# Patient Record
Sex: Male | Born: 1950 | Hispanic: No | Marital: Married | Smoking: Former smoker
Health system: Southern US, Community
[De-identification: ages and names within clinical notes are randomized; demographics above are authoritative.]

## PROBLEM LIST (undated history)

## (undated) DIAGNOSIS — D638 Anemia in other chronic diseases classified elsewhere: Secondary | ICD-10-CM

## (undated) DIAGNOSIS — K219 Gastro-esophageal reflux disease without esophagitis: Secondary | ICD-10-CM

## (undated) DIAGNOSIS — J189 Pneumonia, unspecified organism: Secondary | ICD-10-CM

## (undated) DIAGNOSIS — E7800 Pure hypercholesterolemia, unspecified: Secondary | ICD-10-CM

## (undated) DIAGNOSIS — E119 Type 2 diabetes mellitus without complications: Secondary | ICD-10-CM

## (undated) DIAGNOSIS — I10 Essential (primary) hypertension: Secondary | ICD-10-CM

## (undated) DIAGNOSIS — F0390 Unspecified dementia without behavioral disturbance: Secondary | ICD-10-CM

## (undated) DIAGNOSIS — I5032 Chronic diastolic (congestive) heart failure: Secondary | ICD-10-CM

## (undated) DIAGNOSIS — Z992 Dependence on renal dialysis: Secondary | ICD-10-CM

## (undated) DIAGNOSIS — N186 End stage renal disease: Secondary | ICD-10-CM

## (undated) DIAGNOSIS — M109 Gout, unspecified: Secondary | ICD-10-CM

## (undated) DIAGNOSIS — Z8711 Personal history of peptic ulcer disease: Secondary | ICD-10-CM

## (undated) DIAGNOSIS — Z9289 Personal history of other medical treatment: Secondary | ICD-10-CM

## (undated) DIAGNOSIS — Z8719 Personal history of other diseases of the digestive system: Secondary | ICD-10-CM

## (undated) HISTORY — PX: ARTERIOVENOUS GRAFT PLACEMENT: SUR1029

---

## 1997-11-25 ENCOUNTER — Ambulatory Visit (HOSPITAL_COMMUNITY): Admission: RE | Admit: 1997-11-25 | Discharge: 1997-11-25 | Payer: Self-pay | Admitting: Vascular Surgery

## 1998-11-25 ENCOUNTER — Encounter: Payer: Self-pay | Admitting: Vascular Surgery

## 1998-11-25 ENCOUNTER — Ambulatory Visit (HOSPITAL_COMMUNITY): Admission: RE | Admit: 1998-11-25 | Discharge: 1998-11-25 | Payer: Self-pay | Admitting: Vascular Surgery

## 1999-03-21 ENCOUNTER — Ambulatory Visit (HOSPITAL_COMMUNITY): Admission: RE | Admit: 1999-03-21 | Discharge: 1999-03-21 | Payer: Self-pay | Admitting: Vascular Surgery

## 1999-03-21 ENCOUNTER — Encounter: Payer: Self-pay | Admitting: Vascular Surgery

## 1999-04-13 ENCOUNTER — Encounter: Payer: Self-pay | Admitting: *Deleted

## 1999-04-13 ENCOUNTER — Ambulatory Visit (HOSPITAL_COMMUNITY): Admission: RE | Admit: 1999-04-13 | Discharge: 1999-04-13 | Payer: Self-pay | Admitting: *Deleted

## 2000-03-07 ENCOUNTER — Encounter: Payer: Self-pay | Admitting: Vascular Surgery

## 2000-03-07 ENCOUNTER — Ambulatory Visit (HOSPITAL_COMMUNITY): Admission: RE | Admit: 2000-03-07 | Discharge: 2000-03-07 | Payer: Self-pay | Admitting: Cardiothoracic Surgery

## 2000-03-08 ENCOUNTER — Ambulatory Visit (HOSPITAL_COMMUNITY): Admission: EM | Admit: 2000-03-08 | Discharge: 2000-03-08 | Payer: Self-pay | Admitting: Emergency Medicine

## 2000-03-08 ENCOUNTER — Encounter: Payer: Self-pay | Admitting: Vascular Surgery

## 2000-05-05 ENCOUNTER — Ambulatory Visit (HOSPITAL_COMMUNITY): Admission: RE | Admit: 2000-05-05 | Discharge: 2000-05-05 | Payer: Self-pay | Admitting: Vascular Surgery

## 2000-05-19 ENCOUNTER — Ambulatory Visit (HOSPITAL_COMMUNITY): Admission: RE | Admit: 2000-05-19 | Discharge: 2000-05-19 | Payer: Self-pay | Admitting: Nephrology

## 2000-05-19 ENCOUNTER — Encounter: Payer: Self-pay | Admitting: Nephrology

## 2000-10-26 ENCOUNTER — Emergency Department (HOSPITAL_COMMUNITY): Admission: EM | Admit: 2000-10-26 | Discharge: 2000-10-27 | Payer: Self-pay | Admitting: Emergency Medicine

## 2000-10-27 ENCOUNTER — Encounter: Payer: Self-pay | Admitting: Vascular Surgery

## 2000-10-27 ENCOUNTER — Ambulatory Visit (HOSPITAL_COMMUNITY): Admission: RE | Admit: 2000-10-27 | Discharge: 2000-10-27 | Payer: Self-pay | Admitting: Emergency Medicine

## 2001-02-09 ENCOUNTER — Ambulatory Visit (HOSPITAL_COMMUNITY): Admission: RE | Admit: 2001-02-09 | Discharge: 2001-02-09 | Payer: Self-pay | Admitting: Vascular Surgery

## 2001-02-11 ENCOUNTER — Encounter: Payer: Self-pay | Admitting: Vascular Surgery

## 2001-02-11 ENCOUNTER — Ambulatory Visit (HOSPITAL_COMMUNITY): Admission: RE | Admit: 2001-02-11 | Discharge: 2001-02-11 | Payer: Self-pay | Admitting: Vascular Surgery

## 2001-03-02 ENCOUNTER — Encounter: Payer: Self-pay | Admitting: Vascular Surgery

## 2001-03-02 ENCOUNTER — Ambulatory Visit (HOSPITAL_COMMUNITY): Admission: RE | Admit: 2001-03-02 | Discharge: 2001-03-02 | Payer: Self-pay | Admitting: Vascular Surgery

## 2001-03-13 ENCOUNTER — Ambulatory Visit (HOSPITAL_COMMUNITY): Admission: RE | Admit: 2001-03-13 | Discharge: 2001-03-13 | Payer: Self-pay | Admitting: Vascular Surgery

## 2001-04-27 ENCOUNTER — Ambulatory Visit (HOSPITAL_COMMUNITY): Admission: RE | Admit: 2001-04-27 | Discharge: 2001-04-27 | Payer: Self-pay | Admitting: *Deleted

## 2001-09-25 ENCOUNTER — Ambulatory Visit (HOSPITAL_COMMUNITY): Admission: RE | Admit: 2001-09-25 | Discharge: 2001-09-25 | Payer: Self-pay | Admitting: Internal Medicine

## 2001-12-29 ENCOUNTER — Encounter: Payer: Self-pay | Admitting: *Deleted

## 2001-12-29 ENCOUNTER — Ambulatory Visit (HOSPITAL_COMMUNITY): Admission: RE | Admit: 2001-12-29 | Discharge: 2001-12-29 | Payer: Self-pay | Admitting: *Deleted

## 2002-06-17 HISTORY — PX: KIDNEY TRANSPLANT: SHX239

## 2002-07-16 DIAGNOSIS — Z940 Kidney transplant status: Secondary | ICD-10-CM

## 2005-03-03 ENCOUNTER — Emergency Department: Payer: Self-pay | Admitting: Unknown Physician Specialty

## 2007-05-18 ENCOUNTER — Emergency Department: Payer: Self-pay | Admitting: Emergency Medicine

## 2007-06-01 ENCOUNTER — Ambulatory Visit: Payer: Self-pay

## 2007-07-11 ENCOUNTER — Other Ambulatory Visit: Payer: Self-pay

## 2007-07-11 ENCOUNTER — Inpatient Hospital Stay: Payer: Self-pay | Admitting: Internal Medicine

## 2008-05-14 ENCOUNTER — Inpatient Hospital Stay: Payer: Self-pay | Admitting: Specialist

## 2010-05-20 ENCOUNTER — Inpatient Hospital Stay: Payer: Self-pay | Admitting: Internal Medicine

## 2010-05-23 LAB — PATHOLOGY REPORT

## 2010-08-14 ENCOUNTER — Ambulatory Visit: Payer: Self-pay | Admitting: Unknown Physician Specialty

## 2011-10-21 ENCOUNTER — Ambulatory Visit: Payer: Self-pay | Admitting: Unknown Physician Specialty

## 2012-03-16 ENCOUNTER — Other Ambulatory Visit: Payer: Self-pay | Admitting: Family Medicine

## 2012-03-17 DIAGNOSIS — M109 Gout, unspecified: Secondary | ICD-10-CM | POA: Insufficient documentation

## 2012-03-17 DIAGNOSIS — E785 Hyperlipidemia, unspecified: Secondary | ICD-10-CM | POA: Insufficient documentation

## 2012-03-17 DIAGNOSIS — Z8719 Personal history of other diseases of the digestive system: Secondary | ICD-10-CM | POA: Insufficient documentation

## 2012-08-19 ENCOUNTER — Other Ambulatory Visit: Payer: Self-pay

## 2012-11-02 ENCOUNTER — Other Ambulatory Visit: Payer: Self-pay

## 2012-12-14 ENCOUNTER — Other Ambulatory Visit: Payer: Self-pay

## 2012-12-14 LAB — BASIC METABOLIC PANEL
Anion Gap: 10 (ref 7–16)
BUN: 87 mg/dL — ABNORMAL HIGH (ref 7–18)
Chloride: 102 mmol/L (ref 98–107)
Creatinine: 5.47 mg/dL — ABNORMAL HIGH (ref 0.60–1.30)
Glucose: 63 mg/dL — ABNORMAL LOW (ref 65–99)
Osmolality: 298 (ref 275–301)
Potassium: 3.5 mmol/L (ref 3.5–5.1)
Sodium: 137 mmol/L (ref 136–145)

## 2013-06-17 HISTORY — PX: NEPHRECTOMY TRANSPLANTED ORGAN: SUR880

## 2013-06-17 HISTORY — PX: THROMBECTOMY / ARTERIOVENOUS GRAFT REVISION: SUR1351

## 2013-06-22 ENCOUNTER — Other Ambulatory Visit: Payer: Self-pay

## 2013-08-01 ENCOUNTER — Emergency Department: Payer: Self-pay | Admitting: Emergency Medicine

## 2013-08-05 ENCOUNTER — Observation Stay: Payer: Self-pay | Admitting: Student

## 2013-08-05 LAB — URINALYSIS, COMPLETE
Bacteria: NONE SEEN
Bilirubin,UR: NEGATIVE
GLUCOSE, UR: NEGATIVE mg/dL (ref 0–75)
Hyaline Cast: 3
Ketone: NEGATIVE
LEUKOCYTE ESTERASE: NEGATIVE
Nitrite: NEGATIVE
PH: 5 (ref 4.5–8.0)
Protein: 30
SPECIFIC GRAVITY: 1.01 (ref 1.003–1.030)
Squamous Epithelial: NONE SEEN

## 2013-08-05 LAB — COMPREHENSIVE METABOLIC PANEL
ALK PHOS: 118 U/L — AB
Albumin: 4.5 g/dL (ref 3.4–5.0)
Anion Gap: 10 (ref 7–16)
BILIRUBIN TOTAL: 0.5 mg/dL (ref 0.2–1.0)
BUN: 113 mg/dL — AB (ref 7–18)
CHLORIDE: 101 mmol/L (ref 98–107)
CO2: 24 mmol/L (ref 21–32)
Calcium, Total: 9 mg/dL (ref 8.5–10.1)
Creatinine: 4.96 mg/dL — ABNORMAL HIGH (ref 0.60–1.30)
EGFR (Non-African Amer.): 12 — ABNORMAL LOW
GFR CALC AF AMER: 13 — AB
Glucose: 286 mg/dL — ABNORMAL HIGH (ref 65–99)
Osmolality: 316 (ref 275–301)
POTASSIUM: 4.4 mmol/L (ref 3.5–5.1)
SGOT(AST): 15 U/L (ref 15–37)
SGPT (ALT): 15 U/L (ref 12–78)
SODIUM: 135 mmol/L — AB (ref 136–145)
Total Protein: 8.9 g/dL — ABNORMAL HIGH (ref 6.4–8.2)

## 2013-08-05 LAB — CBC WITH DIFFERENTIAL/PLATELET
BASOS PCT: 0.2 %
Basophil #: 0 10*3/uL (ref 0.0–0.1)
EOS PCT: 0.1 %
Eosinophil #: 0 10*3/uL (ref 0.0–0.7)
HCT: 30.7 % — ABNORMAL LOW (ref 40.0–52.0)
HGB: 9.3 g/dL — ABNORMAL LOW (ref 13.0–18.0)
LYMPHS ABS: 0.7 10*3/uL — AB (ref 1.0–3.6)
Lymphocyte %: 8.3 %
MCH: 21 pg — AB (ref 26.0–34.0)
MCHC: 30.2 g/dL — ABNORMAL LOW (ref 32.0–36.0)
MCV: 70 fL — AB (ref 80–100)
MONO ABS: 0.3 x10 3/mm (ref 0.2–1.0)
Monocyte %: 3.2 %
NEUTROS ABS: 7.4 10*3/uL — AB (ref 1.4–6.5)
Neutrophil %: 88.2 %
Platelet: 275 10*3/uL (ref 150–440)
RBC: 4.42 10*6/uL (ref 4.40–5.90)
RDW: 15.3 % — ABNORMAL HIGH (ref 11.5–14.5)
WBC: 8.4 10*3/uL (ref 3.8–10.6)

## 2013-08-05 LAB — LIPASE, BLOOD: Lipase: 668 U/L — ABNORMAL HIGH (ref 73–393)

## 2013-08-06 LAB — CBC WITH DIFFERENTIAL/PLATELET
BASOS PCT: 0.3 %
Basophil #: 0 10*3/uL (ref 0.0–0.1)
EOS ABS: 0.1 10*3/uL (ref 0.0–0.7)
EOS PCT: 1.8 %
HCT: 24.1 % — ABNORMAL LOW (ref 40.0–52.0)
HGB: 7.6 g/dL — AB (ref 13.0–18.0)
Lymphocyte #: 1.3 10*3/uL (ref 1.0–3.6)
Lymphocyte %: 19.7 %
MCH: 21.8 pg — ABNORMAL LOW (ref 26.0–34.0)
MCHC: 31.4 g/dL — ABNORMAL LOW (ref 32.0–36.0)
MCV: 70 fL — AB (ref 80–100)
MONO ABS: 0.8 x10 3/mm (ref 0.2–1.0)
Monocyte %: 12 %
NEUTROS PCT: 66.2 %
Neutrophil #: 4.5 10*3/uL (ref 1.4–6.5)
Platelet: 211 10*3/uL (ref 150–440)
RBC: 3.47 10*6/uL — ABNORMAL LOW (ref 4.40–5.90)
RDW: 15.5 % — AB (ref 11.5–14.5)
WBC: 6.8 10*3/uL (ref 3.8–10.6)

## 2013-08-06 LAB — BASIC METABOLIC PANEL
Anion Gap: 9 (ref 7–16)
BUN: 106 mg/dL — AB (ref 7–18)
CHLORIDE: 109 mmol/L — AB (ref 98–107)
Calcium, Total: 7.6 mg/dL — ABNORMAL LOW (ref 8.5–10.1)
Co2: 22 mmol/L (ref 21–32)
Creatinine: 4.42 mg/dL — ABNORMAL HIGH (ref 0.60–1.30)
GFR CALC AF AMER: 15 — AB
GFR CALC NON AF AMER: 13 — AB
GLUCOSE: 103 mg/dL — AB (ref 65–99)
Osmolality: 313 (ref 275–301)
Potassium: 3.9 mmol/L (ref 3.5–5.1)
SODIUM: 140 mmol/L (ref 136–145)

## 2013-08-06 LAB — FERRITIN: Ferritin (ARMC): 293 ng/mL (ref 8–388)

## 2013-08-06 LAB — LIPID PANEL
Cholesterol: 103 mg/dL (ref 0–200)
HDL Cholesterol: 32 mg/dL — ABNORMAL LOW (ref 40–60)
Ldl Cholesterol, Calc: 33 mg/dL (ref 0–100)
Triglycerides: 191 mg/dL (ref 0–200)
VLDL Cholesterol, Calc: 38 mg/dL (ref 5–40)

## 2013-08-06 LAB — IRON AND TIBC
IRON BIND. CAP.(TOTAL): 317 ug/dL (ref 250–450)
Iron Saturation: 15 %
Iron: 48 ug/dL — ABNORMAL LOW (ref 65–175)
Unbound Iron-Bind.Cap.: 269 ug/dL

## 2013-08-06 LAB — LIPASE, BLOOD: LIPASE: 493 U/L — AB (ref 73–393)

## 2013-10-26 ENCOUNTER — Inpatient Hospital Stay: Payer: Self-pay | Admitting: Internal Medicine

## 2013-10-26 LAB — URINALYSIS, COMPLETE
BACTERIA: NONE SEEN
Bilirubin,UR: NEGATIVE
GLUCOSE, UR: NEGATIVE mg/dL (ref 0–75)
Hyaline Cast: 2
KETONE: NEGATIVE
Leukocyte Esterase: NEGATIVE
NITRITE: NEGATIVE
PROTEIN: NEGATIVE
Ph: 5 (ref 4.5–8.0)
SPECIFIC GRAVITY: 1.01 (ref 1.003–1.030)
Squamous Epithelial: <1
WBC UR: <1 /HPF (ref 0–5)

## 2013-10-26 LAB — COMPREHENSIVE METABOLIC PANEL
Albumin: 4 g/dL (ref 3.4–5.0)
Alkaline Phosphatase: 63 U/L
Anion Gap: 11 (ref 7–16)
BILIRUBIN TOTAL: 0.5 mg/dL (ref 0.2–1.0)
BUN: 116 mg/dL — ABNORMAL HIGH (ref 7–18)
CALCIUM: 8.6 mg/dL (ref 8.5–10.1)
CHLORIDE: 107 mmol/L (ref 98–107)
Co2: 20 mmol/L — ABNORMAL LOW (ref 21–32)
Creatinine: 5.04 mg/dL — ABNORMAL HIGH (ref 0.60–1.30)
EGFR (African American): 13 — ABNORMAL LOW
EGFR (Non-African Amer.): 11 — ABNORMAL LOW
GLUCOSE: 188 mg/dL — AB (ref 65–99)
Osmolality: 318 (ref 275–301)
Potassium: 3.8 mmol/L (ref 3.5–5.1)
SGOT(AST): 21 U/L (ref 15–37)
SGPT (ALT): 14 U/L (ref 12–78)
Sodium: 138 mmol/L (ref 136–145)
Total Protein: 7.4 g/dL (ref 6.4–8.2)

## 2013-10-26 LAB — OCCULT BLOOD X 1 CARD TO LAB, STOOL: OCCULT BLOOD, FECES: POSITIVE

## 2013-10-26 LAB — CBC WITH DIFFERENTIAL/PLATELET
BASOS PCT: 0.1 %
Basophil #: 0 10*3/uL (ref 0.0–0.1)
EOS ABS: 0 10*3/uL (ref 0.0–0.7)
EOS PCT: 0.3 %
HCT: 25.8 % — ABNORMAL LOW (ref 40.0–52.0)
HGB: 7.9 g/dL — ABNORMAL LOW (ref 13.0–18.0)
LYMPHS ABS: 0.4 10*3/uL — AB (ref 1.0–3.6)
LYMPHS PCT: 5.6 %
MCH: 21.2 pg — ABNORMAL LOW (ref 26.0–34.0)
MCHC: 30.4 g/dL — ABNORMAL LOW (ref 32.0–36.0)
MCV: 70 fL — ABNORMAL LOW (ref 80–100)
Monocyte #: 0.9 x10 3/mm (ref 0.2–1.0)
Monocyte %: 11.1 %
Neutrophil #: 6.4 10*3/uL (ref 1.4–6.5)
Neutrophil %: 82.9 %
PLATELETS: 229 10*3/uL (ref 150–440)
RBC: 3.71 10*6/uL — ABNORMAL LOW (ref 4.40–5.90)
RDW: 16.4 % — ABNORMAL HIGH (ref 11.5–14.5)
WBC: 7.8 10*3/uL (ref 3.8–10.6)

## 2013-10-26 LAB — IRON AND TIBC
IRON SATURATION: 5 %
Iron Bind.Cap.(Total): 297 ug/dL (ref 250–450)
Iron: 16 ug/dL — ABNORMAL LOW (ref 65–175)
Unbound Iron-Bind.Cap.: 281 ug/dL

## 2013-10-26 LAB — CLOSTRIDIUM DIFFICILE(ARMC)

## 2013-10-26 LAB — FOLATE: FOLIC ACID: 14 ng/mL (ref 3.1–100.0)

## 2013-10-26 LAB — LIPASE, BLOOD: LIPASE: 497 U/L — AB (ref 73–393)

## 2013-10-27 LAB — CBC WITH DIFFERENTIAL/PLATELET
Basophil #: 0 10*3/uL (ref 0.0–0.1)
Basophil %: 0.4 %
EOS PCT: 0.3 %
Eosinophil #: 0 10*3/uL (ref 0.0–0.7)
HCT: 23.1 % — AB (ref 40.0–52.0)
HGB: 6.9 g/dL — ABNORMAL LOW (ref 13.0–18.0)
LYMPHS ABS: 0.5 10*3/uL — AB (ref 1.0–3.6)
Lymphocyte %: 8.2 %
MCH: 20.9 pg — AB (ref 26.0–34.0)
MCHC: 30 g/dL — AB (ref 32.0–36.0)
MCV: 70 fL — AB (ref 80–100)
MONO ABS: 1 x10 3/mm (ref 0.2–1.0)
MONOS PCT: 17.2 %
Neutrophil #: 4.3 10*3/uL (ref 1.4–6.5)
Neutrophil %: 73.9 %
Platelet: 199 10*3/uL (ref 150–440)
RBC: 3.31 10*6/uL — AB (ref 4.40–5.90)
RDW: 16.1 % — ABNORMAL HIGH (ref 11.5–14.5)
WBC: 5.8 10*3/uL (ref 3.8–10.6)

## 2013-10-27 LAB — BASIC METABOLIC PANEL
Anion Gap: 9 (ref 7–16)
BUN: 110 mg/dL — ABNORMAL HIGH (ref 7–18)
Calcium, Total: 7.8 mg/dL — ABNORMAL LOW (ref 8.5–10.1)
Chloride: 110 mmol/L — ABNORMAL HIGH (ref 98–107)
Co2: 21 mmol/L (ref 21–32)
Creatinine: 5.09 mg/dL — ABNORMAL HIGH (ref 0.60–1.30)
EGFR (African American): 13 — ABNORMAL LOW
EGFR (Non-African Amer.): 11 — ABNORMAL LOW
GLUCOSE: 146 mg/dL — AB (ref 65–99)
OSMOLALITY: 317 (ref 275–301)
Potassium: 4 mmol/L (ref 3.5–5.1)
Sodium: 140 mmol/L (ref 136–145)

## 2013-10-28 LAB — CBC WITH DIFFERENTIAL/PLATELET
BASOS ABS: 0 10*3/uL (ref 0.0–0.1)
BASOS PCT: 0.3 %
EOS ABS: 0 10*3/uL (ref 0.0–0.7)
Eosinophil %: 0.7 %
HCT: 22.4 % — ABNORMAL LOW (ref 40.0–52.0)
HGB: 6.8 g/dL — ABNORMAL LOW (ref 13.0–18.0)
Lymphocyte #: 0.6 10*3/uL — ABNORMAL LOW (ref 1.0–3.6)
Lymphocyte %: 12.2 %
MCH: 21.3 pg — AB (ref 26.0–34.0)
MCHC: 30.4 g/dL — AB (ref 32.0–36.0)
MCV: 70 fL — ABNORMAL LOW (ref 80–100)
Monocyte #: 0.8 x10 3/mm (ref 0.2–1.0)
Monocyte %: 16.6 %
NEUTROS PCT: 70.2 %
Neutrophil #: 3.3 10*3/uL (ref 1.4–6.5)
Platelet: 182 10*3/uL (ref 150–440)
RBC: 3.21 10*6/uL — ABNORMAL LOW (ref 4.40–5.90)
RDW: 16.4 % — AB (ref 11.5–14.5)
WBC: 4.7 10*3/uL (ref 3.8–10.6)

## 2013-10-28 LAB — BASIC METABOLIC PANEL
ANION GAP: 8 (ref 7–16)
BUN: 100 mg/dL — ABNORMAL HIGH (ref 7–18)
CALCIUM: 7.8 mg/dL — AB (ref 8.5–10.1)
CHLORIDE: 116 mmol/L — AB (ref 98–107)
Co2: 19 mmol/L — ABNORMAL LOW (ref 21–32)
Creatinine: 5.19 mg/dL — ABNORMAL HIGH (ref 0.60–1.30)
EGFR (Non-African Amer.): 11 — ABNORMAL LOW
GFR CALC AF AMER: 13 — AB
Glucose: 125 mg/dL — ABNORMAL HIGH (ref 65–99)
Osmolality: 318 (ref 275–301)
POTASSIUM: 4 mmol/L (ref 3.5–5.1)
Sodium: 143 mmol/L (ref 136–145)

## 2013-10-28 LAB — IRON AND TIBC
IRON BIND. CAP.(TOTAL): 213 ug/dL — AB (ref 250–450)
Iron Saturation: 8 %
Iron: 17 ug/dL — ABNORMAL LOW (ref 65–175)
UNBOUND IRON-BIND. CAP.: 196 ug/dL

## 2013-10-28 LAB — FERRITIN: FERRITIN (ARMC): 209 ng/mL (ref 8–388)

## 2013-10-29 LAB — BASIC METABOLIC PANEL
Anion Gap: 7 (ref 7–16)
BUN: 75 mg/dL — ABNORMAL HIGH (ref 7–18)
CO2: 20 mmol/L — AB (ref 21–32)
Calcium, Total: 8 mg/dL — ABNORMAL LOW (ref 8.5–10.1)
Chloride: 114 mmol/L — ABNORMAL HIGH (ref 98–107)
Creatinine: 4.37 mg/dL — ABNORMAL HIGH (ref 0.60–1.30)
EGFR (African American): 16 — ABNORMAL LOW
GFR CALC NON AF AMER: 13 — AB
GLUCOSE: 157 mg/dL — AB (ref 65–99)
Osmolality: 307 (ref 275–301)
Potassium: 3.9 mmol/L (ref 3.5–5.1)
Sodium: 141 mmol/L (ref 136–145)

## 2013-10-31 LAB — CULTURE, BLOOD (SINGLE)

## 2014-02-15 HISTORY — PX: PERITONEAL CATHETER INSERTION: SHX2223

## 2014-02-28 ENCOUNTER — Ambulatory Visit: Payer: Self-pay | Admitting: Vascular Surgery

## 2014-02-28 DIAGNOSIS — Z01810 Encounter for preprocedural cardiovascular examination: Secondary | ICD-10-CM

## 2014-02-28 DIAGNOSIS — E119 Type 2 diabetes mellitus without complications: Secondary | ICD-10-CM

## 2014-02-28 LAB — BASIC METABOLIC PANEL
Anion Gap: 10 (ref 7–16)
BUN: 113 mg/dL — ABNORMAL HIGH (ref 7–18)
Calcium, Total: 8.1 mg/dL — ABNORMAL LOW (ref 8.5–10.1)
Chloride: 108 mmol/L — ABNORMAL HIGH (ref 98–107)
Co2: 24 mmol/L (ref 21–32)
Creatinine: 4.81 mg/dL — ABNORMAL HIGH (ref 0.60–1.30)
EGFR (African American): 14 — ABNORMAL LOW
EGFR (Non-African Amer.): 12 — ABNORMAL LOW
GLUCOSE: 154 mg/dL — AB (ref 65–99)
Osmolality: 322 (ref 275–301)
POTASSIUM: 3.7 mmol/L (ref 3.5–5.1)
SODIUM: 142 mmol/L (ref 136–145)

## 2014-02-28 LAB — CBC
HCT: 26.4 % — ABNORMAL LOW (ref 40.0–52.0)
HGB: 7.8 g/dL — ABNORMAL LOW (ref 13.0–18.0)
MCH: 21.3 pg — ABNORMAL LOW (ref 26.0–34.0)
MCHC: 29.6 g/dL — ABNORMAL LOW (ref 32.0–36.0)
MCV: 72 fL — AB (ref 80–100)
Platelet: 203 10*3/uL (ref 150–440)
RBC: 3.67 10*6/uL — ABNORMAL LOW (ref 4.40–5.90)
RDW: 16 % — ABNORMAL HIGH (ref 11.5–14.5)
WBC: 5.7 10*3/uL (ref 3.8–10.6)

## 2014-02-28 LAB — URINALYSIS, COMPLETE
Bacteria: NONE SEEN
Bilirubin,UR: NEGATIVE
Glucose,UR: NEGATIVE mg/dL (ref 0–75)
Ketone: NEGATIVE
Leukocyte Esterase: NEGATIVE
Nitrite: NEGATIVE
Ph: 5 (ref 4.5–8.0)
Protein: NEGATIVE
RBC,UR: 1 /HPF (ref 0–5)
SPECIFIC GRAVITY: 1.01 (ref 1.003–1.030)
SQUAMOUS EPITHELIAL: NONE SEEN

## 2014-02-28 LAB — PROTIME-INR
INR: 1.1
PROTHROMBIN TIME: 13.7 s (ref 11.5–14.7)

## 2014-02-28 LAB — MRSA PCR SCREENING

## 2014-03-04 ENCOUNTER — Ambulatory Visit: Payer: Self-pay | Admitting: Vascular Surgery

## 2014-03-10 ENCOUNTER — Other Ambulatory Visit: Payer: Self-pay | Admitting: Nephrology

## 2014-03-10 LAB — POTASSIUM: Potassium: 4.4 mmol/L (ref 3.5–5.1)

## 2014-03-14 ENCOUNTER — Ambulatory Visit: Payer: Self-pay | Admitting: Vascular Surgery

## 2014-03-24 ENCOUNTER — Emergency Department: Payer: Self-pay | Admitting: Emergency Medicine

## 2014-03-24 LAB — COMPREHENSIVE METABOLIC PANEL
Albumin: 3.5 g/dL (ref 3.4–5.0)
Alkaline Phosphatase: 75 U/L
Anion Gap: 12 (ref 7–16)
BUN: 12 mg/dL (ref 7–18)
Bilirubin,Total: 0.6 mg/dL (ref 0.2–1.0)
CHLORIDE: 97 mmol/L — AB (ref 98–107)
CREATININE: 2.6 mg/dL — AB (ref 0.60–1.30)
Calcium, Total: 7.4 mg/dL — ABNORMAL LOW (ref 8.5–10.1)
Co2: 28 mmol/L (ref 21–32)
EGFR (Non-African Amer.): 27 — ABNORMAL LOW
GFR CALC AF AMER: 32 — AB
GLUCOSE: 230 mg/dL — AB (ref 65–99)
OSMOLALITY: 281 (ref 275–301)
Potassium: 3.4 mmol/L — ABNORMAL LOW (ref 3.5–5.1)
SGOT(AST): 19 U/L (ref 15–37)
SGPT (ALT): 15 U/L
Sodium: 137 mmol/L (ref 136–145)
TOTAL PROTEIN: 6.8 g/dL (ref 6.4–8.2)

## 2014-03-24 LAB — CBC WITH DIFFERENTIAL/PLATELET
BASOS PCT: 0.2 %
Basophil #: 0 10*3/uL (ref 0.0–0.1)
Eosinophil #: 0.1 10*3/uL (ref 0.0–0.7)
Eosinophil %: 0.3 %
HCT: 27.9 % — ABNORMAL LOW (ref 40.0–52.0)
HGB: 8.2 g/dL — ABNORMAL LOW (ref 13.0–18.0)
LYMPHS PCT: 3.7 %
Lymphocyte #: 0.7 10*3/uL — ABNORMAL LOW (ref 1.0–3.6)
MCH: 20.9 pg — AB (ref 26.0–34.0)
MCHC: 29.3 g/dL — AB (ref 32.0–36.0)
MCV: 71 fL — ABNORMAL LOW (ref 80–100)
MONOS PCT: 11.3 %
Monocyte #: 2 x10 3/mm — ABNORMAL HIGH (ref 0.2–1.0)
NEUTROS ABS: 14.8 10*3/uL — AB (ref 1.4–6.5)
NEUTROS PCT: 84.5 %
Platelet: 222 10*3/uL (ref 150–440)
RBC: 3.91 10*6/uL — ABNORMAL LOW (ref 4.40–5.90)
RDW: 14.9 % — ABNORMAL HIGH (ref 11.5–14.5)
WBC: 17.5 10*3/uL — AB (ref 3.8–10.6)

## 2014-03-24 LAB — LIPASE, BLOOD: LIPASE: 209 U/L (ref 73–393)

## 2014-03-24 LAB — TROPONIN I: TROPONIN-I: 0.04 ng/mL

## 2014-03-25 DIAGNOSIS — D631 Anemia in chronic kidney disease: Secondary | ICD-10-CM

## 2014-03-25 DIAGNOSIS — R112 Nausea with vomiting, unspecified: Secondary | ICD-10-CM | POA: Insufficient documentation

## 2014-03-25 DIAGNOSIS — N189 Chronic kidney disease, unspecified: Secondary | ICD-10-CM | POA: Insufficient documentation

## 2014-03-25 LAB — TROPONIN I: TROPONIN-I: 0.04 ng/mL

## 2014-03-30 LAB — CULTURE, BLOOD (SINGLE)

## 2014-04-21 ENCOUNTER — Inpatient Hospital Stay: Payer: Self-pay | Admitting: Internal Medicine

## 2014-04-21 LAB — CBC
HCT: 27 % — ABNORMAL LOW (ref 40.0–52.0)
HGB: 8 g/dL — ABNORMAL LOW (ref 13.0–18.0)
MCH: 20.7 pg — ABNORMAL LOW (ref 26.0–34.0)
MCHC: 29.1 g/dL — ABNORMAL LOW (ref 32.0–36.0)
MCV: 71 fL — ABNORMAL LOW (ref 80–100)
Platelet: 244 10*3/uL (ref 150–440)
RBC: 3.82 10*6/uL — ABNORMAL LOW (ref 4.40–5.90)
RDW: 17.6 % — AB (ref 11.5–14.5)
WBC: 25.6 10*3/uL — ABNORMAL HIGH (ref 3.8–10.6)

## 2014-04-21 LAB — MAGNESIUM: Magnesium: 1.5 mg/dL — ABNORMAL LOW

## 2014-04-21 LAB — COMPREHENSIVE METABOLIC PANEL
AST: 22 U/L (ref 15–37)
Albumin: 2.9 g/dL — ABNORMAL LOW (ref 3.4–5.0)
Alkaline Phosphatase: 85 U/L
Anion Gap: 12 (ref 7–16)
BUN: 20 mg/dL — ABNORMAL HIGH (ref 7–18)
Bilirubin,Total: 0.7 mg/dL (ref 0.2–1.0)
CO2: 31 mmol/L (ref 21–32)
Calcium, Total: 8.1 mg/dL — ABNORMAL LOW (ref 8.5–10.1)
Chloride: 94 mmol/L — ABNORMAL LOW (ref 98–107)
Creatinine: 3.3 mg/dL — ABNORMAL HIGH (ref 0.60–1.30)
EGFR (African American): 25 — ABNORMAL LOW
GFR CALC NON AF AMER: 20 — AB
Glucose: 90 mg/dL (ref 65–99)
OSMOLALITY: 276 (ref 275–301)
Potassium: 3.4 mmol/L — ABNORMAL LOW (ref 3.5–5.1)
SGPT (ALT): 17 U/L
SODIUM: 137 mmol/L (ref 136–145)
TOTAL PROTEIN: 7.4 g/dL (ref 6.4–8.2)

## 2014-04-21 LAB — LIPASE, BLOOD: Lipase: 119 U/L (ref 73–393)

## 2014-04-21 LAB — CK TOTAL AND CKMB (NOT AT ARMC)
CK, Total: 48 U/L
CK-MB: <0.5 ng/mL — ABNORMAL LOW (ref 0.5–3.6)

## 2014-04-21 LAB — PHOSPHORUS: Phosphorus: 2.1 mg/dL — ABNORMAL LOW (ref 2.5–4.9)

## 2014-04-21 LAB — TROPONIN I: Troponin-I: 0.03 ng/mL

## 2014-04-22 LAB — BASIC METABOLIC PANEL
ANION GAP: 12 (ref 7–16)
BUN: 37 mg/dL — ABNORMAL HIGH (ref 7–18)
CALCIUM: 7.6 mg/dL — AB (ref 8.5–10.1)
CO2: 29 mmol/L (ref 21–32)
CREATININE: 4.7 mg/dL — AB (ref 0.60–1.30)
Chloride: 96 mmol/L — ABNORMAL LOW (ref 98–107)
EGFR (African American): 16 — ABNORMAL LOW
EGFR (Non-African Amer.): 13 — ABNORMAL LOW
Glucose: 198 mg/dL — ABNORMAL HIGH (ref 65–99)
Osmolality: 288 (ref 275–301)
Potassium: 4 mmol/L (ref 3.5–5.1)
SODIUM: 137 mmol/L (ref 136–145)

## 2014-04-22 LAB — CBC WITH DIFFERENTIAL/PLATELET
Basophil #: 0 10*3/uL (ref 0.0–0.1)
Basophil %: 0.1 %
Eosinophil #: 0 10*3/uL (ref 0.0–0.7)
Eosinophil %: 0 %
HCT: 27.2 % — ABNORMAL LOW (ref 40.0–52.0)
HGB: 8 g/dL — AB (ref 13.0–18.0)
LYMPHS PCT: 5 %
Lymphocyte #: 1.3 10*3/uL (ref 1.0–3.6)
MCH: 20.8 pg — ABNORMAL LOW (ref 26.0–34.0)
MCHC: 29.3 g/dL — ABNORMAL LOW (ref 32.0–36.0)
MCV: 71 fL — ABNORMAL LOW (ref 80–100)
Monocyte #: 0.6 x10 3/mm (ref 0.2–1.0)
Monocyte %: 2.5 %
NEUTROS ABS: 23.8 10*3/uL — AB (ref 1.4–6.5)
NEUTROS PCT: 92.4 %
Platelet: 220 10*3/uL (ref 150–440)
RBC: 3.83 10*6/uL — ABNORMAL LOW (ref 4.40–5.90)
RDW: 17.5 % — AB (ref 11.5–14.5)
WBC: 25.7 10*3/uL — AB (ref 3.8–10.6)

## 2014-04-23 ENCOUNTER — Ambulatory Visit (HOSPITAL_COMMUNITY)
Admission: AD | Admit: 2014-04-23 | Discharge: 2014-04-23 | Disposition: A | Discharge status: Home / Self Care | Payer: BC Managed Care – PPO | Source: Other Acute Inpatient Hospital | Attending: Internal Medicine | Admitting: Internal Medicine

## 2014-04-23 DIAGNOSIS — T8611 Kidney transplant rejection: Secondary | ICD-10-CM | POA: Insufficient documentation

## 2014-04-23 LAB — CBC WITH DIFFERENTIAL/PLATELET
BASOS PCT: 0 %
Basophil #: 0 10*3/uL (ref 0.0–0.1)
EOS PCT: 0 %
Eosinophil #: 0 10*3/uL (ref 0.0–0.7)
HCT: 22.2 % — ABNORMAL LOW (ref 40.0–52.0)
HGB: 6.6 g/dL — ABNORMAL LOW (ref 13.0–18.0)
LYMPHS ABS: 1 10*3/uL (ref 1.0–3.6)
Lymphocyte %: 4.8 %
MCH: 20.5 pg — ABNORMAL LOW (ref 26.0–34.0)
MCHC: 29.8 g/dL — ABNORMAL LOW (ref 32.0–36.0)
MCV: 69 fL — ABNORMAL LOW (ref 80–100)
Monocyte #: 0.6 x10 3/mm (ref 0.2–1.0)
Monocyte %: 2.9 %
NEUTROS PCT: 92.3 %
Neutrophil #: 18.7 10*3/uL — ABNORMAL HIGH (ref 1.4–6.5)
PLATELETS: 224 10*3/uL (ref 150–440)
RBC: 3.23 10*6/uL — ABNORMAL LOW (ref 4.40–5.90)
RDW: 17.4 % — ABNORMAL HIGH (ref 11.5–14.5)
WBC: 20.3 10*3/uL — AB (ref 3.8–10.6)

## 2014-04-23 LAB — RENAL FUNCTION PANEL
Albumin: 2.2 g/dL — ABNORMAL LOW (ref 3.4–5.0)
Anion Gap: 16 (ref 7–16)
BUN: 88 mg/dL — AB (ref 7–18)
CHLORIDE: 97 mmol/L — AB (ref 98–107)
CO2: 23 mmol/L (ref 21–32)
Calcium, Total: 7.1 mg/dL — ABNORMAL LOW (ref 8.5–10.1)
Creatinine: 7.33 mg/dL — ABNORMAL HIGH (ref 0.60–1.30)
EGFR (African American): 10 — ABNORMAL LOW
GFR CALC NON AF AMER: 8 — AB
GLUCOSE: 269 mg/dL — AB (ref 65–99)
OSMOLALITY: 308 (ref 275–301)
Phosphorus: 6.2 mg/dL — ABNORMAL HIGH (ref 2.5–4.9)
Potassium: 3.7 mmol/L (ref 3.5–5.1)
SODIUM: 136 mmol/L (ref 136–145)

## 2014-04-26 LAB — CULTURE, BLOOD (SINGLE)

## 2014-08-16 HISTORY — PX: PERITONEAL CATHETER REMOVAL: SUR1106

## 2014-08-23 ENCOUNTER — Inpatient Hospital Stay: Payer: Self-pay | Admitting: Internal Medicine

## 2014-09-27 LAB — PROTIME-INR
INR: 1
PROTHROMBIN TIME: 13.4 s

## 2014-09-27 LAB — CK TOTAL AND CKMB (NOT AT ARMC)
CK, TOTAL: 42 U/L — AB
CK, Total: 46 U/L — ABNORMAL LOW
CK, Total: 41 U/L — ABNORMAL LOW
CK-MB: 0.4 ng/mL — ABNORMAL LOW
CK-MB: 0.3 ng/mL — AB
CK-MB: 0.5 ng/mL

## 2014-09-27 LAB — CBC
HCT: 24.6 % — AB (ref 40.0–52.0)
HGB: 7.5 g/dL — AB (ref 13.0–18.0)
MCH: 21.4 pg — ABNORMAL LOW (ref 26.0–34.0)
MCHC: 30.5 g/dL — ABNORMAL LOW (ref 32.0–36.0)
MCV: 70 fL — ABNORMAL LOW (ref 80–100)
Platelet: 261 10*3/uL (ref 150–440)
RBC: 3.51 10*6/uL — ABNORMAL LOW (ref 4.40–5.90)
RDW: 21.3 % — ABNORMAL HIGH (ref 11.5–14.5)
WBC: 7.8 10*3/uL (ref 3.8–10.6)

## 2014-09-27 LAB — COMPREHENSIVE METABOLIC PANEL
ALK PHOS: 51 U/L
ANION GAP: 12 (ref 7–16)
AST: 17 U/L
Albumin: 2.9 g/dL — ABNORMAL LOW
BUN: 17 mg/dL
Bilirubin,Total: 0.8 mg/dL
CALCIUM: 9.5 mg/dL
CO2: 32 mmol/L
CREATININE: 8.78 mg/dL — AB
Chloride: 98 mmol/L — ABNORMAL LOW
EGFR (Non-African Amer.): 6 — ABNORMAL LOW
GFR CALC AF AMER: 7 — AB
Glucose: 85 mg/dL
Potassium: 3.4 mmol/L — ABNORMAL LOW
SGPT (ALT): 5 U/L — ABNORMAL LOW
Sodium: 142 mmol/L
Total Protein: 7 g/dL

## 2014-09-27 LAB — TROPONIN I
Troponin-I: 0.06 ng/mL — ABNORMAL HIGH
Troponin-I: 0.06 ng/mL — ABNORMAL HIGH
Troponin-I: 0.06 ng/mL — ABNORMAL HIGH

## 2014-09-27 LAB — LACTIC ACID, PLASMA: Lactic Acid, Venous: 1 mmol/L

## 2014-09-27 LAB — APTT: ACTIVATED PTT: 32.4 s (ref 23.6–35.9)

## 2014-09-28 LAB — CBC WITH DIFFERENTIAL/PLATELET
BASOS ABS: 0.3 10*3/uL — AB (ref 0.0–0.1)
Basophil %: 2.9 %
EOS ABS: 1.9 10*3/uL — AB (ref 0.0–0.7)
Eosinophil %: 19.4 %
HCT: 24.9 % — AB (ref 40.0–52.0)
HGB: 7.8 g/dL — ABNORMAL LOW (ref 13.0–18.0)
LYMPHS PCT: 31 %
Lymphocyte #: 3 10*3/uL (ref 1.0–3.6)
MCH: 21.8 pg — ABNORMAL LOW (ref 26.0–34.0)
MCHC: 31.5 g/dL — AB (ref 32.0–36.0)
MCV: 69 fL — AB (ref 80–100)
Monocyte #: 1.1 x10 3/mm — ABNORMAL HIGH (ref 0.2–1.0)
Monocyte %: 11.5 %
NEUTROS ABS: 3.4 10*3/uL (ref 1.4–6.5)
NEUTROS PCT: 35.2 %
Platelet: 254 10*3/uL (ref 150–440)
RBC: 3.6 10*6/uL — ABNORMAL LOW (ref 4.40–5.90)
RDW: 21.2 % — ABNORMAL HIGH (ref 11.5–14.5)
WBC: 9.8 10*3/uL (ref 3.8–10.6)

## 2014-09-28 LAB — BASIC METABOLIC PANEL
Anion Gap: 9 (ref 7–16)
BUN: 21 mg/dL — ABNORMAL HIGH
CALCIUM: 8.9 mg/dL
CO2: 32 mmol/L
CREATININE: 10.89 mg/dL — AB
Chloride: 100 mmol/L — ABNORMAL LOW
EGFR (African American): 5 — ABNORMAL LOW
EGFR (Non-African Amer.): 4 — ABNORMAL LOW
GLUCOSE: 85 mg/dL
POTASSIUM: 4 mmol/L
Sodium: 141 mmol/L

## 2014-09-28 LAB — PHOSPHORUS: Phosphorus: 3.1 mg/dL

## 2014-09-29 ENCOUNTER — Inpatient Hospital Stay (HOSPITAL_COMMUNITY): Payer: BLUE CROSS/BLUE SHIELD

## 2014-09-29 ENCOUNTER — Encounter (HOSPITAL_COMMUNITY): Payer: Self-pay | Admitting: General Practice

## 2014-09-29 ENCOUNTER — Inpatient Hospital Stay
Admit: 2014-09-29 | Disposition: A | Discharge status: Other Acute Inpatient Hospital | Payer: Self-pay | Attending: Internal Medicine | Admitting: Internal Medicine

## 2014-09-29 ENCOUNTER — Inpatient Hospital Stay (HOSPITAL_COMMUNITY)
Admission: AD | Admit: 2014-09-29 | Discharge: 2014-10-02 | DRG: 246 | Disposition: A | Discharge status: Home Health | Payer: BLUE CROSS/BLUE SHIELD | Source: Other Acute Inpatient Hospital | Attending: Cardiology | Admitting: Cardiology

## 2014-09-29 DIAGNOSIS — R451 Restlessness and agitation: Secondary | ICD-10-CM | POA: Diagnosis not present

## 2014-09-29 DIAGNOSIS — K219 Gastro-esophageal reflux disease without esophagitis: Secondary | ICD-10-CM | POA: Diagnosis present

## 2014-09-29 DIAGNOSIS — I120 Hypertensive chronic kidney disease with stage 5 chronic kidney disease or end stage renal disease: Secondary | ICD-10-CM | POA: Diagnosis present

## 2014-09-29 DIAGNOSIS — E780 Pure hypercholesterolemia: Secondary | ICD-10-CM | POA: Diagnosis present

## 2014-09-29 DIAGNOSIS — A499 Bacterial infection, unspecified: Secondary | ICD-10-CM | POA: Insufficient documentation

## 2014-09-29 DIAGNOSIS — D631 Anemia in chronic kidney disease: Secondary | ICD-10-CM | POA: Diagnosis present

## 2014-09-29 DIAGNOSIS — M1000 Idiopathic gout, unspecified site: Secondary | ICD-10-CM | POA: Diagnosis present

## 2014-09-29 DIAGNOSIS — Z87891 Personal history of nicotine dependence: Secondary | ICD-10-CM | POA: Diagnosis not present

## 2014-09-29 DIAGNOSIS — I25110 Atherosclerotic heart disease of native coronary artery with unstable angina pectoris: Principal | ICD-10-CM | POA: Diagnosis present

## 2014-09-29 DIAGNOSIS — Z7902 Long term (current) use of antithrombotics/antiplatelets: Secondary | ICD-10-CM | POA: Diagnosis not present

## 2014-09-29 DIAGNOSIS — R7881 Bacteremia: Secondary | ICD-10-CM | POA: Diagnosis present

## 2014-09-29 DIAGNOSIS — Z452 Encounter for adjustment and management of vascular access device: Secondary | ICD-10-CM

## 2014-09-29 DIAGNOSIS — R079 Chest pain, unspecified: Secondary | ICD-10-CM | POA: Diagnosis present

## 2014-09-29 DIAGNOSIS — Z91041 Radiographic dye allergy status: Secondary | ICD-10-CM

## 2014-09-29 DIAGNOSIS — I330 Acute and subacute infective endocarditis: Secondary | ICD-10-CM | POA: Diagnosis present

## 2014-09-29 DIAGNOSIS — A491 Streptococcal infection, unspecified site: Secondary | ICD-10-CM | POA: Insufficient documentation

## 2014-09-29 DIAGNOSIS — B955 Unspecified streptococcus as the cause of diseases classified elsewhere: Secondary | ICD-10-CM | POA: Diagnosis present

## 2014-09-29 DIAGNOSIS — E785 Hyperlipidemia, unspecified: Secondary | ICD-10-CM | POA: Diagnosis present

## 2014-09-29 DIAGNOSIS — I339 Acute and subacute endocarditis, unspecified: Secondary | ICD-10-CM | POA: Insufficient documentation

## 2014-09-29 DIAGNOSIS — Z940 Kidney transplant status: Secondary | ICD-10-CM

## 2014-09-29 DIAGNOSIS — Z992 Dependence on renal dialysis: Secondary | ICD-10-CM

## 2014-09-29 DIAGNOSIS — I249 Acute ischemic heart disease, unspecified: Secondary | ICD-10-CM | POA: Diagnosis not present

## 2014-09-29 DIAGNOSIS — R410 Disorientation, unspecified: Secondary | ICD-10-CM | POA: Diagnosis not present

## 2014-09-29 DIAGNOSIS — N186 End stage renal disease: Secondary | ICD-10-CM | POA: Diagnosis present

## 2014-09-29 DIAGNOSIS — E1022 Type 1 diabetes mellitus with diabetic chronic kidney disease: Secondary | ICD-10-CM | POA: Diagnosis present

## 2014-09-29 DIAGNOSIS — Z7982 Long term (current) use of aspirin: Secondary | ICD-10-CM

## 2014-09-29 DIAGNOSIS — Z79899 Other long term (current) drug therapy: Secondary | ICD-10-CM | POA: Diagnosis not present

## 2014-09-29 DIAGNOSIS — R0602 Shortness of breath: Secondary | ICD-10-CM

## 2014-09-29 DIAGNOSIS — E1029 Type 1 diabetes mellitus with other diabetic kidney complication: Secondary | ICD-10-CM | POA: Diagnosis present

## 2014-09-29 DIAGNOSIS — Z794 Long term (current) use of insulin: Secondary | ICD-10-CM | POA: Diagnosis not present

## 2014-09-29 DIAGNOSIS — B9689 Other specified bacterial agents as the cause of diseases classified elsewhere: Secondary | ICD-10-CM | POA: Diagnosis not present

## 2014-09-29 DIAGNOSIS — I39 Endocarditis and heart valve disorders in diseases classified elsewhere: Secondary | ICD-10-CM | POA: Insufficient documentation

## 2014-09-29 DIAGNOSIS — Y649 Contaminated medical or biological substance administered by unspecified means: Secondary | ICD-10-CM | POA: Insufficient documentation

## 2014-09-29 HISTORY — DX: Essential (primary) hypertension: I10

## 2014-09-29 HISTORY — DX: End stage renal disease: N18.6

## 2014-09-29 HISTORY — DX: Anemia in other chronic diseases classified elsewhere: D63.8

## 2014-09-29 HISTORY — DX: Personal history of other medical treatment: Z92.89

## 2014-09-29 HISTORY — DX: Gastro-esophageal reflux disease without esophagitis: K21.9

## 2014-09-29 HISTORY — DX: Personal history of other diseases of the digestive system: Z87.19

## 2014-09-29 HISTORY — PX: CARDIAC CATHETERIZATION: SHX172

## 2014-09-29 HISTORY — DX: Gout, unspecified: M10.9

## 2014-09-29 HISTORY — DX: Pure hypercholesterolemia, unspecified: E78.00

## 2014-09-29 HISTORY — DX: Personal history of peptic ulcer disease: Z87.11

## 2014-09-29 HISTORY — DX: Type 2 diabetes mellitus without complications: E11.9

## 2014-09-29 HISTORY — DX: Dependence on renal dialysis: Z99.2

## 2014-09-29 LAB — PREPARE RBC (CROSSMATCH)

## 2014-09-29 LAB — GLUCOSE, CAPILLARY: Glucose-Capillary: 193 mg/dL — ABNORMAL HIGH (ref 70–99)

## 2014-09-29 LAB — PHOSPHORUS: PHOSPHORUS: 1.6 mg/dL — AB

## 2014-09-29 MED ORDER — SODIUM CHLORIDE 0.9 % IJ SOLN: 3.0000 mL | INTRAMUSCULAR | Status: DC | PRN

## 2014-09-29 MED ORDER — SODIUM CHLORIDE 0.9 % IV SOLN
INTRAVENOUS | Status: DC
  Administered 2014-09-30: 06:00:00 via INTRAVENOUS

## 2014-09-29 MED ORDER — ASPIRIN 81 MG PO CHEW
81.0000 mg | CHEWABLE_TABLET | ORAL | Status: AC
  Administered 2014-09-30: 81 mg via ORAL

## 2014-09-29 MED ORDER — RENA-VITE PO TABS
1.0000 | ORAL_TABLET | Freq: Every day | ORAL | Status: DC
  Administered 2014-09-29 – 2014-10-01 (×3): 1 via ORAL
  Filled 2014-09-29 (×4): qty 1

## 2014-09-29 MED ORDER — CLOPIDOGREL BISULFATE 75 MG PO TABS
300.0000 mg | ORAL_TABLET | Freq: Once | ORAL | Status: AC
  Administered 2014-09-29: 300 mg via ORAL
  Filled 2014-09-29: qty 4

## 2014-09-29 MED ORDER — ONDANSETRON HCL 4 MG/2ML IJ SOLN: 4.0000 mg | Freq: Four times a day (QID) | INTRAMUSCULAR | Status: DC | PRN

## 2014-09-29 MED ORDER — METOPROLOL TARTRATE 25 MG PO TABS
25.0000 mg | ORAL_TABLET | Freq: Two times a day (BID) | ORAL | Status: DC
  Administered 2014-09-29 – 2014-09-30 (×3): 25 mg via ORAL
  Filled 2014-09-29 (×5): qty 1

## 2014-09-29 MED ORDER — NITROGLYCERIN IN D5W 200-5 MCG/ML-% IV SOLN
5.0000 ug/min | INTRAVENOUS | Status: DC
  Administered 2014-09-29: 5 ug/min via INTRAVENOUS
  Filled 2014-09-29: qty 250

## 2014-09-29 MED ORDER — ASPIRIN EC 81 MG PO TBEC
81.0000 mg | DELAYED_RELEASE_TABLET | Freq: Every day | ORAL | Status: DC
  Administered 2014-09-30: 81 mg via ORAL
  Filled 2014-09-29: qty 1

## 2014-09-29 MED ORDER — HEPARIN (PORCINE) IN NACL 100-0.45 UNIT/ML-% IJ SOLN
700.0000 [IU]/h | INTRAMUSCULAR | Status: DC
  Administered 2014-09-30: 700 [IU]/h via INTRAVENOUS
  Filled 2014-09-29: qty 250

## 2014-09-29 MED ORDER — INSULIN ASPART 100 UNIT/ML ~~LOC~~ SOLN
0.0000 [IU] | Freq: Three times a day (TID) | SUBCUTANEOUS | Status: DC
  Administered 2014-09-30 – 2014-10-01 (×2): 1 [IU] via SUBCUTANEOUS
  Administered 2014-10-01 – 2014-10-02 (×2): 2 [IU] via SUBCUTANEOUS

## 2014-09-29 MED ORDER — ASPIRIN 81 MG PO CHEW
324.0000 mg | CHEWABLE_TABLET | ORAL | Status: AC
  Filled 2014-09-29: qty 4

## 2014-09-29 MED ORDER — ACETAMINOPHEN 325 MG PO TABS
650.0000 mg | ORAL_TABLET | ORAL | Status: DC | PRN
  Administered 2014-09-30: 650 mg via ORAL
  Filled 2014-09-29: qty 2

## 2014-09-29 MED ORDER — HEPARIN BOLUS VIA INFUSION
3500.0000 [IU] | Freq: Once | INTRAVENOUS | Status: AC
  Administered 2014-09-30: 3500 [IU] via INTRAVENOUS
  Filled 2014-09-29: qty 3500

## 2014-09-29 MED ORDER — SODIUM CHLORIDE 0.9 % IV SOLN
INTRAVENOUS | Status: DC
  Administered 2014-09-29: 22:00:00 via INTRAVENOUS

## 2014-09-29 MED ORDER — NITROGLYCERIN 0.4 MG SL SUBL: 0.4000 mg | SUBLINGUAL_TABLET | SUBLINGUAL | Status: DC | PRN

## 2014-09-29 MED ORDER — SODIUM CHLORIDE 0.9 % IV SOLN: 250.0000 mL | INTRAVENOUS | Status: DC | PRN

## 2014-09-29 MED ORDER — CALCIUM ACETATE (PHOS BINDER) 667 MG PO CAPS
667.0000 mg | ORAL_CAPSULE | Freq: Three times a day (TID) | ORAL | Status: DC
  Administered 2014-09-30 – 2014-10-02 (×5): 667 mg via ORAL
  Filled 2014-09-29 (×13): qty 1

## 2014-09-29 MED ORDER — PANTOPRAZOLE SODIUM 40 MG PO TBEC
40.0000 mg | DELAYED_RELEASE_TABLET | Freq: Every day | ORAL | Status: DC
  Administered 2014-09-29 – 2014-10-02 (×3): 40 mg via ORAL
  Filled 2014-09-29 (×4): qty 1

## 2014-09-29 MED ORDER — SODIUM CHLORIDE 0.9 % IJ SOLN
3.0000 mL | Freq: Two times a day (BID) | INTRAMUSCULAR | Status: DC
  Administered 2014-09-29: 3 mL via INTRAVENOUS

## 2014-09-29 MED ORDER — ATORVASTATIN CALCIUM 40 MG PO TABS
40.0000 mg | ORAL_TABLET | Freq: Every day | ORAL | Status: DC
  Administered 2014-09-29 – 2014-10-01 (×3): 40 mg via ORAL
  Filled 2014-09-29 (×4): qty 1

## 2014-09-29 MED ORDER — SODIUM CHLORIDE 0.9 % IV SOLN
Freq: Once | INTRAVENOUS | Status: AC
  Administered 2014-09-30: 02:00:00 via INTRAVENOUS

## 2014-09-29 MED ORDER — ASPIRIN 300 MG RE SUPP: 300.0000 mg | RECTAL | Status: AC

## 2014-09-29 MED ORDER — VANCOMYCIN HCL IN DEXTROSE 1-5 GM/200ML-% IV SOLN
1000.0000 mg | Freq: Once | INTRAVENOUS | Status: AC
  Administered 2014-09-29: 1000 mg via INTRAVENOUS
  Filled 2014-09-29: qty 200

## 2014-09-29 NOTE — Progress Notes (Signed)
ANTICOAGULATION CONSULT NOTE - Initial Consult  Pharmacy Consult for Heparin Indication: chest pain/ACS  Allergies not on file  Patient Measurements: Height: 5' (152.4 cm) Weight: 130 lb 8 oz (59.194 kg) IBW/kg (Calculated) : 50 Heparin Dosing Weight: 59 kg  Vital Signs:    Labs: No results for input(s): HGB, HCT, PLT, APTT, LABPROT, INR, HEPARINUNFRC, CREATININE, CKTOTAL, CKMB, TROPONINI in the last 72 hours.  CrCl cannot be calculated (Unknown ideal weight.).   Medical History: No past medical history on file.  Medications:  No prescriptions prior to admission   Scheduled:  . sodium chloride   Intravenous Once  . aspirin  324 mg Oral NOW   Or  . aspirin  300 mg Rectal NOW  . [START ON 09/30/2014] aspirin  81 mg Oral Pre-Cath  . [START ON 09/30/2014] aspirin EC  81 mg Oral Daily  . atorvastatin  40 mg Oral q1800  . [START ON 09/30/2014] calcium acetate  667 mg Oral TID WC  . clopidogrel  300 mg Oral Once  . [START ON 09/30/2014] insulin aspart  0-9 Units Subcutaneous TID WC  . metoprolol tartrate  25 mg Oral BID  . multivitamin  1 tablet Oral QHS  . pantoprazole  40 mg Oral Q0600  . sodium chloride  3 mL Intravenous Q12H   Infusions:  . sodium chloride    . [START ON 09/30/2014] sodium chloride    . nitroGLYCERIN      Assessment: 64yo male with history of HTN, DM2, HLD, ESRD on HD transferred in from [facility] for cath tomorrow. Pharmacy is consulted to dose heparin for ACS/chest pain. Labs are pending  Goal of Therapy:  Heparin level 0.3-0.7 units/ml Monitor platelets by anticoagulation protocol: Yes   Plan:  Give 3500 units bolus x 1 Start heparin infusion at 700 units/hr Check anti-Xa level in 6 hours and daily while on heparin Continue to monitor H&H and platelets  Andrey Cota. Diona Foley, PharmD Clinical Pharmacist Pager [phone] 09/29/2014,8:41 PM

## 2014-09-29 NOTE — H&P (Signed)
[name] is an 64 y.o. male.   Chief Complaint: Chest pain with minimally elevated troponin I HPI: Patient is 64 year old male with past medical history significant for hypertension, non-insulin-dependent diabetes mellitus controlled by diet, hyperlipidemia, end-stage renal disease on hemodialysis, anemia of chronic disease, gouty arthritis, was admitted at [name] on 09/27/2014 because of chest pain which woke him up grade 5/10 radiating to the back associated with shortness of breath diaphoresis and dizziness. Patient was noted to a minimally elevated troponin I subsequently underwent Lexiscan Myoview which showed lateral and inferior wall ischemia with EF of 55% patient subsequently underwent cardiac catheterization today which showed approximately 40% distal left main stenosis and critical ostial LAD stenosis with ruptured plaque and 60-70% proximal LAD stenosis. Patient is transferred here for PCI to LAD. Patient presently denies chest pain but had jaw pain while being transferred from [name]. Denies any shortness of breath. Denies palpitation lightheadedness or syncope. Of note patient had peritoneal dialysis in the past developed peritonitis and subsequently had C. difficile otitis for which he was treated adequately.  No past medical history on file.  No past surgical history on file.  No family history on file. Social History:  has no tobacco, alcohol, and drug history on file.  Allergies: Allergies not on file  No prescriptions prior to admission    No results found for this or any previous visit (from the past 48 hour(s)). No results found.  Review of Systems  Constitutional: Negative for fever and chills.  Eyes: Negative for double vision and photophobia.  Respiratory: Positive for shortness of breath.   Cardiovascular: Positive for chest pain and orthopnea. Negative for palpitations, claudication and leg swelling.  Gastrointestinal:  Negative for nausea and vomiting.  Genitourinary: Negative for dysuria.  Neurological: Negative for dizziness and headaches.    There were no vitals taken for this visit. Physical Exam  Constitutional: He is oriented to person, place, and time.  HENT:  Head: Normocephalic and atraumatic.  Eyes: Conjunctivae are normal. Left eye exhibits no discharge. No scleral icterus.  Neck: Neck supple. No tracheal deviation present. No thyromegaly present.  Cardiovascular: Normal rate and regular rhythm.   Murmur (2/6 systolic murmur noted) heard. Respiratory: Effort normal and breath sounds normal. No respiratory distress. He has no wheezes. He has no rales.  GI: Soft. Bowel sounds are normal. He exhibits no distension. There is no tenderness. There is no rebound.  Musculoskeletal: He exhibits no edema or tenderness.  Neurological: He is alert and oriented to person, place, and time.     Assessment/Plan Acute coronary syndrome Status post left cardiac cath at [name] with critical ostial LAD stenosis with ruptured plaque with TIMI 3 distal flow. Hypertension Diabetes mellitus End-stage renal disease on hemodialysis Hypercholesteremia History of gouty arthritis Anemia of chronic disease Plan As per orders Type and crossmatch and transfuse one unit of packed RBC Discussed with patient and family at length regarding PTCA stenting its risk and benefits i.e. death MI stroke need for emergency CABG local vascular complications etc. and consents for PCI  [name] N 09/29/2014, 8:16 PM

## 2014-09-29 NOTE — Progress Notes (Signed)
ANTIBIOTIC CONSULT NOTE - INITIAL  Pharmacy Consult for Vancomycin Indication: bacteremia  Allergies not on file  Patient Measurements: Height: 5' (152.4 cm) Weight: 130 lb 8 oz (59.194 kg) IBW/kg (Calculated) : 50 Adjusted Body Weight:   Vital Signs:   Intake/Output from previous day:   Intake/Output from this shift:    Labs: No results for input(s): WBC, HGB, PLT, LABCREA, CREATININE in the last 72 hours. CrCl cannot be calculated (Patient has no serum creatinine result on file.). No results for input(s): VANCOTROUGH, VANCOPEAK, VANCORANDOM, GENTTROUGH, GENTPEAK, GENTRANDOM, TOBRATROUGH, TOBRAPEAK, TOBRARND, AMIKACINPEAK, AMIKACINTROU, AMIKACIN in the last 72 hours.   Microbiology: No results found for this or any previous visit (from the past 720 hour(s)).  Medical History: No past medical history on file.  Medications:  No prescriptions prior to admission   Scheduled:  . sodium chloride   Intravenous Once  . aspirin  324 mg Oral NOW   Or  . aspirin  300 mg Rectal NOW  . [START ON 09/30/2014] aspirin  81 mg Oral Pre-Cath  . [START ON 09/30/2014] aspirin EC  81 mg Oral Daily  . atorvastatin  40 mg Oral q1800  . [START ON 09/30/2014] calcium acetate  667 mg Oral TID WC  . clopidogrel  300 mg Oral Once  . [START ON 09/30/2014] insulin aspart  0-9 Units Subcutaneous TID WC  . metoprolol tartrate  25 mg Oral BID  . multivitamin  1 tablet Oral QHS  . pantoprazole  40 mg Oral Q0600  . sodium chloride  3 mL Intravenous Q12H   Infusions:  . sodium chloride    . [START ON 09/30/2014] sodium chloride    . nitroGLYCERIN     Assessment: 64yo male found to [place] in 2/2 blood cultures from [facility]. Pharmacy is consulted to dose vancomycin for bacteremia. Labs are pending.  Goal of Therapy:  Vancomycin trough level 15-20 mcg/ml  Plan:  Vancomycin 1g IV once Redose antibiotics when labs return Measure antibiotic drug levels at steady state Follow up culture results, renal  function, and clinical course  Andrey Cota. Diona Foley, PharmD Clinical Pharmacist Pager [phone] 09/29/2014,8:52 PM

## 2014-09-30 ENCOUNTER — Ambulatory Visit (HOSPITAL_COMMUNITY): Admission: AD | Admit: 2014-09-30 | Payer: Self-pay | Source: Ambulatory Visit | Admitting: Cardiology

## 2014-09-30 ENCOUNTER — Inpatient Hospital Stay (HOSPITAL_COMMUNITY): Payer: BLUE CROSS/BLUE SHIELD

## 2014-09-30 ENCOUNTER — Encounter (HOSPITAL_COMMUNITY)
Admission: AD | Disposition: A | Discharge status: Home Health | Payer: Medicare Other | Source: Other Acute Inpatient Hospital | Attending: Cardiology

## 2014-09-30 ENCOUNTER — Encounter (HOSPITAL_COMMUNITY): Payer: Self-pay | Admitting: Cardiology

## 2014-09-30 DIAGNOSIS — E1029 Type 1 diabetes mellitus with other diabetic kidney complication: Secondary | ICD-10-CM | POA: Insufficient documentation

## 2014-09-30 DIAGNOSIS — B9689 Other specified bacterial agents as the cause of diseases classified elsewhere: Secondary | ICD-10-CM

## 2014-09-30 DIAGNOSIS — I330 Acute and subacute infective endocarditis: Secondary | ICD-10-CM

## 2014-09-30 DIAGNOSIS — A499 Bacterial infection, unspecified: Secondary | ICD-10-CM | POA: Insufficient documentation

## 2014-09-30 DIAGNOSIS — N186 End stage renal disease: Secondary | ICD-10-CM | POA: Insufficient documentation

## 2014-09-30 DIAGNOSIS — R7881 Bacteremia: Secondary | ICD-10-CM

## 2014-09-30 DIAGNOSIS — I39 Endocarditis and heart valve disorders in diseases classified elsewhere: Secondary | ICD-10-CM | POA: Insufficient documentation

## 2014-09-30 DIAGNOSIS — R0602 Shortness of breath: Secondary | ICD-10-CM | POA: Insufficient documentation

## 2014-09-30 DIAGNOSIS — I249 Acute ischemic heart disease, unspecified: Secondary | ICD-10-CM

## 2014-09-30 DIAGNOSIS — I339 Acute and subacute endocarditis, unspecified: Secondary | ICD-10-CM | POA: Insufficient documentation

## 2014-09-30 DIAGNOSIS — A491 Streptococcal infection, unspecified site: Secondary | ICD-10-CM | POA: Insufficient documentation

## 2014-09-30 DIAGNOSIS — Z992 Dependence on renal dialysis: Secondary | ICD-10-CM

## 2014-09-30 HISTORY — PX: PERCUTANEOUS CORONARY STENT INTERVENTION (PCI-S): SHX5485

## 2014-09-30 LAB — TROPONIN I
Troponin I: 0.05 ng/mL — ABNORMAL HIGH (ref <0.031)
Troponin I: 0.06 ng/mL — ABNORMAL HIGH (ref <0.031)
Troponin I: 0.06 ng/mL — ABNORMAL HIGH (ref <0.031)

## 2014-09-30 LAB — CBC WITH DIFFERENTIAL/PLATELET
BASOS ABS: 0 10*3/uL (ref 0.0–0.1)
Basophils Relative: 0 % (ref 0–1)
Eosinophils Absolute: 0 10*3/uL (ref 0.0–0.7)
Eosinophils Relative: 0 % (ref 0–5)
HCT: 26.4 % — ABNORMAL LOW (ref 39.0–52.0)
Hemoglobin: 8 g/dL — ABNORMAL LOW (ref 13.0–17.0)
LYMPHS ABS: 1.4 10*3/uL (ref 0.7–4.0)
LYMPHS PCT: 32 % (ref 12–46)
MCH: 21.2 pg — ABNORMAL LOW (ref 26.0–34.0)
MCHC: 30.3 g/dL (ref 30.0–36.0)
MCV: 70 fL — ABNORMAL LOW (ref 78.0–100.0)
MONOS PCT: 2 % — AB (ref 3–12)
Monocytes Absolute: 0.1 10*3/uL (ref 0.1–1.0)
Neutro Abs: 2.9 10*3/uL (ref 1.7–7.7)
Neutrophils Relative %: 66 % (ref 43–77)
PLATELETS: 238 10*3/uL (ref 150–400)
RBC: 3.77 MIL/uL — ABNORMAL LOW (ref 4.22–5.81)
RDW: 20.3 % — AB (ref 11.5–15.5)
WBC: 4.4 10*3/uL (ref 4.0–10.5)

## 2014-09-30 LAB — LIPID PANEL
CHOL/HDL RATIO: 3.8 ratio
Cholesterol: 115 mg/dL (ref 0–200)
HDL: 30 mg/dL — AB (ref >39)
LDL CALC: 69 mg/dL (ref 0–99)
TRIGLYCERIDES: 79 mg/dL (ref <150)
VLDL: 16 mg/dL (ref 0–40)

## 2014-09-30 LAB — CBC
HCT: 28.9 % — ABNORMAL LOW (ref 39.0–52.0)
HCT: 30.2 % — ABNORMAL LOW (ref 39.0–52.0)
Hemoglobin: 9.1 g/dL — ABNORMAL LOW (ref 13.0–17.0)
Hemoglobin: 9.6 g/dL — ABNORMAL LOW (ref 13.0–17.0)
MCH: 22.4 pg — AB (ref 26.0–34.0)
MCH: 22.4 pg — ABNORMAL LOW (ref 26.0–34.0)
MCHC: 31.5 g/dL (ref 30.0–36.0)
MCHC: 31.8 g/dL (ref 30.0–36.0)
MCV: 71 fL — ABNORMAL LOW (ref 78.0–100.0)
MCV: 70.6 fL — ABNORMAL LOW (ref 78.0–100.0)
PLATELETS: 233 10*3/uL (ref 150–400)
Platelets: 237 10*3/uL (ref 150–400)
RBC: 4.07 MIL/uL — AB (ref 4.22–5.81)
RBC: 4.28 MIL/uL (ref 4.22–5.81)
RDW: 19.7 % — ABNORMAL HIGH (ref 11.5–15.5)
RDW: 19.6 % — ABNORMAL HIGH (ref 11.5–15.5)
WBC: 5.1 10*3/uL (ref 4.0–10.5)
WBC: 5.3 10*3/uL (ref 4.0–10.5)

## 2014-09-30 LAB — BASIC METABOLIC PANEL
Anion gap: 17 — ABNORMAL HIGH (ref 5–15)
BUN: 20 mg/dL (ref 6–23)
CALCIUM: 8.6 mg/dL (ref 8.4–10.5)
CO2: 25 mmol/L (ref 19–32)
CREATININE: 4.78 mg/dL — AB (ref 0.50–1.35)
Chloride: 92 mmol/L — ABNORMAL LOW (ref 96–112)
GFR calc non Af Amer: 12 mL/min — ABNORMAL LOW (ref >90)
GFR, EST AFRICAN AMERICAN: 14 mL/min — AB (ref >90)
GLUCOSE: 152 mg/dL — AB (ref 70–99)
Potassium: 4.6 mmol/L (ref 3.5–5.1)
Sodium: 134 mmol/L — ABNORMAL LOW (ref 135–145)

## 2014-09-30 LAB — COMPREHENSIVE METABOLIC PANEL
ALK PHOS: 58 U/L (ref 39–117)
ALT: 8 U/L (ref 0–53)
AST: 15 U/L (ref 0–37)
Albumin: 2.8 g/dL — ABNORMAL LOW (ref 3.5–5.2)
Anion gap: 20 — ABNORMAL HIGH (ref 5–15)
BILIRUBIN TOTAL: 1.5 mg/dL — AB (ref 0.3–1.2)
BUN: 11 mg/dL (ref 6–23)
CO2: 21 mmol/L (ref 19–32)
CREATININE: 4.09 mg/dL — AB (ref 0.50–1.35)
Calcium: 8.4 mg/dL (ref 8.4–10.5)
Chloride: 93 mmol/L — ABNORMAL LOW (ref 96–112)
GFR calc Af Amer: 16 mL/min — ABNORMAL LOW (ref >90)
GFR calc non Af Amer: 14 mL/min — ABNORMAL LOW (ref >90)
GLUCOSE: 194 mg/dL — AB (ref 70–99)
POTASSIUM: 4.6 mmol/L (ref 3.5–5.1)
Sodium: 134 mmol/L — ABNORMAL LOW (ref 135–145)
TOTAL PROTEIN: 7.7 g/dL (ref 6.0–8.3)

## 2014-09-30 LAB — POCT ACTIVATED CLOTTING TIME: ACTIVATED CLOTTING TIME: 356 s

## 2014-09-30 LAB — PROTIME-INR
INR: 1.21 (ref 0.00–1.49)
Prothrombin Time: 15.4 seconds — ABNORMAL HIGH (ref 11.6–15.2)

## 2014-09-30 LAB — GLUCOSE, CAPILLARY: GLUCOSE-CAPILLARY: 139 mg/dL — AB (ref 70–99)

## 2014-09-30 LAB — ABO/RH: ABO/RH(D): O POS

## 2014-09-30 LAB — MRSA PCR SCREENING: MRSA by PCR: NEGATIVE

## 2014-09-30 SURGERY — PERCUTANEOUS CORONARY STENT INTERVENTION (PCI-S)
Anesthesia: LOCAL

## 2014-09-30 MED ORDER — ACETAMINOPHEN 325 MG PO TABS: 650.0000 mg | ORAL_TABLET | ORAL | Status: DC | PRN

## 2014-09-30 MED ORDER — ONDANSETRON HCL 4 MG/2ML IJ SOLN: 4.0000 mg | Freq: Four times a day (QID) | INTRAMUSCULAR | Status: DC | PRN

## 2014-09-30 MED ORDER — LORAZEPAM 0.5 MG PO TABS
0.5000 mg | ORAL_TABLET | Freq: Three times a day (TID) | ORAL | Status: DC | PRN
  Administered 2014-09-30: 0.5 mg via ORAL
  Filled 2014-09-30: qty 1

## 2014-09-30 MED ORDER — NITROGLYCERIN 0.2 MG/ML ON CALL CATH LAB
INTRAVENOUS | Status: AC
  Filled 2014-09-30: qty 1

## 2014-09-30 MED ORDER — ATROPINE SULFATE 0.1 MG/ML IJ SOLN
INTRAMUSCULAR | Status: AC
  Filled 2014-09-30: qty 10

## 2014-09-30 MED ORDER — SODIUM CHLORIDE 0.9 % IV SOLN
INTRAVENOUS | Status: DC
  Administered 2014-09-30: 14:00:00 via INTRAVENOUS

## 2014-09-30 MED ORDER — METHYLPREDNISOLONE SODIUM SUCC 125 MG IJ SOLR
INTRAMUSCULAR | Status: AC
  Filled 2014-09-30: qty 2

## 2014-09-30 MED ORDER — LORAZEPAM 2 MG/ML IJ SOLN
INTRAMUSCULAR | Status: AC
  Filled 2014-09-30: qty 1

## 2014-09-30 MED ORDER — CEFTRIAXONE SODIUM IN DEXTROSE 40 MG/ML IV SOLN
2.0000 g | INTRAVENOUS | Status: DC
  Filled 2014-09-30: qty 50

## 2014-09-30 MED ORDER — DIPHENHYDRAMINE HCL 50 MG/ML IJ SOLN
INTRAMUSCULAR | Status: AC
  Filled 2014-09-30: qty 1

## 2014-09-30 MED ORDER — BIVALIRUDIN 250 MG IV SOLR
INTRAVENOUS | Status: AC
  Filled 2014-09-30: qty 250

## 2014-09-30 MED ORDER — HEPARIN (PORCINE) IN NACL 2-0.9 UNIT/ML-% IJ SOLN
INTRAMUSCULAR | Status: AC
  Filled 2014-09-30: qty 1000

## 2014-09-30 MED ORDER — LINEZOLID 600 MG PO TABS
600.0000 mg | ORAL_TABLET | Freq: Two times a day (BID) | ORAL | Status: DC
  Filled 2014-09-30 (×2): qty 1

## 2014-09-30 MED ORDER — CLOPIDOGREL BISULFATE 75 MG PO TABS
ORAL_TABLET | ORAL | Status: AC
  Filled 2014-09-30: qty 2

## 2014-09-30 MED ORDER — CLOPIDOGREL BISULFATE 75 MG PO TABS
75.0000 mg | ORAL_TABLET | Freq: Every day | ORAL | Status: DC
  Administered 2014-10-01 – 2014-10-02 (×2): 75 mg via ORAL
  Filled 2014-09-30 (×3): qty 1

## 2014-09-30 MED ORDER — VANCOMYCIN HCL 500 MG IV SOLR
500.0000 mg | INTRAVENOUS | Status: DC
  Administered 2014-10-01: 500 mg via INTRAVENOUS
  Filled 2014-09-30 (×2): qty 500

## 2014-09-30 MED ORDER — LORAZEPAM 2 MG/ML IJ SOLN
1.0000 mg | Freq: Four times a day (QID) | INTRAMUSCULAR | Status: DC | PRN
  Administered 2014-09-30: 1 mg via INTRAVENOUS
  Filled 2014-09-30: qty 1

## 2014-09-30 MED ORDER — CEFTRIAXONE SODIUM IN DEXTROSE 40 MG/ML IV SOLN
2.0000 g | INTRAVENOUS | Status: DC
  Administered 2014-09-30: 2 g via INTRAVENOUS
  Filled 2014-09-30 (×2): qty 50

## 2014-09-30 MED ORDER — LIDOCAINE HCL (PF) 1 % IJ SOLN
INTRAMUSCULAR | Status: AC
  Filled 2014-09-30: qty 30

## 2014-09-30 MED ORDER — FAMOTIDINE IN NACL 20-0.9 MG/50ML-% IV SOLN
INTRAVENOUS | Status: AC
  Filled 2014-09-30: qty 50

## 2014-09-30 MED ORDER — LORAZEPAM 2 MG/ML IJ SOLN: 1.0000 mg | Freq: Four times a day (QID) | INTRAMUSCULAR | Status: DC

## 2014-09-30 MED ORDER — ASPIRIN 81 MG PO CHEW
81.0000 mg | CHEWABLE_TABLET | Freq: Every day | ORAL | Status: DC
  Administered 2014-10-01 – 2014-10-02 (×2): 81 mg via ORAL
  Filled 2014-09-30 (×2): qty 1

## 2014-09-30 NOTE — CV Procedure (Signed)
PTCA stenting report dictated on 09/30/2014 dictation number is [id number]

## 2014-09-30 NOTE — Consult Note (Signed)
[facility]    Date of Admission:  09/29/2014  Date of Consult:  09/30/2014  Reason for Consult: streptococcal bacteremia Referring Physician: Dr. Sharyn Lull   HPI: [name] is an 64 y.o. male w ESRD on HD, DM with renal complications who presented to [facility] with chest pressure, found to have + troponins, who had blood cultures drawn at [facility] --> growing Viridans Streptococci + possibly two additional streptococcal species from 2/2/ blood cultures on 09/27/14 (drawn in the ED) who underwent cardiac cath 40% distal left main stenosis and critical ostial LAD stenosis with ruptured plaque and 60-70% proximal LAD stenosis. Patient is transferred here for PCI to LAD.   He was found to have murmur as well and was taken to cath lab for stenting PTCA today.   He receives HD via AV LUE graft (previously had been on PD but developed Peritonitis complicated by clostridium difficile colitis)       Past Medical History  Diagnosis Date  . Chronic disease anemia   . Hypertension   . High cholesterol   . Type II diabetes mellitus   . History of blood transfusion     "related to anemia"  . GERD (gastroesophageal reflux disease)   . History of stomach ulcers   . Gout   . ESRD (end stage renal disease) on dialysis     "[facility] [place]; [name]" (09/29/2014)    Past Surgical History  Procedure Laterality Date  . Kidney transplant Right 2004  . Peritoneal catheter insertion  02/2014  . Peritoneal catheter removal  08/2014  . Arteriovenous graft placement Left ~ 1996  . Thrombectomy / arteriovenous graft revision  2015  . Nephrectomy transplanted organ  2015  . Cardiac catheterization  09/29/2014    "[place]"  ergies:   Allergies  Allergen Reactions  . Ivp Dye [Iodinated Diagnostic Agents]      Medications: I have reviewed patients current medications as documented in Epic Anti-infectives    Start     Dose/Rate Route Frequency Ordered Stop   10/01/14 1200   vancomycin (VANCOCIN) 500 mg in sodium chloride 0.9 % 100 mL IVPB     500 mg 100 mL/hr over 60 Minutes Intravenous Every T-Th-Sa (Hemodialysis) 09/30/14 0058     09/30/14 1000  linezolid (ZYVOX) tablet 600 mg  Status:  Discontinued     600 mg Oral Every 12 hours 09/30/14 0947 09/30/14 1038   09/30/14 0900  cefTRIAXone (ROCEPHIN) 2 g in dextrose 5 % 50 mL IVPB - Premix  Status:  Discontinued     2 g 100 mL/hr over 30 Minutes Intravenous Every 24 hours 09/30/14 0808 09/30/14 1105   09/29/14 2100  vancomycin (VANCOCIN) IVPB 1000 mg/200 mL premix     1,000 mg 200 mL/hr over 60 Minutes Intravenous  Once 09/29/14 2056 09/29/14 2329      Social History:  reports that he has quit smoking. His smoking use included Cigarettes. He has never used smokeless tobacco. He reports that he does not drink alcohol or use illicit drugs.  History reviewed. No pertinent family history.  As in HPI and primary teams notes otherwise 12 point review of systems is negative  Blood pressure 148/69, pulse 63, temperature 98.5 F (36.9 C), temperature source Axillary, resp. rate 17, height 5' (1.524 m), weight 135 lb 1.6 oz (61.281 kg), SpO2 100 %. General: Alert and awake, oriented x3, not in any acute distress. HEENT: anicteric sclera, pupils reactive to light and accommodation, EOMI, oropharynx  clear and without exudate CVS regular rate, normal r,  2/6 systolic murmur Chest: clear to auscultation bilaterally, no wheezing, rales or rhonchi Abdomen: soft nontender, nondistended, normal bowel sounds, Extremities: no  clubbing or edema noted bilaterally Skin:    Neuro: nonfocal, strength and sensation intact   Results for orders placed or performed during the hospital encounter of 09/29/14 (from the past 48 hour(s))  Prepare RBC     Status: None   Collection Time: 09/29/14  8:41 PM  Result Value Ref Range   Order Confirmation ORDER PROCESSED BY BLOOD BANK   Comprehensive metabolic panel     Status: Abnormal    Collection Time: 09/29/14 11:10 PM  Result Value Ref Range   Sodium 134 (L) 135 - 145 mmol/L   Potassium 4.6 3.5 - 5.1 mmol/L   Chloride 93 (L) 96 - 112 mmol/L   CO2 21 19 - 32 mmol/L   Glucose, Bld 194 (H) 70 - 99 mg/dL   BUN 11 6 - 23 mg/dL   Creatinine, Ser 4.09 (H) 0.50 - 1.35 mg/dL   Calcium 8.4 8.4 - 10.5 mg/dL   Total Protein 7.7 6.0 - 8.3 g/dL   Albumin 2.8 (L) 3.5 - 5.2 g/dL   AST 15 0 - 37 U/L   ALT 8 0 - 53 U/L   Alkaline Phosphatase 58 39 - 117 U/L   Total Bilirubin 1.5 (H) 0.3 - 1.2 mg/dL   GFR calc non Af Amer 14 (L) >90 mL/min   GFR calc Af Amer 16 (L) >90 mL/min    Comment: (NOTE) The eGFR has been calculated using the CKD EPI equation. This calculation has not been validated in all clinical situations. eGFR's persistently <90 mL/min signify possible Chronic Kidney Disease.    Anion gap 20 (H) 5 - 15  Troponin I-(serum)     Status: Abnormal   Collection Time: 09/29/14 11:10 PM  Result Value Ref Range   Troponin I 0.05 (H) <0.031 ng/mL    Comment:        PERSISTENTLY INCREASED TROPONIN VALUES IN THE RANGE OF 0.04-0.49 ng/mL CAN BE SEEN IN:       -UNSTABLE ANGINA       -CONGESTIVE HEART FAILURE       -MYOCARDITIS       -CHEST TRAUMA       -ARRYHTHMIAS       -LATE PRESENTING MYOCARDIAL INFARCTION       -COPD   CLINICAL FOLLOW-UP RECOMMENDED.   CBC WITH DIFFERENTIAL     Status: Abnormal   Collection Time: 09/29/14 11:10 PM  Result Value Ref Range   WBC 4.4 4.0 - 10.5 K/uL   RBC 3.77 (L) 4.22 - 5.81 MIL/uL   Hemoglobin 8.0 (L) 13.0 - 17.0 g/dL   HCT 26.4 (L) 39.0 - 52.0 %   MCV 70.0 (L) 78.0 - 100.0 fL   MCH 21.2 (L) 26.0 - 34.0 pg   MCHC 30.3 30.0 - 36.0 g/dL   RDW 20.3 (H) 11.5 - 15.5 %   Platelets 238 150 - 400 K/uL    Comment: PLATELET COUNT CONFIRMED BY SMEAR   Neutrophils Relative % 66 43 - 77 %   Lymphocytes Relative 32 12 - 46 %   Monocytes Relative 2 (L) 3 - 12 %   Eosinophils Relative 0 0 - 5 %   Basophils Relative 0 0 - 1 %   Neutro  Abs 2.9 1.7 - 7.7 K/uL   Lymphs Abs 1.4 0.7 - 4.0  K/uL   Monocytes Absolute 0.1 0.1 - 1.0 K/uL   Eosinophils Absolute 0.0 0.0 - 0.7 K/uL   Basophils Absolute 0.0 0.0 - 0.1 K/uL   RBC Morphology POLYCHROMASIA PRESENT     Comment: TARGET CELLS TEARDROP CELLS ELLIPTOCYTES   Type and screen     Status: None (Preliminary result)   Collection Time: 09/29/14 11:10 PM  Result Value Ref Range   ABO/RH(D) O POS    Antibody Screen NEG    Sample Expiration 10/02/2014    Unit Number [id number]    Blood Component Type RED CELLS,LR    Unit division 00    Status of Unit ISSUED    Transfusion Status OK TO TRANSFUSE    Crossmatch Result Compatible   ABO/Rh     Status: None   Collection Time: 09/29/14 11:10 PM  Result Value Ref Range   ABO/RH(D) O POS   Glucose, capillary     Status: Abnormal   Collection Time: 09/29/14 11:16 PM  Result Value Ref Range   Glucose-Capillary 193 (H) 70 - 99 mg/dL  Troponin I-(serum)     Status: Abnormal   Collection Time: 09/30/14  6:40 AM  Result Value Ref Range   Troponin I 0.06 (H) <0.031 ng/mL    Comment:        PERSISTENTLY INCREASED TROPONIN VALUES IN THE RANGE OF 0.04-0.49 ng/mL CAN BE SEEN IN:       -UNSTABLE ANGINA       -CONGESTIVE HEART FAILURE       -MYOCARDITIS       -CHEST TRAUMA       -ARRYHTHMIAS       -LATE PRESENTING MYOCARDIAL INFARCTION       -COPD   CLINICAL FOLLOW-UP RECOMMENDED.   Lipid panel     Status: Abnormal   Collection Time: 09/30/14  6:40 AM  Result Value Ref Range   Cholesterol 115 0 - 200 mg/dL   Triglycerides 79 <150 mg/dL   HDL 30 (L) >39 mg/dL   Total CHOL/HDL Ratio 3.8 RATIO   VLDL 16 0 - 40 mg/dL   LDL Cholesterol 69 0 - 99 mg/dL    Comment:        Total Cholesterol/HDL:CHD Risk Coronary Heart Disease Risk Table                     Men   Women  1/2 Average Risk   3.4   3.3  Average Risk       5.0   4.4  2 X Average Risk   9.6   7.1  3 X Average Risk  23.4   11.0        Use the calculated Patient  Ratio above and the CHD Risk Table to determine the patient's CHD Risk.        ATP III CLASSIFICATION (LDL):  <100     mg/dL   Optimal  100-129  mg/dL   Near or Above                    Optimal  130-159  mg/dL   Borderline  160-189  mg/dL   High  >190     mg/dL   Very High   CBC     Status: Abnormal   Collection Time: 09/30/14  6:40 AM  Result Value Ref Range   WBC 5.1 4.0 - 10.5 K/uL   RBC 4.07 (L) 4.22 - 5.81 MIL/uL   Hemoglobin 9.1 (L) 13.0 -  17.0 g/dL   HCT 28.9 (L) 39.0 - 52.0 %   MCV 71.0 (L) 78.0 - 100.0 fL   MCH 22.4 (L) 26.0 - 34.0 pg   MCHC 31.5 30.0 - 36.0 g/dL   RDW 19.7 (H) 11.5 - 15.5 %   Platelets 237 150 - 400 K/uL    Comment: PLATELET COUNT CONFIRMED BY SMEAR  Protime-INR     Status: Abnormal   Collection Time: 09/30/14  6:40 AM  Result Value Ref Range   Prothrombin Time 15.4 (H) 11.6 - 15.2 seconds   INR 1.21 0.00 - 1.49  MRSA PCR Screening     Status: None   Collection Time: 09/30/14  6:48 AM  Result Value Ref Range   MRSA by PCR NEGATIVE NEGATIVE    Comment:        The GeneXpert MRSA Assay (FDA approved for NASAL specimens only), is one component of a comprehensive MRSA colonization surveillance program. It is not intended to diagnose MRSA infection nor to guide or monitor treatment for MRSA infections.   Glucose, capillary     Status: Abnormal   Collection Time: 09/30/14  7:57 AM  Result Value Ref Range   Glucose-Capillary 139 (H) 70 - 99 mg/dL  Troponin I-(serum)     Status: Abnormal   Collection Time: 09/30/14  8:23 AM  Result Value Ref Range   Troponin I 0.06 (H) <0.031 ng/mL    Comment:        PERSISTENTLY INCREASED TROPONIN VALUES IN THE RANGE OF 0.04-0.49 ng/mL CAN BE SEEN IN:       -UNSTABLE ANGINA       -CONGESTIVE HEART FAILURE       -MYOCARDITIS       -CHEST TRAUMA       -ARRYHTHMIAS       -LATE PRESENTING MYOCARDIAL INFARCTION       -COPD   CLINICAL FOLLOW-UP RECOMMENDED.   Basic metabolic panel     Status: Abnormal    Collection Time: 09/30/14  8:23 AM  Result Value Ref Range   Sodium 134 (L) 135 - 145 mmol/L   Potassium 4.6 3.5 - 5.1 mmol/L   Chloride 92 (L) 96 - 112 mmol/L   CO2 25 19 - 32 mmol/L   Glucose, Bld 152 (H) 70 - 99 mg/dL   BUN 20 6 - 23 mg/dL   Creatinine, Ser 4.78 (H) 0.50 - 1.35 mg/dL   Calcium 8.6 8.4 - 10.5 mg/dL   GFR calc non Af Amer 12 (L) >90 mL/min   GFR calc Af Amer 14 (L) >90 mL/min    Comment: (NOTE) The eGFR has been calculated using the CKD EPI equation. This calculation has not been validated in all clinical situations. eGFR's persistently <90 mL/min signify possible Chronic Kidney Disease.    Anion gap 17 (H) 5 - 15  CBC     Status: Abnormal   Collection Time: 09/30/14  9:30 AM  Result Value Ref Range   WBC 5.3 4.0 - 10.5 K/uL   RBC 4.28 4.22 - 5.81 MIL/uL   Hemoglobin 9.6 (L) 13.0 - 17.0 g/dL   HCT 30.2 (L) 39.0 - 52.0 %   MCV 70.6 (L) 78.0 - 100.0 fL   MCH 22.4 (L) 26.0 - 34.0 pg   MCHC 31.8 30.0 - 36.0 g/dL   RDW 19.6 (H) 11.5 - 15.5 %   Platelets 233 150 - 400 K/uL  POCT Activated clotting time     Status: None   Collection Time:  09/30/14 12:16 PM  Result Value Ref Range   Activated Clotting Time 356 seconds   '@BRIEFLABTABLE'$ (sdes,specrequest,cult,reptstatus)   ) Recent Results (from the past 720 hour(s))  MRSA PCR Screening     Status: None   Collection Time: 09/30/14  6:48 AM  Result Value Ref Range Status   MRSA by PCR NEGATIVE NEGATIVE Final    Comment:        The GeneXpert MRSA Assay (FDA approved for NASAL specimens only), is one component of a comprehensive MRSA colonization surveillance program. It is not intended to diagnose MRSA infection nor to guide or monitor treatment for MRSA infections.      Impression/Recommendation  Active Problems:   Acute coronary syndrome   [name] is a 64 y.o. male with  Streptococcal possibly polymicrobial bacteremia and likely endocarditis with MI and critical stenosis sp PTCA here  #1  Streptococcal endocarditis:  --I continued vancomycin this am because he had HD access but no peripheral access --now that he has peripheral access again will start ROcephin --I believe he did receive a few doses of vancomycin at [facility] prior to arrival --will followup ID and [place] of strep species --he will need TEE to evaluate his heart valves --hopefully his new stent will not have become infected (this is NOT common event with bacteremia) --check panorex, and may consider CT abdomen to look for source of possible polymicrobial bacteremia    #2 Screening: screen for HIV hepatitis B and C  #3 Patient is c/o problems urinating  but given he is on HD this is likely a manifestation of his ESRD   I spent greater than 70  minutes with the patient including greater than 50% of time in face to face counsel of the patient via [place] re his bloodstream infection and re nature of infection and need for long term abx  and in coordination of their care.    09/30/2014, 5:45 PM   Thank you so much for this interesting consult  [place] [facility] [place] [phone] (pager) [phone] (office) 09/30/2014, 5:45 PM  Rhina [place] 09/30/2014, 5:45 PM

## 2014-09-30 NOTE — Progress Notes (Signed)
Dr. Terrence Dupont paged regarding pt's lack of IV access. Pt currently has Right Femoral sheath in place. Per nephrology, they do not want pt to have PICC placed in order to save his veins for future  AV graft access. Dr. Terrence Dupont to call CCM for consult to place IJ.

## 2014-09-30 NOTE — Progress Notes (Signed)
eLink Physician-Brief Progress Note Patient Name: [name] DOB: 1950/07/15 MRN: [id number]   Date of Service  09/30/2014  HPI/Events of Note  L IJ central venous catheter tip at mid SVC at level of brachiocephalic vein. No pneumothorax.  eICU Interventions  OK to use L IJ central venous catheter for fluid and medication administration.     Intervention Category Intermediate Interventions: Diagnostic test evaluation  Lysle Dingwall 09/30/2014, 4:50 PM

## 2014-09-30 NOTE — Progress Notes (Signed)
INITIAL NUTRITION ASSESSMENT  DOCUMENTATION CODES Per approved criteria  -Not Applicable   INTERVENTION: Encourage PO once diet advances  NUTRITION DIAGNOSIS: Inadequate oral intake related to poor appetite as evidenced by 13% weight loss in 2 month.   Goal: Pt to meet >/=90% estimated nutrition needs  Monitor:  Diet advancement, PO, supplement intake, labs, weight trends  Reason for Assessment: Malnutrition Screening Tool  64 y.o. male  Admitting Dx: <principal problem not specified>  ASSESSMENT: Patient is 64 year old male with past medical history significant for hypertension, non-insulin-dependent diabetes mellitus controlled by diet, hyperlipidemia, end-stage renal disease on hemodialysis, anemia of chronic disease, gouty arthritis.   History obtained from family and caregivers at bedside. Pt has had long history of renal issues, including kidney transplant in 2004. Pt has previously been on peritoneal dialysis but is currently on hemodialysis. Per family his appetite has been poor in the last 2 month, as was PO, which led to recent weight loss (usuall weight is about 150 Lb). Due to edema it was difficult to assess fat and muscle wasting. Pt states he does not like nutritional supplements. Labs reviewed: Na 134, Cl 92, Cr 4.78, Glu 152  Nutrition Focused Physical Exam:  Subcutaneous Fat:  Orbital Region: WDL Upper Arm Region: unable to determine due to edema Thoracic and Lumbar Region: n/a  Muscle:  Temple Region: mild depletion Clavicle Bone Region: WDL Clavicle and Acromion Bone Region: WDL Scapular Bone Region: WDL Dorsal Hand: mild depletion Patellar Region: mild depletion Anterior Thigh Region: mild to moderate depletion Posterior Calf Region: moderate depletion  Edema: arms  Height: Ht Readings from Last 1 Encounters:  09/29/14 5' (1.524 m)    Weight: Wt Readings from Last 1 Encounters:  09/29/14 130 lb 8 oz (59.194 kg)    Ideal Body Weight: 106  Lb  % Ideal Body Weight: 122%  Wt Readings from Last 10 Encounters:  09/29/14 130 lb 8 oz (59.194 kg)    Usual Body Weight: 150 Lb (per family)  % Usual Body Weight: 87%  BMI:  Body mass index is 25.49 kg/(m^2).  Estimated Nutritional Needs: Kcal: 1750 - 1950 Protein: 80 - 90 g Fluid: per MD  Skin: RT FA infiltration  Diet Order: Diet NPO time specified Except for: Sips with Meds  EDUCATION NEEDS: -No education needs identified at this time   Intake/Output Summary (Last 24 hours) at 09/30/14 1213 Last data filed at 09/30/14 0826  Gross per 24 hour  Intake    812 ml  Output      1 ml  Net    811 ml    Last BM: PTA (constipation)   Labs:   Recent Labs Lab 09/29/14 2310 09/30/14 0823  NA 134* 134*  K 4.6 4.6  CL 93* 92*  CO2 21 25  BUN 11 20  CREATININE 4.09* 4.78*  CALCIUM 8.4 8.6  GLUCOSE 194* 152*    CBG (last 3)   Recent Labs  09/29/14 2316 09/30/14 0757  GLUCAP 193* 139*    Scheduled Meds: . aspirin  324 mg Oral NOW   Or  . aspirin  300 mg Rectal NOW  . [MAR Hold] aspirin EC  81 mg Oral Daily  . [MAR Hold] atorvastatin  40 mg Oral q1800  . [MAR Hold] calcium acetate  667 mg Oral TID WC  . [MAR Hold] insulin aspart  0-9 Units Subcutaneous TID WC  . [MAR Hold] metoprolol tartrate  25 mg Oral BID  . [MAR Hold] multivitamin  1  tablet Oral QHS  . [MAR Hold] pantoprazole  40 mg Oral Q0600  . sodium chloride  3 mL Intravenous Q12H  . [MAR Hold] vancomycin  500 mg Intravenous Q T,Th,Sa-HD    Continuous Infusions: . sodium chloride Stopped (09/30/14 0746)  . sodium chloride Stopped (09/30/14 0746)  . heparin Stopped (09/30/14 0746)  . [MAR Hold] nitroGLYCERIN Stopped (09/30/14 0746)    Past Medical History  Diagnosis Date  . Chronic disease anemia   . Hypertension   . High cholesterol   . Type II diabetes mellitus   . History of blood transfusion     "related to anemia"  . GERD (gastroesophageal reflux disease)   . History of  stomach ulcers   . Gout   . ESRD (end stage renal disease) on dialysis     "[place]; [place]; [name]" (09/29/2014)    Past Surgical History  Procedure Laterality Date  . Kidney transplant Right 2004  . Peritoneal catheter insertion  02/2014  . Peritoneal catheter removal  08/2014  . Arteriovenous graft placement Left ~ 1996  . Thrombectomy / arteriovenous graft revision  2015  . Nephrectomy transplanted organ  2015  . Cardiac catheterization  09/29/2014    "[place]"    Caya Soberanis A. [facility] Dietetic Intern Pager: [phone] 09/30/2014 12:24 PM

## 2014-09-30 NOTE — Consult Note (Signed)
HPI: I was asked by Dr. Sharyn Lull to see [name] who is a 64 y.o. male with past medical history significant for hypertension, non-insulin-dependent diabetes mellitus controlled by diet, hyperlipidemia, end-stage renal disease on hemodialysis.  He was started on PD in September failing and switching to HD.  He receives HD TTS at [facility].  His other problems include anemia of chronic disease, gouty arthritis.  He was admitted at [facility] on 09/27/2014 because of chest pain radiating to his back,awakening him from sleep assoc SOB, minimally elevated troponin and cardiac catheterization at [facility] showing 60-70% proximal LAD stenosis. Patient transferred for PCI to LAD PTA which was done today by Dr. Sharyn Lull. She had PRBCs yesterday.  Past Medical History  Diagnosis Date  . Chronic disease anemia   . Hypertension   . High cholesterol   . Type II diabetes mellitus   . History of blood transfusion     "related to anemia"  . GERD (gastroesophageal reflux disease)   . History of stomach ulcers   . Gout   . ESRD (end stage renal disease) on dialysis     "[place]; [place]; [name]" (09/29/2014)   Past Surgical History  Procedure Laterality Date  . Kidney transplant Right 2004  . Peritoneal catheter insertion  02/2014  . Peritoneal catheter removal  08/2014  . Arteriovenous graft placement Left ~ 1996  . Thrombectomy / arteriovenous graft revision  2015  . Nephrectomy transplanted organ  2015  . Cardiac catheterization  09/29/2014    "[place]"   Social History:  reports that he has quit smoking. His smoking use included Cigarettes. He has never used smokeless tobacco. He reports that he does not drink alcohol or use illicit drugs. Allergies:  Allergies  Allergen Reactions  . Ivp Dye [Iodinated Diagnostic Agents]    History reviewed. No pertinent family history.  Medications:  Scheduled: . aspirin  324 mg Oral NOW   Or  . aspirin  300 mg Rectal NOW  . [START ON  10/01/2014] aspirin  81 mg Oral Daily  . atorvastatin  40 mg Oral q1800  . calcium acetate  667 mg Oral TID WC  . [START ON 10/01/2014] clopidogrel  75 mg Oral Q breakfast  . insulin aspart  0-9 Units Subcutaneous TID WC  . metoprolol tartrate  25 mg Oral BID  . multivitamin  1 tablet Oral QHS  . pantoprazole  40 mg Oral Q0600  . [START ON 10/01/2014] vancomycin  500 mg Intravenous Q T,Th,Sa-HD   ROS: Not obtainable due to language barrier Blood pressure 169/76, pulse 71, temperature 98.5 F (36.9 C), temperature source Axillary, resp. rate 13, height 5' (1.524 m), weight 59.194 kg (130 lb 8 oz), SpO2 100 %.  General appearance: alert and cooperative Head: Normocephalic, without obvious abnormality, atraumatic Nose: Nares normal. Septum midline. Mucosa normal. No drainage or sinus tenderness. Resp: clear to auscultation bilaterally Chest wall: no tenderness Cardio: regular rate and rhythm, S1, S2 normal, no murmur, click, rub or gallop GI: soft, non-tender; bowel sounds normal; no masses,  no organomegaly Extremities: extremities normal, atraumatic, no cyanosis or edema, AV access LUE Skin: Skin color, texture, turgor normal. No rashes or lesions Neurologic: Grossly normal Results for orders placed or performed during the hospital encounter of 09/29/14 (from the past 48 hour(s))  Prepare RBC     Status: None   Collection Time: 09/29/14  8:41 PM  Result Value Ref Range   Order Confirmation ORDER PROCESSED BY BLOOD BANK  Comprehensive metabolic panel     Status: Abnormal   Collection Time: 09/29/14 11:10 PM  Result Value Ref Range   Sodium 134 (L) 135 - 145 mmol/L   Potassium 4.6 3.5 - 5.1 mmol/L   Chloride 93 (L) 96 - 112 mmol/L   CO2 21 19 - 32 mmol/L   Glucose, Bld 194 (H) 70 - 99 mg/dL   BUN 11 6 - 23 mg/dL   Creatinine, Ser 4.09 (H) 0.50 - 1.35 mg/dL   Calcium 8.4 8.4 - 10.5 mg/dL   Total Protein 7.7 6.0 - 8.3 g/dL   Albumin 2.8 (L) 3.5 - 5.2 g/dL   AST 15 0 - 37 U/L   ALT  8 0 - 53 U/L   Alkaline Phosphatase 58 39 - 117 U/L   Total Bilirubin 1.5 (H) 0.3 - 1.2 mg/dL   GFR calc non Af Amer 14 (L) >90 mL/min   GFR calc Af Amer 16 (L) >90 mL/min    Comment: (NOTE) The eGFR has been calculated using the CKD EPI equation. This calculation has not been validated in all clinical situations. eGFR's persistently <90 mL/min signify possible Chronic Kidney Disease.    Anion gap 20 (H) 5 - 15  Troponin I-(serum)     Status: Abnormal   Collection Time: 09/29/14 11:10 PM  Result Value Ref Range   Troponin I 0.05 (H) <0.031 ng/mL    Comment:        PERSISTENTLY INCREASED TROPONIN VALUES IN THE RANGE OF 0.04-0.49 ng/mL CAN BE SEEN IN:       -UNSTABLE ANGINA       -CONGESTIVE HEART FAILURE       -MYOCARDITIS       -CHEST TRAUMA       -ARRYHTHMIAS       -LATE PRESENTING MYOCARDIAL INFARCTION       -COPD   CLINICAL FOLLOW-UP RECOMMENDED.   CBC WITH DIFFERENTIAL     Status: Abnormal   Collection Time: 09/29/14 11:10 PM  Result Value Ref Range   WBC 4.4 4.0 - 10.5 K/uL   RBC 3.77 (L) 4.22 - 5.81 MIL/uL   Hemoglobin 8.0 (L) 13.0 - 17.0 g/dL   HCT 26.4 (L) 39.0 - 52.0 %   MCV 70.0 (L) 78.0 - 100.0 fL   MCH 21.2 (L) 26.0 - 34.0 pg   MCHC 30.3 30.0 - 36.0 g/dL   RDW 20.3 (H) 11.5 - 15.5 %   Platelets 238 150 - 400 K/uL    Comment: PLATELET COUNT CONFIRMED BY SMEAR   Neutrophils Relative % 66 43 - 77 %   Lymphocytes Relative 32 12 - 46 %   Monocytes Relative 2 (L) 3 - 12 %   Eosinophils Relative 0 0 - 5 %   Basophils Relative 0 0 - 1 %   Neutro Abs 2.9 1.7 - 7.7 K/uL   Lymphs Abs 1.4 0.7 - 4.0 K/uL   Monocytes Absolute 0.1 0.1 - 1.0 K/uL   Eosinophils Absolute 0.0 0.0 - 0.7 K/uL   Basophils Absolute 0.0 0.0 - 0.1 K/uL   RBC Morphology POLYCHROMASIA PRESENT     Comment: TARGET CELLS TEARDROP CELLS ELLIPTOCYTES   Type and screen     Status: None (Preliminary result)   Collection Time: 09/29/14 11:10 PM  Result Value Ref Range   ABO/RH(D) O POS     Antibody Screen NEG    Sample Expiration 10/02/2014    Unit Number [id number]    Blood Component Type RED CELLS,LR  Unit division 00    Status of Unit ISSUED    Transfusion Status OK TO TRANSFUSE    Crossmatch Result Compatible   ABO/Rh     Status: None   Collection Time: 09/29/14 11:10 PM  Result Value Ref Range   ABO/RH(D) O POS   Glucose, capillary     Status: Abnormal   Collection Time: 09/29/14 11:16 PM  Result Value Ref Range   Glucose-Capillary 193 (H) 70 - 99 mg/dL  Troponin I-(serum)     Status: Abnormal   Collection Time: 09/30/14  6:40 AM  Result Value Ref Range   Troponin I 0.06 (H) <0.031 ng/mL    Comment:        PERSISTENTLY INCREASED TROPONIN VALUES IN THE RANGE OF 0.04-0.49 ng/mL CAN BE SEEN IN:       -UNSTABLE ANGINA       -CONGESTIVE HEART FAILURE       -MYOCARDITIS       -CHEST TRAUMA       -ARRYHTHMIAS       -LATE PRESENTING MYOCARDIAL INFARCTION       -COPD   CLINICAL FOLLOW-UP RECOMMENDED.   Lipid panel     Status: Abnormal   Collection Time: 09/30/14  6:40 AM  Result Value Ref Range   Cholesterol 115 0 - 200 mg/dL   Triglycerides 79 <150 mg/dL   HDL 30 (L) >39 mg/dL   Total CHOL/HDL Ratio 3.8 RATIO   VLDL 16 0 - 40 mg/dL   LDL Cholesterol 69 0 - 99 mg/dL    Comment:        Total Cholesterol/HDL:CHD Risk Coronary Heart Disease Risk Table                     Men   Women  1/2 Average Risk   3.4   3.3  Average Risk       5.0   4.4  2 X Average Risk   9.6   7.1  3 X Average Risk  23.4   11.0        Use the calculated Patient Ratio above and the CHD Risk Table to determine the patient's CHD Risk.        ATP III CLASSIFICATION (LDL):  <100     mg/dL   Optimal  100-129  mg/dL   Near or Above                    Optimal  130-159  mg/dL   Borderline  160-189  mg/dL   High  >190     mg/dL   Very High   CBC     Status: Abnormal   Collection Time: 09/30/14  6:40 AM  Result Value Ref Range   WBC 5.1 4.0 - 10.5 K/uL   RBC 4.07 (L) 4.22 -  5.81 MIL/uL   Hemoglobin 9.1 (L) 13.0 - 17.0 g/dL   HCT 28.9 (L) 39.0 - 52.0 %   MCV 71.0 (L) 78.0 - 100.0 fL   MCH 22.4 (L) 26.0 - 34.0 pg   MCHC 31.5 30.0 - 36.0 g/dL   RDW 19.7 (H) 11.5 - 15.5 %   Platelets 237 150 - 400 K/uL    Comment: PLATELET COUNT CONFIRMED BY SMEAR  Protime-INR     Status: Abnormal   Collection Time: 09/30/14  6:40 AM  Result Value Ref Range   Prothrombin Time 15.4 (H) 11.6 - 15.2 seconds   INR 1.21 0.00 - 1.49  MRSA PCR Screening     Status: None   Collection Time: 09/30/14  6:48 AM  Result Value Ref Range   MRSA by PCR NEGATIVE NEGATIVE    Comment:        The GeneXpert MRSA Assay (FDA approved for NASAL specimens only), is one component of a comprehensive MRSA colonization surveillance program. It is not intended to diagnose MRSA infection nor to guide or monitor treatment for MRSA infections.   Glucose, capillary     Status: Abnormal   Collection Time: 09/30/14  7:57 AM  Result Value Ref Range   Glucose-Capillary 139 (H) 70 - 99 mg/dL  Troponin I-(serum)     Status: Abnormal   Collection Time: 09/30/14  8:23 AM  Result Value Ref Range   Troponin I 0.06 (H) <0.031 ng/mL    Comment:        PERSISTENTLY INCREASED TROPONIN VALUES IN THE RANGE OF 0.04-0.49 ng/mL CAN BE SEEN IN:       -UNSTABLE ANGINA       -CONGESTIVE HEART FAILURE       -MYOCARDITIS       -CHEST TRAUMA       -ARRYHTHMIAS       -LATE PRESENTING MYOCARDIAL INFARCTION       -COPD   CLINICAL FOLLOW-UP RECOMMENDED.   Basic metabolic panel     Status: Abnormal   Collection Time: 09/30/14  8:23 AM  Result Value Ref Range   Sodium 134 (L) 135 - 145 mmol/L   Potassium 4.6 3.5 - 5.1 mmol/L   Chloride 92 (L) 96 - 112 mmol/L   CO2 25 19 - 32 mmol/L   Glucose, Bld 152 (H) 70 - 99 mg/dL   BUN 20 6 - 23 mg/dL   Creatinine, Ser 4.78 (H) 0.50 - 1.35 mg/dL   Calcium 8.6 8.4 - 10.5 mg/dL   GFR calc non Af Amer 12 (L) >90 mL/min   GFR calc Af Amer 14 (L) >90 mL/min    Comment:  (NOTE) The eGFR has been calculated using the CKD EPI equation. This calculation has not been validated in all clinical situations. eGFR's persistently <90 mL/min signify possible Chronic Kidney Disease.    Anion gap 17 (H) 5 - 15  CBC     Status: Abnormal   Collection Time: 09/30/14  9:30 AM  Result Value Ref Range   WBC 5.3 4.0 - 10.5 K/uL   RBC 4.28 4.22 - 5.81 MIL/uL   Hemoglobin 9.6 (L) 13.0 - 17.0 g/dL   HCT 30.2 (L) 39.0 - 52.0 %   MCV 70.6 (L) 78.0 - 100.0 fL   MCH 22.4 (L) 26.0 - 34.0 pg   MCHC 31.8 30.0 - 36.0 g/dL   RDW 19.6 (H) 11.5 - 15.5 %   Platelets 233 150 - 400 K/uL  POCT Activated clotting time     Status: None   Collection Time: 09/30/14 12:16 PM  Result Value Ref Range   Activated Clotting Time 356 seconds   Dg Chest Port 1 View  09/29/2014   CLINICAL DATA:  Shortness of breath.  Initial encounter.  EXAM: PORTABLE CHEST - 1 VIEW  COMPARISON:  None.  FINDINGS: 2207 hour. The heart size and mediastinal contours are normal. There is patchy retrocardiac opacity which may reflect atelectasis. The right lung is clear. There is no edema, significant pleural effusion or pneumothorax. Metallic vascular stent is noted in the left upper arm. No acute osseous findings seen.  IMPRESSION: Patchy left lower lobe  atelectasis or early infiltrate.  No edema.   Electronically Signed   By: Richardean Sale M.D.   On: 09/29/2014 21:23    Assessment:  1 ESRD 2 Anemia s/p PRBC 3 CAD s/p PTCA Plan: 1 Schedule routine HD for [place]  Lennox Dolberry C 09/30/2014, 2:03 PM

## 2014-09-30 NOTE — Procedures (Signed)
Central Venous Catheter Insertion Procedure Note [name] [id number] 1950-09-03  Procedure: Insertion of Central Venous Catheter Indications: Assessment of intravascular volume, Drug and/or fluid administration and Frequent blood sampling  Procedure Details Consent: Risks of procedure as well as the alternatives and risks of each were explained to the (patient/caregiver).  Consent for procedure obtained.   Time Out: Verified patient identification, verified procedure, site/side was marked, verified correct patient position, special equipment/implants available, medications/allergies/relevent history reviewed, required imaging and test results available.  Performed  Maximum sterile technique was used including antiseptics, cap, gloves, gown, hand hygiene, mask and sheet. Skin prep: Chlorhexidine; local anesthetic administered  A antimicrobial bonded/coated triple lumen catheter was placed in the left internal jugular vein to 17 cm (pt is 5' ) using the Seldinger technique.  Evaluation Blood flow good Complications: No apparent complications Patient did tolerate procedure well. Chest X-ray ordered to verify placement.  CXR: pending.    Procedure performed under direct supervision of Dr. Nelda Marseille and with ultrasound guidance for real time vessel cannulation.      David Gens, NP-C [facility] Pgr: [phone] or [phone]  U/S used in placement.  I was present and supervised the entire procedure.  Rush Farmer, M.D. [facility] Pulmonary/Critical Care Medicine. Pager: [phone]. After hours pager: [phone].  09/30/2014, 4:04 PM

## 2014-09-30 NOTE — Progress Notes (Signed)
Found pt in room with central line dressing pulled off and hand rapped around central line. Was able to stabilize line and redress. Able to get blood return and flush all lumens. In addition, pt removed dressing on his left femoral access site. Site level zero. Oozing some blood. Pressure applied and redressed. Pt increasingly agitated, trying to pull at everything. Mittens were applied earlier but pt bit them. Mittens reapplied. Notified Dr. Terrence Dupont of pts actions. Ativan 1mg  q 6hrs prn ordered. Ativan administered, pt now resting. Will continue to monitor.

## 2014-09-30 NOTE — Care Management Note (Addendum)
    Page 1 of 1   09/30/2014     2:52:55 PM CARE MANAGEMENT NOTE 09/30/2014  Patient:  [name], [name]   Account Number:  [id number]  Date Initiated:  09/30/2014  Documentation initiated by:  Elissa Hefty  Subjective/Objective Assessment:   adm w pos card cath     Action/Plan:   lives w wife, pcp dr Lavonia Dana   Anticipated DC Date:     Anticipated DC Plan:  HOME/SELF CARE         Choice offered to / List presented to:             Status of service:  In process, will continue to follow Medicare Important Message given?  YES (If response is "NO", the following Medicare IM given date fields will be blank) Date Medicare IM given:  09/30/2014 Medicare IM given by:  GRAVES-BIGELOW,Yassine Brunsman Date Additional Medicare IM given:   Additional Medicare IM given by:    Discharge Disposition:    Per UR Regulation:  Reviewed for med. necessity/level of care/duration of stay  If discussed at [place] of Stay Meetings, dates discussed:    Comments:

## 2014-09-30 NOTE — Cardiovascular Report (Signed)
NAMEMASSAI, MANDUJANO NO.:  [id number]  MEDICAL RECORD NO.:  [id number]  LOCATION:  2H27C                        FACILITY:  [facility]  PHYSICIAN:  Samary Shatz N. Terrence Dupont, M.D. DATE OF BIRTH:  17-Feb-1951  DATE OF PROCEDURE:  09/30/14 DATE OF DISCHARGE:                           CARDIAC CATHETERIZATION   PROCEDURE: 1. Successful percutaneous transluminal coronary angioplasty to ostial     and proximal left anterior descending coronary artery using 2.5 x     12 mm long Emerge balloon. 2. Successful deployment of 3.0 x 38 mm long Xience drug-eluting stent     in ostial and proximal LAD. 3. Successful postdilatation of the stent using 3.5 x 20 mm long [place]     Emerge balloon.  INDICATION FOR THE PROCEDURE:  Mr. Umair Graddick is a 64 year old [place] male, with past medical history significant for hypertension, non-insulin-dependent diabetes mellitus, controlled by diet, hypercholesteremia, end-stage renal disease, on hemodialysis; anemia of chronic disease, gouty arthritis, was admitted at [facility] on September 27, 2014, because of chest pain which woke him up, grade 5/10 radiating to the back associated with shortness of breath, diaphoresis and dizziness.  The patient was noted to have minimally elevated troponin I.  Subsequently, underwent Lexiscan Myoview which showed lateral and inferior wall ischemia with EF of 55%.  The patient subsequently underwent cardiac catheterization yesterday at [facility] which showed approximately 40% distal left main stenosis, critical ostial LAD stenosis with ruptured plaque and 60-70% proximal LAD stenosis and mild RCA and left circumflex disease.  The patient was transferred here for PCI to LAD.  The patient presently denies any chest pain, but had jaw pain while being transferred from the [facility].  Due to typical anginal chest pain, positive nuclear stress test discussed with patient and  family at length regarding PTCA stenting to ostial and proximal LAD, its risks and benefits, i.e., death, MI, stroke, need for emergency CABG, local vascular complications, etc. and consented for PCI.  DESCRIPTION OF PROCEDURE:  After obtaining the informed consent, patient was brought to the cath lab and was placed on fluoroscopy table.  Both the groins were prepped and draped in usual fashion.  A 6-French arterial sheath was placed in left femoral artery without difficulty and 6-French venous sheath was placed in the right femoral vein, which was switched to 8-French sheath because of persistent oozing at the end of the procedure.  Next, 3.5 left [place] guiding catheter was advanced via the left femoral artery, up to the ascending aorta.  Wire was pulled out.  The catheter was aspirated and connected to the Manifold. Catheter was further advanced and engaged into left coronary ostium. Multiple views of the left system were taken.  FINDINGS:  Left main has distal 40% stenosis.  LAD has 85-90% ostial stenosis with ruptured plaque and 65-70% proximal stenosis at the bifurcation with diagonal 1.  Diagonal 1 has 20-30% ostial stenosis as before.  INTERVENTIONAL PROCEDURE:  Successful PTCA to proximal and ostial LAD was done using 2.5 x 12 mm long Emerge balloon for predilatation and then 3.0 x 38 mm long Xience [place] drug-eluting stent was deployed at 11 atmospheric pressure.  This stent was postdilated using 3.5 x 20 mm long [place] Emerge balloon going up to 18 atmospheric pressure.  Lesions dilated from 60-70% and 85-90% to 0% residual with excellent TIMI grade 3 distal flow without evidence of dissection or distal embolization. The patient received weight-based Angiomax and 150 mg of Plavix today prior to the procedure.  The patient already received total 600 mg of Plavix yesterday, 300 mg at [facility] and 300 mg yesterday upon transfer to [facility].  The patient  tolerated the procedure well.  There were no complications.  Postprocedure, left femoral arterial sheath was pulled out and arteriotomy was closed with Angio-Seal by Dr. [place] with good hemostasis with no evidence of hematoma.  The patient tolerated the procedure well.  There were no complications.  The patient was transferred to recovery room in stable condition.     Allegra Lai. Terrence Dupont, M.D.     MNH/MEDQ  D:  09/30/2014  T:  09/30/2014  Job:  [id number]

## 2014-09-30 NOTE — Progress Notes (Signed)
Subjective:  Patient denies any chest pain or shortness of breath. Patient had 2 blood cultures drawn at [facility] yesterday which are positive for strep. Final results pending  Objective:  Vital Signs in the last 24 hours: Temp:  [97.5 F (36.4 C)-98.6 F (37 C)] 97.8 F (36.6 C) (04/15 0800) Pulse Rate:  [67-80] 67 (04/15 0800) Resp:  [16-22] 18 (04/15 0800) BP: (144-163)/(64-89) 144/89 mmHg (04/15 0800) SpO2:  [0 %-100 %] 100 % (04/15 0800) FiO2 (%):  [0 %] 0 % (04/14 2037) Weight:  [59.194 kg (130 lb 8 oz)] 59.194 kg (130 lb 8 oz) (04/14 2037)  Intake/Output from previous day: 04/14 0701 - 04/15 0700 In: 104 [P.O.:480; Blood:332] Out: -  Intake/Output from this shift:    Physical Exam: Neck: no adenopathy, no carotid bruit, no JVD and supple, symmetrical, trachea midline Lungs: Decreased breath sound at bases Heart: regular rate and rhythm, S1, S2 normal and 2/6 systolic murmur noted Abdomen: soft, non-tender; bowel sounds normal; no masses,  no organomegaly Extremities: extremities normal, atraumatic, no cyanosis or edema  Lab Results:  Recent Labs  09/29/14 2310 09/30/14 0640  WBC 4.4 5.1  HGB 8.0* 9.1*  PLT 238 PENDING    Recent Labs  09/29/14 2310  NA 134*  K 4.6  CL 93*  CO2 21  GLUCOSE 194*  BUN 11  CREATININE 4.09*    Recent Labs  09/29/14 2310  TROPONINI 0.05*   Hepatic Function Panel  Recent Labs  09/29/14 2310  PROT 7.7  ALBUMIN 2.8*  AST 15  ALT 8  ALKPHOS 58  BILITOT 1.5*   No results for input(s): CHOL in the last 72 hours. No results for input(s): PROTIME in the last 72 hours.  Imaging: Imaging results have been reviewed and Dg Chest Port 1 View  09/29/2014   CLINICAL DATA:  Shortness of breath.  Initial encounter.  EXAM: PORTABLE CHEST - 1 VIEW  COMPARISON:  None.  FINDINGS: 2207 hour. The heart size and mediastinal contours are normal. There is patchy retrocardiac opacity which may reflect atelectasis. The  right lung is clear. There is no edema, significant pleural effusion or pneumothorax. Metallic vascular stent is noted in the left upper arm. No acute osseous findings seen.  IMPRESSION: Patchy left lower lobe atelectasis or early infiltrate.  No edema.   Electronically Signed   By: Richardean Sale M.D.   On: 09/29/2014 21:23    Cardiac Studies:  Assessment/Plan:  Acute coronary syndrome Status post left cardiac cath at [facility] renal [facility] with critical ostial LAD stenosis with ruptured plaque with TIMI 3 distal flow. Strep bacteremia ? source Hypertension Diabetes mellitus End-stage renal disease on hemodialysis Hypercholesteremia History of gouty arthritis Anemia of chronic disease Plan Continue vancomycin for now Check 2-D echo May need transesophageal echo to rule out endocarditis  ID consult Renal service consult Discussed with patient's family regarding PT CA stenting to LAD is risk and benefits and consents for PCI  LOS: 1 day    [name] N 09/30/2014, 8:05 AM

## 2014-09-30 NOTE — Progress Notes (Signed)
Upon patient urination, copious amounts of bright red blood noted. Patient states he has had difficult/painful urination prior to this admission per hospital interpreter. MD notified. STAT CBC ordered. Will continue to monitor.

## 2014-09-30 NOTE — Progress Notes (Signed)
ANTIBIOTIC CONSULT NOTE - FOLLOW UP  Pharmacy Consult for Vancomycin Indication: Bacteremia  Allergies  Allergen Reactions  . Ivp Dye [Iodinated Diagnostic Agents]    Patient Measurements: Height: 5' (152.4 cm) Weight: 130 lb 8 oz (59.194 kg) IBW/kg (Calculated) : 50  Vital Signs: Temp: 98.6 F (37 C) (04/15 0049) Temp Source: Oral (04/15 0049) BP: 144/64 mmHg (04/15 0049) Pulse Rate: 80 (04/15 0049)  Labs:  Recent Labs  09/29/14 2310  WBC 4.4  HGB 8.0*  PLT PENDING  CREATININE 4.09*   Estimated Creatinine Clearance: 13.1 mL/min (by C-G formula based on Cr of 4.09).   Anti-infectives    Start     Dose/Rate Route Frequency Ordered Stop   09/29/14 2100  vancomycin (VANCOCIN) IVPB 1000 mg/200 mL premix     1,000 mg 200 mL/hr over 60 Minutes Intravenous  Once 09/29/14 2056 09/29/14 2329      Assessment: ESRD on HD TTS, 2/2 blood cultures for GPC from [place]  Goal of Therapy:  Pre-HD vancomycin level 15-25 mg/L  Plan:  -Vancomycin 1000 mg already given, then 500 mg IV qHD TTS -F/U HD schedule  -Drug levels as indicated   [name] 09/30/2014,12:54 AM

## 2014-09-30 NOTE — Progress Notes (Signed)
During AM shift change report, severe IV infiltration was noted on patient's RT FA. Fluids immediately stopped and IV removed by night shift RN. Pressure dressing and warm compress applied. MD notified at bedside of infiltration and patient's need for PICC line, due to multiple ultrasound guided difficult/unsuccessful attempts and absence of current IV access. Infectious disease notified for PICC placement order per protocol, by MD. Will continue to monitor.

## 2014-10-01 DIAGNOSIS — E1129 Type 2 diabetes mellitus with other diabetic kidney complication: Secondary | ICD-10-CM

## 2014-10-01 DIAGNOSIS — R0602 Shortness of breath: Secondary | ICD-10-CM

## 2014-10-01 DIAGNOSIS — Y649 Contaminated medical or biological substance administered by unspecified means: Secondary | ICD-10-CM | POA: Insufficient documentation

## 2014-10-01 DIAGNOSIS — A419 Sepsis, unspecified organism: Secondary | ICD-10-CM

## 2014-10-01 LAB — BASIC METABOLIC PANEL
ANION GAP: 15 (ref 5–15)
BUN: 33 mg/dL — AB (ref 6–23)
CHLORIDE: 93 mmol/L — AB (ref 96–112)
CO2: 26 mmol/L (ref 19–32)
CREATININE: 6.03 mg/dL — AB (ref 0.50–1.35)
Calcium: 7.7 mg/dL — ABNORMAL LOW (ref 8.4–10.5)
GFR calc Af Amer: 10 mL/min — ABNORMAL LOW (ref >90)
GFR calc non Af Amer: 9 mL/min — ABNORMAL LOW (ref >90)
GLUCOSE: 167 mg/dL — AB (ref 70–99)
POTASSIUM: 4.2 mmol/L (ref 3.5–5.1)
SODIUM: 134 mmol/L — AB (ref 135–145)

## 2014-10-01 LAB — CBC
HEMATOCRIT: 22.7 % — AB (ref 39.0–52.0)
HEMOGLOBIN: 7.3 g/dL — AB (ref 13.0–17.0)
MCH: 22.5 pg — AB (ref 26.0–34.0)
MCHC: 32.2 g/dL (ref 30.0–36.0)
MCV: 69.8 fL — ABNORMAL LOW (ref 78.0–100.0)
PLATELETS: 234 10*3/uL (ref 150–400)
RBC: 3.25 MIL/uL — ABNORMAL LOW (ref 4.22–5.81)
RDW: 19.8 % — ABNORMAL HIGH (ref 11.5–15.5)
WBC: 6.4 10*3/uL (ref 4.0–10.5)

## 2014-10-01 LAB — HIV ANTIBODY (ROUTINE TESTING W REFLEX): HIV SCREEN 4TH GENERATION: NONREACTIVE

## 2014-10-01 LAB — HEMOGLOBIN A1C
Hgb A1c MFr Bld: 5.5 % (ref 4.8–5.6)
MEAN PLASMA GLUCOSE: 111 mg/dL

## 2014-10-01 LAB — C-REACTIVE PROTEIN: CRP: 2.2 mg/dL — ABNORMAL HIGH (ref <0.60)

## 2014-10-01 LAB — CULTURE, BLOOD (SINGLE)

## 2014-10-01 LAB — GLUCOSE, CAPILLARY
GLUCOSE-CAPILLARY: 126 mg/dL — AB (ref 70–99)
GLUCOSE-CAPILLARY: 138 mg/dL — AB (ref 70–99)
GLUCOSE-CAPILLARY: 100 mg/dL — AB (ref 70–99)
GLUCOSE-CAPILLARY: 176 mg/dL — AB (ref 70–99)
Glucose-Capillary: 223 mg/dL — ABNORMAL HIGH (ref 70–99)
Glucose-Capillary: 172 mg/dL — ABNORMAL HIGH (ref 70–99)

## 2014-10-01 LAB — HEPATITIS B SURFACE ANTIGEN: Hepatitis B Surface Ag: NEGATIVE

## 2014-10-01 LAB — SEDIMENTATION RATE: SED RATE: 38 mm/h — AB (ref 0–16)

## 2014-10-01 LAB — PREPARE RBC (CROSSMATCH)

## 2014-10-01 MED ORDER — HEPARIN SODIUM (PORCINE) 1000 UNIT/ML DIALYSIS
20.0000 [IU]/kg | INTRAMUSCULAR | Status: DC | PRN
  Filled 2014-10-01: qty 2

## 2014-10-01 MED ORDER — LIDOCAINE-PRILOCAINE 2.5-2.5 % EX CREA
1.0000 "application " | TOPICAL_CREAM | CUTANEOUS | Status: DC | PRN
  Filled 2014-10-01: qty 5

## 2014-10-01 MED ORDER — SODIUM CHLORIDE 0.9 % IV SOLN: 100.0000 mL | INTRAVENOUS | Status: DC | PRN

## 2014-10-01 MED ORDER — METOPROLOL TARTRATE 50 MG PO TABS
50.0000 mg | ORAL_TABLET | Freq: Two times a day (BID) | ORAL | Status: DC
  Administered 2014-10-01 – 2014-10-02 (×3): 50 mg via ORAL
  Filled 2014-10-01 (×4): qty 1

## 2014-10-01 MED ORDER — SODIUM CHLORIDE 0.9 % IV SOLN: Freq: Once | INTRAVENOUS | Status: DC

## 2014-10-01 MED ORDER — HEPARIN SODIUM (PORCINE) 1000 UNIT/ML DIALYSIS
1000.0000 [IU] | INTRAMUSCULAR | Status: DC | PRN
  Filled 2014-10-01: qty 1

## 2014-10-01 MED ORDER — PRO-STAT SUGAR FREE PO LIQD
45.0000 mL | Freq: Three times a day (TID) | ORAL | Status: DC
Start: 2014-10-01 — End: 2014-10-02
  Administered 2014-10-01 – 2014-10-02 (×2): 45 mL via ORAL
  Filled 2014-10-01 (×5): qty 60

## 2014-10-01 MED ORDER — BOOST / RESOURCE BREEZE PO LIQD
1.0000 | Freq: Three times a day (TID) | ORAL | Status: DC
  Administered 2014-10-01 – 2014-10-02 (×4): 1 via ORAL
  Filled 2014-10-01 (×3): qty 1

## 2014-10-01 MED ORDER — PENTAFLUOROPROP-TETRAFLUOROETH EX AERO: 1.0000 "application " | INHALATION_SPRAY | CUTANEOUS | Status: DC | PRN

## 2014-10-01 MED ORDER — SODIUM CHLORIDE 0.9 % IJ SOLN: 10.0000 mL | Freq: Two times a day (BID) | INTRAMUSCULAR | Status: DC

## 2014-10-01 MED ORDER — AMLODIPINE BESYLATE 5 MG PO TABS: 5.0000 mg | ORAL_TABLET | Freq: Every day | ORAL | Status: DC

## 2014-10-01 MED ORDER — SODIUM CHLORIDE 0.9 % IJ SOLN
10.0000 mL | INTRAMUSCULAR | Status: DC | PRN
  Administered 2014-10-02: 30 mL
  Filled 2014-10-01: qty 40

## 2014-10-01 MED ORDER — ALTEPLASE 2 MG IJ SOLR
2.0000 mg | Freq: Once | INTRAMUSCULAR | Status: AC | PRN
  Filled 2014-10-01: qty 2

## 2014-10-01 MED ORDER — AMLODIPINE BESYLATE 5 MG PO TABS
5.0000 mg | ORAL_TABLET | Freq: Every day | ORAL | Status: DC
  Administered 2014-10-01 – 2014-10-02 (×2): 5 mg via ORAL
  Filled 2014-10-01 (×2): qty 1

## 2014-10-01 MED ORDER — NEPRO/CARBSTEADY PO LIQD
237.0000 mL | ORAL | Status: DC | PRN
  Filled 2014-10-01: qty 237

## 2014-10-01 MED ORDER — LIDOCAINE HCL (PF) 1 % IJ SOLN: 5.0000 mL | INTRAMUSCULAR | Status: DC | PRN

## 2014-10-01 NOTE — Progress Notes (Signed)
[facility] for Infectious Disease        Subjective: Pt is delirious and in restraints on HD   Antibiotics:  Anti-infectives    Start     Dose/Rate Route Frequency Ordered Stop   10/01/14 1200  vancomycin (VANCOCIN) 500 mg in sodium chloride 0.9 % 100 mL IVPB     500 mg 100 mL/hr over 60 Minutes Intravenous Every T-Th-Sa (Hemodialysis) 09/30/14 0058     09/30/14 1900  cefTRIAXone (ROCEPHIN) 2 g in dextrose 5 % 50 mL IVPB - Premix     2 g 100 mL/hr over 30 Minutes Intravenous Every 24 hours 09/30/14 1800     09/30/14 1000  linezolid (ZYVOX) tablet 600 mg  Status:  Discontinued     600 mg Oral Every 12 hours 09/30/14 0947 09/30/14 1038   09/30/14 0900  cefTRIAXone (ROCEPHIN) 2 g in dextrose 5 % 50 mL IVPB - Premix  Status:  Discontinued     2 g 100 mL/hr over 30 Minutes Intravenous Every 24 hours 09/30/14 0808 09/30/14 1105   09/29/14 2100  vancomycin (VANCOCIN) IVPB 1000 mg/200 mL premix     1,000 mg 200 mL/hr over 60 Minutes Intravenous  Once 09/29/14 2056 09/29/14 2329      Medications: Scheduled Meds: . sodium chloride   Intravenous Once  . amLODipine  5 mg Oral Daily  . aspirin  81 mg Oral Daily  . atorvastatin  40 mg Oral q1800  . calcium acetate  667 mg Oral TID WC  . cefTRIAXone (ROCEPHIN)  IV  2 g Intravenous Q24H  . clopidogrel  75 mg Oral Q breakfast  . feeding supplement (PRO-STAT SUGAR FREE 64)  45 mL Oral TID  . feeding supplement (RESOURCE BREEZE)  1 Container Oral TID BM  . insulin aspart  0-9 Units Subcutaneous TID WC  . metoprolol tartrate  50 mg Oral BID  . multivitamin  1 tablet Oral QHS  . pantoprazole  40 mg Oral Q0600  . vancomycin  500 mg Intravenous Q T,Th,Sa-HD   Continuous Infusions: . sodium chloride Stopped (09/30/14 0746)  . sodium chloride Stopped (09/30/14 2300)  . nitroGLYCERIN Stopped (09/30/14 0746)   PRN Meds:.sodium chloride, sodium chloride, acetaminophen, alteplase, feeding supplement (NEPRO CARB STEADY), heparin,  heparin, lidocaine (PF), lidocaine-prilocaine, nitroGLYCERIN, ondansetron (ZOFRAN) IV, pentafluoroprop-tetrafluoroeth    Objective: Weight change: 4 lb 9.6 oz (2.087 kg)  Intake/Output Summary (Last 24 hours) at 10/01/14 1421 Last data filed at 10/01/14 1230  Gross per 24 hour  Intake    475 ml  Output   3000 ml  Net  -2525 ml   Blood pressure 107/80, pulse 101, temperature 97.7 F (36.5 C), temperature source Axillary, resp. rate 14, height 5' (1.524 m), weight 128 lb 12 oz (58.4 kg), SpO2 100 %. Temp:  [97.7 F (36.5 C)-98.1 F (36.7 C)] 97.7 F (36.5 C) (04/16 1230) Pulse Rate:  [42-101] 101 (04/16 1230) Resp:  [12-29] 14 (04/16 1230) BP: (99-208)/(46-140) 107/80 mmHg (04/16 1230) SpO2:  [89 %-100 %] 100 % (04/16 1230) Weight:  [128 lb 12 oz (58.4 kg)-137 lb 2 oz (62.2 kg)] 128 lb 12 oz (58.4 kg) (04/16 1230)  Physical Exam: General: Delirious and in restraints  HEENT: anicteric sclera, pupils reactive to light and accommodation, EOMI CVS regular rate, normal r Chest: clear to auscultation bilaterally, no wheezing, rales or rhonchi Abdomen: soft nontender, nondistended, normal bowel sounds, Extremities: no  clubbing or edema noted bilaterally  Neuro: nonfocal  CBC: CBC  Latest Ref Rng 10/01/2014 09/30/2014 09/30/2014  WBC 4.0 - 10.5 K/uL 6.4 5.3 5.1  Hemoglobin 13.0 - 17.0 g/dL 7.3(L) 9.6(L) 9.1(L)  Hematocrit 39.0 - 52.0 % 22.7(L) 30.2(L) 28.9(L)  Platelets 150 - 400 K/uL 234 233 237       BMET  Recent Labs  09/30/14 0823 10/01/14 0415  NA 134* 134*  K 4.6 4.2  CL 92* 93*  CO2 25 26  GLUCOSE 152* 167*  BUN 20 33*  CREATININE 4.78* 6.03*  CALCIUM 8.6 7.7*     Liver Panel   Recent Labs  09/29/14 2310  PROT 7.7  ALBUMIN 2.8*  AST 15  ALT 8  ALKPHOS 58  BILITOT 1.5*       Sedimentation Rate  Recent Labs  10/01/14 0415  ESRSEDRATE 38*   C-Reactive Protein No results for input(s): CRP in the last 72 hours.  Micro Results: Recent  Results (from the past 720 hour(s))  MRSA PCR Screening     Status: None   Collection Time: 09/30/14  6:48 AM  Result Value Ref Range Status   MRSA by PCR NEGATIVE NEGATIVE Final    Comment:        The GeneXpert MRSA Assay (FDA approved for NASAL specimens only), is one component of a comprehensive MRSA colonization surveillance program. It is not intended to diagnose MRSA infection nor to guide or monitor treatment for MRSA infections.   Culture, blood (routine x 2)     Status: None (Preliminary result)   Collection Time: 09/30/14 10:15 AM  Result Value Ref Range Status   Specimen Description BLOOD RIGHT HAND  Final   Special Requests   Final    BOTTLES DRAWN AEROBIC AND ANAEROBIC 10CC BLUE, 5CC RED   Culture   Final           BLOOD CULTURE RECEIVED NO GROWTH TO DATE CULTURE WILL BE HELD FOR 5 DAYS BEFORE ISSUING A FINAL NEGATIVE REPORT Performed at [facility]    Report Status PENDING  Incomplete  Culture, blood (routine x 2)     Status: None (Preliminary result)   Collection Time: 09/30/14 10:27 AM  Result Value Ref Range Status   Specimen Description BLOOD RIGHT FINGER  Final   Special Requests   Final    BOTTLES DRAWN AEROBIC AND ANAEROBIC 10CC BLUE, 5CC RED   Culture   Final           BLOOD CULTURE RECEIVED NO GROWTH TO DATE CULTURE WILL BE HELD FOR 5 DAYS BEFORE ISSUING A FINAL NEGATIVE REPORT Performed at [facility]    Report Status PENDING  Incomplete    Studies/Results: Dg Chest Port 1 View  09/30/2014   CLINICAL DATA:  Encounter for central line placement.  EXAM: PORTABLE CHEST - 1 VIEW  COMPARISON:  09/29/2014  FINDINGS: New left internal jugular central venous line has its tip in the mid superior vena cava. The junction of the brachiocephalic vein and superior vena cava.  No pneumothorax.  Left greater than right lung base opacity is again noted, without change, likely atelectasis. No pulmonary edema.  IMPRESSION: 1. Left internal jugular  central venous catheter tip projects in the mid superior vena cava. No pneumothorax. No other change from the prior day's study.   Electronically Signed   By: Lajean Manes M.D.   On: 09/30/2014 16:26   Dg Chest Port 1 View  09/29/2014   CLINICAL DATA:  Shortness of breath.  Initial encounter.  EXAM: PORTABLE CHEST -  1 VIEW  COMPARISON:  None.  FINDINGS: 2207 hour. The heart size and mediastinal contours are normal. There is patchy retrocardiac opacity which may reflect atelectasis. The right lung is clear. There is no edema, significant pleural effusion or pneumothorax. Metallic vascular stent is noted in the left upper arm. No acute osseous findings seen.  IMPRESSION: Patchy left lower lobe atelectasis or early infiltrate.  No edema.   Electronically Signed   By: Richardean Sale M.D.   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 male with MI and sp PTCA but was also though to have streptocooccal vs possibly polymicrobial bacteremia and lendocarditis    #1 ?  Polymicrobial bacteremia:  I RECEIVED FINAL RESULTS ON ONE BLOOD CULTURE  PATIENT HAD TWO BLOOOD CULTURES COLLECTED IN ED  ONE BLOOD CULTURE IS NGTD AT 4.5 DAYS NEARLY FINAL  THE OTHER BLOOD CULTURE GREW VIRIDANS STREP, TWO COAG NEGATIVE STAPH AND A GRAM POSITIVE ROD = CONTAMINANT  THEREFORE THIS PATIENT DOES NOT HAVE BACTEREMIA  I AM STOPPING ALL OF HIS ANTIBIOTICS AND WOULD NOT PURSUE TEE  I will sign off please call with further questions.       LOS: 2 days   Alcide Evener 10/01/2014, 2:21 PM

## 2014-10-01 NOTE — Progress Notes (Addendum)
Pt BP 195/97 notified Dr. Terrence Dupont. Norvasc 5mg  PO was ordered. Will continue to monitor.

## 2014-10-01 NOTE — Progress Notes (Signed)
Pt BP now 205/84 notified Dr. Terrence Dupont and requested something while we wait for the Norvasc to work. No new orders at this time. Will continue to monitor.

## 2014-10-01 NOTE — Progress Notes (Signed)
Nutrition Brief Note  Consult received for wound healing.  RD team following. Please see full nutrition assessment 4/15. Diet has advanced Heart Healthy/CHO Modified 0% of dinner consumed. He has 2 month hx of poor appetite and unplanned wt loss. He is receiving dialysis today and says he does not like Nepro.  Plan:  Add protein modular 45 ml TID (150 kcal, 22 gr protein each 45 ml). Resource Breeze po TID, each supplement provides 250 kcal and 9 grams of protein  Colman Cater MS,RD,CSG,LDN Office: [phone] Pager: [phone]

## 2014-10-01 NOTE — Procedures (Signed)
Agitation on dialysis.  Appears confused.  Getting blood on HD for ABLA associated with the PCTA yesterday. Romaldo Saville C

## 2014-10-01 NOTE — Progress Notes (Signed)
Subjective: Appreciate infectious disease and renal consult . Patient seen in hemodialysis unit tolerating hemodialysis. Had episode of agitation and confusion last night requiring Ativan. Patient tolerated complex PCI to ostial and proximal LAD yesterday with excellent angiographic results. Patient's hemoglobin is down to 7.3 due to significant blood loss during the procedure. Both the groins are stable with no evidence of hematoma  Objective:  Vital Signs in the last 24 hours: Temp:  [97.7 F (36.5 C)-98.5 F (36.9 C)] 98.1 F (36.7 C) (04/16 0950) Pulse Rate:  [42-99] 90 (04/16 1000) Resp:  [12-29] 26 (04/16 0950) BP: (99-208)/(46-140) 157/94 mmHg (04/16 1000) SpO2:  [89 %-100 %] 100 % (04/16 0950) Weight:  [61.281 kg (135 lb 1.6 oz)-62.2 kg (137 lb 2 oz)] 62.2 kg (137 lb 2 oz) (04/16 0818)  Intake/Output from previous day: 04/15 0701 - 04/16 0700 In: 140 [I.V.:90; IV Piggyback:50] Out: 1 [Urine:1] Intake/Output from this shift:    Physical Exam: Neck: no adenopathy, no carotid bruit, no JVD and supple, symmetrical, trachea midline Lungs: clear to auscultation bilaterally Heart: regular rate and rhythm, S1, S2 normal and 2/6 systolic murmur noted Abdomen: soft, non-tender; bowel sounds normal; no masses,  no organomegaly Extremities: extremities normal, atraumatic, no cyanosis or edema and Both groin stable  Lab Results:  Recent Labs  09/30/14 0930 10/01/14 0415  WBC 5.3 6.4  HGB 9.6* 7.3*  PLT 233 234    Recent Labs  09/30/14 0823 10/01/14 0415  NA 134* 134*  K 4.6 4.2  CL 92* 93*  CO2 25 26  GLUCOSE 152* 167*  BUN 20 33*  CREATININE 4.78* 6.03*    Recent Labs  09/30/14 0640 09/30/14 0823  TROPONINI 0.06* 0.06*   Hepatic Function Panel  Recent Labs  09/29/14 2310  PROT 7.7  ALBUMIN 2.8*  AST 15  ALT 8  ALKPHOS 58  BILITOT 1.5*    Recent Labs  09/30/14 0640  CHOL 115   No results for input(s): PROTIME in the last 72  hours.  Imaging: Imaging results have been reviewed and Dg Chest Port 1 View  09/30/2014   CLINICAL DATA:  Encounter for central line placement.  EXAM: PORTABLE CHEST - 1 VIEW  COMPARISON:  09/29/2014  FINDINGS: New left internal jugular central venous line has its tip in the mid superior vena cava. The junction of the brachiocephalic vein and superior vena cava.  No pneumothorax.  Left greater than right lung base opacity is again noted, without change, likely atelectasis. No pulmonary edema.  IMPRESSION: 1. Left internal jugular central venous catheter tip projects in the mid superior vena cava. No pneumothorax. No other change from the prior day's study.   Electronically Signed   By: Lajean Manes M.D.   On: 09/30/2014 16:26   Dg Chest Port 1 View  09/29/2014   CLINICAL DATA:  Shortness of breath.  Initial encounter.  EXAM: PORTABLE CHEST - 1 VIEW  COMPARISON:  None.  FINDINGS: 2207 hour. The heart size and mediastinal contours are normal. There is patchy retrocardiac opacity which may reflect atelectasis. The right lung is clear. There is no edema, significant pleural effusion or pneumothorax. Metallic vascular stent is noted in the left upper arm. No acute osseous findings seen.  IMPRESSION: Patchy left lower lobe atelectasis or early infiltrate.  No edema.   Electronically Signed   By: Richardean Sale M.D.   On: 09/29/2014 21:23    Cardiac Studies:  Assessment/Plan:  Acute coronary syndrome Status post left cardiac cath  at [facility] with critical ostial LAD stenosis with ruptured plaque with TIMI 3 distal flow. Strep bacteremia ? source rule out endocarditis Hypertension Diabetes mellitus End-stage renal disease on hemodialysis Hypercholesteremia History of gouty arthritis Anemia of chronic disease Plan As per orders Transfer to telemetry Transfuse one unit of packed RBC during hemodialysis Check 2-D echo Will arrange for TEE for Monday Check labs in a.m.  LOS: 2  days    Jaleisa Brose N 10/01/2014, 10:29 AM

## 2014-10-02 LAB — BASIC METABOLIC PANEL
Anion gap: 11 (ref 5–15)
BUN: 16 mg/dL (ref 6–23)
CO2: 29 mmol/L (ref 19–32)
Calcium: 7.5 mg/dL — ABNORMAL LOW (ref 8.4–10.5)
Chloride: 94 mmol/L — ABNORMAL LOW (ref 96–112)
Creatinine, Ser: 3.86 mg/dL — ABNORMAL HIGH (ref 0.50–1.35)
GFR calc Af Amer: 18 mL/min — ABNORMAL LOW (ref >90)
GFR calc non Af Amer: 15 mL/min — ABNORMAL LOW (ref >90)
Glucose, Bld: 127 mg/dL — ABNORMAL HIGH (ref 70–99)
Potassium: 3.4 mmol/L — ABNORMAL LOW (ref 3.5–5.1)
Sodium: 134 mmol/L — ABNORMAL LOW (ref 135–145)

## 2014-10-02 LAB — CBC
HCT: 28.3 % — ABNORMAL LOW (ref 39.0–52.0)
Hemoglobin: 9.2 g/dL — ABNORMAL LOW (ref 13.0–17.0)
MCH: 23.8 pg — AB (ref 26.0–34.0)
MCHC: 32.5 g/dL (ref 30.0–36.0)
MCV: 73.1 fL — AB (ref 78.0–100.0)
PLATELETS: 221 10*3/uL (ref 150–400)
RBC: 3.87 MIL/uL — ABNORMAL LOW (ref 4.22–5.81)
RDW: 19.8 % — AB (ref 11.5–15.5)
WBC: 7.3 10*3/uL (ref 4.0–10.5)

## 2014-10-02 LAB — TYPE AND SCREEN
ABO/RH(D): O POS
Antibody Screen: NEGATIVE
Unit division: 0
Unit division: 0

## 2014-10-02 LAB — CULTURE, BLOOD (SINGLE)

## 2014-10-02 LAB — GLUCOSE, CAPILLARY
GLUCOSE-CAPILLARY: 152 mg/dL — AB (ref 70–99)
Glucose-Capillary: 117 mg/dL — ABNORMAL HIGH (ref 70–99)

## 2014-10-02 MED ORDER — AMLODIPINE BESYLATE 5 MG PO TABS
5.0000 mg | ORAL_TABLET | Freq: Every day | ORAL | Status: DC
Start: 2014-10-02 — End: 2014-11-05

## 2014-10-02 MED ORDER — NITROGLYCERIN 0.4 MG SL SUBL
0.4000 mg | SUBLINGUAL_TABLET | SUBLINGUAL | Status: DC | PRN
Start: 2014-10-02 — End: 2018-02-27

## 2014-10-02 MED ORDER — ATORVASTATIN CALCIUM 40 MG PO TABS: 40.0000 mg | ORAL_TABLET | Freq: Every day | ORAL | Status: DC

## 2014-10-02 MED ORDER — RENA-VITE PO TABS: 1.0000 | ORAL_TABLET | Freq: Every day | ORAL | Status: DC

## 2014-10-02 MED ORDER — CLOPIDOGREL BISULFATE 75 MG PO TABS: 75.0000 mg | ORAL_TABLET | Freq: Every day | ORAL | Status: AC

## 2014-10-02 NOTE — Discharge Instructions (Signed)
Coronary Angiogram with Stent °Coronary angiography with stent placement is a procedure to widen or open a narrow blood vessel of the heart (coronary artery). When a coronary artery becomes partially blocked, it decreases blood flow to that area. This may lead to chest pain or a heart attack (myocardial infarction). Arteries may become blocked by cholesterol buildup (plaque) in the lining or wall.  °A stent is a small piece of metal that looks like a mesh or a spring. Stent placement may be done right after a coronary angiography in which a blocked artery is found or as a treatment for a heart attack.  °LET YOUR HEALTH CARE PROVIDER KNOW ABOUT: °· Any allergies you have.   °· All medicines you are taking, including vitamins, herbs, eye drops, creams, and over-the-counter medicines.   °· Previous problems you or members of your family have had with the use of anesthetics.   °· Any blood disorders you have.   °· Previous surgeries you have had.   °· Medical conditions you have. °RISKS AND COMPLICATIONS °Generally, coronary angiography with stent is a safe procedure. However, problems can occur and include: °· Damage to the heart or its blood vessels.   °· A return of blockage.   °· Bleeding, infection, or bruising at the insertion site.   °· A collection of blood under the skin (hematoma) at the insertion site. °· Blood clot in another part of the body.   °· Kidney injury.   °· Allergic reaction to the dye or contrast used.   °· Bleeding into the abdomen (retroperitoneal bleeding). °BEFORE THE PROCEDURE °· Do not eat or drink anything after midnight on the night before the procedure or as directed by your health care provider.  °· Ask your health care provider about changing or stopping your regular medicines. This is especially important if you are taking diabetes medicines or blood thinners. °· Your health care provider will make sure you understand the procedure as well as the risks and potential problems  associated with the procedure.   °PROCEDURE °· You may be given a medicine to help you relax before and during the procedure (sedative). This medicine will be given through an IV tube that is put into one of your veins.   °· The area where the catheter will be inserted will be shaved and cleaned. This is usually done in the groin but may be done in the fold of your arm (near your elbow) or in the wrist.    °· A medicine will be given to numb the area where the catheter will be inserted (local anesthetic).   °· The catheter will be inserted into an artery using a guide wire. A type of X-ray (fluoroscopy) will be used to help guide the catheter to the opening of the blocked artery.   °· A dye will then be injected into the catheter, and X-rays will be taken. The dye will help to show where any narrowing or blockages are located in the heart arteries.   °· A tiny wire will be guided to the blocked spot, and a balloon will be inflated to make the artery wider. The stent will be expanded and will crush the plaque into the wall of the vessel. The stent will hold the area open like a scaffolding and improve the blood flow.   °· Sometimes the artery may be made wider using a laser or other tools to remove plaque.   °· When the blood flow is better, the catheter will be removed. The lining of the artery will grow over the stent, which stays where it was placed.   °  AFTER THE PROCEDURE  If the procedure is done through the leg, you will be kept in bed lying flat for about 6 hours. You will be instructed to not bend or cross your legs.   The insertion site will be checked frequently.   The pulse in your feet or wrist will be checked frequently.   Additional blood tests, X-rays, and electrocardiography may be done. Document Released: 12/08/2002 Document Revised: 10/18/2013 Document Reviewed: 12/10/2012 [facility] Patient Information 2015 [place], [place]. This information is not intended to replace advice given to you  by your health care provider. Make sure you discuss any questions you have with your health care provider. Acute Coronary Syndrome Acute coronary syndrome (ACS) is an urgent problem in which the blood and oxygen supply to the heart is critically deficient. ACS requires hospitalization because one or more coronary arteries may be blocked. ACS represents a range of conditions including:  Previous angina that is now unstable, lasts longer, happens at rest, or is more intense.  A heart attack, with heart muscle cell injury and death. There are three vital coronary arteries that supply the heart muscle with blood and oxygen so that it can pump blood effectively. If blockages to these arteries develop, blood flow to the heart muscle is reduced. If the heart does not get enough blood, angina may occur as the first warning sign. SYMPTOMS   The most common signs of angina include:  Tightness or squeezing in the chest.  Feeling of heaviness on the chest.  Discomfort in the arms, neck, back, or jaw.  Shortness of breath and nausea.  Cold, wet skin.  Angina is usually brought on by physical effort or excitement which increase the oxygen needs of the heart. These states increase the blood flow needs of the heart beyond what can be delivered.  Other symptoms that are not as common include:  Fatigue  Unexplained feelings of nervousness or anxiety  Weakness  Diarrhea  Sometimes, you may not have noticed any symptoms at all but still suffered a cardiac injury. TREATMENT   Medicines to help discomfort may include nitroglycerin (nitro) in the form of tablets or a spray for rapid relief, or longer-acting forms such as cream, patches, or capsules. (Be aware that there are many side effects and possible interactions with other drugs).  Other medicines may be used to help the heart pump better.  Procedures to open blocked arteries including angioplasty or stent placement to keep the arteries  open.  Open heart surgery may be needed when there are many blockages or they are in critical locations that are best treated with surgery. HOME CARE INSTRUCTIONS   Do not use any tobacco products including cigarettes, chewing tobacco, or electronic cigarettes.  Take one baby or adult aspirin daily, if your health care provider advises. This helps reduce the risk of a heart attack.  It is very important that you follow the angina treatment prescribed by your health care provider. Make arrangements for proper follow-up care.  Eat a heart healthy diet with salt and fat restrictions as advised.  Regular exercise is good for you as long as it does not cause discomfort. Do not begin any new type of exercise until you check with your health care provider.  If you are overweight, you should lose weight.  Try to maintain normal blood lipid levels.  Keep your blood pressure under control as recommended by your health care provider.  You should tell your health care provider right away about any  increase in the severity or frequency of your chest discomfort or angina attacks. When you have angina, you should stop what you are doing and sit down. This may bring relief in 3 to 5 minutes. If your health care provider has prescribed nitro, take it as directed.  If your health care provider has given you a follow-up appointment, it is very important to keep that appointment. Not keeping the appointment could result in a chronic or permanent injury, pain, and disability. If there is any problem keeping the appointment, you must call back to this facility for assistance. SEEK IMMEDIATE MEDICAL CARE IF:   You develop nausea, vomiting, or shortness of breath.  You feel faint, lightheaded, or pass out.  Your chest discomfort gets worse.  You are sweating or experience sudden profound fatigue.  You do not get relief of your chest pain after 3 doses of nitro.  Your discomfort lasts longer than 15  minutes. MAKE SURE YOU:   Understand these instructions.  Will watch your condition.  Will get help right away if you are not doing well or get worse.  Take all medicines as directed by your health care provider. Document Released: 06/03/2005 Document Revised: 06/08/2013 Document Reviewed: 10/05/2013 [facility] Patient Information 2015 [place], [place]. This information is not intended to replace advice given to you by your health care provider. Make sure you discuss any questions you have with your health care provider.

## 2014-10-02 NOTE — Evaluation (Addendum)
Physical Therapy Evaluation Patient Details Name: Rudolph Daoust MRN: [id number] DOB: Jan 21, 1951 Today's Date: 10/02/2014   History of Present Illness  Admitted from [place] with acute coronary syndrome, now s/p cardiac cath with PCI; PMH significant for ESRD with HD, DM , gout  Clinical Impression  Patient evaluated by Physical Therapy with no further acute PT needs identified, as he is to dc home today. He does present with generalized weakness and deconditioning;   All education has been completed and the patient has no further questions.  See below for any follow-up Physical Therapy or equipment needs. PT is signing off. Thank you for this referral.     Follow Up Recommendations Home health PT; [facility] for chronic disease management    Equipment Recommendations  3in1 (PT)    Recommendations for Other Services       Precautions / Restrictions Precautions Precautions: Fall Precaution Comments: Fall risk significantly lessened with use of RW      Mobility  Bed Mobility Overal bed mobility: Needs Assistance Bed Mobility: Supine to Sit;Sit to Supine     Supine to sit: Min assist Sit to supine: Supervision   General bed mobility comments: min handheld assist to pull to sit  Transfers Overall transfer level: Needs assistance Equipment used: Rolling walker (2 wheeled) Transfers: Sit to/from Stand Sit to Stand: Min guard (without physical contact)         General transfer comment: Cues for hand placement; heavy reliance on UE push, indicative of LE weakness  Ambulation/Gait Ambulation/Gait assistance: Min guard (without physical contact) Ambulation Distance (Feet): 175 Feet (2 seated rest breaks) Assistive device: Rolling walker (2 wheeled) Gait Pattern/deviations: Step-through pattern;Trunk flexed     General Gait Details: cues to self-monitor for activity tolerance  Stairs Stairs: Yes Stairs assistance: Min guard Stair Management: Two rails Number of Stairs:  5 General stair comments: Noted heavy reliance on UEs for supoprt and to pull self up each step; LE weakness very apparent  Wheelchair Mobility    Modified Rankin (Stroke Patients Only)       Balance Overall balance assessment: No apparent balance deficits (not formally assessed)                                           Pertinent Vitals/Pain Pain Assessment: Faces Faces Pain Scale: Hurts little more Pain Location: R UE with using rail going up/downstairs Pain Descriptors / Indicators: Aching;Grimacing Pain Intervention(s): Monitored during session;Repositioned    Home Living Family/patient expects to be discharged to:: Private residence Living Arrangements: Spouse/significant other;Children Available Help at Discharge: Family;Available 24 hours/day (if not 24 hours/day, very near 24 hours/day) Type of Home: House Home Access: Stairs to enter Entrance Stairs-Rails: Psychiatric nurse of Steps: 5 Home Layout: One level Home Equipment: Walker - 2 wheels      Prior Function Level of Independence: Independent with assistive device(s)               Hand Dominance        Extremity/Trunk Assessment   Upper Extremity Assessment: Generalized weakness;Overall WFL for tasks assessed           Lower Extremity Assessment: Generalized weakness (weakness more apparent with negotiating steps)         Communication   Communication: Prefers language other than English  Cognition Arousal/Alertness: Awake/alert Behavior During Therapy: WFL for tasks assessed/performed Overall Cognitive Status: Within Functional  Limits for tasks assessed                      General Comments General comments (skin integrity, edema, etc.): O2 sats 100% on Room Air; HR ranged 65 to 75 bpm    Exercises        Assessment/Plan    PT Assessment All further PT needs can be met in the next venue of care  PT Diagnosis Generalized weakness    PT Problem List Decreased strength;Decreased activity tolerance;Decreased balance;Decreased mobility;Decreased knowledge of use of DME;Decreased knowledge of precautions  PT Treatment Interventions     PT Goals (Current goals can be found in the Care Plan section) Acute Rehab PT Goals Patient Stated Goal: hopes to go home PT Goal Formulation: All assessment and education complete, DC therapy    Frequency     Barriers to discharge        Co-evaluation               End of Session   Activity Tolerance: Patient tolerated treatment well Patient left: in bed;with family/visitor present Nurse Communication: Mobility status;Other (comment) (ok for dc home; would like HHPT follow up)         Time: 1348-1420 PT Time Calculation (min) (ACUTE ONLY): 32 min   Charges:   PT Evaluation $Initial PT Evaluation Tier I: 1 Procedure PT Treatments $Gait Training: 8-22 mins   PT G Codes:        Quin Hoop 10/02/2014, 2:41 PM  Roney Marion, [place] Pager [phone] Office [phone]

## 2014-10-02 NOTE — Progress Notes (Signed)
CARE MANAGEMENT NOTE 10/02/2014  Patient:  [name], [name]   Account Number:  [id number]  Date Initiated:  09/30/2014  Documentation initiated by:  Elissa Hefty  Subjective/Objective Assessment:   adm w pos card cath     Action/Plan:   lives w wife, Minus Brigner # [phone] ,  pcp dr Lavonia Dana   Anticipated DC Date:  10/02/2014   Anticipated DC Plan:  [place]  CM consult      Choice offered to / List presented to:          [facility] arranged  [place]      [place].   Status of service:  Completed, signed off Medicare Important Message given?  YES (If response is "NO", the following Medicare IM given date fields will be blank) Date Medicare IM given:  09/30/2014 Medicare IM given by:  GRAVES-BIGELOW,BRENDA Date Additional Medicare IM given:  10/02/2014 Additional Medicare IM given by:  Jonnie Finner  Discharge Disposition:  [place]  Per UR Regulation:  Reviewed for med. necessity/level of care/duration of stay  If discussed at [place] of Stay Meetings, dates discussed:    Comments:  10/02/2014 1500 NCM spoke to pt and offered choice for [facility]. Provided HHA list. Wife agreeable to [facility] for [facility]. Pt has RW at home. Contacted [facility] for [facility] referral for scheduled dc home today. Jonnie Finner RN CCM Case Mgmt phone [phone]  09/30/2014 1500 Chart reviewed.  Waiting final recommendations for home. Jonnie Finner RN CCM Case Mgmt phone [phone]

## 2014-10-02 NOTE — Progress Notes (Signed)
Assessment:  1 ESRD ([place] [facility]) 2 Anemia s/p PRBC 3 CAD s/p PTCA Plan: 1 Schedule routine HD for Tues  Subjective: Interval History: Stable, as questioned through interpreter  Objective: Vital signs in last 24 hours: Temp:  [97.5 F (36.4 C)-98.7 F (37.1 C)] 98.5 F (36.9 C) (04/17 0851) Pulse Rate:  [57-98] 66 (04/17 0851) Resp:  [16-18] 17 (04/17 0851) BP: (127-154)/(49-68) 154/52 mmHg (04/17 0851) SpO2:  [98 %-100 %] 98 % (04/17 0851) Weight change: 0.919 kg (2 lb 0.4 oz)  Intake/Output from previous day: 04/16 0701 - 04/17 0700 In: 935 [P.O.:600; Blood:335] Out: 3000  Intake/Output this shift: Total I/O In: 480 [P.O.:480] Out: 0   General appearance: alert and cooperative Chest wall: no tenderness GI: soft, non-tender; bowel sounds normal; no masses,  no organomegaly Extremities: LUE AV access pos thrill  Lab Results:  Recent Labs  10/01/14 0415 10/02/14 0400  WBC 6.4 7.3  HGB 7.3* 9.2*  HCT 22.7* 28.3*  PLT 234 221   BMET:  Recent Labs  10/01/14 0415 10/02/14 0400  NA 134* 134*  K 4.2 3.4*  CL 93* 94*  CO2 26 29  GLUCOSE 167* 127*  BUN 33* 16  CREATININE 6.03* 3.86*  CALCIUM 7.7* 7.5*   No results for input(s): PTH in the last 72 hours. Iron Studies: No results for input(s): IRON, TIBC, TRANSFERRIN, FERRITIN in the last 72 hours. Studies/Results: Dg Chest Port 1 View  09/30/2014   CLINICAL DATA:  Encounter for central line placement.  EXAM: PORTABLE CHEST - 1 VIEW  COMPARISON:  09/29/2014  FINDINGS: New left internal jugular central venous line has its tip in the mid superior vena cava. The junction of the brachiocephalic vein and superior vena cava.  No pneumothorax.  Left greater than right lung base opacity is again noted, without change, likely atelectasis. No pulmonary edema.  IMPRESSION: 1. Left internal jugular central venous catheter tip projects in the mid superior vena cava. No pneumothorax. No other change from the prior  day's study.   Electronically Signed   By: Lajean Manes M.D.   On: 09/30/2014 16:26    Scheduled: . sodium chloride   Intravenous Once  . amLODipine  5 mg Oral Daily  . aspirin  81 mg Oral Daily  . atorvastatin  40 mg Oral q1800  . calcium acetate  667 mg Oral TID WC  . clopidogrel  75 mg Oral Q breakfast  . feeding supplement (PRO-STAT SUGAR FREE 64)  45 mL Oral TID  . feeding supplement (RESOURCE BREEZE)  1 Container Oral TID BM  . insulin aspart  0-9 Units Subcutaneous TID WC  . metoprolol tartrate  50 mg Oral BID  . multivitamin  1 tablet Oral QHS  . pantoprazole  40 mg Oral Q0600  . sodium chloride  10-40 mL Intracatheter Q12H    LOS: 3 days   Timoteo Carreiro C 10/02/2014,2:45 PM

## 2014-10-02 NOTE — Progress Notes (Signed)
[facility] CVC d/c'ed per order.  Site without signs of bleeding or infection.  Vaseline guaze and dry 2x2 applied to site.  Wife and son at bedside verbalizes understanding of signs of infection, bleeding, dressing change and when to call doctor via [place].

## 2014-10-02 NOTE — Discharge Summary (Signed)
Discharge summary dictated on 10/02/2014 dictation number is [phone]

## 2014-10-02 NOTE — Progress Notes (Signed)
  Echocardiogram 2D Echocardiogram has been performed.  [name] 10/02/2014, 10:44 AM

## 2014-10-02 NOTE — Discharge Summary (Signed)
[name], [name] NO.:  [id number]  MEDICAL RECORD NO.:  [id number]  LOCATION:  6E25C                        FACILITY:  [facility]  PHYSICIAN:  Allegra Lai. Terrence Dupont, M.D. DATE OF BIRTH:  July 24, 1950  DATE OF ADMISSION:  09/29/2014 DATE OF DISCHARGE:  10/02/2014                              DISCHARGE SUMMARY   ADMITTING DIAGNOSES: 1. Acute coronary syndrome status post left cardiac cath at [facility] with critical ostial LAD stenosis with ruptured     plaque with TIMI-3 distal flow. 2. Hypertension. 3. Diabetes mellitus. 4. End-stage renal disease, on hemodialysis. 5. Hypercholesteremia. 6. History of gouty arthritis. 7. Anemia of chronic disease.  DISCHARGE DIAGNOSES: 1. Status post acute coronary syndrome, status post left cardiac cath     at [facility] with critical ostial stenosis with     ruptured plaque, status post PTCA stenting to ostial and proximal     LAD with excellent results. 2. Hypertension. 3. Diabetes mellitus. 4. End-stage renal disease, on hemodialysis. 5. Hypercholesteremia. 6. History of gouty arthritis. 7. Anemia of chronic disease. 8. Questionable polymicrobial bacteremia, felt to be contaminant.  DISCHARGE MEDICATIONS: 1. Amlodipine 5 mg 1 tablet daily. 2. Atorvastatin 40 mg daily. 3. Clopidogrel 75 mg daily. 4. Multivitamin 1 tablet daily. 5. Nitrostat sublingual p.r.n. 6. Allopurinol 100 mg daily. 7. Aspirin 81 mg 1 tablet daily. 8. PhosLo 667 mg 2 capsules 3 times daily. 9. Metoprolol 50 mg twice daily. 10.Zofran as needed as before. 11.Protonix 40 mg daily.  DIET:  Low salt low cholesterol 1800 calories ADA/renal diet.  FOLLOWUP:  Follow up with Dr. Neoma Laming at [facility] in 1 week.  Continue hemodialysis; Tuesday, Thursday, and Saturday as scheduled as before.  CONDITION AT DISCHARGE:  Stable.  Post PTCA stent instructions have been given to the patient's wife and  the patient's condition on discharge is stable.  BRIEF HISTORY:  Mr. Gionfriddo is a 64 year old male with past medical history significant for hypertension, non-insulin dependent diabetes mellitus, controlled by diet, hyperlipidemia, end-stage renal disease, on hemodialysis, anemia of chronic disease, gouty arthritis.  He was admitted at [facility] on September 27, 2014 because of chest pain which woke him up and grade 5/10 radiating to the back associated with shortness of breath, diaphoresis, dizziness.  The patient was noted to have minimally elevated troponin I, subsequently underwent Lexiscan Myoview which showed lateral and inferior wall ischemia with EF of 55%.  The patient subsequently underwent cardiac catheterization at [facility] which showed approximately 40% distal left main stenosis and critical ostial LAD stenosis with ruptured plaque and 60%-70% proximal LAD stenosis.  The patient was transferred here for PCI to LAD.  The patient presently denies any chest pain, but had jaw pain while being transferred from [facility].  The patient denies any shortness of breath.  Denies palpitation, lightheadedness, or syncope.  The patient had peritoneal dialysis in the past, developed peritonitis and subsequently had C. diff colitis for which, he was treated adequately.  PHYSICAL EXAMINATION:  GENERAL:  On examination, he was alert, awake, oriented x3.  He was afebrile.  Hemodynamically stable.  Conjunctivae were pink. NECK:  Supple.  No JVD. LUNGS:  Clear to auscultation without rhonchi or rales. CARDIOVASCULAR:  S1, S2 normal.  There was 2/6 systolic murmur noted. ABDOMEN:  Soft.  Bowel sounds were present.  Nontender. EXTREMITIES:  There is no clubbing, cyanosis, or edema.  Right groin was stable.  LABORATORY DATA:  His sodium was 134, potassium 4.6, BUN 11, creatinine 4.09.  His troponin I was 0.05 which is trending down.  Hemoglobin  was 8, hematocrit 26.4.  His cholesterol was 115, triglycerides 79, HDL 30, LDL was 69.  Post PCI; hemoglobin was 7.3, hematocrit 22.7, white count of 6.4.  Post transfusion of 1 unit of packed RBC; hemoglobin was 9.2 today, hematocrit 28.3, white count of 7.3.  Sodium was 134, potassium 3.4, BUN 16, creatinine 3.86.  EKG done on admission showed normal sinus rhythm with no acute ischemic changes.  BRIEF HOSPITAL COURSE:  The patient was transferred from [facility] to [facility] after dialysis.  The patient subsequently received 1 unit of packed RBCs.  The patient's hemoglobin remained stable after one unit of packed RBCs transfusion after PCI. The patient did lose large amount of blood during the procedure.  The patient also had 2 blood cultures drawn at [facility] which grew strep viridans to coagulase-negative staph and gram-positive rods, which were felt to be contaminant.  Cardiac consultation was obtained.  The patient was initially started on vancomycin and then switched to Rocephin.  The patient remained afebrile during the hospital stay with normal white count.  All his antibiotics have been discontinued.  As above, the patient was successfully underwent PTCA stenting to ostial and proximal LAD with excellent angiographic results.  The patient did not have any further episodes of chest pain during the hospital stay.  The patient did have one episode of agitation and confusion in CCU requiring Ativan which has been discontinued.  The patient today is alert, awake, oriented, and eager to go home.  His both the groins are stable with no evidence of hematoma or bruit.  The patient will be discharged home and will be followed up by Dr. Neoma Laming at [facility] in 1 week.  He will continue with his hemodialysis in [place] as scheduled and follow up with PCP in 2 weeks.     Allegra Lai. Terrence Dupont,  M.D.     MNH/MEDQ  D:  10/02/2014  T:  10/02/2014  Job:  [id number]

## 2014-10-02 NOTE — Progress Notes (Signed)
Medicare Important Message given? YES  (If response is "NO", the following Medicare IM given date fields will be blank)  Date Medicare IM given:  10/02/2014 Medicare IM given by: Jonnie Finner

## 2014-10-02 NOTE — Progress Notes (Signed)
CSW received referral for assistance with medications.   CM notified.  No CSW needs noted.    [facility]  2S, 20M, 5N, 6N, [place], PEDS/PICU [phone]

## 2014-10-03 LAB — HEMOGLOBIN A1C
HEMOGLOBIN A1C: 5.7 % — AB (ref 4.8–5.6)
MEAN PLASMA GLUCOSE: 117 mg/dL

## 2014-10-03 MED FILL — Sodium Chloride IV Soln 0.9%: INTRAVENOUS | Qty: 50 | Status: AC

## 2014-10-04 LAB — PREALBUMIN: PREALBUMIN: 19 mg/dL — AB (ref 21–43)

## 2014-10-06 LAB — CULTURE, BLOOD (ROUTINE X 2)
CULTURE: NO GROWTH
Culture: NO GROWTH

## 2014-10-07 ENCOUNTER — Ambulatory Visit
Admit: 2014-10-07 | Disposition: A | Discharge status: Home / Self Care | Payer: Self-pay | Attending: Internal Medicine | Admitting: Internal Medicine

## 2014-10-07 DIAGNOSIS — Z992 Dependence on renal dialysis: Secondary | ICD-10-CM | POA: Insufficient documentation

## 2014-10-07 DIAGNOSIS — M1000 Idiopathic gout, unspecified site: Secondary | ICD-10-CM | POA: Insufficient documentation

## 2014-10-07 DIAGNOSIS — M109 Gout, unspecified: Secondary | ICD-10-CM | POA: Insufficient documentation

## 2014-10-07 DIAGNOSIS — N189 Chronic kidney disease, unspecified: Secondary | ICD-10-CM

## 2014-10-07 DIAGNOSIS — D631 Anemia in chronic kidney disease: Secondary | ICD-10-CM | POA: Insufficient documentation

## 2014-10-07 DIAGNOSIS — E782 Mixed hyperlipidemia: Secondary | ICD-10-CM | POA: Insufficient documentation

## 2014-10-07 DIAGNOSIS — N186 End stage renal disease: Secondary | ICD-10-CM | POA: Insufficient documentation

## 2014-10-08 NOTE — Consult Note (Signed)
Pt seen and examined. Please see [name]'s notes. Acute diarrhea. Similar episode last year but no bleeding this time. Hx of diffuse diverticuli in 5/13. Stool tests neg so far. Likely infectious colitis. Agree with IV Abx. Hopefully, diarrhea will gradually resolve over next few days, and patient can be discharged to home on oral Abx. However, if there is no signif improvement within next day or two, may need to repeat colonoscopy by Monday. Will follow. Thanks.  Electronic Signatures: Verdie Shire (MD)  (Signed on 13-May-15 15:58)  Authored  Last Updated: 13-May-15 15:58 by Verdie Shire (MD)

## 2014-10-08 NOTE — H&P (Signed)
PATIENT NAME:  [name], [name] MR#:  [id number] DATE OF BIRTH:  03-10-1951  DATE OF ADMISSION:  08/05/2013  PRIMARY CARE PHYSICIAN: Nephrologist at [facility].   REFERRING ER PHYSICIAN:  Dr. Reita Cliche.  CHIEF COMPLAINT: Generalized abdominal pain, nausea and vomiting.  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 She came to the ER with the chief complaint of generalized abdominal pain, associated with nausea and vomiting since yesterday evening. The patient any hematemesis, denies any sick contacts. Denies any dizziness or loss of consciousness. No fevers.  He denies any diarrhea.  The patient's creatinine is elevated at 4.96, but was at 5.47 in June 2015. He has chronic renal insufficiency. The patient's lipase is also elevated at 658. The patient was given 1 liter of fluid bolus and antinausea medication and hospitalist team was called to admit the patient as he was dehydrated. The ER physician has discussed with [facility] who has recommended to hydrate the patient in view of elevated lipase and for intractable nausea and vomiting. Hospitalist team was called to admit the patient. On my examination, the patient is resting comfortably, just complaining of generalized abdominal pain, denies any diarrhea, no fevers, no sick contact, no recent travel.  Wife is at bedside.  PAST MEDICAL HISTORY:  1.  Chronic renal insufficiency, status post right renal transplant in the year 2004 at [facility]. 2.  Hyperlipidemia. 3.  Hypertension. 4.  Type 2 diabetes mellitus. 5.  Gout.   PAST SURGICAL HISTORY: None.  ALLERGIES:  No known drug allergies.  PSYCHOSOCIAL HISTORY: Lives at home with wife.   SOCIAL HISTORY: No smoking, alcohol or illicit drug use.  FAMILY HISTORY: Dad has history of diabetes mellitus.   HOME MEDICATIONS:  1.   Prednisone 5 mg once daily. 2. Tacrolimus 1 mg 2 capsules p.o. once daily. 3.  Sinemet 10 mg p.o. once daily. 4.  Pantoprazole 40 mg 2 times a day. 5. Norco 325/5 1 p.o. q6 hours.  6.  Mycophenolate 2 capsules p.o. every 12 hours.  7.  Metoprolol tartrate 50 mg q.12 hours. 8.  Glipizide 5 mg 1/2 tablet once daily. 9.  Furosemide 40 mg 2 tablets p.o. once daily. 10.  Calcium 867 mg 1 capsule 3 times daily. 11.  Aspirin 81 mg once daily. 12. Amlodipine 10 mg once daily.   REVIEW OF SYSTEMS:  CONSTITUTIONAL: Complaining of weakness and fatigue. EYES: Denies blurry vision, double vision. ENT:  Denies epistaxis, discharge.  RESPIRATION: Denies cough, COPD. CARDIOVASCULAR: No chest pain, palpitations. GASTROINTESTINAL: Complaining of generalized abdominal pain, nausea, vomiting. Denies any diarrhea. No hematemesis, no melena.  GENITOURINARY: No dysuria or hematuria. ENDOCRINE: Denies polyuria, nocturia, thyroid problems. HEMATOLOGIC AND LYMPHATIC: No anemia, easy bruising, bleeding. INTEGUMENTARY: No acne, rash, lesions. MUSCULOSKELETAL: No pain in the neck and back. Complaining of left lower leg pain from a trauma area approximately one week ago.  NEUROLOGIC: Denies vertigo, ataxia. PSYCHIATRIC: No ADD, OCD.  PHYSICAL EXAMINATION: VITAL SIGNS: Temperature 97.1, pulse 70, respiration 61, blood pressure 151/72, pulse ox is 98%. GENERAL APPEARANCE: In no acute distress, moderately built and well nourished.  HEENT: Normocephalic, atraumatic. Pupils are equal, reacting to light and accommodation. No scleral icterus. No conjunctival injection. No sinus tenderness. Dry mucous membranes. No postnasal drip.  NECK: Supple. No JVD, no thyromegaly. Range of motion is intact.  LUNGS: Clear to auscultation bilaterally. No  accessory muscle use is noted. CHEST: Clear to palpation. CARDIAC: S1, S2 normal. Regular rate and rhythm, no murmurs.  ABDOMEN: Soft. Bowel sounds are positive in all four  quadrants. Generalized tenderness is present, no rebound tenderness. No masses. NEUROLOGIC: Awake, oriented x 3. Cranial nerves II through XII are grossly intact. Motor and sensory grossly intact. Following verbal command. Reflexes are 2+. EXTREMITIES: No edema, no cyanosis, no clubbing. SKIN: Warm to touch, normal turgor. No rashes, no lesions.  MUSCULOSKELETAL: Left lower extremity: on the medial aspect of the lower leg is a small 3 x 3 cm swelling present from hematoma from recent trauma. Does not seem to be infected. It is tender to touch. Peripheral pulses are 2+. PSYCHIATRIC: Normal mood and affect.   LABORATORIES AND IMAGING STUDIES: Accu-Chek 101.  BUN 113, creatinine 4.96 which was at 5.47 in June of 2014. Sodium 135, potassium 4.4, chloride 101, CO2 24, anion gap is 10. GFR 12. Serum osmolality of 316. Lipase is elevated at 658. LTFs: total protein 8.9, alkaline phosphatase is 118, rest of the LFTs are normal. CBC is normal, except for hemoglobin which is low at 9.3 and hematocrit 30.7. Urinalysis is clear in appearance. Nitrites and leukocytes are negative. EKG shows sinus tachycardia at 106 beats per minute, right axis deviation. Nonspecific ST wave changes are present.   ASSESSMENT AND PLAN:  A 64 year old Hispanic male presented to the ER with the chief complaint of generalized abdominal pain, nausea and vomiting, will be admitted with the following assessment and plan: 1.  Acute gastritis/pancreatitis. Acute gastritis is viral etiology. Acute pancreatitis etiology is unclear; it is mild in nature. We will keep him n.p.o. Will give IV fluids as recommended by nephrology. Provide intravenous Protonix. 2.   Antinausea medication will be given. We will check a.m. labs including fasting lipid panel and lipase. The patient denies any alcohol intake. We will get right upper quadrant ultrasound to rule out gastritis versus pancreatitis. 3.  Chronic renal insufficiency, status post kidney  transplant patient. ER physician has discussed with [facility] who recommended intravenous fluids.  Nephrology consult is placed to doctor on call. 4.  Diabetes mellitus. The patient will be on sliding scale insulin. 5. Hypertension, currently the patient is unclear. We will hold off on his blood pressure medications and home medications.  6.  Hyperlipidemia. Check fasting lipid panel in a.m.  7.  We will provide gastrointestinal prophylaxis with Protonix and deep venous thrombosis prophylaxis with Heparin subcutaneous. 8.  The plan of care was discussed with the patient; he is aware of the plan.   Total time spent on admission:  40 minutes.     ____________________________ Nicholes Mango, MD ag:NTS D: 08/06/2013 01:18:00 ET T: 08/06/2013 03:39:02 ET JOB#: [id number]  cc: Nicholes Mango, MD, <Dictator> Nicholes Mango MD ELECTRONICALLY SIGNED 08/12/2013 18:50

## 2014-10-08 NOTE — H&P (Signed)
PATIENT NAME:  [name], [name] MR#:  [id number] DATE OF BIRTH:  28-Feb-1951  DATE OF ADMISSION:  04/21/2014  REFERRING PHYSICIAN: Briant Sites. Joni Fears, MD  PRIMARY CARE PHYSICIAN: [facility].   CHIEF COMPLAINT: Fever.  HISTORY OF PRESENT ILLNESS: This is a 64 year old gentleman with a history of end-stage renal disease status post renal transplant back in 2004 who recently restarted hemodialysis September 2015 and he is presenting with a fever. He describes 2-day duration of fevers. With the assistance of a translator, the patient describes fever, chills of 2 days' duration with associated generalized weakness and fatigue. He also complains of having abdominal pain, right lower quadrant in location, radiating to the entirety of the abdomen, described only as "pain." Intensity 8 to 9 out of 10, worse with movement. No relieving factors. Of note, recently treated about 4 weeks ago at [facility] for an infected transplanted kidney. At that time, per their culture data, he was treated with Zosyn; however, the remainder of their information is pending at this time. The case was discussed with [facility] while the patient is still in the Emergency Department. Currently their hospital is on diversion; however, he will be placed on the transport list when it becomes available. 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  REVIEW OF SYSTEMS: CONSTITUTIONAL: Positive for fevers, chills, fatigue, weakness.  EYES: Denies blurred vision, double vision, eye pain.  EARS, NOSE, THROAT: Denies tinnitus, ear pain, hearing loss.  RESPIRATORY: Denies cough, wheeze, shortness of breath.  CARDIOVASCULAR: Denies chest pain, palpitations, edema.  GASTROINTESTINAL: Denies nausea, vomiting, diarrhea. Positive for abdominal pain.  GENITOURINARY: Denies dysuria, hematuria.  ENDOCRINE: Denies nocturia or thyroid  problems. HEMATOLOGIC AND LYMPHATIC: Denies easy bruising and bleeding.  SKIN: Denies rashes or lesions.  MUSCULOSKELETAL: Denies pain in neck, back, shoulder, knees, hips, or arthritic symptoms.  NEUROLOGIC: Denies paralysis, paresthesias.  PSYCHIATRIC: Denies anxiety or depressive symptoms.  Otherwise, full review of systems performed by me is negative.   PAST MEDICAL HISTORY: Hypertension, hyperlipidemia, type 2 diabetes, non-insulin requiring; however, complicated by end-stage renal disease, end-stage renal disease status post renal transplant back in 2004, recently restarted on hemodialysis back in September 2015.  SOCIAL HISTORY: No alcohol, tobacco, or drug usage.  FAMILY HISTORY: Positive for diabetes.  ALLERGIES: IV CONTRAST DYE.   HOME MEDICATIONS: Include prednisone 5 mg p.o. b.i.d., aspirin 81 mg p.o. daily, oxycodone 1 to 2 capsules every 6 hours as needed for pain, glipizide 5 mg 1/2 tablet p.o. daily, allopurinol 100 mg p.o. daily, simvastatin 10 mg p.o. at bedtime, metoprolol 50 mg p.o. daily, Lasix 40 mg p.o. 3 times daily, mycophenolate mofetil 250 mg p.o. b.i.d., tacrolimus 1 mg p.o. at bedtime, Feosol 324 mg p.o. daily, PhosLo 667 mg p.o. 3 times daily with meals, pantoprazole 40 mg p.o. daily.   PHYSICAL EXAMINATION:  VITAL SIGNS: Temperature currently 100.9; per EMS, 102 degrees Fahrenheit. Heart rate 85, respirations 29, blood pressure 110/57, saturating 92% on room air. GENERAL: Well-nourished gentleman currently in minimal distress given abdominal pain.  HEAD: Normocephalic, atraumatic.  EYES: Pupils equal, round, reactive to light. Extraocular muscles intact. No scleral icterus.  MOUTH: Dry mucosal membranes. Dentition intact. No abscess noted. EARS, NOSE, THROAT: Clear without exudates. No external lesions.  NECK: Supple. No thyromegaly. No nodules. No JVD.  PULMONARY: Clear to auscultation bilaterally without wheezes, rales, or rhonchi. No use of accessory  muscles.  Good respiratory effort.  CHEST: Nontender to palpation.  CARDIOVASCULAR: S1, S2, regular rate and rhythm. No murmurs, rubs, or gallops. No edema. Pedal pulses 2+ bilaterally. GASTROINTESTINAL: Soft. Tenderness to palpation over the right lower quadrant. Positive for voluntary guarding; however, no rebound or motion tenderness. Positive bowel sounds. No hepatosplenomegaly noted.  MUSCULOSKELETAL: No swelling, clubbing, or edema. Range of motion full in all extremities.  NEUROLOGIC: Cranial nerves II through XII intact. No gross focal neurological deficits. Sensation intact. Reflexes intact.  SKIN: No ulceration, lesions, rash, cyanosis. Skin warm, dry. Turgor intact.  PSYCHIATRIC: Mood and affect within normal limits. Patient awake, alert, oriented x 3. Insight and judgment are intact.   LABORATORY DATA: CT, abdomen and pelvis, performed revealing transplanted kidney more  edematous and enlarged compared to prior CT scan on 03/25/2014 with perinephric fat stranding suggesting rejection or infection. Remainder of laboratory data: Sodium 137, potassium 3.4, chloride 94, bicarbonate of 31, BUN 20, creatinine 3.30, glucose 90, albumin of 2.9. Troponin of 0.03. WBC 25.6, hemoglobin 8, platelets of 244,000. Lactic acid of 3.8.   ASSESSMENT AND PLAN: A 64 year old gentleman with a history of end-stage renal disease status post renal transplant back in 2004 recently restarted on hemodialysis back in September 2015 presenting with generalized weakness as well as fever.  1.  Sepsis: Meeting septic criteria by temperature, leukocytosis, respiratory rate on arrival. Source is the transplanted kidney. Panculture including blood and urine. Antibiotic coverage with vancomycin and Zosyn. Will follow culture data; taper antibiotics when available. Intravenous fluid hydration to keep mean arterial pressure greater than 65. Currently, systolic blood pressure greater than 90. No altered mental status. Does not meet  sepsis protocol; however, will provide stress-dose steroids, as he has longstanding chronic steroid usage. The case was once again discussed with [facility], who is currently on diversion. He will be placed on transport list when it becomes available. Also, case was discussed with [facility], mentioning a history of CMV; however, no treatment required, though recommendation to check CMV PCR.  2.  End-stage renal disease currently on hemodialysis: History of renal transplant. Remains on immunosuppressive agents. We will check a tacrolimus level. He has completed hemodialysis for today. Will consult nephrology.  3.  Type 2 diabetes complicated by end-stage renal disease: Hold p.o. agents. Add insulin sliding scale, q. 6 hour Accu-Cheks. 4.  Hypertension: Norvasc.  5.  Venous thromboembolism prophylaxis with heparin subcutaneous.  CODE STATUS: The patient is full code.   TIME SPENT: 55 minutes.    ____________________________ Aaron Mose. Hower, MD dkh:ST D: 04/21/2014 21:28:44 ET T: 04/21/2014 21:48:43 ET JOB#: [id number]  cc: Aaron Mose. Hower, MD, <Dictator> DAVID Woodfin Ganja MD ELECTRONICALLY SIGNED 04/22/2014 3:08

## 2014-10-08 NOTE — Consult Note (Signed)
PATIENT NAME:  [name], [name] MR#:  [id number] DATE OF BIRTH:  [birth year]  DATE OF CONSULTATION:  04/22/2014  REFERRING PHYSICIAN:  Theodoro Grist, MD, requesting physician   CONSULTING PHYSICIAN:  Cheral Marker. [name], MD  REASON FOR CONSULT:  Sepsis.   HISTORY OF PRESENT ILLNESS:  This is a pleasant 63 year old gentleman with history of end-stage renal disease with a cadaveric transplant that is currently failing. He was admitted November 5 with fevers and chills  as well as weakness and fatigue and right lower quadrant abdominal pain. Of note, he was recently at [facility] for pyelonephritis with Enterobacter bacteremia and was discharged on a 14 day course of ciprofloxacin. He is getting dialysis now through a left forearm graft. He is going to be started eventually on peritoneal dialysis as well. He has been covered broadly with antibiotics since admission and we are consulted for further antibiotic management.   PAST MEDICAL HISTORY:  1.  End-stage renal disease.  2.  Failed transplant in 2004. This was a cadaveric transplant.  3.  He has history of CMV as well, type 2 diabetes, hypertension.   SOCIAL HISTORY: No tobacco, alcohol or drug use. He is originally from [place].   FAMILY HISTORY: Noncontributory.   REVIEW OF SYSTEMS: Eleven systems reviewed and negative except as per HPI.   ALLERGIES: HE IS ALLERGIC TO CONTRAST DYE.   MEDICATIONS: Antibiotics since admission include vancomycin and Zosyn. He is also on prednisone and mycophenolate. He is also on tacrolimus.   PHYSICAL EXAMINATION:  VITAL SIGNS: Temperature 97.9, T-max was 101.2 yesterday, pulse 73, down from 124, blood pressure 104/49, respirations 20, saturating 95% on room air.  GENERAL: He is pleasant, chronically ill appearing.  HEENT: Pupils equal, round, reactive to light and accommodation. Extraocular movements are intact. Sclerae anicteric. Oropharynx clear.  NECK: Supple.  HEART: Regular.  LUNGS: Clear.  ABDOMEN: Soft, mild  tender to palpation right lower quadrant. No rebound or guarding.  EXTREMITIES: No clubbing, cyanosis or edema.  VASCULAR: He has a left forearm fistula. On his abdomen he has peritoneal dialysis catheter, which appears normal.   DATA AND IMAGING: CT of the chest November 5 showed transplant kidney is more edematous and enlarged compared to CT October 2015. There is soft tissue stranding in the perinephric fat. There are no other acute abnormalities. Chest x-ray was negative. White blood count on admission was 25,000 and it remains 25,000 with 23,000 neutrophils today. Blood cultures x 2 no growth to date. Basic panel is consistent with end-stage renal disease. Troponins negative. LFTs were normal except for low albumin at 2.9.   IMPRESSION: A 64 year old gentleman with end-stage renal disease, currently on dialysis through a left forearm graft who also has a peritoneal dialysis catheter in place and a  failed cadaveric kidney in his right lower quadrant. He is on immunosuppressives. He is readmitted with fever after episode of Enterobacter bacteremia and pyelonephritis in October, status post treatment with ciprofloxacin. A CT scan shows worsening edema and perinephric stranding around the transplanted kidney. His white count is 25,000. He was febrile and tachycardic and septic when he came in. Blood cultures are pending.   RECOMMENDATIONS:  1.  Continue vancomycin and Zosyn.  2.  Check CMV PCR.  3.  Consider transfer to [facility] for further evaluation. I suspect he has another bacteremic episode. Other ETIOLOGIES  in the differential could include LYMPHOproliferative disease or CMV or transplant acute rejection. No evidence of PCP at this time.   Thank you for the  consult. I will be glad to follow with you.    ____________________________ Cheral Marker. [name], MD dpf:AT D: 04/22/2014 22:06:30 ET T: 04/23/2014 01:53:57 ET JOB#: [id number]  cc: Cheral Marker. [name], MD, <Dictator> Hye Trawick [name]  MD ELECTRONICALLY SIGNED 04/26/2014 17:37

## 2014-10-08 NOTE — Consult Note (Signed)
Chief Complaint:  Subjective/Chief Complaint Overall better. No nausea. Diarrhea continues.   VITAL SIGNS/ANCILLARY NOTES: **Vital Signs.:   14-May-15 11:08  Vital Signs Type Q 4hr  Temperature Temperature (F) 98.1  Celsius 36.7  Temperature Source oral  Pulse Pulse 67  Respirations Respirations 18  Systolic BP Systolic BP 195  Diastolic BP (mmHg) Diastolic BP (mmHg) 80  Mean BP 106  Pulse Ox % Pulse Ox % 96  Pulse Ox Activity Level  At rest  Oxygen Delivery Room Air/ 21 %   Brief Assessment:  GEN no acute distress   Cardiac Regular   Respiratory clear BS   Gastrointestinal Normal   Lab Results: Routine Chem:  13-May-15 06:02   Glucose, Serum  146  BUN  110  Creatinine (comp)  5.09  Sodium, Serum 140  Potassium, Serum 4.0  Chloride, Serum  110  CO2, Serum 21  Calcium (Total), Serum  7.8  Anion Gap 9  Osmolality (calc) 317  eGFR (African American)  13  eGFR (Non-African American)  11 (eGFR values <32mL/min/1.73 m2 may be an indication of chronic kidney disease (CKD). Calculated eGFR is useful in patients with stable renal function. The eGFR calculation will not be reliable in acutely ill patients when serum creatinine is changing rapidly. It is not useful in  patients on dialysis. The eGFR calculation may not be applicable to patients at the low and high extremes of body sizes, pregnant women, and vegetarians.)  Routine Hem:  13-May-15 06:02   WBC (CBC) 5.8  RBC (CBC)  3.31  Hemoglobin (CBC)  6.9  Hematocrit (CBC)  23.1  Platelet Count (CBC) 199  MCV  70  MCH  20.9  MCHC  30.0  RDW  16.1  Neutrophil % 73.9  Lymphocyte % 8.2  Monocyte % 17.2  Eosinophil % 0.3  Basophil % 0.4  Neutrophil # 4.3  Lymphocyte #  0.5  Monocyte # 1.0  Eosinophil # 0.0  Basophil # 0.0 (Result(s) reported on 27 Oct 2013 at 06:48AM.)   Assessment/Plan:  Assessment/Plan:  Assessment Diarrhea, infectious?   Plan Continue Abx and see if diarrhea improves. Will follow.  Thanks.   Electronic Signatures: Verdie Shire (MD)  (Signed 14-May-15 13:21)  Authored: Chief Complaint, VITAL SIGNS/ANCILLARY NOTES, Brief Assessment, Lab Results, Assessment/Plan   Last Updated: 14-May-15 13:21 by Verdie Shire (MD)

## 2014-10-08 NOTE — Discharge Summary (Signed)
PATIENT NAME:  [name], [name] MR#:  [id number] DATE OF BIRTH:  July 11, 1950  DATE OF ADMISSION:  04/21/2014 DATE OF DISCHARGE:  04/23/2014  TRANSFER SUMMARY  DATE OF TRANSFER TO [name]: 04/23/2014  PRIMARY CARE PHYSICIAN: [place] [facility].  CHIEF COMPLAINT AT THE TIME OF ADMISSION: Fever.   ADMITTING DIAGNOSES:  1.  Sepsis.  2.  End-stage renal disease.  3.  Type 2 diabetes mellitus.  4.  Hypertension.   TRANSFER DIAGNOSES: 1.  Sepsis, unclear etiology, can be acute transplant rejection versus bacteremia.  2.  End-stage renal disease, on hemodialysis, last hemodialysis was done on November 7.  3.  Leukocytosis. It can be from sepsis and also the patient is on steroids.  4.  Diabetes mellitus.  5.  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   CONSULTATIONS: Nephrology, infectious disease.   BRIEF HISTORY AND PHYSICAL AND HOSPITAL COURSE: The patient is a 64 year old male with a history of end-stage renal disease, status post renal transplant back in the year 2004, was recently restarted on hemodialysis in September 2015, came into the ED with a chief complaint of 2 day history of fever. Also presented with a 2 day history of chills, generalized weakness and fatigue. The patient was also complaining of right lower quadrant abdominal pain radiating to the entire abdomen with intensity 8/10. The patient was recently treated about 4 weeks ago at [name] for infected transplanted kidney. At that point, as per the culture data, the patient was treated with Zosyn. This case was because with [name] while the patient was at [name] by the ED physician, Dr. Joni Fears. As [name] was currently in diversion the patient was placed under the transfer list. Case was discussed with [name] nephrology. Given the history of CMV, they have recommended to check CMV, PCR but no current treatment. Please review history and physical for details. The patient  was admitted to the hospital with the diagnosis of sepsis and cultures were obtained. He was started on broad-spectrum antibiotics, vancomycin and IV Zosyn. At the time of admission no altered mental status was noted. The patient was started on stress dose steroids. Nephrology and infectious disease were consulted.   During the hospital course, the patient was evaluated by infectious disease and nephrology. Cultures were followed. IV Zosyn and vancomycin were continued. Hemodialysis was continued during the hospital course. The patient's condition was somewhat stable during the hospital course. As the patient was having intermittent episodes of hypotension, his Norvasc was held. Dr. [name], the infectious disease physician, has recommended to continue IV Zosyn and vancomycin. He has recommended transfer the patient to [name] for further evaluation soon after bed is available. CMV, PCR was done which is pending. On November 7, the patient had hemodialysis done and subsequently as a bed is available he was transferred to [name] to Dr. [name] service in stable condition. The patient has received and 1 unit of blood transfusion prior to transfer on November 7, as hemoglobin was at 6.6. The patient's immunosuppressive drugs, tacrolimus and CellCept were continued during the hospital course.  CONDITION AT THE TIME OF TRANSFER: Stable.   VITAL SIGNS: Temperature 98.6, pulse 74, respirations 20, blood pressure is 137/61, pulse rate 74-79, pulse oximetry 93% on room air.   LABORATORY DATA: The patient's BMP: Glucose 269, BUN 88, creatinine 7.33, sodium and potassium are normal. Chloride 97, CO2 of 23, GFR 8, anion gap is 16. Serum osmolality 308, calcium 7.9, phosphorus 6.2, albumin 2.2. WBC 20.3,  hemoglobin 6.6, hematocrit is 22.2, platelet count 224,000.    Microbiology: Blood cultures have revealed no growth x2 , these were collected on November 5. Lactic acid level was at 3.8 on November 5. The  patient is O+, antibody negative. CAT scan of the abdomen and pelvis without contrast was done on November 5, which has revealed transplant kidney is more edematous and  compared to the prior CT scan dated 03/25/2014 with persistent soft tissue stranding in the perinephric fat around the transplant. This suggests either rejection or infection. No other acute abnormality. Appendix is normal. Numerous diverticula in the colon including the rectum.   MEDICATIONS AT THE TIME OF TRANSFER: The plan is to continue vancomycin 750 mg, Zosyn 3.375 mg every 12 hours, Zofran 4 mg IV q. 4 hours as needed for nausea and vomiting. Heparin 5000 units subcutaneous every 8 hours, insulin sliding scale, Tylenol 325 mg 2 tablets p.o. every 4 hours as needed for pain and temperature, methylprednisolone 60 mg IV every 8 hours, tacrolimus 1 mg p.o. once a day, aspirin 81 mg once daily, allopurinol 100 mg once every other day, mycophenolate mofetil 250 mg 1 capsule 2 times a day, pantoprazole 40 mg once daily, calcium 1 capsule p.o. 3 times a day, amlodipine 10 mg once daily, metoprolol tartrate 50 mg once daily, simvastatin 10 mg 1 tablet p.o. at bedtime, iron sulfate 325 mg 1 tablet p.o. once daily.   DIET: Carb controlled renal diet.   ACTIVITY: As tolerated.   FOLLOWUP: With primary care physician in 1-2 weeks after discharge from [name]. Followup with nephrology and the other physicians as recommended by [facility].   Plan of care was discussed with the patient. He is in agreement with the plan.   TOTAL TIME SPENT ON THE TRANSFER PROCESS: Forty-five minutes.   CMV, PCR results need to be followed up by [facility] physicians and they are to follow the culture results which are pending.   ____________________________ Nicholes Mango, MD ag:TT D: 04/25/2014 16:51:24 ET T: 04/25/2014 19:30:51 ET JOB#: [id number]  cc: Nicholes Mango, MD, <Dictator> Mamie Levers, MD [facility] Cheral Marker. [name],  MD   Nicholes Mango MD ELECTRONICALLY SIGNED 05/03/2014 [id number]

## 2014-10-08 NOTE — Consult Note (Signed)
Chief Complaint:  Subjective/Chief Complaint Diarrhea stopped last night. No vomiting. Eating ok.   VITAL SIGNS/ANCILLARY NOTES: **Vital Signs.:   15-May-15 13:48  Vital Signs Type Routine  Temperature Temperature (F) 97.9  Celsius 36.6  Temperature Source oral  Pulse Pulse 73  Respirations Respirations 18  Systolic BP Systolic BP 161  Diastolic BP (mmHg) Diastolic BP (mmHg) 82  Mean BP 109  Pulse Ox % Pulse Ox % 96  Pulse Ox Activity Level  At rest  Oxygen Delivery Room Air/ 21 %   Brief Assessment:  GEN no acute distress   Cardiac Regular   Respiratory normal resp effort  clear BS   Gastrointestinal Normal   Lab Results: Routine Chem:  14-May-15 04:57   Glucose, Serum  125  BUN  100  Creatinine (comp)  5.19  Sodium, Serum 143  Potassium, Serum 4.0  Chloride, Serum  116  CO2, Serum  19  Calcium (Total), Serum  7.8  Anion Gap 8  Osmolality (calc) 318  eGFR (African American)  13  eGFR (Non-African American)  11 (eGFR values <64m/min/1.73 m2 may be an indication of chronic kidney disease (CKD). Calculated eGFR is useful in patients with stable renal function. The eGFR calculation will not be reliable in acutely ill patients when serum creatinine is changing rapidly. It is not useful in  patients on dialysis. The eGFR calculation may not be applicable to patients at the low and high extremes of body sizes, pregnant women, and vegetarians.)  Ferritin ([facility]) 209 (Result(s) reported on 28 Oct 2013 at 02:32PM.)  Iron Binding Capacity (TIBC)  213  Unbound Iron Binding Capacity 196  Iron, Serum  17  Iron Saturation 8 (Result(s) reported on 28 Oct 2013 at 02:34PM.)  15-May-15 08:46   Glucose, Serum  157  BUN  75  Creatinine (comp)  4.37  Sodium, Serum 141  Potassium, Serum 3.9  Chloride, Serum  114  CO2, Serum  20  Calcium (Total), Serum  8.0  Anion Gap 7  Osmolality (calc) 307  eGFR (African American)  16  eGFR (Non-African American)  13 (eGFR values  <665mmin/1.73 m2 may be an indication of chronic kidney disease (CKD). Calculated eGFR is useful in patients with stable renal function. The eGFR calculation will not be reliable in acutely ill patients when serum creatinine is changing rapidly. It is not useful in  patients on dialysis. The eGFR calculation may not be applicable to patients at the low and high extremes of body sizes, pregnant women, and vegetarians.)  Routine Hem:  14-May-15 04:57   WBC (CBC) 4.7  RBC (CBC)  3.21  Hemoglobin (CBC)  6.8  Hematocrit (CBC)  22.4  Platelet Count (CBC) 182  MCV  70  MCH  21.3  MCHC  30.4  RDW  16.4  Neutrophil % 70.2  Lymphocyte % 12.2  Monocyte % 16.6  Eosinophil % 0.7  Basophil % 0.3  Neutrophil # 3.3  Lymphocyte #  0.6  Monocyte # 0.8  Eosinophil # 0.0  Basophil # 0.0 (Result(s) reported on 28 Oct 2013 at 05:54AM.)   Assessment/Plan:  Assessment/Plan:  Assessment Colitis. Resolving.   Plan Continue Abx. If continues to stay stable, ok for discharge on oral Abx. thanks.   Electronic Signatures: OhVerdie ShireMD)  (Signed 15-May-15 14:28)  Authored: Chief Complaint, VITAL SIGNS/ANCILLARY NOTES, Brief Assessment, Lab Results, Assessment/Plan   Last Updated: 15-May-15 14:28 by OhVerdie ShireMD)

## 2014-10-08 NOTE — Op Note (Signed)
PATIENT NAME:  [name], MARCHANT MR#:  [id number] DATE OF BIRTH:  January 01, 1951  DATE OF PROCEDURE:  03/04/2014  PREOPERATIVE DIAGNOSIS: End-stage renal disease requiring hemodialysis.   POSTOPERATIVE DIAGNOSIS: End-stage renal disease requiring hemodialysis.   PROCEDURE PERFORMED: Laparoscopic-assisted insertion of peritoneal dialysis catheter.   SURGEON: Katha Cabal, MD.   ANESTHESIA: General by endotracheal intubation.   FLUIDS: Per anesthesia record.   ESTIMATED BLOOD LOSS: Minimal.   SPECIMEN: None.   INDICATIONS: 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 He is, therefore, requiring returning to dialysis and has elected to proceed with peritoneal. The risks and benefits were reviewed. All questions answered. The patient has agreed to proceed.   DESCRIPTION OF PROCEDURE: The patient is taken to the operating room and placed in the supine position. After adequate general anesthesia is induced and appropriate invasive monitors were placed, he is positioned supine and his abdomen is prepped and draped in a sterile fashion. Appropriate timeout is called. A small vertical incision is made just above the umbilicus and the dissection is carried down to the fascia. The fascia was hooked with a bone hook, elevated and various needle is inserted without difficulty. Saline test is positive and low-flow insufflation is performed. Subsequently, high flow CO2 is insufflated to 15 mmHg pressure. A 5 mm trocar is then introduced and the camera is advanced. Inspection of the peritoneal contents appears normal.   A small oblique incision is made in the left lower quadrant and carried down to expose the fascia and 0 Vicryl pursestring suture is placed and under direct laparoscopic visualization, the Seldinger needle is inserted into the peritoneal cavity. A J-wire is advanced, followed by the dilator and peel-away sheath. The catheter is then  advanced through the peel-away sheath, over a stylet and positioned deep within the pelvis in the midline. The gas is expelled from the peritoneal cavity and the catheter is observed under visualization. Subsequently, the camera and the trocar were removed. The catheter is then secured by pushing the cuff subfascially and securing the pursestring suture. The figure-of-eight 0 Vicryl suture is also used to secure the trocar site.   The catheter is then pulled subcutaneously so that it exits approximately 2 fingerbreadths below the inferior costal margin and 240 mL of saline is infused. Approximately 200 is returned and subsequently, the titanium connector is placed and then the extender with the Betadine cap. Both incisions are then closed using 3-0 Vicryl in several layers followed by 4-0 Monocryl subcuticular and then Dermabond. Sterile dressing is placed around the exit site. The patient tolerated the procedure well. There were no immediate complications. He was taken to the recovery area in excellent condition.     ____________________________ Katha Cabal, MD ggs:TT D: 03/04/2014 14:28:25 ET T: 03/04/2014 18:10:29 ET JOB#: [id number]  cc: Katha Cabal, MD, <Dictator> Katha Cabal MD ELECTRONICALLY SIGNED 04/04/2014 20:43

## 2014-10-08 NOTE — H&P (Signed)
PATIENT NAME:  [name], [name] MR#:  [id number] DATE OF BIRTH:  June 01, 1951  DATE OF ADMISSION:  10/26/2013  PRIMARY CARE PROVIDER: [facility]   REFERRING PHYSICIAN:  Dr. Conni Slipper  CHIEF COMPLAINT: Fever, chills, diarrhea, nausea.   HISTORY OF PRESENT ILLNESS: The patient is a 64 year old male with history of chronic renal failure, status post right-sided kidney transplant in 2004. Also has a history of diabetes, hypertension, and gout. He was doing well up until yesterday when he all of a sudden developed diarrhea. He had diarrhea throughout the night. He has had multiple liquidy bowel movements and also had nausea and vomiting. The patient also started having chills and a fever of 100. The patient came to the ED and had a CT scan of the abdomen, which showed thickening of the colon and portion of the small bowel, suggestive of possible enterocolitis. The patient has not recently been on any antibiotics. No recent travel. Nobody else is sick at the house. He did have a similar type of presentation in February, but at that time he was thought to have gastroenteritis. The patient otherwise denies any chest pain, shortness of breath, or palpitations.   PAST MEDICAL HISTORY:  1.  Chronic renal failure, status post right renal transplant in 2004, is on immunosuppressants.  2.  Hyperlipidemia.  3.  Hypertension.  4.  Type 2 diabetes.  5.  Gout.   PAST SURGICAL HISTORY: Status post renal transplant.   ALLERGIES: None.   MEDICATIONS THAT HE BROUGHT WITH HIM: He is still on prednisone 5 mg daily, tacrolimus 1 mg 2 caps p.o. daily, mycophenolate 3 capsules p.o. q. 12, allopurinol 100 daily, metoprolol tartrate 50 q. 12, glipizide 5 mg 1/2 tablet daily, Lasix 40 mg 2 tabs in the morning and 1 tab in the evening, aspirin 81 mg 1 tab p.o. daily, calcium acetate 867 mg 1 capsule t.i.d., amlodipine 10 daily.   SOCIAL HISTORY: Does not smoke. Does not drink. No drugs.   FAMILY HISTORY: Father with  history of diabetes.   REVIEW OF SYSTEMS:   CONSTITUTIONAL: Has complaint of fever, fatigue, weakness. No pain. No weight loss. No weight gain.  EYES: No blurred or double vision. No redness. No inflammation. No glaucoma.  ENT: No tinnitus. No ear pain. No hearing loss. No seasonal or year-round allergies. No difficulty swallowing.  RESPIRATORY: Denies any cough, wheezing, hemoptysis. No painful respiration.  CARDIOVASCULAR: Denies any chest pain, orthopnea, edema, or arrhythmia.  GASTROINTESTINAL: Complains of nausea, vomiting, diarrhea. No abdominal pain. No hematemesis. No melena. No guarding. No IBS.  GENITOURINARY: Denies any dysuria, hematuria, renal calculus or frequency.  ENDOCRINE: Denies any polyuria, nocturia or thyroid problems. HEMATOLOGIC/LYMPHATIC:  Has chronic anemia. No easy bruisability or bleeding.  MUSCULOSKELETAL: Denies any pain in neck, back, or shoulder.  NEUROLOGIC: No CVA, TIA, seizures.  PSYCHIATRIC: No anxiety, insomnia, or ADD.   PHYSICAL EXAMINATION: VITAL SIGNS: Temperature initially was 98.5, but according to the nurse, recent temperature was 101.5, pulse of 78, respirations 18, blood pressure 154/73, O2 was 100%.  GENERAL: The patient is a well-developed male. He is having active chills.   HEENT: Head atraumatic, normocephalic. Pupils equally round, react to light and accommodation. There is no conjunctival pallor. No scleral icterus. Nasal exam shows no drainage or ulceration. Oropharynx is clear, without any exudate.  NECK: Supple, without any JVD.  CARDIOVASCULAR: Regular rate and rhythm. No murmurs, rubs, clicks, or gallops.  LUNGS: Clear to auscultation bilaterally, without any rales, rhonchi, wheezing.  ABDOMEN:  Soft, nontender, nondistended. Positive bowel sounds x 4.  EXTREMITIES: No clubbing, cyanosis, or edema.  SKIN: No rash.  LYMPHATICS: No lymph nodes palpable.   VASCULAR: Good DP, PT pulses.  PSYCHIATRIC: Not anxious or depressed.   NEUROLOGIC: Awake, alert, oriented x 3. No focal deficits.   EVALUATIONS: Glucose 188, BUN 116, creatinine 5.04, sodium 138, potassium 3.8, chloride 107, CO2 is 20, calcium 8.6, lipase 497. LFTs are normal. WBC 7.8, hemoglobin 7.9, platelet count 229. Urinalysis is negative. CT scan of the abdomen and pelvis shows majority of the colon and portion of the small bowel with wall thickening, which may be related to under- distention, although enterocolitis not excluded. Atrophic kidneys, dilated gallbladder, without CT evidence of inflammation.   ASSESSMENT AND PLAN: The patient is a 64 year old male status post renal transplant on immunosuppressants, presents with chills, diarrhea, nausea, vomiting. CT scan suggestive of possible colitis.   1.  Diarrhea, nausea, vomiting. Possibly due to enterocolitis. At this time, will treat him with IV Cipro, Flagyl. Stool studies, blood cultures. GI consult. Also send for C. diff.   2.  Diabetes. Will place him on sliding scale. Since it is unlikely that he is going to be taking much p.o., I will not continue his glipizide.   3. Renal transplant, with chronic renal failure. Will continue tacrolimus, prednisone, mycophenolate. Will give him IV fluids. Hold his Lasix.   4.  Hypertension. Hold Lasix. Will continue amlodipine, metoprolol.   5.  Anemia. Likely anemia of chronic disease. Has chronic anemia. Will send studies for iron, B12, folate, and guaiac his stools.   6.  Miscellaneous. In light of significant anemia, will just do TED hose for deep vein thrombosis prophylaxis. Hold Lovenox or heparin.  TIME SPENT: 55 minutes.     ____________________________ Lafonda Mosses Posey Pronto, MD shp:mr D: 10/26/2013 19:07:39 ET T: 10/26/2013 19:24:43 ET JOB#: [id number]  cc: Kelii Chittum H. Posey Pronto, MD, <Dictator> Alric Seton MD ELECTRONICALLY SIGNED 11/01/2013 12:13

## 2014-10-08 NOTE — Op Note (Signed)
PATIENT NAME:  [name], [name] MR#:  [id number] DATE OF BIRTH:  1950-09-24  DATE OF PROCEDURE:  03/14/2014  PREOPERATIVE DIAGNOSES:  1.  End-stage renal disease.  2.  Clotted left arm arteriovenous graft.  3.  Failed kidney transplant, having to resume dialysis.  4.  Hypertension.  5.  Diabetes.  6.  Hyperlipidemia.   POSTOPERATIVE DIAGNOSES:  1.  End-stage renal disease.  2.  Clotted left arm arteriovenous graft.  3.  Failed kidney transplant, having to resume dialysis.  4.  Hypertension.  5.  Diabetes.  6.  Hyperlipidemia.   PROCEDURES:  1.  Ultrasound guidance for vascular access to left arm AV graft in both an antegrade and retrograde fashion.  2.  Left upper extremity shuntogram and central venogram.  3.  Catheter directed thrombolysis with 4 mg of tPA to the graft in the brachial and axillary veins.  4.  Mechanical rheolytic thrombectomy to the graft in the brachial and axillary veins.  5.  Fogarty embolectomy for arterial plug with a 5 Fogarty embolectomy balloon.  6.  Percutaneous transluminal angioplasty with a 7 mm diameter conventional and high-pressure angioplasty balloon to the arterial anastomosis.  7.  Percutaneous transluminal angioplasty of arterial access site with 7 mm diameter angioplasty balloon.  8.  Percutaneous transluminal angioplasty of mid graft with 7 mm diameter angioplasty balloon.  9.  Percutaneous transluminal angioplasty of venous access site with 7 mm diameter angioplasty balloon.  10. Percutaneous transluminal angioplasty of venous anastomosis with 7 mm diameter angioplasty balloon.  11. Percutaneous transluminal angioplasty of brachial vein and axillary vein with 7 and 8 mm diameter angioplasty balloons.  12. Viabahn-covered stent placement to cover the arterial plug just beyond the arterial anastomosis to the arterial access site for residual thrombus and stenosis in these locations.  13. Viabahn-covered stent placement to the venous access site and  venous anastomosis into the brachial vein with a 7 mm diameter Viabahn-covered stent.  14. Viabahn-covered stent placement to the brachial and axillary veins for residual stenosis and thrombus after angioplasty with an 8 mm diameter Viabahn-covered stent.   SURGEON: Algernon Huxley, MD.   ANESTHESIA: Local with moderate conscious sedation.   BLOOD LOSS: 50 mL   FLUOROSCOPY TIME:  Approximately 20 minutes and 72 mL of contrast were used.   INDICATION FOR PROCEDURE: This is a 64 year old [place] male with end-stage renal disease.  He had a kidney transplant for the last 11 to 12 years that had worked well and he did not need dialysis.  He was referred to [place] to see if it was possible to reopen the graft in his left arm.  This has been clotted for many years.  We are attempting to open this and if this is unsuccessful, we will place a PermCath.   DESCRIPTION OF PROCEDURE: The patient is brought to the vascular suite.  The left upper extremity was sterilely prepped and draped, and a sterile surgical field was created. The graft was accessed in both an antegrade and a retrograde fashion crossing with micropuncture needles.  A micropuncture wire and sheath were placed.  This was done under ultrasound guidance due to the pulseless nature of the graft, and permanent image was recorded.  The 6 French sheaths were then placed bilaterally and the patient was given a dose of intravenous heparin.  The graft was clotted, and chronically occluded. I had to use a Glidewire to get across the arterial anastomosis and park a Kumpe catheter into the brachial artery.  Imaging was performed, which showed the clotted graft.  It showed normal radial and normal ulnar arteries in the forearm.  I then used a Glidewire and Kumpe catheter to get into the brachial vein, which was also chronically thrombosed.  This was beyond the graft and separate from the graft with a moderate amount of difficulty with a Kumpe catheter and the glide  wire was able to advance all the way into the subclavian vein and confirm intraluminal flow and then place a Magic torque wire.  I allowed 4 mg of tPA to dwell from the arterial anastomosis throughout the entirety of the graft and into the brachial and axillary veins.   After this had dwelled for about 20 minutes, mechanical rheolytic thrombectomy was performed with the AngioJet AVX catheter throughout the graft and in the brachial and axillary veins.  Following this, there was still significant residual thrombus throughout the graft and it was pulseless.  There was a large arterial plug, which was difficult to cross initially and very fibrotic.  I used a 7 mm diameter conventional and high-pressure angioplasty balloon in this area to open the arterial anastomosis and arterial plug; however, there was still residual thrombus, with a Kumpe catheter placed in the brachial artery.  There was no flow within the graft.  I then used a Fogarty embolectomy using an over the wire, 5 Fogarty to dislodge the arterial plug and I opened the in-flow into the graft.  This resulted in significant residual narrowing within 1 to 2 cm of the arterial anastomosis, as well as a lot of thrombus and narrowing at the arterial access site.  It appeared as if there had been graft to graft revision at this site before as well.  A 7 mm diameter high-pressure angioplasty balloon inflated in both locations.  The arterial anastomosis now opened up slightly, but there was still significant narrowing about 1 cm to 1.5 cm beyond the anastomosis and the arterial access site had thrombus and stenosis.  I exchanged for an 0.018 wire and placed a 6 mm diameter x 10 cm length Viabahn stent to just at the arterial anastomosis without going into the brachial artery to avoid limiting flow to the forearm and this covered the arterial access site as well.  This was post-dilated with a 7 mm balloon with a good angiographic completion result.  This allowed me  to remove the retrograde sheath. The antegrade sheath was only about a centimeter beyond the Viabahn stent, and I could do the rest of the work through the antegrade sheath.  There was still much thrombus and narrowing, 7 mm diameter high-pressure angioplasty balloon was inflated in the mid graft.  It was then inflated at the venous access site and at the venous anastomosis.  Despite this, the midportion of the graft actually opened up reasonably nicely, but the venous access site down the venous anastomosis had significant residual thrombus and multiple areas of stenosis.  Also the outflow veins remained thrombosed and stenotic.  I treated the brachial and axillary veins with 7 mm diameter conventional and 8 mm diameter conventional angioplasty balloon in these locations with residual significant stenosis and thrombus still in the brachial and axillary vein separate and distinct from the graft.  I then exchanged for a 7 French sheath and gave another 2000 units of intravenous heparin, placed 0.018 wire into the central venous circulation.  I then used a 7 mm diameter x 25 cm length Viabahn stent that covered the entire venous portion  of the PTFE graft.  The venous anastomosis and then several centimeters into the brachial vein.  This was post dilated with a 7 mm balloon with markedly improved flow of the graft, was now patent, but the venous outflow was still stenotic with thrombus and stenosis in the brachial and axillary veins separate from the graft.  Again, this was dilated with an 8 mm balloon with suboptimal result and greater than 50% residual stenosis was seen in 2 locations in the brachial vein and axillary vein.  To encompass these areas, I bridged an 8 mm diameter x 15 cm length Viabahn-covered stent, from the stent across the venous anastomosis, just into the brachial vein and up to the axillary vein.  This was post dilated with an 8 mm balloon with an excellent angiographic completion result and no  significant residual stenosis was seen.  At this point, the central venous circulation remained patent.  The graft was clinically patent and I elected to terminate the procedure.  The sheath was removed, 4-0 Monocryl pursestring suture was placed. Pressure was held. Sterile dressing was placed.   The patient tolerated the procedure well and was taken to the recovery room in stable condition.     ____________________________ Algernon Huxley, MD jsd:DT D: 03/14/2014 14:25:07 ET T: 03/14/2014 15:32:05 ET JOB#: [id number]  cc: Algernon Huxley, MD, <Dictator> Algernon Huxley MD ELECTRONICALLY SIGNED 03/29/2014 10:20

## 2014-10-08 NOTE — Consult Note (Signed)
Brief Consult Note: Diagnosis: Abdominal pain with associated diarrhea, fever and abnormal CT scan of abdomen and pelvis findings.  Findings felt to be consistent with enterocolitis. Suspect infectious in nature. History of renal failure s/p right kidney transplant.   Consult note dictated.   Discussed with Attending MD.   Comments: Patient's presentation discussed with Dr. Verdie Shire and recommendation is to monitor at this time.  Suspect infectious in nature.  Still pending comprehensive stool study results.  Will continue to monitor.  Continue with antibiotic therapy as ordered, Cipro 400 mg IV as well a Flagyl.    Documentation to act as my addendum as noted in my H & P involving care plan recommendations.  Electronic Signatures: Payton Emerald (NP)  (Signed 13-May-15 15:34)  Authored: Brief Consult Note   Last Updated: 13-May-15 15:34 by Payton Emerald (NP)

## 2014-10-08 NOTE — Discharge Summary (Signed)
PATIENT NAME:  [name], [name] MR#:  [id number] DATE OF BIRTH:  1950/11/15  DATE OF ADMISSION:  10/26/2013 DATE OF DISCHARGE:  10/30/2013  DICTATING PHYSICIAN: Epifanio Lesches, MD  PRIMARY PHYSICIAN: From [facility].   CONSULTATIONS: Seen by nephrology.   HOSPITAL COURSE: This patient is a 64 year old male patient who came in because of fever, chills, diarrhea, nausea. The patient has past medical history significant for renal transplant in 2004 and immunosuppression with CellCept and also prednisone and Prograf. The patient had diarrhea, nausea, vomiting and temperature of 100. Abdominal CAT scan showed colon wall thickening and possible enterocolitis. The patient did not have any sick contacts, and had a similar presentation in February of this year. The patient's vitals on admission showed temperature initially was 98.4, but repeat was 101.5, and other vitals were stable. The patient started on IV Cipro, Flagyl, and GI consult was obtained. The patient's stool cultures were also sent. The patient did receive IV fluids. The patient's stool cultures were negative. The patient continued to have nausea, vomiting and diarrhea the next day as well, and gradually resolved. The patient started on regular diet, and he tolerated the diet. I contacted the nephrologist at [facility]. He suggested that because of nausea, vomiting, diarrhea, he suggested to stop the Prograf, which we stopped, and the patient also had prescription for antibiotics that he could have taken and he could take home. So, his diarrhea resolved, and the patient's nausea and vomiting improved. Stool cultures were negative, and we discharged him home.   The patient has a history of chronic renal failure, history of renal transplant. The patient is almost near hemodialysis. The patient's nephrologist is at [facility]. The patient seen by nephrology, Dr. Anthonette Legato, here. The patient did not have any hyperkalemia, but he did have some acute on  chronic renal failure when he came because of diarrhea and poor p.o. intake. The patient's creatinine was 5.04. BUN was 116. He did receive fluids, and kidney function on May 15th was creatinine 4.37, BUN was 75. So, the patient was making urine, and we discharged him home. Advised him to follow up with his nephrologist at [facility].   The patient has hypertension. His sepsis workup has been negative. We did CMV PCR as well. It was positive; results came back after he  wa discharged.  DISCHARGE MEDICATIONS:  1. Aspirin 81 mg daily.  2. Prednisone 5 mg daily. 3. Allopurinol 100 mg p.o. daily.  3. Amlodipine 10 mg p.o. daily.  4. Calcium acetate 667 mg 1 capsule p.o. t.i.d. 5. Glipizide 5 mg 1/2 tablet daily in the morning before breakfast. 6. Pantoprazole 40 mg p.o. daily.  7. Simvastatin 10 mg p.o. daily. 8. Tacrolimus 1 mg 2 capsules in the morning and 1 capsule in the evening.  9. Furosemide 40 mg 2 tablets in the morning and 1 tablet in the evening. 10. Cipro 500 mg p.o. daily.  11. Flagyl 500 mg every 8 hours.  The patient advised to stop mycophenolate until he sees the nephrologist at [facility]. We gave a prescription for Cipro and Flagyl for 7 days.   PHYSICAL EXAMINATION: VITAL SIGNS: At discharge, temperature 97.8, heart rate 75, blood pressure 165/84, saturation 100% on room air.  GENERAL: The patient was alert, awake, oriented, not in distress.  CARDIOVASCULAR: S1, S2 regular.  LUNGS: Clear to auscultation. GASTROINTESTINAL: Nontender, nondistended. Bowel sounds present.   DISPOSITION: Discharge him home in stable condition. The patient's CMV PCR was sent as advised by the nephrologist at  [facility], but results were not available at the time of discharge. The patient will see his nephrologist at [name].   TIME SPENT: More than 30 minutes.   ____________________________ Epifanio Lesches, MD sk:lb D: 11/10/2013 09:59:52 ET T: 11/10/2013 10:47:30 ET JOB#: [id number]  cc: Epifanio Lesches, MD, <Dictator> Epifanio Lesches MD ELECTRONICALLY SIGNED 11/11/2013 12:20

## 2014-10-08 NOTE — Consult Note (Signed)
PATIENT NAME:  [name], [name] MR#:  [id number] DATE OF BIRTH:  1950/09/02  DATE OF CONSULTATION:  10/27/2013  REFERRING PHYSICIAN:  Dr. Serita Grit CONSULTING PHYSICIAN:  Payton Emerald, NP and Verdie Shire, M.D.  REASON FOR CONSULTATION: Diarrhea.   His primary care is received at [facility].  HISTORY OF PRESENT ILLNESS: [name] is a 64 year old gentleman with significant past medical history of chronic renal failure status post right-sided renal transplant in 2004, history of diabetes, hypertension and gout. He presented to [facility] Emergency Room on 10/26/2013, with a 3 day complaint of diarrhea. Normally, his bowels move, on average, on a daily basis; associated fever up to 102. Denies any evidence of bright red blood per rectum. Significant for nausea and vomiting; last occurrence of vomiting was about 11:30 this morning. He has had 3 bowel movements thus far today. Consistency is loose, watery with a yellow color. He denies any recent travel. No ill contact exposure. No exotic pet exposure. He did eat oysters on Sunday last this past weekend. He had a similar presentation of his symptoms he states in February of this past year. It was thought at that time that he had gastroenteritis.   The patient had a CT scan of abdomen and pelvis that was done without contrast yesterday at 4:30, which revealed the majority of the colon and portions of the small bowel wall thickening, which could represent distention. Enterocolitis could not be excluded in proper clinical setting. Scattered diverticula without focal inflammation was noted. Appendix did not appear inflamed. Atrophic kidneys with low density structures; larger ones which are cysts. Nonobstructing bilateral renal calculi of atrophic native kidneys, transplant kidney, right lower quadrant of the abdomen with a tiny nonobstructing renal calculi and 9 mm cyst noted. Evidence of cardiomegaly, coronary artery calcifications and small hiatal hernia.   Dilated gallbladder without CT evidence of inflammation.   Denies any acid reflux, heartburn. Weight has been stable. The patient does state that he has had a colonoscopy performed in the past, which was done by Dr. Vira Agar, 10/21/2011; findings of diverticulosis, multiple small-mouthed diverticula found in the sigmoid colon, transverse  colon, descending colon, ascending colon and cecum. Internal hemorrhoids noted that were small in size. EGD was done on 08/14/2010; findings of small hiatal hernia, patchy moderate inflammation was characterized by erythema and granularity found in the gastric antrum. Normal examined duodenum. Upper endoscopy was done by Dr. Vira Agar again for the complaint of hematemesis and melena on 05/21/2010. Normal esophagus and gastric ulcer with clean base. Duodenal mucosal atrophy noted.   Blood culture x 2, no growth 8 to 12 hours. C. diff negative. Occult blood in feces is positive. Pending stool comprehensive culture, as well as noted cytomegalovirus PCR detection.   Daughter, who was present, did aid in translation, given language barrier at times.   PAST MEDICAL HISTORY: Chronic renal failure status post right renal transplant in 2004, hyperlipidemia, hypertension, diabetes, gout.   PAST SURGICAL HISTORY:  Status post right renal transplant.   ALLERGIES:  None.   MEDICATIONS:   1.  Prednisone 5 mg daily. 2.  Tacrolimus 1 mg 2 capsules daily.  3.  Mycophenolate 3 capsules every 12 hours.  4.  Allopurinol 100 mg a day.  5.  Metoprolol tartrate 50 mg every 12 hours.  6.  Glipizide 5 mg 1/2 tablet a day.  7.  Lasix 40 mg 2 tablets in the morning, 1 tablet in the evening.  8.  Aspirin 81 mg daily.  9.  Calcium acetate and 867 mg, 1 capsule t.i.d.  10.  Norvasc 10 mg daily.   SOCIAL HISTORY:  No tobacco. No alcohol or recreational drug use.   FAMILY HISTORY: The patient denies any history of diabetes, high blood pressure, heart disease. No history of neoplasm.      REVIEW OF SYSTEMS:   CONSTITUTIONAL:  Significant for fever on admission; fatigue; weakness. No weight loss, weight gain.  EYES: No blurred vision, double vision. No redness. No inflammation or glaucoma.  ENT:  No tinnitus, ear pain, hearing loss.  RESPIRATORY:  No coughing. No wheezing.  CARDIOVASCULAR:  No chest pain or heart palpitations.  GASTROINTESTINAL:  See HPI. GENITOURINARY:  Denies any dysuria or hematuria.  Known history of renal transplant.  ENDOCRINE:  No polyuria or nocturia.  HEMATOLOGIC AND LYMPHATIC: Significant for chronic anemia. Denies any easy bruising and bleeding.  MUSCULOSKELETAL:  Denies any neuralgias or myalgias.  NEUROLOGIC:  No history of CVA or TIA.  PSYCHIATRIC:  No depression. No anxiety.   PHYSICAL EXAMINATION: VITAL SIGNS:  Temperature is 99.2 with a pulse of 69. Respirations are 18. Blood pressure is 150/70 and pulse ox is 94% on room air.  GENERAL:  Well-developed, well-nourished 64 year old gentleman. Daughter present.  Appears mildly ill. HEENT:  Normocephalic, atraumatic. Pupils equal and reactive to light. Conjunctivae clear. Sclerae anicteric.  NECK:  Supple. Trachea midline. No lymphadenopathy or thyromegaly.  PULMONARY:  Symmetric rise and fall of chest. Clear to auscultation throughout.  CARDIOVASCULAR:  Regular rate and rhythm, S1, S2. No murmurs. No gallops.  ABDOMEN:  Soft, nondistended. Bowel sounds in 4 quadrants. No bruits. No masses. Nontender. No rebound tenderness.  RECTAL:  Deferred.  MUSCULOSKELETAL:  Moving all 4 extremities. No contractures. No clubbing.  EXTREMITIES:  No edema.  PSYCHIATRIC:  Alert and oriented x 4. Memory grossly intact. Appropriate affect and mood.  NEUROLOGICAL:  No gross neurological deficits.   LABORATORY, DIAGNOSTIC AND RADIOLOGICAL DATA:  Culture results as noted under history. Iron level low at 16 on admission. Glucose 188. BUN is 116. Creatinine is 5.04. Folic acid is 20.6. CO2 low at 20. EGFR is 11.  Lipase elevated at 497. Glucose improved, today's date, to 146. BUN remains elevated at 110 with a creatinine of 5.09. Hepatic panel within normal limits. CBC on admission: Hemoglobin 7.9 with hematocrit of 25.8. Today, it is 6.9 with hematocrit of 23.1. MCV, MCH and MCHC all low.   IMPRESSION: 1.  Iron deficiency anemia.  2.  Acute onset of diarrhea. Differential inclusive of infectious versus possible inflammatory bowel disease given similar symptoms occurring last year.  3.  End-stage renal disease.  4.  Status post renal transplant.  5.  Hypertension.   PLAN:  Pending additional stool study results, as well as cytomegalovirus PCR detection results. Recommend the continuation of antibiotic therapy at this time.  The patient receiving Cipro 400 mg IV every 24 hours and metronidazole 500 mg every 8 hours.   Addendum to follow.  These services provided by Ebony Cargo, MS, APRN, [facility], FNP under collaborative agreement with Verdie Shire, M.D.  ____________________________ Payton Emerald, NP dsh:dmm D: 10/27/2013 14:30:55 ET T: 10/27/2013 14:57:34 ET JOB#: [id number]  cc: Payton Emerald, NP, <Dictator> Payton Emerald MD ELECTRONICALLY SIGNED 10/27/2013 16:48

## 2014-10-08 NOTE — Discharge Summary (Signed)
PATIENT NAME:  [name], [name] MR#:  [id number] DATE OF BIRTH:  10/18/1950  DATE OF ADMISSION:  08/05/2013  DATE OF DISCHARGE:  08/06/2013  CHIEF COMPLAINT: Nausea, vomiting, abdominal pain.   CONSULTANTS: Dr. Candiss Norse from Nephrology.   DISCHARGE DIAGNOSES: 1.  Acute gastritis, resolved.  2.  Chronic renal insufficiency, being followed by [facility].  3.  Diabetes.  4.  Hypertension.  5.  Hyperlipidemia.  6.  Possible subacute bronchitis.  7.  Chronic anemia.   DISCHARGE MEDICATIONS: Aspirin 81 mg daily, prednisone 5 mg daily, allopurinol 100 mg once a day, amlodipine 10 mg once a day, calcium acetate 667 mg 1 cap 3 times a day, glipizide 5 mg 1/2 tab once a day in the morning before breakfast, metoprolol tartrate 50 mg 1 tab every 12 hours, mycophenolate mofetil 250 mg, please take 2 caps orally every 12 hours, pantoprazole 40 mg 2 times a day, simvastatin 10 mg daily, tacrolimus 1 mg take 2 caps once a day in the morning and 1 cap once a day in the evening, furosemide 40 mg 2 tabs once a day in the morning and 1 tab once a day in the evening, azithromycin 500 mg daily for 5 days.   INSTRUCTIONS:  Diet:  Low sodium, low fat, low cholesterol, ADA diet. Activity as tolerated.  Please follow with your PCP and nephrologist within 1 to 2 weeks.   DISPOSITION: Home.   The patient is FULL CODE.   SIGNIFICANT LABS AND IMAGING: Here, initial glucose 286, BUN 113, creatinine 4.96, sodium 135, potassium 4.4. Initial lipase 668, last one 493. Total protein 8.9, and alk phos 118, otherwise LFTs within normal limits. Initial white count of 8.4, hemoglobin 9.3, platelets 275. Urinalysis does not suggest an infection. Abdominal ultrasound general survey shows a distended gallbladder, normal wall thickness, no gallstones, no pericholecystic fluid or sonographic Murphy sign. CBD is about 6 mm, normal for age. The liver is normal. Pancreas shows partial fatty replacements, but no focal mass lesion,  calcification or ductal dilation. Chest x-ray PA and lateral showing stable, mild cardiomegaly, no active lung disease.   HISTORY OF PRESENT ILLNESS AND HOSPITAL COURSE: For full details of H and P, please see the dictation on February 19 by Dr. Margaretmary Eddy, but briefly, this is a pleasant, 64 year old male with history of chronic renal failure, status post right-sided kidney transplant in 2004, diabetes, hypertension and gout, who came in with complaint of abdominal pain associated with nausea, vomiting since the day before, without any hematemesis or sick contacts. There was no dizziness. The patient had mildly elevated lipase, and got admitted to the hospitalist service for hydration after [facility] had been consulted, who recommended hydration and monitoring of the lipase. He was started on some fluids and anti-emetics. It appears that his chronic renal failure is stable. His symptoms resolved rather quickly, and the lipase is down. I do not think that the patient has acute pancreatitis, as the lipase is not severely elevated, and it could be up secondary to gastritis and his nausea/vomiting, which has resolved. He has no significant abdominal tenderness. He was seen by Nephrology. He is to follow with his outpatient nephrologist at [facility] for further care. Here, he had been complaining of a productive cough for a few weeks. He has no fever or elevation of white count, and an x-ray of the chest did not show any significant pneumonia. I suspect he has more of a subacute bronchitis, and he will be discharged with azithromycin.  PHYSICAL EXAMINATION: VITAL SIGNS: On the day of discharge, his temperature is 97.7, pulse rate 71, respiratory rate 18, blood pressure 154/74, O2 sat 97% on room air.  GENERAL: The patient is a well-developed male sitting at the edge of the bed in no obvious distress, talking in full sentences.  HEENT: Normocephalic, atraumatic.  LUNGS: Clear, without significant wheezing, rhonchi  or rales. CARDIAC:  The patient has normal S1, S2 without significant murmurs.  ABDOMEN: Soft, nontender, nondistended. Positive bowel sounds in all quadrants.  EXTREMITIES:  The patient has trace pitting edema.   PLAN: At this point, he will be discharged with outpatient follow up with his PCP and nephrologist.   Total time spent is 33 minutes.   The patient is a FULL CODE.    ____________________________ Vivien Presto, MD sa:mr D: 08/06/2013 16:42:46 ET T: 08/06/2013 20:53:25 ET JOB#: [id number]  cc: Vivien Presto, MD, <Dictator> Vivien Presto MD ELECTRONICALLY SIGNED 08/20/2013 13:09

## 2014-10-09 ENCOUNTER — Emergency Department
Admit: 2014-10-09 | Disposition: A | Discharge status: Other Acute Inpatient Hospital | Payer: Self-pay | Admitting: Emergency Medicine

## 2014-10-09 LAB — COMPREHENSIVE METABOLIC PANEL
ALK PHOS: 66 U/L
ALT: 9 U/L — AB
Albumin: 3.3 g/dL — ABNORMAL LOW
Anion Gap: 9 (ref 7–16)
BUN: 18 mg/dL
Bilirubin,Total: 0.7 mg/dL
CO2: 31 mmol/L
Calcium, Total: 9 mg/dL
Chloride: 98 mmol/L — ABNORMAL LOW
Creatinine: 5.06 mg/dL — ABNORMAL HIGH
EGFR (African American): 13 — ABNORMAL LOW
EGFR (Non-African Amer.): 11 — ABNORMAL LOW
Glucose: 113 mg/dL — ABNORMAL HIGH
Potassium: 3.9 mmol/L
SGOT(AST): 25 U/L
Sodium: 138 mmol/L
Total Protein: 7.4 g/dL

## 2014-10-09 LAB — CBC
HCT: 29.8 % — ABNORMAL LOW (ref 40.0–52.0)
HGB: 9.5 g/dL — ABNORMAL LOW (ref 13.0–18.0)
MCH: 23.6 pg — ABNORMAL LOW (ref 26.0–34.0)
MCHC: 31.7 g/dL — ABNORMAL LOW (ref 32.0–36.0)
MCV: 74 fL — AB (ref 80–100)
Platelet: 255 10*3/uL (ref 150–440)
RBC: 4 10*6/uL — AB (ref 4.40–5.90)
RDW: 20.7 % — ABNORMAL HIGH (ref 11.5–14.5)
WBC: 7.2 10*3/uL (ref 3.8–10.6)

## 2014-10-09 LAB — ETHANOL: Ethanol: <5 mg/dL

## 2014-10-09 LAB — AMMONIA: Ammonia, Plasma: 15 umol/L

## 2014-10-09 LAB — APTT: ACTIVATED PTT: 34.7 s (ref 23.6–35.9)

## 2014-10-09 LAB — PROTIME-INR
INR: 0.9
Prothrombin Time: 12.5 secs

## 2014-10-09 LAB — TROPONIN I: Troponin-I: 0.05 ng/mL — ABNORMAL HIGH

## 2014-10-10 DIAGNOSIS — S065X9A Traumatic subdural hemorrhage with loss of consciousness of unspecified duration, initial encounter: Secondary | ICD-10-CM | POA: Insufficient documentation

## 2014-10-10 DIAGNOSIS — S065XAA Traumatic subdural hemorrhage with loss of consciousness status unknown, initial encounter: Secondary | ICD-10-CM | POA: Insufficient documentation

## 2014-10-11 DIAGNOSIS — N3001 Acute cystitis with hematuria: Secondary | ICD-10-CM | POA: Insufficient documentation

## 2014-10-16 NOTE — H&P (Signed)
PATIENT NAME:  [name], [name] MR#:  [id number] DATE OF BIRTH:  [birth year]  DATE OF ADMISSION:  08/23/2014  PRIMARY CARE PHYSICIAN:  Dr. Juleen China, nephrology.    CHIEF COMPLAINT: Abdominal pain.   HISTORY OF PRESENT ILLNESS: This is a 64 year old man who is having right-sided abdominal pain, worse with any movement, graded 8 out of 10 in intensity, going on for 1 week. Nothing made it better.  Stays on that right side, but diffuse throughout the entire abdomen. He is also feeling dizzy, room spinning. He has been fatigued. He has been vomiting all the time. He has not been eating and he thinks ever since this peritoneal dialysis catheter placement he has not been doing well.  In the ER nephrology consultation was called for this abdominal pain, with the peritoneal dialysis they sent off peritoneal fluid to the laboratory, but still pending at this time. White count was elevated at 13.3. Creatinine was elevated at 21.51 and potassium elevated at 5.9. Hospitalist services were contacted for further evaluation.   PAST MEDICAL HISTORY: End-stage renal disease on hemodialysis, hypertension, hyperlipidemia, gout, gastroesophageal reflux disease, the patient states diabetes but not on any medication for this.   PAST SURGICAL HISTORY: PD catheter placement, kidney transplant, kidney transplant removed secondary to infection, left arm dialysis graft.   ALLERGIES: No known drug allergies.   MEDICATIONS: Include allopurinol 100 mg daily, amlodipine 10 mg daily, aspirin 81 mg daily, calcium acetate 667 mg 2 capsules 3 times a day with meals, ferrous sulfate 325 mg daily, furosemide 80 mg daily, metoprolol 50 mg twice a day, Protonix 40 mg daily, simvastatin 10 mg at bedtime.   SOCIAL HISTORY: No smoking. No alcohol. No drug use. 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 Mother died of a stroke, had also hypertension.    REVIEW OF SYSTEMS:   CONSTITUTIONAL: Positive for fatigue. No fever, chills, or sweats.  EYES: He cannot see well, does not wear glasses.  EARS, NOSE, MOUTH, AND THROAT: Positive for sore throat. No difficulty swallowing.  CARDIOVASCULAR: No chest pain. No palpitations.  RESPIRATORY: Positive for shortness of breath. No cough. No sputum. No hemoptysis.  GASTROINTESTINAL: Positive for nausea. Positive for vomiting. Positive for abdominal pain. Positive for diarrhea. No bright red blood per rectum. No melena.  GENITOURINARY: No burning on urination.  MUSCULOSKELETAL: No joint pain or muscle pain.  INTEGUMENT: Itching at peritoneal dialysis catheter site. No rash.  NEUROLOGIC: No fainting or blackouts.  PSYCHIATRIC: No anxiety or depression.  ENDOCRINE: No thyroid problems.  HEMATOLOGIC AND LYMPHATIC: History of anemia.   PHYSICAL EXAMINATION:  VITAL SIGNS: Temperature 98, pulse 64, respirations 18, blood pressure 123/70, pulse oximetry 98% on room air.  GENERAL: No respiratory distress.  EYES: Conjunctivae and lids normal. Pupils equal, round, and reactive to light. Extraocular muscles intact. No nystagmus.  EARS, NOSE, MOUTH, AND THROAT: Tympanic membranes, no erythema. Nasal mucosa, no erythema. Throat, no erythema, no exudate seen. Lips and gums, no lesions.  NECK: No JVD. No bruits. No lymphadenopathy. No thyromegaly. No thyroid nodules palpated.  CARDIOVASCULAR: S1, S2 normal. No gallops, rubs, or murmurs heard. Carotid upstroke 2 + bilaterally. No bruits, Dorsalis pedis pulses 2 + bilaterally. Trace edema of the lower extremity.  RESPIRATORY: Lungs clear to auscultation. No use of accessory muscles to breathe. No rhonchi, rales, or wheeze heard.  GASTROINTESTINAL: Abdomen soft. Positive tenderness throughout the entire abdomen, worse on the  right upper quadrant. No organomegaly/splenomegaly. Normoactive bowel sounds. No masses felt.  LYMPHATIC: No lymph nodes in the neck.   MUSCULOSKELETAL: Trace edema. No clubbing. No cyanosis.  SKIN: No rashes or ulcers seen.  NEUROLOGIC: Cranial nerves II through XII grossly intact. Deep tendon reflexes 2 + bilateral lower extremities.  PSYCHIATRIC: The patient is oriented to person, place, and time.   LABORATORY AND RADIOLOGICAL DATA: Glucose 101, BUN 77, creatinine 21.51, sodium 133, potassium 5.9, chloride 92, CO2 of 26, calcium 10.6. Liver function tests, total protein elevated at 9.2, albumin low at 3.1. Lipase 435. White blood cell count 13.3, H and H  10.3 and 34.3, platelet count of 252,000.   ASSESSMENT AND PLAN:  1.  Acute peritonitis associated with peritoneal dialysis catheter, initial encounter. We will get blood cultures x 2, peritoneal fluid culture. Start empirically on vancomycin and ceftazidime with dialysis to be dosed by pharmacy. With his severe abdominal pain that is generalized I think this is more likely peritonitis rather than pancreatitis. I think the lipase is only borderline.  2.  End-stage renal disease, needing dialysis. Hemodialysis to be set up today by Dr. Juleen China.  The patient has uremic symptoms with nausea, vomiting, creatinine very elevated, also potassium very elevated at 5.9. Dialysis to lower the potassium.  3.  Benign hypertension. Continue usual medications.  4.  Hyperlipidemia, unspecified. Continue statin.  5.  Diabetes. We will just put on sliding scale at this point. Sugar is actually very good.  6.  Gout. Continue low-dose allopurinol.   7.  Gastroesophageal reflux disease without esophagitis. Continue Protonix that he takes at home.   TIME SPENT ON ADMISSION: 55 minutes.   CODE STATUS: The patient is a full code.    ____________________________ Tana Conch. Leslye Peer, MD rjw:bu D: 08/23/2014 14:47:03 ET T: 08/23/2014 15:26:50 ET JOB#: [id number]  cc: Tana Conch. Leslye Peer, MD, <Dictator> Mamie Levers, MD Marisue Brooklyn MD ELECTRONICALLY SIGNED 08/24/2014 10:54

## 2014-10-16 NOTE — H&P (Addendum)
PATIENT NAME:  [name], [name] MR#:  [id number] DATE OF BIRTH:  14-Dec-1950  DATE OF ADMISSION:  09/27/2014  PRIMARY CARE PHYSICIAN: None. He is trying to follow with Dr. Candiss Norse and Dr. Juleen China for nephrology.  CHIEF COMPLAINT: Chest pain.   HISTORY OF PRESENT ILLNESS: This is a 64 year old male with history of ESRD on hemodialysis, hypertension, hyperlipidemia, diet controlled diabetes and gout who comes in because of midsternal chest pain. The patient woke up with chest pain. It is in the mid sternal region, around 5 out of 10 in severity and it was radiating to the back. The patient has no radiation to the arm or left shoulder. No radiation to the neck. Has no cough. No fever. Has shortness of breath and dizziness associated with chest pain. Pain goes into the back and upper back and movement makes it worse. The patient's blood pressure was elevated on arrival. The patient initial blood pressure 177/82, and the patient had chest pain going into the upper back so ER doctor was concerned about possible aortic dissection, but the patient is hemodynamically stable and repeat blood pressure is 149/60. The patient cannot get CT chest because he is allergic to IV dye, so MRA of the chest has been ordered by ER physician. The patient's troponin was elevated slightly at 0.06. Concerning this and chest pain, he is going to be admitted for chest pain rule out.    PAST MEDICAL HISTORY: Significant for admission in March, from March 8th to 15th for peritonitis due to peritoneal dialysis catheter, C. diff infection, and acute diverticulitis. The patient received home antibiotics with vancomycin and ceftazidime with dialysis and also p.o. Flagyl until March 30th for his C. diff colitis. Other past medical problems include diabetes mellitus type II, not on medications at home, hypertension, ESRD on hemodialysis on Tuesday, Thursday, and Saturday, and last dialysis was on Saturday.   PAST SURGICAL HISTORY: Significant for  peritoneal dialysis catheter placement, history of kidney transplant and it was removed due to infection, and left AV dialysis graft.  SOCIAL HISTORY: No smoking, no drinking. Lives with his wife.  FAMILY HISTORY: Father had diabetes, hypertension, and asthma. Mother had stroke.   HOME MEDICATIONS: Simvastatin 10 mg p.o. daily, amlodipine 10 mg daily, pantoprazole 40 mg p.o. daily, allopurinol 100 mg p.o. daily, metoprolol 50 mg p.o. b.i.d., aspirin 81 mg p.o. daily, calcium acetate 667 mg p.o. 2 capsules p.o. t.i.d., Zofran 4 mg every 6 hours as needed for nausea.   REVIEW OF SYSTEMS: CONSTITUTIONAL: Has fatigue and has been having poor p.o. intake secondary to nausea.  EYES: No blurred vision. EARS, NOSE, MOUTH AND THROAT: The patient has no sore throat. No epistaxis.  CARDIOVASCULAR: The patient has chest pain in the midsternal region since this morning radiating to the upper back.  RESPIRATORY: Has shortness of breath. According to the wife, it is chronic but it is more today. No sputum, no cough, no hemoptysis.  GASTROINTESTINAL: Has nausea, poor p.o. intake and has been having to go to have BMs after he eats every time and poor p.o. intake secondary to nausea and not eating much since last discharge. No constipation. No diarrhea. The patient has right lower quadrant abdominal pain which is chronic. GENITOURINARY: No burning on urination.  PSYCHIATRIC: No anxiety or depression.  NEUROLOGICAL: No fainting.  INTEGUMENTARY: The patient has no rashes.  MUSCULOSKELETAL: No joint pains.   PHYSICAL EXAMINATION: VITAL SIGNS: Temperature 97.8, heart rate 63, blood pressure 177/82, sats 99% on room air. The  patient's repeat blood pressure is 149/60, heart rate 66. GENERAL APPEARANCE: A 64 year old male not in distress, answers questions appropriately.  HEAD: Normocephalic, atraumatic.  EYES: Pupils equal and reactive to light. Extraocular movements intact.  EARS, NOSE, AND THROAT: No tympanic  membrane congestion. No turbinate hypertrophy. No oropharyngeal erythema.  NECK: Supple. No JVD. No carotid bruit. No lymphadenopathy. Normal range of motion.  RESPIRATORY: Bilateral breath sounds present. Clear to auscultation. No wheeze noted.  CARDIOVASCULAR: No chest wall tenderness. Regular rate and rhythm. No murmurs. The patient has good pedal pulse, femoral pulse. Trace pedal edema present.  ABDOMEN: Soft, nontender, nondistended. Bowel sounds present. No hernias. The patient has slight right lower quadrant tenderness present on deep palpation.  MUSCULOSKELETAL: Strength 5 out of 5 in upper and lower extremities. No cyanosis. No kyphosis.  SKIN: No skin rashes. No erythema. No nodules.  NEUROLOGIC: Cranial nerves II through XII intact. DTRs 2+ bilaterally. No dysarthria or aphasia. Sensations are intact.  PSYCHIATRIC: Mood and affect are within normal limits.   DIAGNOSTIC DATA: Chest x-ray shows no infiltrates. No edema.   Lactic acid 1. Troponin 0.06. CK total 46, CPK/MB 0.4. WBC 7.8, hemoglobin 7.3, hematocrit 24.6, platelets 261,000. Sodium 142, potassium 3.4, chloride 98, bicarb 32, BUN 17, creatinine 8.78, glucose 85.  EKG: Normal sinus rhythm at 63 beats per minute.   ASSESSMENT AND PLAN:  80.  A 64 year old male with chest pressure going to upper back, slightly elevated troponins. The patient has risk factors of hypertension, endstage renal disease on hemodialysis, and diabetes. Admitted to telemetry under observation for chest pain with possible non-ST-elevation myocardial infarction. Continue his aspirin, beta blockers, statins and nitrates were added to his regimen. Cycle troponins, 2 more sets. Obtain Lexiscan stress test in the morning.  2.  Possible aortic dissection, which is very unlikely because his blood pressure is not elevated and he is hemodynamically stable. MRI of the chest is ordered. Just follow the results.  3.  History of endstage renal disease, on hemodialysis.  Consult nephrology for his hemodialysis and he is on Tuesday, Thursday, and Saturday schedule.  4.  History of recent Clostridium difficile infection and diverticulitis. The patient's WBC is normalized and to finish the treatment for Clostridium difficile. The patient has nausea. We will give him some Reglan and also do sliding scale coverage for his diabetes.   TIME SPENT: About 55 minutes.   ____________________________ Epifanio Lesches, MD sk:sb D: 09/27/2014 11:42:20 ET T: 09/27/2014 12:09:44 ET JOB#: [id number]  cc: Epifanio Lesches, MD, <Dictator> Epifanio Lesches MD ELECTRONICALLY SIGNED 10/14/2014 23:24

## 2014-10-16 NOTE — Consult Note (Signed)
Brief Consult Note: Diagnosis: Nausea and vomiting with diarrhea for last 2 months. Patient's wife says he has these symptoms after each and every meal. He has not been noticed to loose weight as reported by the wife. The patient says he is too tired to answer any questions at this time so the history is taken from the wife.   Patient was seen by consultant.   Consult note dictated.   Comments: N/V/D - family says it started after peritoneal dialysis was changed to hemodialysis. Was admitted with chest pain. Will wait for cardiac rule out. Patient has had symptoms for months without weight loss. May follow up as an out patient for a GI work up.  Electronic Signatures: [name] (MD)  (Signed 13-Apr-16 12:35)  Authored: Brief Consult Note   Last Updated: 13-Apr-16 12:35 by [name] (MD)

## 2014-10-16 NOTE — Discharge Summary (Signed)
PATIENT NAMECHARLE, Hobbs MR#:  [id number] DATE OF BIRTH:  1950/09/28  DATE OF ADMISSION:  08/23/2014 DATE OF DISCHARGE:  08/30/2014  DISCHARGE DIAGNOSES: 1. Peritonitis with peritoneal dialysis catheter culture negative.  2. Clostridium difficile infection.  3. Acute diverticulitis.  4. Acute encephalopathy.  5. End-stage renal disease on hemodialysis.  6. Anemia of chronic disease.  7. Diabetes mellitus type 2.  8. Hypertension.   IMAGING STUDIES: Include a CT scan of the abdomen done, which showed diverticulitis. No abscess, nothing else acute, PD catheter in place.   CONSULTATIONS:  1. Dr. Lucky Cowboy with vascular surgery.  2. Dr. [name] with infectious disease.  3. Dr. Juleen China and Dr. Candiss Norse with nephrology.   ADMITTING HISTORY AND PHYSICAL: Please see detailed H and P dictated by Dr. Leslye Peer. In brief, a 64 year old patient with history of end-stage renal disease on hemodialysis in the past, who had been transitioned to peritoneal dialysis presented the Emergency Room complaining of abdominal pain. He was found to have diverticulitis and possibility of peritonitis from PD catheter, admitted to hospitalist service.   ASSESSMENT AND PLAN: 1. Peritonitis secondary to peritoneal dialysis, which was culture-negative. The patient had fluid sent to the lab, which showed peritonitis, but cultures have been negative. He was on broad-spectrum cefotaxime and vancomycin, with which a full course of 2 weeks will be completed. The patient had the peritoneal dialysis catheter removed by Dr. Lucky Cowboy, as he wanted to continue with hemodialysis in the future. Blood culture is negative. By the time of discharge, the patient's WBC is normal, white count is normal, and afebrile.   2. Clostridium difficile infection. The patient was on Flagyl. He will continue the Flagyl for 10 more days after finishing his IV antibiotics to prevent any recurrence. The patient's diarrhea has resolved. He feels back to normal.   3. Acute diverticulitis shown on CT scan, but the patient is on broad-spectrum antibiotics.  4. End-stage renal disease. The patient has been transitioned to hemodialysis from peritoneal dialysis. He had acute encephalopathy secondary to uremia from inadequate dialysis, which has resolved and he is back to normal.  5. Diabetes and hypertension are stable.   Prior to discharge, the patient's lungs sound clear. S1, S2 heard. Abdomen soft.   DISCHARGE MEDICATIONS: Please refer to medication reconciliation.   ANTIBIOTICS STARTED: Ceftazidime and vancomycin IV, to be given through hemodialysis, with last dose on 09/06/2014, and Flagyl p.o. oral 3 times a day 5 mg until 09/14/2014.   DISCHARGE INSTRUCTIONS: Continual dialysis diet. Activity as tolerated. Follow up with nephrology and Dr. [name].   TIME SPENT TODAY ON DAY OF DISCHARGE IN DISCHARGE ACTIVITY: 40 minutes.    ____________________________ Leia Alf Kenyatta Gloeckner, MD srs:mw D: 08/31/2014 14:50:21 ET T: 08/31/2014 19:16:34 ET JOB#: [id number]  cc: Alveta Heimlich R. Mavryk Pino, MD, <Dictator> Neita Carp MD ELECTRONICALLY SIGNED 09/06/2014 2:21

## 2014-10-16 NOTE — Consult Note (Signed)
PATIENT NAME:  [name], [name] MR#:  [id number] DATE OF BIRTH:  June 26, 1950  DATE OF CONSULTATION:  08/29/2014  REFERRING PHYSICIAN:   CONSULTING PHYSICIAN:  Cheral Marker. [name], MD  REASON FOR CONSULTATION: Peritonitis.  HISTORY OF PRESENT ILLNESS: This is a pleasant 64 year old gentleman with a history of end-stage renal disease, admitted March 8th with abdominal pain. History is obtained through the use of a [place] interpreter. Per report, the patient was having increasing pain, 8 out of 10, for over 1 week. He does use peritoneal dialysis, but he had felt symptoms  were worsening. The patient had white count sent from his peritoneal dialysis, which revealed 800 whites. He was admitted for probable abdominal catheter-associated peritonitis. Since admission the patient has been on broad-spectrum antibiotics. Cultures have all been negative. He has also been diagnosed with C. diff. Peritoneal dialysis catheter was removed today and he can initiate hemodialysis.   PAST MEDICAL HISTORY: 1.  End-stage renal disease with prior history of dialysis, recently started on peritoneal dialysis.  2.  Hypertension.  3.  Diabetes.  4.  Gout.  5.  GERD. 6.  Hyperlipidemia.   PAST SURGICAL HISTORY: PD catheter placement and removal today, prior kidney transplant and then resection of the transplanted kidney, left arm dialysis.   ALLERGIES: No known drug allergies.   SOCIAL HISTORY: He is from [place]. He used to work as a Glass blower/designer. No tobacco, alcohol or drugs.   FAMILY HISTORY: Noncontributory.   REVIEW OF SYSTEMS: Eleven systems reviewed and negative except as per HPI, through the interpreter.   ANTIBIOTICS SINCE ADMISSION: Include vancomycin and Ceftazidime.  PHYSICAL EXAMINATION: VITAL SIGNS: Temperature 97.8, pulse 74, blood pressure 158/69, respirations 20, sat 98% on room air.  GENERAL: He is somewhat disheveled and chronically ill-appearing.  HEENT: Pupils reactive. Sclerae anicteric.  Oropharynx is clear.  NECK: Supple.  HEART: Regular.  LUNGS: Clear.  ABDOMEN: Distended slightly with mild diffuse tenderness to palpation.  EXTREMITIES: No clubbing, cyanosis or edema.  NEUROLOGIC: He has some asterixis, but otherwise grossly nonfocal neuro exam.   DIAGNOSTIC DATA: White blood count on March 8th was 13.3, currently down to 8.8 on March 12th.  Hemoglobin 8.4, platelets 202,000. The peritoneal fluid analysis March 8th at 862 nucleated cells, 8% neutrophils, 21% lymphocytes and 70% monocytes.   Blood cultures March 8th were negative x2. Dialysis fluid culture was negative. PCR for C. diff was positive, March 9th. Albumin is low at 2.4, otherwise LFTs are within normal limits. Creatinine on admission was 21.5, currently it is 10.6.   Imaging: CT of the abdomen and pelvis March 9th showed moderate sigmoid colon diverticulitis without definitive extraluminal gas or abscesses. There was interval resection of a transplant kidney, small amount of hematoma and prominence of fatty tissue along the former transplant kidney bed. There is mild atelectasis in the lower lobe. Faint interstitial accentuation in the right lower lobe, may reflect some localized alveolitis. There was mild arteriosclerosis and there was a disk bulge in the lumbar spine.   IMPRESSION: A 64 year old admitted with abdominal pain potentially due to peritonitis, although CT does also seem to show some diverticulitis. He has also been diagnosed with Clostridium difficile. His peritoneal dialysis catheter has been removed, March 14th. Clinically, he is improving although he still has some asterixis likely related to uremia.   RECOMMENDATIONS: 1.  Now that the catheter is out, I would recommend a 2 week course of IV antibiotics with the ceftazidime and vancomycin given at dialysis.  2.  For the Clostridium difficile, I would recommend continuing oral Flagyl for another 10 days following cessation of his IV antibiotics. The  Flagyl in combination with the other antibiotics would also cover diverticulitis.  3.  If he clinically worsens, I would suggest repeat CAT scanning to reimage his diverticulitis.   Thank you for the consult. I will be glad to follow with you.   ____________________________ Cheral Marker. [name], MD dpf:sb D: 08/30/2014 06:33:00 ET T: 08/30/2014 07:06:24 ET JOB#: [id number]  cc: Cheral Marker. [name], MD, <Dictator> Nekisha Mcdiarmid [name] MD ELECTRONICALLY SIGNED 09/08/2014 22:10

## 2014-10-16 NOTE — Op Note (Signed)
PATIENT NAME:  [name], [name] MR#:  [id number] DATE OF BIRTH:  May 12, 1951  DATE OF PROCEDURE:  08/29/2014  PREOPERATIVE DIAGNOSES:  1.  End-stage renal disease.  2.  Poorly functioning peritoneal dialysis catheter with desired transition back to hemodialysis.  3.  Possible peritonitis.  4.  Clostridium difficile colitis.   POSTOPERATIVE DIAGNOSES:  1.  End-stage renal disease.  2.  Poorly functioning peritoneal dialysis catheter with desired transition back to hemodialysis.  3.  Possible peritonitis.  4.  Clostridium difficile colitis.   PROCEDURES: Removal of peritoneal dialysis catheter.   SURGEON: Algernon Huxley, M.D.   ANESTHESIA:  TIVA.  ESTIMATED BLOOD LOSS: Minimal.   INDICATION FOR PROCEDURE: A 64 year old Asian male who has a peritoneal dialysis catheter that is not currently being used. He is switching back to hemodialysis, and desires to have his catheter out.  In addition, there is question about peritonitis from the catheter, so it should be removed. Risks and benefits were discussed. Informed consent was obtained.   DESCRIPTION OF PROCEDURE: The patient is brought to the operative suite. After an adequate level of intravenous sedation obtained, his abdomen was sterilely prepped and draped, and a sterile surgical field was created. I anesthetized the area around the catheter exit and then anesthetized the incision in the left lower abdomen where the catheter was placed. I then reopened this incision.  From there, I dissected out the cuff that was adhered to the abdominal fascia just above the peritoneum.  This was freed and the catheter was removed from the peritoneal cavity in its entirety. This opening was closed with a 3-0 Vicryl purse string suture. The superficial cuff was then dissected out from this incision without difficulty and removed from the surrounding connective tissue. The catheter was then removed in its entirety without difficulty with gentle traction. The wound was  then closed with a 3-0 Vicryl and a 4-0 Monocryl. Sterile dressings were placed. The patient was awakened from anesthesia and taken to the recovery room in stable condition, having tolerated the procedure well.   ____________________________ Algernon Huxley, MD jsd:sp D: 08/29/2014 15:57:31 ET T: 08/29/2014 16:08:27 ET JOB#: [id number]  cc: Algernon Huxley, MD, <Dictator> Algernon Huxley MD ELECTRONICALLY SIGNED 09/05/2014 15:01

## 2014-10-16 NOTE — Consult Note (Signed)
CHIEF COMPLAINT and HISTORY:  Subjective/Chief Complaint abdominal pain and fever   History of Present Illness Patient is a 64 yo male who has ESRD and had a kidney transplant for about ten years, but this was removed about 3-4 months ago and he is back on dialysis.  He tried to do PD, but this has not gone well and he wants to have this removed.  He was admitted 4 days ago with abdominal pain, fever and found to have peritonitis and c. difficile colitis.  He has an access in his left arm that is working well.  Abdominal pain better with treatment, but not resolved.  Has diarrhea which is also improved.  Nausea still present and appetite not good.   PAST MEDICAL/SURGICAL HISTORY:  Past Medical History:   Renal Failure:    Hyperlipidemia:    HTN:    Diabetes:    kidney transplant 2004:   ALLERGIES:  Allergies:  IVP Dye: Itching  HOME MEDICATIONS:  Home Medications: Medication Instructions Status  amLODIPine 10 mg oral tablet 1 tab(s) orally once a day Active  allopurinol 100 mg oral tablet 1 tab(s) orally once a day Active  metoprolol tartrate 50 mg oral tablet 1 tab(s) orally 2 times a day Active  Aspirin Enteric Coated 81 mg oral delayed release tablet 1 tab(s) orally once a day Active  calcium acetate 667 mg oral capsule 2 cap(s) orally 3 times a day (with meals) Active  ferrous sulfate 325 mg oral delayed release tablet 1 tab(s) orally once a day Active  furosemide 80 mg oral tablet 1 tab(s) orally once a day Active  simvastatin 10 mg oral tablet 1 tab(s) orally once a day (at bedtime) Active  pantoprazole 40 mg oral delayed release tablet 1 tab(s) orally once a day Active   Family and Social History:  Family History Hypertension  Stroke  parents deceased, had HTN and mother had stroke   Social History negative tobacco, negative ETOH, negative Illicit drugs   Place of Living Home   Review of Systems:  Subjective/Chief Complaint No TIA/stroke/seizure No heat or cold  intolerance No dysuria/hematuria.  Has ESRD No blurry or double vision No tinnitus or ear pain No rashes or ulcer No suicidal ideation or psychosis No signs of bleeding or easy bruising No SOB/DOE, orthopnea, or sputum No palpitations or chest pain Positive for nausea and abdominal pain and poor appetite No joint pain or joint swelling Positive for fever, no chills No unintentional weight loss or gain   Medications/Allergies Reviewed Medications/Allergies reviewed   Physical Exam:  GEN well developed, well nourished, no acute distress   HEENT pink conjunctivae, PERRL, hearing intact to voice   NECK No masses  trachea midline   RESP normal resp effort  clear BS  no use of accessory muscles   CARD regular rate  no thrills  no JVD   VASCULAR ACCESS AV fistula present  Good bruit  Good thrill  PD catheter present in left abdomen, no purulent drainage.  Mildly tender   ABD positive tenderness  normal BS   GU superpubic tenderness   LYMPH negative neck, negative axillae   EXTR negative cyanosis/clubbing, positive edema   SKIN normal to palpation, No rashes, No ulcers   NEURO cranial nerves intact, follows commands, motor/sensory function intact   PSYCH alert, A+O to time, place, person, good insight   LABS:  Laboratory Results: BF Analysis:    08-Mar-16 07:27, Amylase, Body Fluid  Body Fluid Source   PERITONEAL  FLUID   Result(s) reported on 23 Aug 2014 at 10:56AM.  Amylase (BF) 33  The reference range and other method performance specifications  have not been established for this body fluid. The test result  must be integrated into the clinical context for interpretation.    08-Mar-16 07:27, Body Fluid Nucleated Cell Count, Diff  Body Fluid Source (CC)   PERITONEAL FLUID  Color - (BF) YELLOW  Clarity (BF) CLEAR  NCC  (nucleated cell count) 862  Neutrophils  (BF) 8  Lymphocytes  (BF) 21  Monocytes/Macrophages  (BF) 70  Eosinophil  (BF) 1  Basophil  (BF) 0   Other Cells  (BF) 0  Hepatic:    08-Mar-16 06:27, Comprehensive Metabolic Panel  Bilirubin, Total 0.4  Alkaline Phosphatase 102  SGPT (ALT) 10  SGOT (AST) 13  Total Protein, Serum 9.2  Albumin, Serum 3.1    10-Mar-16 12:13, Renal Function Panel  Albumin, Serum 2.6  3.5-5.0  NOTE: New reference range   08/09/14  LabUnknown:    08-Mar-16 07:27, Body Fluid Nucleated Cell Count, Diff  Result Interpretation   Cytospin slide of peritoneal dialysate reviewed. There are  predominantly macrophages, with some neutrophils and lymphocytes.  No malignant cells are identified. Blain Pais, MD.  Routine Micro:    08-Mar-16 07:27, Body Fluid Culture w/Smear  Micro Text Report   BODY FLUID CULTURE    COMMENT                   NO GROWTH IN 3 DAYS    GRAM STAIN                FEW WHITE BLOOD CELLS    GRAM STAIN                NO ORGANISMS SEEN     ANTIBIOTIC  Specimen Source   dialysis draw  Culture Comment   NO GROWTH IN 3 DAYS  Gram Stain 1   FEW WHITE BLOOD CELLS  Gram Stain 2   NO ORGANISMS SEEN   Result(s) reported on 26 Aug 2014 at 08:54AM.    08-Mar-16 10:10, Blood Culture  Micro Text Report   BLOOD CULTURE    COMMENT                   NO GROWTH IN 48 HOURS     ANTIBIOTIC  Culture Comment   NO GROWTH IN 48 HOURS   Result(s) reported on 25 Aug 2014 at 10:00AM.  Micro Text Report   BLOOD CULTURE    COMMENT                   NO GROWTH IN 48 HOURS     ANTIBIOTIC  Culture Comment   NO GROWTH IN 48 HOURS   Result(s) reported on 25 Aug 2014 at 10:00AM.    09-Mar-16 17:29, Clostridium Difficile  Micro Text Report   CLOSTRIDIUM DIFFICILE    C.DIFFICILE ANTIGEN       C.DIFFICILE GDH ANTIGEN : POSITIVE    C.DIFFICILE TOXIN A/B     C.DIFFICILE TOXINS A AND B : NEGATIVE    PCR FOR TOXIGENIC C.DIFF  PCR FOR TOXIGENIC C.DIFFICILE : POSITIVE    INTERPRETATION Positive for toxigenic C. difficile, active toxin production NOT detected.  Patient has toxigenic C. difficile organisms present  in the bowel, but toxin was not detected.  The patient may be a carrier or the level of toxin in the sample was below the limit of  detection. This information should be used in conjunction with the patient's clinical history when deciding on possible therapy.     ANTIBIOTIC  Cardiology:    08-Mar-16 09:04, ED ECG  Ventricular Rate 63  Atrial Rate 63  P-R Interval 182  QRS Duration 94  QT 426  QTc 435  P Axis 36  R Axis 4  ECG interpretation   Normal sinus rhythm  Normal ECG  When compared with ECG of 21-Apr-2014 15:32,  No significant change was found  ----------unconfirmed----------  Confirmed by OVERREAD, NOT ([id number]), editor PEARSON, BARBARA ([id number]) on 08/25/2014 1:39:14 PM  ED ECG   Routine Chem:    08-Mar-16 06:27, Comprehensive Metabolic Panel  Glucose, Serum 101  BUN 77  Creatinine (comp) 21.51  Sodium, Serum 133  Potassium, Serum 5.9  Chloride, Serum 92  CO2, Serum 26  Calcium (Total), Serum 10.6  Osmolality (calc) 289  eGFR (African American) 3  eGFR (Non-African American) 2  eGFR values <45m/min/1.73 m2 may be an indication of chronic  kidney disease (CKD).  Calculated eGFR, using the MRDR Study equation, is useful in   patients with stable renal function.  The eGFR calculation will not be reliable in acutely ill patients  when serum creatinine is changing rapidly. It is not useful in  patients on dialysis. The eGFR calculation may not be applicable  to patients at the low and high extremes of body sizes, pregnant  women, and vegetarians.  Anion Gap 15    08-Mar-16 06:27, Lipase  Lipase 435  Result(s) reported on 23 Aug 2014 at 07:50AM.    08-Mar-16 07:27, Body Fluid Nucleated Cell Count, Diff  Result Comment   BODY FLUID - CORRECTED DIFFERENTIAL RESULTS CALLED   - TO KAvonON 08/23/14 @ 1612...qsd   Result(s) reported on 23 Aug 2014 at 04:16PM.    09-Mar-16 056:43 Basic Metabolic Panel (w/Total Calcium)  Glucose, Serum 114  65-99  NOTE: New Reference  Range   08/09/14  BUN 38  6-20  NOTE: New Reference Range   08/09/14  Creatinine (comp) 12.18  0.61-1.24  NOTE: New Reference Range   08/09/14  Sodium, Serum 138  135-145  NOTE: New Reference Range   08/09/14  Potassium, Serum 5.3  3.5-5.1  NOTE: New Reference Range   08/09/14  Chloride, Serum 95  101-111  NOTE: New Reference Range   08/09/14  CO2, Serum 31  22-32  NOTE: New Reference Range   08/09/14  Calcium (Total), Serum 8.8  8.9-10.3  NOTE: New Reference Range   08/09/14  Anion Gap 12  eGFR (African American) 5  eGFR (Non-African American) 4  eGFR values <682mmin/1.73 m2 may be an indication of chronic  kidney disease (CKD).  Calculated eGFR is useful in patients with stable renal function.  The eGFR calculation will not be reliable in acutely ill patients  when serum creatinine is changing rapidly. It is not useful in  patients on dialysis. The eGFR calculation may not be applicable  to patients at the low and high extremes of body sizes, pregnant  women, and vegetarians.    09-Mar-16 17:29, Clostridium Difficile  Result Comment   CDIFF - NOTIFIED OF CRITICAL VALUE   - READ-BACK PROCESS PERFORMED.   - SPOKE WITH JACLYN LANGDON   - 08/24/14 @ [place]--[facility]   Result(s) reported on 25 Aug 2014 at 09:36AM.    09-Mar-16 19:51, Potassium, Serum  Potassium, Serum 5.0  3.5-5.1  NOTE: New Reference Range   08/09/14  10-Mar-16 06:03, Iron and IBC ([facility])  Iron Binding Capacity (TIBC) 171  Unbound Iron Binding Capacity 151.5  Iron, Serum 19  45-182  NOTE: New Reference Range   08/09/14  Iron Saturation 11  Result(s) reported on 26 Aug 2014 at 10:01PM.    10-Mar-16 06:03, Potassium, Serum  Potassium, Serum 5.0  3.5-5.1  NOTE: New Reference Range   08/09/14    10-Mar-16 12:13, Renal Function Panel  Glucose, Serum 131  65-99  NOTE: New Reference Range   08/09/14  BUN 54  6-20  NOTE: New Reference Range   08/09/14  Creatinine (comp) 14.42   0.61-1.24  NOTE: New Reference Range   08/09/14  Sodium, Serum 135  135-145  NOTE: New Reference Range   08/09/14  Potassium, Serum 5.0  3.5-5.1  NOTE: New Reference Range   08/09/14  Chloride, Serum 94  101-111  NOTE: New Reference Range   08/09/14  CO2, Serum 28  22-32  NOTE: New Reference Range   08/09/14  Calcium (Total), Serum 8.7  8.9-10.3  NOTE: New Reference Range   08/09/14  Phosphorus, Serum 5.4  2.5-4.6  NOTE: New Reference Range   08/09/14  Anion Gap 13  eGFR (African American) 4  eGFR (Non-African American) 3  eGFR values <70m/min/1.73 m2 may be an indication of chronic  kidney disease (CKD).  Calculated eGFR is useful in patients with stable renal function.  The eGFR calculation will not be reliable in acutely ill patients  when serum creatinine is changing rapidly. It is not useful in  patients on dialysis. The eGFR calculation may not be applicable  to patients at the low and high extremes of body sizes, pregnant  women, and vegetarians.  Routine Hem:    08-Mar-16 06:27, Hemogram, Platelet Count  WBC (CBC) 13.3  RBC (CBC) 4.73  Hemoglobin (CBC) 10.3  Hematocrit (CBC) 34.3  Platelet Count (CBC) 252  Result(s) reported on 23 Aug 2014 at 07:42AM.  MCV 73  MCH 21.9  MCHC 30.1  RDW 21.4    09-Mar-16 07:18, CBC Profile  WBC (CBC) 10.2  RBC (CBC) 4.02  Hemoglobin (CBC) 8.9  Hematocrit (CBC) 29.0  Platelet Count (CBC) 222  MCV 72  MCH 22.0  MCHC 30.6  RDW 20.4  Neutrophil % 55.7  Lymphocyte % 27.4  Monocyte % 10.2  Eosinophil % 6.4  Basophil % 0.3  Neutrophil # 5.7  Lymphocyte # 2.8  Monocyte # 1.0  Eosinophil # 0.7  Basophil # 0.0  Result(s) reported on 24 Aug 2014 at 08:26AM.    10-Mar-16 06:03, Hemoglobin  Hemoglobin (CBC) 8.5  Result(s) reported on 25 Aug 2014 at 06:32AM.    10-Mar-16 12:13, CBC Profile  WBC (CBC) 10.0  RBC (CBC) 3.73  Hemoglobin (CBC) 8.3  Hematocrit (CBC) 26.6  Platelet Count (CBC) 208  MCV 71  MCH 22.2   MCHC 31.1  RDW 20.3  Neutrophil % 51.0  Lymphocyte % 28.9  Monocyte % 10.0  Eosinophil % 9.8  Basophil % 0.3  Neutrophil # 5.1  Lymphocyte # 2.9  Monocyte # 1.0  Eosinophil # 1.0  Basophil # 0.0  Result(s) reported on 25 Aug 2014 at 01:39PM.    12-Mar-16 05:27, Hemoglobin  Hemoglobin (CBC) 8.3  Result(s) reported on 27 Aug 2014 at 06:33AM.   RADIOLOGY:  Radiology Results: XRay:    14-Sep-15 14:04, Chest PA and Lateral  Chest PA and Lateral  REASON FOR EXAM:    diabetes  COMMENTS:  PROCEDURE: DXR - DXR CHEST PA (OR AP) AND LATERAL  - Feb 28 2014  2:04PM     CLINICAL DATA:  Diabetes.    EXAM:  CHEST  2 VIEW    COMPARISON:  08/06/2013.    FINDINGS:  Cardiac silhouette is normal in size. No mediastinal or hilar  masses. No evidence of adenopathy. Clear lungs. No pleural effusion  or pneumothorax.    Bony thorax is intact.     IMPRESSION:  No active cardiopulmonary disease.      Electronically Signed    By: Lajean Manes M.D.    On:02/28/2014 15:08         Verified By: Lasandra Beech, M.D.,    08-Oct-15 22:15, Chest Portable Single View  Chest Portable Single View  REASON FOR EXAM:    vomiting, fever, immunosuppresed  COMMENTS:       PROCEDURE: DXR - DXR PORTABLE CHEST SINGLE VIEW  - Mar 24 2014 10:15PM     CLINICAL DATA:  Biloma teen and fever beginning after dialysis  today. Initial encounter.    EXAM:  PORTABLE CHEST - 1 VIEW    COMPARISON:  Two-view chest 02/28/2014    FINDINGS:  The heart is enlarged. There is mild pulmonary vascular congestion.  No focal airspace disease is evident. A left upper extremity graft  is noted.     IMPRESSION:  1. Cardiomegaly and mild pulmonary vascular congestion.      Electronically Signed    By: Lawrence Santiago M.D.    On: 03/24/2014 22:40         Verified By: Resa Miner. MATTERN, M.D.,    09-Oct-15 00:20, Abdomen Flat and Erect  Abdomen Flat and Erect  REASON FOR EXAM:    abdominal pain,  vomiting  COMMENTS:       PROCEDURE: DXR - DXR ABDOMEN 2 V FLAT AND ERECT  - Mar 25 2014 12:20AM     CLINICAL DATA:  Nausea and vomiting after dialysis today.    EXAM:  ABDOMEN - 2 VIEW    COMPARISON:  One-view chest x-ray 03/24/2014.    FINDINGS:  Heart is enlarged. Mild pulmonary vascular congestion is again  noted.  Supine and upright views of the abdomen demonstrating nonspecific  bowel gas pattern. A peritoneal dialysis catheter is in place.  Vascular calcifications are evident. Vascular grafts are noted  within the left upper extremity.     IMPRESSION:  1. Peritoneal dialysis catheter is in place.  2. Nonspecific bowel gas pattern without evidence for obstruction or  free air.      Electronically Signed    By: Lawrence Santiago M.D.    On: 03/25/2014 00:51     Verified By: Resa Miner. MATTERN, M.D.,    [phone] 16:28, Chest Portable Single View  Chest Portable Single View  REASON FOR EXAM:    lethargy, fever, after dialysis  COMMENTS:       PROCEDURE: DXR - DXR PORTABLE CHEST SINGLE VIEW  - Apr 21 2014  4:28PM     CLINICAL DATA:  Acute onset of fever, shaking chills and abdominal  pain after completion of dialysis earlier today. Current history of  hypertension and diabetes.    EXAM:  PORTABLE CHEST - 1 VIEW    COMPARISON:  Portable chest x-ray 03/24/2014. Two-view chest x-ray  02/28/2014, 08/06/2013.  FINDINGS:  Suboptimal inspiration accounts for crowded bronchovascular  markings, especially in the bases, and accentuates the cardiac  silhouette. Taking this into  account, cardiac silhouette moderately  enlarged but stable. Lungs clear. Bronchovascular markings normal.  Pulmonary vascularity normal. No visible pleural effusions. No  pneumothorax. Left upper arm venous stent noted.     IMPRESSION:  Suboptimal inspiration. Stable cardiomegaly. No acute  cardiopulmonary disease.      Electronically Signed    By: Evangeline Dakin M.D.    On: 04/21/2014  16:34         Verified By: Deniece Portela, M.D.,  LabUnknown:    12-May-15 16:30, CT Abdomen and Pelvis Without Contrast  PACS Image    14-Sep-15 14:04, Chest PA and Lateral  PACS Image    08-Oct-15 22:15, Chest Portable Single View  PACS Image    09-Oct-15 00:20, Abdomen Flat and Erect  PACS Image    09-Oct-15 02:08, CT Abdomen and Pelvis Without Contrast  PACS Image    05-Nov-15 16:28, Chest Portable Single View  PACS Image    [phone] 19:54, CT Abdomen and Pelvis Without Contrast  PACS Image    08-Mar-16 07:27, Body Fluid Nucleated Cell Count, Diff  Result Interpretation  Cytospin slide of peritoneal dialysate reviewed. There are  predominantly macrophages, with some neutrophils and lymphocytes.  No malignant cells are identified. Blain Pais, MD.    09-Mar-16 18:04, CT Abdomen and Pelvis Without Contrast  PACS Image  CT:    12-May-15 16:30, CT Abdomen and Pelvis Without Contrast  CT Abdomen and Pelvis Without Contrast  REASON FOR EXAM:    (1) llq pain ele lipase hi creat; (2) abd pain  COMMENTS:       PROCEDURE: CT  - CT ABDOMEN AND PELVIS W0  - Oct 26 2013  4:30PM     CLINICAL DATA:  Nausea, vomiting and diarrhea since eating oysters  last night. Left lower quadrant pain. Elevated lipase.    EXAM:  CT ABDOMEN AND PELVIS WITHOUT CONTRAST    TECHNIQUE:  Multidetector CT imaging of the abdomen and pelvis was performed  following the standard protocol without IV contrast.  COMPARISON:  08/06/2013 ultrasound.  No comparison CT.    FINDINGS:  Majority of the colon and portions of small bowel with wall  thickening which may be related to under distension although  enterocolitis not excluded in proper clinical setting. There is no  evidence of extra luminal bowel inflammatory process, free fluid or  free air. Appendix does not appear inflamed. Scattered diverticula  without focal inflammation identified.    Atrophic kidneys with low-density structures larger ones  which are  cysts, others too small to characterize. Nonobstructing bilateral  renal calculi of the atrophic native kidneys. Transplant kidney  right lower quadrant of the abdomen with tiny nonobstructing renal  calculi and 9 mm cyst.  Cardiomegaly.  Coronary artery calcifications.    Small hiatal hernia.    Atherosclerotic type changes of the abdominal aorta and iliac  arteries. Ring-like calcification adjacent to the right pelvic renal  transplant may be related to atherosclerotic type changes.  Anastomotic aneurysm would be difficult to exclude but cannot be  assessed secondary to lack of contrast.    Dilated gallbladder without CT evidence of inflammation. If this is  of concern, ultrasound may be considered for further delineation. No  calcified gallstones.    Taking into account limitation by non contrast imaging, no worrisome  hepatic, splenic, adrenal or pancreatic lesion.    Sclerotic bones suggestive of renal osteodystrophy.    Mild scarring lung bases.     IMPRESSION:  Majority of the colon  and portions of small bowel with wall  thickening which may be related to under distension although  enterocolitis not excluded in proper clinical setting. There is no  evidence of extra luminal bowel inflammatory process, free fluid or  free air. Appendix does not appear inflamed. Scattered diverticula  without focal inflammation identified.    Dilated gallbladder without CT evidence of inflammation. If this is  of concern, ultrasound may be considered for further delineation. No  calcified gallstones.    Atrophic native kidneys contain nonobstructing calculi and probable  cysts. Transplant kidney right lower quadrant of the abdomen with  tiny nonobstructing renal calculi and 9 mm cyst.    Cardiomegaly.  Coronary artery calcifications.    Atherosclerotic type changes of the abdominal aorta and iliac  arteries. Ring-like calcification adjacent to the right pelvic  renal  transplant may be related to atherosclerotic type changes.  Anastomotic aneurysm would be difficult to exclude but cannot be  assessed secondary to lack of contrast.  Sclerotic bones suggestive of changes of renal osteodystrophy.      Electronically Signed    By: Chauncey Cruel M.D.    On: 10/26/2013 16:55         Verified By: Doug Sou, M.D.,    09-Oct-15 02:08, CT Abdomen and Pelvis Without Contrast  CT Abdomen and Pelvis Without Contrast  REASON FOR EXAM:    (1) intractable vomiting, low grade fever, newly   inserted peritoneal dialysis ca  COMMENTS:       PROCEDURE: CT  - CT ABDOMEN AND PELVIS W0  - Mar 25 2014  2:08AM     CLINICAL DATA:  Nausea and vomiting after dialysis today. Initial  encounter.    EXAM:  CT ABDOMEN AND PELVIS WITHOUT CONTRAST    TECHNIQUE:  Multidetector CT imaging of the abdomen and pelvis was performed  following the standard protocol without IV contrast.  COMPARISON:  Acute abdominal series from the same day.    FINDINGS:  Minimal left basilar atelectasis is present. The heart is enlarged.  Coronary artery calcifications are present. There is no significant  pleural or pericardial effusion.    The liver is within normal limits. Granulomatous changes are present  in the spleen. A small hiatal hernia is present. The remainder the  stomach is unremarkable. The duodenum and pancreas are within normal  limits. The common bile duct is normal. The gallbladder is  distended. A 1.7 cm benign appearing cystis present at the upper  pole of the right kidney. The kidneys are atrophic bilaterally.  Smaller cysts are present bilaterally. The ureters are within normal  limits. The urinary bladder is unremarkable. Moderate inflammatory  changes surround a transplant kidney in the right lower quadrant.  Three punctate nonobstructing stones are present in the transplant  kidney.    Diverticular changes are present throughout the sigmoid colon.  There  is no focal inflammation to see suggest diverticulitis. Much of  transverse colon is collapsed. Diverticular changes are noted in the  transverse and ascending colon as well. Small bowel is within normal  limits. No significant adenopathy or free fluid is present. The  peroneal dialysis catheter is in place. Bone windows are  unremarkable.     IMPRESSION:  1. Diffuse inflammatory changes within the transplant kidney in the  right lower quadrant. This is concerning for transplant rejection.  2. The native kidneys are atrophic bilaterally with a 1.7 cm a  benign appearing cyst at the upper pole of the  right kidney.  3. Mild cardiomegaly and coronary artery disease.  4. Diffuse diverticular changes throughout the colon without focal  inflammation to suggest diverticulitis.  5. Peritoneal dialysis catheter.      Electronically Signed    By: Lawrence Santiago M.D.    On: 03/25/2014 02:21         Verified By: Resa Miner. MATTERN, M.D.,    [phone] 19:54, CT Abdomen and Pelvis Without Contrast  CT Abdomen and Pelvis Without Contrast  REASON FOR EXAM:    (1) rlq pain, tender, fever, h/o pyelo of TP kidney;   (2) rlq pain, tender, fever  COMMENTS:       PROCEDURE: CT  - CT ABDOMEN AND PELVIS W0  - Apr 21 2014  7:54PM     CLINICAL DATA:  Fever.  Right lower quadrant pain and tenderness.    EXAM:  CT ABDOMEN AND PELVIS WITHOUT CONTRAST    TECHNIQUE:  Multidetector CT imaging of the abdomen and pelvis was performed  following the standard protocol without IV contrast.  COMPARISON:  CT scans dated 03/25/2014 and 10/26/2013    FINDINGS:  There is new slight dependent atelectasis in both lung bases.    The transplant kidney is more edematous than on the prior study.  There is persistent soft tissue stranding in the perinephric fat.  There is no obstruction. There is a tiny calcification in the upper  pole of the kidney which is unchanged. The adjacent appendix  is  normal.    Liver, biliary tree, pancreas, spleen, and adrenal glands are  normal. Atrophic native kidneys with small cysts and extensive  calcifications appear stable. There are numerous diverticula  throughout the colon but there is no diverticulitis. Peritoneal  dialysis catheter is noted with the tip in the right lower quadrant.  Bladder is normal. No acute osseous abnormality.     IMPRESSION:  The transplantkidney is more edematous and enlarged as compared to  the prior CT scan dated 03/25/2014 with per cyst and soft tissue  stranding in the perinephric fat around the transplant. This  suggests either rejection or infection.    No other acute abnormality.  Appendix is normal.    Numerous diverticula in the colon including the cecum.      Electronically Signed    By: Rozetta Nunnery M.D.    On: 04/21/2014 20:32         Verified By: Larey Seat, M.D.,    09-Mar-16 18:04, CT Abdomen and Pelvis Without Contrast  CT Abdomen and Pelvis Without Contrast  REASON FOR EXAM:    (1) abdominal pain, peritoneal dialysis with possible   peritonitis, elevated lipa  COMMENTS:       PROCEDURE: CT  - CT ABDOMEN AND PELVIS W0  - Aug 24 2014  6:04PM     CLINICAL DATA:  Nausea and vomiting/abdominal pain. Possible  peritonitis.    EXAM:  CT ABDOMEN AND PELVIS WITHOUT CONTRAST    TECHNIQUE:  Multidetector CT imaging of the abdomen and pelvis was performed  following the standard protocol without IV contrast.  COMPARISON:  04/21/2014    FINDINGS:  Lower chest: Mild atelectasis in both lower lobes with some  localized interstitial accentuation in the right lower lobe medially  on image 5 of series 5. Coronary artery atherosclerotic  calcification. No pleural effusion.    Hepatobiliary: Unremarkable    Pancreas: Unremarkable    Spleen: Unremarkable    Adrenals/Urinary Tract: Severely atrophic native kidneys. The  right  lower quadrant transplant kidney is been removed.  Urinary bladder  empty.    Stomach/Bowel: Small periampullary duodenal diverticulum without  inflammatory findings. Scattered colonic diverticula. Appendix  unremarkable. There is new abnormal wall thickening in the proximal  sigmoid colon with surrounding mesenteric edema any segment of bowel  which was not previously thickened on 04/21/2014. Appearance on  image 66 series 3 favors moderate diverticulitis. No discrete  abscess or extraluminal gas.    Vascular/Lymphatic: Mild aortoiliac atherosclerotic calcification.    Reproductive: Unremarkable  Other: Postoperative findings related to removal of the right pelvic  transplant kidney. Right lower quadrant peritoneal dialysis catheter  noted without abnormal surrounding fluid collection.    Musculoskeletal: Disc bulges noted at L3-4 and L4-5.     IMPRESSION:  1. Moderate sigmoid colon diverticulitis without definite  extraluminal gas or abscess.  2. Interval resection of the transplant kidney. The native kidneys  remain severely atrophic. Small amount of hematoma and prominence of  fatty tissues along the former transplant kidney bed.  3. Mild atelectasis in the lower lobes. Faint interstitial  accentuation in the right lower lobe may reflect some localized  alveolitis.  4. Mild atherosclerosis.  5. Disc bulges at L3-4 and L4-5.      Electronically Signed    By: Van Clines M.D.    On: 08/24/2014 18:18         Verified By: Carron Curie, M.D.,   ASSESSMENT AND PLAN:  Assessment/Admission Diagnosis peritonitis and c. diff colitis ESRD, has not done well with PD HTN failed kidney transplant, removed last fall functional access left arm working well   Plan Given peritonitis and failure of PD, would agree with removal or PD catheter.  Will plan to go ahead and remove this in the OR Monday if OR time available.  Have discussed risks and benefits with patient and he is agreeable to proceed.   C. Diff, getting  appropriate treatment.   ESRD and HTN managed by primary service.   After PD catheter removed, will plan ongoing HD and his access seems to be working well.  will plan outpatient follow up to monitor this going forward    level 4 consult   Electronic Signatures: Algernon Huxley (MD)  (Signed 12-Mar-16 12:14)  Authored: Chief Complaint and History, PAST MEDICAL/SURGICAL HISTORY, ALLERGIES, HOME MEDICATIONS, Family and Social History, Review of Systems, Physical Exam, LABS, RADIOLOGY, Assessment and Plan   Last Updated: 12-Mar-16 12:14 by Algernon Huxley (MD)

## 2014-10-16 NOTE — Consult Note (Signed)
PATIENT NAME:  [name], JUCKETT MR#:  [id number] DATE OF BIRTH:  10/13/1950  DATE OF CONSULTATION:  09/29/2014  REFERRING PHYSICIAN:   CONSULTING PHYSICIAN:  Dionisio David, MD  HISTORY OF PRESENT ILLNESS: This is a 64 year old Asian male with past medical history of renal failure, hypertension and hyperlipidemia who presented to the Emergency Room with chest pain. Chest pain was pressure-type associated with shortness of breath and diaphoresis. He also is seen by Dr. Candiss Norse and Dr. Holley Raring for dialysis. He is still having intermittent chest pain. He underwent stress Myoview yesterday which showed lateral and inferior wall ischemia with ejection fraction 55%.   PAST MEDICAL HISTORY: As mentioned, end-stage renal disease on dialysis, hypertension, hyperlipidemia, and diabetes.   SOCIAL HISTORY: Denies EtOH abuse or smoking.   FAMILY HISTORY: Positive for coronary artery disease.   PHYSICAL EXAMINATION: GENERAL: He is alert, oriented x3, in no acute distress.  VITAL SIGNS: Stable.  NECK: No JVD.  LUNGS: Clear.  HEART: Regular rate and rhythm. Normal S1, S2. No audible murmur.  ABDOMEN: Soft, nontender. Positive bowel sounds.  EXTREMITIES: No pedal edema.  NEUROLOGIC: The patient appears to be intact.   DIAGNOSTIC DATA: EKG shows sinus rhythm. No acute changes.   ASSESSMENT AND PLAN: The patient has chest pain with the diagnosis of unstable angina, abnormal stress test, normal ejection fraction. Advise cardiac catheterization, which will be done today.   Thank you very much for the referral.   ____________________________ Dionisio David, MD sak:sb D: 09/29/2014 09:36:09 ET T: 09/29/2014 10:08:50 ET JOB#: [id number]  cc: Dionisio David, MD, <Dictator> Dionisio David MD ELECTRONICALLY SIGNED 09/30/2014 15:28

## 2014-10-16 NOTE — Consult Note (Signed)
PATIENT NAMEESVIN, [name] MR#:  [id number] DATE OF BIRTH:  23-Oct-1950  DATE OF CONSULTATION:  09/28/2014.  CONSULTING SERVICE:  Gastroenterology.    CONSULTING PHYSICIAN:  Dr. [name].    REASON FOR CONSULTATION:  Nausea, vomiting, and diarrhea.   HISTORY OF PRESENT ILLNESS:  This patient is a 64 year old gentleman who had just come back from a stress test when I came to the room.  The patient does not speak [place] and had a Optometrist in the room.  The patient stated he was very sleepy and did not want to talk.  I spoke to the interpreter who explained to him that she had to leave and if he was going to have me evaluate him, he would have to talk.  He continued to refuse despite this.  I spoke to his wife through the interpreter and found out the patient has been having nausea, vomiting, and diarrhea for the last two months.  The patient's wife states that she believes it started after he had peritoneal hemodialysis stopped and hemodialysis started.  She states that he does not have any diarrhea unless he eats.  There has been no weight loss, and they deny him ever having these symptoms of nausea and vomiting in the past.  With review of the chart prior to seeing the patient, it turns out that the patient did have similar episodes back in May of last year, approximately 11 months ago, and when confronted with this, the wife states that was nausea and vomiting but it is worse now than it was before and it did not have diarrhea.  The patient's wife states that there was no report of any blood or black stools with his diarrhea and that it does not wake him from his sleep.  The diarrhea only happens when he has food intake.  Again, the patient's wife reports that his clothes fit the same as they did before and he has not lost any weight despite all the nausea and vomiting she states he is having.  The patient does not follow with a primary care doctor and states that he only follows up with his  nephrologist whenever he has a problem.   PAST MEDICAL HISTORY:  Clostridium difficile colitis, diverticulitis, diabetes, hypertension, end-stage renal disease.   PAST SURGICAL HISTORY:  Peritoneal dialysis AV dialysis fistula graft.   SOCIAL HISTORY:  No tobacco, alcohol, or drugs.   FAMILY HISTORY:  Noncontributory.   HOME MEDICATIONS:  Simvastatin, amlodipine, pantoprazole, allopurinol, metoprolol, aspirin, calcium acetate, Zofran.   REVIEW OF SYSTEMS:  A 10-point review of systems was unable to be obtained because the patient did not want to answer questions.   PHYSICAL EXAMINATION:  GENERAL:  Patient lying in bed sleeping in no apparent distress.  VITAL SIGNS: Temperature 98.3, pulse 69, respirations 18, blood pressure 183/78, pulse oximetry 91%.  HEENT:  Normocephalic, atraumatic, without JVD, without lymphadenopathy.  LUNGS:  Clear to auscultation bilaterally.  HEART:  Regular rate and rhythm without murmurs, rubs, or gallops.  ABDOMEN:  Soft with mild lower abdominal pain/tenderness, without rebound, without guarding.  EXTREMITIES:  Without cyanosis, clubbing, or edema.  NEUROLOGICAL:  Exam limited because of the patient's cooperation.  PSYCHIATRIC:  The patient does not appear to have psychiatric deficits.   LABORATORY DATA:  The patient's troponin 0.06, hemoglobin 7.8.   ASSESSMENT AND PLAN:  This patient is a 64 year old gentleman who came in with chest pain who has two months of what is reported to be nausea,  vomiting, and diarrhea.  The diarrhea only comes when he eats and the nausea and vomiting is also after he eats.  The patient has had a colonoscopy in the past.  His wife states that it was done prior to being put on the kidney transplant list but states that nothing was found at that time and that was a few years ago.  At this time, the patient has had diarrhea, nausea, and vomiting for two months without any weight loss and is being worked up for chest pain.  The patient  should have a gastrointestinal workup for these symptoms but only after his cardiac issues have resolved and he has been cleared from a cardiology point of view.  At that time, the patient then should follow up as an outpatient for an upper endoscopy and possible colonoscopy to find out why he is having the nausea, vomiting, and diarrhea.  No need to do any of these procedures while he is in-house especially in his acute state with recent chest pain.   Thank you very much for involving me in the care of this patient.  If you have any questions, please do not hesitate to call.     ____________________________ [name], MD dw:kc D: 09/28/2014 14:05:25 ET T: 09/28/2014 14:16:29 ET JOB#: [id number]  cc: [name], MD, <Dictator> [name] MD ELECTRONICALLY SIGNED 09/29/2014 5:11

## 2014-10-16 NOTE — Discharge Summary (Signed)
Dates of Admission and Diagnosis:  Date of Admission 27-Sep-2014   Date of Discharge 29-Sep-2014   Admitting Diagnosis chest pain   Final Diagnosis 1. coronary artery disease s/p catheterization and multivessel disease 2. Chronic diarrhea 3. End Stage Renal disease on Hemodialysis 4. Malnutrition 5. hypertension, uncontrolled    Chief Complaint/History of Present Illness CHIEF COMPLAINT: Chest pain.   HISTORY OF PRESENT ILLNESS: This is a 64 year old male with history of ESRD on hemodialysis, hypertension, hyperlipidemia, diet controlled diabetes and gout who comes in because of midsternal chest pain. The patient woke up with chest pain. It is in the mid sternal region, around 5 out of 10 in severity and it was radiating to the back. The patient has no radiation to the arm or left shoulder. No radiation to the neck. Has no cough. No fever. Has shortness of breath and dizziness associated with chest pain. Pain goes into the back and upper back and movement makes it worse. The patient's blood pressure was elevated on arrival. The patient initial blood pressure 177/82, and the patient had chest pain going into the upper back so ER doctor was concerned about possible aortic dissection, but the patient is hemodynamically stable and repeat blood pressure is 149/60. The patient cannot get CT chest because he is allergic to IV dye, so MRA of the chest has been ordered by ER physician. The patient???s troponin was elevated slightly at 0.06. Concerning this and chest pain, he is going to be admitted for chest pain rule out.   Allergies:  IVP Dye: Itching  Cardiac Catherization:  14-Apr-16 11:02   Cardiac Catheterization  [facility] [place], [place] [address] [phone]   Cardiovascular Catheterization Comprehensive Report   Patient: [name] Study date: 09/29/2014 MR number: [id number] Account number: [id number]   DOB: 1950/06/20 Age: 29 years Gender: Male Race:  Oriental Height: 59.8 in Weight: 134.6 lb   Diagnostic Cardiologist:  Neoma Laming, MD   SUMMARY:   -CARDIAC STRUCTURES: Global left ventricular function was normal. EF calculated by contrast ventriculography was 60 %.   -Summary: Proximal LAD lesion, 50-60 % along with distal left main disease, and normal EF. Advise initially medical therapy with statins/imdur/asp/plavix and if it fails then CABG.   CORONARY CIRCULATION: The coronary circulationis right dominant. Mid left main: There was a 40 % stenosis. Proximal LAD: There was a 60 % stenosis. Mid circumflex: There was a 40 % stenosis. Mid RCA: Angiography showed minor luminal irregularities.   VENTRICLES: Global left ventricular function was normal. EF calculated by contrast ventriculography was 60 %.   INDICATIONS: Angina/MI: myocardial infarction without ST elevation (NSTEMI), CCS class IV.   CLINICAL PRESENTATION: NYHA class IV congestive heart failure was recently diagnosed.   HISTORY: No history of previous myocardial infarction. There was no prior diagnosis of congestive heart failure. The patient has hypertension and dialysis-treated renal failure. There was no history of cardiogenic shock, cerebrovascular disease, peripheral arterial disease, chronic lung disease, cardiomyopathy, or cardiac arrest. There was no family history of coronary artery disease. PRIOR CARDIOVASCULAR PROCEDURES: No history of valve surgery, coronary or graft percutaneous intervention, or coronary bypass surgery.   PRIOR DIAGNOSTIC TEST RESULTS: No prior stress test is available.   PROCEDURES PERFORMED: Left heart catheterization with ventriculography. Procedure: Successful Closure with Mynx   COMPLICATIONS: No complication occurred during the cath lab visit.   PROCEDURE: The risks and alternatives of the procedures and conscious sedation were explained to the patient and informed consent was obtained.  The patient was brought to the  cath lab and placed on the table. The planned puncture sites were prepped and draped in the usual sterile fashion.   -Right femoral artery access. The vessel was accessed, a wire was threaded into the vessel, and a was advanced over the wire into the vessel.   -Left heart catheterization. A catheter was advanced to the ascending aorta. Ventriculography was performed using power injection of contrast agent.   -Successful Closure with Mynx.   PROCEDURE COMPLETION: There were no complications. TIMING: Test started at 11:15. Test concluded at 11:38. RADIATION EXPOSURE: Fluoroscopy time: 2.33 min. Fluoroscopy dose: 1.082 Gray. MEDICATIONS GIVEN: Midazolam, 1 mg, IV, at 11:09. Fentanyl, 50 mcg, IV, at 11:09. Hydralazine (Apresoline), 20 mg, IV, at 11:38. CONTRAST GIVEN: Isovue 150 ml.   Prepared and signed by   Neoma Laming, MD Signed 09/29/2014 11:41:15   STUDY DIAGRAM   Angiographic findings Native coronary lesions:  Mid left main: Lesion 1: 40 % stenosis.  Proximal LAD: Lesion 1: 60 % stenosis.  Mid circumflex: Lesion 1: 40 % stenosis.   HEMODYNAMIC TABLES   Pressures:  Baseline Pressures:  - HR: 63 Pressures:  - Rhythm: Pressures:  -- Aortic Pressure (S/D/M): 172/68/79 Pressures:  -- Left Ventricle (s/edp): 153/30/--   Outputs:  Baseline Outputs:  -- CALCULATIONS[age] Outputs:  -- CALCULATIONS: Body Surface Area: 1.58 Outputs:  -- CALCULATIONS: Height in cm: 152.00 Outputs:  -- CALCULATIONS: Sex: Male Outputs:  -- CALCULATIONS: Weight in kg: 61.20  Routine Chem:  14-Apr-16 13:16   Phosphorus, Serum  1.6 (2.5-4.6 NOTE: New Reference Range  08/23/14)   PERTINENT RADIOLOGY STUDIES: LabUnknown:    13-Apr-16 11:50, NM MYOCARDIAL SCAN  PACS Image    Pertinent Past History:  Pertinent Past History PAST MEDICAL HISTORY: Significant for admission in March, from March 8th to 15th for peritonitis due to peritoneal dialysis catheter, C. diff infection,  and acute diverticulitis. The patient received home antibiotics with vancomycin and ceftazidime with dialysis and also p.o. Flagyl until March 30th for his C. diff colitis. Other past medical problems include diabetes mellitus type II, not on medications at home, hypertension, ESRD on hemodialysis on Tuesday, Thursday, and Saturday, and last dialysis was on Saturday.   Hospital Course:  Hospital Course 32 m with ESRD on HD, hypertension, chronic nausea here with CP  * CP - Multi vessel disease on cath. Transfer to [facility] for high risk cath Discussed with Dr. Humphrey Rolls  * ESRD - ON HD  * Essential hypertension Add hydralazine  * DM, type 2, without complications,  continue SSI  * Weakness  * Vomtiing, weight loss Appreciate GI help. Needs OP f/u with Dr. Allen Norris and may be EGD  time spent on discharge 40 min   Condition on Discharge Fair   Code Status:  Code Status Full Code   PHYSICAL EXAM ON DISCHARGE:  Physical Exam:  GEN no acute distress   NECK supple  No masses   RESP normal resp effort  clear BS   CARD regular rate  no murmur   ABD denies tenderness  soft   DISCHARGE INSTRUCTIONS HOME MEDS:  Medication Reconciliation: Patient's Home Medications at Discharge:     Medication Instructions  simvastatin 10 mg oral tablet  1 tab(s) orally once a day (at bedtime)   pantoprazole 40 mg oral delayed release tablet  1 tab(s) orally once a day   allopurinol 100 mg oral tablet  1 tab(s) orally once a day  metoprolol tartrate 50 mg oral tablet  1 tab(s) orally 2 times a day   aspirin enteric coated 81 mg oral delayed release tablet  1 tab(s) orally once a day   calcium acetate 667 mg oral capsule  2 cap(s) orally 3 times a day (with meals)   hydralazine 50 mg oral tablet  1 tab(s) orally 3 times a day   metoclopramide 5 mg oral tablet  1 tab(s) orally 3 times a day (before meals)     Physician's Instructions:  Home Health? Yes   Home Health Service Physicial Therapy    Diet Renal Diet   Activity Limitations As tolerated   Return to Work Not Applicable   Time frame for Follow Up Appointment 2-4 weeks  Dr. Allen Norris   Time frame for Follow Up Appointment 1-2 weeks  primary care provider   Time frame for Follow Up Appointment 1-2 weeks  Dr. Humphrey Rolls - cardiology   Electronic Signatures: Darvin Neighbours, Lottie Dawson (MD)  (Signed 14-Apr-16 15:02)  Authored: ADMISSION DATE AND DIAGNOSIS, CHIEF COMPLAINT/HPI, Allergies, PERTINENT LABS, PERTINENT RADIOLOGY STUDIES, PERTINENT PAST [place]   Last Updated: 14-Apr-16 15:02 by Alba Destine (MD)

## 2014-11-05 ENCOUNTER — Observation Stay
Admission: EM | Admit: 2014-11-05 | Discharge: 2014-11-09 | Disposition: A | Discharge status: Skilled Nursing Facility | Payer: BLUE CROSS/BLUE SHIELD | Attending: Internal Medicine | Admitting: Internal Medicine

## 2014-11-05 ENCOUNTER — Encounter: Payer: Self-pay | Admitting: Emergency Medicine

## 2014-11-05 ENCOUNTER — Emergency Department: Payer: BLUE CROSS/BLUE SHIELD

## 2014-11-05 DIAGNOSIS — E780 Pure hypercholesterolemia: Secondary | ICD-10-CM | POA: Diagnosis not present

## 2014-11-05 DIAGNOSIS — Z9861 Coronary angioplasty status: Secondary | ICD-10-CM | POA: Diagnosis not present

## 2014-11-05 DIAGNOSIS — Z940 Kidney transplant status: Secondary | ICD-10-CM | POA: Diagnosis not present

## 2014-11-05 DIAGNOSIS — N2581 Secondary hyperparathyroidism of renal origin: Secondary | ICD-10-CM | POA: Insufficient documentation

## 2014-11-05 DIAGNOSIS — I6781 Acute cerebrovascular insufficiency: Secondary | ICD-10-CM | POA: Diagnosis not present

## 2014-11-05 DIAGNOSIS — Z87442 Personal history of urinary calculi: Secondary | ICD-10-CM | POA: Insufficient documentation

## 2014-11-05 DIAGNOSIS — I639 Cerebral infarction, unspecified: Secondary | ICD-10-CM

## 2014-11-05 DIAGNOSIS — I249 Acute ischemic heart disease, unspecified: Secondary | ICD-10-CM | POA: Diagnosis not present

## 2014-11-05 DIAGNOSIS — E119 Type 2 diabetes mellitus without complications: Secondary | ICD-10-CM | POA: Insufficient documentation

## 2014-11-05 DIAGNOSIS — G319 Degenerative disease of nervous system, unspecified: Secondary | ICD-10-CM | POA: Insufficient documentation

## 2014-11-05 DIAGNOSIS — R262 Difficulty in walking, not elsewhere classified: Secondary | ICD-10-CM | POA: Insufficient documentation

## 2014-11-05 DIAGNOSIS — R0602 Shortness of breath: Secondary | ICD-10-CM | POA: Diagnosis not present

## 2014-11-05 DIAGNOSIS — K219 Gastro-esophageal reflux disease without esophagitis: Secondary | ICD-10-CM | POA: Insufficient documentation

## 2014-11-05 DIAGNOSIS — F329 Major depressive disorder, single episode, unspecified: Secondary | ICD-10-CM | POA: Diagnosis not present

## 2014-11-05 DIAGNOSIS — E785 Hyperlipidemia, unspecified: Secondary | ICD-10-CM | POA: Diagnosis not present

## 2014-11-05 DIAGNOSIS — D638 Anemia in other chronic diseases classified elsewhere: Secondary | ICD-10-CM | POA: Diagnosis not present

## 2014-11-05 DIAGNOSIS — Z87891 Personal history of nicotine dependence: Secondary | ICD-10-CM | POA: Diagnosis not present

## 2014-11-05 DIAGNOSIS — D631 Anemia in chronic kidney disease: Secondary | ICD-10-CM | POA: Insufficient documentation

## 2014-11-05 DIAGNOSIS — I6523 Occlusion and stenosis of bilateral carotid arteries: Secondary | ICD-10-CM | POA: Insufficient documentation

## 2014-11-05 DIAGNOSIS — R55 Syncope and collapse: Principal | ICD-10-CM | POA: Insufficient documentation

## 2014-11-05 DIAGNOSIS — Z7982 Long term (current) use of aspirin: Secondary | ICD-10-CM | POA: Diagnosis not present

## 2014-11-05 DIAGNOSIS — I2510 Atherosclerotic heart disease of native coronary artery without angina pectoris: Secondary | ICD-10-CM | POA: Insufficient documentation

## 2014-11-05 DIAGNOSIS — Z79899 Other long term (current) drug therapy: Secondary | ICD-10-CM | POA: Insufficient documentation

## 2014-11-05 DIAGNOSIS — I252 Old myocardial infarction: Secondary | ICD-10-CM | POA: Insufficient documentation

## 2014-11-05 DIAGNOSIS — I120 Hypertensive chronic kidney disease with stage 5 chronic kidney disease or end stage renal disease: Secondary | ICD-10-CM | POA: Insufficient documentation

## 2014-11-05 DIAGNOSIS — N186 End stage renal disease: Secondary | ICD-10-CM | POA: Diagnosis not present

## 2014-11-05 DIAGNOSIS — Z7902 Long term (current) use of antithrombotics/antiplatelets: Secondary | ICD-10-CM | POA: Insufficient documentation

## 2014-11-05 DIAGNOSIS — R531 Weakness: Secondary | ICD-10-CM | POA: Diagnosis not present

## 2014-11-05 DIAGNOSIS — Z992 Dependence on renal dialysis: Secondary | ICD-10-CM | POA: Insufficient documentation

## 2014-11-05 DIAGNOSIS — M109 Gout, unspecified: Secondary | ICD-10-CM | POA: Diagnosis not present

## 2014-11-05 LAB — COMPREHENSIVE METABOLIC PANEL
ALK PHOS: 52 U/L (ref 38–126)
ALT: 10 U/L — AB (ref 17–63)
ANION GAP: 9 (ref 5–15)
AST: 29 U/L (ref 15–41)
Albumin: 3.1 g/dL — ABNORMAL LOW (ref 3.5–5.0)
BUN: 10 mg/dL (ref 6–20)
CO2: 30 mmol/L (ref 22–32)
Calcium: 8.9 mg/dL (ref 8.9–10.3)
Chloride: 100 mmol/L — ABNORMAL LOW (ref 101–111)
Creatinine, Ser: 4.42 mg/dL — ABNORMAL HIGH (ref 0.61–1.24)
GFR, EST AFRICAN AMERICAN: 15 mL/min — AB (ref >60)
GFR, EST NON AFRICAN AMERICAN: 13 mL/min — AB (ref >60)
Glucose, Bld: 103 mg/dL — ABNORMAL HIGH (ref 65–99)
POTASSIUM: 3.5 mmol/L (ref 3.5–5.1)
Sodium: 139 mmol/L (ref 135–145)
Total Bilirubin: 0.4 mg/dL (ref 0.3–1.2)
Total Protein: 7.3 g/dL (ref 6.5–8.1)

## 2014-11-05 LAB — CBC WITH DIFFERENTIAL/PLATELET
BASOS PCT: 1 %
Basophils Absolute: 0.1 10*3/uL (ref 0–0.1)
EOS ABS: 1 10*3/uL — AB (ref 0–0.7)
EOS PCT: 12 %
HEMATOCRIT: 25.3 % — AB (ref 40.0–52.0)
Hemoglobin: 8 g/dL — ABNORMAL LOW (ref 13.0–18.0)
LYMPHS ABS: 2 10*3/uL (ref 1.0–3.6)
Lymphocytes Relative: 24 %
MCH: 22.8 pg — AB (ref 26.0–34.0)
MCHC: 31.6 g/dL — ABNORMAL LOW (ref 32.0–36.0)
MCV: 72.1 fL — AB (ref 80.0–100.0)
MONO ABS: 0.9 10*3/uL (ref 0.2–1.0)
MONOS PCT: 11 %
NEUTROS PCT: 52 %
Neutro Abs: 4.5 10*3/uL (ref 1.4–6.5)
Platelets: 346 10*3/uL (ref 150–440)
RBC: 3.5 MIL/uL — AB (ref 4.40–5.90)
RDW: 23.5 % — AB (ref 11.5–14.5)
WBC: 8.5 10*3/uL (ref 3.8–10.6)

## 2014-11-05 LAB — TROPONIN I
TROPONIN I: 0.03 ng/mL (ref <0.031)
TROPONIN I: 0.06 ng/mL — AB (ref <0.031)
Troponin I: 0.05 ng/mL — ABNORMAL HIGH (ref <0.031)

## 2014-11-05 LAB — PHOSPHORUS: PHOSPHORUS: 1.9 mg/dL — AB (ref 2.5–4.6)

## 2014-11-05 LAB — LACTIC ACID, PLASMA: Lactic Acid, Venous: 1 mmol/L (ref 0.5–2.0)

## 2014-11-05 LAB — MAGNESIUM: MAGNESIUM: 1.5 mg/dL — AB (ref 1.7–2.4)

## 2014-11-05 MED ORDER — AMLODIPINE BESYLATE 5 MG PO TABS
5.0000 mg | ORAL_TABLET | Freq: Every day | ORAL | Status: DC
  Administered 2014-11-06 – 2014-11-09 (×3): 5 mg via ORAL
  Filled 2014-11-05 (×3): qty 1

## 2014-11-05 MED ORDER — CALCIUM ACETATE (PHOS BINDER) 667 MG PO CAPS
1334.0000 mg | ORAL_CAPSULE | Freq: Three times a day (TID) | ORAL | Status: DC
  Administered 2014-11-06 – 2014-11-09 (×11): 1334 mg via ORAL
  Filled 2014-11-05 (×11): qty 2

## 2014-11-05 MED ORDER — RENA-VITE PO TABS
1.0000 | ORAL_TABLET | Freq: Every day | ORAL | Status: DC
  Administered 2014-11-05 – 2014-11-08 (×4): 1 via ORAL
  Filled 2014-11-05 (×7): qty 1

## 2014-11-05 MED ORDER — ACETAMINOPHEN 650 MG RE SUPP: 650.0000 mg | Freq: Four times a day (QID) | RECTAL | Status: DC | PRN

## 2014-11-05 MED ORDER — METOPROLOL TARTRATE 50 MG PO TABS
50.0000 mg | ORAL_TABLET | Freq: Two times a day (BID) | ORAL | Status: DC
  Administered 2014-11-05 – 2014-11-09 (×7): 50 mg via ORAL
  Filled 2014-11-05 (×7): qty 1

## 2014-11-05 MED ORDER — ATORVASTATIN CALCIUM 20 MG PO TABS
40.0000 mg | ORAL_TABLET | Freq: Every day | ORAL | Status: DC
  Administered 2014-11-06 – 2014-11-09 (×3): 40 mg via ORAL
  Filled 2014-11-05 (×3): qty 2

## 2014-11-05 MED ORDER — NITROGLYCERIN 0.4 MG SL SUBL: 0.4000 mg | SUBLINGUAL_TABLET | SUBLINGUAL | Status: DC | PRN

## 2014-11-05 MED ORDER — HEPARIN SODIUM (PORCINE) 5000 UNIT/ML IJ SOLN
5000.0000 [IU] | Freq: Three times a day (TID) | INTRAMUSCULAR | Status: DC
  Administered 2014-11-05 – 2014-11-09 (×12): 5000 [IU] via SUBCUTANEOUS
  Filled 2014-11-05 (×12): qty 1

## 2014-11-05 MED ORDER — PANTOPRAZOLE SODIUM 40 MG PO TBEC
40.0000 mg | DELAYED_RELEASE_TABLET | Freq: Every day | ORAL | Status: DC
  Administered 2014-11-06 – 2014-11-09 (×4): 40 mg via ORAL
  Filled 2014-11-05 (×4): qty 1

## 2014-11-05 MED ORDER — ACETAMINOPHEN 325 MG PO TABS: 650.0000 mg | ORAL_TABLET | Freq: Four times a day (QID) | ORAL | Status: DC | PRN

## 2014-11-05 MED ORDER — CLOPIDOGREL BISULFATE 75 MG PO TABS
75.0000 mg | ORAL_TABLET | Freq: Every day | ORAL | Status: DC
  Administered 2014-11-06 – 2014-11-09 (×4): 75 mg via ORAL
  Filled 2014-11-05 (×4): qty 1

## 2014-11-05 MED ORDER — ALLOPURINOL 100 MG PO TABS
100.0000 mg | ORAL_TABLET | Freq: Every day | ORAL | Status: DC
  Administered 2014-11-06 – 2014-11-09 (×4): 100 mg via ORAL
  Filled 2014-11-05 (×4): qty 1

## 2014-11-05 MED ORDER — ONDANSETRON HCL 4 MG PO TABS: 4.0000 mg | ORAL_TABLET | Freq: Four times a day (QID) | ORAL | Status: DC | PRN

## 2014-11-05 MED ORDER — ONDANSETRON HCL 4 MG/2ML IJ SOLN
4.0000 mg | Freq: Four times a day (QID) | INTRAMUSCULAR | Status: DC | PRN
  Administered 2014-11-08: 4 mg via INTRAVENOUS
  Filled 2014-11-05: qty 2

## 2014-11-05 MED ORDER — ASPIRIN 81 MG PO TBEC
81.0000 mg | DELAYED_RELEASE_TABLET | Freq: Every day | ORAL | Status: DC
  Administered 2014-11-06 – 2014-11-09 (×4): 81 mg via ORAL
  Filled 2014-11-05 (×8): qty 1

## 2014-11-05 NOTE — ED Provider Notes (Signed)
[facility] Emergency Department Provider Note  ____________________________________________  Time seen: On arrival  I have reviewed the triage vital signs and the nursing notes.   HISTORY  Chief Complaint Code Sepsis  History limited by patient appears confused and it is Unclear what language patient speaks at this time   HPI [name] is a 64 y.o. male who presents under very confusing circumstances. Reportedly patient was receiving dialysis approximately one hour ago and had a possible cardiac arrest, dialysis staff did CPR on the patient but when EMS arrived the patient had his eyes open. I am unable to communicate with the patient we are unsure what language he speaks.      Past Medical History  Diagnosis Date  . Chronic disease anemia   . Hypertension   . High cholesterol   . Type II diabetes mellitus   . History of blood transfusion     "related to anemia"  . GERD (gastroesophageal reflux disease)   . History of stomach ulcers   . Gout   . ESRD (end stage renal disease) on dialysis     "[place]; [place]; [name]" (09/29/2014)    Patient Active Problem List   Diagnosis Date Noted  . Contaminant given to patient   . Shortness of breath   . Viridans streptococci infection   . Polymicrobial bacterial infection   . Bacteremia   . 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 ESRD on dialysis   . 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 Acute coronary syndrome 09/29/2014    Past Surgical History  Procedure Laterality Date  . Kidney transplant Right 2004  . Peritoneal catheter insertion  02/2014  . Peritoneal catheter removal  08/2014  . Arteriovenous graft placement Left ~ 1996  . Thrombectomy / arteriovenous graft revision  2015  . Nephrectomy transplanted organ  2015  . Cardiac catheterization  09/29/2014    ""  . Percutaneous coronary stent intervention (pci-s) N/A  09/30/2014    Procedure: PERCUTANEOUS CORONARY STENT INTERVENTION (PCI-S);  Surgeon: [name], MD;  Location: [facility] CATH LAB;  Service: Cardiovascular;  Laterality: N/A;    Current Outpatient Rx  Name  Route  Sig  Dispense  Refill  . allopurinol (ZYLOPRIM) 100 MG tablet   Oral   Take 100 mg by mouth daily.      10   . amLODipine (NORVASC) 5 MG tablet   Oral   Take 1 tablet (5 mg total) by mouth daily.   30 tablet   3   . aspirin 81 MG EC tablet   Oral   Take 81 mg by mouth daily.      10   . atorvastatin (LIPITOR) 40 MG tablet   Oral   Take 1 tablet (40 mg total) by mouth daily at 6 PM.   30 tablet   3   . BAYER CONTOUR TEST test strip            1     Dispense as written.   . calcium acetate (PHOSLO) 667 MG capsule   Oral   Take 1,334 mg by mouth 3 (three) times daily with meals.         . clopidogrel (PLAVIX) 75 MG tablet   Oral   Take 1 tablet (75 mg total) by mouth daily with breakfast.   30 tablet   11   . metoprolol (LOPRESSOR) 50 MG tablet   Oral  Take 50 mg by mouth 2 (two) times daily.      10   . multivitamin (RENA-VIT) TABS tablet   Oral   Take 1 tablet by mouth at bedtime.   30 tablet   3   . nitroGLYCERIN (NITROSTAT) 0.4 MG SL tablet   Sublingual   Place 1 tablet (0.4 mg total) under the tongue every 5 (five) minutes x 3 doses as needed for chest pain.   25 tablet   12   . ondansetron (ZOFRAN-ODT) 4 MG disintegrating tablet   Oral   Take 4 mg by mouth 4 (four) times daily as needed. For nausea and vomiting      0   . pantoprazole (PROTONIX) 40 MG tablet   Oral   Take 40 mg by mouth daily.           Allergies Ivp dye  No family history on file.  Social History History  Substance Use Topics  . Smoking status: Former Smoker    Types: Cigarettes  . Smokeless tobacco: Never Used     Comment: "quit smoking cigarettes in the 1980's"  . Alcohol Use: No    Review of Systems   L5 caveat: Patient with altered  mental status  ____________________________________________   PHYSICAL EXAM:  VITAL SIGNS: ED Triage Vitals  Enc Vitals Group     BP --      Pulse --      Resp --      Temp --      Temp src --      SpO2 --      Weight --      Height --      Head Cir --      Peak Flow --      Pain Score --      Pain Loc --      Pain Edu? --      Excl. in [place]? --      Constitutional: Alert. Ill-appearing Eyes: Conjunctivae are normal. PERRL. Normal extraocular movements. ENT   Head: Normocephalic and atraumatic.   Mouth/Throat: Mucous membranes are moist.   Neck: No stridor. Cardiovascular: Normal rate, regular rhythm. Normal and symmetric distal pulses are present in all extremities. No murmurs, rubs, or gallops. Respiratory: Normal respiratory effort without tachypnea nor retractions. Breath sounds are clear and equal bilaterally. No wheezes/rales/rhonchi. Gastrointestinal: Soft and nontender. No distention. There is no CVA tenderness. Genitourinary: deferred Musculoskeletal: Nontender with normal range of motion in all extremities. No joint effusions.  No lower extremity tenderness nor edema. Neurologic:  Patient appears to move all extremities Skin:  Skin is warm, dry and intact. No rash noted.   ____________________________________________    LABS (pertinent positives/negatives)  Pending  ____________________________________________   EKG  ED ECG REPORT I, Lavonia Drafts, the attending physician, personally viewed and interpreted this ECG.   Date: 11/05/2014  EKG Time: 2:03 PM  Rate: 77  Rhythm: Normal sinus rhythm ,prolonged QT interval  Axis: Normal  Intervals:none  ST&T Change: Nonspecific      ____________________________________________    RADIOLOGY  Unremarkable chest x-ray, CT head no acute distress  ____________________________________________   PROCEDURES  Procedure(s) performed: Peripheral IV placed in right antecubital fossa using  18-gauge IV  Critical Care performed: None  ____________________________________________   INITIAL IMPRESSION / ASSESSMENT AND PLAN / ED COURSE  Pertinent labs & imaging results that were available during my care of the patient were reviewed by me and considered in my medical decision  making (see chart for details).  Given patient's initial hypothermia and questionable cardiac arrest versus syncope patient will require admission for observation and further workup   Care signed out to Dr. Dineen Kid ____________________________________________   FINAL CLINICAL IMPRESSION(S) / ED DIAGNOSES  Cardiac arrest  Lavonia Drafts, MD 11/05/14 1538

## 2014-11-05 NOTE — H&P (Signed)
[facility] [place] NAME: [name]    MR#:  [id number]  DATE OF BIRTH:  1951/04/17  DATE OF ADMISSION:  11/05/2014  PRIMARY CARE PHYSICIAN: Dr. [name]  REQUESTING/REFERRING PHYSICIAN: Dr. Clearnce Hasten.   CHIEF COMPLAINT:   Chief Complaint  Patient presents with  . Code Sepsis    from dialysis - arrest,    Syncope, suspected cardiac arrest.   HISTORY OF PRESENT ILLNESS:  [name]  is a 64 y.o. male with a known history of end-stage renal disease on hemodialysis, hypertension, hyperlipidemia, type 2 diabetes, anemia of chronic disease, gout, who presented to the hospital due to a syncopal event. Patient apparently at dialysis became unconscious and CPR was initiated. There is no rhythm strip from this event so it's unclear whether patient truly had a cardiac arrest. EMS was called and when they arrived patient was awake and had a pulse and a rhythm. Patient Himself cannot recall all the events and is somewhat confused presently. Patient himself speaks limited English and therefore most of the history obtained from the wife at bedside.  Patient presently denies any chest pain or any palpitations nausea vomiting diaphoresis or any other associated symptoms.  PAST MEDICAL HISTORY:   Past Medical History  Diagnosis Date  . Chronic disease anemia   . Hypertension   . High cholesterol   . Type II diabetes mellitus   . History of blood transfusion     "related to anemia"  . GERD (gastroesophageal reflux disease)   . History of stomach ulcers   . Gout   . ESRD (end stage renal disease) on dialysis     "[place]; [place]; [name]" (09/29/2014)    PAST SURGICAL HISTORY:   Past Surgical History  Procedure Laterality Date  . Kidney transplant Right 2004  . Peritoneal catheter insertion  02/2014  . Peritoneal catheter removal  08/2014  . Arteriovenous graft placement Left ~ 1996  . Thrombectomy / arteriovenous graft revision   2015  . Nephrectomy transplanted organ  2015  . Cardiac catheterization  09/29/2014    "[place]"  . Percutaneous coronary stent intervention (pci-s) N/A 09/30/2014    Procedure: PERCUTANEOUS CORONARY STENT INTERVENTION (PCI-S);  Surgeon: [name], MD;  Location: [facility] CATH LAB;  Service: Cardiovascular;  Laterality: N/A;    SOCIAL HISTORY:   History  Substance Use Topics  . Smoking status: Former Smoker    Types: Cigarettes  . Smokeless tobacco: Never Used     Comment: "quit smoking cigarettes in the 1980's"  . Alcohol Use: No    FAMILY HISTORY:  History reviewed. No pertinent family history.  DRUG ALLERGIES:   Allergies  Allergen Reactions  . Ivp Dye [Iodinated Diagnostic Agents]     REVIEW OF SYSTEMS:   Review of Systems  Constitutional: Negative for fever and weight loss.  HENT: Negative for congestion, nosebleeds and tinnitus.   Eyes: Negative for blurred vision, double vision and redness.  Respiratory: Negative for cough, hemoptysis and shortness of breath.   Cardiovascular: Negative for chest pain, orthopnea, leg swelling and PND.  Gastrointestinal: Negative for nausea, vomiting, abdominal pain, diarrhea and melena.  Genitourinary: Negative for dysuria, urgency and hematuria.  Musculoskeletal: Negative for joint pain and falls.  Neurological: Positive for loss of consciousness (syncope). Negative for dizziness, tingling, sensory change, focal weakness, seizures, weakness and headaches.  Endo/Heme/Allergies: Negative for polydipsia. Does not bruise/bleed easily.  Psychiatric/Behavioral: Negative for depression and memory loss. The patient is not  nervous/anxious.     MEDICATIONS AT HOME:   Prior to Admission medications   Medication Sig Start Date End Date Taking? Authorizing Provider  allopurinol (ZYLOPRIM) 100 MG tablet Take 100 mg by mouth daily. 09/23/14   Historical Provider, MD  amLODipine (NORVASC) 5 MG tablet Take 1 tablet (5 mg total) by mouth daily.  10/02/14   [name], MD  aspirin 81 MG EC tablet Take 81 mg by mouth daily. 09/23/14   Historical Provider, MD  atorvastatin (LIPITOR) 40 MG tablet Take 1 tablet (40 mg total) by mouth daily at 6 PM. 10/02/14   [name], MD  BAYER CONTOUR TEST test strip  09/07/14   Historical Provider, MD  calcium acetate (PHOSLO) 667 MG capsule Take 1,334 mg by mouth 3 (three) times daily with meals. 05/04/14   Historical Provider, MD  clopidogrel (PLAVIX) 75 MG tablet Take 1 tablet (75 mg total) by mouth daily with breakfast. 10/02/14   [name], MD  metoprolol (LOPRESSOR) 50 MG tablet Take 50 mg by mouth 2 (two) times daily. 09/23/14   Historical Provider, MD  multivitamin (RENA-VIT) TABS tablet Take 1 tablet by mouth at bedtime. 10/02/14   [name], MD  nitroGLYCERIN (NITROSTAT) 0.4 MG SL tablet Place 1 tablet (0.4 mg total) under the tongue every 5 (five) minutes x 3 doses as needed for chest pain. 10/02/14   [name], MD  ondansetron (ZOFRAN-ODT) 4 MG disintegrating tablet Take 4 mg by mouth 4 (four) times daily as needed. For nausea and vomiting 08/31/14   Historical Provider, MD  pantoprazole (PROTONIX) 40 MG tablet Take 40 mg by mouth daily. 01/17/14   Historical Provider, MD      VITAL SIGNS:  Blood pressure 141/74, pulse 73, temperature 97.2 F (36.2 C), temperature source Oral, resp. rate 16, height 5' (1.524 m), weight 57.499 kg (126 lb 12.2 oz), SpO2 99 %.  PHYSICAL EXAMINATION:  Physical Exam  GENERAL:  64 y.o.-year-old pale apprearing patient lying in the bed with no acute distress.  EYES: Pupils equal, round, reactive to light and accommodation. No scleral icterus. Extraocular muscles intact.  HEENT: Head atraumatic, normocephalic. Oropharynx and nasopharynx clear. No oropharyngeal erythema, moist oral mucosa  NECK:  Supple, no jugular venous distention. No thyroid enlargement, no tenderness.  LUNGS: Normal breath sounds bilaterally, no wheezing, rales, rhonchi. No use of  accessory muscles of respiration.  CARDIOVASCULAR: S1, S2 normal.  + II/VI SEM at RSB, no rubs, clicks, or gallops.  ABDOMEN: Soft, nontender, nondistended. Bowel sounds present. No organomegaly or mass.  EXTREMITIES: No pedal edema, cyanosis, or clubbing. + 2 pedal & radial pulses b/l.   NEUROLOGIC: Cranial nerves II through XII are intact. No focal Motor or sensory deficits appreciated b/l PSYCHIATRIC: The patient is alert and oriented x 3. Good affect.  SKIN: No obvious rash, lesion, or ulcer.   Left upper ext. AV fistula with good bruit, good thrill.    LABORATORY PANEL:   CBC  Recent Labs Lab 11/05/14 1547  WBC 8.5  HGB 8.0*  HCT 25.3*  PLT 346   ------------------------------------------------------------------------------------------------------------------  Chemistries   Recent Labs Lab 11/05/14 1547  NA 139  K 3.5  CL 100*  CO2 30  GLUCOSE 103*  BUN 10  CREATININE 4.42*  CALCIUM 8.9  MG 1.5*  AST 29  ALT 10*  ALKPHOS 52  BILITOT 0.4   ------------------------------------------------------------------------------------------------------------------  Cardiac Enzymes  Recent Labs Lab 11/05/14 1547  TROPONINI 0.05*   ------------------------------------------------------------------------------------------------------------------  RADIOLOGY:  Ct Head  Wo Contrast  11/05/2014   CLINICAL DATA:  Patient became acutely unresponsive while undergoing hemodialysis earlier today. Hypothermia. Recent subdural hematoma.  EXAM: CT HEAD WITHOUT CONTRAST  TECHNIQUE: Contiguous axial images were obtained from the base of the skull through the vertex without intravenous contrast.  COMPARISON:  10/09/2014.  FINDINGS: Interval resolution of the left parafalcine subdural hematoma since the prior examination. Moderate cortical atrophy and moderate to severe deep atrophy. Mild changes of small vessel disease of the white matter diffusely. No mass lesion. No midline shift. No  acute hemorrhage or hematoma. No extra-axial fluid collections. No evidence of acute infarction.  Acute No skull fracture or other focal osseous abnormality involving the skull. Small mucous retention cyst or polyp in the right maxillary sinus. Remaining visualized paranasal sinuses, bilateral mastoid air cells and bilateral middle ear cavities well-aerated. Bilateral carotid siphon and left vertebral artery atherosclerosis.  IMPRESSION: 1. No acute intracranial abnormality. 2. Interval resolution of the left parafalcine subdural hematoma since the examination 1 month ago. 3. Moderate to severe cortical and deep atrophy and mild chronic microvascular ischemic changes of the white matter.   Electronically Signed   By: Evangeline Dakin M.D.   On: 11/05/2014 14:40   Dg Chest Portable 1 View  11/05/2014   CLINICAL DATA:  Patient status post cardiac arrest swelling dialysis.  EXAM: PORTABLE CHEST - 1 VIEW  COMPARISON:  Chest radiograph 10/09/2014  FINDINGS: Monitoring leads overlie the patient. Vascular graft within the left upper extremity. Stable enlarged cardiac and mediastinal contours. Minimal heterogeneous opacities left lung base. Right lung is well aerated. No pleural effusion or pneumothorax.  IMPRESSION: Minimal heterogeneous opacities left lung base likely represent atelectasis.  Cardiomegaly.   Electronically Signed   By: Lovey Newcomer M.D.   On: 11/05/2014 14:47     IMPRESSION AND PLAN:   64 year old male with past medical history of end-stage renal disease on hemodialysis, hypertension, hyperlipidemia, peripheral vascular disease, recent subdural hematoma secondary to a fall, secondary hyperparathyroidism, gout, GERD, presented to the hospital from dialysis due to a suspected syncopal event.  #1 syncope/cardiac arrest - patient had an event at dialysis and it's unclear whether it was a true syncopal event or even a true cardiac arrest.  Patient apparently became unresponsive momentarily and CPR was  initiated although patient never received any cardiac meds or any shocks.  Upon EMS arrival patient had a pulse and blood pressure and was awake. - We will observe the patient on telemetry for any arrhythmias - Get a two-dimensional echocardiogram and carotid duplex and will cycle his cardiac markers. - He is clinically not hypotensive presently and has no evidence of sepsis  #2 end-stage renal disease on hemodialysis patient received partial dialysis today I will get nephrology consult in the morning  - his potassium is currently normal.  #3 gout - no acute attack continue allopurinol Exline  #4 hypertension presently hemodynamically stable continue with his metoprolol and Norvasc  #5 history of recent subdural hematoma - he had a repeat CT done here which shows resolution of the subdural hematoma. I will continue aspirin and Plavix.  #6 GERD - continue Protonix   All the records are reviewed and case discussed with ED provider. Management plans discussed with the patient, family and they are in agreement.  CODE STATUS: Full  TOTAL TIME TAKING CARE OF THIS PATIENT: 45 minutes.    Henreitta Leber M.D on 11/05/2014 at 7:07 PM  Between 7am to 6pm - Pager - [phone]  After  6pm go to [email or web address] - password [email or web address] [facility]  [facility]  Office  [phone]  CC: Primary care physician; Dr. Candiss Norse, [facility]

## 2014-11-05 NOTE — ED Notes (Signed)
4 attempts at iv access. Can only use rt arm d/t dialysis port. Dr aware and will look with [place].

## 2014-11-05 NOTE — ED Provider Notes (Signed)
  Physical Exam  BP 136/72 mmHg  Pulse 78  Temp(Src) 95.6 F (35.3 C) (Oral)  Ht 5' (1.524 m)  Wt 126 lb 12.2 oz (57.499 kg)  BMI 24.76 kg/m2  SpO2 100%   Patient with cardiac arrest with chest compressions at dialysis. Hypothermic on arrival. Pending labs.  Chest x-ray with atelectasis and cardiomegaly Resolution of subdural hematoma. No acute abnormality on head CT.   Physical Exam ----------------------------------------- 6:25 PM on 11/05/2014 ----------------------------------------- Patient resting comfortably. Labs and imaging reassuring. We'll admit to the hospital for telemetry monitoring. Unclear preceding event that precipitated loss of pulses. Possible syncopal episode versus hypertensive on dialysis. Signed out to Dr. Verdell Carmine.   ED Course  Procedures        Orbie Pyo, MD 11/05/14 1859

## 2014-11-05 NOTE — ED Notes (Signed)
Pt back with nurse from ct and xr

## 2014-11-05 NOTE — ED Notes (Signed)
On wife's arrival she states this mental states is normal for him, and that he recently had a uti

## 2014-11-05 NOTE — ED Notes (Signed)
Was in dialysis x 2 hrs - staff states arrested and they did 2 rounds cpr - on ems arrival pt responsive to stimuli. Unsure what language pt speaks. Temp oral 94

## 2014-11-06 ENCOUNTER — Observation Stay: Payer: BLUE CROSS/BLUE SHIELD

## 2014-11-06 ENCOUNTER — Observation Stay
Admit: 2014-11-06 | Discharge: 2014-11-06 | Disposition: A | Discharge status: Home / Self Care | Payer: BLUE CROSS/BLUE SHIELD | Attending: Specialist | Admitting: Specialist

## 2014-11-06 LAB — CBC
HCT: 24.1 % — ABNORMAL LOW (ref 40.0–52.0)
Hemoglobin: 7.5 g/dL — ABNORMAL LOW (ref 13.0–18.0)
MCH: 22.8 pg — ABNORMAL LOW (ref 26.0–34.0)
MCHC: 31.2 g/dL — ABNORMAL LOW (ref 32.0–36.0)
MCV: 73.2 fL — ABNORMAL LOW (ref 80.0–100.0)
Platelets: 313 10*3/uL (ref 150–440)
RBC: 3.29 MIL/uL — AB (ref 4.40–5.90)
RDW: 23.5 % — ABNORMAL HIGH (ref 11.5–14.5)
WBC: 6.1 10*3/uL (ref 3.8–10.6)

## 2014-11-06 LAB — BASIC METABOLIC PANEL
Anion gap: 8 (ref 5–15)
BUN: 13 mg/dL (ref 6–20)
CO2: 30 mmol/L (ref 22–32)
Calcium: 8.9 mg/dL (ref 8.9–10.3)
Chloride: 102 mmol/L (ref 101–111)
Creatinine, Ser: 5.85 mg/dL — ABNORMAL HIGH (ref 0.61–1.24)
GFR calc Af Amer: 11 mL/min — ABNORMAL LOW (ref >60)
GFR calc non Af Amer: 9 mL/min — ABNORMAL LOW (ref >60)
Glucose, Bld: 134 mg/dL — ABNORMAL HIGH (ref 65–99)
Potassium: 3.3 mmol/L — ABNORMAL LOW (ref 3.5–5.1)
Sodium: 140 mmol/L (ref 135–145)

## 2014-11-06 MED ORDER — POTASSIUM CHLORIDE CRYS ER 10 MEQ PO TBCR
10.0000 meq | EXTENDED_RELEASE_TABLET | Freq: Once | ORAL | Status: AC
  Administered 2014-11-06: 10 meq via ORAL
  Filled 2014-11-06: qty 1

## 2014-11-06 NOTE — Progress Notes (Signed)
[facility]  ROUNDING NOTE   Subjective:   Admitted yesterday after cardiac arrest at dialysis. Patient became unresponsive after 2.5 hour of hemodialysis with minimal ultrafiltration. 2 litres NS given, 2 rounds of CPR given. By the time patient got to ED, he was awake and conversive.  Now undergoing cardiac ischemic work up and syncope work up.  Currently at [facility]. Not ambulating  Objective:  Vital signs in last 24 hours:  Temp:  [95.6 F (35.3 C)-98.5 F (36.9 C)] 97.8 F (36.6 C) (05/22 0607) Pulse Rate:  [69-83] 70 (05/22 0710) Resp:  [7-22] 18 (05/22 0710) BP: (113-217)/(60-126) 126/60 mmHg (05/22 0710) SpO2:  [97 %-100 %] 100 % (05/22 0710) Weight:  [53.615 kg (118 lb 3.2 oz)-57.499 kg (126 lb 12.2 oz)] 53.615 kg (118 lb 3.2 oz) (05/22 0607)  Weight change:  Filed Weights   11/05/14 1436 11/06/14 0607  Weight: 57.499 kg (126 lb 12.2 oz) 53.615 kg (118 lb 3.2 oz)    Intake/Output:     Intake/Output this shift:     Physical Exam: General: NAD,   Head: Normocephalic, atraumatic. Moist oral mucosal membranes  Eyes: Anicteric, PERRL  Neck: Supple, trachea midline  Lungs:  Clear to auscultation  Heart: Regular rate and rhythm  Abdomen:  Soft, nontender,   Extremities: no peripheral edema.  Neurologic: Nonfocal, moving all four extremities  Skin: No lesions  Access: Left forarm AVG    Basic Metabolic Panel:  Recent Labs Lab 11/05/14 1547 11/06/14 0423  NA 139 140  K 3.5 3.3*  CL 100* 102  CO2 30 30  GLUCOSE 103* 134*  BUN 10 13  CREATININE 4.42* 5.85*  CALCIUM 8.9 8.9  MG 1.5*  --   PHOS 1.9*  --     Liver Function Tests:  Recent Labs Lab 11/05/14 1547  AST 29  ALT 10*  ALKPHOS 52  BILITOT 0.4  PROT 7.3  ALBUMIN 3.1*   No results for input(s): LIPASE, AMYLASE in the last 168 hours. No results for input(s): AMMONIA in the last 168 hours.  CBC:  Recent Labs Lab 11/05/14 1547 11/06/14 0423  WBC 8.5 6.1   NEUTROABS 4.5  --   HGB 8.0* 7.5*  HCT 25.3* 24.1*  MCV 72.1* 73.2*  PLT 346 313    Cardiac Enzymes:  Recent Labs Lab 11/05/14 1547 11/05/14 1911 11/05/14 2301  TROPONINI 0.05* 0.03 0.06*    BNP: Invalid input(s): POCBNP  CBG: No results for input(s): GLUCAP in the last 168 hours.  Microbiology: Results for orders placed or performed during the hospital encounter of 11/05/14  Culture, blood (routine x 2)     Status: None (Preliminary result)   Collection Time: 11/05/14  3:20 PM  Result Value Ref Range Status   Specimen Description BLOOD  Final   Special Requests NONE  Final   Culture NO GROWTH < 24 HOURS  Final   Report Status PENDING  Incomplete  Culture, blood (routine x 2)     Status: None (Preliminary result)   Collection Time: 11/05/14  3:47 PM  Result Value Ref Range Status   Specimen Description BLOOD  Final   Special Requests NONE  Final   Culture NO GROWTH < 24 HOURS  Final   Report Status PENDING  Incomplete    Coagulation Studies: No results for input(s): LABPROT, INR in the last 72 hours.  Urinalysis: No results for input(s): COLORURINE, LABSPEC, PHURINE, GLUCOSEU, HGBUR, BILIRUBINUR, KETONESUR, PROTEINUR, UROBILINOGEN, NITRITE, LEUKOCYTESUR in the last  72 hours.  Invalid input(s): APPERANCEUR    Imaging: Ct Head Wo Contrast  11/05/2014   CLINICAL DATA:  Patient became acutely unresponsive while undergoing hemodialysis earlier today. Hypothermia. Recent subdural hematoma.  EXAM: CT HEAD WITHOUT CONTRAST  TECHNIQUE: Contiguous axial images were obtained from the base of the skull through the vertex without intravenous contrast.  COMPARISON:  10/09/2014.  FINDINGS: Interval resolution of the left parafalcine subdural hematoma since the prior examination. Moderate cortical atrophy and moderate to severe deep atrophy. Mild changes of small vessel disease of the white matter diffusely. No mass lesion. No midline shift. No acute hemorrhage or hematoma. No  extra-axial fluid collections. No evidence of acute infarction.  Acute No skull fracture or other focal osseous abnormality involving the skull. Small mucous retention cyst or polyp in the right maxillary sinus. Remaining visualized paranasal sinuses, bilateral mastoid air cells and bilateral middle ear cavities well-aerated. Bilateral carotid siphon and left vertebral artery atherosclerosis.  IMPRESSION: 1. No acute intracranial abnormality. 2. Interval resolution of the left parafalcine subdural hematoma since the examination 1 month ago. 3. Moderate to severe cortical and deep atrophy and mild chronic microvascular ischemic changes of the white matter.   Electronically Signed   By: Evangeline Dakin M.D.   On: 11/05/2014 14:40   US Carotid Bilateral  11/06/2014   CLINICAL DATA:  CVA and syncope.  EXAM: BILATERAL CAROTID DUPLEX ULTRASOUND  TECHNIQUE: Pearline Cables scale imaging, color Doppler and duplex ultrasound were performed of bilateral carotid and vertebral arteries in the neck.  COMPARISON:  None.  FINDINGS: Criteria: Quantification of carotid stenosis is based on velocity parameters that correlate the residual internal carotid diameter with NASCET-based stenosis levels, using the diameter of the distal internal carotid lumen as the denominator for stenosis measurement.  The following velocity measurements were obtained:  RIGHT  ICA:  69 cm/sec  CCA:  71 cm/sec  SYSTOLIC ICA/CCA RATIO:  1  DIASTOLIC ICA/CCA RATIO:  1.3  ECA:  91 cm/sec  LEFT  ICA:  69 cm/sec  CCA:  82 cm/sec  SYSTOLIC ICA/CCA RATIO:  0.8  DIASTOLIC ICA/CCA RATIO:  1.3  ECA:  89 cm/sec  RIGHT CAROTID ARTERY: Small amount of echogenic plaque at the right carotid bulb. The right carotid arteries are patent without significant stenosis. Normal waveforms and velocities in the right internal carotid artery.  RIGHT VERTEBRAL ARTERY: Antegrade flow and normal waveform in the right vertebral artery.  LEFT CAROTID ARTERY: Small amount of echogenic plaque in  the distal left common carotid artery. Small amount of echogenic plaque at the left carotid bulb. Normal waveforms and velocities in the left internal carotid artery.  LEFT VERTEBRAL ARTERY: Antegrade flow and normal waveform in the left vertebral artery.  IMPRESSION: Mild atherosclerotic disease in the carotid arteries without significant stenosis. Estimated degree of stenosis in the internal carotid arteries is less than 50% bilaterally.  Patent vertebral arteries.   Electronically Signed   By: Markus Daft M.D.   On: 11/06/2014 09:55   Dg Chest Portable 1 View  11/05/2014   CLINICAL DATA:  Patient status post cardiac arrest swelling dialysis.  EXAM: PORTABLE CHEST - 1 VIEW  COMPARISON:  Chest radiograph 10/09/2014  FINDINGS: Monitoring leads overlie the patient. Vascular graft within the left upper extremity. Stable enlarged cardiac and mediastinal contours. Minimal heterogeneous opacities left lung base. Right lung is well aerated. No pleural effusion or pneumothorax.  IMPRESSION: Minimal heterogeneous opacities left lung base likely represent atelectasis.  Cardiomegaly.   Electronically Signed  By: Lovey Newcomer M.D.   On: 11/05/2014 14:47     Medications:     . allopurinol  100 mg Oral Daily  . amLODipine  5 mg Oral Daily  . aspirin  81 mg Oral Daily  . atorvastatin  40 mg Oral q1800  . calcium acetate  1,334 mg Oral TID WC  . clopidogrel  75 mg Oral Q breakfast  . heparin  5,000 Units Subcutaneous 3 times per day  . metoprolol  50 mg Oral BID  . multivitamin  1 tablet Oral QHS  . pantoprazole  40 mg Oral Daily   acetaminophen **OR** acetaminophen, nitroGLYCERIN, ondansetron **OR** ondansetron (ZOFRAN) IV  Assessment/ Plan:  [name] is a 64 y.o. 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 Back on peritoneal dialysis from 9/15 to 3/16, then transitioned to  Hemodialysis.  Transplant nephrectomy 11/15  Currently at [facility]   1. ESRD: TTS schedule. 2.5 hours of dialysis yesterday. Not in volume overload. No acute indication for dialysis today.  - Monitor daily for dialysis - next dialysis tentatively scheduled for Tuesday  2. Syncope/Cardiac Arrest: no elevation in troponins. Agree with ischemic and syncope work up. Echocardiogram and carotid dopplers reviewed with patient.   3. Hypertension: extremely elevated last night. Well controlled now.  - currently on amlodipine and metoprolol.   4. Secondary Hyperparathyroidism:  - continue calcium acetate for binding  5. Anemia of chronic kidney disease: hemoglobin 7.5     LOS:  [name] 5/22/201610:22 AM

## 2014-11-06 NOTE — Progress Notes (Signed)
NSR. Room air. Takes meds ok. US carotid showed less than 50% stenosis. Neuro checks are stable. Wife was at the bedside. Interpretor was used. Pt speaks limited english and needs [place]. Hemoglobin 7.5. L FA fistula. Pt has not showed any signs of pain. Does not make any pain. Pt has no further concerns at this time.

## 2014-11-06 NOTE — Progress Notes (Signed)
*  PRELIMINARY RESULTS* Echocardiogram 2D Echocardiogram has been performed.  [name] Stills 11/06/2014, 8:05 AM

## 2014-11-06 NOTE — Progress Notes (Signed)
CSW confirmed with Lelon Frohlich at [facility] that pt is from their facility and is able to return at d/c. Full eval to follow. FL2 complete and on chart for MD signature. Wandra Feinstein, MSW, LCSW [phone] (weekend coverage)

## 2014-11-06 NOTE — Progress Notes (Signed)
[name] is a 64 y.o. male patient. 1. Syncope and collapse   2. Syncope   3. CVA (cerebral infarction)    Past Medical History  Diagnosis Date  . Chronic disease anemia   . Hypertension   . High cholesterol   . Type II diabetes mellitus   . History of blood transfusion     "related to anemia"  . GERD (gastroesophageal reflux disease)   . History of stomach ulcers   . Gout   . ESRD (end stage renal disease) on dialysis     "[facility]; [place]; [name]" (09/29/2014)   Current Facility-Administered Medications  Medication Dose Route Frequency Provider Last Rate Last Dose  . acetaminophen (TYLENOL) tablet 650 mg  650 mg Oral Q6H PRN Henreitta Leber, MD       Or  . acetaminophen (TYLENOL) suppository 650 mg  650 mg Rectal Q6H PRN Henreitta Leber, MD      . allopurinol (ZYLOPRIM) tablet 100 mg  100 mg Oral Daily Henreitta Leber, MD      . amLODipine (NORVASC) tablet 5 mg  5 mg Oral Daily Henreitta Leber, MD      . aspirin EC tablet 81 mg  81 mg Oral Daily Henreitta Leber, MD      . atorvastatin (LIPITOR) tablet 40 mg  40 mg Oral q1800 Henreitta Leber, MD      . calcium acetate (PHOSLO) capsule 1,334 mg  1,334 mg Oral TID WC Henreitta Leber, MD      . clopidogrel (PLAVIX) tablet 75 mg  75 mg Oral Q breakfast Henreitta Leber, MD      . heparin injection 5,000 Units  5,000 Units Subcutaneous 3 times per day Henreitta Leber, MD   5,000 Units at 11/06/14 0535  . metoprolol (LOPRESSOR) tablet 50 mg  50 mg Oral BID Henreitta Leber, MD   50 mg at 11/05/14 2308  . multivitamin (RENA-VIT) tablet 1 tablet  1 tablet Oral QHS Henreitta Leber, MD   1 tablet at 11/05/14 2316  . nitroGLYCERIN (NITROSTAT) SL tablet 0.4 mg  0.4 mg Sublingual Q5 Min x 3 PRN Henreitta Leber, MD      . ondansetron (ZOFRAN) tablet 4 mg  4 mg Oral Q6H PRN Henreitta Leber, MD       Or  . ondansetron (ZOFRAN) injection 4 mg  4 mg Intravenous Q6H PRN Henreitta Leber, MD      . pantoprazole (PROTONIX) EC tablet 40 mg  40 mg Oral  Daily Henreitta Leber, MD      . potassium chloride (K-DUR,KLOR-CON) CR tablet 10 mEq  10 mEq Oral Once Kirk Ruths, MD       Allergies  Allergen Reactions  . Ivp Dye [Iodinated Diagnostic Agents]    Active Problems:   Syncope  Blood pressure 126/60, pulse 70, temperature 97.8 F (36.6 C), temperature source Oral, resp. rate 18, height 5' (1.524 m), weight 53.615 kg (118 lb 3.2 oz), SpO2 100 %.  Subjective  Hx difficult but reveiwed h and p and discussed with hospitalist. Likely syncope yesterday, troponins minimally up but work up ok so far otherwise, hgb slightly down. Wife there who speaks some english.  No nvd no fcs noted Objective  nad Alert  Chest; clear  Heart; rrr no mrg abd; nontender nl bs Ext; no cce Assessment & Plan  - Presumed syncopal event post dialysis but did get cpr; follow up echo and  carotids, cbc in am, will need treadmill prior to dc  - Anemia; Borderline transfusion need, repeat in am. No clear bleeding issues - ESRD and hypokalemia; K times one orally  Signed Mckena Chern W. 11/06/2014

## 2014-11-07 LAB — CBC WITH DIFFERENTIAL/PLATELET
BASOS PCT: 1 %
Basophils Absolute: 0.1 10*3/uL (ref 0–0.1)
EOS PCT: 13 %
Eosinophils Absolute: 1 10*3/uL — ABNORMAL HIGH (ref 0–0.7)
HCT: 23.4 % — ABNORMAL LOW (ref 40.0–52.0)
HEMOGLOBIN: 7.6 g/dL — AB (ref 13.0–18.0)
LYMPHS ABS: 3.1 10*3/uL (ref 1.0–3.6)
Lymphocytes Relative: 37 %
MCH: 23.7 pg — ABNORMAL LOW (ref 26.0–34.0)
MCHC: 32.6 g/dL (ref 32.0–36.0)
MCV: 72.7 fL — AB (ref 80.0–100.0)
MONO ABS: 1.1 10*3/uL — AB (ref 0.2–1.0)
MONOS PCT: 13 %
Neutro Abs: 3 10*3/uL (ref 1.4–6.5)
Neutrophils Relative %: 36 %
Platelets: 319 10*3/uL (ref 150–440)
RBC: 3.22 MIL/uL — ABNORMAL LOW (ref 4.40–5.90)
RDW: 23.5 % — AB (ref 11.5–14.5)
WBC: 8.2 10*3/uL (ref 3.8–10.6)

## 2014-11-07 LAB — BASIC METABOLIC PANEL
Anion gap: 9 (ref 5–15)
BUN: 22 mg/dL — AB (ref 6–20)
CO2: 29 mmol/L (ref 22–32)
CREATININE: 7.72 mg/dL — AB (ref 0.61–1.24)
Calcium: 9.5 mg/dL (ref 8.9–10.3)
Chloride: 101 mmol/L (ref 101–111)
GFR calc Af Amer: 8 mL/min — ABNORMAL LOW (ref >60)
GFR calc non Af Amer: 7 mL/min — ABNORMAL LOW (ref >60)
GLUCOSE: 98 mg/dL (ref 65–99)
POTASSIUM: 3.7 mmol/L (ref 3.5–5.1)
Sodium: 139 mmol/L (ref 135–145)

## 2014-11-07 LAB — CORTISOL-AM, BLOOD: Cortisol - AM: 5.5 ug/dL — ABNORMAL LOW (ref 6.7–22.6)

## 2014-11-07 MED ORDER — LOSARTAN POTASSIUM 50 MG PO TABS
100.0000 mg | ORAL_TABLET | Freq: Every day | ORAL | Status: DC
  Administered 2014-11-07 – 2014-11-08 (×2): 100 mg via ORAL
  Filled 2014-11-07 (×2): qty 2

## 2014-11-07 MED ORDER — CITALOPRAM HYDROBROMIDE 10 MG/5ML PO SOLN
20.0000 mg | Freq: Every day | ORAL | Status: DC
  Administered 2014-11-07: 20 mg via ORAL
  Filled 2014-11-07 (×2): qty 10

## 2014-11-07 NOTE — Progress Notes (Signed)
Initial Nutrition Assessment  DOCUMENTATION CODES:     INTERVENTION:   (Medical Nutrition Supplements: Mighty shakes with each meals for additional nutrition)  Meals and Snacks: Cater to patient preferences   NUTRITION DIAGNOSIS:  Inadequate oral intake related to chronic illness as evidenced by percent weight loss.   GOAL:  Patient will meet greater than or equal to 90% of their needs    MONITOR:   (Energy Intake, Supplement Acceptance, I/O's, Digestive Profile, Anthropometrics)  REASON FOR ASSESSMENT:  Other (Comment) (RD Screen- Diet)    ASSESSMENT:  Reason For Admission: Sepsis PMHx: Past Medical History  Diagnosis Date  . Chronic disease anemia   . Hypertension   . High cholesterol   . Type II diabetes mellitus   . History of blood transfusion     "related to anemia"  . GERD (gastroesophageal reflux disease)   . History of stomach ulcers   . Gout   . ESRD (end stage renal disease) on dialysis     "[facility]; [place]; [name]" (09/29/2014)    Typical Fluid/ Food Intake: 100% of meals recorded per I/O Meal/ Snack Patterns: Unable to assess Supplements: none  Labs:  Electrolyte and Renal Profile:    Recent Labs Lab 11/05/14 1547 11/06/14 0423 11/07/14 0452  BUN 10 13 22*  CREATININE 4.42* 5.85* 7.72*  NA 139 140 139  K 3.5 3.3* 3.7  MG 1.5*  --   --   PHOS 1.9*  --   --    Protein Profile:  Recent Labs Lab 11/05/14 1547  ALBUMIN 3.1*   Meds: Lipitor, MVI  Physical Findings: pt up in chair at bedside;  Unable to complete Nutrition-Focused physical exam at this time.    Weight Changes: Reviewed recent hospital records; Weight history positive for significant weight loss x 6 mos (14%) and 1 months (8.3%).  Height:  Ht Readings from Last 1 Encounters:  11/05/14 5' (1.524 m)    Weight:  Wt Readings from Last 1 Encounters:  11/07/14 120 lb 11.2 oz (54.749 kg)    Ideal Body Weight:     Wt Readings from Last 10 Encounters:   11/07/14 120 lb 11.2 oz (54.749 kg)  10/01/14 128 lb 12 oz (58.4 kg)    BMI:  Body mass index is 23.57 kg/(m^2).  Estimated Nutritional Needs:  Kcal:  1539-1847 kcal/ day (BEE: 1283 x 1.2 AF x 1.0-1.2 IF)  Protein:  77-96 g Pro/day (1.2-1.5 g Pro/ kg)  Fluid:  1000 ml + UOP  Skin:  Reviewed, no issues  Diet Order:  Diet renal with fluid restriction Fluid restriction:: 1200 mL Fluid; Room service appropriate?: Yes; Fluid consistency:: Thin  EDUCATION NEEDS:  Education needs no appropriate at this time   Intake/Output Summary (Last 24 hours) at 11/07/14 1458 Last data filed at 11/07/14 1400  Gross per 24 hour  Intake    240 ml  Output      0 ml  Net    240 ml    Last BM:  5/21  Roda Shutters, RDN Pager: [phone] Office: [facility]

## 2014-11-07 NOTE — Progress Notes (Signed)
Pt. Removed peripheral IV from right A/C. No bleeding observed. Dressing placed over site. Requested house supervisor to come replace IV since pt. Is such a hard stick and only has right arm available R/T A/V fistula located in left arm. Will continue to monitor

## 2014-11-07 NOTE — Care Management Note (Signed)
I visited with patient and wife this morning.  Patient slept most of the time.  Wife has current concerns with transportation to and from dialysis while he is at [facility].  The cost of transporting to his dialysis treatments is not included in rehab covered charges and family is required to pay 60 per visit up front or furnish transportation themselves. One patient returns home his normal transportation to treat will resume and it will again be covered. Patient is active at [facility]  TTS Consent signed by wife and placed in chart. [place] Sizer  [phone]

## 2014-11-07 NOTE — Progress Notes (Signed)
Subjective:   Doing fair Wife at bedside Reports that he is back to baseline Ate some breakfast today   Objective:  Vital signs in last 24 hours:  Temp:  [98.1 F (36.7 C)-98.7 F (37.1 C)] 98.1 F (36.7 C) (05/23 1116) Pulse Rate:  [65-71] 65 (05/23 1116) Resp:  [18-25] 18 (05/23 1116) BP: (135-152)/(58-72) 135/62 mmHg (05/23 1116) SpO2:  [97 %-100 %] 97 % (05/23 1116) Weight:  [54.749 kg (120 lb 11.2 oz)] 54.749 kg (120 lb 11.2 oz) (05/23 0500)  Weight change: -2.75 kg (-6 lb 1 oz) Filed Weights   11/05/14 1436 11/06/14 0607 11/07/14 0500  Weight: 57.499 kg (126 lb 12.2 oz) 53.615 kg (118 lb 3.2 oz) 54.749 kg (120 lb 11.2 oz)    Intake/Output:       Physical Exam: General: NAD. Laying in bed  HEENT Moist oral mucus membranes  Neck supple  Pulm/lungs Clear ant/lat  CVS/Heart Regular, no rub  Abdomen:  Soft, non tender  Extremities: No peripheral edema  Neurologic: Conversive, depressed  Skin: No acute rashes  Access: Left arm AVG       Basic Metabolic Panel:  Recent Labs Lab 11/05/14 1547 11/06/14 0423 11/07/14 0452  NA 139 140 139  K 3.5 3.3* 3.7  CL 100* 102 101  CO2 30 30 29   GLUCOSE 103* 134* 98  BUN 10 13 22*  CREATININE 4.42* 5.85* 7.72*  CALCIUM 8.9 8.9 9.5  MG 1.5*  --   --   PHOS 1.9*  --   --      CBC:  Recent Labs Lab 11/05/14 1547 11/06/14 0423 11/07/14 0452  WBC 8.5 6.1 8.2  NEUTROABS 4.5  --  3.0  HGB 8.0* 7.5* 7.6*  HCT 25.3* 24.1* 23.4*  MCV 72.1* 73.2* 72.7*  PLT 346 313 319      Microbiology: Results for orders placed or performed during the hospital encounter of 11/05/14  Culture, blood (routine x 2)     Status: None (Preliminary result)   Collection Time: 11/05/14  3:20 PM  Result Value Ref Range Status   Specimen Description BLOOD  Final   Special Requests NONE  Final   Culture NO GROWTH < 24 HOURS  Final   Report Status PENDING  Incomplete  Culture, blood (routine x 2)     Status: None (Preliminary  result)   Collection Time: 11/05/14  3:47 PM  Result Value Ref Range Status   Specimen Description BLOOD  Final   Special Requests NONE  Final   Culture NO GROWTH < 24 HOURS  Final   Report Status PENDING  Incomplete    Coagulation Studies: No results for input(s): LABPROT, INR in the last 72 hours.  Urinalysis: No results for input(s): COLORURINE, LABSPEC, PHURINE, GLUCOSEU, HGBUR, BILIRUBINUR, KETONESUR, PROTEINUR, UROBILINOGEN, NITRITE, LEUKOCYTESUR in the last 72 hours.  Invalid input(s): APPERANCEUR    Imaging: Ct Head Wo Contrast  11/05/2014   CLINICAL DATA:  Patient became acutely unresponsive while undergoing hemodialysis earlier today. Hypothermia. Recent subdural hematoma.  EXAM: CT HEAD WITHOUT CONTRAST  TECHNIQUE: Contiguous axial images were obtained from the base of the skull through the vertex without intravenous contrast.  COMPARISON:  10/09/2014.  FINDINGS: Interval resolution of the left parafalcine subdural hematoma since the prior examination. Moderate cortical atrophy and moderate to severe deep atrophy. Mild changes of small vessel disease of the white matter diffusely. No mass lesion. No midline shift. No acute hemorrhage or hematoma. No extra-axial fluid collections. No evidence of  acute infarction.  Acute No skull fracture or other focal osseous abnormality involving the skull. Small mucous retention cyst or polyp in the right maxillary sinus. Remaining visualized paranasal sinuses, bilateral mastoid air cells and bilateral middle ear cavities well-aerated. Bilateral carotid siphon and left vertebral artery atherosclerosis.  IMPRESSION: 1. No acute intracranial abnormality. 2. Interval resolution of the left parafalcine subdural hematoma since the examination 1 month ago. 3. Moderate to severe cortical and deep atrophy and mild chronic microvascular ischemic changes of the white matter.   Electronically Signed   By: Evangeline Dakin M.D.   On: 11/05/2014 14:40   US  Carotid Bilateral  11/06/2014   CLINICAL DATA:  CVA and syncope.  EXAM: BILATERAL CAROTID DUPLEX ULTRASOUND  TECHNIQUE: Pearline Cables scale imaging, color Doppler and duplex ultrasound were performed of bilateral carotid and vertebral arteries in the neck.  COMPARISON:  None.  FINDINGS: Criteria: Quantification of carotid stenosis is based on velocity parameters that correlate the residual internal carotid diameter with NASCET-based stenosis levels, using the diameter of the distal internal carotid lumen as the denominator for stenosis measurement.  The following velocity measurements were obtained:  RIGHT  ICA:  69 cm/sec  CCA:  71 cm/sec  SYSTOLIC ICA/CCA RATIO:  1  DIASTOLIC ICA/CCA RATIO:  1.3  ECA:  91 cm/sec  LEFT  ICA:  69 cm/sec  CCA:  82 cm/sec  SYSTOLIC ICA/CCA RATIO:  0.8  DIASTOLIC ICA/CCA RATIO:  1.3  ECA:  89 cm/sec  RIGHT CAROTID ARTERY: Small amount of echogenic plaque at the right carotid bulb. The right carotid arteries are patent without significant stenosis. Normal waveforms and velocities in the right internal carotid artery.  RIGHT VERTEBRAL ARTERY: Antegrade flow and normal waveform in the right vertebral artery.  LEFT CAROTID ARTERY: Small amount of echogenic plaque in the distal left common carotid artery. Small amount of echogenic plaque at the left carotid bulb. Normal waveforms and velocities in the left internal carotid artery.  LEFT VERTEBRAL ARTERY: Antegrade flow and normal waveform in the left vertebral artery.  IMPRESSION: Mild atherosclerotic disease in the carotid arteries without significant stenosis. Estimated degree of stenosis in the internal carotid arteries is less than 50% bilaterally.  Patent vertebral arteries.   Electronically Signed   By: Markus Daft M.D.   On: 11/06/2014 09:55   Dg Chest Portable 1 View  11/05/2014   CLINICAL DATA:  Patient status post cardiac arrest swelling dialysis.  EXAM: PORTABLE CHEST - 1 VIEW  COMPARISON:  Chest radiograph 10/09/2014  FINDINGS:  Monitoring leads overlie the patient. Vascular graft within the left upper extremity. Stable enlarged cardiac and mediastinal contours. Minimal heterogeneous opacities left lung base. Right lung is well aerated. No pleural effusion or pneumothorax.  IMPRESSION: Minimal heterogeneous opacities left lung base likely represent atelectasis.  Cardiomegaly.   Electronically Signed   By: Lovey Newcomer M.D.   On: 11/05/2014 14:47     Medications:     . allopurinol  100 mg Oral Daily  . amLODipine  5 mg Oral Daily  . aspirin  81 mg Oral Daily  . atorvastatin  40 mg Oral q1800  . calcium acetate  1,334 mg Oral TID WC  . clopidogrel  75 mg Oral Q breakfast  . heparin  5,000 Units Subcutaneous 3 times per day  . metoprolol  50 mg Oral BID  . multivitamin  1 tablet Oral QHS  . pantoprazole  40 mg Oral Daily   acetaminophen **OR** acetaminophen, nitroGLYCERIN, ondansetron **OR** ondansetron (  ZOFRAN) IV  Assessment/ Plan:  64 y.o. 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 Back on peritoneal dialysis from 9/15 to 3/16, then transitioned to Hemodialysis. Admitted in [place] 2016 at [facility] for NSTEMI and got PCI to the LAD.  Readmitted on 10/09/14 to 10/22/14 for acute subdural hematoma.  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 ESRD: TTS schedule.- next dialysis tentatively scheduled for Tuesday  2. Syncope/Cardiac Arrest: no elevation in troponins. Agree with ischemic and syncope work up. Echocardiogram and carotid dopplers results reviewed  3. Hypertension: - currently on amlodipine and metoprolol. Add cozaar at night   4. Secondary Hyperparathyroidism:  - continue calcium acetate for binding  5. Anemia of chronic kidney disease: hemoglobin 7.6  6. Depression- start celexa   LOS:  [place] 5/23/201611:26  AM

## 2014-11-07 NOTE — Evaluation (Signed)
Physical Therapy Evaluation Patient Details Name: [name] MRN: [id number] DOB: 06-Jun-1951 Today's Date: 11/07/2014   History of Present Illness  64 yo male with onset of sepsis at HD, with elevated creatinine, elevated troponin, possible MI and Hx:  ESRD, NSTEMI, kidney transplant, SDH.  Clinical Impression  Pt is unable to do any mobility work without direct physical assist and control of balance.  He is left up in chair with alarm under him, planning to send to SNF as he is unsafe to do any work alone.  Cannot stand without significant help.  Will anticipate family education being needed before transition home.    Follow Up Recommendations SNF    Equipment Recommendations  None recommended by PT    Recommendations for Other Services       Precautions / Restrictions Precautions Precautions: Fall (telemetry) Restrictions Weight Bearing Restrictions: No      Mobility  Bed Mobility Overal bed mobility: Needs Assistance Bed Mobility: Supine to Sit     Supine to sit: Mod assist     General bed mobility comments: needed dense cues and assistance to maneuver to bedsie  Transfers Overall transfer level: Needs assistance Equipment used: 1 person hand held assist Transfers: Sit to/from [employer] Transfers Sit to Stand: Mod assist Stand pivot transfers: Mod assist       General transfer comment: Held UE and pivoted/stood at bed and chair with reminders for hand placement, controlled with BLE's but short sidesteps to chair  Ambulation/Gait             General Gait Details: transfers only  Stairs            Wheelchair Mobility    Modified Rankin (Stroke Patients Only)       Balance Overall balance assessment: Needs assistance Sitting-balance support: Feet supported Sitting balance-Leahy Scale: Poor   Postural control: Posterior lean Standing balance support: Single extremity supported Standing balance-Leahy Scale: Poor                                Pertinent Vitals/Pain Pain Assessment: No/denies pain    Home Living Family/patient expects to be discharged to:: Skilled nursing facility                      Prior Function Level of Independence: Independent with assistive device(s)               Hand Dominance        Extremity/Trunk Assessment   Upper Extremity Assessment: Generalized weakness           Lower Extremity Assessment: Generalized weakness      Cervical / Trunk Assessment: Normal  Communication   Communication: Expressive difficulties  Cognition Arousal/Alertness: Awake/alert Behavior During Therapy: WFL for tasks assessed/performed Overall Cognitive Status: Within Functional Limits for tasks assessed       Memory:  (difficult to assess due to communication)              General Comments      Exercises        Assessment/Plan    PT Assessment Patient needs continued PT services  PT Diagnosis Difficulty walking;Generalized weakness   PT Problem List Decreased strength;Decreased range of motion;Decreased activity tolerance;Decreased balance;Decreased mobility;Decreased coordination;Decreased cognition;Decreased knowledge of use of DME;Decreased safety awareness;Decreased knowledge of precautions;Cardiopulmonary status limiting activity  PT Treatment Interventions DME instruction;Gait training;Functional mobility training;Therapeutic activities;Therapeutic exercise;Balance training;Neuromuscular re-education;Patient/family education;Cognitive remediation  PT Goals (Current goals can be found in the Care Plan section) Acute Rehab PT Goals Patient Stated Goal: none stated PT Goal Formulation: With patient Time For Goal Achievement: 11/21/14 Potential to Achieve Goals: Good    Frequency Min 2X/week   Barriers to discharge Inaccessible home environment;Decreased caregiver support      Co-evaluation               End of Session   Activity  Tolerance: Patient limited by lethargy;Patient limited by fatigue Patient left: in chair;with call bell/phone within reach;with chair alarm set Nurse Communication: Mobility status    Functional Assessment Tool Used: clinical judgment Functional Limitation: Mobility: Walking and moving around Mobility: Walking and Moving Around Current Status [id number]): At least 60 percent but less than 80 percent impaired, limited or restricted Mobility: Walking and Moving Around Goal Status [phone]): At least 1 percent but less than 20 percent impaired, limited or restricted    Time: 1400-1426 PT Time Calculation (min) (ACUTE ONLY): 26 min   Charges:   PT Evaluation $Initial PT Evaluation Tier I: 1 Procedure PT Treatments $Therapeutic Activity: 8-22 mins   PT G Codes:   PT G-Codes **NOT FOR INPATIENT CLASS** Functional Assessment Tool Used: clinical judgment Functional Limitation: Mobility: Walking and moving around Mobility: Walking and Moving Around Current Status [id number]): At least 60 percent but less than 80 percent impaired, limited or restricted Mobility: Walking and Moving Around Goal Status [phone]): At least 1 percent but less than 20 percent impaired, limited or restricted    Ramond Dial 11/07/2014, 3:00 PM   Mee Hives, PT MS Acute Rehab Dept. Number: [facility] [id number] and [place] [phone]

## 2014-11-07 NOTE — Progress Notes (Signed)
Subjective: Wife at bedside. She is the principal informant. Mental status at baseline.    Objective: Vital signs in last 24 hours: Temp:  [98.1 F (36.7 C)-98.7 F (37.1 C)] 98.1 F (36.7 C) (05/23 1116) Pulse Rate:  [65-71] 65 (05/23 1116) Resp:  [18-25] 18 (05/23 1116) BP: (135-152)/(58-72) 135/62 mmHg (05/23 1116) SpO2:  [97 %-100 %] 97 % (05/23 1116) Weight:  [54.749 kg (120 lb 11.2 oz)] 54.749 kg (120 lb 11.2 oz) (05/23 0500) Weight change: -2.75 kg (-6 lb 1 oz) Last BM Date: 11/05/14  Intake/Output from previous day:   Intake/Output this shift: Total I/O In: 120 [P.O.:120] Out: -   Head: Normocephalic, without obvious abnormality, atraumatic Neck: no adenopathy, no carotid bruit, no JVD, supple, symmetrical, trachea midline and thyroid not enlarged, symmetric, no tenderness/mass/nodules Resp: clear to auscultation bilaterally Cardio: regular rate and rhythm, S1, S2 normal, no tachycardia Extremities: no lower leg edema Lymph nodes: no cervical lymphadenopathy.  Lab Results:  Recent Labs  11/06/14 0423 11/07/14 0452  WBC 6.1 8.2  HGB 7.5* 7.6*  HCT 24.1* 23.4*  PLT 313 319   BMET  Recent Labs  11/06/14 0423 11/07/14 0452  NA 140 139  K 3.3* 3.7  CL 102 101  CO2 30 29  GLUCOSE 134* 98  BUN 13 22*  CREATININE 5.85* 7.72*  CALCIUM 8.9 9.5    Studies/Results: Ct Head Wo Contrast  11/05/2014   CLINICAL DATA:  Patient became acutely unresponsive while undergoing hemodialysis earlier today. Hypothermia. Recent subdural hematoma.  EXAM: CT HEAD WITHOUT CONTRAST  TECHNIQUE: Contiguous axial images were obtained from the base of the skull through the vertex without intravenous contrast.  COMPARISON:  10/09/2014.  FINDINGS: Interval resolution of the left parafalcine subdural hematoma since the prior examination. Moderate cortical atrophy and moderate to severe deep atrophy. Mild changes of small vessel disease of the white matter diffusely. No mass lesion. No  midline shift. No acute hemorrhage or hematoma. No extra-axial fluid collections. No evidence of acute infarction.  Acute No skull fracture or other focal osseous abnormality involving the skull. Small mucous retention cyst or polyp in the right maxillary sinus. Remaining visualized paranasal sinuses, bilateral mastoid air cells and bilateral middle ear cavities well-aerated. Bilateral carotid siphon and left vertebral artery atherosclerosis.  IMPRESSION: 1. No acute intracranial abnormality. 2. Interval resolution of the left parafalcine subdural hematoma since the examination 1 month ago. 3. Moderate to severe cortical and deep atrophy and mild chronic microvascular ischemic changes of the white matter.   Electronically Signed   By: Evangeline Dakin M.D.   On: 11/05/2014 14:40   US Carotid Bilateral  11/06/2014   CLINICAL DATA:  CVA and syncope.  EXAM: BILATERAL CAROTID DUPLEX ULTRASOUND  TECHNIQUE: Pearline Cables scale imaging, color Doppler and duplex ultrasound were performed of bilateral carotid and vertebral arteries in the neck.  COMPARISON:  None.  FINDINGS: Criteria: Quantification of carotid stenosis is based on velocity parameters that correlate the residual internal carotid diameter with NASCET-based stenosis levels, using the diameter of the distal internal carotid lumen as the denominator for stenosis measurement.  The following velocity measurements were obtained:  RIGHT  ICA:  69 cm/sec  CCA:  71 cm/sec  SYSTOLIC ICA/CCA RATIO:  1  DIASTOLIC ICA/CCA RATIO:  1.3  ECA:  91 cm/sec  LEFT  ICA:  69 cm/sec  CCA:  82 cm/sec  SYSTOLIC ICA/CCA RATIO:  0.8  DIASTOLIC ICA/CCA RATIO:  1.3  ECA:  89 cm/sec  RIGHT CAROTID ARTERY:  Small amount of echogenic plaque at the right carotid bulb. The right carotid arteries are patent without significant stenosis. Normal waveforms and velocities in the right internal carotid artery.  RIGHT VERTEBRAL ARTERY: Antegrade flow and normal waveform in the right vertebral artery.  LEFT  CAROTID ARTERY: Small amount of echogenic plaque in the distal left common carotid artery. Small amount of echogenic plaque at the left carotid bulb. Normal waveforms and velocities in the left internal carotid artery.  LEFT VERTEBRAL ARTERY: Antegrade flow and normal waveform in the left vertebral artery.  IMPRESSION: Mild atherosclerotic disease in the carotid arteries without significant stenosis. Estimated degree of stenosis in the internal carotid arteries is less than 50% bilaterally.  Patent vertebral arteries.   Electronically Signed   By: Markus Daft M.D.   On: 11/06/2014 09:55   Dg Chest Portable 1 View  11/05/2014   CLINICAL DATA:  Patient status post cardiac arrest swelling dialysis.  EXAM: PORTABLE CHEST - 1 VIEW  COMPARISON:  Chest radiograph 10/09/2014  FINDINGS: Monitoring leads overlie the patient. Vascular graft within the left upper extremity. Stable enlarged cardiac and mediastinal contours. Minimal heterogeneous opacities left lung base. Right lung is well aerated. No pleural effusion or pneumothorax.  IMPRESSION: Minimal heterogeneous opacities left lung base likely represent atelectasis.  Cardiomegaly.   Electronically Signed   By: Lovey Newcomer M.D.   On: 11/05/2014 14:47    Medications: I have reviewed the patient's current medications.  Assessment/Plan: 64 y.o. Asian male with hypertension, end stage renal disease on hemodialysis. hyperlipidemia, gout, anemia of chronic kidney disease, diet controlled type 2 diabetes, coronary artery disease s/p PCI on 09/30/2014. Renal transplant in 2004. Transplant pyelonephritis requiring transplant nephrectomy in 04/2014. Patient was transferred from [facility] to [facility] on 10/09/2014 for acute subdural hematoma.  Patient became unresponsive after 2.5 hour of hemodialysis. Details are unclear. By the time patient got to ED, he was awake and communicative.  Syncope work up in progress. ECHO and carotid doppler noted. Myoview in AM. Cardiology consult.   ESRD on HD: closely followed by nephrology.  Anemia: stable, monitor H/H, transfuse prn. Patient was at [facility] after discharge from [facility]. He is not ambulating. PT evaluation.     Rabecca Birge 11/07/2014, 1:09 PM

## 2014-11-07 NOTE — Care Management (Signed)
Patient is being followed by dialysis coordinator.  Patient with ESRD.  He presents from [facility] and admitted from dialysis center after episode of syncope after 2.5 hours of treatment

## 2014-11-08 ENCOUNTER — Observation Stay: Payer: BLUE CROSS/BLUE SHIELD

## 2014-11-08 LAB — HEMOGLOBIN AND HEMATOCRIT, BLOOD
HCT: 23 % — ABNORMAL LOW (ref 40.0–52.0)
HEMOGLOBIN: 7.4 g/dL — AB (ref 13.0–18.0)

## 2014-11-08 LAB — GLUCOSE, CAPILLARY: Glucose-Capillary: 115 mg/dL — ABNORMAL HIGH (ref 65–99)

## 2014-11-08 MED ORDER — REGADENOSON 0.4 MG/5ML IV SOLN
0.4000 mg | Freq: Once | INTRAVENOUS | Status: AC
  Administered 2014-11-08: 0.4 mg via INTRAVENOUS
  Filled 2014-11-08: qty 5

## 2014-11-08 MED ORDER — COSYNTROPIN 0.25 MG IJ SOLR
0.2500 mg | Freq: Once | INTRAMUSCULAR | Status: AC
  Administered 2014-11-08: 0.25 mg via INTRAVENOUS
  Filled 2014-11-08: qty 0.25

## 2014-11-08 MED ORDER — COSYNTROPIN 0.25 MG IJ SOLR
0.2500 mg | Freq: Once | INTRAMUSCULAR | Status: DC
  Filled 2014-11-08: qty 0.25

## 2014-11-08 MED ORDER — TECHNETIUM TC 99M SESTAMIBI - CARDIOLITE
30.0000 | Freq: Once | INTRAVENOUS | Status: AC | PRN
  Administered 2014-11-08: 12:00:00 30.91 via INTRAVENOUS

## 2014-11-08 MED ORDER — CITALOPRAM HYDROBROMIDE 20 MG PO TABS
20.0000 mg | ORAL_TABLET | Freq: Every day | ORAL | Status: DC
  Administered 2014-11-08 – 2014-11-09 (×2): 20 mg via ORAL
  Filled 2014-11-08 (×2): qty 1

## 2014-11-08 MED ORDER — TECHNETIUM TC 99M SESTAMIBI - CARDIOLITE
10.0000 | Freq: Once | INTRAVENOUS | Status: AC | PRN
  Administered 2014-11-08: 10:00:00 13.3 via INTRAVENOUS

## 2014-11-08 MED ORDER — EPOETIN ALFA 10000 UNIT/ML IJ SOLN
10000.0000 [IU] | Freq: Once | INTRAMUSCULAR | Status: AC
  Administered 2014-11-08: 10000 [IU] via INTRAVENOUS

## 2014-11-08 NOTE — Progress Notes (Signed)
Post hd tx 

## 2014-11-08 NOTE — Clinical Social Work Note (Signed)
Clinical Social Work Assessment  Patient Details  Name: [name] MRN: [id number] Date of Birth: 1950-12-26  Date of referral:  11/08/14               Reason for consult:  Facility Placement (pt is from [facility] )                Permission sought to share information with:  Family Supports, Customer service manager (pt was sleeping during assessment and would not wake when called.  CSW spoke to pt's wife.  ) Permission granted to share information::   (pt was not able to wake to give permission.  CSW spoke to pt's wife.)  Name::        Agency::     Relationship::     Contact Information:     Housing/Transportation Living arrangements for the past 2 months:  [place] of Information:  Spouse, Facility Patient Interpreter Needed:  None Criminal Activity/Legal Involvement Pertinent to Current Situation/Hospitalization:  No - Comment as needed Significant Relationships:  Spouse Lives with:  Facility Resident Do you feel safe going back to the place where you live?    Need for family participation in patient care:  Yes (Comment)  Care giving concerns:  Pt's wife had concerns about transportation to dialysis.  Pt currently get transported to dialysis by [facility] but there is a charge incurred for every transfer.  CSW notified Dialysis Liason about transportation issue.  Kim notified.      Social Worker assessment / plan:  CSW was able to speak to pts wife.  She was in agreement with dc to [facility].  She thanked CSW for her assistance.  Employment status:  Disabled (Comment on whether or not currently receiving Disability) Insurance information:  Information systems manager, Other (Comment Required) ([employer] PT Recommendations:  [place] / Referral to community resources:     Patient/Family's Response to care:  CSW was able to speak to pts wife.  She was in agreement with dc to  [facility].  She thanked CSW for her assistance.  Employment status:  Disabled (Comment on whether or not currently receiving Disability)   Patient/Family's Understanding of and Emotional Response to Diagnosis, Current Treatment, and Prognosis:  CSW was able to speak to pts wife.  She was in agreement with dc to [facility].  She thanked CSW for her assistance.  Employment status:  Disabled (Comment on whether or not currently receiving Disability)   Emotional Assessment Appearance:  Appears stated age Attitude/Demeanor/Rapport:    Affect (typically observed):    Orientation:    Alcohol / Substance use:    Psych involvement (Current and /or in the community):  No (Comment)  Discharge Needs  Concerns to be addressed:  Discharge Planning Concerns Readmission within the last 30 days:  No Current discharge risk:  Dependent with Mobility Barriers to Discharge:      Mathews Argyle, LCSW 11/08/2014, 11:15 AM

## 2014-11-08 NOTE — Progress Notes (Signed)
HD tx start 

## 2014-11-08 NOTE — Progress Notes (Signed)
Subjective: Patient was seen in Nuclear medicine, awaiting Myoview. Mental status at baseline.    Objective: Vital signs in last 24 hours: Temp:  [97.8 F (36.6 C)-98.9 F (37.2 C)] 97.8 F (36.6 C) (05/24 0929) Pulse Rate:  [68-73] 72 (05/24 0929) Resp:  [18-20] 20 (05/24 0929) BP: (145-155)/(64-67) 155/67 mmHg (05/24 0929) SpO2:  [99 %-100 %] 100 % (05/24 0929) Weight:  [54.795 kg (120 lb 12.8 oz)] 54.795 kg (120 lb 12.8 oz) (05/24 0436) Weight change: 0.045 kg (1.6 oz) Last BM Date: 11/08/14  Intake/Output from previous day: 05/23 0701 - 05/24 0700 In: 240 [P.O.:240] Out: -  Intake/Output this shift: Total I/O In: -  Out: 1 [Stool:1]  Head: Normocephalic, without obvious abnormality, atraumatic Neck: no adenopathy, no carotid bruit, no JVD, supple, symmetrical, trachea midline and thyroid not enlarged, symmetric, no tenderness/mass/nodules Resp: clear to auscultation bilaterally Cardio: regular rate and rhythm, S1, S2 normal, no tachycardia Extremities: no lower leg edema Lymph nodes: no cervical lymphadenopathy.  Lab Results:  Recent Labs  11/06/14 0423 11/07/14 0452  WBC 6.1 8.2  HGB 7.5* 7.6*  HCT 24.1* 23.4*  PLT 313 319   BMET  Recent Labs  11/06/14 0423 11/07/14 0452  NA 140 139  K 3.3* 3.7  CL 102 101  CO2 30 29  GLUCOSE 134* 98  BUN 13 22*  CREATININE 5.85* 7.72*  CALCIUM 8.9 9.5    Studies/Results: No results found.  Medications: I have reviewed the patient's current medications.  Assessment/Plan: 64 y.o. Asian male with hypertension, end stage renal disease on hemodialysis. hyperlipidemia, gout, anemia of chronic kidney disease, diet controlled type 2 diabetes, coronary artery disease s/p PCI on 09/30/2014. Renal transplant in 2004. Transplant pyelonephritis requiring transplant nephrectomy in 04/2014. Patient was transferred from [facility] to [facility] on 10/09/2014 for acute subdural hematoma.  Patient became unresponsive after 2.5 hour of  hemodialysis. Details are unclear. By the time patient got to ED, he was awake and communicative.  Syncope work up in progress. ECHO and carotid doppler noted. Myoview today. Cardiology consult awaited.  ESRD on HD: closely followed by nephrology.  Anemia: stable, monitor H/H, transfuse prn. Patient was at [facility] after discharge from [facility]. He is not ambulating, continue PT.     [name] 11/08/2014, 1:28 PM

## 2014-11-08 NOTE — Progress Notes (Addendum)
Subjective:   Pt seen during dialysis, tolerating well.   Objective:  Vital signs in last 24 hours:  Temp:  [97.8 F (36.6 C)-98.9 F (37.2 C)] 97.8 F (36.6 C) (05/24 1500) Pulse Rate:  [68-78] 73 (05/24 1600) Resp:  [18-20] 18 (05/24 1600) BP: (143-155)/(57-74) 148/71 mmHg (05/24 1600) SpO2:  [98 %-100 %] 98 % (05/24 1500) Weight:  [54.2 kg (119 lb 7.8 oz)-54.795 kg (120 lb 12.8 oz)] 54.2 kg (119 lb 7.8 oz) (05/24 1500)  Weight change: 0.045 kg (1.6 oz) Filed Weights   11/07/14 0500 11/08/14 0436 11/08/14 1500  Weight: 54.749 kg (120 lb 11.2 oz) 54.795 kg (120 lb 12.8 oz) 54.2 kg (119 lb 7.8 oz)    Intake/Output: I/O last 3 completed shifts: In: 240 [P.O.:240] Out: -      Physical Exam: General: NAD. Laying in bed  HEENT Moist oral mucus membranes  Neck supple  Pulm/lungs Clear ant/lat  CVS/Heart Regular, no rub  Abdomen:  Soft, non tender  Extremities: No peripheral edema  Neurologic: Conversive, depressed  Skin: No acute rashes  Access: Left arm AVG       Basic Metabolic Panel:  Recent Labs Lab 11/05/14 1547 11/06/14 0423 11/07/14 0452  NA 139 140 139  K 3.5 3.3* 3.7  CL 100* 102 101  CO2 30 30 29   GLUCOSE 103* 134* 98  BUN 10 13 22*  CREATININE 4.42* 5.85* 7.72*  CALCIUM 8.9 8.9 9.5  MG 1.5*  --   --   PHOS 1.9*  --   --      CBC:  Recent Labs Lab 11/05/14 1547 11/06/14 0423 11/07/14 0452 11/08/14 1531  WBC 8.5 6.1 8.2  --   NEUTROABS 4.5  --  3.0  --   HGB 8.0* 7.5* 7.6* 7.4*  HCT 25.3* 24.1* 23.4* 23.0*  MCV 72.1* 73.2* 72.7*  --   PLT 346 313 319  --       Microbiology: Results for orders placed or performed during the hospital encounter of 11/05/14  Culture, blood (routine x 2)     Status: None (Preliminary result)   Collection Time: 11/05/14  3:20 PM  Result Value Ref Range Status   Specimen Description BLOOD  Final   Special Requests NONE  Final   Culture NO GROWTH 3 DAYS  Final   Report Status PENDING  Incomplete   Culture, blood (routine x 2)     Status: None (Preliminary result)   Collection Time: 11/05/14  3:47 PM  Result Value Ref Range Status   Specimen Description BLOOD  Final   Special Requests NONE  Final   Culture NO GROWTH 3 DAYS  Final   Report Status PENDING  Incomplete    Coagulation Studies: No results for input(s): LABPROT, INR in the last 72 hours.  Urinalysis: No results for input(s): COLORURINE, LABSPEC, PHURINE, GLUCOSEU, HGBUR, BILIRUBINUR, KETONESUR, PROTEINUR, UROBILINOGEN, NITRITE, LEUKOCYTESUR in the last 72 hours.  Invalid input(s): APPERANCEUR    Imaging: Nm Myocar Multi W/spect W/wall Motion / Ef  11/08/2014    There was no ST segment deviation noted during stress.  No T wave inversion was noted during stress.      Medications:     . allopurinol  100 mg Oral Daily  . amLODipine  5 mg Oral Daily  . aspirin  81 mg Oral Daily  . atorvastatin  40 mg Oral q1800  . calcium acetate  1,334 mg Oral TID WC  . citalopram  20 mg Oral  Daily  . clopidogrel  75 mg Oral Q breakfast  . heparin  5,000 Units Subcutaneous 3 times per day  . losartan  100 mg Oral QHS  . metoprolol  50 mg Oral BID  . multivitamin  1 tablet Oral QHS  . pantoprazole  40 mg Oral Daily   acetaminophen **OR** acetaminophen, nitroGLYCERIN, ondansetron **OR** ondansetron (ZOFRAN) IV  Assessment/ Plan:  64 y.o. 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 Back on peritoneal dialysis from 9/15 to 3/16, then transitioned to Hemodialysis. Admitted in [place] 2016 at [facility] for NSTEMI and got PCI to the LAD.  Readmitted on 10/09/14 to 10/22/14 for acute subdural hematoma.  Transplant nephrectomy 11/15  Currently at [facility]   1. ESRD: TTS schedule.- Patient seen during dialysis Tolerating well   2. Syncope/Cardiac Arrest: no elevation  in troponins. Agree with ischemic and syncope work up. Echocardiogram and carotid dopplers results reviewed  3. Hypertension: - currently on amlodipine and metoprolol. Add cozaar at night   4. Secondary Hyperparathyroidism:  - continue calcium acetate for binding  5. Anemia of chronic kidney disease: hemoglobin 7.4  6. Depression- start celexa  7. Low Cortisol Level. ACTH Stimulation Test ordered while on dialysis   LOS:  Tylee Yum 5/24/20164:12 PM

## 2014-11-08 NOTE — Progress Notes (Signed)
Hd tx complete

## 2014-11-08 NOTE — Progress Notes (Signed)
Pre-hd tx 

## 2014-11-09 LAB — NM MYOCAR MULTI W/SPECT W/WALL MOTION / EF
CHL CUP NUCLEAR SDS: 0
CHL CUP NUCLEAR SSS: 6
CHL CUP STRESS STAGE 1 HR: 74 {beats}/min
CHL CUP STRESS STAGE 3 GRADE: 0 %
CHL CUP STRESS STAGE 4 SPEED: 0 mph
CHL CUP STRESS STAGE 5 GRADE: 0 %
CHL CUP STRESS STAGE 5 HR: 78 {beats}/min
CHL CUP STRESS STAGE 6 DBP: 48 mmHg
CHL CUP STRESS STAGE 6 SBP: 118 mmHg
Estimated workload: 1 METS
LV dias vol: 142 mL
LVSYSVOL: 73 mL
Peak HR: 81 {beats}/min
Percent of predicted max HR: 51 %
Rest HR: 74 {beats}/min
SRS: 12
Stage 2 Grade: 0 %
Stage 2 HR: 71 {beats}/min
Stage 2 Speed: 0 mph
Stage 3 HR: 71 {beats}/min
Stage 3 Speed: 0 mph
Stage 4 Grade: 0 %
Stage 4 HR: 81 {beats}/min
Stage 5 Speed: 0 mph
Stage 6 Grade: 0 %
Stage 6 HR: 85 {beats}/min
Stage 6 Speed: 0 mph
TID: 0.86

## 2014-11-09 MED ORDER — AMLODIPINE BESYLATE 5 MG PO TABS: 5.0000 mg | ORAL_TABLET | Freq: Every day | ORAL | Status: DC

## 2014-11-09 MED ORDER — HEPARIN SODIUM (PORCINE) 5000 UNIT/ML IJ SOLN: 5000.0000 [IU] | Freq: Three times a day (TID) | INTRAMUSCULAR | Status: DC

## 2014-11-09 MED ORDER — LOSARTAN POTASSIUM 100 MG PO TABS: 100.0000 mg | ORAL_TABLET | Freq: Every day | ORAL | Status: DC

## 2014-11-09 MED ORDER — CITALOPRAM HYDROBROMIDE 20 MG PO TABS: 20.0000 mg | ORAL_TABLET | Freq: Every day | ORAL | Status: DC

## 2014-11-09 MED ORDER — ATORVASTATIN CALCIUM 40 MG PO TABS: 40.0000 mg | ORAL_TABLET | Freq: Every day | ORAL | Status: DC

## 2014-11-09 NOTE — Care Management (Signed)
Patient is for discharge back to skilled nursing today

## 2014-11-09 NOTE — Discharge Instructions (Signed)
Syncope Syncope is a medical term for fainting or passing out. This means you lose consciousness and drop to the ground. People are generally unconscious for less than 5 minutes. You may have some muscle twitches for up to 15 seconds before waking up and returning to normal. Syncope occurs more often in older adults, but it can happen to anyone. While most causes of syncope are not dangerous, syncope can be a sign of a serious medical problem. It is important to seek medical care.  CAUSES  Syncope is caused by a sudden drop in blood flow to the brain. The specific cause is often not determined. Factors that can bring on syncope include:  Taking medicines that lower blood pressure.  Sudden changes in posture, such as standing up quickly.  Taking more medicine than prescribed.  Standing in one place for too long.  Seizure disorders.  Dehydration and excessive exposure to heat.  Low blood sugar (hypoglycemia).  Straining to have a bowel movement.  Heart disease, irregular heartbeat, or other circulatory problems.  Fear, emotional distress, seeing blood, or severe pain. SYMPTOMS  Right before fainting, you may:  Feel dizzy or light-headed.  Feel nauseous.  See all white or all black in your field of vision.  Have cold, clammy skin. DIAGNOSIS  Your health care provider will ask about your symptoms, perform a physical exam, and perform an electrocardiogram (ECG) to record the electrical activity of your heart. Your health care provider may also perform other heart or blood tests to determine the cause of your syncope which may include:  Transthoracic echocardiogram (TTE). During echocardiography, sound waves are used to evaluate how blood flows through your heart.  Transesophageal echocardiogram (TEE).  Cardiac monitoring. This allows your health care provider to monitor your heart rate and rhythm in real time.  Holter monitor. This is a portable device that records your  heartbeat and can help diagnose heart arrhythmias. It allows your health care provider to track your heart activity for several days, if needed.  Stress tests by exercise or by giving medicine that makes the heart beat faster. TREATMENT  In most cases, no treatment is needed. Depending on the cause of your syncope, your health care provider may recommend changing or stopping some of your medicines. HOME CARE INSTRUCTIONS  Have someone stay with you until you feel stable.  Do not drive, use machinery, or play sports until your health care provider says it is okay.  Keep all follow-up appointments as directed by your health care provider.  Lie down right away if you start feeling like you might faint. Breathe deeply and steadily. Wait until all the symptoms have passed.  Drink enough fluids to keep your urine clear or pale yellow.  If you are taking blood pressure or heart medicine, get up slowly and take several minutes to sit and then stand. This can reduce dizziness. SEEK IMMEDIATE MEDICAL CARE IF:   You have a severe headache.  You have unusual pain in the chest, abdomen, or back.  You are bleeding from your mouth or rectum, or you have black or tarry stool.  You have an irregular or very fast heartbeat.  You have pain with breathing.  You have repeated fainting or seizure-like jerking during an episode.  You faint when sitting or lying down.  You have confusion.  You have trouble walking.  You have severe weakness.  You have vision problems. If you fainted, call your local emergency services (911 in [place] Do not  drive yourself to the hospital.  MAKE SURE YOU:  Understand these instructions.  Will watch your condition.  Will get help right away if you are not doing well or get worse. Document Released: 06/03/2005 Document Revised: 06/08/2013 Document Reviewed: 08/02/2011 [facility] Patient Information 2015 [place], [place]. This information is not intended to replace  advice given to you by your health care provider. Make sure you discuss any questions you have with your health care provider.   [facility] PT AIDE.  [facility]  [phone]

## 2014-11-09 NOTE — Progress Notes (Signed)
Subjective:   Laying in bed No acute distress Did not talk Normal breathing effort   Objective:  Vital signs in last 24 hours:  Temp:  [97.5 F (36.4 C)-97.8 F (36.6 C)] 97.6 F (36.4 C) (05/24 1946) Pulse Rate:  [66-81] 66 (05/25 0745) Resp:  [16-20] 20 (05/25 0745) BP: (126-148)/(57-85) 147/61 mmHg (05/25 0745) SpO2:  [98 %-100 %] 99 % (05/25 0745) Weight:  [53.1 kg (117 lb 1 oz)-54.2 kg (119 lb 7.8 oz)] 53.434 kg (117 lb 12.8 oz) (05/25 [id number])  Weight change: -0.594 kg (-1 lb 5 oz) Filed Weights   11/08/14 1500 11/08/14 1857 11/09/14 0608  Weight: 54.2 kg (119 lb 7.8 oz) 53.1 kg (117 lb 1 oz) 53.434 kg (117 lb 12.8 oz)    Intake/Output: I/O last 3 completed shifts: In: -  Out: 1001 [Other:1000; Stool:1]     Physical Exam: General: NAD. Laying in bed  HEENT Moist oral mucus membranes  Neck supple  Pulm/lungs Clear ant/lat  CVS/Heart Regular, no rub  Abdomen:  Soft, non tender  Extremities: No peripheral edema  Neurologic: sleeping, depressed  Skin: No acute rashes  Access: Left arm AVG       Basic Metabolic Panel:  Recent Labs Lab 11/05/14 1547 11/06/14 0423 11/07/14 0452  NA 139 140 139  K 3.5 3.3* 3.7  CL 100* 102 101  CO2 30 30 29   GLUCOSE 103* 134* 98  BUN 10 13 22*  CREATININE 4.42* 5.85* 7.72*  CALCIUM 8.9 8.9 9.5  MG 1.5*  --   --   PHOS 1.9*  --   --      CBC:  Recent Labs Lab 11/05/14 1547 11/06/14 0423 11/07/14 0452 11/08/14 1531  WBC 8.5 6.1 8.2  --   NEUTROABS 4.5  --  3.0  --   HGB 8.0* 7.5* 7.6* 7.4*  HCT 25.3* 24.1* 23.4* 23.0*  MCV 72.1* 73.2* 72.7*  --   PLT 346 313 319  --       Microbiology: Results for orders placed or performed during the hospital encounter of 11/05/14  Culture, blood (routine x 2)     Status: None (Preliminary result)   Collection Time: 11/05/14  3:20 PM  Result Value Ref Range Status   Specimen Description BLOOD  Final   Special Requests NONE  Final   Culture NO GROWTH 3 DAYS  Final    Report Status PENDING  Incomplete  Culture, blood (routine x 2)     Status: None (Preliminary result)   Collection Time: 11/05/14  3:47 PM  Result Value Ref Range Status   Specimen Description BLOOD  Final   Special Requests NONE  Final   Culture NO GROWTH 3 DAYS  Final   Report Status PENDING  Incomplete    Coagulation Studies: No results for input(s): LABPROT, INR in the last 72 hours.  Urinalysis: No results for input(s): COLORURINE, LABSPEC, PHURINE, GLUCOSEU, HGBUR, BILIRUBINUR, KETONESUR, PROTEINUR, UROBILINOGEN, NITRITE, LEUKOCYTESUR in the last 72 hours.  Invalid input(s): APPERANCEUR    Imaging: Nm Myocar Multi W/spect W/wall Motion / Ef  11/08/2014    There was no ST segment deviation noted during stress.  No T wave inversion was noted during stress.      Medications:     . allopurinol  100 mg Oral Daily  . amLODipine  5 mg Oral Daily  . aspirin  81 mg Oral Daily  . atorvastatin  40 mg Oral q1800  . calcium acetate  1,334 mg Oral  TID WC  . citalopram  20 mg Oral Daily  . clopidogrel  75 mg Oral Q breakfast  . heparin  5,000 Units Subcutaneous 3 times per day  . losartan  100 mg Oral QHS  . metoprolol  50 mg Oral BID  . multivitamin  1 tablet Oral QHS  . pantoprazole  40 mg Oral Daily   acetaminophen **OR** acetaminophen, nitroGLYCERIN, ondansetron **OR** ondansetron (ZOFRAN) IV  Assessment/ Plan:  64 y.o. 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 Back on peritoneal dialysis from 9/15 to 3/16, then transitioned to Hemodialysis. Admitted in [place] 2016 at [facility] for NSTEMI and got PCI to the LAD.  Readmitted on 10/09/14 to 10/22/14 for acute subdural hematoma.  Transplant nephrectomy 11/15  Currently at [facility]   1. ESRD: TTS schedule.- Next HD treatment tomorrow  2. Syncope/Cardiac  Arrest:  Agree with ischemic and syncope work up. Echocardiogram and carotid dopplers results reviewed - stress test results are pending  3. Hypertension: - currently on amlodipine and metoprolol. Added cozaar at night  - better control  4. Secondary Hyperparathyroidism:  - continue calcium acetate for binding  5. Anemia of chronic kidney disease: hemoglobin 7.4 - EPO with dialysis  6. Depression- started celexa  7. Low Cortisol Level. ACTH Stimulation Test ordered while on dialysis. Results are pending   LOS:  Karel Mowers 5/25/201611:13 AM

## 2014-11-09 NOTE — Discharge Summary (Signed)
Physician Discharge Summary  Patient ID: [name] MRN: [id number] DOB/AGE: 63-Jun-1952 63 y.o.  Admit date: 11/05/2014 Discharge date: 11/09/2014  Admission Diagnoses: Syncope  Discharge Diagnoses:  Active Problems:   Syncope   Discharged Condition: stable  Hospital Course: 64 y.o. Asian male with hypertension, end stage renal disease on hemodialysis. hyperlipidemia, gout, anemia of chronic kidney disease, diet controlled type 2 diabetes, coronary artery disease s/p PCI on 09/30/2014. Renal transplant in 2004. Transplant pyelonephritis requiring transplant nephrectomy in 04/2014. Patient was transferred from [facility] to [facility] on 10/09/2014 for acute subdural hematoma.  Patient was admitted for syncope. He became unresponsive after 2.5 hour of hemodialysis on 11/05/2014. Details are unclear. By the time patient got to ED, he was awake and communicative.  Syncope work up including ECHO, carotid doppler and Myoview were negative. Patient was evaluated by Dr. Clayborn Bigness and cleared for discharge. I discussed the Myoview findings and his clinical impression with Dr. Clayborn Bigness during rounds today. Patient to continue aspirin, Plavix, statin, beta-blocker and s/l nitroglycerine. ESRD on HD, patient was closely followed by nephrology during the stay. Anemia: H/H remained stable, patient received Epogen per nephrology. He was started on Celexa for depression. Patient does not walk much at baseline. He was evaluated by physical therapy. Shall discharge to [facility] care, where he came from.   Consults: cardiology and nephrology  Discharge Exam: Blood pressure 140/62, pulse 67, temperature 98.8 F (37.1 C), temperature source Oral, resp. rate 16, height 5' (1.524 m), weight 53.434 kg (117 lb 12.8 oz), SpO2 100 %. Head: Normocephalic, without obvious abnormality, atraumatic Neck: no adenopathy, no carotid bruit, no JVD, supple, symmetrical, trachea midline and thyroid not enlarged, symmetric, no  tenderness/mass/nodules Resp: clear to auscultation bilaterally Cardio: regular rate and rhythm, no tachycardia GI: soft, non-tender; bowel sounds normal; no masses,  no organomegaly Skin: Skin color, texture, turgor normal. No rashes or lesions  Disposition: [place]      Medication List    ASK your doctor about these medications        acetaminophen 325 MG tablet  Commonly known as:  TYLENOL  Take 650 mg by mouth every 6 (six) hours as needed for mild pain or moderate pain.     allopurinol 100 MG tablet  Commonly known as:  ZYLOPRIM  Take 100 mg by mouth daily.     aspirin 81 MG EC tablet  Take 81 mg by mouth daily.     BAYER CONTOUR TEST test strip  Generic drug:  glucose blood     calcium acetate 667 MG capsule  Commonly known as:  PHOSLO  Take 1,334 mg by mouth 3 (three) times daily with meals.     clopidogrel 75 MG tablet  Commonly known as:  PLAVIX  Take 1 tablet (75 mg total) by mouth daily with breakfast.     levETIRAcetam 500 MG tablet  Commonly known as:  KEPPRA  Take 500 mg by mouth every 12 (twelve) hours.     Melatonin 3 MG Tabs  Take 3 mg by mouth at bedtime.     metoprolol 50 MG tablet  Commonly known as:  LOPRESSOR  Take 50 mg by mouth every 12 (twelve) hours.     multivitamin Tabs tablet  Take 1 tablet by mouth at bedtime.     nitroGLYCERIN 0.4 MG SL tablet  Commonly known as:  NITROSTAT  Place 1 tablet (0.4 mg total) under the tongue every 5 (five) minutes x 3 doses as needed for chest  pain.     pantoprazole 40 MG tablet  Commonly known as:  PROTONIX  Take 40 mg by mouth daily.     simvastatin 10 MG tablet  Commonly known as:  ZOCOR  Take 10 mg by mouth daily.         Signed: Dymond Gutt 11/09/2014, 1:03 PM

## 2014-11-09 NOTE — Care Management (Signed)
Informed that patient's insurance is not going to approve  Patient returning to skilled nursing.  Spoke with  [facility] and informed that [place] was patient's primary insurance and medicare is secondary.  Leisure centre manager [facility] insurance verification).  Spoke with patient's daughter and she states that there was a family meeting at  [facility]  And home health services were discussed.  Patient's wife and daughter agreeable to home health through [facility].   Daughter requests hospital bed and shower  Chair.  Discussed that insurance would not cover shower chair.  Patient has a walker.   Requested script for hospital bed from attending.  Will Ouida Sills will pick up the script from MD office.  Family will transport home   [place]  Patient has CAD with recent intervention along with ESRD  which causes shortness of breath.  This requires the head of the bed to be positioned in waysnot feasible with a normal bed. Head must be elevated at least 30 degrees or patient will experience dyspnea.  A wedge will not elevate the head of the bed enough to alleviate sx.

## 2014-11-09 NOTE — Progress Notes (Signed)
Physical Therapy Treatment Patient Details Name: [name] MRN: [id number] DOB: 24-Apr-1951 Today's Date: 11/09/2014    History of Present Illness 64 yo male with onset of sepsis at HD, with elevated creatinine, elevated troponin, possible MI and Hx:  ESRD, NSTEMI, kidney transplant, SDH.    PT Comments    Pt with very limited verbal communication during session; flat affect. Difficulty following instructions with bed mobility and seated balance. Pt retropulsive and has difficulty keeping feet on the floor during edge of bed sitting. Once standing, pt with improved functional ability to take several small steps to chair. Difficulty again following instructions to reposition backward into the chair. Attempted to instruct in exercises without pt active response or ability to demonstrate understanding. Pt does respond to several yes or no questions appropriately answering yes to being comfortable seated in chair and no to needing anything else at this time.   Follow Up Recommendations  SNF     Equipment Recommendations       Recommendations for Other Services       Precautions / Restrictions Precautions Precautions: Fall Restrictions Weight Bearing Restrictions: No    Mobility  Bed Mobility Overal bed mobility: Needs Assistance Bed Mobility: Supine to Sit     Supine to sit: Mod assist     General bed mobility comments:  (Needs extensive cueing for technique and forward lean)  Transfers Overall transfer level: Needs assistance Equipment used: 1 person hand held assist Transfers: Sit to/from Stand Sit to Stand: Mod assist            Ambulation/Gait Ambulation/Gait assistance: Min assist Ambulation Distance (Feet): 5 Feet Assistive device: 1 person hand held assist (several small steps to chair) Gait Pattern/deviations: Shuffle Gait velocity: reduced Gait velocity interpretation: <1.8 ft/sec, indicative of risk for recurrent falls     Stairs             Wheelchair Mobility    Modified Rankin (Stroke Patients Only)       Balance Overall balance assessment: Needs assistance Sitting-balance support: Bilateral upper extremity supported;Feet supported Sitting balance-Leahy Scale: Poor Sitting balance - Comments:  (Requires constant cues and Mod A to maintain; retropulsive) Postural control:  (Retropulsive) Standing balance support: Single extremity supported Standing balance-Leahy Scale: Poor                      Cognition Arousal/Alertness: Awake/alert Behavior During Therapy: Flat affect Overall Cognitive Status: Difficult to assess                      Exercises      General Comments        Pertinent Vitals/Pain Pain Assessment: No/denies pain    Home Living                      Prior Function            PT Goals (current goals can now be found in the care plan section) Progress towards PT goals: Progressing toward goals (slowly)    Frequency  Min 2X/week    PT Plan Current plan remains appropriate    Co-evaluation             End of Session Equipment Utilized During Treatment: Gait belt Activity Tolerance: Patient limited by fatigue (Difficult to assess due to pt's lack of communication) Patient left: in chair;with call bell/phone within reach;with chair alarm set     Time: [id number] PT Time Calculation (  min) (ACUTE ONLY): 16 min  Charges:  $Therapeutic Activity: 8-22 mins                    G Codes:      Charlaine Dalton 11/09/2014, 11:38 AM

## 2014-11-09 NOTE — Consult Note (Signed)
Reason for Consult: syncope/  Shortness of breath/ acute coronary syndrome Referring Physician:  Dr. Malen Gauze is an 64 y.o. male.  HPI:  Patient is a 64 year old male end-stage renal disease on hemodialysis hypertension hyperlipidemia Diabetes chronic anemia and gout presents to the hospital afte.r syncopal event patient is been treated of on dialysis reportedly had her oncologist and had CPR there is no rhythm strips for pain is not clear whether the patient truly had cardiac arrest was recorded by EMS awake and alert patient appears to be confused. The patient has language barrier sh history is very difficult cardiology consultation was recommended because of possible syncopal event .  Past Medical History  Diagnosis Date  . Chronic disease anemia   . Hypertension   . High cholesterol   . Type II diabetes mellitus   . History of blood transfusion     "related to anemia"  . GERD (gastroesophageal reflux disease)   . History of stomach ulcers   . Gout   . ESRD (end stage renal disease) on dialysis     "[place]; [place]; [name]" (09/29/2014)    Past Surgical History  Procedure Laterality Date  . Kidney transplant Right 2004  . Peritoneal catheter insertion  02/2014  . Peritoneal catheter removal  08/2014  . Arteriovenous graft placement Left ~ 1996  . Thrombectomy / arteriovenous graft revision  2015  . Nephrectomy transplanted organ  2015  . Cardiac catheterization  09/29/2014    "[place]"  . Percutaneous coronary stent intervention (pci-s) N/A 09/30/2014    Procedure: PERCUTANEOUS CORONARY STENT INTERVENTION (PCI-S);  Surgeon: [name], MD;  Location: [facility] CATH LAB;  Service: Cardiovascular;  Laterality: N/A;    History reviewed. No pertinent family history.  Social History:  reports that he has quit smoking. His smoking use included Cigarettes. He has never used smokeless tobacco. He reports that he does not drink alcohol or use illicit drugs.  Allergies:  Allergies   Allergen Reactions  . Ivp Dye [Iodinated Diagnostic Agents]     Medications:  Prior to Admission:  Prescriptions prior to admission  Medication Sig Dispense Refill Last Dose  . acetaminophen (TYLENOL) 325 MG tablet Take 650 mg by mouth every 6 (six) hours as needed for mild pain or moderate pain.   unknown  . allopurinol (ZYLOPRIM) 100 MG tablet Take 100 mg by mouth daily.  10 unknown  . aspirin 81 MG EC tablet Take 81 mg by mouth daily.  10 unknown  . BAYER CONTOUR TEST test strip   1 unknown  . calcium acetate (PHOSLO) 667 MG capsule Take 1,334 mg by mouth 3 (three) times daily with meals.   unknown  . clopidogrel (PLAVIX) 75 MG tablet Take 1 tablet (75 mg total) by mouth daily with breakfast. 30 tablet 11 unknown  . levETIRAcetam (KEPPRA) 500 MG tablet Take 500 mg by mouth every 12 (twelve) hours.   unknown  . Melatonin 3 MG TABS Take 3 mg by mouth at bedtime.   unkn own  . metoprolol (LOPRESSOR) 50 MG tablet Take 50 mg by mouth every 12 (twelve) hours.   10 unknown  . multivitamin (RENA-VIT) TABS tablet Take 1 tablet by mouth at bedtime. (Patient taking differently: Take 1 tablet by mouth daily. ) 30 tablet 3 unknown  . nitroGLYCERIN (NITROSTAT) 0.4 MG SL tablet Place 1 tablet (0.4 mg total) under the tongue every 5 (five) minutes x 3 doses as needed for chest pain. 25 tablet 12 unknown  . pantoprazole (  PROTONIX) 40 MG tablet Take 40 mg by mouth daily.   unknown  . simvastatin (ZOCOR) 10 MG tablet Take 10 mg by mouth daily.   unknown    Results for orders placed or performed during the hospital encounter of 11/05/14 (from the past 48 hour(s))  Glucose, capillary     Status: Abnormal   Collection Time: 11/08/14  4:18 AM  Result Value Ref Range   Glucose-Capillary 115 (H) 65 - 99 mg/dL  Hemoglobin and hematocrit, blood     Status: Abnormal   Collection Time: 11/08/14  3:31 PM  Result Value Ref Range   Hemoglobin 7.4 (L) 13.0 - 18.0 g/dL   HCT 23.0 (L) 40.0 - 52.0 %    Nm Myocar  Multi W/spect W/wall Motion / Ef  11/09/2014    There was no ST segment deviation noted during stress.  No T wave inversion was noted during stress.  The study is normal.  This is a low risk study.  The left ventricular ejection fraction is mildly decreased (45-54%).  Blood pressure demonstrated a blunted response to exercise.     Review of Systems  Constitutional: Positive for diaphoresis.  HENT: Negative.   Eyes: Negative.   Respiratory: Negative.   Cardiovascular: Positive for chest pain and orthopnea.  Gastrointestinal: Negative.   Genitourinary: Negative.   Musculoskeletal: Negative.   Skin: Negative.   Neurological: Positive for dizziness, loss of consciousness and weakness.  Endo/Heme/Allergies: Negative.   Psychiatric/Behavioral: Negative.    Blood pressure 140/62, pulse 67, temperature 98.8 F (37.1 C), temperature source Oral, resp. rate 16, height 5' (1.524 m), weight 53.434 kg (117 lb 12.8 oz), SpO2 100 %. Physical Exam  Nursing note and vitals reviewed. Constitutional: He is oriented to person, place, and time. He appears well-developed and well-nourished.  HENT:  Head: Normocephalic.  Eyes: Pupils are equal, round, and reactive to light.  Neck: Normal range of motion. Neck supple. JVD present.  Cardiovascular: Normal rate and normal heart sounds.   Respiratory: Effort normal and breath sounds normal.  GI: Soft. Bowel sounds are normal.  Musculoskeletal: Normal range of motion. He exhibits edema.  Neurological: He is alert and oriented to person, place, and time.  Skin: Skin is warm and dry.  Psychiatric: He has a normal mood and affect.    Left extremity with dialysis shunt  Assessment/Plan:  syncope  end-stage renal disease  diabetes  shortness of breath  acute coronary syndrome  hypertension  weakness  GERD  Gout  hyperlipidemia  history of subdural hematoma . PLAN  continue telemetry  to rule out myocardial infarctions  follow-up EKG  agree  with echocardiogram  doubtful, significant true cardiac event  agree with Myoview scan for ischemia  I do not recommend cardiac catheterization at this point  continue Plavix and aspirin for anticoagulation  agree with Lipitor for hyperlipidemia therapy  continue Protonix therapy for reflux symptoms  hypertension control with amlodipine losartan metoprolol  recommend conservative cardiac  Input  hopefully discharge home soon if the patient remains stable  Nikolette Reindl D. 11/09/2014, 5:02 PM

## 2014-11-09 NOTE — Clinical Social Work Note (Signed)
CSW spoke to [facility], and the insurance was denied for pt to continue SNF at that facility.  CSW notified RNCM to set up home health with pt and his family.  CSW signing off.

## 2014-11-09 NOTE — Progress Notes (Signed)
Discharge instructions explained to pts daughter and son who are both fluent in English/ family declined interperter/ iv and tele removed/ transported off unit via wheelchair.

## 2014-11-10 LAB — CULTURE, BLOOD (ROUTINE X 2)
CULTURE: NO GROWTH
Culture: NO GROWTH

## 2014-12-12 ENCOUNTER — Emergency Department: Payer: BLUE CROSS/BLUE SHIELD

## 2014-12-12 ENCOUNTER — Inpatient Hospital Stay
Admission: EM | Admit: 2014-12-12 | Discharge: 2014-12-16 | DRG: 070 | Disposition: A | Discharge status: Home Health | Payer: BLUE CROSS/BLUE SHIELD | Attending: Internal Medicine | Admitting: Internal Medicine

## 2014-12-12 ENCOUNTER — Other Ambulatory Visit: Payer: Self-pay

## 2014-12-12 ENCOUNTER — Encounter: Payer: Self-pay | Admitting: Emergency Medicine

## 2014-12-12 DIAGNOSIS — Z833 Family history of diabetes mellitus: Secondary | ICD-10-CM | POA: Diagnosis not present

## 2014-12-12 DIAGNOSIS — D631 Anemia in chronic kidney disease: Secondary | ICD-10-CM | POA: Diagnosis present

## 2014-12-12 DIAGNOSIS — E785 Hyperlipidemia, unspecified: Secondary | ICD-10-CM | POA: Diagnosis present

## 2014-12-12 DIAGNOSIS — F329 Major depressive disorder, single episode, unspecified: Secondary | ICD-10-CM | POA: Diagnosis present

## 2014-12-12 DIAGNOSIS — M109 Gout, unspecified: Secondary | ICD-10-CM | POA: Diagnosis present

## 2014-12-12 DIAGNOSIS — Z940 Kidney transplant status: Secondary | ICD-10-CM

## 2014-12-12 DIAGNOSIS — G9340 Encephalopathy, unspecified: Secondary | ICD-10-CM

## 2014-12-12 DIAGNOSIS — E875 Hyperkalemia: Secondary | ICD-10-CM | POA: Diagnosis present

## 2014-12-12 DIAGNOSIS — K219 Gastro-esophageal reflux disease without esophagitis: Secondary | ICD-10-CM | POA: Diagnosis present

## 2014-12-12 DIAGNOSIS — E780 Pure hypercholesterolemia: Secondary | ICD-10-CM | POA: Diagnosis present

## 2014-12-12 DIAGNOSIS — Z794 Long term (current) use of insulin: Secondary | ICD-10-CM | POA: Diagnosis not present

## 2014-12-12 DIAGNOSIS — Z7982 Long term (current) use of aspirin: Secondary | ICD-10-CM | POA: Diagnosis not present

## 2014-12-12 DIAGNOSIS — Z79899 Other long term (current) drug therapy: Secondary | ICD-10-CM

## 2014-12-12 DIAGNOSIS — R404 Transient alteration of awareness: Secondary | ICD-10-CM | POA: Diagnosis not present

## 2014-12-12 DIAGNOSIS — D649 Anemia, unspecified: Secondary | ICD-10-CM | POA: Diagnosis present

## 2014-12-12 DIAGNOSIS — Z7902 Long term (current) use of antithrombotics/antiplatelets: Secondary | ICD-10-CM

## 2014-12-12 DIAGNOSIS — Z992 Dependence on renal dialysis: Secondary | ICD-10-CM | POA: Diagnosis not present

## 2014-12-12 DIAGNOSIS — N186 End stage renal disease: Secondary | ICD-10-CM | POA: Diagnosis present

## 2014-12-12 DIAGNOSIS — R112 Nausea with vomiting, unspecified: Secondary | ICD-10-CM | POA: Diagnosis present

## 2014-12-12 DIAGNOSIS — Z87891 Personal history of nicotine dependence: Secondary | ICD-10-CM

## 2014-12-12 DIAGNOSIS — Z66 Do not resuscitate: Secondary | ICD-10-CM | POA: Diagnosis present

## 2014-12-12 DIAGNOSIS — E119 Type 2 diabetes mellitus without complications: Secondary | ICD-10-CM | POA: Diagnosis present

## 2014-12-12 DIAGNOSIS — G9341 Metabolic encephalopathy: Principal | ICD-10-CM | POA: Diagnosis present

## 2014-12-12 DIAGNOSIS — I120 Hypertensive chronic kidney disease with stage 5 chronic kidney disease or end stage renal disease: Secondary | ICD-10-CM | POA: Diagnosis present

## 2014-12-12 DIAGNOSIS — I5032 Chronic diastolic (congestive) heart failure: Secondary | ICD-10-CM | POA: Diagnosis present

## 2014-12-12 DIAGNOSIS — R4182 Altered mental status, unspecified: Secondary | ICD-10-CM

## 2014-12-12 HISTORY — DX: Chronic diastolic (congestive) heart failure: I50.32

## 2014-12-12 LAB — CBC WITH DIFFERENTIAL/PLATELET
Basophils Absolute: 0 10*3/uL (ref 0–0.1)
Basophils Relative: 0 %
EOS PCT: 6 %
Eosinophils Absolute: 0.5 10*3/uL (ref 0–0.7)
HEMATOCRIT: 28.6 % — AB (ref 40.0–52.0)
HEMOGLOBIN: 9 g/dL — AB (ref 13.0–18.0)
LYMPHS ABS: 1.9 10*3/uL (ref 1.0–3.6)
LYMPHS PCT: 22 %
MCH: 23.7 pg — ABNORMAL LOW (ref 26.0–34.0)
MCHC: 31.6 g/dL — ABNORMAL LOW (ref 32.0–36.0)
MCV: 75.1 fL — AB (ref 80.0–100.0)
MONO ABS: 0.5 10*3/uL (ref 0.2–1.0)
MONOS PCT: 5 %
Neutro Abs: 5.8 10*3/uL (ref 1.4–6.5)
Neutrophils Relative %: 67 %
Platelets: 220 10*3/uL (ref 150–440)
RBC: 3.81 MIL/uL — ABNORMAL LOW (ref 4.40–5.90)
RDW: 21.8 % — AB (ref 11.5–14.5)
WBC: 8.8 10*3/uL (ref 3.8–10.6)

## 2014-12-12 LAB — COMPREHENSIVE METABOLIC PANEL
ALBUMIN: 3.4 g/dL — AB (ref 3.5–5.0)
ALT: 9 U/L — ABNORMAL LOW (ref 17–63)
AST: 29 U/L (ref 15–41)
Alkaline Phosphatase: 96 U/L (ref 38–126)
Anion gap: 12 (ref 5–15)
BILIRUBIN TOTAL: 0.9 mg/dL (ref 0.3–1.2)
BUN: 17 mg/dL (ref 6–20)
CALCIUM: 11.3 mg/dL — AB (ref 8.9–10.3)
CHLORIDE: 97 mmol/L — AB (ref 101–111)
CO2: 32 mmol/L (ref 22–32)
Creatinine, Ser: 7.93 mg/dL — ABNORMAL HIGH (ref 0.61–1.24)
GFR calc Af Amer: 7 mL/min — ABNORMAL LOW (ref >60)
GFR calc non Af Amer: 6 mL/min — ABNORMAL LOW (ref >60)
Glucose, Bld: 177 mg/dL — ABNORMAL HIGH (ref 65–99)
Potassium: 3.5 mmol/L (ref 3.5–5.1)
Sodium: 141 mmol/L (ref 135–145)
Total Protein: 7 g/dL (ref 6.5–8.1)

## 2014-12-12 LAB — LIPASE, BLOOD: Lipase: 42 U/L (ref 22–51)

## 2014-12-12 LAB — TROPONIN I: TROPONIN I: 0.04 ng/mL — AB (ref <0.031)

## 2014-12-12 MED ORDER — VANCOMYCIN HCL IN DEXTROSE 1-5 GM/200ML-% IV SOLN
1000.0000 mg | Freq: Once | INTRAVENOUS | Status: AC
  Administered 2014-12-12: 1000 mg via INTRAVENOUS

## 2014-12-12 MED ORDER — CEFTRIAXONE SODIUM IN DEXTROSE 20 MG/ML IV SOLN
INTRAVENOUS | Status: AC
  Administered 2014-12-12: 1 g via INTRAVENOUS
  Filled 2014-12-12: qty 50

## 2014-12-12 MED ORDER — SODIUM CHLORIDE 0.9 % IJ SOLN
3.0000 mL | Freq: Two times a day (BID) | INTRAMUSCULAR | Status: DC
  Administered 2014-12-13 – 2014-12-15 (×4): 3 mL via INTRAVENOUS

## 2014-12-12 MED ORDER — ACETAMINOPHEN 325 MG PO TABS: 650.0000 mg | ORAL_TABLET | Freq: Four times a day (QID) | ORAL | Status: DC | PRN

## 2014-12-12 MED ORDER — LEVETIRACETAM 500 MG/5ML IV SOLN
500.0000 mg | Freq: Two times a day (BID) | INTRAVENOUS | Status: DC
  Administered 2014-12-13 – 2014-12-16 (×8): 500 mg via INTRAVENOUS
  Filled 2014-12-12 (×10): qty 5

## 2014-12-12 MED ORDER — CEFTRIAXONE SODIUM IN DEXTROSE 20 MG/ML IV SOLN
1.0000 g | INTRAVENOUS | Status: DC
  Administered 2014-12-12 – 2014-12-15 (×4): 1 g via INTRAVENOUS
  Filled 2014-12-12 (×4): qty 50

## 2014-12-12 MED ORDER — LABETALOL HCL 5 MG/ML IV SOLN
10.0000 mg | INTRAVENOUS | Status: DC | PRN
  Filled 2014-12-12: qty 4

## 2014-12-12 MED ORDER — ACETAMINOPHEN 650 MG RE SUPP
650.0000 mg | Freq: Four times a day (QID) | RECTAL | Status: DC | PRN
  Administered 2014-12-13: 650 mg via RECTAL
  Filled 2014-12-12: qty 1

## 2014-12-12 MED ORDER — HEPARIN SODIUM (PORCINE) 5000 UNIT/ML IJ SOLN
5000.0000 [IU] | Freq: Three times a day (TID) | INTRAMUSCULAR | Status: DC
  Administered 2014-12-13 – 2014-12-16 (×8): 5000 [IU] via SUBCUTANEOUS
  Filled 2014-12-12 (×9): qty 1

## 2014-12-12 MED ORDER — ASPIRIN 300 MG RE SUPP
RECTAL | Status: AC
Start: 2014-12-12 — End: 2014-12-12
  Administered 2014-12-12: 300 mg via RECTAL
  Filled 2014-12-12: qty 1

## 2014-12-12 MED ORDER — VANCOMYCIN HCL IN DEXTROSE 1-5 GM/200ML-% IV SOLN
INTRAVENOUS | Status: AC
  Administered 2014-12-12: 1000 mg via INTRAVENOUS
  Filled 2014-12-12: qty 200

## 2014-12-12 MED ORDER — INSULIN ASPART 100 UNIT/ML ~~LOC~~ SOLN
0.0000 [IU] | Freq: Every day | SUBCUTANEOUS | Status: DC
Start: 2014-12-12 — End: 2014-12-16

## 2014-12-12 MED ORDER — ONDANSETRON HCL 4 MG/2ML IJ SOLN
4.0000 mg | Freq: Once | INTRAMUSCULAR | Status: AC
  Administered 2014-12-12: 4 mg via INTRAVENOUS

## 2014-12-12 MED ORDER — ONDANSETRON HCL 4 MG/2ML IJ SOLN
INTRAMUSCULAR | Status: AC
Start: 2014-12-12 — End: 2014-12-12
  Administered 2014-12-12: 4 mg via INTRAVENOUS
  Filled 2014-12-12: qty 2

## 2014-12-12 MED ORDER — ASPIRIN 300 MG RE SUPP
300.0000 mg | Freq: Every day | RECTAL | Status: DC
  Administered 2014-12-13 – 2014-12-16 (×4): 300 mg via RECTAL
  Filled 2014-12-12 (×4): qty 1

## 2014-12-12 MED ORDER — ASPIRIN 300 MG RE SUPP
300.0000 mg | Freq: Once | RECTAL | Status: AC
  Administered 2014-12-12: 300 mg via RECTAL

## 2014-12-12 MED ORDER — INSULIN ASPART 100 UNIT/ML ~~LOC~~ SOLN
0.0000 [IU] | Freq: Three times a day (TID) | SUBCUTANEOUS | Status: DC
  Administered 2014-12-16: 1 [IU] via SUBCUTANEOUS
  Filled 2014-12-12: qty 1

## 2014-12-12 MED ORDER — LORAZEPAM 2 MG/ML IJ SOLN
0.5000 mg | Freq: Once | INTRAMUSCULAR | Status: AC
  Administered 2014-12-13: 0.5 mg via INTRAVENOUS
  Filled 2014-12-12: qty 1

## 2014-12-12 NOTE — ED Notes (Signed)
Pt arrived via EMS from home with family. Per EMS family called because patient has been hallucinating and not acting himself.  He vomited x3 en route to ED with EMS. Patient is alert but not responding to questions.  Patient is non-english speaking, but family reported to EMS that he has not been responding to family either.

## 2014-12-12 NOTE — ED Notes (Signed)
1 set of blood cultures drawn and sent to lab

## 2014-12-12 NOTE — ED Notes (Signed)
Wife arrived to room.  She states patient started vomiting around 1300 today after lunch.  Patient has had confusion since November when they took his kidney out. Wife states that he has not missed any dialysis and is on Tuesday, Thursday, Saturday to HD.

## 2014-12-12 NOTE — H&P (Signed)
[facility] [place] NAME: [name]    MR#:  [id number]  DATE OF BIRTH:  02-Oct-1950  DATE OF ADMISSION:  12/12/2014  PRIMARY CARE PHYSICIAN: [name], MD   REQUESTING/REFERRING PHYSICIAN: Suezanne Jacquet, M.D.  CHIEF COMPLAINT:   Chief Complaint  Patient presents with  . Altered Mental Status    HISTORY OF PRESENT ILLNESS:  [name]  is a 64 y.o. male with a known history of end-stage renal disease on dialysis, hypertension, diabetes, hyperlipidemia and heart disease. Patient is unable to give any history. As per family at the bedside, at about 12 noon he ate some lunch and was given medications. About 30 minutes later he vomited about 3 times and became more confused. They witnessed some jumping in his legs and arms. The family member went to work and then had to come back home because her daughter called her for him to be brought to the hospital.  As per the family, he has been declining over the past few months. Normally just lies in the bed. He does not communicate much.  PAST MEDICAL HISTORY:   Past Medical History  Diagnosis Date  . Chronic disease anemia   . Hypertension   . High cholesterol   . Type II diabetes mellitus   . History of blood transfusion     "related to anemia"  . GERD (gastroesophageal reflux disease)   . History of stomach ulcers   . Gout   . ESRD (end stage renal disease) on dialysis     "[facility] [place]; [name]" (09/29/2014)  . Chronic diastolic congestive heart failure     PAST SURGICAL HISTORY:   Past Surgical History  Procedure Laterality Date  . Kidney transplant Right 2004  . Peritoneal catheter insertion  02/2014  . Peritoneal catheter removal  08/2014  . Arteriovenous graft placement Left ~ 1996  . Thrombectomy / arteriovenous graft revision  2015  . Nephrectomy transplanted organ  2015  . Cardiac catheterization  09/29/2014    "[place]"  . Percutaneous coronary stent  intervention (pci-s) N/A 09/30/2014    Procedure: PERCUTANEOUS CORONARY STENT INTERVENTION (PCI-S);  Surgeon: [name], MD;  Location: [facility] CATH LAB;  Service: Cardiovascular;  Laterality: N/A;    SOCIAL HISTORY:   History  Substance Use Topics  . Smoking status: Former Smoker    Types: Cigarettes  . Smokeless tobacco: Never Used     Comment: "quit smoking cigarettes in the 1980's"  . Alcohol Use: No    FAMILY HISTORY:   Family History  Problem Relation Age of Onset  . Hypertension Mother   . Stroke Mother   . Hypertension Father   . Diabetes Mellitus II Father   . Asthma Father     DRUG ALLERGIES:   Allergies  Allergen Reactions  . Ivp Dye [Iodinated Diagnostic Agents]     REVIEW OF SYSTEMS:  Patient is unable to provide a review of systems secondary to acute encephalopathy.  MEDICATIONS AT HOME:   Prior to Admission medications   Medication Sig Start Date End Date Taking? Authorizing Provider  allopurinol (ZYLOPRIM) 100 MG tablet Take 100 mg by mouth daily. 09/23/14  Yes Historical Provider, MD  amLODipine (NORVASC) 5 MG tablet Take 1 tablet (5 mg total) by mouth daily. 11/09/14  Yes [name], MD  aspirin 81 MG EC tablet Take 81 mg by mouth daily. 09/23/14  Yes Historical Provider, MD  atorvastatin (LIPITOR) 40 MG tablet Take 1 tablet (40  mg total) by mouth daily at 6 PM. 11/09/14  Yes [name], MD  calcium acetate (PHOSLO) 667 MG capsule Take 1,334 mg by mouth 3 (three) times daily with meals. 05/04/14  Yes Historical Provider, MD  citalopram (CELEXA) 20 MG tablet Take 1 tablet (20 mg total) by mouth daily. 11/09/14  Yes [name], MD  clopidogrel (PLAVIX) 75 MG tablet Take 1 tablet (75 mg total) by mouth daily with breakfast. 10/02/14  Yes [name], MD  levETIRAcetam (KEPPRA) 500 MG tablet Take 500 mg by mouth every 12 (twelve) hours.   Yes Historical Provider, MD  losartan (COZAAR) 100 MG tablet Take 1 tablet (100 mg total) by mouth daily. 11/09/14   Yes [name], MD  Melatonin 3 MG TABS Take 3 mg by mouth at bedtime.   Yes Historical Provider, MD  metoprolol (LOPRESSOR) 50 MG tablet Take 50 mg by mouth every 12 (twelve) hours.  09/23/14  Yes Historical Provider, MD  multivitamin (RENA-VIT) TABS tablet Take 1 tablet by mouth at bedtime. Patient taking differently: Take 1 tablet by mouth daily.  10/02/14  Yes [name], MD  pantoprazole (PROTONIX) 40 MG tablet Take 40 mg by mouth daily. 01/17/14  Yes Historical Provider, MD  acetaminophen (TYLENOL) 325 MG tablet Take 650 mg by mouth every 6 (six) hours as needed for mild pain or moderate pain.    Historical Provider, MD  heparin 5000 UNIT/ML injection Inject 1 mL (5,000 Units total) into the skin every 8 (eight) hours. Patient not taking: Reported on 12/12/2014 11/09/14   [name], MD  nitroGLYCERIN (NITROSTAT) 0.4 MG SL tablet Place 1 tablet (0.4 mg total) under the tongue every 5 (five) minutes x 3 doses as needed for chest pain. 10/02/14   [name], MD      VITAL SIGNS:  Blood pressure 183/85, pulse 79, temperature 98.3 F (36.8 C), temperature source Oral, resp. rate 24, weight 57.698 kg (127 lb 3.2 oz), SpO2 95 %.  PHYSICAL EXAMINATION:  GENERAL:  64 y.o.-year-old patient lying in the bed with no acute distress.  EYES: Pupils equal, round, dilated, sluggish response to light. No scleral icterus. Unable to test Extraocular muscles.  HEENT: Head atraumatic, normocephalic. Patient refused to open his mouth. Nasal mucosa no erythema.  NECK:  Supple, no jugular venous distention. No thyroid enlargement, no tenderness.  LUNGS: Normal breath sounds bilaterally, no wheezing, rales,rhonchi or crepitation. No use of accessory muscles of respiration.  CARDIOVASCULAR: S1, S2 normal. 4/6 systolic murmur, no rubs, or gallops.  ABDOMEN: Soft, nontender, nondistended. Bowel sounds present. No organomegaly or mass.  EXTREMITIES: Trace edema, no cyanosis, or clubbing.  NEUROLOGIC: Patient  opens eyes to commands. Does not talk to me. Does not follow commands so it is difficult to test cranial nerves. He was moving his legs and right arm on his own.  PSYCHIATRIC: The patient is alert.  SKIN: No rash, lesion, or ulcer.   LABORATORY PANEL:   CBC  Recent Labs Lab 12/12/14 1911  WBC 8.8  HGB 9.0*  HCT 28.6*  PLT 220   Chemistries   Recent Labs Lab 12/12/14 1911  NA 141  K 3.5  CL 97*  CO2 32  GLUCOSE 177*  BUN 17  CREATININE 7.93*  CALCIUM 11.3*  AST 29  ALT 9*  ALKPHOS 96  BILITOT 0.9   Cardiac Enzymes  Recent Labs Lab 12/12/14 1911  TROPONINI 0.04*    RADIOLOGY:  Ct Abdomen Pelvis Wo Contrast  12/12/2014   CLINICAL DATA:  Altered mental status.  Vomiting.  EXAM: CT ABDOMEN AND PELVIS WITHOUT CONTRAST  TECHNIQUE: Multidetector CT imaging of the abdomen and pelvis was performed following the standard protocol without IV contrast.  COMPARISON:  10/07/2014  FINDINGS: Lower chest:  Small bilateral pleural effusions are noted.  Hepatobiliary: No suspicious liver abnormality. No gallstones. No biliary dilatation.  Pancreas: Negative  Spleen: Negative  Adrenals/Urinary Tract: The adrenal glands are normal. Bilateral renal calculi are noted. The largest stone is in the inferior pole of the right kidney measuring 5 mm, image 28/series 2. The kidneys are atrophic and there are small bilateral renal cysts. These are incompletely characterized without IV contrast. The urinary bladder appears collapsed.  Stomach/Bowel: The stomach is normal. The small bowel loops have a normal course and caliber. The appendix is visualized and appears normal. There is numerous colonic diverticula. The transverse colon through the distal descending colon appears collapsed. Sigmoid colon and rectum are unremarkable.  Vascular/Lymphatic: Aortic atherosclerosis noted. No aneurysm. No upper abdominal adenopathy. No pelvic or inguinal adenopathy.  Reproductive: The prostate gland and seminal  vesicles are unremarkable.  Other: Again noted are postoperative changes within the right lower quadrant of the abdomen, image 50 of series 2.  Likely related to prior renal graft. Left inguinal hernia is noted. Fluid is identified within the distal left inguinal canal. New from previous exam.  Musculoskeletal: Changes of renal osteodystrophy noted.  IMPRESSION: 1. There are no acute findings identified within the abdomen or pelvis. No evidence for bowel obstruction to explain patient's vomiting. 2. Aortic atherosclerosis. 3. Bilateral end-stage kidneys. 4. Colonic diverticulosis. 5. Small bilateral pleural effusions.   Electronically Signed   By: Kerby Moors M.D.   On: 12/12/2014 19:01   Dg Chest 1 View  12/12/2014   CLINICAL DATA:  Altered mental status and vomiting.  EXAM: CHEST  1 VIEW  COMPARISON:  11/05/2014  FINDINGS: Mild atelectasis present at both lung bases. There is no evidence of pulmonary edema, consolidation, pneumothorax, nodule or pleural fluid. The aorta is ectatic. The heart size is at the upper limits of normal.  IMPRESSION: Bibasilar atelectasis.  No acute findings.   Electronically Signed   By: Aletta Edouard M.D.   On: 12/12/2014 19:00   Ct Head Wo Contrast  12/12/2014   CLINICAL DATA:  Altered mental status today.  EXAM: CT HEAD WITHOUT CONTRAST  TECHNIQUE: Contiguous axial images were obtained from the base of the skull through the vertex without intravenous contrast.  COMPARISON:  Head CT scan 11/05/2014.  FINDINGS: There is no evidence of acute intracranial abnormality including hemorrhage, infarct, mass lesion, mass effect, midline shift or abnormal extra-axial fluid collection cortical atrophy and chronic microvascular ischemic change are identified. There is no hydrocephalus or pneumocephalus. The calvarium is intact.  IMPRESSION: No acute abnormality.   Electronically Signed   By: Inge Rise M.D.   On: 12/12/2014 18:57   Ct Cervical Spine Wo Contrast  12/12/2014    CLINICAL DATA:  Status post fall 1 week ago. Altered mental status. Turning the head to the right. Initial encounter.  EXAM: CT CERVICAL SPINE WITHOUT CONTRAST  TECHNIQUE: Multidetector CT imaging of the cervical spine was performed without intravenous contrast. Multiplanar CT image reconstructions were also generated.  COMPARISON:  None.  FINDINGS: No fracture or malalignment is identified. Loss of disc space height is seen at C5-6. Imaged paraspinous structures are unremarkable. Lung apices are clear.  IMPRESSION: No acute abnormality.   Electronically Signed   By: Marcello Moores  Dalessio M.D.   On: 12/12/2014 19:56    EKG:   Normal sinus rhythm 79 bpm LVH, QTC 497  IMPRESSION AND PLAN:   1. Acute encephalopathy. So far unclear etiology. Family states he gets this way when he has an infection. I will order blood cultures 2 and give empiric vancomycin and Rocephin for right now. With this shaking episode, it could be a seizure. The patient is on Keppra at home. I will give IV Keppra here and order an EEG for the morning. This could also be an acute stroke. I will order an MRI of the brain. I will give rectal aspirin. At this point I don't think he'll be able to take any oral medications. I will get a speech pathology evaluation in the a.m. 2. Accelerated hypertension. If this is an acute stroke I don't mind his blood pressure being on the higher side. I will give when necessary medication. 3. End-stage renal disease on hemodialysis- Will consult nephrology for dialysis. 4. Type 2 diabetes- I will monitor on sliding scale at this point. 5. Hyperlipidemia unspecified- hold statin for right now. 6. Gastroesophageal reflux disease without esophagitis- Will hold PPI. 7. History of depression- need to hold Celexa at this point.  All the records are reviewed and case discussed with ED provider. Management plans discussed with the patient, family and they are in agreement.  CODE STATUS: DO NOT  RESUSCITATE  TOTAL TIME TAKING CARE OF THIS PATIENT: 55 minutes.    Loletha Grayer M.D on 12/12/2014 at 8:53 PM  Between 7am to 6pm - Pager - [phone]  After 6pm call admission pager [facility]  Office  [phone]  CC: Primary care physician; [name], MD

## 2014-12-12 NOTE — ED Notes (Signed)
Dr. Clearnce Hasten notified of elevated troponin 0.04. No new orders.

## 2014-12-12 NOTE — ED Notes (Signed)
Per EMS, they tried x 2 to stick patient with IV access and unsuccessful.  Per family the last time he was in hospital they used [employer] to obtain IV access.  Dr. Darlyn Chamber.  Given 4mg  Zofran en route to ED

## 2014-12-12 NOTE — ED Provider Notes (Addendum)
[facility] Emergency Department Provider Note  ____________________________________________  Time seen: Seen upon arrival to the emergency department  I have reviewed the triage vital signs and the nursing notes.   HISTORY  Chief Complaint Altered Mental Status    HPI [name] is a 64 y.o. male with a history of end-stage renal disease on dialysis presents with nausea vomiting and altered mental status. Initially, EMS said all her mental status today with hallucinations. Wife at bedside after about 30-45 minutes of patient being in the emergency department and now says increased confusion since this past November with acutely altered mental status since 2 PM today. He also had 3 episodes of nonbilious and nonbloody vomiting. Last dialyzed this past Saturday. He usually goes Tuesday Thursday and Saturday. No complaints of pain.   Past Medical History  Diagnosis Date  . Chronic disease anemia   . Hypertension   . High cholesterol   . Type II diabetes mellitus   . History of blood transfusion     "related to anemia"  . GERD (gastroesophageal reflux disease)   . History of stomach ulcers   . Gout   . ESRD (end stage renal disease) on dialysis     "[facility]; [place]; [name]" (09/29/2014)    Patient Active Problem List   Diagnosis Date Noted  . Syncope 11/05/2014  . Contaminant given to patient   . Shortness of breath   . Viridans streptococci infection   . Polymicrobial bacterial infection   . Bacteremia   . 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 ESRD on dialysis   . 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 Acute coronary syndrome 09/29/2014    Past Surgical History  Procedure Laterality Date  . Kidney transplant Right 2004  . Peritoneal catheter insertion  02/2014  . Peritoneal catheter removal  08/2014  . Arteriovenous graft placement Left ~ 1996  . Thrombectomy /  arteriovenous graft revision  2015  . Nephrectomy transplanted organ  2015  . Cardiac catheterization  09/29/2014    "[place]"  . Percutaneous coronary stent intervention (pci-s) N/A 09/30/2014    Procedure: PERCUTANEOUS CORONARY STENT INTERVENTION (PCI-S);  Surgeon: [name], MD;  Location: [facility] CATH LAB;  Service: Cardiovascular;  Laterality: N/A;    Current Outpatient Rx  Name  Route  Sig  Dispense  Refill  . allopurinol (ZYLOPRIM) 100 MG tablet   Oral   Take 100 mg by mouth daily.      10   . amLODipine (NORVASC) 5 MG tablet   Oral   Take 1 tablet (5 mg total) by mouth daily.   30 tablet   0   . aspirin 81 MG EC tablet   Oral   Take 81 mg by mouth daily.      10   . atorvastatin (LIPITOR) 40 MG tablet   Oral   Take 1 tablet (40 mg total) by mouth daily at 6 PM.   30 tablet   0   . calcium acetate (PHOSLO) 667 MG capsule   Oral   Take 1,334 mg by mouth 3 (three) times daily with meals.         . citalopram (CELEXA) 20 MG tablet   Oral   Take 1 tablet (20 mg total) by mouth daily.   30 tablet   0   . clopidogrel (PLAVIX) 75 MG tablet   Oral   Take 1 tablet (75 mg total)  by mouth daily with breakfast.   30 tablet   11   . levETIRAcetam (KEPPRA) 500 MG tablet   Oral   Take 500 mg by mouth every 12 (twelve) hours.         Marland Kitchen losartan (COZAAR) 100 MG tablet   Oral   Take 1 tablet (100 mg total) by mouth daily.   30 tablet   0   . Melatonin 3 MG TABS   Oral   Take 3 mg by mouth at bedtime.         . metoprolol (LOPRESSOR) 50 MG tablet   Oral   Take 50 mg by mouth every 12 (twelve) hours.       10   . multivitamin (RENA-VIT) TABS tablet   Oral   Take 1 tablet by mouth at bedtime. Patient taking differently: Take 1 tablet by mouth daily.    30 tablet   3   . pantoprazole (PROTONIX) 40 MG tablet   Oral   Take 40 mg by mouth daily.         Marland Kitchen acetaminophen (TYLENOL) 325 MG tablet   Oral   Take 650 mg by mouth every 6 (six) hours as  needed for mild pain or moderate pain.         . heparin 5000 UNIT/ML injection   Subcutaneous   Inject 1 mL (5,000 Units total) into the skin every 8 (eight) hours. Patient not taking: Reported on 12/12/2014   1 mL   5   . nitroGLYCERIN (NITROSTAT) 0.4 MG SL tablet   Sublingual   Place 1 tablet (0.4 mg total) under the tongue every 5 (five) minutes x 3 doses as needed for chest pain.   25 tablet   12     Allergies Ivp dye  History reviewed. No pertinent family history.  Social History History  Substance Use Topics  . Smoking status: Former Smoker    Types: Cigarettes  . Smokeless tobacco: Never Used     Comment: "quit smoking cigarettes in the 1980's"  . Alcohol Use: No    Review of Systems Constitutional: No fever/chills Eyes: No visual changes. ENT: No sore throat. Cardiovascular: Denies chest pain. Respiratory: Denies shortness of breath. Gastrointestinal: No abdominal pain.  No diarrhea.  No constipation. Genitourinary: Negative for dysuria. Musculoskeletal: Negative for back pain. Skin: Negative for rash. Neurological: Negative for headaches, focal weakness or numbness.  10-point ROS otherwise negative however review of systems is confounded by patient's altered mental status.  ____________________________________________   PHYSICAL EXAM:  VITAL SIGNS: ED Triage Vitals  Enc Vitals Group     BP 12/12/14 1708 179/93 mmHg     Pulse Rate 12/12/14 1708 82     Resp 12/12/14 1708 35     Temp 12/12/14 1708 98.3 F (36.8 C)     Temp Source 12/12/14 1708 Oral     SpO2 12/12/14 1708 100 %     Weight 12/12/14 1708 127 lb 3.2 oz (57.698 kg)     Height --      Head Cir --      Peak Flow --      Pain Score --      Pain Loc --      Pain Edu? --      Excl. in [place]? --     Constitutional: Nonverbal at this time. Not cooperative with procedures including IV stick.  Eyes: Conjunctivae are normal. PERRL. EOMI. Head: Atraumatic. Nose: No  congestion/rhinnorhea. Mouth/Throat: Mucous membranes  are moist.  Oropharynx non-erythematous. Neck: No stridor.   Cardiovascular: Normal rate, regular rhythm. Grossly normal heart sounds.  Good peripheral circulation. Left upper extremity with dialysis access with palpable thrill. Respiratory: Normal respiratory effort.  No retractions. Lungs CTAB. Gastrointestinal: Soft and nontender. No distention. No abdominal bruits. No CVA tenderness. Musculoskeletal: No lower extremity tenderness nor edema.  No joint effusions. Neurologic:  Not able to speak at this time. Holding head to the right. No gait instability. Moving all 4 extremities equally but not following commands. Skin:  Skin is warm, dry and intact. No rash noted. Psychiatric: Agitated and resistant to procedures from ER staff.   ____________________________________________   LABS (all labs ordered are listed, but only abnormal results are displayed)  Labs Reviewed  CBC WITH DIFFERENTIAL/PLATELET  COMPREHENSIVE METABOLIC PANEL  LIPASE, BLOOD  TROPONIN I   ____________________________________________  EKG  ED ECG REPORT I, [place],  Youlanda Roys, the attending physician, personally viewed and interpreted this ECG.   Date: 12/12/2014  EKG Time: 1907  Rate: 79  Rhythm: normal sinus rhythm  Axis: Normal axis  Intervals:Long QT  ST&T Change: Downsloping ST segments in V4 through V6 which are unchanged from previous EKG done on 11/05/2014.  ____________________________________________  RADIOLOGY  Chest x-ray with by basilar atelectasis without acute findings. I personally reviewed this image. No acute abnormality on head CT. No acute findings on her abdomen and pelvis. CT of the C-spine without acute findings  ____________________________________________   PROCEDURES  Right basilic vein ultrasound-guided IV placed with long catheter 18-gauge. Good blood return and easy to flush.  NIH Stroke Scale    Time:  6PM Person Administering Scale: Doran Stabler  Administer stroke scale items in the order listed. Record performance in each category after each subscale exam. Do not go back and change scores. Follow directions provided for each exam technique. Scores should reflect what the patient does, not what the clinician thinks the patient can do. The clinician should record answers while administering the exam and work quickly. Except where indicated, the patient should not be coached (i.e., repeated requests to patient to make a special effort).   1a  Level of consciousness: 0=alert; keenly responsive  1b. LOC questions:  2=Performs neither task correctly  1c. LOC commands: 2=Performs neither task correctly  2.  Best Gaze: 0=normal  3.  Visual: 0=No visual loss  4. Facial Palsy: 0=Normal symmetric movement  5a.  Motor left arm: 0=No drift, limb holds 90 (or 45) degrees for full 10 seconds  5b.  Motor right arm: 0=No drift, limb holds 90 (or 45) degrees for full 10 seconds  6a. motor left leg: 0=No drift, limb holds 90 (or 45) degrees for full 10 seconds  6b  Motor right leg:  0=No drift, limb holds 90 (or 45) degrees for full 10 seconds  7. Limb Ataxia: 0=Absent  8.  Sensory: 0=Normal; no sensory loss  9. Best Language:  3=Mute, global aphasia; no usable speech or auditory comprehension  10. Dysarthria: 2=Severe; patient speech is so slurred as to be unintelligible in the absence of or our of proportion to any dysphagia, or is mute/anarthric  11. Extinction and Inattention: 0=No abnormality  12. Distal motor function: 0=Normal   Total:   7   Stroke scale confounded by patient inability to participate. ____________________________________________   INITIAL IMPRESSION / ASSESSMENT AND PLAN / ED COURSE  Pertinent labs & imaging results that were available during my care of the patient were reviewed by  me and considered in my medical decision making (see chart for  details).  ----------------------------------------- 8:14 PM on 12/12/2014 -----------------------------------------  Patient continues with altered mental status. I suspect this may be related to an ischemic CVA. His workup does not explain his new symptoms. Patient is not a TPA candidate for multiple reasons but mostly because of declining mental status since November and recent subdural hemorrhage in April. Results explained to wife and also need for admission. Signed out to Dr. Jannifer Franklin of the medicine service. ____________________________________________   FINAL CLINICAL IMPRESSION(S) / ED DIAGNOSES  Acute altered mental status.     Orbie Pyo, MD 12/12/14 2015  Per the wife the patient does not make urine.  Orbie Pyo, MD 12/12/14 2016

## 2014-12-13 ENCOUNTER — Inpatient Hospital Stay: Payer: BLUE CROSS/BLUE SHIELD

## 2014-12-13 LAB — GLUCOSE, CAPILLARY
Glucose-Capillary: 127 mg/dL — ABNORMAL HIGH (ref 65–99)
Glucose-Capillary: 73 mg/dL (ref 65–99)
Glucose-Capillary: 73 mg/dL (ref 65–99)
Glucose-Capillary: 96 mg/dL (ref 65–99)

## 2014-12-13 LAB — CBC
HCT: 22.2 % — ABNORMAL LOW (ref 40.0–52.0)
HEMOGLOBIN: 7.1 g/dL — AB (ref 13.0–18.0)
MCH: 23.6 pg — AB (ref 26.0–34.0)
MCHC: 31.9 g/dL — ABNORMAL LOW (ref 32.0–36.0)
MCV: 74.1 fL — ABNORMAL LOW (ref 80.0–100.0)
Platelets: 155 10*3/uL (ref 150–440)
RBC: 3 MIL/uL — ABNORMAL LOW (ref 4.40–5.90)
RDW: 22.2 % — AB (ref 11.5–14.5)
WBC: 5.7 10*3/uL (ref 3.8–10.6)

## 2014-12-13 LAB — BASIC METABOLIC PANEL
Anion gap: 8 (ref 5–15)
BUN: 22 mg/dL — AB (ref 6–20)
CHLORIDE: 100 mmol/L — AB (ref 101–111)
CO2: 33 mmol/L — ABNORMAL HIGH (ref 22–32)
CREATININE: 9.31 mg/dL — AB (ref 0.61–1.24)
Calcium: 10.5 mg/dL — ABNORMAL HIGH (ref 8.9–10.3)
GFR calc non Af Amer: 5 mL/min — ABNORMAL LOW (ref >60)
GFR, EST AFRICAN AMERICAN: 6 mL/min — AB (ref >60)
Glucose, Bld: 92 mg/dL (ref 65–99)
Potassium: 3.6 mmol/L (ref 3.5–5.1)
Sodium: 141 mmol/L (ref 135–145)

## 2014-12-13 MED ORDER — DEXTROSE-NACL 5-0.45 % IV SOLN
INTRAVENOUS | Status: DC
  Administered 2014-12-13 – 2014-12-16 (×3): via INTRAVENOUS

## 2014-12-13 MED ORDER — EPOETIN ALFA 10000 UNIT/ML IJ SOLN
4000.0000 [IU] | Freq: Once | INTRAMUSCULAR | Status: AC
  Administered 2014-12-13: 4000 [IU] via INTRAVENOUS

## 2014-12-13 NOTE — Progress Notes (Signed)
Initial Nutrition Assessment       INTERVENTION:   (Nutrition Supplement Therapy: ) If unable to meet nutritional needs will add supplement once able to take po. Noted SLP evaluation pending  NUTRITION DIAGNOSIS:  Inadequate oral intake related to lethargy/confusion, acute illness as evidenced by NPO status.    GOAL:   (Goal would be for diet to be progressed within 5-7 days)    MONITOR:   (Energy intake, Anthropometric, Electrolyte and renal profile)  REASON FOR ASSESSMENT:  Malnutrition Screening Tool    ASSESSMENT:  Pt admitted with AMS. Currently in dialysis during rounds this pm  PMHx:  Past Medical History  Diagnosis Date  . Chronic disease anemia   . Hypertension   . High cholesterol   . Type II diabetes mellitus   . History of blood transfusion     "related to anemia"  . GERD (gastroesophageal reflux disease)   . History of stomach ulcers   . Gout   . ESRD (end stage renal disease) on dialysis     "[facility]; [facility]; [name]" (09/29/2014)  . Chronic diastolic congestive heart failure     Diet Order:NPO  Current Nutrition: NPO  Food/Nutrition-Related History: Unable to determine intake prior to admission, per MST poor po intake prior to admission   Medications: aspart  Electrolyte/Renal Profile and Glucose Profile:   Recent Labs Lab 12/12/14 1911 12/13/14 1155  NA 141 141  K 3.5 3.6  CL 97* 100*  CO2 32 33*  BUN 17 22*  CREATININE 7.93* 9.31*  CALCIUM 11.3* 10.5*  GLUCOSE 177* 92   Protein Profile:  Recent Labs Lab 12/12/14 1911  ALBUMIN 3.4*     Last BM:6/28  Nutrition-Focused Physical Exam Findings:  Unable to complete Nutrition-Focused physical exam at this time.     Weight Change: 16% weight loss in the last 2 months per wt encounters  Anthropometrics:    Height:  Ht Readings from Last 1 Encounters:  12/13/14 5\' 1"  (1.549 m)    Weight:  Wt Readings from Last 1 Encounters:  12/13/14 107 lb 12.9 oz (48.9  kg)       Wt Readings from Last 10 Encounters:  12/13/14 107 lb 12.9 oz (48.9 kg)  12/13/14 108 lb (48.988 kg)  11/09/14 117 lb 12.8 oz (53.434 kg)  10/01/14 128 lb 12 oz (58.4 kg)    BMI:  Body mass index is 20.38 kg/(m^2).  Estimated Nutritional Needs:  Kcal:  BEE 1148 kcals (IF 1.0-1.3, AF 1.3) [id number] kcals/d   Protein:  (1.2-1.5 g/d) 59-74g/d  Fluid:  (1021ml + UOP)  Skin:  Reviewed, no issues  Diet Order:  Diet NPO time specified  EDUCATION NEEDS:  No education needs identified at this time   Intake/Output Summary (Last 24 hours) at 12/13/14 1246 Last data filed at 12/13/14 1032  Gross per 24 hour  Intake    400 ml  Output      0 ml  Net    400 ml     MODERATE Care Level Mignonne Afonso B. Zenia Resides, [place], [place] (pager)

## 2014-12-13 NOTE — Care Management Note (Signed)
Patient is active at [facility] on TTS schedule.  I will send updated medical records at discharge. Iran Sizer  [phone]

## 2014-12-13 NOTE — Progress Notes (Addendum)
Subjective:   Patient admitted for altered mental status. Wife at bedside report that he vomited at home after he was given some meds and then became confused. They suspected a seizure therefore he was brought for evaluation No acute resp issues   Objective:  Vital signs in last 24 hours:  Temp:  [97.8 F (36.6 C)-99 F (37.2 C)] 97.8 F (36.6 C) (06/28 0912) Pulse Rate:  [63-82] 63 (06/28 0912) Resp:  [15-35] 17 (06/28 0912) BP: (156-206)/(73-145) 162/73 mmHg (06/28 0912) SpO2:  [95 %-100 %] 100 % (06/28 0912) Weight:  [48.9 kg (107 lb 12.9 oz)-57.698 kg (127 lb 3.2 oz)] 48.9 kg (107 lb 12.9 oz) (06/28 0957)  Weight change:  Filed Weights   12/12/14 1708 12/13/14 0010 12/13/14 0957  Weight: 57.698 kg (127 lb 3.2 oz) 49.351 kg (108 lb 12.8 oz) 48.9 kg (107 lb 12.9 oz)    Intake/Output: I/O last 3 completed shifts: In: 300 [IV Piggyback:300] Out: -      Physical Exam: General: NAD, laying in bed  HEENT Eyes closed  Neck supple  Pulm/lungs Normal resp effort, no crackles or wheezing  CVS/Heart Regular, no rub  Abdomen:  Soft, non tender  Extremities: No edema  Neurologic: Lethargic, not able to arouse with sound or touch stimuli  Skin: No acute rashes  Access:        Basic Metabolic Panel:  Recent Labs Lab 12/12/14 1911  NA 141  K 3.5  CL 97*  CO2 32  GLUCOSE 177*  BUN 17  CREATININE 7.93*  CALCIUM 11.3*     CBC:  Recent Labs Lab 12/12/14 1911  WBC 8.8  NEUTROABS 5.8  HGB 9.0*  HCT 28.6*  MCV 75.1*  PLT 220      Microbiology: Results for orders placed or performed during the hospital encounter of 12/12/14  Culture, blood (routine x 2)     Status: None (Preliminary result)   Collection Time: 12/12/14  9:05 PM  Result Value Ref Range Status   Specimen Description BLOOD  Final   Special Requests Normal  Final   Culture NO GROWTH < 12 HOURS  Final   Report Status PENDING  Incomplete    Coagulation Studies: No results for input(s):  LABPROT, INR in the last 72 hours.  Urinalysis: No results for input(s): COLORURINE, LABSPEC, PHURINE, GLUCOSEU, HGBUR, BILIRUBINUR, KETONESUR, PROTEINUR, UROBILINOGEN, NITRITE, LEUKOCYTESUR in the last 72 hours.  Invalid input(s): APPERANCEUR    Imaging: Ct Abdomen Pelvis Wo Contrast  12/12/2014   CLINICAL DATA:  Altered mental status.  Vomiting.  EXAM: CT ABDOMEN AND PELVIS WITHOUT CONTRAST  TECHNIQUE: Multidetector CT imaging of the abdomen and pelvis was performed following the standard protocol without IV contrast.  COMPARISON:  10/07/2014  FINDINGS: Lower chest:  Small bilateral pleural effusions are noted.  Hepatobiliary: No suspicious liver abnormality. No gallstones. No biliary dilatation.  Pancreas: Negative  Spleen: Negative  Adrenals/Urinary Tract: The adrenal glands are normal. Bilateral renal calculi are noted. The largest stone is in the inferior pole of the right kidney measuring 5 mm, image 28/series 2. The kidneys are atrophic and there are small bilateral renal cysts. These are incompletely characterized without IV contrast. The urinary bladder appears collapsed.  Stomach/Bowel: The stomach is normal. The small bowel loops have a normal course and caliber. The appendix is visualized and appears normal. There is numerous colonic diverticula. The transverse colon through the distal descending colon appears collapsed. Sigmoid colon and rectum are unremarkable.  Vascular/Lymphatic: Aortic atherosclerosis noted.  No aneurysm. No upper abdominal adenopathy. No pelvic or inguinal adenopathy.  Reproductive: The prostate gland and seminal vesicles are unremarkable.  Other: Again noted are postoperative changes within the right lower quadrant of the abdomen, image 50 of series 2.  Likely related to prior renal graft. Left inguinal hernia is noted. Fluid is identified within the distal left inguinal canal. New from previous exam.  Musculoskeletal: Changes of renal osteodystrophy noted.  IMPRESSION:  1. There are no acute findings identified within the abdomen or pelvis. No evidence for bowel obstruction to explain patient's vomiting. 2. Aortic atherosclerosis. 3. Bilateral end-stage kidneys. 4. Colonic diverticulosis. 5. Small bilateral pleural effusions.   Electronically Signed   By: Kerby Moors M.D.   On: 12/12/2014 19:01   Dg Chest 1 View  12/12/2014   CLINICAL DATA:  Altered mental status and vomiting.  EXAM: CHEST  1 VIEW  COMPARISON:  11/05/2014  FINDINGS: Mild atelectasis present at both lung bases. There is no evidence of pulmonary edema, consolidation, pneumothorax, nodule or pleural fluid. The aorta is ectatic. The heart size is at the upper limits of normal.  IMPRESSION: Bibasilar atelectasis.  No acute findings.   Electronically Signed   By: Aletta Edouard M.D.   On: 12/12/2014 19:00   Ct Head Wo Contrast  12/12/2014   CLINICAL DATA:  Altered mental status today.  EXAM: CT HEAD WITHOUT CONTRAST  TECHNIQUE: Contiguous axial images were obtained from the base of the skull through the vertex without intravenous contrast.  COMPARISON:  Head CT scan 11/05/2014.  FINDINGS: There is no evidence of acute intracranial abnormality including hemorrhage, infarct, mass lesion, mass effect, midline shift or abnormal extra-axial fluid collection cortical atrophy and chronic microvascular ischemic change are identified. There is no hydrocephalus or pneumocephalus. The calvarium is intact.  IMPRESSION: No acute abnormality.   Electronically Signed   By: Inge Rise M.D.   On: 12/12/2014 18:57   Ct Cervical Spine Wo Contrast  12/12/2014   CLINICAL DATA:  Status post fall 1 week ago. Altered mental status. Turning the head to the right. Initial encounter.  EXAM: CT CERVICAL SPINE WITHOUT CONTRAST  TECHNIQUE: Multidetector CT imaging of the cervical spine was performed without intravenous contrast. Multiplanar CT image reconstructions were also generated.  COMPARISON:  None.  FINDINGS: No fracture or  malalignment is identified. Loss of disc space height is seen at C5-6. Imaged paraspinous structures are unremarkable. Lung apices are clear.  IMPRESSION: No acute abnormality.   Electronically Signed   By: Inge Rise M.D.   On: 12/12/2014 19:56   Mr Brain Wo Contrast  12/13/2014   CLINICAL DATA:  64 year old hypertensive diabetic male with hyperlipidemia presenting with altered mental status. End-stage renal disease on dialysis. Subsequent encounter.  EXAM: MRI HEAD WITHOUT CONTRAST  TECHNIQUE: Multiplanar, multiecho pulse sequences of the brain and surrounding structures were obtained without intravenous contrast.  COMPARISON:  12/12/2014 head CT.  No comparison brain MR.  FINDINGS: Examination is limited to four motion degraded sequences.  No acute infarct.  Global atrophy without hydrocephalus.  No obvious intracranial hemorrhage.  No intracranial mass lesion noted on this motion degraded limited exam.  Limited for evaluation for the possibility of encephalopathy.  Major intracranial vascular structures appear patent.  Osseous changes of renal disease.  IMPRESSION: Significantly limited exam without evidence of acute infarct.  Global atrophy.  Please see above.   Electronically Signed   By: Genia Del M.D.   On: 12/13/2014 08:52  Medications:     . aspirin  300 mg Rectal Daily  . cefTRIAXone (ROCEPHIN)  IV  1 g Intravenous Q24H  . heparin  5,000 Units Subcutaneous 3 times per day  . insulin aspart  0-5 Units Subcutaneous QHS  . insulin aspart  0-9 Units Subcutaneous TID WC  . levETIRAcetam  500 mg Intravenous Q12H  . sodium chloride  3 mL Intravenous Q12H   acetaminophen **OR** acetaminophen, labetalol  Assessment/ Plan:  64 y.o. 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 End Stage Renal Disease: kidney transplant 2004  status post transplant nephrectomy 04/2014 - Pt due for HD again today, orders have been prepared.    2. SHPTH: monitor phos    3. Anemia of CKD - hgb 9.0, continue epogen with HD.   4. Altered mental status. - Infection and seizure work up is in progress    LOS: 1 [name] 6/28/201610:40 AM

## 2014-12-13 NOTE — Progress Notes (Signed)
Subjective:   Brought to ED by family members for altered mental status. He vomited about 3 times and became more confused. They witnessed some jumping in his legs and arms. Seen during hemodialysis. Minimally responsive to verbal stimuli. He did not open his eyes. Did not answer any questions. No family members at bedside currently.  Objective:   Vital signs in last 24 hours: Temp:  [97.6 F (36.4 C)-99 F (37.2 C)] 97.6 F (36.4 C) (06/28 1121) Pulse Rate:  [54-82] 65 (06/28 1300) Resp:  [15-35] 19 (06/28 1300) BP: (137-206)/(64-145) 137/64 mmHg (06/28 1300) SpO2:  [95 %-100 %] 100 % (06/28 0912) Weight:  [48.9 kg (107 lb 12.9 oz)-57.698 kg (127 lb 3.2 oz)] 48.9 kg (107 lb 12.9 oz) (06/28 0957) Weight change:  Last BM Date: 12/13/14  Intake/Output from previous day: 06/27 0701 - 06/28 0700 In: 300 [IV Piggyback:300] Out: -  Intake/Output this shift: Total I/O In: 100 [IV Piggyback:100] Out: -    General: Non-verbal, non-communicative, NAD HENT: no JVD, no pallor, no icterus Chest: decreased air entry at both bases, clear to auscultation, no rhonchi or crackles. CVS: RRR, S1S2, no tachycardia. Abdomen: soft, nontender, no mass. Ext: no lower leg edema.  Neuro: some spontaneous movements of upper and lower extremities noted.  Lab Results:  Recent Labs  12/12/14 1911 12/13/14 1155  WBC 8.8 5.7  HGB 9.0* 7.1*  HCT 28.6* 22.2*  PLT 220 155   BMET  Recent Labs  12/12/14 1911 12/13/14 1155  NA 141 141  K 3.5 3.6  CL 97* 100*  CO2 32 33*  GLUCOSE 177* 92  BUN 17 22*  CREATININE 7.93* 9.31*  CALCIUM 11.3* 10.5*    Studies/Results: Ct Abdomen Pelvis Wo Contrast  12/12/2014   CLINICAL DATA:  Altered mental status.  Vomiting.  EXAM: CT ABDOMEN AND PELVIS WITHOUT CONTRAST  TECHNIQUE: Multidetector CT imaging of the abdomen and pelvis was performed following the standard protocol without IV contrast.  COMPARISON:  10/07/2014  FINDINGS: Lower chest:  Small  bilateral pleural effusions are noted.  Hepatobiliary: No suspicious liver abnormality. No gallstones. No biliary dilatation.  Pancreas: Negative  Spleen: Negative  Adrenals/Urinary Tract: The adrenal glands are normal. Bilateral renal calculi are noted. The largest stone is in the inferior pole of the right kidney measuring 5 mm, image 28/series 2. The kidneys are atrophic and there are small bilateral renal cysts. These are incompletely characterized without IV contrast. The urinary bladder appears collapsed.  Stomach/Bowel: The stomach is normal. The small bowel loops have a normal course and caliber. The appendix is visualized and appears normal. There is numerous colonic diverticula. The transverse colon through the distal descending colon appears collapsed. Sigmoid colon and rectum are unremarkable.  Vascular/Lymphatic: Aortic atherosclerosis noted. No aneurysm. No upper abdominal adenopathy. No pelvic or inguinal adenopathy.  Reproductive: The prostate gland and seminal vesicles are unremarkable.  Other: Again noted are postoperative changes within the right lower quadrant of the abdomen, image 50 of series 2.  Likely related to prior renal graft. Left inguinal hernia is noted. Fluid is identified within the distal left inguinal canal. New from previous exam.  Musculoskeletal: Changes of renal osteodystrophy noted.  IMPRESSION: 1. There are no acute findings identified within the abdomen or pelvis. No evidence for bowel obstruction to explain patient's vomiting. 2. Aortic atherosclerosis. 3. Bilateral end-stage kidneys. 4. Colonic diverticulosis. 5. Small bilateral pleural effusions.   Electronically Signed   By: Kerby Moors M.D.   On: 12/12/2014 19:01  Dg Chest 1 View  12/12/2014   CLINICAL DATA:  Altered mental status and vomiting.  EXAM: CHEST  1 VIEW  COMPARISON:  11/05/2014  FINDINGS: Mild atelectasis present at both lung bases. There is no evidence of pulmonary edema, consolidation, pneumothorax,  nodule or pleural fluid. The aorta is ectatic. The heart size is at the upper limits of normal.  IMPRESSION: Bibasilar atelectasis.  No acute findings.   Electronically Signed   By: Aletta Edouard M.D.   On: 12/12/2014 19:00   Ct Head Wo Contrast  12/12/2014   CLINICAL DATA:  Altered mental status today.  EXAM: CT HEAD WITHOUT CONTRAST  TECHNIQUE: Contiguous axial images were obtained from the base of the skull through the vertex without intravenous contrast.  COMPARISON:  Head CT scan 11/05/2014.  FINDINGS: There is no evidence of acute intracranial abnormality including hemorrhage, infarct, mass lesion, mass effect, midline shift or abnormal extra-axial fluid collection cortical atrophy and chronic microvascular ischemic change are identified. There is no hydrocephalus or pneumocephalus. The calvarium is intact.  IMPRESSION: No acute abnormality.   Electronically Signed   By: Inge Rise M.D.   On: 12/12/2014 18:57   Ct Cervical Spine Wo Contrast  12/12/2014   CLINICAL DATA:  Status post fall 1 week ago. Altered mental status. Turning the head to the right. Initial encounter.  EXAM: CT CERVICAL SPINE WITHOUT CONTRAST  TECHNIQUE: Multidetector CT imaging of the cervical spine was performed without intravenous contrast. Multiplanar CT image reconstructions were also generated.  COMPARISON:  None.  FINDINGS: No fracture or malalignment is identified. Loss of disc space height is seen at C5-6. Imaged paraspinous structures are unremarkable. Lung apices are clear.  IMPRESSION: No acute abnormality.   Electronically Signed   By: Inge Rise M.D.   On: 12/12/2014 19:56   Mr Brain Wo Contrast  12/13/2014   CLINICAL DATA:  64 year old hypertensive diabetic male with hyperlipidemia presenting with altered mental status. End-stage renal disease on dialysis. Subsequent encounter.  EXAM: MRI HEAD WITHOUT CONTRAST  TECHNIQUE: Multiplanar, multiecho pulse sequences of the brain and surrounding structures were  obtained without intravenous contrast.  COMPARISON:  12/12/2014 head CT.  No comparison brain MR.  FINDINGS: Examination is limited to four motion degraded sequences.  No acute infarct.  Global atrophy without hydrocephalus.  No obvious intracranial hemorrhage.  No intracranial mass lesion noted on this motion degraded limited exam.  Limited for evaluation for the possibility of encephalopathy.  Major intracranial vascular structures appear patent.  Osseous changes of renal disease.  IMPRESSION: Significantly limited exam without evidence of acute infarct.  Global atrophy.  Please see above.   Electronically Signed   By: Genia Del M.D.   On: 12/13/2014 08:52    Medications: I have reviewed the patient's current medications.  Marland Kitchen aspirin  300 mg Rectal Daily  . cefTRIAXone (ROCEPHIN)  IV  1 g Intravenous Q24H  . heparin  5,000 Units Subcutaneous 3 times per day  . insulin aspart  0-5 Units Subcutaneous QHS  . insulin aspart  0-9 Units Subcutaneous TID WC  . levETIRAcetam  500 mg Intravenous Q12H  . sodium chloride  3 mL Intravenous Q12H     Assessment and Plan:    LOS: 1 day   Acute encephalopathy: likely metabolic. No acute findings on MRI. No leukocytosis or left shift. Afebrile. Blood cultures were sent, negative at 12 hours, continue empiric Vancomycin and Rocephin. Possible seizure activity, given history of shaking, shall continue Keppra, EEG pending. Neurology  evaluation. Patient is unable to take any oral medications. Speech evaluation. End-stage renal disease on hemodialysis: closely followed by nephrology. Hypertension: is fairly controlled. Type 2 diabetes: shall monitor, continue sliding scale insulin. Hyperlipidemia: statin on hold for now.  Gastroesophageal reflux: resume PPI. Depression: holding Celexa for now.    Keonta Alsip, MD 12/13/2014, 1:26 PM

## 2014-12-13 NOTE — Progress Notes (Signed)
MD called due to FSBS of 56 and pt is non verbal and NPO. MD ordered IV fluids and a neuro consult

## 2014-12-14 DIAGNOSIS — R404 Transient alteration of awareness: Secondary | ICD-10-CM

## 2014-12-14 LAB — CBC WITH DIFFERENTIAL/PLATELET
BASOS PCT: 1 %
Basophils Absolute: 0.1 10*3/uL (ref 0–0.1)
EOS ABS: 1.2 10*3/uL — AB (ref 0–0.7)
EOS PCT: 19 %
HEMATOCRIT: 23.9 % — AB (ref 40.0–52.0)
HEMOGLOBIN: 7.5 g/dL — AB (ref 13.0–18.0)
LYMPHS PCT: 38 %
Lymphs Abs: 2.3 10*3/uL (ref 1.0–3.6)
MCH: 23.6 pg — ABNORMAL LOW (ref 26.0–34.0)
MCHC: 31.6 g/dL — AB (ref 32.0–36.0)
MCV: 74.6 fL — AB (ref 80.0–100.0)
MONO ABS: 0.7 10*3/uL (ref 0.2–1.0)
MONOS PCT: 11 %
NEUTROS ABS: 1.9 10*3/uL (ref 1.4–6.5)
Neutrophils Relative %: 31 %
Platelets: 162 10*3/uL (ref 150–440)
RBC: 3.2 MIL/uL — ABNORMAL LOW (ref 4.40–5.90)
RDW: 22.8 % — ABNORMAL HIGH (ref 11.5–14.5)
WBC: 6.2 10*3/uL (ref 3.8–10.6)

## 2014-12-14 LAB — BASIC METABOLIC PANEL
Anion gap: 9 (ref 5–15)
BUN: 10 mg/dL (ref 6–20)
CALCIUM: 8.5 mg/dL — AB (ref 8.9–10.3)
CO2: 30 mmol/L (ref 22–32)
CREATININE: 4.97 mg/dL — AB (ref 0.61–1.24)
Chloride: 101 mmol/L (ref 101–111)
GFR, EST AFRICAN AMERICAN: 13 mL/min — AB (ref >60)
GFR, EST NON AFRICAN AMERICAN: 11 mL/min — AB (ref >60)
Glucose, Bld: 86 mg/dL (ref 65–99)
Potassium: 3.9 mmol/L (ref 3.5–5.1)
SODIUM: 140 mmol/L (ref 135–145)

## 2014-12-14 LAB — HEPATITIS B SURFACE ANTIGEN: Hepatitis B Surface Ag: NEGATIVE

## 2014-12-14 LAB — HEPATITIS B SURFACE ANTIBODY, QUANTITATIVE: Hepatitis B-Post: 132.3 m[IU]/mL (ref >9.9)

## 2014-12-14 LAB — GLUCOSE, CAPILLARY
GLUCOSE-CAPILLARY: 80 mg/dL (ref 65–99)
GLUCOSE-CAPILLARY: 85 mg/dL (ref 65–99)
Glucose-Capillary: 92 mg/dL (ref 65–99)
Glucose-Capillary: 106 mg/dL — ABNORMAL HIGH (ref 65–99)

## 2014-12-14 NOTE — Consult Note (Signed)
HPI: [name] is an 64 y.o. male  with a known history of end-stage renal disease on dialysis, hypertension, diabetes, hyperlipidemia and heart disease. Admitted with confusion/disorientation.   There was suspicion of seizure activity and pt was started on Keppra 500 BID.  Appears more alert as states name, month and the name of hospita EEG done shows generalized slowing.      Past Medical History  Diagnosis Date  . Chronic disease anemia   . Hypertension   . High cholesterol   . Type II diabetes mellitus   . History of blood transfusion     "related to anemia"  . GERD (gastroesophageal reflux disease)   . History of stomach ulcers   . Gout   . ESRD (end stage renal disease) on dialysis     "[facility] [place]; [name]" (09/29/2014)  . Chronic diastolic congestive heart failure     Past Surgical History  Procedure Laterality Date  . Kidney transplant Right 2004  . Peritoneal catheter insertion  02/2014  . Peritoneal catheter removal  08/2014  . Arteriovenous graft placement Left ~ 1996  . Thrombectomy / arteriovenous graft revision  2015  . Nephrectomy transplanted organ  2015  . Cardiac catheterization  09/29/2014    "[place]"  . Percutaneous coronary stent intervention (pci-s) N/A 09/30/2014    Procedure: PERCUTANEOUS CORONARY STENT INTERVENTION (PCI-S);  Surgeon: [name], MD;  Location: [facility] CATH LAB;  Service: Cardiovascular;  Laterality: N/A;    Family History  Problem Relation Age of Onset  . Hypertension Mother   . Stroke Mother   . Hypertension Father   . Diabetes Mellitus II Father   . Asthma Father    Social History:  reports that he has quit smoking. His smoking use included Cigarettes. He has never used smokeless tobacco. He reports that he does not drink alcohol or use illicit drugs.  Allergies:  Allergies  Allergen Reactions  . Ivp Dye [Iodinated Diagnostic Agents]     Medications: I have reviewed the patient's current medications.  ROS: Unable  to obtain to to confusion.   Physical Examination: Blood pressure 175/65, pulse 70, temperature 98 F (36.7 C), temperature source Oral, resp. rate 16, height 5\' 1"  (1.549 m), weight 49.2 kg (108 lb 7.5 oz), SpO2 100 %.   Neurological Examination Mental Status: Alert, oriented, to place and month Cranial Nerves: II: Discs flat bilaterally; Visual fields grossly normal, pupils equal, round, reactive to light and accommodation III,IV, VI: ptosis not present, extra-ocular motions intact bilaterally V,VII: smile symmetric, facial light touch sensation normal bilaterally VIII: hearing normal bilaterally IX,X: gag reflex present XI: bilateral shoulder shrug XII: midline tongue extension Motor: Right : Upper extremity   4/5    Left:     Upper extremity   4/5  Lower extremity   4/5     Lower extremity   4/5 Tone and bulk:normal tone throughout; no atrophy noted Sensory: Pinprick and light touch intact throughout, bilaterally Plantars: Right: downgoing   Left: downgoing Cerebellar: normal finger-to-nose, normal rapid alternating movements and normal heel-to-shin test       Laboratory Studies:  Basic Metabolic Panel:  Recent Labs Lab 12/12/14 1911 12/13/14 1155 12/14/14 1015  NA 141 141 140  K 3.5 3.6 3.9  CL 97* 100* 101  CO2 32 33* 30  GLUCOSE 177* 92 86  BUN 17 22* 10  CREATININE 7.93* 9.31* 4.97*  CALCIUM 11.3* 10.5* 8.5*    Liver Function Tests:  Recent Labs Lab 12/12/14 1911  AST 29  ALT 9*  ALKPHOS 96  BILITOT 0.9  PROT 7.0  ALBUMIN 3.4*    Recent Labs Lab 12/12/14 1911  LIPASE 42   No results for input(s): AMMONIA in the last 168 hours.  CBC:  Recent Labs Lab 12/12/14 1911 12/13/14 1155 12/14/14 1015  WBC 8.8 5.7 6.2  NEUTROABS 5.8  --  1.9  HGB 9.0* 7.1* 7.5*  HCT 28.6* 22.2* 23.9*  MCV 75.1* 74.1* 74.6*  PLT 220 155 162    Cardiac Enzymes:  Recent Labs Lab 12/12/14 1911  TROPONINI 0.04*    BNP: Invalid input(s):  POCBNP  CBG:  Recent Labs Lab 12/13/14 0934 12/13/14 1655 12/13/14 2058 12/14/14 0739 12/14/14 1115  GLUCAP 96 73 73 80 71    Microbiology: Results for orders placed or performed during the hospital encounter of 12/12/14  Culture, blood (routine x 2)     Status: None (Preliminary result)   Collection Time: 12/12/14  9:05 PM  Result Value Ref Range Status   Specimen Description BLOOD  Final   Special Requests Normal  Final   Culture NO GROWTH 2 DAYS  Final   Report Status PENDING  Incomplete    Coagulation Studies: No results for input(s): LABPROT, INR in the last 72 hours.  Urinalysis: No results for input(s): COLORURINE, LABSPEC, PHURINE, GLUCOSEU, HGBUR, BILIRUBINUR, KETONESUR, PROTEINUR, UROBILINOGEN, NITRITE, LEUKOCYTESUR in the last 168 hours.  Invalid input(s): APPERANCEUR  Lipid Panel:    Component Value Date/Time   CHOL 115 09/30/2014 0640   TRIG 79 09/30/2014 0640   HDL 30* 09/30/2014 0640   CHOLHDL 3.8 09/30/2014 0640   VLDL 16 09/30/2014 0640   LDLCALC 69 09/30/2014 0640    HgbA1C:  Lab Results  Component Value Date   HGBA1C 5.7* 10/01/2014    Urine Drug Screen:  No results found for: LABOPIA, COCAINSCRNUR, LABBENZ, AMPHETMU, THCU, LABBARB  Alcohol Level: No results for input(s): ETH in the last 168 hours.  Other results: EKG: normal EKG, normal sinus rhythm, unchanged from previous tracings.  Imaging: Ct Abdomen Pelvis Wo Contrast  12/12/2014   CLINICAL DATA:  Altered mental status.  Vomiting.  EXAM: CT ABDOMEN AND PELVIS WITHOUT CONTRAST  TECHNIQUE: Multidetector CT imaging of the abdomen and pelvis was performed following the standard protocol without IV contrast.  COMPARISON:  10/07/2014  FINDINGS: Lower chest:  Small bilateral pleural effusions are noted.  Hepatobiliary: No suspicious liver abnormality. No gallstones. No biliary dilatation.  Pancreas: Negative  Spleen: Negative  Adrenals/Urinary Tract: The adrenal glands are normal. Bilateral  renal calculi are noted. The largest stone is in the inferior pole of the right kidney measuring 5 mm, image 28/series 2. The kidneys are atrophic and there are small bilateral renal cysts. These are incompletely characterized without IV contrast. The urinary bladder appears collapsed.  Stomach/Bowel: The stomach is normal. The small bowel loops have a normal course and caliber. The appendix is visualized and appears normal. There is numerous colonic diverticula. The transverse colon through the distal descending colon appears collapsed. Sigmoid colon and rectum are unremarkable.  Vascular/Lymphatic: Aortic atherosclerosis noted. No aneurysm. No upper abdominal adenopathy. No pelvic or inguinal adenopathy.  Reproductive: The prostate gland and seminal vesicles are unremarkable.  Other: Again noted are postoperative changes within the right lower quadrant of the abdomen, image 50 of series 2.  Likely related to prior renal graft. Left inguinal hernia is noted. Fluid is identified within the distal left inguinal canal. New from previous exam.  Musculoskeletal: Changes  of renal osteodystrophy noted.  IMPRESSION: 1. There are no acute findings identified within the abdomen or pelvis. No evidence for bowel obstruction to explain patient's vomiting. 2. Aortic atherosclerosis. 3. Bilateral end-stage kidneys. 4. Colonic diverticulosis. 5. Small bilateral pleural effusions.   Electronically Signed   By: Kerby Moors M.D.   On: 12/12/2014 19:01   Dg Chest 1 View  12/12/2014   CLINICAL DATA:  Altered mental status and vomiting.  EXAM: CHEST  1 VIEW  COMPARISON:  11/05/2014  FINDINGS: Mild atelectasis present at both lung bases. There is no evidence of pulmonary edema, consolidation, pneumothorax, nodule or pleural fluid. The aorta is ectatic. The heart size is at the upper limits of normal.  IMPRESSION: Bibasilar atelectasis.  No acute findings.   Electronically Signed   By: Aletta Edouard M.D.   On: 12/12/2014 19:00    Ct Head Wo Contrast  12/12/2014   CLINICAL DATA:  Altered mental status today.  EXAM: CT HEAD WITHOUT CONTRAST  TECHNIQUE: Contiguous axial images were obtained from the base of the skull through the vertex without intravenous contrast.  COMPARISON:  Head CT scan 11/05/2014.  FINDINGS: There is no evidence of acute intracranial abnormality including hemorrhage, infarct, mass lesion, mass effect, midline shift or abnormal extra-axial fluid collection cortical atrophy and chronic microvascular ischemic change are identified. There is no hydrocephalus or pneumocephalus. The calvarium is intact.  IMPRESSION: No acute abnormality.   Electronically Signed   By: Inge Rise M.D.   On: 12/12/2014 18:57   Ct Cervical Spine Wo Contrast  12/12/2014   CLINICAL DATA:  Status post fall 1 week ago. Altered mental status. Turning the head to the right. Initial encounter.  EXAM: CT CERVICAL SPINE WITHOUT CONTRAST  TECHNIQUE: Multidetector CT imaging of the cervical spine was performed without intravenous contrast. Multiplanar CT image reconstructions were also generated.  COMPARISON:  None.  FINDINGS: No fracture or malalignment is identified. Loss of disc space height is seen at C5-6. Imaged paraspinous structures are unremarkable. Lung apices are clear.  IMPRESSION: No acute abnormality.   Electronically Signed   By: Inge Rise M.D.   On: 12/12/2014 19:56   Mr Brain Wo Contrast  12/13/2014   CLINICAL DATA:  64 year old hypertensive diabetic male with hyperlipidemia presenting with altered mental status. End-stage renal disease on dialysis. Subsequent encounter.  EXAM: MRI HEAD WITHOUT CONTRAST  TECHNIQUE: Multiplanar, multiecho pulse sequences of the brain and surrounding structures were obtained without intravenous contrast.  COMPARISON:  12/12/2014 head CT.  No comparison brain MR.  FINDINGS: Examination is limited to four motion degraded sequences.  No acute infarct.  Global atrophy without hydrocephalus.   No obvious intracranial hemorrhage.  No intracranial mass lesion noted on this motion degraded limited exam.  Limited for evaluation for the possibility of encephalopathy.  Major intracranial vascular structures appear patent.  Osseous changes of renal disease.  IMPRESSION: Significantly limited exam without evidence of acute infarct.  Global atrophy.  Please see above.   Electronically Signed   By: Genia Del M.D.   On: 12/13/2014 08:52    Assessment: 64 y.o. male with a known history of end-stage renal disease on dialysis, hypertension, diabetes, hyperlipidemia and heart disease. Admitted with confusion/disorientation.   There was suspicion of seizure activity and pt was started on Keppra 500 BID.  Appears more alert as states name, month and the name of hospital.  EEG done shows generalized slowing.    Mental status appears to have improved. ? Metabolic  -  con't hydration - con't Keppra  - observation.  - No further imaging from Neuro stand point.     12/14/2014, 3:54 PM

## 2014-12-14 NOTE — Evaluation (Signed)
Physical Therapy Evaluation Patient Details Name: [name] MRN: [id number] DOB: 10-10-1950 Today's Date: 12/14/2014   History of Present Illness  [name] that presents with altered mental status, lethargy, and vomited x 3. Patient noted to have "jumping" of arms and legs. No evidence of CVA or seizure via imaging on this admission.   Clinical Impression  [name] that presents from home with AMS. Patient has difficulty following visual, verbal, or physical cues today even with interpreter assistance. Patient speaks [place] at home, and has presented to this facility previously. Patient is unable to provide much assistance with supine to sit transfer and is unable to hold himself in sitting balance without max A x1. Patient is significantly below his physical baseline as he had previously been working on ambulating with home health PT. Patient would benefit from skilled acute PT services to address his mobility deficits.     Follow Up Recommendations SNF    Equipment Recommendations       Recommendations for Other Services       Precautions / Restrictions Precautions Precautions: Fall Restrictions Weight Bearing Restrictions: No      Mobility  Bed Mobility Overal bed mobility: Needs Assistance Bed Mobility: Supine to Sit     Supine to sit: Max assist     General bed mobility comments: Patient unable to follow commands for finding bedrail and did not provide significant effort to get to EOB, though he initially began to bridge to bring his legs to the side.   Transfers                    Ambulation/Gait                Stairs            Wheelchair Mobility    Modified Rankin (Stroke Patients Only)       Balance Overall balance assessment: Needs assistance   Sitting balance-Leahy Scale: Zero Sitting balance - Comments: Unable to sit without max A x1 secondary to posterior lean. Would not follow commands to bring his  head/trunk forward. Physical, visual, and verbal cuing attempted.  Postural control: Posterior lean                                   Pertinent Vitals/Pain Pain Assessment:  (States he has pain in his shoulders and lower back. )    Home Living Family/patient expects to be discharged to:: Skilled nursing facility Living Arrangements: Spouse/significant other;Children               Additional Comments: Patient unable to provide any comments on home set up.     Prior Function           Comments: Unable to determine due to lethargy/altered mental status.      Hand Dominance        Extremity/Trunk Assessment   Upper Extremity Assessment: Difficult to assess due to impaired cognition           Lower Extremity Assessment: Difficult to assess due to impaired cognition         Communication   Communication: Prefers language other than English (Speaks [place]. )  Cognition Arousal/Alertness: Lethargic Behavior During Therapy: Flat affect Overall Cognitive Status: Impaired/Different from baseline Area of Impairment: Attention;Memory;Orientation;Awareness;Following commands  General Comments: Patient does give some yes/no answers, but has difficulty following commands consistently even with interpreter. Unclear if this was due to physical limitations or language barrier, though interpreter was used.     General Comments      Exercises        Assessment/Plan    PT Assessment Patient needs continued PT services  PT Diagnosis Difficulty walking;Altered mental status;Abnormality of gait;Generalized weakness   PT Problem List Decreased strength;Cardiopulmonary status limiting activity;Decreased cognition;Decreased knowledge of use of DME;Decreased balance;Decreased mobility  PT Treatment Interventions DME instruction;Gait training;Balance training;Therapeutic activities;Therapeutic exercise;Patient/family education   PT Goals  (Current goals can be found in the Care Plan section) Acute Rehab PT Goals Patient Stated Goal: Patient unable to provide any goals.  Time For Goal Achievement: 12/28/14 Potential to Achieve Goals: Fair    Frequency Min 2X/week   Barriers to discharge   Unable to determine.     Co-evaluation               End of Session   Activity Tolerance: Patient limited by lethargy Patient left: in bed;with call bell/phone within reach;with bed alarm set Nurse Communication: Mobility status         Time: [id number] PT Time Calculation (min) (ACUTE ONLY): 21 min   Charges:   PT Evaluation $Initial PT Evaluation Tier I: 1 Procedure     PT G Codes:       [name], PT, DPT   12/14/2014, 4:05 PM

## 2014-12-14 NOTE — Progress Notes (Signed)
Subjective:   More awake today Able to answer simple questions  Objective:  Vital signs in last 24 hours:  Temp:  [97.7 F (36.5 C)-98 F (36.7 C)] 97.7 F (36.5 C) (06/29 0807) Pulse Rate:  [54-76] 63 (06/29 0807) Resp:  [12-21] 16 (06/29 0807) BP: (137-173)/(64-82) 173/66 mmHg (06/29 0807) SpO2:  [100 %] 100 % (06/29 0807) Weight:  [49.2 kg (108 lb 7.5 oz)] 49.2 kg (108 lb 7.5 oz) (06/28 1516)  Weight change: -8.798 kg (-19 lb 6.3 oz) Filed Weights   12/13/14 0010 12/13/14 0957 12/13/14 1516  Weight: 49.351 kg (108 lb 12.8 oz) 48.9 kg (107 lb 12.9 oz) 49.2 kg (108 lb 7.5 oz)    Intake/Output: I/O last 3 completed shifts: In: 838.4 [I.V.:288.4; IV Piggyback:550] Out: 500 [Other:500]     Physical Exam: General: NAD, laying in bed  HEENT Eyes closed  Neck supple  Pulm/lungs Normal resp effort, no crackles or wheezing  CVS/Heart Regular, no rub  Abdomen:  Soft, non tender  Extremities: No edema  Neurologic: Awake, answers simple questions  Skin: No acute rashes  Access:        Basic Metabolic Panel:  Recent Labs Lab 12/12/14 1911 12/13/14 1155  NA 141 141  K 3.5 3.6  CL 97* 100*  CO2 32 33*  GLUCOSE 177* 92  BUN 17 22*  CREATININE 7.93* 9.31*  CALCIUM 11.3* 10.5*     CBC:  Recent Labs Lab 12/12/14 1911 12/13/14 1155  WBC 8.8 5.7  NEUTROABS 5.8  --   HGB 9.0* 7.1*  HCT 28.6* 22.2*  MCV 75.1* 74.1*  PLT 220 155      Microbiology: Results for orders placed or performed during the hospital encounter of 12/12/14  Culture, blood (routine x 2)     Status: None (Preliminary result)   Collection Time: 12/12/14  9:05 PM  Result Value Ref Range Status   Specimen Description BLOOD  Final   Special Requests Normal  Final   Culture NO GROWTH < 12 HOURS  Final   Report Status PENDING  Incomplete    Coagulation Studies: No results for input(s): LABPROT, INR in the last 72 hours.  Urinalysis: No results for input(s): COLORURINE, LABSPEC,  PHURINE, GLUCOSEU, HGBUR, BILIRUBINUR, KETONESUR, PROTEINUR, UROBILINOGEN, NITRITE, LEUKOCYTESUR in the last 72 hours.  Invalid input(s): APPERANCEUR    Imaging: Ct Abdomen Pelvis Wo Contrast  12/12/2014   CLINICAL DATA:  Altered mental status.  Vomiting.  EXAM: CT ABDOMEN AND PELVIS WITHOUT CONTRAST  TECHNIQUE: Multidetector CT imaging of the abdomen and pelvis was performed following the standard protocol without IV contrast.  COMPARISON:  10/07/2014  FINDINGS: Lower chest:  Small bilateral pleural effusions are noted.  Hepatobiliary: No suspicious liver abnormality. No gallstones. No biliary dilatation.  Pancreas: Negative  Spleen: Negative  Adrenals/Urinary Tract: The adrenal glands are normal. Bilateral renal calculi are noted. The largest stone is in the inferior pole of the right kidney measuring 5 mm, image 28/series 2. The kidneys are atrophic and there are small bilateral renal cysts. These are incompletely characterized without IV contrast. The urinary bladder appears collapsed.  Stomach/Bowel: The stomach is normal. The small bowel loops have a normal course and caliber. The appendix is visualized and appears normal. There is numerous colonic diverticula. The transverse colon through the distal descending colon appears collapsed. Sigmoid colon and rectum are unremarkable.  Vascular/Lymphatic: Aortic atherosclerosis noted. No aneurysm. No upper abdominal adenopathy. No pelvic or inguinal adenopathy.  Reproductive: The prostate gland and seminal  vesicles are unremarkable.  Other: Again noted are postoperative changes within the right lower quadrant of the abdomen, image 50 of series 2.  Likely related to prior renal graft. Left inguinal hernia is noted. Fluid is identified within the distal left inguinal canal. New from previous exam.  Musculoskeletal: Changes of renal osteodystrophy noted.  IMPRESSION: 1. There are no acute findings identified within the abdomen or pelvis. No evidence for bowel  obstruction to explain patient's vomiting. 2. Aortic atherosclerosis. 3. Bilateral end-stage kidneys. 4. Colonic diverticulosis. 5. Small bilateral pleural effusions.   Electronically Signed   By: Kerby Moors M.D.   On: 12/12/2014 19:01   Dg Chest 1 View  12/12/2014   CLINICAL DATA:  Altered mental status and vomiting.  EXAM: CHEST  1 VIEW  COMPARISON:  11/05/2014  FINDINGS: Mild atelectasis present at both lung bases. There is no evidence of pulmonary edema, consolidation, pneumothorax, nodule or pleural fluid. The aorta is ectatic. The heart size is at the upper limits of normal.  IMPRESSION: Bibasilar atelectasis.  No acute findings.   Electronically Signed   By: Aletta Edouard M.D.   On: 12/12/2014 19:00   Ct Head Wo Contrast  12/12/2014   CLINICAL DATA:  Altered mental status today.  EXAM: CT HEAD WITHOUT CONTRAST  TECHNIQUE: Contiguous axial images were obtained from the base of the skull through the vertex without intravenous contrast.  COMPARISON:  Head CT scan 11/05/2014.  FINDINGS: There is no evidence of acute intracranial abnormality including hemorrhage, infarct, mass lesion, mass effect, midline shift or abnormal extra-axial fluid collection cortical atrophy and chronic microvascular ischemic change are identified. There is no hydrocephalus or pneumocephalus. The calvarium is intact.  IMPRESSION: No acute abnormality.   Electronically Signed   By: Inge Rise M.D.   On: 12/12/2014 18:57   Ct Cervical Spine Wo Contrast  12/12/2014   CLINICAL DATA:  Status post fall 1 week ago. Altered mental status. Turning the head to the right. Initial encounter.  EXAM: CT CERVICAL SPINE WITHOUT CONTRAST  TECHNIQUE: Multidetector CT imaging of the cervical spine was performed without intravenous contrast. Multiplanar CT image reconstructions were also generated.  COMPARISON:  None.  FINDINGS: No fracture or malalignment is identified. Loss of disc space height is seen at C5-6. Imaged paraspinous  structures are unremarkable. Lung apices are clear.  IMPRESSION: No acute abnormality.   Electronically Signed   By: Inge Rise M.D.   On: 12/12/2014 19:56   Mr Brain Wo Contrast  12/13/2014   CLINICAL DATA:  64 year old hypertensive diabetic male with hyperlipidemia presenting with altered mental status. End-stage renal disease on dialysis. Subsequent encounter.  EXAM: MRI HEAD WITHOUT CONTRAST  TECHNIQUE: Multiplanar, multiecho pulse sequences of the brain and surrounding structures were obtained without intravenous contrast.  COMPARISON:  12/12/2014 head CT.  No comparison brain MR.  FINDINGS: Examination is limited to four motion degraded sequences.  No acute infarct.  Global atrophy without hydrocephalus.  No obvious intracranial hemorrhage.  No intracranial mass lesion noted on this motion degraded limited exam.  Limited for evaluation for the possibility of encephalopathy.  Major intracranial vascular structures appear patent.  Osseous changes of renal disease.  IMPRESSION: Significantly limited exam without evidence of acute infarct.  Global atrophy.  Please see above.   Electronically Signed   By: Genia Del M.D.   On: 12/13/2014 08:52     Medications:   . dextrose 5 % and 0.45% NaCl 42 mL/hr at 12/13/14 1749   .  aspirin  300 mg Rectal Daily  . cefTRIAXone (ROCEPHIN)  IV  1 g Intravenous Q24H  . heparin  5,000 Units Subcutaneous 3 times per day  . insulin aspart  0-5 Units Subcutaneous QHS  . insulin aspart  0-9 Units Subcutaneous TID WC  . levETIRAcetam  500 mg Intravenous Q12H  . sodium chloride  3 mL Intravenous Q12H   acetaminophen **OR** acetaminophen, labetalol  Assessment/ Plan:  64 y.o. 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 End Stage Renal Disease: kidney transplant 2004 status post transplant  nephrectomy 04/2014 - Next HD on thursday   2. SHPTH: monitor phos   Hyperkalemia noted.   3. Anemia of CKD - hgb 7.1, continue epogen with HD.   4. Altered mental status. - Infection and seizure work up is in progress - more awake today   LOS: 2 Hansini Clodfelter 6/29/201611:25 AM

## 2014-12-14 NOTE — Progress Notes (Signed)
Subjective:   Patient is awake and alert today. Responds appropriately to verbal commands. Answers simple yes/no questions. Language barrier. Nurse at bedside.  Objective:   Vital signs in last 24 hours: Temp:  [97.6 F (36.4 C)-98 F (36.7 C)] 97.7 F (36.5 C) (06/29 0807) Pulse Rate:  [54-76] 63 (06/29 0807) Resp:  [12-22] 16 (06/29 0807) BP: (137-173)/(64-82) 173/66 mmHg (06/29 0807) SpO2:  [100 %] 100 % (06/29 0807) Weight:  [48.9 kg (107 lb 12.9 oz)-49.2 kg (108 lb 7.5 oz)] 49.2 kg (108 lb 7.5 oz) (06/28 1516) Weight change: -8.798 kg (-19 lb 6.3 oz) Last BM Date: 12/13/14  Intake/Output from previous day: 06/28 0701 - 06/29 0700 In: 538.4 [I.V.:288.4; IV Piggyback:250] Out: 500  Intake/Output this shift:    Exam:  General: Awake and alert, NAD HENT: no JVD, no pallor, no icterus Chest: decreased air entry at both bases, clear to auscultation, no rhonchi or crackles. CVS: RRR, S1S2, no tachycardia. Abdomen: soft, nontender, no mass. Ext: no lower leg edema.  Neuro: moving bilateral upper and lower extremities.  Lab Results:  Recent Labs  12/12/14 1911 12/13/14 1155  WBC 8.8 5.7  HGB 9.0* 7.1*  HCT 28.6* 22.2*  PLT 220 155   BMET  Recent Labs  12/12/14 1911 12/13/14 1155  NA 141 141  K 3.5 3.6  CL 97* 100*  CO2 32 33*  GLUCOSE 177* 92  BUN 17 22*  CREATININE 7.93* 9.31*  CALCIUM 11.3* 10.5*    Studies/Results: Ct Abdomen Pelvis Wo Contrast  12/12/2014   CLINICAL DATA:  Altered mental status.  Vomiting.  EXAM: CT ABDOMEN AND PELVIS WITHOUT CONTRAST  TECHNIQUE: Multidetector CT imaging of the abdomen and pelvis was performed following the standard protocol without IV contrast.  COMPARISON:  10/07/2014  FINDINGS: Lower chest:  Small bilateral pleural effusions are noted.  Hepatobiliary: No suspicious liver abnormality. No gallstones. No biliary dilatation.  Pancreas: Negative  Spleen: Negative  Adrenals/Urinary Tract: The adrenal glands are normal.  Bilateral renal calculi are noted. The largest stone is in the inferior pole of the right kidney measuring 5 mm, image 28/series 2. The kidneys are atrophic and there are small bilateral renal cysts. These are incompletely characterized without IV contrast. The urinary bladder appears collapsed.  Stomach/Bowel: The stomach is normal. The small bowel loops have a normal course and caliber. The appendix is visualized and appears normal. There is numerous colonic diverticula. The transverse colon through the distal descending colon appears collapsed. Sigmoid colon and rectum are unremarkable.  Vascular/Lymphatic: Aortic atherosclerosis noted. No aneurysm. No upper abdominal adenopathy. No pelvic or inguinal adenopathy.  Reproductive: The prostate gland and seminal vesicles are unremarkable.  Other: Again noted are postoperative changes within the right lower quadrant of the abdomen, image 50 of series 2.  Likely related to prior renal graft. Left inguinal hernia is noted. Fluid is identified within the distal left inguinal canal. New from previous exam.  Musculoskeletal: Changes of renal osteodystrophy noted.  IMPRESSION: 1. There are no acute findings identified within the abdomen or pelvis. No evidence for bowel obstruction to explain patient's vomiting. 2. Aortic atherosclerosis. 3. Bilateral end-stage kidneys. 4. Colonic diverticulosis. 5. Small bilateral pleural effusions.   Electronically Signed   By: Kerby Moors M.D.   On: 12/12/2014 19:01   Dg Chest 1 View  12/12/2014   CLINICAL DATA:  Altered mental status and vomiting.  EXAM: CHEST  1 VIEW  COMPARISON:  11/05/2014  FINDINGS: Mild atelectasis present at both lung  bases. There is no evidence of pulmonary edema, consolidation, pneumothorax, nodule or pleural fluid. The aorta is ectatic. The heart size is at the upper limits of normal.  IMPRESSION: Bibasilar atelectasis.  No acute findings.   Electronically Signed   By: Aletta Edouard M.D.   On: 12/12/2014  19:00   Ct Head Wo Contrast  12/12/2014   CLINICAL DATA:  Altered mental status today.  EXAM: CT HEAD WITHOUT CONTRAST  TECHNIQUE: Contiguous axial images were obtained from the base of the skull through the vertex without intravenous contrast.  COMPARISON:  Head CT scan 11/05/2014.  FINDINGS: There is no evidence of acute intracranial abnormality including hemorrhage, infarct, mass lesion, mass effect, midline shift or abnormal extra-axial fluid collection cortical atrophy and chronic microvascular ischemic change are identified. There is no hydrocephalus or pneumocephalus. The calvarium is intact.  IMPRESSION: No acute abnormality.   Electronically Signed   By: Inge Rise M.D.   On: 12/12/2014 18:57   Ct Cervical Spine Wo Contrast  12/12/2014   CLINICAL DATA:  Status post fall 1 week ago. Altered mental status. Turning the head to the right. Initial encounter.  EXAM: CT CERVICAL SPINE WITHOUT CONTRAST  TECHNIQUE: Multidetector CT imaging of the cervical spine was performed without intravenous contrast. Multiplanar CT image reconstructions were also generated.  COMPARISON:  None.  FINDINGS: No fracture or malalignment is identified. Loss of disc space height is seen at C5-6. Imaged paraspinous structures are unremarkable. Lung apices are clear.  IMPRESSION: No acute abnormality.   Electronically Signed   By: Inge Rise M.D.   On: 12/12/2014 19:56   Mr Brain Wo Contrast  12/13/2014   CLINICAL DATA:  64 year old hypertensive diabetic male with hyperlipidemia presenting with altered mental status. End-stage renal disease on dialysis. Subsequent encounter.  EXAM: MRI HEAD WITHOUT CONTRAST  TECHNIQUE: Multiplanar, multiecho pulse sequences of the brain and surrounding structures were obtained without intravenous contrast.  COMPARISON:  12/12/2014 head CT.  No comparison brain MR.  FINDINGS: Examination is limited to four motion degraded sequences.  No acute infarct.  Global atrophy without  hydrocephalus.  No obvious intracranial hemorrhage.  No intracranial mass lesion noted on this motion degraded limited exam.  Limited for evaluation for the possibility of encephalopathy.  Major intracranial vascular structures appear patent.  Osseous changes of renal disease.  IMPRESSION: Significantly limited exam without evidence of acute infarct.  Global atrophy.  Please see above.   Electronically Signed   By: Genia Del M.D.   On: 12/13/2014 08:52    Medications: I have reviewed the patient's current medications.  Marland Kitchen aspirin  300 mg Rectal Daily  . cefTRIAXone (ROCEPHIN)  IV  1 g Intravenous Q24H  . heparin  5,000 Units Subcutaneous 3 times per day  . insulin aspart  0-5 Units Subcutaneous QHS  . insulin aspart  0-9 Units Subcutaneous TID WC  . levETIRAcetam  500 mg Intravenous Q12H  . sodium chloride  3 mL Intravenous Q12H     Assessment and Plan:    LOS: 2 days   Acute encephalopathy: likely metabolic, improved. Mental status is close to baseline today. No acute findings on MRI. No leukocytosis or left shift. Afebrile. Blood cultures were sent, negative at 12 hours, continue empiric Vancomycin and Rocephin. Possible seizure activity, given history of shaking, shall continue Keppra, EEG pending. Neurology evaluation.   Anemia: drop in Hb to 7.1, monitor for active bleeding, FOBT, repeat H/H today and transfuse prn. End-stage renal disease on hemodialysis: closely  followed by nephrology. Hypertension: resume usual anti-hypertensive medications, monitor. Type 2 diabetes: shall monitor, continue sliding scale insulin. Hyperlipidemia: statin on hold for now.  Gastroesophageal reflux: resume PPI. Depression: shall resume Celexa.   Jessabelle Markiewicz, MD 12/14/2014, 9:13 AM

## 2014-12-14 NOTE — Evaluation (Signed)
Clinical/Bedside Swallow Evaluation Patient Details  Name: [name] MRN: [id number] Date of Birth: March 26, 1951  Today's Date: 12/14/2014 Time: SLP Start Time (ACUTE ONLY): 57 SLP Stop Time (ACUTE ONLY): 1251 SLP Time Calculation (min) (ACUTE ONLY): 31 min  Past Medical History:  Past Medical History  Diagnosis Date  . Chronic disease anemia   . Hypertension   . High cholesterol   . Type II diabetes mellitus   . History of blood transfusion     "related to anemia"  . GERD (gastroesophageal reflux disease)   . History of stomach ulcers   . Gout   . ESRD (end stage renal disease) on dialysis     "[facility] [place]; [name]" (09/29/2014)  . Chronic diastolic congestive heart failure    Past Surgical History:  Past Surgical History  Procedure Laterality Date  . Kidney transplant Right 2004  . Peritoneal catheter insertion  02/2014  . Peritoneal catheter removal  08/2014  . Arteriovenous graft placement Left ~ 1996  . Thrombectomy / arteriovenous graft revision  2015  . Nephrectomy transplanted organ  2015  . Cardiac catheterization  09/29/2014    "[place]"  . Percutaneous coronary stent intervention (pci-s) N/A 09/30/2014    Procedure: PERCUTANEOUS CORONARY STENT INTERVENTION (PCI-S);  Surgeon: [name], MD;  Location: [facility] CATH LAB;  Service: Cardiovascular;  Laterality: N/A;   HPI:      Assessment / Plan / Recommendation Clinical Impression  pt presents with mild to moderate oral dysphagia charactorized by difficulty with masticaation, increased ap transit and decreased bolus formation. pt with delayed cough post intake of regular trial. pt with no overt ssx aspiration noted with thin liquids with straw. st reommends rndds2 with thin liquids at this time.     Aspiration Risk  Mild    Diet Recommendation Dysphagia 2 (Fine chop);Thin   Medication Administration: Crushed with puree    Other  Recommendations Oral Care Recommendations: Patient independent with oral care    Follow Up Recommendations       Frequency and Duration min 3x week  1 week   Pertinent Vitals/Pain None reported    SLP Swallow Goals     Swallow Study Prior Functional Status       General Date of Onset: 12/13/14 Type of Study: Bedside swallow evaluation Diet Prior to this Study: NPO Temperature Spikes Noted: N/A Respiratory Status: Room air History of Recent Intubation: No Behavior/Cognition: Alert;Cooperative;Pleasant mood Oral Cavity - Dentition: Adequate natural dentition/normal for age Self-Feeding Abilities: Needs assist Baseline Vocal Quality: Normal Volitional Cough: Strong    Oral/Motor/Sensory Function Overall Oral Motor/Sensory Function: Appears within functional limits for tasks assessed Labial Strength: Within Functional Limits Lingual Strength: Within Functional Limits   Ice Chips Ice chips: Not tested   Thin Liquid Thin Liquid: Within functional limits Presentation: Cup;Straw    Nectar Thick Nectar Thick Liquid: Not tested   Honey Thick Honey Thick Liquid: Not tested   Puree Puree: Within functional limits   Solid   GO    Solid: Impaired Pharyngeal Phase Impairments: Throat Clearing - Immediate;Cough - Immediate Other Comments: pt with no overt ssx aspiration with semi solid trials however difficulty with mastication of regular trials.       Haiden Clucas Harris Sauber 12/14/2014,3:35 PM

## 2014-12-14 NOTE — Care Management (Signed)
Important Message  Patient Details  Name: [name] MRN: [id number] Date of Birth: Feb 20, 1951   Medicare Important Message Given:  Yes-second notification given    [name] 12/14/2014, 11:58 AM

## 2014-12-15 LAB — CBC
HCT: 24.3 % — ABNORMAL LOW (ref 40.0–52.0)
Hemoglobin: 7.7 g/dL — ABNORMAL LOW (ref 13.0–18.0)
MCH: 23.8 pg — ABNORMAL LOW (ref 26.0–34.0)
MCHC: 31.9 g/dL — AB (ref 32.0–36.0)
MCV: 74.6 fL — ABNORMAL LOW (ref 80.0–100.0)
Platelets: 170 10*3/uL (ref 150–440)
RBC: 3.26 MIL/uL — ABNORMAL LOW (ref 4.40–5.90)
RDW: 22.8 % — ABNORMAL HIGH (ref 11.5–14.5)
WBC: 6 10*3/uL (ref 3.8–10.6)

## 2014-12-15 LAB — GLUCOSE, CAPILLARY
GLUCOSE-CAPILLARY: 74 mg/dL (ref 65–99)
Glucose-Capillary: 81 mg/dL (ref 65–99)
Glucose-Capillary: 99 mg/dL (ref 65–99)
Glucose-Capillary: 103 mg/dL — ABNORMAL HIGH (ref 65–99)

## 2014-12-15 MED ORDER — EPOETIN ALFA 10000 UNIT/ML IJ SOLN
10000.0000 [IU] | Freq: Once | INTRAMUSCULAR | Status: AC
  Administered 2014-12-15: 10000 [IU] via INTRAVENOUS

## 2014-12-15 MED ORDER — NEPRO/CARBSTEADY PO LIQD
237.0000 mL | Freq: Two times a day (BID) | ORAL | Status: DC
Start: 2014-12-15 — End: 2014-12-16
  Administered 2014-12-15 – 2014-12-16 (×2): 237 mL via ORAL

## 2014-12-15 NOTE — Progress Notes (Signed)
Post hd note

## 2014-12-15 NOTE — Progress Notes (Signed)
Pre-hd tx 

## 2014-12-15 NOTE — Progress Notes (Signed)
HD tx start 

## 2014-12-15 NOTE — Progress Notes (Signed)
Subjective:   More awake today, ate breakfast Able to answer simple questions  Objective:  Vital signs in last 24 hours:  Temp:  [98 F (36.7 C)-98.5 F (36.9 C)] 98.4 F (36.9 C) (06/30 0915) Pulse Rate:  [60-71] 60 (06/30 0930) Resp:  [18-20] 20 (06/30 0930) BP: (161-181)/(58-77) 181/77 mmHg (06/30 0930) SpO2:  [97 %-100 %] 100 % (06/30 0915) Weight:  [50.9 kg (112 lb 3.4 oz)] 50.9 kg (112 lb 3.4 oz) (06/30 0915)  Weight change:  Filed Weights   12/13/14 0957 12/13/14 1516 12/15/14 0915  Weight: 48.9 kg (107 lb 12.9 oz) 49.2 kg (108 lb 7.5 oz) 50.9 kg (112 lb 3.4 oz)    Intake/Output: I/O last 3 completed shifts: In: 2129.4 [I.V.:1879.4; IV Piggyback:250] Out: 0      Physical Exam: General: NAD, laying in bed  HEENT anicteric  Neck supple  Pulm/lungs Normal resp effort, no crackles or wheezing  CVS/Heart Regular, no rub  Abdomen:  Soft, non tender  Extremities: No edema  Neurologic: Awake, answers simple questions  Skin: No acute rashes  Access:        Basic Metabolic Panel:  Recent Labs Lab 12/12/14 1911 12/13/14 1155 12/14/14 1015  NA 141 141 140  K 3.5 3.6 3.9  CL 97* 100* 101  CO2 32 33* 30  GLUCOSE 177* 92 86  BUN 17 22* 10  CREATININE 7.93* 9.31* 4.97*  CALCIUM 11.3* 10.5* 8.5*     CBC:  Recent Labs Lab 12/12/14 1911 12/13/14 1155 12/14/14 1015 12/15/14 0600  WBC 8.8 5.7 6.2 6.0  NEUTROABS 5.8  --  1.9  --   HGB 9.0* 7.1* 7.5* 7.7*  HCT 28.6* 22.2* 23.9* 24.3*  MCV 75.1* 74.1* 74.6* 74.6*  PLT 220 155 162 170      Microbiology: Results for orders placed or performed during the hospital encounter of 12/12/14  Culture, blood (routine x 2)     Status: None (Preliminary result)   Collection Time: 12/12/14  9:05 PM  Result Value Ref Range Status   Specimen Description BLOOD  Final   Special Requests Normal  Final   Culture NO GROWTH 2 DAYS  Final   Report Status PENDING  Incomplete    Coagulation Studies: No results for  input(s): LABPROT, INR in the last 72 hours.  Urinalysis: No results for input(s): COLORURINE, LABSPEC, PHURINE, GLUCOSEU, HGBUR, BILIRUBINUR, KETONESUR, PROTEINUR, UROBILINOGEN, NITRITE, LEUKOCYTESUR in the last 72 hours.  Invalid input(s): APPERANCEUR    Imaging: No results found.   Medications:   . dextrose 5 % and 0.45% NaCl 42 mL/hr at 12/14/14 1229   . aspirin  300 mg Rectal Daily  . cefTRIAXone (ROCEPHIN)  IV  1 g Intravenous Q24H  . epoetin (EPOGEN/PROCRIT) injection  10,000 Units Intravenous Once  . heparin  5,000 Units Subcutaneous 3 times per day  . insulin aspart  0-5 Units Subcutaneous QHS  . insulin aspart  0-9 Units Subcutaneous TID WC  . levETIRAcetam  500 mg Intravenous Q12H  . sodium chloride  3 mL Intravenous Q12H   acetaminophen **OR** acetaminophen, labetalol  Assessment/ Plan:  64 y.o. 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 End Stage Renal Disease: kidney transplant 2004 status post transplant nephrectomy 04/2014 - Patient seen during dialysis Tolerating well   2. SHPTH: monitor phos   Calcium level has improved  3. Anemia of CKD - hgb 7.7, continue epogen with HD.   4. Altered mental status. - Infection and seizure work up is in progress - more awake today, improved   LOS: 3 [name] 6/30/201610:03 AM

## 2014-12-15 NOTE — Progress Notes (Signed)
Nutrition Follow-up       INTERVENTION:   (Nutrition Supplement Therapy: ) Recommend nepro BID for added nutrition Meals and snacks: cater to pt prefences  NUTRITION DIAGNOSIS:  Inadequate oral intake related to lethargy/confusion, acute illness as evidenced by NPO status, improving .    GOAL:  Patient will meet greater than or equal to 90% of their needs    MONITOR:   (Energy intake, Anthropometric, Electrolyte and renal profile)  REASON FOR ASSESSMENT:  Malnutrition Screening Tool    ASSESSMENT:  Pt s/p HD today.  Diet Order: dysphagia 2  Current Nutrition: ate most of lunch today but left little bit per pt, breakfast about the same. Limited documentation of intake per I and O sheet    Last BM:6/28   Medications: reviewed  Electrolyte/Renal Profile and Glucose Profile:   Recent Labs Lab 12/12/14 1911 12/13/14 1155 12/14/14 1015  NA 141 141 140  K 3.5 3.6 3.9  CL 97* 100* 101  CO2 32 33* 30  BUN 17 22* 10  CREATININE 7.93* 9.31* 4.97*  CALCIUM 11.3* 10.5* 8.5*  GLUCOSE 177* 92 86   Protein Profile:  Recent Labs Lab 12/12/14 1911  ALBUMIN 3.4*       Weight Trend since Admission: Filed Weights   12/13/14 0957 12/13/14 1516 12/15/14 0915  Weight: 107 lb 12.9 oz (48.9 kg) 108 lb 7.5 oz (49.2 kg) 112 lb 3.4 oz (50.9 kg)      Height:  Ht Readings from Last 1 Encounters:  12/13/14 5\' 1"  (1.549 m)    Weight:  Wt Readings from Last 1 Encounters:  12/15/14 112 lb 3.4 oz (50.9 kg)   12% weight loss since April per last encounters   Wt Readings from Last 10 Encounters:  12/15/14 112 lb 3.4 oz (50.9 kg)  12/13/14 108 lb (48.988 kg)  11/09/14 117 lb 12.8 oz (53.434 kg)  10/01/14 128 lb 12 oz (58.4 kg)    BMI:  Body mass index is 21.21 kg/(m^2).  Estimated Nutritional Needs:  Kcal:  BEE 1148 kcals (IF 1.0-1.3, AF 1.3) [id number] kcals/d   Protein:  (1.2-1.5 g/d) 59-74g/d  Fluid:  (1022ml + UOP)  Skin:  Reviewed, no  issues  Diet Order:  DIET DYS 2 Room service appropriate?: Yes; Fluid consistency:: Thin  EDUCATION NEEDS:  No education needs identified at this time   Intake/Output Summary (Last 24 hours) at 12/15/14 1513 Last data filed at 12/15/14 1320  Gross per 24 hour  Intake    567 ml  Output   1000 ml  Net   -433 ml    MODERATE Care Level   Bluford Sedler B. Zenia Resides, [place], [place] (pager)

## 2014-12-15 NOTE — Progress Notes (Signed)
SLP Cancellation Note  Patient Details Name: [name] MRN: [id number] DOB: 01/06/1951   Cancelled treatment:       Reason Eval/Treat Not Completed: Patient at procedure or test/unavailable Pt is out of room for dialysis, will f/u re: toleration of diet in 1-2 days.    [name] 12/15/2014, 1:36 PM

## 2014-12-15 NOTE — Progress Notes (Signed)
Subjective:   Patient is alert, oriented and making conversation today. Responds appropriately to verbal commands.  Objective:   Vital signs in last 24 hours: Temp:  [98 F (36.7 C)-98.5 F (36.9 C)] 98.4 F (36.9 C) (06/30 0742) Pulse Rate:  [64-71] 66 (06/30 0742) Resp:  [18-20] 18 (06/30 0742) BP: (161-175)/(58-75) 173/58 mmHg (06/30 0742) SpO2:  [97 %-100 %] 100 % (06/30 0742) Weight change:  Last BM Date: 12/13/14  Intake/Output from previous day: 06/29 0701 - 06/30 0700 In: 1691 [I.V.:1591; IV Piggyback:100] Out: 0  Intake/Output this shift:    Exam:  General: Awake and alert, NAD HENT: no JVD, no icterus Chest: clear to auscultation, no rhonchi or crackles. CVS: RRR, S1S2, no tachycardia. Abdomen: soft, nontender, no mass. Ext: no lower leg edema.  Neuro: moving bilateral upper and lower extremities.  Lab Results:  Recent Labs  12/14/14 1015 12/15/14 0600  WBC 6.2 6.0  HGB 7.5* 7.7*  HCT 23.9* 24.3*  PLT 162 170   BMET  Recent Labs  12/13/14 1155 12/14/14 1015  NA 141 140  K 3.6 3.9  CL 100* 101  CO2 33* 30  GLUCOSE 92 86  BUN 22* 10  CREATININE 9.31* 4.97*  CALCIUM 10.5* 8.5*    Studies/Results: No results found.  Medications: I have reviewed the patient's current medications.  Marland Kitchen aspirin  300 mg Rectal Daily  . cefTRIAXone (ROCEPHIN)  IV  1 g Intravenous Q24H  . heparin  5,000 Units Subcutaneous 3 times per day  . insulin aspart  0-5 Units Subcutaneous QHS  . insulin aspart  0-9 Units Subcutaneous TID WC  . levETIRAcetam  500 mg Intravenous Q12H  . sodium chloride  3 mL Intravenous Q12H     Assessment and Plan:    LOS: 3 days   Acute encephalopathy: likely metabolic, improved. Mental status is close to baseline. No acute findings on MRI. No leukocytosis or left shift. Afebrile. Blood cultures were sent, negative at 2 days, continue empiric Vancomycin and Rocephin. Possible seizure activity, given history of shaking, shall  continue Keppra, EEG and Neurology input noted.   Anemia: H/H stable, no active bleeding, FOBT. End-stage renal disease: hemodialysis today, closely followed by nephrology. Hypertension: hold anti-hypertensive medications for dialysis today, monitor and resume usual antihypertensives in AM. Type 2 diabetes: shall monitor, continue sliding scale insulin. Hyperlipidemia: statin on hold for now.  Gastroesophageal reflux: shall resume PPI. Depression: shall resume Celexa. Prepare for possible discharge in AM.  Home health and aid services.  Janelli Welling, MD 12/15/2014, 9:12 AM

## 2014-12-15 NOTE — Progress Notes (Signed)
HD tx completed.

## 2014-12-16 LAB — GLUCOSE, CAPILLARY
Glucose-Capillary: 90 mg/dL (ref 65–99)
Glucose-Capillary: 125 mg/dL — ABNORMAL HIGH (ref 65–99)

## 2014-12-16 MED ORDER — AMLODIPINE BESYLATE 5 MG PO TABS
5.0000 mg | ORAL_TABLET | Freq: Every day | ORAL | Status: DC
  Administered 2014-12-16: 5 mg via ORAL
  Filled 2014-12-16: qty 1

## 2014-12-16 NOTE — Progress Notes (Signed)
Physical Therapy Treatment Patient Details Name: [name] MRN: [id number] DOB: 12-03-1950 Today's Date: 12/16/2014    History of Present Illness [name] that presents with altered mental status, lethargy, and vomited x 3. Patient noted to have "jumping" of arms and legs. No evidence of CVA or seizure via imaging on this admission.     PT Comments    Although progress is slow, pt is progressing towards PT goals. Pt was able to perform bed mobility at mod assist for upper body support, and he can perform transfers with mod assist for lifting support. Pt sitting balance has also improved, but he still tends to drift posteriorly. In standing pt's balance is impaired, as he also tends to drift posteriorly. Primary physical deficits include: global weakness, decreased endurance, decreased balance, and possible cognitive impairments. Given the pt's history of falls within his home environment with current help, his own desire to participate, and his proven ability to progress, it remains the PTs recommendation that a SNF is the best location for pt's well-being. Should the family choose to take pt home with them, the following DME recommendations will be necessary for pt safety at home: sliding transfer board, wheelchair with anti-tippers, bedside commode, bath bench, and rolling walker. Should the family choose to take the pt home, he will need HHPT in order to combat his deficits and remain safe within home environment.    Follow Up Recommendations  SNF     Equipment Recommendations   (See clinical impression for DME recommendations)    Recommendations for Other Services       Precautions / Restrictions Precautions Precautions: Fall Restrictions Weight Bearing Restrictions: No    Mobility  Bed Mobility Overal bed mobility: Needs Assistance Bed Mobility: Supine to Sit     Supine to sit: Mod assist     General bed mobility comments: Pt performs bed mobility with assist  to raise upper body. He will provide effort and use his arms on siderails to assist. He is able to self-navigate his legs off of the bed. Visual cues with hands help the pt, although it was found that he does understand a decent amuont of english   Transfers Overall transfer level: Needs assistance Equipment used: Rolling walker (2 wheeled) Transfers: Sit to/from Stand Sit to Stand: Mod assist;From elevated surface         General transfer comment: Pt transfers with mod assist, elevated bed, and lots of verbal and visual cues to for hand placement and lifting chest. It was observed that pt tends to lean posteriorly but he will correct this if the therapist weight shifts him anterior into his walker.   Ambulation/Gait             General Gait Details: Not assessed today due to pt weakness    Stairs            Wheelchair Mobility    Modified Rankin (Stroke Patients Only)       Balance Overall balance assessment: History of Falls                                  Cognition Arousal/Alertness: Awake/alert Behavior During Therapy: Flat affect Overall Cognitive Status: Difficult to assess                      Exercises      General Comments  Pertinent Vitals/Pain Pain Assessment: No/denies pain    Home Living                      Prior Function            PT Goals (current goals can now be found in the care plan section) Acute Rehab PT Goals Patient Stated Goal: wishes to get stronger Time For Goal Achievement: 12/28/14 Potential to Achieve Goals: Fair Progress towards PT goals: Progressing toward goals    Frequency  Min 2X/week    PT Plan Current plan remains appropriate    Co-evaluation             End of Session Equipment Utilized During Treatment: Gait belt Activity Tolerance: Patient tolerated treatment well;Patient limited by fatigue Patient left: in bed;with call bell/phone within reach;with  family/visitor present;with bed alarm set     Time: [id number] PT Time Calculation (min) (ACUTE ONLY): 17 min  Charges:                       G CodesJanyth Contes December 29, 2014, 1:21 PM  Janyth Contes, SPT. [id number]

## 2014-12-16 NOTE — Progress Notes (Signed)
Speech Language Pathology Treatment: Dysphagia  Patient Details Name: [name] MRN: [id number] DOB: 1950/08/07 Today's Date: 12/16/2014 Time: [id number] SLP Time Calculation (min) (ACUTE ONLY): 12 min  Assessment / Plan / Recommendation Clinical Impression  Pt demonstrating toleration of Dysphagia II diet w/thin liquids. No overt s/s of aspiration were observed w/thin liquids via straw. Unable to assess solids of increased texture d/t pt refusal. Per chart review no reports of difficulty tolerating diet. WIll f/u re: toleration of diet and trials of solids in 1-3 days   HPI Other Pertinent Information: 64 y.o. male with a known history of end-stage renal disease on dialysis, hypertension, diabetes, hyperlipidemia and heart disease. Evaluated by ST on 6/28 and place on Dys II diet w/thin liquids d/t oral dysphagia. No reports of difficulty tolerating current diet.    Pertinent Vitals Pain Assessment: No/denies pain  SLP Plan  Continue with current plan of care    Recommendations Diet recommendations: Dysphagia 2 (fine chop);Thin liquid Liquids provided via: Straw Medication Administration: Whole meds with puree Supervision: Patient able to self feed Compensations: Slow rate;Small sips/bites Postural Changes and/or Swallow Maneuvers: Seated upright 90 degrees              Oral Care Recommendations: Patient independent with oral care Follow up Recommendations: Skilled Nursing facility Plan: Continue with current plan of care    GO     [place] 12/16/2014, 9:55 AM

## 2014-12-16 NOTE — Care Management (Signed)
Important Message  Patient Details  Name: [name] MRN: [id number] Date of Birth: 1950/10/25   Medicare Important Message Given:  Yes-third notification given    Juliann Pulse A Allmond 12/16/2014, 11:52 AM

## 2014-12-16 NOTE — Clinical Social Work Note (Signed)
RN CM stated to CSW that patient is to discharge to return home today as the family did not want to pursue placement and that family wishes for patient to transport via EMS. CSW has completed EMS paperwork. Shela Leff MSW,LCSWA [phone]

## 2014-12-16 NOTE — Discharge Summary (Signed)
Physician Discharge Summary  Patient ID: [name] MRN: [id number] DOB/AGE: 1950/06/27 64 y.o.  Admit date: 12/12/2014 Discharge date: 12/16/2014  Admission Diagnoses: Acute encephalopathy, end-stage renal disease on hemodialysis, anemia, hypertension, type 2 diabetes, hyperlipidemia, gastroesophageal reflux and depression  Discharge Diagnoses: End-stage renal disease on hemodialysis, anemia, hypertension, type 2 diabetes, hyperlipidemia, gastroesophageal reflux and depression  Discharged Condition: stable  Hospital Course: Patient was brought to ED by family members for altered mental status. He vomited about 3 times and became more confused. They witnessed some jumping in his legs and arms. Patient was admitted for acute encephalopathy. Initial CT head was negative. Blood cultures were obtained. Patient was started empirically on vancomycin and Rocephin. In light of possible seizure activity he was started on IV Keppra. No acute findings on MRI. Patient was closely followed by nephrology and received hemodialysis during his stay. Patient's mental status gradually improved. No seizure activity noted. Swallowing evaluation was done. She tolerated diet well. No leukocytosis or left shift. Patient remained afebrile. Blood cultures are negative at 2 days. Neurology was consulted and EEG was done. Keppra was continued. Anemia remained stable. No active bleeding noted. Antihypertensive medications were held for dialysis. He received close blood sugar monitoring with sliding scale insulin coverage. Discharge home today with home health services. Shall resume usual home medications including antihypertensive, statin, PPI and antidepressant. Advised to follow-up in the office in 1-2 weeks.  Consults: neurology  Treatments: Dialysis  Discharge Exam: Blood pressure 152/104, pulse 68, temperature 98.3 F (36.8 C), temperature source Oral, resp. rate 18, height 5\' 1"  (1.549 m), weight 50.9 kg (112 lb 3.4 oz),  SpO2 100 %.   General: Awake and alert, NAD HENT: no JVD, no icterus Chest: clear to auscultation, no rhonchi or crackles. CVS: RRR, S1S2, no tachycardia. Abdomen: soft, nontender, no mass. Ext: no lower leg edema.  Neuro: moving bilateral upper and lower extremities.  Disposition: Home with home health services.    Medication List    TAKE these medications        acetaminophen 325 MG tablet  Commonly known as:  TYLENOL  Take 650 mg by mouth every 6 (six) hours as needed for mild pain or moderate pain.     allopurinol 100 MG tablet  Commonly known as:  ZYLOPRIM  Take 100 mg by mouth daily.     amLODipine 5 MG tablet  Commonly known as:  NORVASC  Take 1 tablet (5 mg total) by mouth daily.     aspirin 81 MG EC tablet  Take 81 mg by mouth daily.     atorvastatin 40 MG tablet  Commonly known as:  LIPITOR  Take 1 tablet (40 mg total) by mouth daily at 6 PM.     calcium acetate 667 MG capsule  Commonly known as:  PHOSLO  Take 1,334 mg by mouth 3 (three) times daily with meals.     citalopram 20 MG tablet  Commonly known as:  CELEXA  Take 1 tablet (20 mg total) by mouth daily.     clopidogrel 75 MG tablet  Commonly known as:  PLAVIX  Take 1 tablet (75 mg total) by mouth daily with breakfast.     heparin 5000 UNIT/ML injection  Inject 1 mL (5,000 Units total) into the skin every 8 (eight) hours.     levETIRAcetam 500 MG tablet  Commonly known as:  KEPPRA  Take 500 mg by mouth every 12 (twelve) hours.     losartan 100 MG tablet  Commonly known  as:  COZAAR  Take 1 tablet (100 mg total) by mouth daily.     Melatonin 3 MG Tabs  Take 3 mg by mouth at bedtime.     metoprolol 50 MG tablet  Commonly known as:  LOPRESSOR  Take 50 mg by mouth every 12 (twelve) hours.     multivitamin Tabs tablet  Take 1 tablet by mouth at bedtime.     nitroGLYCERIN 0.4 MG SL tablet  Commonly known as:  NITROSTAT  Place 1 tablet (0.4 mg total) under the tongue every 5 (five)  minutes x 3 doses as needed for chest pain.     pantoprazole 40 MG tablet  Commonly known as:  PROTONIX  Take 40 mg by mouth daily.         Signed: Rafael Salway 12/16/2014, 12:40 PM

## 2014-12-16 NOTE — Progress Notes (Signed)
Per Dr. Candiss Norse order for norvasc 5mg  PO once

## 2014-12-16 NOTE — Progress Notes (Signed)
Pt A and O x 4. Pt does not speak much english. Pt tolerating diet well with decreased appetite. VSS, with elevated BPs.  Hemodialysis pt and not making urine. Pt weak and not ambulating much. Physical therapy worked with pt today. Pt had IV and tele box removed. Pt left to be discharged home by EMS.

## 2014-12-16 NOTE — Discharge Instructions (Signed)
Confusion Confusion is the inability to think with your usual speed or clarity. Confusion may come on quickly or slowly over time. How quickly the confusion comes on depends on the cause. Confusion can be due to any number of causes. CAUSES   Concussion, head injury, or head trauma.  Seizures.  Stroke.  Fever.  Brain tumor.  Age related decreased brain function (dementia).  Heightened emotional states like rage or terror.  Mental illness in which the person loses the ability to determine what is real and what is not (hallucinations).  Infections such as a urinary tract infection (UTI).  Toxic effects from alcohol, drugs, or prescription medicines.  Dehydration and an imbalance of salts in the body (electrolytes).  Lack of sleep.  Low blood sugar (diabetes).  Low levels of oxygen from conditions such as chronic lung disorders.  Drug interactions or other medicine side effects.  Nutritional deficiencies, especially niacin, thiamine, vitamin C, or vitamin B.  Sudden drop in body temperature (hypothermia).  Change in routine, such as when traveling or hospitalized. SIGNS AND SYMPTOMS  People often describe their thinking as cloudy or unclear when they are confused. Confusion can also include feeling disoriented. That means you are unaware of where or who you are. You may also not know what the date or time is. If confused, you may also have difficulty paying attention, remembering, and making decisions. Some people also act aggressively when they are confused.  DIAGNOSIS  The medical evaluation of confusion may include:  Blood and urine tests.  X-rays.  Brain and nervous system tests.  Analyzing your brain waves (electroencephalogram or EEG).  Magnetic resonance imaging (MRI) of your head.  Computed tomography (CT) scan of your head.  Mental status tests in which your health care provider may ask many questions. Some of these questions may seem silly or strange,  but they are a very important test to help diagnose and treat confusion. TREATMENT  An admission to the hospital may not be needed, but a person with confusion should not be left alone. Stay with a family member or friend until the confusion clears. Avoid alcohol, pain relievers, or sedative drugs until you have fully recovered. Do not drive until directed by your health care provider. HOME CARE INSTRUCTIONS  What family and friends can do:  To find out if someone is confused, ask the person to state his or her name, age, and the date. If the person is unsure or answers incorrectly, he or she is confused.  Always introduce yourself, no matter how well the person knows you.  Often remind the person of his or her location.  Place a calendar and clock near the confused person.  Help the person with his or her medicines. You may want to use a pill box, an alarm as a reminder, or give the person each dose as prescribed.  Talk about current events and plans for the day.  Try to keep the environment calm, quiet, and peaceful.  Make sure the person keeps follow-up visits with his or her health care provider. PREVENTION  Ways to prevent confusion:  Avoid alcohol.  Eat a balanced diet.  Get enough sleep.  Take medicine only as directed by your health care provider.  Do not become isolated. Spend time with other people and make plans for your days.  Keep careful watch on your blood sugar levels if you are diabetic. SEEK IMMEDIATE MEDICAL CARE IF:   You develop severe headaches, repeated vomiting, seizures, blackouts, or  slurred speech.  There is increasing confusion, weakness, numbness, restlessness, or personality changes.  You develop a loss of balance, have marked dizziness, feel uncoordinated, or fall.  You have delusions, hallucinations, or develop severe anxiety.  Your family members think you need to be rechecked. Document Released: 07/11/2004 Document Revised: 10/18/2013  Document Reviewed: 07/09/2013 [facility] Patient Information 2015 [place], [place]. This information is not intended to replace advice given to you by your health care provider. Make sure you discuss any questions you have with your health care provider.  Confusion Confusion is the inability to think with your usual speed or clarity. Confusion may come on quickly or slowly over time. How quickly the confusion comes on depends on the cause. Confusion can be due to any number of causes. CAUSES   Concussion, head injury, or head trauma.  Seizures.  Stroke.  Fever.  Brain tumor.  Age related decreased brain function (dementia).  Heightened emotional states like rage or terror.  Mental illness in which the person loses the ability to determine what is real and what is not (hallucinations).  Infections such as a urinary tract infection (UTI).  Toxic effects from alcohol, drugs, or prescription medicines.  Dehydration and an imbalance of salts in the body (electrolytes).  Lack of sleep.  Low blood sugar (diabetes).  Low levels of oxygen from conditions such as chronic lung disorders.  Drug interactions or other medicine side effects.  Nutritional deficiencies, especially niacin, thiamine, vitamin C, or vitamin B.  Sudden drop in body temperature (hypothermia).  Change in routine, such as when traveling or hospitalized. SIGNS AND SYMPTOMS  People often describe their thinking as cloudy or unclear when they are confused. Confusion can also include feeling disoriented. That means you are unaware of where or who you are. You may also not know what the date or time is. If confused, you may also have difficulty paying attention, remembering, and making decisions. Some people also act aggressively when they are confused.  DIAGNOSIS  The medical evaluation of confusion may include:  Blood and urine tests.  X-rays.  Brain and nervous system tests.  Analyzing your brain waves  (electroencephalogram or EEG).  Magnetic resonance imaging (MRI) of your head.  Computed tomography (CT) scan of your head.  Mental status tests in which your health care provider may ask many questions. Some of these questions may seem silly or strange, but they are a very important test to help diagnose and treat confusion. TREATMENT  An admission to the hospital may not be needed, but a person with confusion should not be left alone. Stay with a family member or friend until the confusion clears. Avoid alcohol, pain relievers, or sedative drugs until you have fully recovered. Do not drive until directed by your health care provider. HOME CARE INSTRUCTIONS  What family and friends can do:  To find out if someone is confused, ask the person to state his or her name, age, and the date. If the person is unsure or answers incorrectly, he or she is confused.  Always introduce yourself, no matter how well the person knows you.  Often remind the person of his or her location.  Place a calendar and clock near the confused person.  Help the person with his or her medicines. You may want to use a pill box, an alarm as a reminder, or give the person each dose as prescribed.  Talk about current events and plans for the day.  Try to keep the environment calm,  quiet, and peaceful.  Make sure the person keeps follow-up visits with his or her health care provider. PREVENTION  Ways to prevent confusion:  Avoid alcohol.  Eat a balanced diet.  Get enough sleep.  Take medicine only as directed by your health care provider.  Do not become isolated. Spend time with other people and make plans for your days.  Keep careful watch on your blood sugar levels if you are diabetic. SEEK IMMEDIATE MEDICAL CARE IF:   You develop severe headaches, repeated vomiting, seizures, blackouts, or slurred speech.  There is increasing confusion, weakness, numbness, restlessness, or personality changes.  You  develop a loss of balance, have marked dizziness, feel uncoordinated, or fall.  You have delusions, hallucinations, or develop severe anxiety.  Your family members think you need to be rechecked. Document Released: 07/11/2004 Document Revised: 10/18/2013 Document Reviewed: 07/09/2013 [facility] Patient Information 2015 [place], [place]. This information is not intended to replace advice given to you by your health care provider. Make sure you discuss any questions you have with your health care provider.  Follow-up in the office in 1-2 weeks.

## 2014-12-16 NOTE — Care Management (Signed)
Spoke with PT concerning evaluation and recommendation is still SNF. Spouse to the room after and reaffirmed would like to take patient home. Daughter had stated that patient was at baseline. Discussed DME with PT Colletta [place] who recommends wheelchair for home use and other DME. A cane, walker, crutch will not resolve issue with performing activities of daily living. A wheelchair will allow patient to safely perform daily activities. Patient can safely propel the wheelchair in the home or has a caregiver who can provide assistance. Spoke with Dr Neldon Mc concerning DME who authorized me to place orders for DME . DME provided to room by Will Ouida Sills at [place].

## 2014-12-16 NOTE — Care Management (Signed)
Spoke with daughter [name] who stated that patient is at about his baseline. Daughter stated that family is able to take care of the patient at home and that he was in a SNF before and did not think this would be best at this time. Family requested Bedside commode, wheelchair and shower bench.

## 2014-12-16 NOTE — Care Management (Signed)
Spoke with Spouse of the patient [name] via the telephone. Wife stated that she would like to take her husband home. PT had recommended SNF and I explained this to the spouse. I am unclear if she actually understood what I was saying to her. Spouse stated that her daughter [name] would be coming to the hospital this morning and I will speak with her concerning arrangements. Spouse also stated that she would like a wheelchair and a bedside commode for home use. I contacted PT and asked for a re evaluation today. Patient has not been seen since th [place] stated that she would see the patient.

## 2014-12-16 NOTE — Progress Notes (Signed)
Subjective:   More awake today, ate breakfast Daughter at bedside.  States that he is close to baseline  Objective:  Vital signs in last 24 hours:  Temp:  [98.3 F (36.8 C)-99 F (37.2 C)] 98.4 F (36.9 C) (07/01 1525) Pulse Rate:  [63-71] 66 (07/01 1525) Resp:  [17-18] 17 (07/01 1525) BP: (152-185)/(61-104) 161/68 mmHg (07/01 1525) SpO2:  [98 %-100 %] 99 % (07/01 1525)  Weight change:  Filed Weights   12/13/14 0957 12/13/14 1516 12/15/14 0915  Weight: 48.9 kg (107 lb 12.9 oz) 49.2 kg (108 lb 7.5 oz) 50.9 kg (112 lb 3.4 oz)    Intake/Output: I/O last 3 completed shifts: In: 1487 [I.V.:1237; IV Piggyback:250] Out: 1000 [Other:1000]     Physical Exam: General: NAD, laying in bed  HEENT anicteric  Neck supple  Pulm/lungs Normal resp effort, no crackles or wheezing  CVS/Heart Regular, no rub  Abdomen:  Soft, non tender  Extremities: No edema  Neurologic: Awake, answers simple questions  Skin: No acute rashes  Access:        Basic Metabolic Panel:  Recent Labs Lab 12/12/14 1911 12/13/14 1155 12/14/14 1015  NA 141 141 140  K 3.5 3.6 3.9  CL 97* 100* 101  CO2 32 33* 30  GLUCOSE 177* 92 86  BUN 17 22* 10  CREATININE 7.93* 9.31* 4.97*  CALCIUM 11.3* 10.5* 8.5*     CBC:  Recent Labs Lab 12/12/14 1911 12/13/14 1155 12/14/14 1015 12/15/14 0600  WBC 8.8 5.7 6.2 6.0  NEUTROABS 5.8  --  1.9  --   HGB 9.0* 7.1* 7.5* 7.7*  HCT 28.6* 22.2* 23.9* 24.3*  MCV 75.1* 74.1* 74.6* 74.6*  PLT 220 155 162 170      Microbiology: Results for orders placed or performed during the hospital encounter of 12/12/14  Culture, blood (routine x 2)     Status: None (Preliminary result)   Collection Time: 12/12/14  9:05 PM  Result Value Ref Range Status   Specimen Description BLOOD  Final   Special Requests Normal  Final   Culture NO GROWTH 2 DAYS  Final   Report Status PENDING  Incomplete    Coagulation Studies: No results for input(s): LABPROT, INR in the last  72 hours.  Urinalysis: No results for input(s): COLORURINE, LABSPEC, PHURINE, GLUCOSEU, HGBUR, BILIRUBINUR, KETONESUR, PROTEINUR, UROBILINOGEN, NITRITE, LEUKOCYTESUR in the last 72 hours.  Invalid input(s): APPERANCEUR    Imaging: No results found.   Medications:   . dextrose 5 % and 0.45% NaCl 42 mL/hr at 12/16/14 0259   . amLODipine  5 mg Oral Daily  . aspirin  300 mg Rectal Daily  . cefTRIAXone (ROCEPHIN)  IV  1 g Intravenous Q24H  . feeding supplement (NEPRO CARB STEADY)  237 mL Oral BID BM  . heparin  5,000 Units Subcutaneous 3 times per day  . insulin aspart  0-5 Units Subcutaneous QHS  . insulin aspart  0-9 Units Subcutaneous TID WC  . levETIRAcetam  500 mg Intravenous Q12H  . sodium chloride  3 mL Intravenous Q12H   acetaminophen **OR** acetaminophen, labetalol  Assessment/ Plan:  64 y.o. 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 End Stage Renal Disease: kidney transplant 2004 status post transplant nephrectomy 04/2014 - next dialysis treatment tomorrow at  2. SHPTH: monitor phos   Calcium level has improved  3. Anemia of CKD - hgb 7.7, continue epogen with HD.   4. Altered mental status. - Infection and seizure work up is in progress - more awake today, improved close to baseline   LOS: 4 Maymie Brunke 7/1/20163:52 PM

## 2014-12-17 LAB — CULTURE, BLOOD (ROUTINE X 2)
Culture: NO GROWTH
Special Requests: NORMAL

## 2015-02-11 ENCOUNTER — Emergency Department
Admission: EM | Admit: 2015-02-11 | Discharge: 2015-02-11 | Disposition: A | Discharge status: Home / Self Care | Payer: BLUE CROSS/BLUE SHIELD | Attending: Emergency Medicine | Admitting: Emergency Medicine

## 2015-02-11 ENCOUNTER — Emergency Department: Payer: BLUE CROSS/BLUE SHIELD

## 2015-02-11 ENCOUNTER — Other Ambulatory Visit: Payer: Self-pay

## 2015-02-11 ENCOUNTER — Encounter: Payer: Self-pay | Admitting: Emergency Medicine

## 2015-02-11 DIAGNOSIS — Z7902 Long term (current) use of antithrombotics/antiplatelets: Secondary | ICD-10-CM | POA: Diagnosis not present

## 2015-02-11 DIAGNOSIS — R4182 Altered mental status, unspecified: Secondary | ICD-10-CM | POA: Diagnosis present

## 2015-02-11 DIAGNOSIS — Y998 Other external cause status: Secondary | ICD-10-CM | POA: Diagnosis not present

## 2015-02-11 DIAGNOSIS — Z87891 Personal history of nicotine dependence: Secondary | ICD-10-CM | POA: Diagnosis not present

## 2015-02-11 DIAGNOSIS — W010XXA Fall on same level from slipping, tripping and stumbling without subsequent striking against object, initial encounter: Secondary | ICD-10-CM | POA: Insufficient documentation

## 2015-02-11 DIAGNOSIS — I120 Hypertensive chronic kidney disease with stage 5 chronic kidney disease or end stage renal disease: Secondary | ICD-10-CM | POA: Diagnosis not present

## 2015-02-11 DIAGNOSIS — Z992 Dependence on renal dialysis: Secondary | ICD-10-CM | POA: Diagnosis not present

## 2015-02-11 DIAGNOSIS — D649 Anemia, unspecified: Secondary | ICD-10-CM | POA: Diagnosis not present

## 2015-02-11 DIAGNOSIS — Y9389 Activity, other specified: Secondary | ICD-10-CM | POA: Diagnosis not present

## 2015-02-11 DIAGNOSIS — Z79899 Other long term (current) drug therapy: Secondary | ICD-10-CM | POA: Diagnosis not present

## 2015-02-11 DIAGNOSIS — N186 End stage renal disease: Secondary | ICD-10-CM | POA: Insufficient documentation

## 2015-02-11 DIAGNOSIS — E1029 Type 1 diabetes mellitus with other diabetic kidney complication: Secondary | ICD-10-CM | POA: Insufficient documentation

## 2015-02-11 DIAGNOSIS — S0083XA Contusion of other part of head, initial encounter: Secondary | ICD-10-CM | POA: Diagnosis not present

## 2015-02-11 DIAGNOSIS — Y9289 Other specified places as the place of occurrence of the external cause: Secondary | ICD-10-CM | POA: Insufficient documentation

## 2015-02-11 DIAGNOSIS — Z7982 Long term (current) use of aspirin: Secondary | ICD-10-CM | POA: Diagnosis not present

## 2015-02-11 DIAGNOSIS — N189 Chronic kidney disease, unspecified: Secondary | ICD-10-CM

## 2015-02-11 LAB — COMPREHENSIVE METABOLIC PANEL
ALT: 13 U/L — ABNORMAL LOW (ref 17–63)
AST: 25 U/L (ref 15–41)
Albumin: 3.5 g/dL (ref 3.5–5.0)
Alkaline Phosphatase: 126 U/L (ref 38–126)
Anion gap: 10 (ref 5–15)
BUN: 51 mg/dL — ABNORMAL HIGH (ref 6–20)
CO2: 30 mmol/L (ref 22–32)
Calcium: 8.6 mg/dL — ABNORMAL LOW (ref 8.9–10.3)
Chloride: 96 mmol/L — ABNORMAL LOW (ref 101–111)
Creatinine, Ser: 6.75 mg/dL — ABNORMAL HIGH (ref 0.61–1.24)
GFR, EST AFRICAN AMERICAN: 9 mL/min — AB (ref >60)
GFR, EST NON AFRICAN AMERICAN: 8 mL/min — AB (ref >60)
Glucose, Bld: 106 mg/dL — ABNORMAL HIGH (ref 65–99)
POTASSIUM: 4 mmol/L (ref 3.5–5.1)
SODIUM: 136 mmol/L (ref 135–145)
Total Bilirubin: 0.9 mg/dL (ref 0.3–1.2)
Total Protein: 7.3 g/dL (ref 6.5–8.1)

## 2015-02-11 LAB — CBC WITH DIFFERENTIAL/PLATELET
BASOS ABS: 0 10*3/uL (ref 0–0.1)
Basophils Relative: 1 %
Eosinophils Absolute: 0.6 10*3/uL (ref 0–0.7)
Eosinophils Relative: 11 %
HEMATOCRIT: 29.2 % — AB (ref 40.0–52.0)
HEMOGLOBIN: 9.4 g/dL — AB (ref 13.0–18.0)
LYMPHS ABS: 1.8 10*3/uL (ref 1.0–3.6)
Lymphocytes Relative: 33 %
MCH: 24.9 pg — AB (ref 26.0–34.0)
MCHC: 32.2 g/dL (ref 32.0–36.0)
MCV: 77.3 fL — AB (ref 80.0–100.0)
Monocytes Absolute: 0.5 10*3/uL (ref 0.2–1.0)
Monocytes Relative: 9 %
NEUTROS ABS: 2.4 10*3/uL (ref 1.4–6.5)
NEUTROS PCT: 46 %
PLATELETS: 199 10*3/uL (ref 150–440)
RBC: 3.77 MIL/uL — ABNORMAL LOW (ref 4.40–5.90)
RDW: 18.2 % — ABNORMAL HIGH (ref 11.5–14.5)
WBC: 5.3 10*3/uL (ref 3.8–10.6)

## 2015-02-11 LAB — TROPONIN I: TROPONIN I: 0.04 ng/mL — AB (ref <0.031)

## 2015-02-11 NOTE — ED Provider Notes (Signed)
-----------------------------------------   3:46 PM on 02/11/2015 -----------------------------------------  Accepted signout from Dr. Reita Cliche. This 64 year old patient was at dialysis when they had some altered mental status. The family has told us that this patient has had similar in the past and is currently back to baseline. The patient has also had a chronic cough.  Dr. Reita Cliche has asked me to follow-up on the CT scan of the head, chest x-ray, blood tests, and reassess the patient.  Chest x-ray: Cardiomegaly, pulmonary edema with a small amount of fluid in the right fissure. Madaline Guthrie M.D., personally viewed this image.   IMPRESSION by Dr. Autumn Patty: Mild asymmetric interstitial pulmonary edema greater on the right.  CT head: FINDINGS: No acute intracranial abnormality. Specifically, no hemorrhage, hydrocephalus, mass lesion, acute infarction, or significant intracranial injury. No acute calvarial abnormality. Visualized paranasal sinuses and mastoids clear. Orbital soft tissues unremarkable.  IMPRESSION: No acute intracranial abnormality.  Labs Reviewed  COMPREHENSIVE METABOLIC PANEL - Abnormal; Notable for the following:    Chloride 96 (*)    Glucose, Bld 106 (*)    BUN 51 (*)    Creatinine, Ser 6.75 (*)    Calcium 8.6 (*)    ALT 13 (*)    GFR calc non Af Amer 8 (*)    GFR calc Af Amer 9 (*)    All other components within normal limits  TROPONIN I - Abnormal; Notable for the following:    Troponin I 0.04 (*)    All other components within normal limits  CBC WITH DIFFERENTIAL/PLATELET - Abnormal; Notable for the following:    RBC 3.77 (*)    Hemoglobin 9.4 (*)    HCT 29.2 (*)    MCV 77.3 (*)    MCH 24.9 (*)    RDW 18.2 (*)    All other components within normal limits   Reassessment of the patient at this time:  Patient is alert and answers simple questions. He is not fully engaged. He appears somewhat weak. A male family member in the room reports this is not  unusual for him. He is often weak at home. They don't like for him to stand up on his own. They report that when he does he fall sometimes.   The patient does say he has pain in his left forearm. He has equal grip strength. There is no redness or erythema through the forearm. This is the arm that he has his dialysis access in.   I have conveyed the results of the CT scan of the head and the chest x-ray to the family. Lab results are pending.   ----------------------------------------- 4:39 PM on 02/11/2015 -----------------------------------------  Lab 7 reviewed. Sodium and potassium are reasonable. Renal function is as expected. White blood cell count is normal at 5.3. There is a somewhat low hemoglobin at 9.4, but this is significantly improved from his 7.7 in June. Troponin is 0.04, which I would consider to be nondiagnostic.  We will discharge the patient home for ongoing care, as the patient is family reports this is a slight behavior for him and he is stable.  Diagnosis: Altered mental status Weakness Chronic renal failure Anemia, chronic    Ahmed Prima, MD 02/11/15 [phone]

## 2015-02-11 NOTE — ED Notes (Signed)
Patient arrives via [facility] from [facility] for increase in confusion. Patient received all but one hour of dialysis. Patient is confused and disoriented at baseline, however family states that the patient is confused beyond baseline.

## 2015-02-11 NOTE — ED Notes (Signed)
Discussed with the patient and spouse discharge instructions and follow-up care. No questions or concerns at this time. Pt leaving with wife and daughter.

## 2015-02-11 NOTE — Discharge Instructions (Signed)
According to your family, the weakness and mild confusion appears to be normal for you. Continue with your current care, including dialysis. Return to the emergency department if there is further weakness, if there is shortness of breath, or if there are other urgent concerns.  Confusion Confusion is the inability to think with your usual speed or clarity. Confusion may come on quickly or slowly over time. How quickly the confusion comes on depends on the cause. Confusion can be due to any number of causes. CAUSES   Concussion, head injury, or head trauma.  Seizures.  Stroke.  Fever.  Brain tumor.  Age related decreased brain function (dementia).  Heightened emotional states like rage or terror.  Mental illness in which the person loses the ability to determine what is real and what is not (hallucinations).  Infections such as a urinary tract infection (UTI).  Toxic effects from alcohol, drugs, or prescription medicines.  Dehydration and an imbalance of salts in the body (electrolytes).  Lack of sleep.  Low blood sugar (diabetes).  Low levels of oxygen from conditions such as chronic lung disorders.  Drug interactions or other medicine side effects.  Nutritional deficiencies, especially niacin, thiamine, vitamin C, or vitamin B.  Sudden drop in body temperature (hypothermia).  Change in routine, such as when traveling or hospitalized. SIGNS AND SYMPTOMS  People often describe their thinking as cloudy or unclear when they are confused. Confusion can also include feeling disoriented. That means you are unaware of where or who you are. You may also not know what the date or time is. If confused, you may also have difficulty paying attention, remembering, and making decisions. Some people also act aggressively when they are confused.  DIAGNOSIS  The medical evaluation of confusion may include:  Blood and urine tests.  X-rays.  Brain and nervous system  tests.  Analyzing your brain waves (electroencephalogram or EEG).  Magnetic resonance imaging (MRI) of your head.  Computed tomography (CT) scan of your head.  Mental status tests in which your health care provider may ask many questions. Some of these questions may seem silly or strange, but they are a very important test to help diagnose and treat confusion. TREATMENT  An admission to the hospital may not be needed, but a person with confusion should not be left alone. Stay with a family member or friend until the confusion clears. Avoid alcohol, pain relievers, or sedative drugs until you have fully recovered. Do not drive until directed by your health care provider. HOME CARE INSTRUCTIONS  What family and friends can do:  To find out if someone is confused, ask the person to state his or her name, age, and the date. If the person is unsure or answers incorrectly, he or she is confused.  Always introduce yourself, no matter how well the person knows you.  Often remind the person of his or her location.  Place a calendar and clock near the confused person.  Help the person with his or her medicines. You may want to use a pill box, an alarm as a reminder, or give the person each dose as prescribed.  Talk about current events and plans for the day.  Try to keep the environment calm, quiet, and peaceful.  Make sure the person keeps follow-up visits with his or her health care provider. PREVENTION  Ways to prevent confusion:  Avoid alcohol.  Eat a balanced diet.  Get enough sleep.  Take medicine only as directed by your health care  provider.  Do not become isolated. Spend time with other people and make plans for your days.  Keep careful watch on your blood sugar levels if you are diabetic. SEEK IMMEDIATE MEDICAL CARE IF:   You develop severe headaches, repeated vomiting, seizures, blackouts, or slurred speech.  There is increasing confusion, weakness, numbness,  restlessness, or personality changes.  You develop a loss of balance, have marked dizziness, feel uncoordinated, or fall.  You have delusions, hallucinations, or develop severe anxiety.  Your family members think you need to be rechecked. Document Released: 07/11/2004 Document Revised: 10/18/2013 Document Reviewed: 07/09/2013 [facility] Patient Information 2015 [place], [place]. This information is not intended to replace advice given to you by your health care provider. Make sure you discuss any questions you have with your health care provider.

## 2015-02-11 NOTE — ED Provider Notes (Addendum)
[facility] Emergency Department Provider Note   ____________________________________________  Time seen: 2:35 PM I have reviewed the triage vital signs and the triage nursing note.  HISTORY  Chief Complaint Altered Mental Status   Historian Patient is a limited historian himself, history providedby his grand son. Mild language barrier, but he answers and understands most [place].  HPI [name] is a 64 y.o. male who lives at home with his wife and grand son, who has a history of diabetes/end-stage renal disease and was at dialysis today and became a little altered and confused. His son states that he was triaged normal this morning but he did have a fall although he does not think he struck his head. Here in the emergency department the son feels like that patient, his father, is at his normal mental status baseline. The son states that the father does have periods of confusion and hallucinations that come and go. He is not recently been sick with a fever, vomiting, pain, or diarrhea. No chest pain or, shortness breath, or abdominal pain. He does have a cough that is intermittent and comes and goes and is not new. Son does not know if the patient makes urine daily or not. There is a history of a prior head bleed after a fall. The patient is on a baby aspirin.   Past Medical History  Diagnosis Date  . Chronic disease anemia   . Hypertension   . High cholesterol   . Type II diabetes mellitus   . History of blood transfusion     "related to anemia"  . GERD (gastroesophageal reflux disease)   . History of stomach ulcers   . Gout   . ESRD (end stage renal disease) on dialysis     "[facility]; [place]; [name]" (09/29/2014)  . Chronic diastolic congestive heart failure     Patient Active Problem List   Diagnosis Date Noted  . Syncope 11/05/2014  . Contaminant given to patient   . Shortness of breath   . Viridans streptococci infection   . Polymicrobial  bacterial infection   . Bacteremia   . 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 ESRD on dialysis   . 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 Acute coronary syndrome 09/29/2014    Past Surgical History  Procedure Laterality Date  . Kidney transplant Right 2004  . Peritoneal catheter insertion  02/2014  . Peritoneal catheter removal  08/2014  . Arteriovenous graft placement Left ~ 1996  . Thrombectomy / arteriovenous graft revision  2015  . Nephrectomy transplanted organ  2015  . Cardiac catheterization  09/29/2014    "[place]"  . Percutaneous coronary stent intervention (pci-s) N/A 09/30/2014    Procedure: PERCUTANEOUS CORONARY STENT INTERVENTION (PCI-S);  Surgeon: [name], MD;  Location: [facility] CATH LAB;  Service: Cardiovascular;  Laterality: N/A;    Current Outpatient Rx  Name  Route  Sig  Dispense  Refill  . acetaminophen (TYLENOL) 325 MG tablet   Oral   Take 650 mg by mouth every 6 (six) hours as needed for mild pain or moderate pain.         Marland Kitchen allopurinol (ZYLOPRIM) 100 MG tablet   Oral   Take 100 mg by mouth daily.      10   . amLODipine (NORVASC) 5 MG tablet   Oral   Take 1 tablet (5 mg total) by mouth daily.   30 tablet  0   . aspirin 81 MG EC tablet   Oral   Take 81 mg by mouth daily.      10   . atorvastatin (LIPITOR) 40 MG tablet   Oral   Take 1 tablet (40 mg total) by mouth daily at 6 PM.   30 tablet   0   . calcium acetate (PHOSLO) 667 MG capsule   Oral   Take 1,334 mg by mouth 3 (three) times daily with meals.         . citalopram (CELEXA) 20 MG tablet   Oral   Take 1 tablet (20 mg total) by mouth daily.   30 tablet   0   . clopidogrel (PLAVIX) 75 MG tablet   Oral   Take 1 tablet (75 mg total) by mouth daily with breakfast.   30 tablet   11   . heparin 5000 UNIT/ML injection   Subcutaneous   Inject 1 mL (5,000 Units total) into the skin every 8 (eight)  hours. Patient not taking: Reported on 12/12/2014   1 mL   5   . levETIRAcetam (KEPPRA) 500 MG tablet   Oral   Take 500 mg by mouth every 12 (twelve) hours.         Marland Kitchen losartan (COZAAR) 100 MG tablet   Oral   Take 1 tablet (100 mg total) by mouth daily.   30 tablet   0   . Melatonin 3 MG TABS   Oral   Take 3 mg by mouth at bedtime.         . metoprolol (LOPRESSOR) 50 MG tablet   Oral   Take 50 mg by mouth every 12 (twelve) hours.       10   . multivitamin (RENA-VIT) TABS tablet   Oral   Take 1 tablet by mouth at bedtime. Patient taking differently: Take 1 tablet by mouth daily.    30 tablet   3   . nitroGLYCERIN (NITROSTAT) 0.4 MG SL tablet   Sublingual   Place 1 tablet (0.4 mg total) under the tongue every 5 (five) minutes x 3 doses as needed for chest pain.   25 tablet   12   . pantoprazole (PROTONIX) 40 MG tablet   Oral   Take 40 mg by mouth daily.           Allergies Ivp dye  Family History  Problem Relation Age of Onset  . Hypertension Mother   . Stroke Mother   . Hypertension Father   . Diabetes Mellitus II Father   . Asthma Father     Social History Social History  Substance Use Topics  . Smoking status: Former Smoker    Types: Cigarettes  . Smokeless tobacco: Never Used     Comment: "quit smoking cigarettes in the 1980's"  . Alcohol Use: No    Review of Systems  Constitutional: Negative for fever. Eyes: Negative for visual changes. ENT: Negative for sore throat. Cardiovascular: Negative for chest pain. Respiratory: Negative for shortness of breath. Gastrointestinal: Negative for abdominal pain, vomiting and diarrhea. Genitourinary:  Musculoskeletal: Negative for back pain. Skin: Negative for rash. Neurological: Negative for headache. 10 point Review of Systems otherwise negative ____________________________________________   PHYSICAL EXAM:  VITAL SIGNS: ED Triage Vitals  Enc Vitals Group     BP --      Pulse --       Resp --      Temp --      Temp  src --      SpO2 --      Weight --      Height --      Head Cir --      Peak Flow --      Pain Score --      Pain Loc --      Pain Edu? --      Excl. in [place]? --      Constitutional: Alert and cooperative. Mild language barrier. Well appearing and in no distress. Eyes: Conjunctivae are normal. PERRL. Normal extraocular movements. ENT   Head: Normocephalic. Ecchymosis on his right chin, old   Nose: No congestion/rhinnorhea.   Mouth/Throat: Mucous membranes are mildly dry..   Neck: No stridor. Cardiovascular/Chest: Normal rate, regular rhythm.  No murmurs, rubs, or gallops. Respiratory: Normal respiratory effort without tachypnea nor retractions. Breath sounds are clear and equal bilaterally. No wheezes/rales/rhonchi. Gastrointestinal: Soft. No distention, no guarding, no rebound. Nontender , obese.  Genitourinary/rectal:Deferred Musculoskeletal: Nontender with normal range of motion in all extremities. No joint effusions.  No lower extremity tenderness.  No edema. Left forearm with obvious fistula, thrill and bruit present. Neurologic:  Normal speech and language. No gross or focal neurologic deficits are appreciated. Normal strength and 4 extremity. No sensory deficit noted. Skin:  Skin is warm, dry and intact. No rash noted.   ____________________________________________   EKG I, Lisa Roca, MD, the attending physician have personally viewed and interpreted all ECGs.  Syndrome beats) normal sinus rhythm. Narrow QRS. Normal axis. Normal beats per minute. Normal sinus rhythm. Narrow QRS. Normal axis. Nonspecific T wave. QTc 469 ____________________________________________  LABS (pertinent positives/negatives)  All pending  ____________________________________________  RADIOLOGY All Xrays were viewed by me. Imaging interpreted by Radiologist.  CT head and chest x-ray are  pending __________________________________________  PROCEDURES  Procedure(s) performed: None  Critical Care performed: None  ____________________________________________   ED COURSE / ASSESSMENT AND PLAN  CONSULTATIONS: None  Pertinent labs & imaging results that were available during my care of the patient were reviewed by me and considered in my medical decision making (see chart for details).   Patient arrived from dialysis after an episode of being confused. He is currently back to his baseline according to his grandson who is at the bedside, and his neurologic exam seems intact. Given his history of prior head bleed, I am going to get a head CT. Given the cough which is intermittent and I am going to check a chest x-ray. He does not make any urine reliably, and his bedside urine bladder scan was negative. Screening labs are to be sent. If laboratory and imaging evaluation are reassuring, and he remains clinically stable, he'll likely be discharged home.  Patient care transferred to Dr. Thomasene Lot at shift change 3 PM.  Patient / Family / Caregiver informed of clinical course, medical decision-making process, and agree with plan.   ___________________________________________   FINAL CLINICAL IMPRESSION(S) / ED DIAGNOSES   Final diagnoses:  Altered mental status, unspecified altered mental status type       Lisa Roca, MD 02/11/15 1448  Lisa Roca, MD 02/11/15 [phone]

## 2015-02-14 ENCOUNTER — Inpatient Hospital Stay
Admit: 2015-02-14 | Discharge: 2015-02-14 | Disposition: A | Discharge status: Home / Self Care | Payer: BLUE CROSS/BLUE SHIELD | Attending: Internal Medicine | Admitting: Internal Medicine

## 2015-02-14 ENCOUNTER — Inpatient Hospital Stay
Admission: EM | Admit: 2015-02-14 | Discharge: 2015-02-17 | DRG: 682 | Disposition: A | Discharge status: Home Health | Payer: BLUE CROSS/BLUE SHIELD | Attending: Internal Medicine | Admitting: Internal Medicine

## 2015-02-14 ENCOUNTER — Emergency Department: Payer: BLUE CROSS/BLUE SHIELD

## 2015-02-14 ENCOUNTER — Encounter: Payer: Self-pay | Admitting: Emergency Medicine

## 2015-02-14 DIAGNOSIS — Z7902 Long term (current) use of antithrombotics/antiplatelets: Secondary | ICD-10-CM | POA: Diagnosis not present

## 2015-02-14 DIAGNOSIS — I120 Hypertensive chronic kidney disease with stage 5 chronic kidney disease or end stage renal disease: Principal | ICD-10-CM | POA: Diagnosis present

## 2015-02-14 DIAGNOSIS — N186 End stage renal disease: Secondary | ICD-10-CM | POA: Diagnosis present

## 2015-02-14 DIAGNOSIS — M109 Gout, unspecified: Secondary | ICD-10-CM | POA: Diagnosis present

## 2015-02-14 DIAGNOSIS — Z955 Presence of coronary angioplasty implant and graft: Secondary | ICD-10-CM | POA: Diagnosis not present

## 2015-02-14 DIAGNOSIS — I5032 Chronic diastolic (congestive) heart failure: Secondary | ICD-10-CM | POA: Diagnosis present

## 2015-02-14 DIAGNOSIS — Z66 Do not resuscitate: Secondary | ICD-10-CM | POA: Diagnosis present

## 2015-02-14 DIAGNOSIS — E780 Pure hypercholesterolemia: Secondary | ICD-10-CM | POA: Diagnosis present

## 2015-02-14 DIAGNOSIS — Z992 Dependence on renal dialysis: Secondary | ICD-10-CM | POA: Diagnosis not present

## 2015-02-14 DIAGNOSIS — J810 Acute pulmonary edema: Secondary | ICD-10-CM

## 2015-02-14 DIAGNOSIS — G40909 Epilepsy, unspecified, not intractable, without status epilepticus: Secondary | ICD-10-CM | POA: Diagnosis present

## 2015-02-14 DIAGNOSIS — Z8249 Family history of ischemic heart disease and other diseases of the circulatory system: Secondary | ICD-10-CM | POA: Diagnosis not present

## 2015-02-14 DIAGNOSIS — Z91041 Radiographic dye allergy status: Secondary | ICD-10-CM | POA: Diagnosis not present

## 2015-02-14 DIAGNOSIS — Z905 Acquired absence of kidney: Secondary | ICD-10-CM | POA: Diagnosis present

## 2015-02-14 DIAGNOSIS — Z87891 Personal history of nicotine dependence: Secondary | ICD-10-CM

## 2015-02-14 DIAGNOSIS — Z79899 Other long term (current) drug therapy: Secondary | ICD-10-CM

## 2015-02-14 DIAGNOSIS — F329 Major depressive disorder, single episode, unspecified: Secondary | ICD-10-CM | POA: Diagnosis present

## 2015-02-14 DIAGNOSIS — Z7982 Long term (current) use of aspirin: Secondary | ICD-10-CM

## 2015-02-14 DIAGNOSIS — J9601 Acute respiratory failure with hypoxia: Secondary | ICD-10-CM | POA: Diagnosis present

## 2015-02-14 DIAGNOSIS — E1122 Type 2 diabetes mellitus with diabetic chronic kidney disease: Secondary | ICD-10-CM | POA: Diagnosis present

## 2015-02-14 DIAGNOSIS — Z833 Family history of diabetes mellitus: Secondary | ICD-10-CM

## 2015-02-14 DIAGNOSIS — I2510 Atherosclerotic heart disease of native coronary artery without angina pectoris: Secondary | ICD-10-CM | POA: Diagnosis present

## 2015-02-14 DIAGNOSIS — K219 Gastro-esophageal reflux disease without esophagitis: Secondary | ICD-10-CM | POA: Diagnosis present

## 2015-02-14 DIAGNOSIS — D631 Anemia in chronic kidney disease: Secondary | ICD-10-CM | POA: Diagnosis present

## 2015-02-14 LAB — COMPREHENSIVE METABOLIC PANEL
ALT: 12 U/L — AB (ref 17–63)
AST: 22 U/L (ref 15–41)
Albumin: 3.2 g/dL — ABNORMAL LOW (ref 3.5–5.0)
Alkaline Phosphatase: 106 U/L (ref 38–126)
Anion gap: 11 (ref 5–15)
BUN: 88 mg/dL — ABNORMAL HIGH (ref 6–20)
CHLORIDE: 99 mmol/L — AB (ref 101–111)
CO2: 26 mmol/L (ref 22–32)
CREATININE: 11.88 mg/dL — AB (ref 0.61–1.24)
Calcium: 8.6 mg/dL — ABNORMAL LOW (ref 8.9–10.3)
GFR, EST AFRICAN AMERICAN: 5 mL/min — AB (ref >60)
GFR, EST NON AFRICAN AMERICAN: 4 mL/min — AB (ref >60)
Glucose, Bld: 92 mg/dL (ref 65–99)
POTASSIUM: 5.6 mmol/L — AB (ref 3.5–5.1)
Sodium: 136 mmol/L (ref 135–145)
TOTAL PROTEIN: 6.5 g/dL (ref 6.5–8.1)
Total Bilirubin: 0.6 mg/dL (ref 0.3–1.2)

## 2015-02-14 LAB — CBC WITH DIFFERENTIAL/PLATELET
BASOS ABS: 0.1 10*3/uL (ref 0–0.1)
BASOS PCT: 1 %
EOS ABS: 1.2 10*3/uL — AB (ref 0–0.7)
Eosinophils Relative: 15 %
HCT: 25.3 % — ABNORMAL LOW (ref 40.0–52.0)
HEMOGLOBIN: 8.1 g/dL — AB (ref 13.0–18.0)
LYMPHS ABS: 2.3 10*3/uL (ref 1.0–3.6)
Lymphocytes Relative: 30 %
MCH: 24.6 pg — AB (ref 26.0–34.0)
MCHC: 31.9 g/dL — ABNORMAL LOW (ref 32.0–36.0)
MCV: 77 fL — ABNORMAL LOW (ref 80.0–100.0)
Monocytes Absolute: 0.7 10*3/uL (ref 0.2–1.0)
Monocytes Relative: 8 %
NEUTROS PCT: 46 %
Neutro Abs: 3.6 10*3/uL (ref 1.4–6.5)
Platelets: 172 10*3/uL (ref 150–440)
RBC: 3.29 MIL/uL — AB (ref 4.40–5.90)
RDW: 18.4 % — ABNORMAL HIGH (ref 11.5–14.5)
WBC: 7.8 10*3/uL (ref 3.8–10.6)

## 2015-02-14 LAB — TROPONIN I: TROPONIN I: 0.03 ng/mL (ref <0.031)

## 2015-02-14 LAB — PHOSPHORUS: PHOSPHORUS: 2.9 mg/dL (ref 2.5–4.6)

## 2015-02-14 MED ORDER — PANTOPRAZOLE SODIUM 40 MG PO TBEC
40.0000 mg | DELAYED_RELEASE_TABLET | Freq: Every day | ORAL | Status: DC
  Administered 2015-02-14 – 2015-02-17 (×4): 40 mg via ORAL
  Filled 2015-02-14 (×4): qty 1

## 2015-02-14 MED ORDER — CITALOPRAM HYDROBROMIDE 20 MG PO TABS
20.0000 mg | ORAL_TABLET | Freq: Every day | ORAL | Status: DC
  Administered 2015-02-14 – 2015-02-17 (×4): 20 mg via ORAL
  Filled 2015-02-14 (×4): qty 1

## 2015-02-14 MED ORDER — POLYETHYLENE GLYCOL 3350 17 G PO PACK: 17.0000 g | PACK | Freq: Every day | ORAL | Status: DC | PRN

## 2015-02-14 MED ORDER — EPOETIN ALFA 10000 UNIT/ML IJ SOLN
10000.0000 [IU] | INTRAMUSCULAR | Status: DC
  Administered 2015-02-14 – 2015-02-16 (×2): 10000 [IU] via INTRAVENOUS

## 2015-02-14 MED ORDER — LEVOFLOXACIN IN D5W 500 MG/100ML IV SOLN
INTRAVENOUS | Status: AC
  Filled 2015-02-14: qty 100

## 2015-02-14 MED ORDER — LEVETIRACETAM 500 MG PO TABS
500.0000 mg | ORAL_TABLET | Freq: Two times a day (BID) | ORAL | Status: DC
  Administered 2015-02-14 – 2015-02-17 (×5): 500 mg via ORAL
  Filled 2015-02-14 (×5): qty 1

## 2015-02-14 MED ORDER — ACETAMINOPHEN 325 MG PO TABS
650.0000 mg | ORAL_TABLET | Freq: Four times a day (QID) | ORAL | Status: DC | PRN
  Administered 2015-02-16: 650 mg via ORAL
  Filled 2015-02-14: qty 2

## 2015-02-14 MED ORDER — IPRATROPIUM-ALBUTEROL 0.5-2.5 (3) MG/3ML IN SOLN
RESPIRATORY_TRACT | Status: AC
  Administered 2015-02-14: 1 mL
  Filled 2015-02-14: qty 3

## 2015-02-14 MED ORDER — ALTEPLASE 2 MG IJ SOLR
2.0000 mg | Freq: Once | INTRAMUSCULAR | Status: DC | PRN
  Filled 2015-02-14: qty 2

## 2015-02-14 MED ORDER — MIRTAZAPINE 15 MG PO TABS
7.5000 mg | ORAL_TABLET | Freq: Every day | ORAL | Status: DC
  Administered 2015-02-14 – 2015-02-16 (×3): 7.5 mg via ORAL
  Filled 2015-02-14 (×4): qty 1

## 2015-02-14 MED ORDER — HEPARIN SODIUM (PORCINE) 1000 UNIT/ML DIALYSIS
1000.0000 [IU] | INTRAMUSCULAR | Status: DC | PRN
  Filled 2015-02-14: qty 1

## 2015-02-14 MED ORDER — NITROGLYCERIN 0.4 MG SL SUBL: 0.4000 mg | SUBLINGUAL_TABLET | SUBLINGUAL | Status: DC | PRN

## 2015-02-14 MED ORDER — FUROSEMIDE 10 MG/ML IJ SOLN
60.0000 mg | Freq: Once | INTRAMUSCULAR | Status: AC
  Administered 2015-02-14: 60 mg via INTRAVENOUS
  Filled 2015-02-14: qty 8

## 2015-02-14 MED ORDER — METOPROLOL TARTRATE 50 MG PO TABS: 50.0000 mg | ORAL_TABLET | Freq: Two times a day (BID) | ORAL | Status: DC

## 2015-02-14 MED ORDER — HEPARIN SODIUM (PORCINE) 5000 UNIT/ML IJ SOLN
5000.0000 [IU] | Freq: Two times a day (BID) | INTRAMUSCULAR | Status: DC
  Administered 2015-02-14 – 2015-02-16 (×4): 5000 [IU] via SUBCUTANEOUS
  Filled 2015-02-14 (×4): qty 1

## 2015-02-14 MED ORDER — PENTAFLUOROPROP-TETRAFLUOROETH EX AERO: 1.0000 "application " | INHALATION_SPRAY | CUTANEOUS | Status: DC | PRN

## 2015-02-14 MED ORDER — CLOPIDOGREL BISULFATE 75 MG PO TABS
75.0000 mg | ORAL_TABLET | Freq: Every day | ORAL | Status: DC
  Administered 2015-02-15 – 2015-02-17 (×3): 75 mg via ORAL
  Filled 2015-02-14 (×3): qty 1

## 2015-02-14 MED ORDER — SODIUM CHLORIDE 0.9 % IV SOLN: 100.0000 mL | INTRAVENOUS | Status: DC | PRN

## 2015-02-14 MED ORDER — ACETAMINOPHEN 650 MG RE SUPP: 650.0000 mg | Freq: Four times a day (QID) | RECTAL | Status: DC | PRN

## 2015-02-14 MED ORDER — ATORVASTATIN CALCIUM 20 MG PO TABS
40.0000 mg | ORAL_TABLET | Freq: Every day | ORAL | Status: DC
  Administered 2015-02-14 – 2015-02-16 (×3): 40 mg via ORAL
  Filled 2015-02-14 (×3): qty 2

## 2015-02-14 MED ORDER — AMLODIPINE BESYLATE 5 MG PO TABS
5.0000 mg | ORAL_TABLET | Freq: Every day | ORAL | Status: DC
  Administered 2015-02-14 – 2015-02-17 (×4): 5 mg via ORAL
  Filled 2015-02-14 (×4): qty 1

## 2015-02-14 MED ORDER — ONDANSETRON HCL 4 MG/2ML IJ SOLN: 4.0000 mg | Freq: Four times a day (QID) | INTRAMUSCULAR | Status: DC | PRN

## 2015-02-14 MED ORDER — HEPARIN SODIUM (PORCINE) 5000 UNIT/ML IJ SOLN: 5000.0000 [IU] | Freq: Three times a day (TID) | INTRAMUSCULAR | Status: DC

## 2015-02-14 MED ORDER — LIDOCAINE HCL (PF) 1 % IJ SOLN
5.0000 mL | INTRAMUSCULAR | Status: DC | PRN
  Filled 2015-02-14: qty 5

## 2015-02-14 MED ORDER — LIDOCAINE-PRILOCAINE 2.5-2.5 % EX CREA: 1.0000 "application " | TOPICAL_CREAM | CUTANEOUS | Status: DC | PRN

## 2015-02-14 MED ORDER — ONDANSETRON HCL 4 MG PO TABS: 4.0000 mg | ORAL_TABLET | Freq: Four times a day (QID) | ORAL | Status: DC | PRN

## 2015-02-14 MED ORDER — NEPRO/CARBSTEADY PO LIQD: 237.0000 mL | ORAL | Status: DC | PRN

## 2015-02-14 MED ORDER — MELATONIN 3 MG PO TABS: 3.0000 mg | ORAL_TABLET | Freq: Every day | ORAL | Status: DC

## 2015-02-14 MED ORDER — ASPIRIN 81 MG PO TBEC: 81.0000 mg | DELAYED_RELEASE_TABLET | Freq: Every day | ORAL | Status: DC

## 2015-02-14 MED ORDER — LOSARTAN POTASSIUM 50 MG PO TABS
100.0000 mg | ORAL_TABLET | Freq: Every day | ORAL | Status: DC
  Administered 2015-02-14 – 2015-02-17 (×4): 100 mg via ORAL
  Filled 2015-02-14 (×4): qty 2

## 2015-02-14 MED ORDER — ENOXAPARIN SODIUM 30 MG/0.3ML ~~LOC~~ SOLN: 30.0000 mg | SUBCUTANEOUS | Status: DC

## 2015-02-14 MED ORDER — ALLOPURINOL 100 MG PO TABS
100.0000 mg | ORAL_TABLET | Freq: Every day | ORAL | Status: DC
  Administered 2015-02-14 – 2015-02-17 (×4): 100 mg via ORAL
  Filled 2015-02-14 (×4): qty 1

## 2015-02-14 MED ORDER — RENA-VITE PO TABS
1.0000 | ORAL_TABLET | Freq: Every day | ORAL | Status: DC
Start: 2015-02-14 — End: 2015-02-17
  Administered 2015-02-14 – 2015-02-16 (×3): 1 via ORAL
  Filled 2015-02-14 (×3): qty 1

## 2015-02-14 MED ORDER — ASPIRIN EC 81 MG PO TBEC
81.0000 mg | DELAYED_RELEASE_TABLET | Freq: Every day | ORAL | Status: DC
  Administered 2015-02-14 – 2015-02-17 (×4): 81 mg via ORAL
  Filled 2015-02-14 (×4): qty 1

## 2015-02-14 NOTE — Progress Notes (Signed)
Pre-hd tx 

## 2015-02-14 NOTE — ED Provider Notes (Signed)
[facility] Emergency Department Provider Note  ____________________________________________  Time seen: 6:30 AM  I have reviewed the triage vital signs and the nursing notes.   History obtained via language line [place] translator  HISTORY  Chief Complaint Respiratory Distress      HPI [name] is a 64 y.o. male presents via EMS with progressive dyspnea and productive cough times one day. She denies any chest pain no fever. Per EMS on the presentation oxygen level LXXXs nonrebreather was applied with improvement of oxygenation to 96%. Of note patient has a history of renal failure and has dialysis Tuesday surgery Sunday which she is mass none. Patient was recently seen in the emergency department on 02/11/2015 review of chest x-ray that time revealed interstitial pulmonary edema right greater than left.     Past Medical History  Diagnosis Date  . Chronic disease anemia   . Hypertension   . High cholesterol   . Type II diabetes mellitus   . History of blood transfusion     "related to anemia"  . GERD (gastroesophageal reflux disease)   . History of stomach ulcers   . Gout   . ESRD (end stage renal disease) on dialysis     "[facility]; [place]; [name]" (09/29/2014)  . Chronic diastolic congestive heart failure     Patient Active Problem List   Diagnosis Date Noted  . Syncope 11/05/2014  . Contaminant given to patient   . Shortness of breath   . Viridans streptococci infection   . Polymicrobial bacterial infection   . Bacteremia   . 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 ESRD on dialysis   . 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 Acute coronary syndrome 09/29/2014    Past Surgical History  Procedure Laterality Date  . Kidney transplant Right 2004  . Peritoneal catheter insertion  02/2014  . Peritoneal catheter removal  08/2014  . Arteriovenous graft placement Left ~ 1996   . Thrombectomy / arteriovenous graft revision  2015  . Nephrectomy transplanted organ  2015  . Cardiac catheterization  09/29/2014    "[place]"  . Percutaneous coronary stent intervention (pci-s) N/A 09/30/2014    Procedure: PERCUTANEOUS CORONARY STENT INTERVENTION (PCI-S);  Surgeon: Mohan Harwani, MD;  Location: [facility] CATH LAB;  Service: Cardiovascular;  Laterality: N/A;    Current Outpatient Rx  Name  Route  Sig  Dispense  Refill  . acetaminophen (TYLENOL) 325 MG tablet   Oral   Take 650 mg by mouth every 6 (six) hours as needed for mild pain or moderate pain.         . allopurinol (ZYLOPRIM) 100 MG tablet   Oral   Take 100 mg by mouth daily.      10   . amLODipine (NORVASC) 5 MG tablet   Oral   Take 1 tablet (5 mg total) by mouth daily.   30 tablet   0   . aspirin 81 MG EC tablet   Oral   Take 81 mg by mouth daily.      10    . atorvastatin (LIPITOR) 40 MG tablet   Oral   Take 1 tablet (40 mg total) by mouth daily at 6 PM.   30 tablet   0   . calcium acetate (PHOSLO) 667 MG capsule   Oral   Take 1,334 mg by mouth 3 (three) times daily with meals.         Marland Kitchen  citalopram (CELEXA) 20 MG tablet   Oral   Take 1 tablet (20 mg total) by mouth daily.   30 tablet   0   . clopidogrel (PLAVIX) 75 MG tablet   Oral   Take 1 tablet (75 mg total) by mouth daily with breakfast.   30 tablet   11   . heparin 5000 UNIT/ML injection   Subcutaneous   Inject 1 mL (5,000 Units total) into the skin every 8 (eight) hours. Patient not taking: Reported on 12/12/2014   1 mL   5   . levETIRAcetam (KEPPRA) 500 MG tablet   Oral   Take 500 mg by mouth every 12 (twelve) hours.         Marland Kitchen losartan (COZAAR) 100 MG tablet   Oral   Take 1 tablet (100 mg total) by mouth daily.   30 tablet   0   . Melatonin 3 MG TABS   Oral   Take 3 mg by mouth at bedtime.         . metoprolol (LOPRESSOR) 50 MG tablet   Oral   Take 50 mg by mouth every 12 (twelve) hours.       10   .  multivitamin (RENA-VIT) TABS tablet   Oral   Take 1 tablet by mouth at bedtime. Patient taking differently: Take 1 tablet by mouth daily.    30 tablet   3   . nitroGLYCERIN (NITROSTAT) 0.4 MG SL tablet   Sublingual   Place 1 tablet (0.4 mg total) under the tongue every 5 (five) minutes x 3 doses as needed for chest pain.   25 tablet   12   . pantoprazole (PROTONIX) 40 MG tablet   Oral   Take 40 mg by mouth daily.           Allergies Ivp dye  Family History  Problem Relation Age of Onset  . Hypertension Mother   . Stroke Mother   . Hypertension Father   . Diabetes Mellitus II Father   . Asthma Father     Social History Social History  Substance Use Topics  . Smoking status: Former Smoker    Types: Cigarettes  . Smokeless tobacco: Never Used     Comment: "quit smoking cigarettes in the 1980's"  . Alcohol Use: No    Review of Systems  Constitutional: Negative for fever. Eyes: Negative for visual changes. ENT: Negative for sore throat. Cardiovascular: Negative for chest pain. Respiratory: Negative for shortness of breath. Gastrointestinal: Negative for abdominal pain, vomiting and diarrhea. Genitourinary: Negative for dysuria. Musculoskeletal: Negative for back pain. Skin: Negative for rash. Neurological: Negative for headaches, focal weakness or numbness.   10-point ROS otherwise negative.  ____________________________________________   PHYSICAL EXAM:  VITAL SIGNS: ED Triage Vitals  Enc Vitals Group     BP 02/14/15 0620 179/79 mmHg     Pulse Rate 02/14/15 0620 68     Resp 02/14/15 0620 20     Temp 02/14/15 0620 98 F (36.7 C)     Temp Source 02/14/15 0620 Oral     SpO2 02/14/15 0618 96 %     Weight 02/14/15 0620 130 lb (58.968 kg)     Height 02/14/15 0620 5\' 3"  (1.6 m)     Head Cir --      Peak Flow --      Pain Score 02/14/15 0622 0     Pain Loc --      Pain Edu? --  Excl. in [place]? --      Constitutional: Alert and oriented.  Apparent respiratory distress Eyes: Conjunctivae are normal. PERRL. Normal extraocular movements. ENT   Head: Normocephalic and atraumatic.   Nose: No congestion/rhinnorhea.   Mouth/Throat: Mucous membranes are moist.   Neck: No stridor. Cardiovascular: Normal rate, regular rhythm. Normal and symmetric distal pulses are present in all extremities. No murmurs, rubs, or gallops. Respiratory: Tachypnea and accessory history muscle use bibasilar rhonchi/rales  Gastrointestinal: Soft and nontender. No distention. There is no CVA tenderness. Genitourinary: deferred Musculoskeletal: Nontender with normal range of motion in all extremities. No joint effusions.  No lower extremity tenderness nor edema. Neurologic:  Normal speech and language. No gross focal neurologic deficits are appreciated. Speech is normal.  Skin:  Skin is warm, dry and intact. No rash noted. Psychiatric: Mood and affect are normal. Speech and behavior are normal. Patient exhibits appropriate insight and judgment.  ____________________________________________    LABS (pertinent positives/negatives)  Labs Reviewed  COMPREHENSIVE METABOLIC PANEL - Abnormal; Notable for the following:    Potassium 5.6 (*)    Chloride 99 (*)    BUN 88 (*)    Creatinine, Ser 11.88 (*)    Calcium 8.6 (*)    Albumin 3.2 (*)    ALT 12 (*)    GFR calc non Af Amer 4 (*)    GFR calc Af Amer 5 (*)    All other components within normal limits  CBC WITH DIFFERENTIAL/PLATELET - Abnormal; Notable for the following:    RBC 3.29 (*)    Hemoglobin 8.1 (*)    HCT 25.3 (*)    MCV 77.0 (*)    MCH 24.6 (*)    MCHC 31.9 (*)    RDW 18.4 (*)    Eosinophils Absolute 1.2 (*)    All other components within normal limits  BASIC METABOLIC PANEL - Abnormal; Notable for the following:    Chloride 100 (*)    Glucose, Bld 114 (*)    BUN 40 (*)    Creatinine, Ser 6.86 (*)    Calcium 8.7 (*)    GFR calc non Af Amer 8 (*)    GFR calc Af Amer 9 (*)     All other components within normal limits  TROPONIN I  HEPATITIS B SURFACE ANTIGEN  PHOSPHORUS  CBC WITH DIFFERENTIAL/PLATELET  COMPREHENSIVE METABOLIC PANEL  PHOSPHORUS     ____________________________________________   EKG  ED ECG REPORT I, Leanny Moeckel, Weeki [place] N, the attending physician, personally viewed and interpreted this ECG.   Date: 02/14/2015  EKG Time: 6:23AM  Rate: 67  Rhythm: Normal sinus rhythm  Axis: None  Intervals: Normal  ST&T Change: None   ____________________________________________    RADIOLOGY    DG Chest Port 1 View (Final result) Result time: 02/14/15 07:14:00   Final result by Rad Results In Interface (02/14/15 07:14:00)   Narrative:   CLINICAL DATA: Shortness of breath  EXAM: PORTABLE CHEST - 1 VIEW  COMPARISON: Three days ago  FINDINGS: Chronic cardiomegaly and aortic tortuosity. Cardiomediastinal size is accentuated by rightward rotation.  Bilateral perihilar and basilar interstitial and airspace opacities with pleural effusions. No air leak.  IMPRESSION: Pulmonary edema pattern with pleural effusions, increased from 3 days ago.   Electronically Signed By: Monte Fantasia M.D. On: 02/14/2015 07:14    Critical care: CRITICAL CARE Performed by: Marjean Donna N   Total critical care time: 70minutes  Critical care time was exclusive of separately billable procedures and treating other patients.  Critical care was necessary to treat or prevent imminent or life-threatening deterioration.  Critical care was time spent personally by me on the following activities: development of treatment plan with patient and/or surrogate as well as nursing, discussions with consultants, evaluation of patient's response to treatment, examination of patient, obtaining history from patient or surrogate, ordering and performing treatments and interventions, ordering and review of laboratory studies, ordering and review of radiographic  studies, pulse oximetry and re-evaluation of patient's condition.   INITIAL IMPRESSION / ASSESSMENT AND PLAN / ED COURSE  Pertinent labs & imaging results that were available during my care of the patient were reviewed by me and considered in my medical decision making (see chart for details).  Patient received Lasix 60 mg DuoNeb's time to. Following DuoNeb's patient satting 96% on 3 L nasal cannula. Patient arrived to the emergency department on a nonrebreather. She discussed with Dr.konidena for hospital admission for emergent hemodialysis. ____________________________________________   FINAL CLINICAL IMPRESSION(S) / ED DIAGNOSES  Final diagnoses:  Acute respiratory failure with hypoxia  Acute pulmonary edema      Gregor Hams, MD 02/17/15 [phone]

## 2015-02-14 NOTE — Progress Notes (Signed)
HD tx start 

## 2015-02-14 NOTE — Progress Notes (Signed)
HD tx completed.

## 2015-02-14 NOTE — Progress Notes (Signed)
Spoke with dr. Clayborn Bigness to make aware of patients 2.19 second pause. Per md discontinue metoprolol dose and order echo.

## 2015-02-14 NOTE — Progress Notes (Signed)
*  PRELIMINARY RESULTS* Echocardiogram 2D Echocardiogram has been performed.  [name] 02/14/2015, 7:12 PM

## 2015-02-14 NOTE — Progress Notes (Signed)
Post hd tx 

## 2015-02-14 NOTE — Progress Notes (Signed)
[facility]  ROUNDING NOTE   Subjective:  Pt well known to [place].  Presented with shortness of breath.  Found to have pulmonary edema and increasing pulmonary edema on CXR. Seen during HD today. UF target 2.5kg.    Objective:  Vital signs in last 24 hours:  Temp:  [97.5 F (36.4 C)-98.7 F (37.1 C)] 97.5 F (36.4 C) (08/30 1020) Pulse Rate:  [61-68] 62 (08/30 1200) Resp:  [17-24] 20 (08/30 1200) BP: (139-193)/(71-92) 139/73 mmHg (08/30 1200) SpO2:  [92 %-100 %] 100 % (08/30 1200) Weight:  [58.968 kg (130 lb)-66 kg (145 lb 8.1 oz)] 66 kg (145 lb 8.1 oz) (08/30 1020)  Weight change:  Filed Weights   02/14/15 0620 02/14/15 0641 02/14/15 1020  Weight: 58.968 kg (130 lb) 66 kg (145 lb 8.1 oz) 66 kg (145 lb 8.1 oz)    Intake/Output:     Intake/Output this shift:     Physical Exam: General: NAD, resting in bed  Head: Normocephalic, atraumatic. Moist oral mucosal membranes  Eyes: Anicteric, PERRL  Neck: Supple, trachea midline  Lungs:  Diminished BS at bases, normal effort  Heart: Regular rate and rhythm  Abdomen:  Soft, nontender, BS present  Extremities:  trace peripheral edema.  Neurologic: Nonfocal, moving all four extremities  Skin: No lesions  Access: Left forearm AVF    Basic Metabolic Panel:  Recent Labs Lab 02/11/15 1530 02/14/15 0640  NA 136 136  K 4.0 5.6*  CL 96* 99*  CO2 30 26  GLUCOSE 106* 92  BUN 51* 88*  CREATININE 6.75* 11.88*  CALCIUM 8.6* 8.6*  PHOS  --  2.9    Liver Function Tests:  Recent Labs Lab 02/11/15 1530 02/14/15 0640  AST 25 22  ALT 13* 12*  ALKPHOS 126 106  BILITOT 0.9 0.6  PROT 7.3 6.5  ALBUMIN 3.5 3.2*   No results for input(s): LIPASE, AMYLASE in the last 168 hours. No results for input(s): AMMONIA in the last 168 hours.  CBC:  Recent Labs Lab 02/11/15 1530 02/14/15 0640  WBC 5.3 7.8  NEUTROABS 2.4 3.6  HGB 9.4* 8.1*  HCT 29.2* 25.3*  MCV 77.3* 77.0*  PLT 199 172    Cardiac  Enzymes:  Recent Labs Lab 02/11/15 1530 02/14/15 0640  TROPONINI 0.04* 0.03    BNP: Invalid input(s): POCBNP  CBG: No results for input(s): GLUCAP in the last 168 hours.  Microbiology: Results for orders placed or performed during the hospital encounter of 12/12/14  Culture, blood (routine x 2)     Status: None   Collection Time: 12/12/14  9:05 PM  Result Value Ref Range Status   Specimen Description BLOOD  Final   Special Requests Normal  Final   Culture NO GROWTH 5 DAYS  Final   Report Status 12/17/2014 FINAL  Final    Coagulation Studies: No results for input(s): LABPROT, INR in the last 72 hours.  Urinalysis: No results for input(s): COLORURINE, LABSPEC, PHURINE, GLUCOSEU, HGBUR, BILIRUBINUR, KETONESUR, PROTEINUR, UROBILINOGEN, NITRITE, LEUKOCYTESUR in the last 72 hours.  Invalid input(s): APPERANCEUR    Imaging: Dg Chest Port 1 View  02/14/2015   CLINICAL DATA:  Shortness of breath  EXAM: PORTABLE CHEST - 1 VIEW  COMPARISON:  Three days ago  FINDINGS: Chronic cardiomegaly and aortic tortuosity. Cardiomediastinal size is accentuated by rightward rotation.  Bilateral perihilar and basilar interstitial and airspace opacities with pleural effusions. No air leak.  IMPRESSION: Pulmonary edema pattern with pleural effusions, increased from 3 days ago.  Electronically Signed   By: Monte Fantasia M.D.   On: 02/14/2015 07:14     Medications:     . allopurinol  100 mg Oral Daily  . amLODipine  5 mg Oral Daily  . aspirin EC  81 mg Oral Daily  . atorvastatin  40 mg Oral q1800  . citalopram  20 mg Oral Daily  . clopidogrel  75 mg Oral Q breakfast  . epoetin (EPOGEN/PROCRIT) injection  10,000 Units Intravenous Q T,Th,Sa-HD  . heparin subcutaneous  5,000 Units Subcutaneous Q12H  . levETIRAcetam  500 mg Oral Q12H  . losartan  100 mg Oral Daily  . metoprolol  50 mg Oral Q12H  . mirtazapine  7.5 mg Oral QHS  . multivitamin  1 tablet Oral QHS  . pantoprazole  40 mg Oral  Daily   sodium chloride, sodium chloride, acetaminophen **OR** acetaminophen, alteplase, feeding supplement (NEPRO CARB STEADY), heparin, lidocaine (PF), lidocaine-prilocaine, nitroGLYCERIN, ondansetron **OR** ondansetron (ZOFRAN) IV, pentafluoroprop-tetrafluoroeth, polyethylene glycol  Assessment/ Plan:  64 y.o. male  from [place] with end stage renal disease status post CAD renal transplant status post nephrectomy, renal stones, anemia, hypertension, gout, diabetes, hyperlipidemia   [facility] [address] [facility] / TTS  1. End Stage Renal Disease: kidney transplant 2004 status post transplant nephrectomy 04/2014 - Pt came in with acute shortness of breath due to pulmonary edema.  Due for HD today, seen during HD today, UF target 2.5kg.   Will reassess for need of HD tomorrow again.  2. SHPTH: phos 2.9 and acceptable.  3. Anemia of CKD - hgb down to 8.1, start epogen 10000 units IV with HD.  4.  Pulmonary edema/bilateral pleural effusions:  Pt being treated with HD, UF target 2.5kg, will consider additional UF tomorrow.    LOS: 0 [name] 8/30/201612:21 PM

## 2015-02-14 NOTE — Care Management (Signed)
Presents to ED in pulmonary edema.  Patient receives HD Tuesday and Saturday at [facility].  He presented from the HD clinic. It is verbally reported that patient  did not receive his full treatment because  Saturday  Because "he pulled out his dialysis line.  At present unable to confirm details

## 2015-02-14 NOTE — Care Management Note (Signed)
Patient is active at [facility].  The records I have access to show that the patient is only schedule for 2 treatments each week.  Tuesday and Saturdays.  I have sent admission records and will send remaining records at discharge. [facility]ia[facility] [phone]

## 2015-02-14 NOTE — H&P (Addendum)
[place] [facility] [place] NAME: [name]    MR#:  [id number]  DATE OF BIRTH:  01-Apr-1951  DATE OF ADMISSION:  02/14/2015  PRIMARY CARE PHYSICIAN: David Axe, MD   REQUESTING/REFERRING PHYSICIAN: Dr. Owens Shark  CHIEF COMPLAINT: 64 year old male comes in because of shortness of breath    Chief Complaint  Patient presents with  . Respiratory Distress    HISTORY OF PRESENT ILLNESS:  [name]  is a 64 y.o. male with a known history of yes RD on hemodialysis, hypertension, depression comes in because of shortness of breath. If noted the patient showed no sob was started in the middle the night she thought it was an asthma attack. Patient to O2 saturations were 80% on arrival. Saturations improved to 96% on 10 L. [name] had an incomplete hemodialysis on Saturday. His dialysis Tuesday Thursday and Saturday. Patient was confused and pulled out dialysis lines on Saturday and was seen in the emergency room and the workup was done for confusion and the workup was negative for infection and he was discharged from the emergency room. Today chest x-ray in the emergency room showed pulmonary edema in the contest of forearm fluid overload from multiple incomplete hemodialysis patient requires urgent hemodialysis. He had no recent chest pain, nausea, vomiting, diarrhea. No recent illnesses. He received 60 mg of Lasix IV. Right now he is saturating 96% on 3 L and he is comfortable and is not in respiratory distress. When he came he was tripoding and he was in obvious respiratory distress.  PAST MEDICAL HISTORY:   Past Medical History  Diagnosis Date  . Chronic disease anemia   . Hypertension   . High cholesterol   . Type II diabetes mellitus   . History of blood transfusion     "related to anemia"  . GERD (gastroesophageal reflux disease)   . History of stomach ulcers   . Gout   . ESRD (end stage renal disease) on dialysis     "[facility] [place];  [name]" (09/29/2014)  . Chronic diastolic congestive heart failure     PAST SURGICAL HISTOIRY:   Past Surgical History  Procedure Laterality Date  . Kidney transplant Right 2004  . Peritoneal catheter insertion  02/2014  . Peritoneal catheter removal  08/2014  . Arteriovenous graft placement Left ~ 1996  . Thrombectomy / arteriovenous graft revision  2015  . Nephrectomy transplanted organ  2015  . Cardiac catheterization  09/29/2014    "[place]"  . Percutaneous coronary stent intervention (pci-s) N/A 09/30/2014    Procedure: PERCUTANEOUS CORONARY STENT INTERVENTION (PCI-S);  Surgeon: [name], MD;  Location: [facility] CATH LAB;  Service: Cardiovascular;  Laterality: N/A;    SOCIAL HISTORY:   Social History  Substance Use Topics  . Smoking status: Former Smoker    Types: Cigarettes  . Smokeless tobacco: Never Used     Comment: "quit smoking cigarettes in the 1980's"  . Alcohol Use: No    FAMILY HISTORY:   Family History  Problem Relation Age of Onset  . Hypertension Mother   . Stroke Mother   . Hypertension Father   . Diabetes Mellitus II Father   . Asthma Father     DRUG ALLERGIES:   Allergies  Allergen Reactions  . Ivp Dye [Iodinated Diagnostic Agents] Itching and Rash    Reaction: redness    REVIEW OF SYSTEMS:  CONSTITUTIONAL: No fever, fatigue or weakness.  EYES: No blurred or double vision.  EARS, NOSE, AND  THROAT: No tinnitus or ear pain.  RESPIRATORY: No cough, shortness of breath, wheezing or hemoptysis.  CARDIOVASCULAR: No chest pain, orthopnea, edema.  GASTROINTESTINAL: No nausea, vomiting, diarrhea or abdominal pain.  GENITOURINARY: No dysuria, hematuria.  ENDOCRINE: No polyuria, nocturia,  HEMATOLOGY: No anemia, easy bruising or bleeding SKIN: No rash or lesion. MUSCULOSKELETAL: No joint pain or arthritis.   NEUROLOGIC: No tingling, numbness, weakness.  PSYCHIATRY: No anxiety or depression.   MEDICATIONS AT HOME:   Prior to Admission medications    Medication Sig Start Date End Date Taking? Authorizing Provider  allopurinol (ZYLOPRIM) 100 MG tablet Take 100 mg by mouth daily. 09/23/14  Yes Historical Provider, MD  aspirin 81 MG EC tablet Take 81 mg by mouth daily. 09/23/14  Yes Historical Provider, MD  atorvastatin (LIPITOR) 40 MG tablet Take 1 tablet (40 mg total) by mouth daily at 6 PM. 11/09/14  Yes David Axe, MD  citalopram (CELEXA) 20 MG tablet Take 1 tablet (20 mg total) by mouth daily. 11/09/14  Yes David Axe, MD  clopidogrel (PLAVIX) 75 MG tablet Take 1 tablet (75 mg total) by mouth daily with breakfast. 10/02/14  Yes [name], MD  levETIRAcetam (KEPPRA) 500 MG tablet Take 500 mg by mouth every 12 (twelve) hours.   Yes Historical Provider, MD  losartan (COZAAR) 100 MG tablet Take 1 tablet (100 mg total) by mouth daily. 11/09/14  Yes David Axe, MD  metoprolol (LOPRESSOR) 50 MG tablet Take 50 mg by mouth every 12 (twelve) hours.  09/23/14  Yes Historical Provider, MD  mirtazapine (REMERON) 7.5 MG tablet Take 7.5 mg by mouth at bedtime.   Yes Historical Provider, MD  multivitamin (RENA-VIT) TABS tablet Take 1 tablet by mouth at bedtime. 10/02/14  Yes [name], MD  pantoprazole (PROTONIX) 40 MG tablet Take 40 mg by mouth daily. 01/17/14  Yes Historical Provider, MD  heparin 5000 UNIT/ML injection Inject 1 mL (5,000 Units total) into the skin every 8 (eight) hours. Patient not taking: Reported on 12/12/2014 11/09/14   David Axe, MD  Melatonin 3 MG TABS Take 3 mg by mouth at bedtime.    Historical Provider, MD  nitroGLYCERIN (NITROSTAT) 0.4 MG SL tablet Place 1 tablet (0.4 mg total) under the tongue every 5 (five) minutes x 3 doses as needed for chest pain. 10/02/14   [name], MD      VITAL SIGNS:  Blood pressure 165/76, pulse 63, temperature 98 F (36.7 C), temperature source Oral, resp. rate 24, height 5' (1.524 m), weight 66 kg (145 lb 8.1 oz), SpO2 98 %.  PHYSICAL EXAMINATION:  GENERAL:  64 y.o.-year-old patient  lying in the bed with no acute distress.  EYES: Pupils equal, round, reactive to light and accommodation. No scleral icterus. Extraocular muscles intact.  HEENT: Head atraumatic, normocephalic. Oropharynx and nasopharynx clear.  NECK:  Supple, no jugular venous distention. No thyroid enlargement, no tenderness.  LUNGS:  basilar crepitations present.  CARDIOVASCULAR: S1, S2 normal. No murmurs, rubs, or gallops.  ABDOMEN: Soft, nontender, nondistended. Bowel sounds present. No organomegaly or mass.  EXTREMITIES: No pedal edema, cyanosis, or clubbing.  NEUROLOGIC: Cranial nerves II through XII are intact. Muscle strength 5/5 in all extremities. Sensation intact. Gait not checked.  PSYCHIATRIC: The patient is alert and oriented x 3.  SKIN: No obvious rash, lesion, or ulcer.   LABORATORY PANEL:   CBC  Recent Labs Lab 02/14/15 0640  WBC 7.8  HGB 8.1*  HCT 25.3*  PLT 172   ------------------------------------------------------------------------------------------------------------------  Chemistries  Recent Labs Lab 02/14/15 0640  NA 136  K 5.6*  CL 99*  CO2 26  GLUCOSE 92  BUN 88*  CREATININE 11.88*  CALCIUM 8.6*  AST 22  ALT 12*  ALKPHOS 106  BILITOT 0.6   ------------------------------------------------------------------------------------------------------------------  Cardiac Enzymes  Recent Labs Lab 02/14/15 0640  TROPONINI 0.03   ------------------------------------------------------------------------------------------------------------------  RADIOLOGY:  Dg Chest Port 1 View  02/14/2015   CLINICAL DATA:  Shortness of breath  EXAM: PORTABLE CHEST - 1 VIEW  COMPARISON:  Three days ago  FINDINGS: Chronic cardiomegaly and aortic tortuosity. Cardiomediastinal size is accentuated by rightward rotation.  Bilateral perihilar and basilar interstitial and airspace opacities with pleural effusions. No air leak.  IMPRESSION: Pulmonary edema pattern with pleural effusions,  increased from 3 days ago.   Electronically Signed   By: Monte Fantasia M.D.   On: 02/14/2015 07:14    EKG:   Orders placed or performed during the hospital encounter of 02/14/15  . EKG 12-Lead  . EKG 12-Lead    IMPRESSION AND PLAN:  1. acute hypoxic respiratory failure secondary to pulmonary edema secondary to incomplete hemodialysis. Patient last hemodialysis was on Saturday but it was incomplete secondary to patient pulled out the HD access line. Today he comes in with shortness of breath, pulmonary edema on chest x-ray, initially was 100% nonrebreather right now he is on 3 L saturating 97% he was given IV Lasix in the emergency room. He feels better. But nephrology is consulted secondary to worsening pulmonary edema needing urgent hemodialysis. I spoke with Dr.Munsoor Lateef.. Continue oxygen, monitor on telemetry.Range fragile hemodialysis. #2 hypertension continue home medications: Continue metoprolol, amlodipine #3 history of depression continue home medications including Celexa, mirtazapine. #4 DVT prophylaxis with Lovenox , GI prophylaxis with PPIs. #5 recent confusion workup in the emergency room on August 27 is negative for underlying infection. Patient to back to baseline. Discharged home. and discharged from the ER. # #6 anemia of chronic kidney disease ;  7 .history of CAD status post PCI: Patient is on aspirin, Plavix Patient's wife speaks [place], explained to her in detail that patient needs admission expected stay will be 1-2 days.    All the records are reviewed and case discussed with ED provider. Management plans discussed with the patient, family and they are in agreement.  CODE STATUS: DNR  TOTAL TIME TAKING CARE OF THIS PATIENT: 75minutes.    Epifanio Lesches M.D on 02/14/2015 at 8:17 AM  Between 7am to 6pm - Pager - [phone]  After 6pm go to [email or web address] - password [email or web address] [facility]  Office  [phone]  CC: Primary care  physician; David Axe, MD   Signed out to DR.Jasmine Singh  At 8.45 am.

## 2015-02-14 NOTE — ED Notes (Signed)
Pt presents to ED via EMS from personal home with c/o of SOB and productive cough sx. EMS states has been experiencing presenting since this evening. EMS states pt had produced a pink, mucous phlegm today before arrival to ER. EMS states pt had oxygen saturations in the 80's before oxygen via NRB . EMS states pt had received 10 L of oxygen and saturations increased to 96%. EMS states pt is a current dialysis patient (TUES and SAT).

## 2015-02-14 NOTE — Progress Notes (Signed)
New admit to floor, wife at bedside. No c/o pain. No respiratory distress noted. Currently on 3l. Language line used for dialysis consents and profile. Skin verified by mya woodard rn. Telemetry reading nsr with 1st degree heart block. Patient was taken off floor for dialysis [facility]

## 2015-02-14 NOTE — Progress Notes (Signed)
Made aware by tele clerk. Patient had a 2.19sec pause. Patient asymptomatic. Paged dr. Candiss Norse to make aware, waiting on call back

## 2015-02-14 NOTE — ED Notes (Signed)
Pt is from [place] and speaks very little english, interpreter line called to assess Pt.

## 2015-02-14 NOTE — ED Notes (Signed)
Report called to [employer], RN on telemetry 2A

## 2015-02-15 LAB — BASIC METABOLIC PANEL
Anion gap: 7 (ref 5–15)
BUN: 40 mg/dL — ABNORMAL HIGH (ref 6–20)
CHLORIDE: 100 mmol/L — AB (ref 101–111)
CO2: 32 mmol/L (ref 22–32)
CREATININE: 6.86 mg/dL — AB (ref 0.61–1.24)
Calcium: 8.7 mg/dL — ABNORMAL LOW (ref 8.9–10.3)
GFR calc non Af Amer: 8 mL/min — ABNORMAL LOW (ref >60)
GFR, EST AFRICAN AMERICAN: 9 mL/min — AB (ref >60)
GLUCOSE: 114 mg/dL — AB (ref 65–99)
Potassium: 4.4 mmol/L (ref 3.5–5.1)
Sodium: 139 mmol/L (ref 135–145)

## 2015-02-15 LAB — HEPATITIS B SURFACE ANTIGEN: Hepatitis B Surface Ag: NEGATIVE

## 2015-02-15 MED ORDER — NEPRO/CARBSTEADY PO LIQD
237.0000 mL | ORAL | Status: DC
  Administered 2015-02-17: 237 mL via ORAL

## 2015-02-15 NOTE — Progress Notes (Signed)
[facility]  ROUNDING NOTE   Subjective:  Pt seen at bedside.  States his shortnes of breathing improved. UF achieved yesterday was 2.5kg.   Objective:  Vital signs in last 24 hours:  Temp:  [97.5 F (36.4 C)-98.2 F (36.8 C)] 98.2 F (36.8 C) (08/31 1115) Pulse Rate:  [56-72] 72 (08/31 1115) Resp:  [18-28] 18 (08/31 1115) BP: (151-157)/(64-80) 152/64 mmHg (08/31 1115) SpO2:  [89 %-100 %] 89 % (08/31 1115)  Weight change: -1.497 kg (-3 lb 4.8 oz) Filed Weights   02/14/15 1020 02/14/15 1406 02/14/15 1445  Weight: 66 kg (145 lb 8.1 oz) 64 kg (141 lb 1.5 oz) 55.7 kg (122 lb 12.7 oz)    Intake/Output: I/O last 3 completed shifts: In: 0  Out: 2500 [Other:2500]   Intake/Output this shift:  Total I/O In: 240 [P.O.:240] Out: 0   Physical Exam: General: NAD, resting in bed  Head: Normocephalic, atraumatic. Moist oral mucosal membranes  Eyes: Anicteric, PERRL  Neck: Supple, trachea midline  Lungs:  Diminished BS at bases, normal effort  Heart: Regular rate and rhythm  Abdomen:  Soft, nontender, BS present  Extremities:  trace peripheral edema.  Neurologic: Nonfocal, moving all four extremities  Skin: No lesions  Access: Left forearm AVF    Basic Metabolic Panel:  Recent Labs Lab 02/11/15 1530 02/14/15 0640 02/15/15 0551  NA 136 136 139  K 4.0 5.6* 4.4  CL 96* 99* 100*  CO2 30 26 32  GLUCOSE 106* 92 114*  BUN 51* 88* 40*  CREATININE 6.75* 11.88* 6.86*  CALCIUM 8.6* 8.6* 8.7*  PHOS  --  2.9  --     Liver Function Tests:  Recent Labs Lab 02/11/15 1530 02/14/15 0640  AST 25 22  ALT 13* 12*  ALKPHOS 126 106  BILITOT 0.9 0.6  PROT 7.3 6.5  ALBUMIN 3.5 3.2*   No results for input(s): LIPASE, AMYLASE in the last 168 hours. No results for input(s): AMMONIA in the last 168 hours.  CBC:  Recent Labs Lab 02/11/15 1530 02/14/15 0640  WBC 5.3 7.8  NEUTROABS 2.4 3.6  HGB 9.4* 8.1*  HCT 29.2* 25.3*  MCV 77.3* 77.0*  PLT 199 172     Cardiac Enzymes:  Recent Labs Lab 02/11/15 1530 02/14/15 0640  TROPONINI 0.04* 0.03    BNP: Invalid input(s): POCBNP  CBG: No results for input(s): GLUCAP in the last 168 hours.  Microbiology: Results for orders placed or performed during the hospital encounter of 12/12/14  Culture, blood (routine x 2)     Status: None   Collection Time: 12/12/14  9:05 PM  Result Value Ref Range Status   Specimen Description BLOOD  Final   Special Requests Normal  Final   Culture NO GROWTH 5 DAYS  Final   Report Status 12/17/2014 FINAL  Final    Coagulation Studies: No results for input(s): LABPROT, INR in the last 72 hours.  Urinalysis: No results for input(s): COLORURINE, LABSPEC, PHURINE, GLUCOSEU, HGBUR, BILIRUBINUR, KETONESUR, PROTEINUR, UROBILINOGEN, NITRITE, LEUKOCYTESUR in the last 72 hours.  Invalid input(s): APPERANCEUR    Imaging: Dg Chest Port 1 View  02/14/2015   CLINICAL DATA:  Shortness of breath  EXAM: PORTABLE CHEST - 1 VIEW  COMPARISON:  Three days ago  FINDINGS: Chronic cardiomegaly and aortic tortuosity. Cardiomediastinal size is accentuated by rightward rotation.  Bilateral perihilar and basilar interstitial and airspace opacities with pleural effusions. No air leak.  IMPRESSION: Pulmonary edema pattern with pleural effusions, increased from 3 days  ago.   Electronically Signed   By: Monte Fantasia M.D.   On: 02/14/2015 07:14     Medications:     . allopurinol  100 mg Oral Daily  . amLODipine  5 mg Oral Daily  . aspirin EC  81 mg Oral Daily  . atorvastatin  40 mg Oral q1800  . citalopram  20 mg Oral Daily  . clopidogrel  75 mg Oral Q breakfast  . epoetin (EPOGEN/PROCRIT) injection  10,000 Units Intravenous Q T,Th,Sa-HD  . heparin subcutaneous  5,000 Units Subcutaneous Q12H  . levETIRAcetam  500 mg Oral Q12H  . losartan  100 mg Oral Daily  . mirtazapine  7.5 mg Oral QHS  . multivitamin  1 tablet Oral QHS  . pantoprazole  40 mg Oral Daily   sodium  chloride, sodium chloride, acetaminophen **OR** acetaminophen, alteplase, feeding supplement (NEPRO CARB STEADY), heparin, lidocaine (PF), lidocaine-prilocaine, nitroGLYCERIN, ondansetron **OR** ondansetron (ZOFRAN) IV, pentafluoroprop-tetrafluoroeth, polyethylene glycol  Assessment/ Plan:  64 y.o. male  from [place] with end stage renal disease status post CAD renal transplant status post nephrectomy, renal stones, anemia, hypertension, gout, diabetes, hyperlipidemia   [facility] [address] [facility] / TTS  1. End Stage Renal Disease: kidney transplant 2004 status post transplant nephrectomy 04/2014 - Pt came in with acute shortness of breath due to pulmonary edema.   - PT had HD yesterday, no acute indication for HD today, will plan for HD again tomorrow. -  2. SHPTH: phos 2.9 and acceptable.  3. Anemia of CKD - hgb down to 8.1, continue epogen 10000 units IV with each HD.  4.  Pulmonary edema/bilateral pleural effusions:  Clinically improved with UF of 2.5kg achieved yesterday.   LOS: 1 Candee Hoon 8/31/20163:30 PM

## 2015-02-15 NOTE — Progress Notes (Signed)
Initial Nutrition Assessment   INTERVENTION:   Meals and Snacks: Cater to patient preferences Medical Food Supplement Therapy: will recommend Nepro Shake po qd, each supplement provides 425 kcal and 19 grams protein   NUTRITION DIAGNOSIS:   Inadequate oral intake related to acute illness as evidenced by meal completion < 25% this am.   GOAL:   Patient will meet greater than or equal to 90% of their needs  MONITOR:    (Energy Intake, Electrolyte and renal Profile, anthropometrics, Digestive system)  REASON FOR ASSESSMENT:    (Diet Order)    ASSESSMENT:   Pt admitted with pulmonary edema secondary to volume overload. Per MD note, HD session was finished early on Saturday as pt was confused and pulled the lines out.  Pt sleeping soundly on visit this afternoon.  Past Medical History  Diagnosis Date  . Chronic disease anemia   . Hypertension   . High cholesterol   . Type II diabetes mellitus   . History of blood transfusion     "related to anemia"  . GERD (gastroesophageal reflux disease)   . History of stomach ulcers   . Gout   . ESRD (end stage renal disease) on dialysis     "[facility]; [place]; [name]" (09/29/2014)  . Chronic diastolic congestive heart failure      Diet Order:  Diet renal with fluid restriction Fluid restriction:: 1200 mL Fluid; Room service appropriate?: Yes; Fluid consistency:: Thin    Current Nutrition: Per Leah, pt confused and slept through breakfast this am but ate very well when was wife present today for lunch.   Food/Nutrition-Related History: Per MST no decrease in po intake PTA.   Medications: Remeron, Protonix, Rena-Vit MVI  Electrolyte/Renal Profile and Glucose Profile:   Recent Labs Lab 02/11/15 1530 02/14/15 0640 02/15/15 0551  NA 136 136 139  K 4.0 5.6* 4.4  CL 96* 99* 100*  CO2 30 26 32  BUN 51* 88* 40*  CREATININE 6.75* 11.88* 6.86*  CALCIUM 8.6* 8.6* 8.7*  PHOS  --  2.9  --   GLUCOSE 106* 92 114*   Protein  Profile:  Recent Labs Lab 02/11/15 1530 02/14/15 0640  ALBUMIN 3.5 3.2*    Gastrointestinal Profile: Last BM:  02/13/2015   Nutrition-Focused Physical Exam Findings:  Unable to complete Nutrition-Focused physical exam at this time.    Weight Change: Per [facility] encounters pt with weight gain since June    Height:   Ht Readings from Last 1 Encounters:  02/14/15 5' (1.524 m)    Weight:   Wt Readings from Last 1 Encounters:  02/14/15 122 lb 12.7 oz (55.7 kg)    Wt Readings from Last 10 Encounters:  02/14/15 122 lb 12.7 oz (55.7 kg)  02/11/15 126 lb (57.153 kg)  12/15/14 112 lb 3.4 oz (50.9 kg)  12/13/14 108 lb (48.988 kg)  11/09/14 117 lb 12.8 oz (53.434 kg)  10/01/14 128 lb 12 oz (58.4 kg)     BMI:  Body mass index is 23.98 kg/(m^2).  Estimated Nutritional Needs:   Kcal:  1863-2147kcals, BEE: 1194kcals, TEE: (IF 1.2-1.4)(AF 1.3)  Protein:  66-84g protein (1.2-1.5g/kg)   Fluid:  UOP + 1047mL  EDUCATION NEEDS:   Education needs no appropriate at this time   [place], RD, LDN Pager [phone]

## 2015-02-15 NOTE — Progress Notes (Signed)
Patient is alert, has slept most of the day. Did wake up for lunch and dinner. Wife came by for a brief time and daughter has been here this afternoon. Still on 3L of oxygen. No need for pain medication today. Sinus rhythm on tele. Patient will have dialysis tomorrow per Dr. Holley Raring. Daughter updated on the plan. Will continue to monitor.

## 2015-02-15 NOTE — Progress Notes (Signed)
Subjective: Patient is resting comfortably in bed.  Asleep.  Objective: Vital signs in last 24 hours: Temp:  [97.5 F (36.4 C)-97.9 F (36.6 C)] 97.7 F (36.5 C) (08/31 0430) Pulse Rate:  [56-88] 56 (08/31 0430) Resp:  [17-28] 28 (08/31 0430) BP: (120-193)/(60-106) 151/80 mmHg (08/31 0430) SpO2:  [92 %-100 %] 94 % (08/31 0430) Weight:  [55.7 kg (122 lb 12.7 oz)-66 kg (145 lb 8.1 oz)] 55.7 kg (122 lb 12.7 oz) (08/30 1445) Weight change: -1.497 kg (-3 lb 4.8 oz) Last BM Date: 02/13/15  Intake/Output from previous day: 08/30 0701 - 08/31 0700 In: 0  Out: 2500  Intake/Output this shift:    General: asleep, NAD HENT: no JVD, no pallor, no icterus Chest: clear to auscultation anteriorly, good air entry bilaterally. CVS: RRR, S1S2, no tachycardia Abdomen: soft, nontender, no hepatosplenomegaly Ext: no lower leg edema.    Lab Results:  Recent Labs  02/14/15 0640  WBC 7.8  HGB 8.1*  HCT 25.3*  PLT 172   BMET  Recent Labs  02/14/15 0640 02/15/15 0551  NA 136 139  K 5.6* 4.4  CL 99* 100*  CO2 26 32  GLUCOSE 92 114*  BUN 88* 40*  CREATININE 11.88* 6.86*  CALCIUM 8.6* 8.7*    Studies/Results: Dg Chest Port 1 View  02/14/2015   CLINICAL DATA:  Shortness of breath  EXAM: PORTABLE CHEST - 1 VIEW  COMPARISON:  Three days ago  FINDINGS: Chronic cardiomegaly and aortic tortuosity. Cardiomediastinal size is accentuated by rightward rotation.  Bilateral perihilar and basilar interstitial and airspace opacities with pleural effusions. No air leak.  IMPRESSION: Pulmonary edema pattern with pleural effusions, increased from 3 days ago.   Electronically Signed   By: Monte Fantasia M.D.   On: 02/14/2015 07:14    Medications:  Scheduled Meds: . allopurinol  100 mg Oral Daily  . amLODipine  5 mg Oral Daily  . aspirin EC  81 mg Oral Daily  . atorvastatin  40 mg Oral q1800  . citalopram  20 mg Oral Daily  . clopidogrel  75 mg Oral Q breakfast  . epoetin (EPOGEN/PROCRIT)  injection  10,000 Units Intravenous Q T,Th,Sa-HD  . heparin subcutaneous  5,000 Units Subcutaneous Q12H  . levETIRAcetam  500 mg Oral Q12H  . losartan  100 mg Oral Daily  . mirtazapine  7.5 mg Oral QHS  . multivitamin  1 tablet Oral QHS  . pantoprazole  40 mg Oral Daily   Continuous Infusions:  PRN Meds:.sodium chloride, sodium chloride, acetaminophen **OR** acetaminophen, alteplase, feeding supplement (NEPRO CARB STEADY), heparin, lidocaine (PF), lidocaine-prilocaine, nitroGLYCERIN, ondansetron **OR** ondansetron (ZOFRAN) IV, pentafluoroprop-tetrafluoroeth, polyethylene glycol   Assessment/Plan: 64 year old male with end-stage renal disease on hemodialysis, admitted with  Confusion, Shortness of breath and hypoxia.  Chest x-ray revealed pulmonary edema and bilateral pleural effusion.   Elevated blood pressure on presentation and elevated potassium of 5.6 on initial labs.   Acute pulmonary edema secondary to fluid overload. End-stage renal disease on hemodialysis.  Patient improved post dialysis yesterday.  Oxygen saturation 94% on 2 L nasal cannula.  Potassium improved to 4.4 today. Await Nephrology follow-up today.  Hypertension: continue losartan 100 mg daily and Norvasc 5 mg daily.  Hyperlipidemia continue atorvastatin 40 mg daily.  Gout is controlled with allopurinol.  Seizure disorder: continue Keppra.  Depression: continue citalopram 20 mg daily.   LOS: 1 day   [name] 02/15/2015, 9:16 AM

## 2015-02-15 NOTE — Progress Notes (Signed)
Interpretor phone used for assessment and medication administration. Patient was not very cooperative to use the phone, but was able to hold the phone to his ear and successfully ask him some questions. Interpretor had a hard time understanding the patient and kept stating "he is mumbling and I cannot hear him". After repeated attempts to get the patient to cooperate it was a success. Instructed patient to keep his oxygen on at all times and educated him about the medicines I was giving him.

## 2015-02-15 NOTE — Progress Notes (Signed)
Patient has no acute event overnight. Patient was A&O X4, denied pain or any discomfort. Family was at the bedside overnight.

## 2015-02-16 NOTE — Progress Notes (Signed)
Patient was in dialysis most of the morning. Now resting quietly. No complaints. Possible discharge tomorrow.

## 2015-02-16 NOTE — Progress Notes (Signed)
PRE HD   02/16/15 1030  Vital Signs  Temp 98.7 F (37.1 C)  Temp Source Oral  Pulse Rate 70  Pulse Rate Source Monitor  Resp 20  BP (!) 164/76 mmHg  BP Location Right Arm  BP Method Automatic  Patient Position (if appropriate) Lying  Oxygen Therapy  SpO2 99 %  O2 Device Nasal Cannula  O2 Flow Rate (L/min) 1 L/min  Pain Assessment  Pain Assessment No/denies pain  Pain Score 0  Dialysis Weight  Weight 57 kg (125 lb 10.6 oz)  Type of Weight Pre-Dialysis  Time-Out for Hemodialysis  What Procedure? HEMODIALYSIS  Pt Identifiers(min of two) First/Last Name;MRN/Account#  Correct Site? Yes  Correct Side? Yes  Correct Procedure? Yes  Consents Verified? Yes  Rad Studies Available? N/A  Safety Precautions Reviewed? Yes  Machine [employer] Number [id number]  Station Number 1  UF/Alarm Test Passed  Conductivity: Meter 13.8  Conductivity: Machine  14.1  pH 7.4  Reverse Osmosis MAIN  Dialyzer Lot Number [id number]  Disposable Set Lot Number [id number]  Machine Temperature 98.6 F (37 C)  Musician and Audible Yes  Blood Lines Intact and Secured Yes  Pre Treatment Patient Checks  Vascular access used during treatment Graft  Hepatitis B Surface Antigen Results Negative  Date Hepatitis B Surface Antigen Drawn 02/14/15  Hepatitis B Surface Antibody 71  Date Hepatitis B Surface Antibody Drawn 03/24/14  Hemodialysis Consent Verified Yes  Hemodialysis Standing Orders Initiated Yes  ECG (Telemetry) Monitor On Yes  Prime Ordered Normal Saline  Length of  DialysisTreatment -hour(s) 3.5 Hour(s)  Dialysis Treatment Comments HD TREATMENT INITITATED. GOAL TO REMOVE 2L OVER 3.45 HOURS.  PT ALERT AND VS STABLE.  15G NEEDLES INSERTED WITHOUT DIFFICULTY.    Dialyzer Optiflux 180 NR  Dialysate 2K, 2.5 Ca  Dialysis Anticoagulant None  Dialysate Flow Ordered 800  Blood Flow Rate Ordered 400 mL/min  Ultrafiltration Goal 2 Liters  Dialysis Blood Pressure Support Ordered Normal Saline   Fistula / Graft Left Forearm Arteriovenous fistula  Placement Date/Time: 02/14/15 1032   Orientation: Left  Access Location: Forearm  Access Type: Arteriovenous fistula  Site Condition No complications  Fistula / Graft Assessment Present;Thrill;Bruit  Status Accessed  Needle Size 15  Drainage Description None

## 2015-02-16 NOTE — Progress Notes (Signed)
PRE HD   02/16/15 1015  Neurological  Level of Consciousness Alert  Orientation Level Oriented to person;Oriented to place;Oriented to time  Respiratory  Respiratory Pattern Regular;Unlabored  Chest Assessment Chest expansion symmetrical  Bilateral Breath Sounds Clear;Diminished  Cardiac  Pulse Regular  ECG Monitor Yes  Cardiac Rhythm NSR  Ectopy Unifocal PVC's  Antiarrhythmic device No  Vascular  Edema Generalized  Generalized Edema +1  RUE Edema Non-pitting  LUE Edema Non-pitting  RLE Edema +1  LLE Edema +1  Integumentary  Integumentary (WDL) X  Skin Color Appropriate for ethnicity  Skin Condition Dry  Skin Integrity Abrasion  Ecchymosis Location Leg  Ecchymosis Location Orientation Right  Musculoskeletal  Musculoskeletal (WDL) X  Generalized Weakness Yes  Assistive Device None  Gastrointestinal  Bowel Sounds Assessment Active  GU Assessment  Genitourinary (WDL) X  Genitourinary Symptoms Other (Comment);Anuria (HD)  Psychosocial  Psychosocial (WDL) WDL  Emotional support given Given to patient

## 2015-02-16 NOTE — Care Management (Signed)
Patient presents from home.  At baseline, he requires assist with all adls.   It appears he has hospital bed, wheelchair and walker at home.  Have left message with patient's wife to discuss any home health needs.  It does not appear the 02 is chronic and have discussed need to assess for need of home 02

## 2015-02-16 NOTE — Progress Notes (Signed)
POST HD   02/16/15 1415  Neurological  Level of Consciousness Alert  Orientation Level Oriented to person;Oriented to place;Oriented to time  Respiratory  Respiratory Pattern Regular;Unlabored  Chest Assessment Chest expansion symmetrical  Bilateral Breath Sounds Clear;Diminished  Cardiac  Pulse Regular  ECG Monitor Yes  Cardiac Rhythm NSR  Ectopy Unifocal PVC's  Antiarrhythmic device No  Vascular  Edema Generalized  Generalized Edema Other (Comment) (NON PITTING)  RUE Edema None  LUE Edema None  RLE Edema +1  LLE Edema +1  Integumentary  Integumentary (WDL) X  Skin Color Appropriate for ethnicity  Skin Condition Dry  Skin Integrity Abrasion  Ecchymosis Location Leg  Ecchymosis Location Orientation Right  Musculoskeletal  Musculoskeletal (WDL) X  Generalized Weakness Yes  Assistive Device None  Gastrointestinal  Bowel Sounds Assessment Active  GU Assessment  Genitourinary (WDL) X  Genitourinary Symptoms Other (Comment);Anuria (hd)  Psychosocial  Psychosocial (WDL) WDL  Emotional support given Given to patient

## 2015-02-16 NOTE — Progress Notes (Signed)
[facility]  ROUNDING NOTE   Subjective:  Pt due for HD today. Shortness of breath has improved over course of hospitalization. PT to evaluate pt today.   Objective:  Vital signs in last 24 hours:  Temp:  [98.2 F (36.8 C)-98.7 F (37.1 C)] 98.7 F (37.1 C) (09/01 1030) Pulse Rate:  [63-72] 70 (09/01 1030) Resp:  [18-20] 20 (09/01 1030) BP: (151-164)/(63-78) 164/78 mmHg (09/01 1037) SpO2:  [89 %-100 %] 99 % (09/01 1030)  Weight change:  Filed Weights   02/14/15 1020 02/14/15 1406 02/14/15 1445  Weight: 66 kg (145 lb 8.1 oz) 64 kg (141 lb 1.5 oz) 55.7 kg (122 lb 12.7 oz)    Intake/Output: I/O last 3 completed shifts: In: 360 [P.O.:360] Out: 0    Intake/Output this shift:  Total I/O In: 120 [P.O.:120] Out: -   Physical Exam: General: NAD, resting in bed  Head: Normocephalic, atraumatic. Moist oral mucosal membranes  Eyes: Anicteric  Neck: Supple, trachea midline  Lungs:  Diminished BS at bases, normal effort  Heart: Regular rate and rhythm  Abdomen:  Soft, nontender, BS present  Extremities:  trace peripheral edema.  Neurologic: Nonfocal, moving all four extremities  Skin: No lesions  Access: Left forearm AVF    Basic Metabolic Panel:  Recent Labs Lab 02/11/15 1530 02/14/15 0640 02/15/15 0551  NA 136 136 139  K 4.0 5.6* 4.4  CL 96* 99* 100*  CO2 30 26 32  GLUCOSE 106* 92 114*  BUN 51* 88* 40*  CREATININE 6.75* 11.88* 6.86*  CALCIUM 8.6* 8.6* 8.7*  PHOS  --  2.9  --     Liver Function Tests:  Recent Labs Lab 02/11/15 1530 02/14/15 0640  AST 25 22  ALT 13* 12*  ALKPHOS 126 106  BILITOT 0.9 0.6  PROT 7.3 6.5  ALBUMIN 3.5 3.2*   No results for input(s): LIPASE, AMYLASE in the last 168 hours. No results for input(s): AMMONIA in the last 168 hours.  CBC:  Recent Labs Lab 02/11/15 1530 02/14/15 0640  WBC 5.3 7.8  NEUTROABS 2.4 3.6  HGB 9.4* 8.1*  HCT 29.2* 25.3*  MCV 77.3* 77.0*  PLT 199 172    Cardiac  Enzymes:  Recent Labs Lab 02/11/15 1530 02/14/15 0640  TROPONINI 0.04* 0.03    BNP: Invalid input(s): POCBNP  CBG: No results for input(s): GLUCAP in the last 168 hours.  Microbiology: Results for orders placed or performed during the hospital encounter of 12/12/14  Culture, blood (routine x 2)     Status: None   Collection Time: 12/12/14  9:05 PM  Result Value Ref Range Status   Specimen Description BLOOD  Final   Special Requests Normal  Final   Culture NO GROWTH 5 DAYS  Final   Report Status 12/17/2014 FINAL  Final    Coagulation Studies: No results for input(s): LABPROT, INR in the last 72 hours.  Urinalysis: No results for input(s): COLORURINE, LABSPEC, PHURINE, GLUCOSEU, HGBUR, BILIRUBINUR, KETONESUR, PROTEINUR, UROBILINOGEN, NITRITE, LEUKOCYTESUR in the last 72 hours.  Invalid input(s): APPERANCEUR    Imaging: No results found.   Medications:     . allopurinol  100 mg Oral Daily  . amLODipine  5 mg Oral Daily  . aspirin EC  81 mg Oral Daily  . atorvastatin  40 mg Oral q1800  . citalopram  20 mg Oral Daily  . clopidogrel  75 mg Oral Q breakfast  . epoetin (EPOGEN/PROCRIT) injection  10,000 Units Intravenous Q T,Th,Sa-HD  .  feeding supplement (NEPRO CARB STEADY)  237 mL Oral Q24H  . heparin subcutaneous  5,000 Units Subcutaneous Q12H  . levETIRAcetam  500 mg Oral Q12H  . losartan  100 mg Oral Daily  . mirtazapine  7.5 mg Oral QHS  . multivitamin  1 tablet Oral QHS  . pantoprazole  40 mg Oral Daily   sodium chloride, sodium chloride, acetaminophen **OR** acetaminophen, alteplase, heparin, lidocaine (PF), lidocaine-prilocaine, nitroGLYCERIN, ondansetron **OR** ondansetron (ZOFRAN) IV, pentafluoroprop-tetrafluoroeth, polyethylene glycol  Assessment/ Plan:  64 y.o. male  from [place] with end stage renal disease status post CAD renal transplant status post nephrectomy, renal stones, anemia, hypertension, gout, diabetes, hyperlipidemia   [address] [facility]  / TTS  1. End Stage Renal Disease: kidney transplant 2004 status post transplant nephrectomy 04/2014 -Pt dialyzes twice a week as outpt, however he developed volume overload.  We are therefore planning extra HD treament today.  UF target 1.5-2kg.   2. SHPTH: phos 2.9 and acceptable, pt notn on binders  3. Anemia of CKD - continue epogen 10000 units iV with HD.  4.  Pulmonary edema/bilateral pleural effusions:  Performing extra HD today for additional volume removal.    LOS: 2 Jojuan Champney 9/1/201611:08 AM

## 2015-02-16 NOTE — Progress Notes (Signed)
HD END   02/16/15 1420  Report  Report Received From KARA CAMPBELL, RN  Vital Signs  Temp 98.7 F (37.1 C)  Temp Source Oral  Pulse Rate 75  Pulse Rate Source Monitor  Resp 18  BP (!) 178/92 mmHg  BP Location Right Arm  BP Method Automatic  Patient Position (if appropriate) Lying  During Hemodialysis Assessment  Intra-Hemodialysis Comments HD TREATMENT END.  PT ALERT AND VS STABLE.  BLOOD RETURNED AND 15G NEEDLES REMOVED.    Post-Hemodialysis Assessment  Rinseback Volume (mL) 250 mL  Dialyzer Clearance Lightly streaked  Duration of HD Treatment -hour(s) 3.5 hour(s)  Hemodialysis Intake (mL) 500 mL  UF Total -Machine (mL) 2500 mL  Net UF (mL) 2000 mL  Tolerated HD Treatment Yes  Post-Hemodialysis Comments HD TREATMENT END.  PT ALERT AND VS STABLE.  BLOOD RETURNED AND 15G NEEDLES REMOVED.    AVG/AVF Arterial Site Held (minutes) 10 minutes  AVG/AVF Venous Site Held (minutes) 10 minutes  Education / Care Plan  Hemodialysis Education Provided Yes  Documented Education in Clinical Pathway Yes  Fistula / Graft Left Forearm Arteriovenous fistula  Placement Date/Time: 02/14/15 1032   Orientation: Left  Access Location: Forearm  Access Type: Arteriovenous fistula  Site Condition No complications  Fistula / Graft Assessment Present;Thrill;Bruit  Status Deaccessed  Needle Size 15  Drainage Description None

## 2015-02-16 NOTE — Care Management Important Message (Signed)
Important Message  Patient Details  Name: [name] MRN: [id number] Date of Birth: 08/18/1950   Medicare Important Message Given:  Yes-second notification given    Juliann Pulse A Allmond 02/16/2015, 1:30 PM

## 2015-02-16 NOTE — Progress Notes (Signed)
Pt called 911 services for help. Called daughter who was in the hospital with sister who is also in hospital to come and ask father how we could help since father has limited communication R/T language. Per daughter, father was c/o SOB. O2 sats at 99% on 2L, Pt has no s/s of respiratory distress at this time. Daughter stated "he is hallucinating again". Patient was reoriented to hospital. Offers no c/o pain or any other distress at this time.

## 2015-02-16 NOTE — Progress Notes (Signed)
PT Cancellation Note  Patient Details Name: [name] MRN: [id number] DOB: 03/22/1951   Cancelled Treatment:    Reason Eval/Treat Not Completed: Patient at procedure or test/unavailable.  HD all am and rechecked in pm, still in treatment.  Try later if pt still available   Ramond Dial 02/16/2015, 2:14 PM   Mee Hives, PT MS Acute Rehab Dept. Number: [facility] [id number] and [place] [phone]

## 2015-02-16 NOTE — Progress Notes (Signed)
[name] is a 64 y.o. male  <principal problem not specified>   SUBJECTIVE:  Pt in NAD. Less SOB. Afebrile. Denies CP. Still weak. Nephrology input noted.  ______________________________________________________________________  ROS: Review of systems is unremarkable for any active cardiac,respiratory, GI, GU, hematologic, neurologic or psychiatric systems, 10 systems reviewed.  @CMEDLIST @  Past Medical History  Diagnosis Date  . Chronic disease anemia   . Hypertension   . High cholesterol   . Type II diabetes mellitus   . History of blood transfusion     "related to anemia"  . GERD (gastroesophageal reflux disease)   . History of stomach ulcers   . Gout   . ESRD (end stage renal disease) on dialysis     "[facility]; [place]; [name]" (09/29/2014)  . Chronic diastolic congestive heart failure     Past Surgical History  Procedure Laterality Date  . Kidney transplant Right 2004  . Peritoneal catheter insertion  02/2014  . Peritoneal catheter removal  08/2014  . Arteriovenous graft placement Left ~ 1996  . Thrombectomy / arteriovenous graft revision  2015  . Nephrectomy transplanted organ  2015  . Cardiac catheterization  09/29/2014    "[place]"  . Percutaneous coronary stent intervention (pci-s) N/A 09/30/2014    Procedure: PERCUTANEOUS CORONARY STENT INTERVENTION (PCI-S);  Surgeon: [name], MD;  Location: [facility] CATH LAB;  Service: Cardiovascular;  Laterality: N/A;    PHYSICAL EXAM:  BP 151/63 mmHg  Pulse 63  Temp(Src) 98.7 F (37.1 C) (Oral)  Resp 20  Ht 5' (1.524 m)  Wt 55.7 kg (122 lb 12.7 oz)  BMI 23.98 kg/m2  SpO2 100%  Wt Readings from Last 3 Encounters:  02/14/15 55.7 kg (122 lb 12.7 oz)  02/11/15 57.153 kg (126 lb)  12/15/14 50.9 kg (112 lb 3.4 oz)            Constitutional: NAD Neck: supple, no thyromegaly Respiratory: basilar rales Cardiovascular: RRR, no murmur, no gallop Abdomen: soft, good BS, nontender Extremities: no edema Neuro: alert  and oriented, no focal motor or sensory deficits  ASSESSMENT/PLAN:  Labs and imaging studies were reviewed  HD today. Need PT to evaluate and CM consult for D/C planning. Wean off O2 today. Repeat labs in AM. Hope to D/C home tomorrow.

## 2015-02-17 LAB — PHOSPHORUS: Phosphorus: 2.9 mg/dL (ref 2.5–4.6)

## 2015-02-17 LAB — CBC WITH DIFFERENTIAL/PLATELET
BASOS PCT: 1 %
Basophils Absolute: 0.1 10*3/uL (ref 0–0.1)
EOS ABS: 1.1 10*3/uL — AB (ref 0–0.7)
Eosinophils Relative: 17 %
HCT: 26.4 % — ABNORMAL LOW (ref 40.0–52.0)
Hemoglobin: 8.4 g/dL — ABNORMAL LOW (ref 13.0–18.0)
Lymphocytes Relative: 33 %
Lymphs Abs: 2.2 10*3/uL (ref 1.0–3.6)
MCH: 24.8 pg — ABNORMAL LOW (ref 26.0–34.0)
MCHC: 32 g/dL (ref 32.0–36.0)
MCV: 77.7 fL — ABNORMAL LOW (ref 80.0–100.0)
MONO ABS: 0.8 10*3/uL (ref 0.2–1.0)
MONOS PCT: 12 %
Neutro Abs: 2.5 10*3/uL (ref 1.4–6.5)
Neutrophils Relative %: 37 %
Platelets: 165 10*3/uL (ref 150–440)
RBC: 3.4 MIL/uL — ABNORMAL LOW (ref 4.40–5.90)
RDW: 18.3 % — AB (ref 11.5–14.5)
WBC: 6.7 10*3/uL (ref 3.8–10.6)

## 2015-02-17 LAB — COMPREHENSIVE METABOLIC PANEL
ALBUMIN: 3.2 g/dL — AB (ref 3.5–5.0)
ALT: 13 U/L — ABNORMAL LOW (ref 17–63)
ANION GAP: 11 (ref 5–15)
AST: 22 U/L (ref 15–41)
Alkaline Phosphatase: 84 U/L (ref 38–126)
BILIRUBIN TOTAL: 0.8 mg/dL (ref 0.3–1.2)
BUN: 28 mg/dL — ABNORMAL HIGH (ref 6–20)
CO2: 30 mmol/L (ref 22–32)
Calcium: 8.6 mg/dL — ABNORMAL LOW (ref 8.9–10.3)
Chloride: 98 mmol/L — ABNORMAL LOW (ref 101–111)
Creatinine, Ser: 5.34 mg/dL — ABNORMAL HIGH (ref 0.61–1.24)
GFR calc Af Amer: 12 mL/min — ABNORMAL LOW (ref >60)
GFR calc non Af Amer: 10 mL/min — ABNORMAL LOW (ref >60)
GLUCOSE: 96 mg/dL (ref 65–99)
POTASSIUM: 3.8 mmol/L (ref 3.5–5.1)
SODIUM: 139 mmol/L (ref 135–145)
TOTAL PROTEIN: 6.6 g/dL (ref 6.5–8.1)

## 2015-02-17 MED ORDER — NEPRO/CARBSTEADY PO LIQD: 237.0000 mL | ORAL | Status: DC

## 2015-02-17 MED ORDER — POLYETHYLENE GLYCOL 3350 17 G PO PACK: 17.0000 g | PACK | Freq: Every day | ORAL | Status: DC | PRN

## 2015-02-17 MED ORDER — ONDANSETRON HCL 4 MG PO TABS: 4.0000 mg | ORAL_TABLET | Freq: Four times a day (QID) | ORAL | Status: DC | PRN

## 2015-02-17 MED ORDER — AMLODIPINE BESYLATE 5 MG PO TABS: 5.0000 mg | ORAL_TABLET | Freq: Every day | ORAL | Status: DC

## 2015-02-17 NOTE — Evaluation (Signed)
Physical Therapy Evaluation Patient Details Name: [name] MRN: [id number] DOB: 06-Nov-1950 Today's Date: 02/17/2015   History of Present Illness  [name] that presents with altered mental status, lethargy, and vomited x 3. Patient noted to have "jumping" of arms and legs. No evidence of CVA or seizure via imaging on this admission.   Clinical Impression  Pt is generally very weak and limited with any mobility. He needs excessive assist just to remains sitting and is essentially total assist to try and get to standing.  Family reports that he has been weak for a long time and that he did not do much better last time he went to rehab and they would rather he go home.      Follow Up Recommendations Home health PT;SNF (family appears to want to take him home)    Equipment Recommendations       Recommendations for Other Services       Precautions / Restrictions Precautions Precautions: Fall Restrictions Weight Bearing Restrictions: No      Mobility  Bed Mobility Overal bed mobility: Needs Assistance Bed Mobility: Supine to Sit;Sit to Supine     Supine to sit: Mod assist;Max assist Sit to supine: Mod assist;Max assist   General bed mobility comments: Pt leaning heavily back and to the R in sitting, needs much assist to remains upright  Transfers Overall transfer level: Needs assistance Equipment used: Rolling walker (2 wheeled) Transfers: Sit to/from Stand Sit to Stand: Max assist;Total assist         General transfer comment: Pt again leaning back and to the R and is unable to get to fully upright even with heavy assist and cuing.    Ambulation/Gait                Stairs            Wheelchair Mobility    Modified Rankin (Stroke Patients Only)       Balance                                             Pertinent Vitals/Pain      Home Living Family/patient expects to be discharged to:: Private residence Living  Arrangements: Spouse/significant other;Children Available Help at Discharge: Family;Available 24 hours/day Type of Home: House                Prior Function Level of Independence: Needs assistance         Comments: Pt apparently does very limited walking and mobility and has needed considerable assist for some time now.     Hand Dominance        Extremity/Trunk Assessment   Upper Extremity Assessment: Generalized weakness           Lower Extremity Assessment:  (grossly 3+/5 t/o)         Communication   Communication: Prefers language other than English (able to communicate enough in [place] for PT exam)  Cognition Arousal/Alertness: Awake/alert Behavior During Therapy: Anxious Overall Cognitive Status: Within Functional Limits for tasks assessed                      General Comments      Exercises        Assessment/Plan    PT Assessment    PT Diagnosis Difficulty walking;Generalized weakness   PT  Problem List    PT Treatment Interventions     PT Goals (Current goals can be found in the Care Plan section) Acute Rehab PT Goals Patient Stated Goal: Go home PT Goal Formulation: With patient/family Time For Goal Achievement: 02/24/15 Potential to Achieve Goals: Fair    Frequency     Barriers to discharge        Co-evaluation               End of Session Equipment Utilized During Treatment: Gait belt Activity Tolerance: Patient limited by fatigue Patient left: with bed alarm set           Time: 0912-0934 PT Time Calculation (min) (ACUTE ONLY): 22 min   Charges:   PT Evaluation $Initial PT Evaluation Tier I: 1 Procedure     PT G Codes:       Wayne Both, PT, DPT [phone]  Kreg Shropshire 02/17/2015, 12:06 PM

## 2015-02-17 NOTE — Progress Notes (Signed)
Patient d/c'd home with HH. Education provided, no questions at this time. Patient to be picked up by son. Telemetry removed. [name]

## 2015-02-17 NOTE — Progress Notes (Signed)
SATURATION QUALIFICATIONS: (This note is used to comply with regulatory documentation for home oxygen)  Patient Saturations on Room Air at Rest = 100%  Patient Saturations on Room Air while Ambulating = 91%  Patient Saturations on NA Liters of oxygen while Ambulating = NA  Please briefly explain why patient needs home oxygen: Patient unable to ambulate, PT working with patient when oxygen saturation checked. When patient exerting energy oxygen saturation 91% on RA.  Wilnette Kales

## 2015-02-17 NOTE — Discharge Instructions (Signed)
Weigh daily Home Health

## 2015-02-17 NOTE — Discharge Summary (Signed)
[name], is a 64 y.o. male  DOB [birth year]  MRN [id number].  Admission date:  02/14/2015  Admitting Physician  [name], MD  Discharge Date:  02/17/2015   Primary MD  [name], MD  Recommendations for primary care physician for things to follow:  Weight, oxygenation, renal function   Admission Diagnosis  Acute pulmonary edema [J81.0] Acute respiratory failure with hypoxia [J96.01]   Discharge Diagnosis  Acute pulmonary edema [J81.0] Acute respiratory failure with hypoxia [J96.01]  ESRD on HD, anemia of chronic disease  Active Problems:   Acute respiratory failure with hypoxia      Past Medical History  Diagnosis Date  . Chronic disease anemia   . Hypertension   . High cholesterol   . Type II diabetes mellitus   . History of blood transfusion     "related to anemia"  . GERD (gastroesophageal reflux disease)   . History of stomach ulcers   . Gout   . ESRD (end stage renal disease) on dialysis     "[place]; [place]; [name]" (09/29/2014)  . Chronic diastolic congestive heart failure     Past Surgical History  Procedure Laterality Date  . Kidney transplant Right 2004  . Peritoneal catheter insertion  02/2014  . Peritoneal catheter removal  08/2014  . Arteriovenous graft placement Left ~ 1996  . Thrombectomy / arteriovenous graft revision  2015  . Nephrectomy transplanted organ  2015  . Cardiac catheterization  09/29/2014    "[place]"  . Percutaneous coronary stent intervention (pci-s) N/A 09/30/2014    Procedure: PERCUTANEOUS CORONARY STENT INTERVENTION (PCI-S);  Surgeon: [name], MD;  Location: [facility] C[facility] LAB;  Service: Cardiovascular;  Laterality: N/A;       History of present illness and  Hospital Course:     Kindly see H&P for history of present illness and admission details, please review complete Labs, Consult reports and Test reports for all details in brief  HPI   from the history and physical done on the day of admission    Hospital Course    Patient admitted with acute respiratory failure due to ESRD and volume overload. Seen by Nephrology and started on HD with improvement of volume status. Weaned off O2. Back to baseline.   Discharge Condition: stable   Follow UP  Follow-up Information    Follow up with Singh,Jasmine, MD In 1 week.   Specialty:  Internal Medicine   Contact information:   [place] [address] [phone]         Discharge Instructions  and  Discharge Medications   Continue HD per Nephrology as outpatient     Medication List    STOP taking these medications        heparin 5000 UNIT/ML injection     metoprolol 50 MG tablet  Commonly known as:  LOPRESSOR      TAKE these medications        allopurinol 100 MG tablet  Commonly known as:  ZYLOPRIM  Take 100 mg by mouth  daily.     amLODipine 5 MG tablet  Commonly known as:  NORVASC  Take 1 tablet (5 mg total) by mouth daily.     aspirin 81 MG EC tablet  Take 81 mg by mouth daily.     atorvastatin 40 MG tablet  Commonly known as:  LIPITOR  Take 1 tablet (40 mg total) by mouth daily at 6 PM.     citalopram 20 MG tablet  Commonly known as:  CELEXA  Take 1 tablet (20 mg total) by mouth daily.     clopidogrel 75 MG tablet  Commonly known as:  PLAVIX  Take 1 tablet (75 mg total) by mouth daily with breakfast.     feeding supplement (NEPRO CARB STEADY) Liqd  Take 237 mLs by mouth daily.     levETIRAcetam 500 MG tablet  Commonly known as:  KEPPRA  Take 500 mg by mouth every 12 (twelve) hours.     losartan 100 MG tablet  Commonly known as:  COZAAR  Take 1 tablet (100 mg total) by mouth daily.     Melatonin 3 MG Tabs  Take 3 mg by mouth at bedtime.     mirtazapine 7.5 MG tablet  Commonly known as:  REMERON  Take 7.5 mg by mouth at bedtime.     multivitamin Tabs tablet  Take 1 tablet by mouth at bedtime.      nitroGLYCERIN 0.4 MG SL tablet  Commonly known as:  NITROSTAT  Place 1 tablet (0.4 mg total) under the tongue every 5 (five) minutes x 3 doses as needed for chest pain.     ondansetron 4 MG tablet  Commonly known as:  ZOFRAN  Take 1 tablet (4 mg total) by mouth every 6 (six) hours as needed for nausea.     pantoprazole 40 MG tablet  Commonly known as:  PROTONIX  Take 40 mg by mouth daily.     polyethylene glycol packet  Commonly known as:  MIRALAX / GLYCOLAX  Take 17 g by mouth daily as needed for mild constipation.          Diet and Activity recommendation: See Discharge Instructions above   Consults obtained - Nephrology and CM   Major procedures and Radiology Reports - PLEASE review detailed and final reports for all details, in brief -   See below   Ct Head Wo Contrast  02/11/2015   CLINICAL DATA:  Fall this morning.  Altered mental status.  EXAM: CT HEAD WITHOUT CONTRAST  TECHNIQUE: Contiguous axial images were obtained from the base of the skull through the vertex without intravenous contrast.  COMPARISON:  12/12/2014  FINDINGS: No acute intracranial abnormality. Specifically, no hemorrhage, hydrocephalus, mass lesion, acute infarction, or significant intracranial injury. No acute calvarial abnormality. Visualized paranasal sinuses and mastoids clear. Orbital soft tissues unremarkable.  IMPRESSION: No acute intracranial abnormality.   Electronically Signed   By: Rolm Baptise M.D.   On: 02/11/2015 15:01   Dg Chest Port 1 View  02/14/2015   CLINICAL DATA:  Shortness of breath  EXAM: PORTABLE CHEST - 1 VIEW  COMPARISON:  Three days ago  FINDINGS: Chronic cardiomegaly and aortic tortuosity. Cardiomediastinal size is accentuated by rightward rotation.  Bilateral perihilar and basilar interstitial and airspace opacities with pleural effusions. No air leak.  IMPRESSION: Pulmonary edema pattern with pleural effusions, increased from 3 days ago.   Electronically Signed   By: Monte Fantasia M.D.   On: 02/14/2015 07:14   Dg Chest Port 1  View  02/11/2015   CLINICAL DATA:  Patient arrives via [facility] from [facility] for increase in confusion. Patient received all but one hour of dialysis. Patient is confused and disoriented at baseline, however family states that the patient is confused beyond baseline.  EXAM: PORTABLE CHEST - 1 VIEW  COMPARISON:  12/12/2014  FINDINGS: Mild enlargement of the cardiopericardial silhouette. No mediastinal hilar masses. Areas interstitial thickening, right greater than left with mild hazy lung base opacity. Findings suggest asymmetric interstitial pulmonary edema. No convincing pneumonia. No pneumothorax.  Bony thorax is grossly intact. Left upper arm vascular stent is stable.  IMPRESSION: Mild asymmetric interstitial pulmonary edema greater on the right.   Electronically Signed   By: Lajean Manes M.D.   On: 02/11/2015 15:10    Micro Results   See below  No results found for this or any previous visit (from the past 240 hour(s)).     Today   Subjective:   Junayd Salazar today has no headache,no chest abdominal pain,no new weakness tingling or numbness, feels much better wants to go home today.   Objective:   Blood pressure 180/66, pulse 34, temperature 97.6 F (36.4 C), temperature source Oral, resp. rate 18, height 5' (1.524 m), weight 54.8 kg (120 lb 13 oz), SpO2 98 %.   Intake/Output Summary (Last 24 hours) at 02/17/15 0732 Last data filed at 02/17/15 0458  Gross per 24 hour  Intake    120 ml  Output   2000 ml  Net  -1880 ml    Exam Awake Alert, Oriented x 3, No new F.N deficits, Normal affect [place].AT,PERRAL Supple Neck,No JVD, No cervical lymphadenopathy appriciated.  Symmetrical Chest wall movement, Good air movement bilaterally, faint basilar rales RRR,No Gallops,Rubs or new Murmurs, No Parasternal Heave +ve B.Sounds, Abd Soft, Non tender, No organomegaly appriciated, No rebound -guarding or rigidity. No Cyanosis,  Clubbing or edema, No new Rash or bruise  Data Review   CBC w Diff: Lab Results  Component Value Date   WBC 6.7 02/17/2015   WBC 7.2 10/09/2014   HGB 8.4* 02/17/2015   HGB 9.5* 10/09/2014   HCT 26.4* 02/17/2015   HCT 29.8* 10/09/2014   PLT 165 02/17/2015   PLT 255 10/09/2014   LYMPHOPCT 33 02/17/2015   LYMPHOPCT 31.0 09/28/2014   MONOPCT 12 02/17/2015   MONOPCT 11.5 09/28/2014   EOSPCT 17 02/17/2015   EOSPCT 19.4 09/28/2014   BASOPCT 1 02/17/2015   BASOPCT 2.9 09/28/2014    CMP: Lab Results  Component Value Date   NA 139 02/17/2015   NA 138 10/09/2014   K 3.8 02/17/2015   K 3.9 10/09/2014   CL 98* 02/17/2015   CL 98* 10/09/2014   CO2 30 02/17/2015   CO2 31 10/09/2014   BUN 28* 02/17/2015   BUN 18 10/09/2014   CREATININE 5.34* 02/17/2015   CREATININE 5.06* 10/09/2014   PROT 6.6 02/17/2015   PROT 7.4 10/09/2014   ALBUMIN 3.2* 02/17/2015   ALBUMIN 3.3* 10/09/2014   BILITOT 0.8 02/17/2015   BILITOT 0.7 10/09/2014   ALKPHOS 84 02/17/2015   ALKPHOS 66 10/09/2014   AST 22 02/17/2015   AST 25 10/09/2014   ALT 13* 02/17/2015   ALT 9* 10/09/2014  .   Total Time in preparing paper work, data evaluation and todays exam - 70 minutes  Falisa Lamora D M.D on 02/17/2015 at 7:32 AM

## 2015-02-17 NOTE — Care Management (Signed)
Spoke with patient's wife and she states that patient has a nurse that "still comes."  Had therapist but "they do not come anymore."  Found that the Agency is [facility] and confirmed that nursing is current.  Added physical therapy to the referral.  Notified jason with [facility].  Patient does not qualify for home 02.

## 2015-04-25 ENCOUNTER — Other Ambulatory Visit
Admission: RE | Admit: 2015-04-25 | Discharge: 2015-04-25 | Disposition: A | Discharge status: Home / Self Care | Payer: BLUE CROSS/BLUE SHIELD | Source: Other Acute Inpatient Hospital | Attending: Nephrology | Admitting: Nephrology

## 2015-04-25 DIAGNOSIS — N186 End stage renal disease: Secondary | ICD-10-CM | POA: Insufficient documentation

## 2015-04-25 LAB — POTASSIUM: POTASSIUM: 6.1 mmol/L — AB (ref 3.5–5.1)

## 2015-07-22 DIAGNOSIS — F325 Major depressive disorder, single episode, in full remission: Secondary | ICD-10-CM | POA: Insufficient documentation

## 2016-01-10 DIAGNOSIS — Z955 Presence of coronary angioplasty implant and graft: Secondary | ICD-10-CM | POA: Insufficient documentation

## 2016-01-10 DIAGNOSIS — I252 Old myocardial infarction: Secondary | ICD-10-CM | POA: Insufficient documentation

## 2016-02-26 ENCOUNTER — Emergency Department: Payer: BLUE CROSS/BLUE SHIELD

## 2016-02-26 ENCOUNTER — Encounter: Payer: Self-pay | Admitting: Emergency Medicine

## 2016-02-26 ENCOUNTER — Inpatient Hospital Stay
Admission: EM | Admit: 2016-02-26 | Discharge: 2016-02-27 | DRG: 280 | Disposition: A | Discharge status: Other Acute Inpatient Hospital | Payer: BLUE CROSS/BLUE SHIELD | Attending: Internal Medicine | Admitting: Internal Medicine

## 2016-02-26 ENCOUNTER — Encounter
Admission: EM | Disposition: A | Discharge status: Other Acute Inpatient Hospital | Payer: Self-pay | Source: Home / Self Care | Attending: Internal Medicine

## 2016-02-26 DIAGNOSIS — Z833 Family history of diabetes mellitus: Secondary | ICD-10-CM

## 2016-02-26 DIAGNOSIS — Z87891 Personal history of nicotine dependence: Secondary | ICD-10-CM

## 2016-02-26 DIAGNOSIS — E669 Obesity, unspecified: Secondary | ICD-10-CM | POA: Diagnosis present

## 2016-02-26 DIAGNOSIS — I214 Non-ST elevation (NSTEMI) myocardial infarction: Secondary | ICD-10-CM | POA: Diagnosis present

## 2016-02-26 DIAGNOSIS — D631 Anemia in chronic kidney disease: Secondary | ICD-10-CM | POA: Diagnosis present

## 2016-02-26 DIAGNOSIS — Z992 Dependence on renal dialysis: Secondary | ICD-10-CM | POA: Diagnosis not present

## 2016-02-26 DIAGNOSIS — N186 End stage renal disease: Secondary | ICD-10-CM | POA: Diagnosis present

## 2016-02-26 DIAGNOSIS — R079 Chest pain, unspecified: Secondary | ICD-10-CM

## 2016-02-26 DIAGNOSIS — R778 Other specified abnormalities of plasma proteins: Secondary | ICD-10-CM

## 2016-02-26 DIAGNOSIS — Z794 Long term (current) use of insulin: Secondary | ICD-10-CM | POA: Diagnosis not present

## 2016-02-26 DIAGNOSIS — I132 Hypertensive heart and chronic kidney disease with heart failure and with stage 5 chronic kidney disease, or end stage renal disease: Secondary | ICD-10-CM | POA: Diagnosis present

## 2016-02-26 DIAGNOSIS — N2581 Secondary hyperparathyroidism of renal origin: Secondary | ICD-10-CM | POA: Diagnosis present

## 2016-02-26 DIAGNOSIS — T8612 Kidney transplant failure: Secondary | ICD-10-CM | POA: Diagnosis present

## 2016-02-26 DIAGNOSIS — G40909 Epilepsy, unspecified, not intractable, without status epilepticus: Secondary | ICD-10-CM | POA: Diagnosis present

## 2016-02-26 DIAGNOSIS — Y830 Surgical operation with transplant of whole organ as the cause of abnormal reaction of the patient, or of later complication, without mention of misadventure at the time of the procedure: Secondary | ICD-10-CM | POA: Diagnosis present

## 2016-02-26 DIAGNOSIS — Z955 Presence of coronary angioplasty implant and graft: Secondary | ICD-10-CM

## 2016-02-26 DIAGNOSIS — Z7982 Long term (current) use of aspirin: Secondary | ICD-10-CM

## 2016-02-26 DIAGNOSIS — Z91041 Radiographic dye allergy status: Secondary | ICD-10-CM | POA: Diagnosis not present

## 2016-02-26 DIAGNOSIS — Z7902 Long term (current) use of antithrombotics/antiplatelets: Secondary | ICD-10-CM | POA: Diagnosis not present

## 2016-02-26 DIAGNOSIS — J9600 Acute respiratory failure, unspecified whether with hypoxia or hypercapnia: Secondary | ICD-10-CM | POA: Diagnosis present

## 2016-02-26 DIAGNOSIS — Z66 Do not resuscitate: Secondary | ICD-10-CM | POA: Diagnosis present

## 2016-02-26 DIAGNOSIS — R7989 Other specified abnormal findings of blood chemistry: Secondary | ICD-10-CM

## 2016-02-26 DIAGNOSIS — E1022 Type 1 diabetes mellitus with diabetic chronic kidney disease: Secondary | ICD-10-CM | POA: Diagnosis present

## 2016-02-26 DIAGNOSIS — M109 Gout, unspecified: Secondary | ICD-10-CM | POA: Diagnosis present

## 2016-02-26 DIAGNOSIS — I25119 Atherosclerotic heart disease of native coronary artery with unspecified angina pectoris: Secondary | ICD-10-CM | POA: Diagnosis present

## 2016-02-26 DIAGNOSIS — Z8249 Family history of ischemic heart disease and other diseases of the circulatory system: Secondary | ICD-10-CM | POA: Diagnosis not present

## 2016-02-26 DIAGNOSIS — F329 Major depressive disorder, single episode, unspecified: Secondary | ICD-10-CM | POA: Diagnosis present

## 2016-02-26 HISTORY — PX: CARDIAC CATHETERIZATION: SHX172

## 2016-02-26 LAB — TROPONIN I
TROPONIN I: 0.51 ng/mL — AB (ref <0.03)
Troponin I: 4.12 ng/mL (ref <0.03)
Troponin I: 6.19 ng/mL (ref <0.03)
Troponin I: 8.34 ng/mL (ref <0.03)

## 2016-02-26 LAB — CBC
HCT: 42 % (ref 40.0–52.0)
HEMOGLOBIN: 13.4 g/dL (ref 13.0–18.0)
MCH: 24.5 pg — AB (ref 26.0–34.0)
MCHC: 31.9 g/dL — ABNORMAL LOW (ref 32.0–36.0)
MCV: 76.8 fL — AB (ref 80.0–100.0)
Platelets: 245 10*3/uL (ref 150–440)
RBC: 5.47 MIL/uL (ref 4.40–5.90)
RDW: 16.3 % — ABNORMAL HIGH (ref 11.5–14.5)
WBC: 11.3 10*3/uL — ABNORMAL HIGH (ref 3.8–10.6)

## 2016-02-26 LAB — BASIC METABOLIC PANEL
ANION GAP: 17 — AB (ref 5–15)
BUN: 57 mg/dL — ABNORMAL HIGH (ref 6–20)
CHLORIDE: 90 mmol/L — AB (ref 101–111)
CO2: 30 mmol/L (ref 22–32)
Calcium: 8.2 mg/dL — ABNORMAL LOW (ref 8.9–10.3)
Creatinine, Ser: 11.05 mg/dL — ABNORMAL HIGH (ref 0.61–1.24)
GFR calc non Af Amer: 4 mL/min — ABNORMAL LOW (ref >60)
GFR, EST AFRICAN AMERICAN: 5 mL/min — AB (ref >60)
Glucose, Bld: 270 mg/dL — ABNORMAL HIGH (ref 65–99)
Potassium: 4.3 mmol/L (ref 3.5–5.1)
Sodium: 137 mmol/L (ref 135–145)

## 2016-02-26 LAB — GLUCOSE, CAPILLARY: Glucose-Capillary: 156 mg/dL — ABNORMAL HIGH (ref 65–99)

## 2016-02-26 LAB — HEMOGLOBIN A1C: Hgb A1c MFr Bld: 6.4 % — ABNORMAL HIGH (ref 4.0–6.0)

## 2016-02-26 LAB — MRSA PCR SCREENING: MRSA by PCR: NEGATIVE

## 2016-02-26 LAB — TSH: TSH: 0.912 u[IU]/mL (ref 0.350–4.500)

## 2016-02-26 SURGERY — LEFT HEART CATH AND CORONARY ANGIOGRAPHY
Anesthesia: Moderate Sedation

## 2016-02-26 MED ORDER — INSULIN ASPART 100 UNIT/ML ~~LOC~~ SOLN: 0.0000 [IU] | Freq: Every day | SUBCUTANEOUS | Status: DC

## 2016-02-26 MED ORDER — IOPAMIDOL (ISOVUE-300) INJECTION 61%
INTRAVENOUS | Status: DC | PRN
  Administered 2016-02-26: 100 mL via INTRA_ARTERIAL

## 2016-02-26 MED ORDER — MIDAZOLAM HCL 2 MG/2ML IJ SOLN
INTRAMUSCULAR | Status: AC
  Filled 2016-02-26: qty 2

## 2016-02-26 MED ORDER — METHYLPREDNISOLONE SODIUM SUCC 125 MG IJ SOLR
INTRAMUSCULAR | Status: AC
  Filled 2016-02-26: qty 2

## 2016-02-26 MED ORDER — DOCUSATE SODIUM 100 MG PO CAPS
100.0000 mg | ORAL_CAPSULE | Freq: Two times a day (BID) | ORAL | Status: DC
  Administered 2016-02-26: 100 mg via ORAL
  Filled 2016-02-26: qty 1

## 2016-02-26 MED ORDER — CARVEDILOL 3.125 MG PO TABS
3.1250 mg | ORAL_TABLET | Freq: Two times a day (BID) | ORAL | Status: DC
  Administered 2016-02-26: 3.125 mg via ORAL
  Filled 2016-02-26: qty 1

## 2016-02-26 MED ORDER — SODIUM CHLORIDE 0.9 % IV SOLN
250.0000 mL | INTRAVENOUS | Status: DC | PRN
  Administered 2016-02-26: 1000 mL via INTRAVENOUS

## 2016-02-26 MED ORDER — INSULIN ASPART 100 UNIT/ML ~~LOC~~ SOLN: 0.0000 [IU] | Freq: Three times a day (TID) | SUBCUTANEOUS | Status: DC

## 2016-02-26 MED ORDER — RENA-VITE PO TABS
1.0000 | ORAL_TABLET | Freq: Every day | ORAL | Status: DC
  Administered 2016-02-26: 1 via ORAL
  Filled 2016-02-26: qty 1

## 2016-02-26 MED ORDER — MELATONIN 5 MG PO TABS
2.5000 mg | ORAL_TABLET | Freq: Every day | ORAL | Status: DC
  Filled 2016-02-26 (×2): qty 0.5

## 2016-02-26 MED ORDER — NITROGLYCERIN 0.4 MG SL SUBL
0.4000 mg | SUBLINGUAL_TABLET | SUBLINGUAL | Status: DC | PRN
  Administered 2016-02-27: 0.4 mg via SUBLINGUAL
  Filled 2016-02-26: qty 1

## 2016-02-26 MED ORDER — ACETAMINOPHEN 650 MG RE SUPP
650.0000 mg | Freq: Four times a day (QID) | RECTAL | Status: DC | PRN
Start: 2016-02-26 — End: 2016-02-27

## 2016-02-26 MED ORDER — SODIUM CHLORIDE 0.9% FLUSH: 3.0000 mL | Freq: Two times a day (BID) | INTRAVENOUS | Status: DC

## 2016-02-26 MED ORDER — MIDAZOLAM HCL 2 MG/2ML IJ SOLN
INTRAMUSCULAR | Status: DC | PRN
  Administered 2016-02-26: 1 mg via INTRAVENOUS

## 2016-02-26 MED ORDER — ALLOPURINOL 100 MG PO TABS
100.0000 mg | ORAL_TABLET | Freq: Every day | ORAL | Status: DC
  Filled 2016-02-26: qty 1

## 2016-02-26 MED ORDER — SODIUM CHLORIDE 0.9% FLUSH: 3.0000 mL | INTRAVENOUS | Status: DC | PRN

## 2016-02-26 MED ORDER — LOSARTAN POTASSIUM 50 MG PO TABS
100.0000 mg | ORAL_TABLET | Freq: Every day | ORAL | Status: DC
  Administered 2016-02-26: 100 mg via ORAL
  Filled 2016-02-26: qty 2

## 2016-02-26 MED ORDER — OXYCODONE HCL 5 MG PO TABS
5.0000 mg | ORAL_TABLET | ORAL | Status: DC | PRN
  Administered 2016-02-27: 5 mg via ORAL
  Filled 2016-02-26: qty 1

## 2016-02-26 MED ORDER — MIRTAZAPINE 15 MG PO TABS
7.5000 mg | ORAL_TABLET | Freq: Every day | ORAL | Status: DC
  Administered 2016-02-26: 7.5 mg via ORAL
  Filled 2016-02-26: qty 1

## 2016-02-26 MED ORDER — ASPIRIN EC 81 MG PO TBEC
81.0000 mg | DELAYED_RELEASE_TABLET | Freq: Every day | ORAL | Status: DC
  Administered 2016-02-26: 81 mg via ORAL
  Filled 2016-02-26: qty 1

## 2016-02-26 MED ORDER — HEPARIN (PORCINE) IN NACL 100-0.45 UNIT/ML-% IJ SOLN
1000.0000 [IU]/h | INTRAMUSCULAR | Status: DC
  Administered 2016-02-26: 1000 [IU]/h via INTRAVENOUS
  Filled 2016-02-26: qty 250

## 2016-02-26 MED ORDER — PANTOPRAZOLE SODIUM 40 MG PO TBEC: 40.0000 mg | DELAYED_RELEASE_TABLET | Freq: Every day | ORAL | Status: DC

## 2016-02-26 MED ORDER — FAMOTIDINE 20 MG PO TABS
ORAL_TABLET | ORAL | Status: AC
  Filled 2016-02-26: qty 1

## 2016-02-26 MED ORDER — CLOPIDOGREL BISULFATE 75 MG PO TABS
75.0000 mg | ORAL_TABLET | Freq: Every day | ORAL | Status: DC
  Administered 2016-02-26: 75 mg via ORAL
  Filled 2016-02-26: qty 1

## 2016-02-26 MED ORDER — MELATONIN 3 MG PO TABS: 3.0000 mg | ORAL_TABLET | Freq: Every day | ORAL | Status: DC

## 2016-02-26 MED ORDER — ATORVASTATIN CALCIUM 20 MG PO TABS: 40.0000 mg | ORAL_TABLET | Freq: Every day | ORAL | Status: DC

## 2016-02-26 MED ORDER — ASPIRIN 81 MG PO CHEW: 81.0000 mg | CHEWABLE_TABLET | ORAL | Status: DC

## 2016-02-26 MED ORDER — ACETAMINOPHEN 325 MG PO TABS
650.0000 mg | ORAL_TABLET | Freq: Four times a day (QID) | ORAL | Status: DC | PRN
Start: 2016-02-26 — End: 2016-02-27

## 2016-02-26 MED ORDER — ONDANSETRON HCL 4 MG/2ML IJ SOLN: 4.0000 mg | Freq: Four times a day (QID) | INTRAMUSCULAR | Status: DC | PRN

## 2016-02-26 MED ORDER — ONDANSETRON HCL 4 MG PO TABS: 4.0000 mg | ORAL_TABLET | Freq: Four times a day (QID) | ORAL | Status: DC | PRN

## 2016-02-26 MED ORDER — HEPARIN (PORCINE) IN NACL 2-0.9 UNIT/ML-% IJ SOLN
INTRAMUSCULAR | Status: AC
  Filled 2016-02-26: qty 500

## 2016-02-26 MED ORDER — SODIUM CHLORIDE 0.9% FLUSH
3.0000 mL | Freq: Two times a day (BID) | INTRAVENOUS | Status: DC
  Administered 2016-02-26 (×2): 3 mL via INTRAVENOUS

## 2016-02-26 MED ORDER — SODIUM CHLORIDE 0.9 % IV SOLN: INTRAVENOUS | Status: DC

## 2016-02-26 MED ORDER — FENTANYL CITRATE (PF) 100 MCG/2ML IJ SOLN
INTRAMUSCULAR | Status: AC
  Filled 2016-02-26: qty 2

## 2016-02-26 MED ORDER — LEVETIRACETAM 500 MG PO TABS
500.0000 mg | ORAL_TABLET | Freq: Two times a day (BID) | ORAL | Status: DC
  Administered 2016-02-26 (×2): 500 mg via ORAL
  Filled 2016-02-26 (×2): qty 1

## 2016-02-26 SURGICAL SUPPLY — 9 items
CATH 5FR JR4 DIAGNOSTIC (CATHETERS) ×3 IMPLANT
CATH INFINITI 5FR ANG PIGTAIL (CATHETERS) ×3 IMPLANT
CATH INFINITI 5FR JL4 (CATHETERS) ×3 IMPLANT
DEVICE CLOSURE MYNXGRIP 5F (Vascular Products) ×3 IMPLANT
KIT MANI 3VAL PERCEP (MISCELLANEOUS) ×3 IMPLANT
NEEDLE PERC 18GX7CM (NEEDLE) ×3 IMPLANT
PACK CARDIAC CATH (CUSTOM PROCEDURE TRAY) ×3 IMPLANT
SHEATH AVANTI 5FR X 11CM (SHEATH) ×3 IMPLANT
WIRE EMERALD 3MM-J .035X150CM (WIRE) ×3 IMPLANT

## 2016-02-26 NOTE — Progress Notes (Signed)
Dr. Tressia Miners aware of troponin 4.12.  No new orders.  Patient is chest pain free at this time.

## 2016-02-26 NOTE — ED Notes (Signed)
Patient transported to X-ray 

## 2016-02-26 NOTE — CV Procedure (Signed)
Cardiac cath because of NSTEMI/[place] history of CAD PCI stent LAD ESRD HTN Cath today suggest ant apical hypo EF=45% Cors Lmain normal          LAD 0stial 99% , patent long stent prox/mid LAD          Normal Circ          Normal RCA Rec transfer to [facility] for possible PCI vs CABG.         Discussed CCU fellow Dr Charlynne Cousins         Resume hepariin IV         Diaslysis in am

## 2016-02-26 NOTE — ED Provider Notes (Signed)
[facility] Emergency Department Provider Note   ____________________________________________   First MD Initiated Contact with Patient 02/26/16 [phone]     (approximate)  I have reviewed the triage vital signs and the nursing notes.   HISTORY  Chief Complaint Chest Pain    HPI [name] is a 65 y.o. male who comes into the hospital today with chest pain. The patient reports it started around 8 or 9 PM. He was trying to go to sleep and it just started hurting. He reports that the pain was radiating to both of his shoulders. He denies any shortness of breath, sweats, dizziness or vomiting. The patient did nauseous. He reports that he's never had this pain before. He called EMS who gave him some nitroglycerin spray and aspirin and the pain has currently resolved. The patient has no pain at this time. He was concerned so he decided to come into the hospital to get checked out. The patient has a history of end-stage renal disease on dialysis and reports he had his last dialysis on Saturday.   Past Medical History:  Diagnosis Date  . Chronic diastolic congestive heart failure ([place])   . Chronic disease anemia   . ESRD (end stage renal disease) on dialysis ([place])    "[place]; [place]; [name]" (09/29/2014)  . GERD (gastroesophageal reflux disease)   . Gout   . High cholesterol   . History of blood transfusion    "related to anemia"  . History of stomach ulcers   . Hypertension   . Type II diabetes mellitus [facility])     Patient Active Problem List   Diagnosis Date Noted  . Acute respiratory failure with hypoxia ([place]) 02/14/2015  . Syncope 11/05/2014  . Contaminant given to patient   . Shortness of breath   . Viridans streptococci infection   . Polymicrobial bacterial infection   . Bacteremia   . 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 ESRD on dialysis ([place])   . 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 Acute coronary syndrome ([place]) 09/29/2014    Past Surgical History:  Procedure Laterality Date  . ARTERIOVENOUS GRAFT PLACEMENT Left ~ 1996  . CARDIAC CATHETERIZATION  09/29/2014   "[place]"  . KIDNEY TRANSPLANT Right 2004  . NEPHRECTOMY TRANSPLANTED ORGAN  2015  . PERCUTANEOUS CORONARY STENT INTERVENTION (PCI-S) N/A 09/30/2014   Procedure: PERCUTANEOUS CORONARY STENT INTERVENTION (PCI-S);  Surgeon: [name], MD;  Location: [facility] CATH LAB;  Service: Cardiovascular;  Laterality: N/A;  . PERITONEAL CATHETER INSERTION  02/2014  . PERITONEAL CATHETER REMOVAL  08/2014  . THROMBECTOMY / ARTERIOVENOUS GRAFT REVISION  2015    Prior to Admission medications   Medication Sig Start Date End Date Taking? Authorizing Provider  allopurinol (ZYLOPRIM) 100 MG tablet Take 100 mg by mouth daily. 09/23/14  Yes Historical Provider, MD  aspirin 81 MG EC tablet Take 81 mg by mouth daily. 09/23/14  Yes Historical Provider, MD  atorvastatin (LIPITOR) 40 MG tablet Take 1 tablet (40 mg total) by mouth daily at 6 PM. 11/09/14  Yes [name], MD  carvedilol (COREG) 3.125 MG tablet Take 3.125 mg by mouth 2 (two) times daily with a meal.   Yes Historical Provider, MD  clopidogrel (PLAVIX) 75 MG tablet Take 1 tablet (75 mg total) by mouth daily with breakfast. 10/02/14  Yes [name], MD  levETIRAcetam (KEPPRA) 500 MG tablet Take 500 mg by mouth every 12 (twelve)  hours.   Yes Historical Provider, MD  losartan (COZAAR) 100 MG tablet Take 1 tablet (100 mg total) by mouth daily. 11/09/14  Yes [name], MD  Melatonin 3 MG TABS Take 3 mg by mouth at bedtime.   Yes Historical Provider, MD  mirtazapine (REMERON) 7.5 MG tablet Take 7.5 mg by mouth at bedtime.   Yes Historical Provider, MD  multivitamin (RENA-VIT) TABS tablet Take 1 tablet by mouth at bedtime. 10/02/14  Yes [name], MD  nitroGLYCERIN (NITROSTAT) 0.4 MG SL tablet Place 1 tablet (0.4 mg total) under the tongue every 5 (five)  minutes x 3 doses as needed for chest pain. 10/02/14  Yes [name], MD  pantoprazole (PROTONIX) 40 MG tablet Take 40 mg by mouth daily. 01/17/14  Yes Historical Provider, MD  amLODipine (NORVASC) 5 MG tablet Take 1 tablet (5 mg total) by mouth daily. Patient not taking: Reported on 02/26/2016 02/17/15   Idelle Crouch, MD  citalopram (CELEXA) 20 MG tablet Take 1 tablet (20 mg total) by mouth daily. Patient not taking: Reported on 02/26/2016 11/09/14   [name], MD  Nutritional Supplements (FEEDING SUPPLEMENT, NEPRO CARB STEADY,) LIQD Take 237 mLs by mouth daily. Patient not taking: Reported on 02/26/2016 02/17/15   Idelle Crouch, MD  ondansetron (ZOFRAN) 4 MG tablet Take 1 tablet (4 mg total) by mouth every 6 (six) hours as needed for nausea. Patient not taking: Reported on 02/26/2016 02/17/15   Idelle Crouch, MD    Allergies Ivp dye [iodinated diagnostic agents]  Family History  Problem Relation Age of Onset  . Hypertension Mother   . Stroke Mother   . Hypertension Father   . Diabetes Mellitus II Father   . Asthma Father     Social History Social History  Substance Use Topics  . Smoking status: Former Smoker    Types: Cigarettes  . Smokeless tobacco: Never Used     Comment: "quit smoking cigarettes in the 1980's"  . Alcohol use No    Review of Systems Constitutional: No fever/chills Eyes: No visual changes. ENT: No sore throat. Cardiovascular: chest pain. Respiratory: Denies shortness of breath. Gastrointestinal: Nausea with no vomiting Genitourinary: Negative for dysuria. Musculoskeletal: Negative for back pain. Skin: Negative for rash. Neurological: Negative for headaches, focal weakness or numbness.  10-point ROS otherwise negative.  ____________________________________________   PHYSICAL EXAM:  VITAL SIGNS: ED Triage Vitals  Enc Vitals Group     BP 02/26/16 0225 93/62     Pulse Rate 02/26/16 0225 80     Resp 02/26/16 0225 20     Temp 02/26/16 0225  98 F (36.7 C)     Temp Source 02/26/16 0225 Oral     SpO2 02/26/16 0225 94 %     Weight 02/26/16 0213 181 lb 11.2 oz (82.4 kg)     Height 02/26/16 0213 4\' 9"  (1.448 m)     Head Circumference --      Peak Flow --      Pain Score 02/26/16 0211 5     Pain Loc --      Pain Edu? --      Excl. in [place]? --     Constitutional: Alert and oriented. Well appearing and in no acute distress. Eyes: Conjunctivae are normal. PERRL. EOMI. Head: Atraumatic. Nose: No congestion/rhinnorhea. Mouth/Throat: Mucous membranes are moist.  Oropharynx non-erythematous. Cardiovascular: Normal rate, regular rhythm. Grossly normal heart sounds.  Good peripheral circulation. Respiratory: Normal respiratory effort.  No retractions. Lungs CTAB. Gastrointestinal:  Soft and nontender. No distention. Positive bowel sounds Musculoskeletal: No lower extremity tenderness nor edema.   Neurologic:  Normal speech and language.  Skin:  Skin is warm, dry and intact.  Psychiatric: Mood and affect are normal.   ____________________________________________   LABS (all labs ordered are listed, but only abnormal results are displayed)  Labs Reviewed  BASIC METABOLIC PANEL - Abnormal; Notable for the following:       Result Value   Chloride 90 (*)    Glucose, Bld 270 (*)    BUN 57 (*)    Creatinine, Ser 11.05 (*)    Calcium 8.2 (*)    GFR calc non Af Amer 4 (*)    GFR calc Af Amer 5 (*)    Anion gap 17 (*)    All other components within normal limits  CBC - Abnormal; Notable for the following:    WBC 11.3 (*)    MCV 76.8 (*)    MCH 24.5 (*)    MCHC 31.9 (*)    RDW 16.3 (*)    All other components within normal limits  TROPONIN I - Abnormal; Notable for the following:    Troponin I 0.51 (*)    All other components within normal limits   ____________________________________________  EKG  ED ECG REPORT I, Loney Hering, the attending physician, personally viewed and interpreted this ECG.   Date:  02/26/2016  EKG Time: 213  Rate: 79  Rhythm: normal sinus rhythm  Axis: normal  Intervals:none  ST&T Change: T wave in leads 1 and aVL with ST segment depression in leads II, III, aVF as well as V4, V5 and V6.  ____________________________________________  RADIOLOGY  Chest x-ray ____________________________________________   PROCEDURES  Procedure(s) performed: None  Procedures  Critical Care performed: No  ____________________________________________   INITIAL IMPRESSION / ASSESSMENT AND PLAN / ED COURSE  Pertinent labs & imaging results that were available during my care of the patient were reviewed by me and considered in my medical decision making (see chart for details).  This is a 65 year old male who comes into the hospital today with chest pain. The patient did receive some nitroglycerin and aspirin and his pain resolved. He does have some EKG changes compared to 2016 when he had his last EKG here. I well admit the patient to the hospitalist service since his troponin is elevated. I will give the patient some heparin and he will be followed by the hospitalist.  Clinical Course  Value Comment By Time  DG Chest 2 View 1. No active cardiopulmonary disease. 2. Stable cardiomegaly without pulmonary edema.   Loney Hering, MD 09/11 [phone]     ____________________________________________   FINAL CLINICAL IMPRESSION(S) / ED DIAGNOSES  Final diagnoses:  Chest pain, unspecified chest pain type  Elevated troponin      NEW MEDICATIONS STARTED DURING THIS VISIT:  New Prescriptions   No medications on file     Note:  This document was prepared using Dragon voice recognition software and may include unintentional dictation errors.    Loney Hering, MD 02/26/16 [phone]

## 2016-02-26 NOTE — H&P (Signed)
[name] is an 65 y.o. male.   Chief Complaint: Chest pain HPI: The patient with past medical history of end-stage renal disease on dialysis as well as diabetes and hypertension presents emergency department after having chest pain that began approximately 8 hours prior to admission. The patient's caregiver gave him some medicine for indigestion thinking that his substernal discomfort may be related to reflux. However the patient continued to complain of pain that radiated across both shoulders. According to the caregiver he did not appear to be short of breath. Due to continued pain and she brought him to the emergency department for evaluation where EKG showed T-wave inversions in anterior lateral leads. Troponin was elevated as well which prompted the emergency department staff to call for admission.  Past Medical History:  Diagnosis Date  . Chronic diastolic congestive heart failure ([place])   . Chronic disease anemia   . ESRD (end stage renal disease) on dialysis ([place])    "[place] [place]; [name]" (09/29/2014)  . GERD (gastroesophageal reflux disease)   . Gout   . High cholesterol   . History of blood transfusion    "related to anemia"  . History of stomach ulcers   . Hypertension   . Type II diabetes mellitus ([place])     Past Surgical History:  Procedure Laterality Date  . ARTERIOVENOUS GRAFT PLACEMENT Left ~ 1996  . CARDIAC CATHETERIZATION  09/29/2014   "[place]"  . KIDNEY TRANSPLANT Right 2004  . NEPHRECTOMY TRANSPLANTED ORGAN  2015  . PERCUTANEOUS CORONARY STENT INTERVENTION (PCI-S) N/A 09/30/2014   Procedure: PERCUTANEOUS CORONARY STENT INTERVENTION (PCI-S);  Surgeon: [name], MD;  [facility] C[facility];  Service: Cardiovascular;  Laterality: N/A;  . PERITONEAL CATHETER INSERTION  02/2014  . PERITONEAL CATHETER REMOVAL  08/2014  . THROMBECTOMY / ARTERIOVENOUS GRAFT REVISION  2015    Family History  Problem Relation Age of Onset  . Hypertension Mother   . Stroke Mother    . Hypertension Father   . Diabetes Mellitus II Father   . Asthma Father    Social History:  reports that he has quit smoking. His smoking use included Cigarettes. He has never used smokeless tobacco. He reports that he does not drink alcohol or use drugs.  Allergies:  Allergies  Allergen Reactions  . Ivp Dye [Iodinated Diagnostic Agents] Itching and Rash    Reaction: redness    Medications Prior to Admission  Medication Sig Dispense Refill  . allopurinol (ZYLOPRIM) 100 MG tablet Take 100 mg by mouth daily.  10  . aspirin 81 MG EC tablet Take 81 mg by mouth daily.  10  . atorvastatin (LIPITOR) 40 MG tablet Take 1 tablet (40 mg total) by mouth daily at 6 PM. 30 tablet 0  . carvedilol (COREG) 3.125 MG tablet Take 3.125 mg by mouth 2 (two) times daily with a meal.    . clopidogrel (PLAVIX) 75 MG tablet Take 1 tablet (75 mg total) by mouth daily with breakfast. 30 tablet 11  . levETIRAcetam (KEPPRA) 500 MG tablet Take 500 mg by mouth every 12 (twelve) hours.    Marland Kitchen losartan (COZAAR) 100 MG tablet Take 1 tablet (100 mg total) by mouth daily. 30 tablet 0  . Melatonin 3 MG TABS Take 3 mg by mouth at bedtime.    . mirtazapine (REMERON) 7.5 MG tablet Take 7.5 mg by mouth at bedtime.    . multivitamin (RENA-VIT) TABS tablet Take 1 tablet by mouth at bedtime. 30 tablet 3  . nitroGLYCERIN (NITROSTAT)  0.4 MG SL tablet Place 1 tablet (0.4 mg total) under the tongue every 5 (five) minutes x 3 doses as needed for chest pain. 25 tablet 12  . pantoprazole (PROTONIX) 40 MG tablet Take 40 mg by mouth daily.    Marland Kitchen amLODipine (NORVASC) 5 MG tablet Take 1 tablet (5 mg total) by mouth daily. (Patient not taking: Reported on 02/26/2016) 30 tablet 1  . citalopram (CELEXA) 20 MG tablet Take 1 tablet (20 mg total) by mouth daily. (Patient not taking: Reported on 02/26/2016) 30 tablet 0  . Nutritional Supplements (FEEDING SUPPLEMENT, NEPRO CARB STEADY,) LIQD Take 237 mLs by mouth daily. (Patient not taking: Reported on  02/26/2016) 30 Can 1  . ondansetron (ZOFRAN) 4 MG tablet Take 1 tablet (4 mg total) by mouth every 6 (six) hours as needed for nausea. (Patient not taking: Reported on 02/26/2016) 20 tablet 0    Results for orders placed or performed during the hospital encounter of 02/26/16 (from the past 48 hour(s))  Basic metabolic panel     Status: Abnormal   Collection Time: 02/26/16  2:15 AM  Result Value Ref Range   Sodium 137 135 - 145 mmol/L   Potassium 4.3 3.5 - 5.1 mmol/L   Chloride 90 (L) 101 - 111 mmol/L   CO2 30 22 - 32 mmol/L   Glucose, Bld 270 (H) 65 - 99 mg/dL   BUN 57 (H) 6 - 20 mg/dL   Creatinine, Ser 11.05 (H) 0.61 - 1.24 mg/dL   Calcium 8.2 (L) 8.9 - 10.3 mg/dL   GFR calc non Af Amer 4 (L) >60 mL/min   GFR calc Af Amer 5 (L) >60 mL/min    Comment: (NOTE) The eGFR has been calculated using the CKD EPI equation. This calculation has not been validated in all clinical situations. eGFR's persistently <60 mL/min signify possible Chronic Kidney Disease.    Anion gap 17 (H) 5 - 15  CBC     Status: Abnormal   Collection Time: 02/26/16  2:15 AM  Result Value Ref Range   WBC 11.3 (H) 3.8 - 10.6 K/uL   RBC 5.47 4.40 - 5.90 MIL/uL   Hemoglobin 13.4 13.0 - 18.0 g/dL   HCT 42.0 40.0 - 52.0 %   MCV 76.8 (L) 80.0 - 100.0 fL   MCH 24.5 (L) 26.0 - 34.0 pg   MCHC 31.9 (L) 32.0 - 36.0 g/dL   RDW 16.3 (H) 11.5 - 14.5 %   Platelets 245 150 - 440 K/uL  Troponin I     Status: Abnormal   Collection Time: 02/26/16  2:15 AM  Result Value Ref Range   Troponin I 0.51 (HH) <0.03 ng/mL    Comment: CRITICAL RESULT CALLED TO, READ BACK BY AND VERIFIED WITH [name] AT 2458 02/26/16.PMH   Dg Chest 2 View  Result Date: 02/26/2016 CLINICAL DATA:  Initial evaluation for acute right upper sided chest tightness. EXAM: CHEST  2 VIEW COMPARISON:  Prior radiograph from 02/14/2015. FINDINGS: Mild cardiomegaly stable. Coronary stent noted. Mediastinal silhouette within normal limits. Lungs normally  inflated. No overt pulmonary edema. No focal infiltrates. No pleural effusion. No pneumothorax. Vascular stent overlies the left upper extremity/axilla. Retained metallic density within the right upper back noted, stable. No acute osseous abnormality. IMPRESSION: 1. No active cardiopulmonary disease. 2. Stable cardiomegaly without pulmonary edema. Electronically Signed   By: Jeannine Boga M.D.   On: 02/26/2016 03:40    Review of Systems  Unable to perform ROS: Acuity of condition  Blood pressure 114/62, pulse 66, temperature 97.6 F (36.4 C), temperature source Oral, resp. rate 18, height 4' 9" (1.448 m), weight 82.4 kg (181 lb 11.2 oz), SpO2 99 %. Physical Exam  Constitutional: He is oriented to person, place, and time. He appears well-developed and well-nourished. No distress.  HENT:  Head: Normocephalic and atraumatic.  Mouth/Throat: Oropharynx is clear and moist. No oropharyngeal exudate.  Eyes: Conjunctivae and EOM are normal. Pupils are equal, round, and reactive to light. No scleral icterus.  Neck: Normal range of motion. Neck supple. No JVD present. No tracheal deviation present. No thyromegaly present.  Cardiovascular: Normal rate, regular rhythm and normal heart sounds.   Respiratory: Effort normal and breath sounds normal.  GI: Soft. Bowel sounds are normal. He exhibits no distension. There is no tenderness.  Genitourinary:  Genitourinary Comments: Deferred  Musculoskeletal: Normal range of motion. He exhibits no edema.  Lymphadenopathy:    He has no cervical adenopathy.  Neurological: He is alert and oriented to person, place, and time. No cranial nerve deficit.  Skin: Skin is warm and dry. No rash noted. No erythema.  Psychiatric: He has a normal mood and affect. His behavior is normal. Judgment and thought content normal.     Assessment/Plan This is a 65 year old male admitted for NSTEMI. 1. NSTEMI: The patient is received aspirin in the emergency department and  he has been started on a heparin drip. Nothing by mouth for heart catheterization. Monitor telemetry and follow cardiac enzymes. 2. End-stage renal disease: The patient usually receives dialysis on Tuesday Thursday Saturday. I have consulted nephrology for continuation of dialysis. 3. CAD: Continue aspirin and Plavix 4. Hypertension: Controlled; continue carvedilol and valsartan 5. Seizure disorder: Continue Keppra 6. Hyperlipidemia: Continue statin therapy 7. Gout: Continue allopurinol per renal dosing 8. DVT prophylaxis: Therapeutic anticoagulation as above 9. GI prophylaxis: Pantoprazole per home regimen The patient is a full code. (He has been a DO NOT RESUSCITATE on his previous 2 admissions). Encourage family discussion regarding advanced directives. Time spent on admission orders and patient care approximately 45 minutes  Harrie Foreman, MD 02/26/2016, 5:51 AM

## 2016-02-26 NOTE — Plan of Care (Signed)
Problem: Phase I Progression Outcomes Goal: Anginal pain relieved Outcome: Progressing Per pt, he is not experiencing any more chest pain.

## 2016-02-26 NOTE — Care Management (Signed)
Patient admitted from home with nstemi.  Troponin 0.51 and ekg changes.  Patient is followed by [facility] on [address] and his days are T T Sat.  This week he was to received dialysis today because the clinic will be closed 9.12 due to the "hurricaine" per [employer] office.  Notified Dr Juleen China that patient had been schedule for dialysis today, then resume usual schedule on Thursday.  Faxed H/P to [facility]

## 2016-02-26 NOTE — Care Management (Signed)
Troponin up to 4.12.  Anticipate cardiac cath hopefully today if the already full schedule will allow.

## 2016-02-26 NOTE — Progress Notes (Signed)
Consents for dialysis and cardiac catheterization are in the chart.

## 2016-02-26 NOTE — ED Triage Notes (Signed)
Pt comes into the ED via EMS from home c/o chest tightness that has been going on for 6 hours.  Patient given 324 asp and I spray nitro in route.  Patient pain decreased from 7/10 to 5/10.  Patient in NAD at this time with even and unlabored respirations.  Patient VS stable with unremarkable EKG

## 2016-02-26 NOTE — Progress Notes (Signed)
Pharmacy Note Pt here with possible NSTEMI, not currently on a heparin drip. Paged rounding hospitalist, per MD pt going for cath soon in a couple of hours, pt is chest pain free, will hold off on heparin drip at this time.

## 2016-02-26 NOTE — Progress Notes (Signed)
Dr. Clayborn Bigness states patient will transfer to [facility].  Attending physician will be Hermelinda Medicus.  Fellow will be Charlynne Cousins.    Start heparin gtt at [place] starting at [place].

## 2016-02-26 NOTE — Progress Notes (Signed)
Inpatient Diabetes Program Recommendations  AACE/ADA: New Consensus Statement on Inpatient Glycemic Control (2015)  Target Ranges:  Prepandial:   less than 140 mg/dL      Peak postprandial:   less than 180 mg/dL (1-2 hours)      Critically ill patients:  140 - 180 mg/dL   Results for [name], [name] (MRN [id number]) as of 02/26/2016 08:59  Ref. Range 02/26/2016 02:15  Glucose Latest Ref Range: 65 - 99 mg/dL 270 (H)    Review of Glycemic Control  Diabetes history: DM2 Outpatient Diabetes medications: None Current orders for Inpatient glycemic control: None  Inpatient Diabetes Program Recommendations: Correction (SSI): Please consider sensitive correction Novolog 0-9 units Q4H when NPO. HgbA1C: In process   Thank you,  Windy Carina, RN, BSN Diabetes Coordinator Inpatient Diabetes Program [phone] (Team Pager) [phone] ([facility] office) [phone] [facility] office) [phone] [facility] office)

## 2016-02-26 NOTE — ED Notes (Signed)
Patient returned from radiology

## 2016-02-26 NOTE — OR Nursing (Signed)
Discussed with Dr Clayborn Bigness pt denial of contrast allergy, (possible allergy listed due to prior pre-dialysis status, but pt now on dialysis) no pre med ordered.

## 2016-02-26 NOTE — Progress Notes (Signed)
[facility]  ROUNDING NOTE   Subjective:   Wife at bedside. Interpretting for patient.  Admitted last night for chest pain. Right sided. Plan for cardiac catheterization Dialysis for later today due to change in schedule.   Objective:  Vital signs in last 24 hours:  Temp:  [97.6 F (36.4 C)-98 F (36.7 C)] 97.6 F (36.4 C) (09/11 0544) Pulse Rate:  [62-81] 66 (09/11 0544) Resp:  [16-24] 18 (09/11 0544) BP: (93-118)/(62-69) 114/62 (09/11 0544) SpO2:  [90 %-100 %] 99 % (09/11 0544) Weight:  [82.4 kg (181 lb 11.2 oz)] 82.4 kg (181 lb 11.2 oz) (09/11 0213)  Weight change:  Filed Weights   02/26/16 0213  Weight: 82.4 kg (181 lb 11.2 oz)    Intake/Output: No intake/output data recorded.   Intake/Output this shift:  No intake/output data recorded.  Physical Exam: General: NAD, laying in bed  Head: Normocephalic, atraumatic. Moist oral mucosal membranes  Eyes: Anicteric, PERRL  Neck: Supple, trachea midline  Lungs:  Clear to auscultation  Heart: Regular rate and rhythm  Abdomen:  Soft, nontender, obese  Extremities: no peripheral edema.  Neurologic: Nonfocal, moving all four extremities  Skin: No lesions  Access: Right arm AVG    Basic Metabolic Panel:  Recent Labs Lab 02/26/16 0215  NA 137  K 4.3  CL 90*  CO2 30  GLUCOSE 270*  BUN 57*  CREATININE 11.05*  CALCIUM 8.2*    Liver Function Tests: No results for input(s): AST, ALT, ALKPHOS, BILITOT, PROT, ALBUMIN in the last 168 hours. No results for input(s): LIPASE, AMYLASE in the last 168 hours. No results for input(s): AMMONIA in the last 168 hours.  CBC:  Recent Labs Lab 02/26/16 0215  WBC 11.3*  HGB 13.4  HCT 42.0  MCV 76.8*  PLT 245    Cardiac Enzymes:  Recent Labs Lab 02/26/16 0215 02/26/16 0808  TROPONINI 0.51* 4.12*    BNP: Invalid input(s): POCBNP  CBG: No results for input(s): GLUCAP in the last 168 hours.  Microbiology: Results for orders placed or performed  during the hospital encounter of 02/26/16  MRSA PCR Screening     Status: None   Collection Time: 02/26/16  6:52 AM  Result Value Ref Range Status   MRSA by PCR NEGATIVE NEGATIVE Final    Comment:        The GeneXpert MRSA Assay (FDA approved for NASAL specimens only), is one component of a comprehensive MRSA colonization surveillance program. It is not intended to diagnose MRSA infection nor to guide or monitor treatment for MRSA infections.     Coagulation Studies: No results for input(s): LABPROT, INR in the last 72 hours.  Urinalysis: No results for input(s): COLORURINE, LABSPEC, PHURINE, GLUCOSEU, HGBUR, BILIRUBINUR, KETONESUR, PROTEINUR, UROBILINOGEN, NITRITE, LEUKOCYTESUR in the last 72 hours.  Invalid input(s): APPERANCEUR    Imaging: Dg Chest 2 View  Result Date: 02/26/2016 CLINICAL DATA:  Initial evaluation for acute right upper sided chest tightness. EXAM: CHEST  2 VIEW COMPARISON:  Prior radiograph from 02/14/2015. FINDINGS: Mild cardiomegaly stable. Coronary stent noted. Mediastinal silhouette within normal limits. Lungs normally inflated. No overt pulmonary edema. No focal infiltrates. No pleural effusion. No pneumothorax. Vascular stent overlies the left upper extremity/axilla. Retained metallic density within the right upper back noted, stable. No acute osseous abnormality. IMPRESSION: 1. No active cardiopulmonary disease. 2. Stable cardiomegaly without pulmonary edema. Electronically Signed   By: Jeannine Boga M.D.   On: 02/26/2016 03:40     Medications:     .  allopurinol  100 mg Oral Daily  . aspirin EC  81 mg Oral Daily  . atorvastatin  40 mg Oral q1800  . carvedilol  3.125 mg Oral BID WC  . clopidogrel  75 mg Oral Q breakfast  . docusate sodium  100 mg Oral BID  . levETIRAcetam  500 mg Oral Q12H  . losartan  100 mg Oral Daily  . Melatonin  2.5 mg Oral QHS  . mirtazapine  7.5 mg Oral QHS  . multivitamin  1 tablet Oral QHS  . pantoprazole  40  mg Oral Daily  . sodium chloride flush  3 mL Intravenous Q12H   acetaminophen **OR** acetaminophen, nitroGLYCERIN, ondansetron **OR** ondansetron (ZOFRAN) IV, oxyCODONE  Assessment/ Plan:  [name] is a 65 y.o. Asian ([place]) male with ESRD status post renal transplant status post failure and subsequent nephrectomy, hypertension, coronary artery disease, hyperlipidemia, gout, diabetes mellitus type II  [facility] T[facility] [address]   1. End Stage Renal Disease: hemodialysis for later today. Then resume TTS schedule. No UF due to active ischemia. Potassium at goal.  Will scheduled dialysis around cardiac procedure.   2. Hypertension: blood pressure at goal.  - carvedilol, losartan  3. Anemia of chronic kidney disease: hemoglobin at goal. Hold epo due to ischemia  4. Secondary Hyperparathyroidism: PTH 457 outpatient. Calcium and phosphorus at goal.  - not currently on binders  5. Diabetes mellitus type II: currently diet controlled. Hemoglobin A1c 6.6% as outpateint.     LOS: 0 Florice Hindle 9/11/201710:36 AM

## 2016-02-26 NOTE — Progress Notes (Signed)
[place] [facility] NAME: [name]    MR#:  [id number]  DATE OF BIRTH:  1951/02/21  SUBJECTIVE:  CHIEF COMPLAINT:   Chief Complaint  Patient presents with  . Chest Pain   - Very poor historian. Denies any chest pain, resting comfortably. Troponins are elevated. -Scheduled for cardiac catheterization this afternoon.  REVIEW OF SYSTEMS:  Review of Systems  Constitutional: Negative for chills and fever.  HENT: Negative for ear discharge and ear pain.   Respiratory: Negative for cough, shortness of breath and wheezing.   Cardiovascular: Negative for chest pain, palpitations and leg swelling.  Gastrointestinal: Negative for abdominal pain, constipation, diarrhea, nausea and vomiting.  Genitourinary: Negative for dysuria.  Neurological: Positive for weakness. Negative for dizziness, speech change, focal weakness, seizures and headaches.  Psychiatric/Behavioral: Negative for depression.    DRUG ALLERGIES:   Allergies  Allergen Reactions  . Ivp Dye [Iodinated Diagnostic Agents] Other (See Comments)    Pt denied    VITALS:  Blood pressure 94/74, pulse 66, temperature 97.7 F (36.5 C), temperature source Oral, resp. rate 17, height 4\' 9"  (1.448 m), weight 82.4 kg (181 lb 11.2 oz), SpO2 95 %.  PHYSICAL EXAMINATION:  Physical Exam  GENERAL:  65 y.o.-year-old patient lying in the bed with no acute distress.  EYES: Pupils equal, round, reactive to light and accommodation. No scleral icterus. Extraocular muscles intact.  HEENT: Head atraumatic, normocephalic. Oropharynx and nasopharynx clear.  NECK:  Supple, no jugular venous distention. No thyroid enlargement, no tenderness.  LUNGS: Normal breath sounds bilaterally, no wheezing, rales,rhonchi or crepitation. No use of accessory muscles of respiration.  CARDIOVASCULAR: S1, S2 normal. No  rubs, or gallops. 3/6 systolic murmur is present ABDOMEN: Soft, nontender, nondistended. Bowel  sounds present. No organomegaly or mass.  EXTREMITIES: No pedal edema, cyanosis, or clubbing. Left forearm AV fistula present NEUROLOGIC: Cranial nerves II through XII are intact. Muscle strength 5/5 in all extremities. Sensation intact. Gait not checked. global weakness noted PSYCHIATRIC: The patient is alert and oriented x 2-3.  SKIN: No obvious rash, lesion, or ulcer.    LABORATORY PANEL:   CBC  Recent Labs Lab 02/26/16 0215  WBC 11.3*  HGB 13.4  HCT 42.0  PLT 245   ------------------------------------------------------------------------------------------------------------------  Chemistries   Recent Labs Lab 02/26/16 0215  NA 137  K 4.3  CL 90*  CO2 30  GLUCOSE 270*  BUN 57*  CREATININE 11.05*  CALCIUM 8.2*   ------------------------------------------------------------------------------------------------------------------  Cardiac Enzymes  Recent Labs Lab 02/26/16 1329  TROPONINI 6.19*   ------------------------------------------------------------------------------------------------------------------  RADIOLOGY:  Dg Chest 2 View  Result Date: 02/26/2016 CLINICAL DATA:  Initial evaluation for acute right upper sided chest tightness. EXAM: CHEST  2 VIEW COMPARISON:  Prior radiograph from 02/14/2015. FINDINGS: Mild cardiomegaly stable. Coronary stent noted. Mediastinal silhouette within normal limits. Lungs normally inflated. No overt pulmonary edema. No focal infiltrates. No pleural effusion. No pneumothorax. Vascular stent overlies the left upper extremity/axilla. Retained metallic density within the right upper back noted, stable. No acute osseous abnormality. IMPRESSION: 1. No active cardiopulmonary disease. 2. Stable cardiomegaly without pulmonary edema. Electronically Signed   By: Jeannine Boga M.D.   On: 02/26/2016 03:40    EKG:   Orders placed or performed during the hospital encounter of 02/26/16  . ED EKG within 10 minutes  . ED EKG within 10  minutes  . EKG 12-Lead  . EKG 12-Lead    ASSESSMENT AND PLAN:   65 year old  male with multiple medical problems including end-stage renal disease on Tuesday Thursday Saturday hemodialysis, anemia, diastolic CHF, CAD status post prior stents, hypertension, GERD and diabetes mellitus presents to the hospital secondary to chest pain and noted to have elevated troponin.  #1 NSTEMI- troponins have been elevated since morning. Seen by cardiology and scheduled for cardiac catheterization, so hasn't started on IV heparin -Patient already on aspirin, Coreg, Plavix and statin -Denies any chest pain and stable at this time. -Had an abnormal stress echo as outpatient about 2 months ago, patient was stable at the time and cardiac catheterization was deferred at the time.  #2 end-stage renal disease on hemodialysis-nephrology has been consulted. -Due for dialysis tomorrow -Failed renal transplant. His in the process of evaluation for another transplant  #3 hypertension-continue home medications. Patient on losartan, Coreg  #4 depression-continue Remeron  #5. Seizure Disorder-on Keppra  #6 DVT prophylaxis-can start subcutaneous heparin after the cardiac catheterization.    All the records are reviewed and case discussed with Care Management/Social Workerr. Management plans discussed with the patient, family and they are in agreement.  CODE STATUS: Full Code  TOTAL TIME TAKING CARE OF THIS PATIENT: 39 minutes.   POSSIBLE D/C IN 1-2 DAYS, DEPENDING ON CLINICAL CONDITION.   [name] M.D on 02/26/2016 at 6:00 PM  Between 7am to 6pm - Pager - [phone]  After 6pm go to [email or web address] - password [email or web address]  Office  [phone]  CC: Primary care physician; [name], MD

## 2016-02-26 NOTE — Progress Notes (Signed)
Text paged to Dr. Tressia Miners:   Room 236 ([place].).  Troponin went up to 6.19.  Patient chest pain free.  Cath scheduled for 330pm.

## 2016-02-26 NOTE — Consult Note (Signed)
Reason for Consult:Non Stemi [place] Referring Physician: Dr Tressia Miners hospitalist  [name] is an 65 y.o. male.  HPI: Pt has a history of multiple medical problemms including DM, ESRD ,HTN , SOB , CAD history PCI-Stent now c/o of angina. He has had sob and [place]. Pt presented for cardiac evaluation. Pt was found to have elevated troponin so cardiology was consulted.Angina had been on going for several days. He is pain free lying in bed. Scheduled for dialysis tomorrow.  Past Medical History:  Diagnosis Date  . Chronic diastolic congestive heart failure ([place])   . Chronic disease anemia   . ESRD (end stage renal disease) on dialysis ([place])    "[facility]; [place]; [name]" (09/29/2014)  . GERD (gastroesophageal reflux disease)   . Gout   . High cholesterol   . History of blood transfusion    "related to anemia"  . History of stomach ulcers   . Hypertension   . Type II diabetes mellitus ([place])     Past Surgical History:  Procedure Laterality Date  . ARTERIOVENOUS GRAFT PLACEMENT Left ~ 1996  . CARDIAC CATHETERIZATION  09/29/2014   "[place]"  . KIDNEY TRANSPLANT Right 2004  . NEPHRECTOMY TRANSPLANTED ORGAN  2015  . PERCUTANEOUS CORONARY STENT INTERVENTION (PCI-S) N/A 09/30/2014   Procedure: PERCUTANEOUS CORONARY STENT INTERVENTION (PCI-S);  Surgeon: [name], MD;  Location: [facility] CATH LAB;  Service: Cardiovascular;  Laterality: N/A;  . PERITONEAL CATHETER INSERTION  02/2014  . PERITONEAL CATHETER REMOVAL  08/2014  . THROMBECTOMY / ARTERIOVENOUS GRAFT REVISION  2015    Family History  Problem Relation Age of Onset  . Hypertension Mother   . Stroke Mother   . Hypertension Father   . Diabetes Mellitus II Father   . Asthma Father     Social History:  reports that he has quit smoking. His smoking use included Cigarettes. He has never used smokeless tobacco. He reports that he does not drink alcohol or use drugs.  Allergies:  Allergies  Allergen Reactions  . Ivp Dye [Iodinated Diagnostic  Agents] Itching and Rash    Reaction: redness    Medications: I have reviewed the patient's current medications.  Results for orders placed or performed during the hospital encounter of 02/26/16 (from the past 48 hour(s))  Basic metabolic panel     Status: Abnormal   Collection Time: 02/26/16  2:15 AM  Result Value Ref Range   Sodium 137 135 - 145 mmol/L   Potassium 4.3 3.5 - 5.1 mmol/L   Chloride 90 (L) 101 - 111 mmol/L   CO2 30 22 - 32 mmol/L   Glucose, Bld 270 (H) 65 - 99 mg/dL   BUN 57 (H) 6 - 20 mg/dL   Creatinine, Ser 11.05 (H) 0.61 - 1.24 mg/dL   Calcium 8.2 (L) 8.9 - 10.3 mg/dL   GFR calc non Af Amer 4 (L) >60 mL/min   GFR calc Af Amer 5 (L) >60 mL/min    Comment: (NOTE) The eGFR has been calculated using the CKD EPI equation. This calculation has not been validated in all clinical situations. eGFR's persistently <60 mL/min signify possible Chronic Kidney Disease.    Anion gap 17 (H) 5 - 15  CBC     Status: Abnormal   Collection Time: 02/26/16  2:15 AM  Result Value Ref Range   WBC 11.3 (H) 3.8 - 10.6 K/uL   RBC 5.47 4.40 - 5.90 MIL/uL   Hemoglobin 13.4 13.0 - 18.0 g/dL   HCT 42.0 40.0 - 52.0 %  MCV 76.8 (L) 80.0 - 100.0 fL   MCH 24.5 (L) 26.0 - 34.0 pg   MCHC 31.9 (L) 32.0 - 36.0 g/dL   RDW 16.3 (H) 11.5 - 14.5 %   Platelets 245 150 - 440 K/uL  Troponin I     Status: Abnormal   Collection Time: 02/26/16  2:15 AM  Result Value Ref Range   Troponin I 0.51 (HH) <0.03 ng/mL    Comment: CRITICAL RESULT CALLED TO, READ BACK BY AND VERIFIED WITH [name] AT 9509 02/26/16.PMH  TSH     Status: None   Collection Time: 02/26/16  2:15 AM  Result Value Ref Range   TSH 0.912 0.350 - 4.500 uIU/mL  MRSA PCR Screening     Status: None   Collection Time: 02/26/16  6:52 AM  Result Value Ref Range   MRSA by PCR NEGATIVE NEGATIVE    Comment:        The GeneXpert MRSA Assay (FDA approved for NASAL specimens only), is one component of a comprehensive MRSA  colonization surveillance program. It is not intended to diagnose MRSA infection nor to guide or monitor treatment for MRSA infections.   Troponin I (q 6hr x 3)     Status: Abnormal   Collection Time: 02/26/16  8:08 AM  Result Value Ref Range   Troponin I 4.12 (HH) <0.03 ng/mL    Comment: CRITICAL RESULT CALLED TO, READ BACK BY AND VERIFIED WITH [name] AT 0905 02/26/16 DAS     Dg Chest 2 View  Result Date: 02/26/2016 CLINICAL DATA:  Initial evaluation for acute right upper sided chest tightness. EXAM: CHEST  2 VIEW COMPARISON:  Prior radiograph from 02/14/2015. FINDINGS: Mild cardiomegaly stable. Coronary stent noted. Mediastinal silhouette within normal limits. Lungs normally inflated. No overt pulmonary edema. No focal infiltrates. No pleural effusion. No pneumothorax. Vascular stent overlies the left upper extremity/axilla. Retained metallic density within the right upper back noted, stable. No acute osseous abnormality. IMPRESSION: 1. No active cardiopulmonary disease. 2. Stable cardiomegaly without pulmonary edema. Electronically Signed   By: Jeannine Boga M.D.   On: 02/26/2016 03:40    Review of Systems  Constitutional: Positive for diaphoresis and malaise/fatigue.  HENT: Positive for congestion.   Eyes: Negative.   Respiratory: Positive for shortness of breath.   Cardiovascular: Positive for chest pain.  Gastrointestinal: Negative.   Genitourinary: Negative.   Musculoskeletal: Negative.   Skin: Negative.   Neurological: Positive for weakness.  Endo/Heme/Allergies: Negative.   Psychiatric/Behavioral: Negative.    Blood pressure 105/67, pulse 71, temperature 97.7 F (36.5 C), temperature source Oral, resp. rate 16, height '4\' 9"'$  (1.448 m), weight 82.4 kg (181 lb 11.2 oz), SpO2 94 %. Physical Exam  Nursing note and vitals reviewed. Constitutional: He is oriented to person, place, and time. He appears well-developed and well-nourished.  HENT:  Head: Normocephalic  and atraumatic.  Eyes: Conjunctivae and EOM are normal. Pupils are equal, round, and reactive to light.  Neck: Normal range of motion. Neck supple.  Cardiovascular: Normal rate, regular rhythm and normal heart sounds.   Respiratory: Effort normal and breath sounds normal.  GI: Soft. Bowel sounds are normal.  Musculoskeletal: Normal range of motion.  Neurological: He is alert and oriented to person, place, and time. He has normal reflexes.  Skin: Skin is warm.  Psychiatric: He has a normal mood and affect.    Assessment/Plan: Non Stemi [place] Obesity ESRD-Dialysis SOB DM type 1 Acute resp Failure Seizure disorder Hx endocarditis . PLAN ROMI  MI  F/U enzymes Supplimental 02 ECHO for evaluation LVF Rec cardiac cath for Non STEMI Agree with dialysis for ESRD CAD history of PCI stent Continue HTN control with coreg/valsartan DVT prophylaxis Continue keppra for seizure disorder       Raymar Joiner D. 02/26/2016, 2:01 PM

## 2016-02-27 LAB — CBC
HEMATOCRIT: 39 % — AB (ref 40.0–52.0)
HEMOGLOBIN: 13 g/dL (ref 13.0–18.0)
MCH: 25.5 pg — AB (ref 26.0–34.0)
MCHC: 33.4 g/dL (ref 32.0–36.0)
MCV: 76.3 fL — AB (ref 80.0–100.0)
Platelets: 223 10*3/uL (ref 150–440)
RBC: 5.11 MIL/uL (ref 4.40–5.90)
RDW: 16.6 % — ABNORMAL HIGH (ref 11.5–14.5)
WBC: 10.3 10*3/uL (ref 3.8–10.6)

## 2016-02-27 LAB — BASIC METABOLIC PANEL
Anion gap: 20 — ABNORMAL HIGH (ref 5–15)
BUN: 71 mg/dL — AB (ref 6–20)
CHLORIDE: 93 mmol/L — AB (ref 101–111)
CO2: 23 mmol/L (ref 22–32)
Calcium: 7.9 mg/dL — ABNORMAL LOW (ref 8.9–10.3)
Creatinine, Ser: 13.5 mg/dL — ABNORMAL HIGH (ref 0.61–1.24)
GFR calc Af Amer: 4 mL/min — ABNORMAL LOW (ref >60)
GFR calc non Af Amer: 3 mL/min — ABNORMAL LOW (ref >60)
GLUCOSE: 117 mg/dL — AB (ref 65–99)
POTASSIUM: 5.1 mmol/L (ref 3.5–5.1)
Sodium: 136 mmol/L (ref 135–145)

## 2016-02-27 LAB — GLUCOSE, CAPILLARY: Glucose-Capillary: 135 mg/dL — ABNORMAL HIGH (ref 65–99)

## 2016-02-27 LAB — TROPONIN I: Troponin I: 6.93 ng/mL (ref <0.03)

## 2016-02-27 LAB — CARDIAC CATHETERIZATION: Cath EF Quantitative: 45 %

## 2016-02-27 LAB — HEPARIN LEVEL (UNFRACTIONATED): Heparin Unfractionated: 0.5 IU/mL (ref 0.30–0.70)

## 2016-02-27 MED ORDER — NITROGLYCERIN 2 % TD OINT: 0.5000 [in_us] | TOPICAL_OINTMENT | Freq: Four times a day (QID) | TRANSDERMAL | 0 refills | Status: DC

## 2016-02-27 MED ORDER — ACETAMINOPHEN 325 MG PO TABS: 650.0000 mg | ORAL_TABLET | Freq: Four times a day (QID) | ORAL | 0 refills | Status: DC | PRN

## 2016-02-27 MED ORDER — HEPARIN (PORCINE) IN NACL 100-0.45 UNIT/ML-% IJ SOLN: 1000.0000 [IU]/h | INTRAMUSCULAR | 0 refills | Status: DC

## 2016-02-27 MED ORDER — NITROGLYCERIN 2 % TD OINT
0.5000 [in_us] | TOPICAL_OINTMENT | Freq: Four times a day (QID) | TRANSDERMAL | Status: DC
  Administered 2016-02-27: 0.5 [in_us] via TOPICAL
  Filled 2016-02-27: qty 1

## 2016-02-27 NOTE — Discharge Summary (Signed)
[place] at [place] NAME: [name]    MR#:  [id number]  DATE OF BIRTH:  Aug 11, 1950  DATE OF ADMISSION:  02/26/2016   ADMITTING PHYSICIAN: Harrie Foreman, MD  DATE OF DISCHARGE: 02/27/16  PRIMARY CARE PHYSICIAN: Singh,Jasmine, MD   ADMISSION DIAGNOSIS:   Elevated troponin [R79.89] Chest pain, unspecified chest pain type [R07.9]  DISCHARGE DIAGNOSIS:   Active Problems:   NSTEMI (non-ST elevated myocardial infarction) ([place])   SECONDARY DIAGNOSIS:   Past Medical History:  Diagnosis Date  . Chronic diastolic congestive heart failure ([place])   . Chronic disease anemia   . ESRD (end stage renal disease) on dialysis ([place])    "[facility]; [place]; [name]" (09/29/2014)  . GERD (gastroesophageal reflux disease)   . Gout   . High cholesterol   . History of blood transfusion    "related to anemia"  . History of stomach ulcers   . Hypertension   . Type II diabetes mellitus [facility])     HOSPITAL COURSE:   65 year old male with multiple medical problems including end-stage renal disease on Tuesday Thursday Saturday hemodialysis, anemia, diastolic CHF, CAD status post prior stents, hypertension, GERD and diabetes mellitus presents to the hospital secondary to chest pain and noted to have elevated troponin.  #1 NSTEMI- active chest pain now - cardiac cath last evening showing LAD ostial 99% occlusion and will need a stent vs CABG, prior stent patent, LCX and RCA normal - will need to be transferred to [facility]- on heparin drip  -troponins have been elevated.  -Patient on aspirin, Coreg, Plavix and statin -Denies any chest pain and stable at this time. -Had an abnormal stress echo as outpatient about 2 months ago, patient was stable at the time and cardiac catheterization was deferred at that time.  #2 end-stage renal disease on hemodialysis-nephrology has been consulted. -Due for dialysis today -Failed renal transplant. His in the process  of evaluation for another transplant  #3 hypertension-continue home medications. Patient on losartan, Coreg  #4 depression-continue Remeron  #5. Seizure Disorder-on Keppra  To be [facility] to [facility] today for cardiac cathterization and stents vs CABG   DISCHARGE CONDITIONS:   Guarded  CONSULTS OBTAINED:   cardiology consultation by Dr. Clayborn Bigness Nephrology consultation by Dr. Juleen China  DRUG ALLERGIES:   Allergies  Allergen Reactions  . Ivp Dye [Iodinated Diagnostic Agents] Other (See Comments)    Pt denied   DISCHARGE MEDICATIONS:     Medication List    STOP taking these medications   amLODipine 5 MG tablet Commonly known as:  NORVASC   citalopram 20 MG tablet Commonly known as:  CELEXA   feeding supplement (NEPRO CARB STEADY) Liqd   ondansetron 4 MG tablet Commonly known as:  ZOFRAN     TAKE these medications   acetaminophen 325 MG tablet Commonly known as:  TYLENOL Take 2 tablets (650 mg total) by mouth every 6 (six) hours as needed for mild pain (or Fever >/= 101).   allopurinol 100 MG tablet Commonly known as:  ZYLOPRIM Take 100 mg by mouth daily.   aspirin 81 MG EC tablet Take 81 mg by mouth daily.   atorvastatin 40 MG tablet Commonly known as:  LIPITOR Take 1 tablet (40 mg total) by mouth daily at 6 PM.   carvedilol 3.125 MG tablet Commonly known as:  COREG Take 3.125 mg by mouth 2 (two) times daily with a meal.   clopidogrel 75 MG tablet Commonly known as:  PLAVIX Take 1 tablet (75 mg total) by mouth daily with breakfast.   heparin 100-0.45 UNIT/ML-% infusion Inject 1,000 Units/hr into the vein continuous.   levETIRAcetam 500 MG tablet Commonly known as:  KEPPRA Take 500 mg by mouth every 12 (twelve) hours.   losartan 100 MG tablet Commonly known as:  COZAAR Take 1 tablet (100 mg total) by mouth daily.   Melatonin 3 MG Tabs Take 3 mg by mouth at bedtime.   mirtazapine 7.5 MG tablet Commonly known as:  REMERON Take 7.5 mg by  mouth at bedtime.   multivitamin Tabs tablet Take 1 tablet by mouth at bedtime.   nitroGLYCERIN 0.4 MG SL tablet Commonly known as:  NITROSTAT Place 1 tablet (0.4 mg total) under the tongue every 5 (five) minutes x 3 doses as needed for chest pain. What changed:  Another medication with the same name was added. Make sure you understand how and when to take each.   nitroGLYCERIN 2 % ointment Commonly known as:  NITROGLYN Apply 0.5 inches topically every 6 (six) hours. What changed:  You were already taking a medication with the same name, and this prescription was added. Make sure you understand how and when to take each.   pantoprazole 40 MG tablet Commonly known as:  PROTONIX Take 40 mg by mouth daily.        DISCHARGE INSTRUCTIONS:   1. Transfer to [facility]U  DIET:   Cardiac diet  ACTIVITY:   Activity as tolerated  OXYGEN:   Home Oxygen: Yes.    Oxygen Delivery: 2 liters/min via Patient connected to nasal cannula oxygen  DISCHARGE LOCATION:   [facility]   If you experience worsening of your admission symptoms, develop shortness of breath, life threatening emergency, suicidal or homicidal thoughts you must seek medical attention immediately by calling 911 or calling your MD immediately  if symptoms less severe.  You Must read complete instructions/literature along with all the possible adverse reactions/side effects for all the Medicines you take and that have been prescribed to you. Take any new Medicines after you have completely understood and accpet all the possible adverse reactions/side effects.   Please note  You were cared for by a hospitalist during your hospital stay. If you have any questions about your discharge medications or the care you received while you were in the hospital after you are discharged, you can call the unit and asked to speak with the hospitalist on call if the hospitalist that took care of you is not available. Once you are discharged,  your primary care physician will handle any further medical issues. Please note that NO REFILLS for any discharge medications will be authorized once you are discharged, as it is imperative that you return to your primary care physician (or establish a relationship with a primary care physician if you do not have one) for your aftercare needs so that they can reassess your need for medications and monitor your lab values.    On the day of Discharge:  VITAL SIGNS:   Blood pressure 133/78, pulse 88, temperature 98.1 F (36.7 C), temperature source Oral, resp. rate 14, height 4\' 9"  (1.448 m), weight 74.2 kg (163 lb 9.6 oz), SpO2 96 %.  PHYSICAL EXAMINATION:    GENERAL:  65 y.o.-year-old patient lying in the bed with no acute distress.  EYES: Pupils equal, round, reactive to light and accommodation. No scleral icterus. Extraocular muscles intact.  HEENT: Head atraumatic, normocephalic. Oropharynx and nasopharynx clear.  NECK:  Supple, no jugular venous distention. No thyroid enlargement, no tenderness.  LUNGS: Normal breath sounds bilaterally, no wheezing, rales,rhonchi or crepitation. No use of accessory muscles of respiration.  CARDIOVASCULAR: S1, S2 normal. No  rubs, or gallops. 3/6 systolic murmur is present ABDOMEN: Soft, nontender, nondistended. Bowel sounds present. No organomegaly or mass.  EXTREMITIES: No pedal edema, cyanosis, or clubbing. Left forearm AV fistula present NEUROLOGIC: Cranial nerves II through XII are intact. Muscle strength 5/5 in all extremities. Sensation intact. Gait not checked. global weakness noted PSYCHIATRIC: The patient is alert and oriented x 2-3.  SKIN: No obvious rash, lesion, or ulcer.   DATA REVIEW:   CBC  Recent Labs Lab 02/27/16 0341  WBC 10.3  HGB 13.0  HCT 39.0*  PLT 223    Chemistries   Recent Labs Lab 02/27/16 0341  NA 136  K 5.1  CL 93*  CO2 23  GLUCOSE 117*  BUN 71*  CREATININE 13.50*  CALCIUM 7.9*     Microbiology  Results  Results for orders placed or performed during the hospital encounter of 02/26/16  MRSA PCR Screening     Status: None   Collection Time: 02/26/16  6:52 AM  Result Value Ref Range Status   MRSA by PCR NEGATIVE NEGATIVE Final    Comment:        The GeneXpert MRSA Assay (FDA approved for NASAL specimens only), is one component of a comprehensive MRSA colonization surveillance program. It is not intended to diagnose MRSA infection nor to guide or monitor treatment for MRSA infections.     RADIOLOGY:  No results found.   Management plans discussed with the patient, family and they are in agreement.  CODE STATUS:     Code Status Orders        Start     Ordered   02/26/16 0604  Full code  Continuous     02/26/16 0603    Code Status History    Date Active Date Inactive Code Status Order ID Comments User Context   02/26/2016  5:40 AM 02/26/2016  6:03 AM DNR [id number]  Harrie Foreman, MD Inpatient   02/14/2015  8:16 AM 02/17/2015  1:47 PM DNR [id number]  Epifanio Lesches, MD ED   12/12/2014  8:44 PM 12/16/2014  7:26 PM DNR [id number]  Loletha Grayer, MD ED   11/05/2014 10:09 PM 11/09/2014  9:48 PM Full Code [id number]  Henreitta Leber, MD Inpatient   09/30/2014  1:48 PM 10/02/2014  7:02 PM Full Code [id number]  [name], MD Inpatient   09/29/2014  8:36 PM 09/30/2014  1:48 PM Full Code [id number]  [name], MD Inpatient    Advance Directive Documentation   Flowsheet Row Most Recent Value  Type of Advance Directive  Out of facility DNR (pink MOST or yellow form)  Pre-existing out of facility DNR order (yellow form or pink MOST form)  No data  "MOST" Form in Place?  No data      TOTAL TIME TAKING CARE OF THIS PATIENT: 42 minutes.    [name] M.D on 02/27/2016 at 8:51 AM  Between 7am to 6pm - Pager - [phone]  After 6pm go to [email or web address] - Proofreader  [facility]  Office  [phone]  CC: Primary care  physician; [name], MD   Note: This dictation was prepared with Dragon dictation along with smaller phrase technology. Any transcriptional errors that result from this process are unintentional.

## 2016-02-27 NOTE — Progress Notes (Signed)
Pre Dialysis 

## 2016-02-27 NOTE — Progress Notes (Signed)
[facility]  ROUNDING NOTE   Subjective:   Seen and examined on hemodialysis. Tolerating treatment well. UF goal 1 litre  Scheduled to be transferred to [facility] for CABG.   Patient without chest pain.   Objective:  Vital signs in last 24 hours:  Temp:  [97.7 F (36.5 C)-98.7 F (37.1 C)] 98 F (36.7 C) (09/12 0939) Pulse Rate:  [63-93] 79 (09/12 1030) Resp:  [14-21] 17 (09/12 1030) BP: (89-173)/(64-98) 125/76 (09/12 1030) SpO2:  [92 %-98 %] 96 % (09/12 1030) Weight:  [72.3 kg (159 lb 8 oz)-74.2 kg (163 lb 9.6 oz)] 72.3 kg (159 lb 8 oz) (09/12 0939)  Weight change: -8.21 kg (-18 lb 1.6 oz) Filed Weights   02/26/16 0213 02/27/16 0523 02/27/16 0939  Weight: 82.4 kg (181 lb 11.2 oz) 74.2 kg (163 lb 9.6 oz) 72.3 kg (159 lb 8 oz)    Intake/Output: I/O last 3 completed shifts: In: 72.3 [I.V.:72.3] Out: 0    Intake/Output this shift:  No intake/output data recorded.  Physical Exam: General: NAD, laying in bed  Head: Normocephalic, atraumatic. Moist oral mucosal membranes  Eyes: Anicteric, PERRL  Neck: Supple, trachea midline  Lungs:  Clear to auscultation  Heart: Regular rate and rhythm  Abdomen:  Soft, nontender, obese  Extremities: no peripheral edema.  Neurologic: Nonfocal, moving all four extremities  Skin: No lesions  Access: Right arm AVG    Basic Metabolic Panel:  Recent Labs Lab 02/26/16 0215 02/27/16 0341  NA 137 136  K 4.3 5.1  CL 90* 93*  CO2 30 23  GLUCOSE 270* 117*  BUN 57* 71*  CREATININE 11.05* 13.50*  CALCIUM 8.2* 7.9*    Liver Function Tests: No results for input(s): AST, ALT, ALKPHOS, BILITOT, PROT, ALBUMIN in the last 168 hours. No results for input(s): LIPASE, AMYLASE in the last 168 hours. No results for input(s): AMMONIA in the last 168 hours.  CBC:  Recent Labs Lab 02/26/16 0215 02/27/16 0341  WBC 11.3* 10.3  HGB 13.4 13.0  HCT 42.0 39.0*  MCV 76.8* 76.3*  PLT 245 223    Cardiac Enzymes:  Recent Labs Lab  02/26/16 0215 02/26/16 0808 02/26/16 1329 02/26/16 1942 02/27/16 0341  TROPONINI 0.51* 4.12* 6.19* 8.34* 6.93*    BNP: Invalid input(s): POCBNP  CBG:  Recent Labs Lab 02/26/16 2054 02/27/16 0747  GLUCAP 156* 135*    Microbiology: Results for orders placed or performed during the hospital encounter of 02/26/16  MRSA PCR Screening     Status: None   Collection Time: 02/26/16  6:52 AM  Result Value Ref Range Status   MRSA by PCR NEGATIVE NEGATIVE Final    Comment:        The GeneXpert MRSA Assay (FDA approved for NASAL specimens only), is one component of a comprehensive MRSA colonization surveillance program. It is not intended to diagnose MRSA infection nor to guide or monitor treatment for MRSA infections.     Coagulation Studies: No results for input(s): LABPROT, INR in the last 72 hours.  Urinalysis: No results for input(s): COLORURINE, LABSPEC, PHURINE, GLUCOSEU, HGBUR, BILIRUBINUR, KETONESUR, PROTEINUR, UROBILINOGEN, NITRITE, LEUKOCYTESUR in the last 72 hours.  Invalid input(s): APPERANCEUR    Imaging: Dg Chest 2 View  Result Date: 02/26/2016 CLINICAL DATA:  Initial evaluation for acute right upper sided chest tightness. EXAM: CHEST  2 VIEW COMPARISON:  Prior radiograph from 02/14/2015. FINDINGS: Mild cardiomegaly stable. Coronary stent noted. Mediastinal silhouette within normal limits. Lungs normally inflated. No overt pulmonary edema. No  focal infiltrates. No pleural effusion. No pneumothorax. Vascular stent overlies the left upper extremity/axilla. Retained metallic density within the right upper back noted, stable. No acute osseous abnormality. IMPRESSION: 1. No active cardiopulmonary disease. 2. Stable cardiomegaly without pulmonary edema. Electronically Signed   By: Jeannine Boga M.D.   On: 02/26/2016 03:40     Medications:   . heparin 1,000 Units/hr (02/26/16 2046)   . allopurinol  100 mg Oral Daily  . aspirin EC  81 mg Oral Daily  .  atorvastatin  40 mg Oral q1800  . carvedilol  3.125 mg Oral BID WC  . clopidogrel  75 mg Oral Q breakfast  . docusate sodium  100 mg Oral BID  . insulin aspart  0-5 Units Subcutaneous QHS  . insulin aspart  0-9 Units Subcutaneous TID WC  . levETIRAcetam  500 mg Oral Q12H  . losartan  100 mg Oral Daily  . Melatonin  2.5 mg Oral QHS  . mirtazapine  7.5 mg Oral QHS  . multivitamin  1 tablet Oral QHS  . nitroGLYCERIN  0.5 inch Topical Q6H  . pantoprazole  40 mg Oral Daily  . sodium chloride flush  3 mL Intravenous Q12H   acetaminophen **OR** acetaminophen, nitroGLYCERIN, ondansetron **OR** ondansetron (ZOFRAN) IV, oxyCODONE  Assessment/ Plan:  [name] is a 65 y.o. Asian ([place]) male with ESRD status post renal transplant status post failure and subsequent nephrectomy, hypertension, coronary artery disease, hyperlipidemia, gout, diabetes mellitus type II  [facility] [address]   1. End Stage Renal Disease: seen and examined on hemodialysis - TTS schedule. Low UF due to active ischemia.   2. Hypertension: blood pressure at goal.  - carvedilol, losartan  3. Anemia of chronic kidney disease: hemoglobin at goal. Hold epo due to active ischemia  4. Secondary Hyperparathyroidism: PTH 457 outpatient. Calcium and phosphorus at goal.  - not currently on binders  5. Diabetes mellitus type II: currently diet controlled. Hemoglobin A1c 6.6% as outpateint.     LOS: 1 [name] 9/12/201710:38 AM

## 2016-02-27 NOTE — Progress Notes (Addendum)
Patient is alert and oriented, vss, no complaints of pain.  Patient NSR on monitor.  Groin Site intact.  Transferring to [facility] room 7703 via [facility] transport.    Spoke to [place] Investment banker, corporate at [facility]) for updates on transfer.
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
## 2016-02-27 NOTE — Progress Notes (Signed)
Post Dialysis

## 2016-02-27 NOTE — Progress Notes (Signed)
ANTICOAGULATION CONSULT NOTE - Initial Consult  Pharmacy Consult for Heparin drip Indication: chest pain/ACS (NSTEMI)  Allergies  Allergen Reactions  . Ivp Dye [Iodinated Diagnostic Agents] Other (See Comments)    Pt denied    Patient Measurements: Height: 4\' 9"  (144.8 cm) Weight: 163 lb 9.6 oz (74.2 kg) IBW/kg (Calculated) : 43.1 Heparin Dosing Weight: 62.4 kg   Vital Signs: Temp: 98.1 F (36.7 C) (09/12 0353) Temp Source: Oral (09/12 0353) BP: 133/78 (09/12 0824) Pulse Rate: 88 (09/12 0814)  Labs:  Recent Labs  02/26/16 0215 02/26/16 0808 02/26/16 1329 02/26/16 1942 02/27/16 0341 02/27/16 0820  HGB 13.4  --   --   --  13.0  --   HCT 42.0  --   --   --  39.0*  --   PLT 245  --   --   --  223  --   HEPARINUNFRC  --   --   --   --   --  0.50  CREATININE 11.05*  --   --   --  13.50*  --   TROPONINI 0.51* 4.12* 6.19* 8.34*  --   --     Estimated Creatinine Clearance: 4.3 mL/min (by C-G formula based on SCr of 13.5 mg/dL).   Medical History: Past Medical History:  Diagnosis Date  . Chronic diastolic congestive heart failure ([place])   . Chronic disease anemia   . ESRD (end stage renal disease) on dialysis ([place])    "Davita; [place]; [name]" (09/29/2014)  . GERD (gastroesophageal reflux disease)   . Gout   . High cholesterol   . History of blood transfusion    "related to anemia"  . History of stomach ulcers   . Hypertension   . Type II diabetes mellitus [facility])      Assessment: 65 yo male here with NSTEMI s/p cath pending transfer to [facility] for further procedure. Heparin drip started after cath at 1000 units/hr with no bolus at 2046 on 9/11 per cardiologist.   Baseline CBC: Hgb 13.4, Plt 245; no aPTT or INR available   Goal of Therapy:  Heparin level 0.3-0.7 units/ml Monitor platelets by anticoagulation protocol: Yes   Plan:  Stat heparin level, heparin drip has been running for 10+ hours. Per RN, confirms heparin drip running at 10 ml/hr with no s/sx of  bleeding noted. Hgb and plt count about stable today.   Heparin level at 0820 = 0.50, within goal range. Continue current drip rate. Recheck heparin level in 8h at 1700 to confirm. CBC in AM  Pharmacy will continue to follow.   Rocky Morel 02/27/2016,8:48 AM

## 2016-02-27 NOTE — Progress Notes (Signed)
Dialysis started 

## 2016-02-27 NOTE — Progress Notes (Signed)
Report called to [facility].  Report called to [facility] for transport at 1pm.   Patient will have dialysis at [facility] prior to transport.    Chest Pain has decreased from this morning with addition of nitro paste. Troponins are trending down.

## 2016-02-28 ENCOUNTER — Encounter: Payer: Self-pay | Admitting: Internal Medicine

## 2016-02-29 DIAGNOSIS — Z951 Presence of aortocoronary bypass graft: Secondary | ICD-10-CM | POA: Insufficient documentation

## 2016-03-05 DIAGNOSIS — Z8679 Personal history of other diseases of the circulatory system: Secondary | ICD-10-CM | POA: Insufficient documentation

## 2016-03-05 DIAGNOSIS — R6889 Other general symptoms and signs: Secondary | ICD-10-CM | POA: Insufficient documentation

## 2016-03-05 DIAGNOSIS — R5381 Other malaise: Secondary | ICD-10-CM | POA: Insufficient documentation

## 2016-04-20 ENCOUNTER — Emergency Department
Admission: EM | Admit: 2016-04-20 | Discharge: 2016-04-20 | Disposition: A | Discharge status: Home / Self Care | Payer: BLUE CROSS/BLUE SHIELD | Attending: Emergency Medicine | Admitting: Emergency Medicine

## 2016-04-20 DIAGNOSIS — Z7982 Long term (current) use of aspirin: Secondary | ICD-10-CM | POA: Diagnosis not present

## 2016-04-20 DIAGNOSIS — Z7901 Long term (current) use of anticoagulants: Secondary | ICD-10-CM | POA: Insufficient documentation

## 2016-04-20 DIAGNOSIS — N186 End stage renal disease: Secondary | ICD-10-CM | POA: Diagnosis not present

## 2016-04-20 DIAGNOSIS — I5032 Chronic diastolic (congestive) heart failure: Secondary | ICD-10-CM | POA: Insufficient documentation

## 2016-04-20 DIAGNOSIS — E1122 Type 2 diabetes mellitus with diabetic chronic kidney disease: Secondary | ICD-10-CM | POA: Diagnosis not present

## 2016-04-20 DIAGNOSIS — Z87891 Personal history of nicotine dependence: Secondary | ICD-10-CM | POA: Insufficient documentation

## 2016-04-20 DIAGNOSIS — Z79899 Other long term (current) drug therapy: Secondary | ICD-10-CM | POA: Insufficient documentation

## 2016-04-20 DIAGNOSIS — Z992 Dependence on renal dialysis: Secondary | ICD-10-CM | POA: Insufficient documentation

## 2016-04-20 DIAGNOSIS — I132 Hypertensive heart and chronic kidney disease with heart failure and with stage 5 chronic kidney disease, or end stage renal disease: Secondary | ICD-10-CM | POA: Insufficient documentation

## 2016-04-20 DIAGNOSIS — R55 Syncope and collapse: Secondary | ICD-10-CM | POA: Diagnosis present

## 2016-04-20 LAB — COMPREHENSIVE METABOLIC PANEL
ALK PHOS: 109 U/L (ref 38–126)
ALT: 11 U/L — AB (ref 17–63)
AST: 22 U/L (ref 15–41)
Albumin: 3.6 g/dL (ref 3.5–5.0)
Anion gap: 12 (ref 5–15)
BUN: 18 mg/dL (ref 6–20)
CALCIUM: 8.7 mg/dL — AB (ref 8.9–10.3)
CHLORIDE: 95 mmol/L — AB (ref 101–111)
CO2: 31 mmol/L (ref 22–32)
CREATININE: 5.78 mg/dL — AB (ref 0.61–1.24)
GFR, EST AFRICAN AMERICAN: 11 mL/min — AB (ref >60)
GFR, EST NON AFRICAN AMERICAN: 9 mL/min — AB (ref >60)
Glucose, Bld: 112 mg/dL — ABNORMAL HIGH (ref 65–99)
Potassium: 3.4 mmol/L — ABNORMAL LOW (ref 3.5–5.1)
Sodium: 138 mmol/L (ref 135–145)
Total Bilirubin: 0.9 mg/dL (ref 0.3–1.2)
Total Protein: 8 g/dL (ref 6.5–8.1)

## 2016-04-20 LAB — CBC
HCT: 31.3 % — ABNORMAL LOW (ref 40.0–52.0)
Hemoglobin: 10.3 g/dL — ABNORMAL LOW (ref 13.0–18.0)
MCH: 25.2 pg — AB (ref 26.0–34.0)
MCHC: 33 g/dL (ref 32.0–36.0)
MCV: 76.2 fL — AB (ref 80.0–100.0)
PLATELETS: 263 10*3/uL (ref 150–440)
RBC: 4.1 MIL/uL — AB (ref 4.40–5.90)
RDW: 20.5 % — ABNORMAL HIGH (ref 11.5–14.5)
WBC: 10.8 10*3/uL — ABNORMAL HIGH (ref 3.8–10.6)

## 2016-04-20 LAB — TROPONIN I
TROPONIN I: 0.04 ng/mL — AB (ref <0.03)
TROPONIN I: 0.03 ng/mL — AB (ref <0.03)

## 2016-04-20 NOTE — Progress Notes (Signed)
Decannulated pt access. Left arm graft. Positive for thrill and bruit. Pt alert, vss. Primary RN present.

## 2016-04-20 NOTE — ED Notes (Signed)
Daughter [name])  phone number:   [phone]  Call for discharge

## 2016-04-20 NOTE — ED Provider Notes (Signed)
[facility] Emergency Department Provider Note  Time seen: 9:39 AM  I have reviewed the triage vital signs and the nursing notes.   HISTORY  Chief Complaint Loss of Consciousness    HPI [name] is a 65 y.o. male with multiple medical problems who presents to the emergency department after syncopal episode. According to EMS the patient was at his dialysis center with approximately one hour of dialysis left when he passed out. EMS reports a history of syncope episodes in the past. Upon arrival patient is somnolent, states he does not remember what happened. States he remembers being at dialysis and then remembers being in an ambulance. Patient is oriented to person and place, but not currently to time. Denies any pain or chest pain.  Past Medical History:  Diagnosis Date  . Chronic diastolic congestive heart failure ([place])   . Chronic disease anemia   . ESRD (end stage renal disease) on dialysis ([place])    "Davita; [place]; [name]" (09/29/2014)  . GERD (gastroesophageal reflux disease)   . Gout   . High cholesterol   . History of blood transfusion    "related to anemia"  . History of stomach ulcers   . Hypertension   . Type II diabetes mellitus [facility])     Patient Active Problem List   Diagnosis Date Noted  . NSTEMI (non-ST elevated myocardial infarction) ([place]) 02/26/2016  . Acute respiratory failure with hypoxia ([place]) 02/14/2015  . Syncope 11/05/2014  . Contaminant given to patient   . Shortness of breath   . Viridans streptococci infection   . Polymicrobial bacterial infection   . Bacteremia   . 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 ESRD on dialysis ([place])   . 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 Acute coronary syndrome ([place]) 09/29/2014    Past Surgical History:  Procedure Laterality Date  . ARTERIOVENOUS GRAFT PLACEMENT Left ~ 1996  . CARDIAC CATHETERIZATION  09/29/2014   ""  . CARDIAC CATHETERIZATION N/A 02/26/2016   Procedure: Left Heart Cath and Coronary Angiography Possible PCI;  Surgeon: [name], MD;  Location: [place] CV [facility];  Service: Cardiovascular;  Laterality: N/A;  . KIDNEY TRANSPLANT Right 2004  . NEPHRECTOMY TRANSPLANTED ORGAN  2015  . PERCUTANEOUS CORONARY STENT INTERVENTION (PCI-S) N/A 09/30/2014   Procedure: PERCUTANEOUS CORONARY STENT INTERVENTION (PCI-S);  Surgeon: [name], MD;  Location: [facility] CATH LAB;  Service: Cardiovascular;  Laterality: N/A;  . PERITONEAL CATHETER INSERTION  02/2014  . PERITONEAL CATHETER REMOVAL  08/2014  . THROMBECTOMY / ARTERIOVENOUS GRAFT REVISION  2015    Prior to Admission medications   Medication Sig Start Date End Date Taking? Authorizing Provider  acetaminophen (TYLENOL) 325 MG tablet Take 2 tablets (650 mg total) by mouth every 6 (six) hours as needed for mild pain (or Fever >/= 101). 02/27/16   [name], MD  allopurinol (ZYLOPRIM) 100 MG tablet Take 100 mg by mouth daily. 09/23/14   Historical Provider, MD  aspirin 81 MG EC tablet Take 81 mg by mouth daily. 09/23/14   Historical Provider, MD  atorvastatin (LIPITOR) 40 MG tablet Take 1 tablet (40 mg total) by mouth daily at 6 PM. 11/09/14   [name], MD  carvedilol (COREG) 3.125 MG tablet Take 3.125 mg by mouth 2 (two) times daily with a meal.    Historical Provider, MD  clopidogrel (PLAVIX) 75 MG tablet Take 1 tablet (75 mg total) by mouth daily  with breakfast. 10/02/14   [name], MD  heparin 100-0.45 UNIT/ML-% infusion Inject 1,000 Units/hr into the vein continuous. 02/27/16   [name], MD  levETIRAcetam (KEPPRA) 500 MG tablet Take 500 mg by mouth every 12 (twelve) hours.    Historical Provider, MD  losartan (COZAAR) 100 MG tablet Take 1 tablet (100 mg total) by mouth daily. 11/09/14   [name], MD  Melatonin 3 MG TABS Take 3 mg by mouth at bedtime.    Historical Provider, MD  mirtazapine (REMERON) 7.5 MG  tablet Take 7.5 mg by mouth at bedtime.    Historical Provider, MD  multivitamin (RENA-VIT) TABS tablet Take 1 tablet by mouth at bedtime. 10/02/14   [name], MD  nitroGLYCERIN (NITROGLYN) 2 % ointment Apply 0.5 inches topically every 6 (six) hours. 02/27/16   [name], MD  nitroGLYCERIN (NITROSTAT) 0.4 MG SL tablet Place 1 tablet (0.4 mg total) under the tongue every 5 (five) minutes x 3 doses as needed for chest pain. 10/02/14   [name], MD  pantoprazole (PROTONIX) 40 MG tablet Take 40 mg by mouth daily. 01/17/14   Historical Provider, MD    Allergies  Allergen Reactions  . Ivp Dye [Iodinated Diagnostic Agents] Other (See Comments)    Pt denied    Family History  Problem Relation Age of Onset  . Hypertension Mother   . Stroke Mother   . Hypertension Father   . Diabetes Mellitus II Father   . Asthma Father     Social History Social History  Substance Use Topics  . Smoking status: Former Smoker    Types: Cigarettes  . Smokeless tobacco: Never Used     Comment: "quit smoking cigarettes in the 1980's"  . Alcohol use No    Review of Systems Constitutional: Negative for fever. Cardiovascular: Negative for chest pain. Respiratory: Negative for shortness of breath. Gastrointestinal: Negative for abdominal pain Neurological: Negative for headache 10-point ROS otherwise negative.  ____________________________________________   PHYSICAL EXAM:  VITAL SIGNS: ED Triage Vitals  Enc Vitals Group     BP 04/20/16 0927 118/61     Pulse Rate 04/20/16 0927 72     Resp 04/20/16 0927 20     Temp 04/20/16 0927 97.6 F (36.4 C)     Temp Source 04/20/16 0927 Oral     SpO2 04/20/16 0927 98 %     Weight 04/20/16 0928 158 lb (71.7 kg)     Height 04/20/16 0928 5' (1.524 m)     Head Circumference --      Peak Flow --      Pain Score --      Pain Loc --      Pain Edu? --      Excl. in [place]? --     Constitutional: Alert and oriented to place, person, situation. Well  appearing and in no distress. Eyes: Normal exam ENT   Head: Normocephalic and atraumatic.   Mouth/Throat: Mucous membranes are moist. Cardiovascular: Normal rate, regular rhythm. No murmur Respiratory: Normal respiratory effort without tachypnea nor retractions. Breath sounds are clear  Gastrointestinal: Soft and nontender. No distention.   Musculoskeletal: Nontender with normal range of motion in all extremities. AV fistula in left upper extremity currently accessed. Neurologic:  Somnolent appearing, moves all extremities. Able to answer questions. Skin:  Skin is warm, dry and intact.  Psychiatric: Mood and affect are normal.   ____________________________________________    EKG  EKG reviewed and interpreted by myself appears to show atrial fibrillation  at 72 bpm, narrow QRS, normal axis, prolonged QTC of 551 ms, nonspecific but no concerning ST changes.  Repeat EKG 14:14:01 interpreted by myself shows normal sinus rhythm at 75 bpm, narrow QS, normal axis, normal intervals, no ST changes. Normal EKG.  ____________________________________________   INITIAL IMPRESSION / ASSESSMENT AND PLAN / ED COURSE  Pertinent labs & imaging results that were available during my care of the patient were reviewed by me and considered in my medical decision making (see chart for details).  Patient presents the emergency department for syncopal episode.  EMS patient's heart rate was dropping into the 40s during transport to the hospital. Patient does not recall the syncopal event. Denies any chest pain. Recently was admitted for an NSTEMI. Patient's EKG appears to be consistent with atrial fibrillation which appears to be new for the patient. We will check labs and closely monitor in the emergency department on telemetry.  Patient's labs are largely at his baseline. Repeat troponin unchanged. Repeat EKG is normal. We will discharge with cardiology follow-up. Patient appears very well, no  complaints at this time. Is asking to be discharged. ____________________________________________   FINAL CLINICAL IMPRESSION(S) / ED DIAGNOSES  Syncope     Harvest Dark, MD 04/20/16 1500

## 2016-04-20 NOTE — ED Triage Notes (Signed)
Pt presents via EMS s/p syncopal episode while at dialysis. Pt has hx of same. Hx AMS. Oriented to person, and place but not time. Dialysis fistula accessed on left side.

## 2016-06-25 DIAGNOSIS — R001 Bradycardia, unspecified: Secondary | ICD-10-CM | POA: Insufficient documentation

## 2016-07-18 ENCOUNTER — Other Ambulatory Visit
Admission: RE | Admit: 2016-07-18 | Discharge: 2016-07-18 | Disposition: A | Discharge status: Home / Self Care | Payer: BLUE CROSS/BLUE SHIELD | Source: Ambulatory Visit | Attending: Nephrology | Admitting: Nephrology

## 2016-07-18 DIAGNOSIS — N186 End stage renal disease: Secondary | ICD-10-CM | POA: Diagnosis present

## 2016-07-18 LAB — POTASSIUM: Potassium: 3.5 mmol/L (ref 3.5–5.1)

## 2016-07-23 ENCOUNTER — Inpatient Hospital Stay
Admission: EM | Admit: 2016-07-23 | Discharge: 2016-07-29 | DRG: 193 | Disposition: A | Discharge status: Home / Self Care | Payer: BLUE CROSS/BLUE SHIELD | Attending: Specialist | Admitting: Specialist

## 2016-07-23 ENCOUNTER — Emergency Department: Payer: BLUE CROSS/BLUE SHIELD

## 2016-07-23 DIAGNOSIS — M546 Pain in thoracic spine: Secondary | ICD-10-CM | POA: Diagnosis present

## 2016-07-23 DIAGNOSIS — E875 Hyperkalemia: Secondary | ICD-10-CM | POA: Diagnosis present

## 2016-07-23 DIAGNOSIS — R001 Bradycardia, unspecified: Secondary | ICD-10-CM | POA: Diagnosis not present

## 2016-07-23 DIAGNOSIS — T466X5A Adverse effect of antihyperlipidemic and antiarteriosclerotic drugs, initial encounter: Secondary | ICD-10-CM | POA: Diagnosis not present

## 2016-07-23 DIAGNOSIS — J189 Pneumonia, unspecified organism: Secondary | ICD-10-CM | POA: Diagnosis not present

## 2016-07-23 DIAGNOSIS — R778 Other specified abnormalities of plasma proteins: Secondary | ICD-10-CM | POA: Diagnosis present

## 2016-07-23 DIAGNOSIS — R262 Difficulty in walking, not elsewhere classified: Secondary | ICD-10-CM

## 2016-07-23 DIAGNOSIS — I5042 Chronic combined systolic (congestive) and diastolic (congestive) heart failure: Secondary | ICD-10-CM | POA: Diagnosis present

## 2016-07-23 DIAGNOSIS — M109 Gout, unspecified: Secondary | ICD-10-CM | POA: Diagnosis present

## 2016-07-23 DIAGNOSIS — N2581 Secondary hyperparathyroidism of renal origin: Secondary | ICD-10-CM | POA: Diagnosis present

## 2016-07-23 DIAGNOSIS — I252 Old myocardial infarction: Secondary | ICD-10-CM

## 2016-07-23 DIAGNOSIS — I952 Hypotension due to drugs: Secondary | ICD-10-CM | POA: Diagnosis not present

## 2016-07-23 DIAGNOSIS — T447X5A Adverse effect of beta-adrenoreceptor antagonists, initial encounter: Secondary | ICD-10-CM | POA: Diagnosis not present

## 2016-07-23 DIAGNOSIS — Z955 Presence of coronary angioplasty implant and graft: Secondary | ICD-10-CM

## 2016-07-23 DIAGNOSIS — G9341 Metabolic encephalopathy: Secondary | ICD-10-CM | POA: Diagnosis not present

## 2016-07-23 DIAGNOSIS — N186 End stage renal disease: Secondary | ICD-10-CM | POA: Diagnosis present

## 2016-07-23 DIAGNOSIS — Z7982 Long term (current) use of aspirin: Secondary | ICD-10-CM

## 2016-07-23 DIAGNOSIS — Y95 Nosocomial condition: Secondary | ICD-10-CM | POA: Diagnosis present

## 2016-07-23 DIAGNOSIS — K219 Gastro-esophageal reflux disease without esophagitis: Secondary | ICD-10-CM | POA: Diagnosis present

## 2016-07-23 DIAGNOSIS — Z7902 Long term (current) use of antithrombotics/antiplatelets: Secondary | ICD-10-CM

## 2016-07-23 DIAGNOSIS — Z992 Dependence on renal dialysis: Secondary | ICD-10-CM

## 2016-07-23 DIAGNOSIS — E785 Hyperlipidemia, unspecified: Secondary | ICD-10-CM | POA: Diagnosis present

## 2016-07-23 DIAGNOSIS — R945 Abnormal results of liver function studies: Secondary | ICD-10-CM

## 2016-07-23 DIAGNOSIS — Z951 Presence of aortocoronary bypass graft: Secondary | ICD-10-CM

## 2016-07-23 DIAGNOSIS — R4182 Altered mental status, unspecified: Secondary | ICD-10-CM

## 2016-07-23 DIAGNOSIS — Z87891 Personal history of nicotine dependence: Secondary | ICD-10-CM

## 2016-07-23 DIAGNOSIS — G40909 Epilepsy, unspecified, not intractable, without status epilepticus: Secondary | ICD-10-CM | POA: Diagnosis present

## 2016-07-23 DIAGNOSIS — M549 Dorsalgia, unspecified: Secondary | ICD-10-CM | POA: Diagnosis not present

## 2016-07-23 DIAGNOSIS — R7989 Other specified abnormal findings of blood chemistry: Secondary | ICD-10-CM | POA: Diagnosis not present

## 2016-07-23 DIAGNOSIS — M6281 Muscle weakness (generalized): Secondary | ICD-10-CM

## 2016-07-23 DIAGNOSIS — Z79899 Other long term (current) drug therapy: Secondary | ICD-10-CM

## 2016-07-23 DIAGNOSIS — D631 Anemia in chronic kidney disease: Secondary | ICD-10-CM | POA: Diagnosis present

## 2016-07-23 DIAGNOSIS — I132 Hypertensive heart and chronic kidney disease with heart failure and with stage 5 chronic kidney disease, or end stage renal disease: Secondary | ICD-10-CM | POA: Diagnosis present

## 2016-07-23 DIAGNOSIS — G9340 Encephalopathy, unspecified: Secondary | ICD-10-CM

## 2016-07-23 DIAGNOSIS — I2510 Atherosclerotic heart disease of native coronary artery without angina pectoris: Secondary | ICD-10-CM | POA: Diagnosis present

## 2016-07-23 DIAGNOSIS — E1122 Type 2 diabetes mellitus with diabetic chronic kidney disease: Secondary | ICD-10-CM | POA: Diagnosis present

## 2016-07-23 LAB — CBC WITH DIFFERENTIAL/PLATELET
BASOS ABS: 0 10*3/uL (ref 0–0.1)
BASOS PCT: 0 %
Eosinophils Absolute: 0.2 10*3/uL (ref 0–0.7)
Eosinophils Relative: 1 %
HEMATOCRIT: 43.6 % (ref 40.0–52.0)
HEMOGLOBIN: 13.8 g/dL (ref 13.0–18.0)
Lymphocytes Relative: 10 %
Lymphs Abs: 1.4 10*3/uL (ref 1.0–3.6)
MCH: 23.6 pg — ABNORMAL LOW (ref 26.0–34.0)
MCHC: 31.7 g/dL — ABNORMAL LOW (ref 32.0–36.0)
MCV: 74.4 fL — ABNORMAL LOW (ref 80.0–100.0)
MONOS PCT: 2 %
Monocytes Absolute: 0.3 10*3/uL (ref 0.2–1.0)
NEUTROS ABS: 12.6 10*3/uL — AB (ref 1.4–6.5)
NEUTROS PCT: 87 %
Platelets: 285 10*3/uL (ref 150–440)
RBC: 5.86 MIL/uL (ref 4.40–5.90)
RDW: 20.1 % — ABNORMAL HIGH (ref 11.5–14.5)
WBC: 14.6 10*3/uL — AB (ref 3.8–10.6)

## 2016-07-23 LAB — BASIC METABOLIC PANEL
Anion gap: 17 — ABNORMAL HIGH (ref 5–15)
BUN: 46 mg/dL — ABNORMAL HIGH (ref 6–20)
CHLORIDE: 93 mmol/L — AB (ref 101–111)
CO2: 28 mmol/L (ref 22–32)
Calcium: 8.1 mg/dL — ABNORMAL LOW (ref 8.9–10.3)
Creatinine, Ser: 9.43 mg/dL — ABNORMAL HIGH (ref 0.61–1.24)
GFR calc non Af Amer: 5 mL/min — ABNORMAL LOW (ref >60)
GFR, EST AFRICAN AMERICAN: 6 mL/min — AB (ref >60)
Glucose, Bld: 180 mg/dL — ABNORMAL HIGH (ref 65–99)
POTASSIUM: 5.3 mmol/L — AB (ref 3.5–5.1)
SODIUM: 138 mmol/L (ref 135–145)

## 2016-07-23 LAB — TROPONIN I: Troponin I: 0.03 ng/mL (ref <0.03)

## 2016-07-23 MED ORDER — ONDANSETRON HCL 4 MG/2ML IJ SOLN
4.0000 mg | Freq: Once | INTRAMUSCULAR | Status: AC
  Administered 2016-07-23: 4 mg via INTRAVENOUS
  Filled 2016-07-23: qty 2

## 2016-07-23 MED ORDER — MORPHINE SULFATE (PF) 4 MG/ML IV SOLN
4.0000 mg | Freq: Once | INTRAVENOUS | Status: AC
  Administered 2016-07-23: 4 mg via INTRAVENOUS

## 2016-07-23 MED ORDER — OXYCODONE-ACETAMINOPHEN 5-325 MG PO TABS
ORAL_TABLET | ORAL | Status: AC
  Administered 2016-07-23: 1 via ORAL
  Filled 2016-07-23: qty 1

## 2016-07-23 MED ORDER — VANCOMYCIN HCL IN DEXTROSE 1-5 GM/200ML-% IV SOLN: 1000.0000 mg | Freq: Once | INTRAVENOUS | Status: DC

## 2016-07-23 MED ORDER — CEFEPIME-DEXTROSE 2 GM/50ML IV SOLR
2.0000 g | Freq: Once | INTRAVENOUS | Status: AC
  Administered 2016-07-23: 2 g via INTRAVENOUS
  Filled 2016-07-23: qty 50

## 2016-07-23 MED ORDER — MORPHINE SULFATE (PF) 2 MG/ML IV SOLN
INTRAVENOUS | Status: AC
  Filled 2016-07-23: qty 2

## 2016-07-23 MED ORDER — VANCOMYCIN HCL 10 G IV SOLR
1500.0000 mg | Freq: Once | INTRAVENOUS | Status: DC
  Filled 2016-07-23: qty 1500

## 2016-07-23 MED ORDER — OXYCODONE-ACETAMINOPHEN 5-325 MG PO TABS
1.0000 | ORAL_TABLET | Freq: Once | ORAL | Status: AC
  Administered 2016-07-23: 1 via ORAL

## 2016-07-23 NOTE — ED Notes (Signed)
Elevated troponin of 0.03 reported to Dr Clearnce Hasten

## 2016-07-23 NOTE — ED Provider Notes (Signed)
[facility] Emergency Department Provider Note   ____________________________________________   First MD Initiated Contact with Patient 07/23/16 2144     (approximate)  I have reviewed the triage vital signs and the nursing notes.   HISTORY  Chief Complaint Back Pain   HPI [name] is a 66 y.o. male with a history of end-stage renal disease on dialysis was presenting with right-sided thoracic pain since 10 AM this morning. He says the pain is a 9 out of 10 now and sharp. He says it is worse with movement. Denies any chest pain. Denies any nausea or vomiting. Denies any abdominal pain. Denies any fever.   Past Medical History:  Diagnosis Date  . Chronic diastolic congestive heart failure ([place])   . Chronic disease anemia   . ESRD (end stage renal disease) on dialysis ([place])    "Davita; [place]; [name]" (09/29/2014)  . GERD (gastroesophageal reflux disease)   . Gout   . High cholesterol   . History of blood transfusion    "related to anemia"  . History of stomach ulcers   . Hypertension   . Type II diabetes mellitus [facility])     Patient Active Problem List   Diagnosis Date Noted  . NSTEMI (non-ST elevated myocardial infarction) ([place]) 02/26/2016  . Acute respiratory failure with hypoxia ([place]) 02/14/2015  . Syncope 11/05/2014  . Contaminant given to patient   . Shortness of breath   . Viridans streptococci infection   . Polymicrobial bacterial infection   . Bacteremia   . 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 ESRD on dialysis ([place])   . 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 Acute coronary syndrome ([place]) 09/29/2014    Past Surgical History:  Procedure Laterality Date  . ARTERIOVENOUS GRAFT PLACEMENT Left ~ 1996  . CARDIAC CATHETERIZATION  09/29/2014   ""  . CARDIAC CATHETERIZATION N/A 02/26/2016   Procedure: Left Heart Cath and Coronary Angiography Possible PCI;   Surgeon: [name], MD;  Location: [place] CV LAB;  Service: Cardiovascular;  Laterality: N/A;  . KIDNEY TRANSPLANT Right 2004  . NEPHRECTOMY TRANSPLANTED ORGAN  2015  . PERCUTANEOUS CORONARY STENT INTERVENTION (PCI-S) N/A 09/30/2014   Procedure: PERCUTANEOUS CORONARY STENT INTERVENTION (PCI-S);  Surgeon: [name], MD;  Location: [facility] CATH LAB;  Service: Cardiovascular;  Laterality: N/A;  . PERITONEAL CATHETER INSERTION  02/2014  . PERITONEAL CATHETER REMOVAL  08/2014  . THROMBECTOMY / ARTERIOVENOUS GRAFT REVISION  2015    Prior to Admission medications   Medication Sig Start Date End Date Taking? Authorizing Provider  acetaminophen (TYLENOL) 325 MG tablet Take 2 tablets (650 mg total) by mouth every 6 (six) hours as needed for mild pain (or Fever >/= 101). 02/27/16   [name], MD  allopurinol (ZYLOPRIM) 100 MG tablet Take 100 mg by mouth daily. 09/23/14   Historical Provider, MD  aspirin 81 MG EC tablet Take 81 mg by mouth daily. 09/23/14   Historical Provider, MD  atorvastatin (LIPITOR) 40 MG tablet Take 1 tablet (40 mg total) by mouth daily at 6 PM. 11/09/14   [name], MD  carvedilol (COREG) 3.125 MG tablet Take 3.125 mg by mouth 2 (two) times daily with a meal.    Historical Provider, MD  clopidogrel (PLAVIX) 75 MG tablet Take 1 tablet (75 mg total) by mouth daily with breakfast. 10/02/14   [name], MD  heparin 100-0.45 UNIT/ML-% infusion Inject 1,000 Units/hr into  the vein continuous. 02/27/16   [name], MD  levETIRAcetam (KEPPRA) 500 MG tablet Take 500 mg by mouth every 12 (twelve) hours.    Historical Provider, MD  losartan (COZAAR) 100 MG tablet Take 1 tablet (100 mg total) by mouth daily. 11/09/14   [name], MD  Melatonin 3 MG TABS Take 3 mg by mouth at bedtime.    Historical Provider, MD  mirtazapine (REMERON) 7.5 MG tablet Take 7.5 mg by mouth at bedtime.    Historical Provider, MD  multivitamin (RENA-VIT) TABS tablet Take 1 tablet by mouth  at bedtime. 10/02/14   [name], MD  nitroGLYCERIN (NITROGLYN) 2 % ointment Apply 0.5 inches topically every 6 (six) hours. 02/27/16   [name], MD  nitroGLYCERIN (NITROSTAT) 0.4 MG SL tablet Place 1 tablet (0.4 mg total) under the tongue every 5 (five) minutes x 3 doses as needed for chest pain. 10/02/14   [name], MD  pantoprazole (PROTONIX) 40 MG tablet Take 40 mg by mouth daily. 01/17/14   Historical Provider, MD    Allergies Ivp dye [iodinated diagnostic agents]  Family History  Problem Relation Age of Onset  . Hypertension Mother   . Stroke Mother   . Hypertension Father   . Diabetes Mellitus II Father   . Asthma Father     Social History Social History  Substance Use Topics  . Smoking status: Former Smoker    Types: Cigarettes  . Smokeless tobacco: Never Used     Comment: "quit smoking cigarettes in the 1980's"  . Alcohol use No    Review of Systems Constitutional: No fever/chills Eyes: No visual changes. ENT: No sore throat. Cardiovascular: Denies chest pain. Respiratory: Denies shortness of breath. Gastrointestinal: No abdominal pain.  No nausea, no vomiting.  No diarrhea.  No constipation. Genitourinary: Negative for dysuria. Musculoskeletal: As above Skin: Negative for rash. Neurological: Negative for headaches, focal weakness or numbness.  10-point ROS otherwise negative.  ____________________________________________   PHYSICAL EXAM:  VITAL SIGNS: ED Triage Vitals  Enc Vitals Group     BP 07/23/16 2142 120/78     Pulse Rate 07/23/16 2142 83     Resp 07/23/16 2142 (!) 22     Temp 07/23/16 2142 98 F (36.7 C)     Temp Source 07/23/16 2142 Oral     SpO2 07/23/16 2142 96 %     Weight 07/23/16 2142 140 lb (63.5 kg)     Height 07/23/16 2142 5\' 4"  (1.626 m)     Head Circumference --      Peak Flow --      Pain Score 07/23/16 2143 10     Pain Loc --      Pain Edu? --      Excl. in [place]? --     Constitutional: Alert and oriented.  Appears uncomfortable. Eyes: Conjunctivae are normal. PERRL. EOMI. Head: Atraumatic. Nose: No congestion/rhinnorhea. Mouth/Throat: Mucous membranes are moist.   Neck: No stridor.   Cardiovascular: Normal rate, regular rhythm. Grossly normal heart sounds.  Good peripheral circulation . Equal bilateral dorsalis pedis pulses. Palpable thrill to the left upper extremity with a present pulse to the right radial.. Respiratory: Normal respiratory effort.  No retractions. Lungs CTAB. Gastrointestinal: Soft and nontender. No distention.  Musculoskeletal: No lower extremity tenderness nor edema.  No joint effusions.  Tender to palpation over the right medial thoracic region. No midline tenderness to palpation. Neurologic:  Normal speech and language. No gross focal neurologic deficits are appreciated.  Skin:  Skin is warm, dry and intact. No rash noted. Psychiatric: Mood and affect are normal. Speech and behavior are normal.  ____________________________________________   LABS (all labs ordered are listed, but only abnormal results are displayed)  Labs Reviewed  CBC WITH DIFFERENTIAL/PLATELET - Abnormal; Notable for the following:       Result Value   WBC 14.6 (*)    MCV 74.4 (*)    MCH 23.6 (*)    MCHC 31.7 (*)    RDW 20.1 (*)    Neutro Abs 12.6 (*)    All other components within normal limits  BASIC METABOLIC PANEL - Abnormal; Notable for the following:    Potassium 5.3 (*)    Chloride 93 (*)    Glucose, Bld 180 (*)    BUN 46 (*)    Creatinine, Ser 9.43 (*)    Calcium 8.1 (*)    GFR calc non Af Amer 5 (*)    GFR calc Af Amer 6 (*)    Anion gap 17 (*)    All other components within normal limits  TROPONIN I - Abnormal; Notable for the following:    Troponin I 0.03 (*)    All other components within normal limits   ____________________________________________  EKG  ED ECG REPORT I, Doran Stabler, the attending physician, personally viewed and interpreted this ECG.    Date: 07/23/2016  EKG Time: 2144  Rate: 82  Rhythm: normal sinus rhythm  Axis: Normal  Intervals:none  ST&T Change: No ST segment elevation or depression. No abnormal T-wave inversion.  ____________________________________________  [id number]    DG Chest 1 View (Final result)  Result time 07/23/16 22:13:37  Final result by Lucienne Capers, MD (07/23/16 22:13:37)           Narrative:   CLINICAL DATA: Back pain. History of CHF, hypertension.  EXAM: CHEST 1 VIEW  COMPARISON: 02/26/2016  FINDINGS: Postoperative changes in the mediastinum. Vascular graft in the left upper extremity and axilla. Shallow inspiration. Mild cardiac enlargement without vascular congestion. There is infiltration or atelectasis in the left lung base behind the heart with possible small left pleural effusion. This may indicate an pneumonia. Right lung is clear. No pneumothorax.  IMPRESSION: Infiltration in the left lung base with probable effusion suggesting pneumonia. Followup PA and lateral chest X-ray is recommended in 3-4 weeks following appropriate clinical therapy to ensure resolution and exclude underlying malignancy.   Electronically Signed By: Lucienne Capers M.D. On: 07/23/2016 22:13          ____________________________________________   PROCEDURES  Procedure(s) performed:   Procedures  Critical Care performed:   ____________________________________________   INITIAL IMPRESSION / ASSESSMENT AND PLAN / ED COURSE  Pertinent labs & imaging results that were available during my care of the patient were reviewed by me and considered in my medical decision making (see chart for details).  ----------------------------------------- 11:32 PM on 07/23/2016 -----------------------------------------  Patient found to have pneumonia on x-ray. Reproducible pain to palpation. Not hypertensive. Did not describe pain as a tearing or ripping pain and increased gradually  since about 10 AM this morning. Less likely to be aortic catastrophe as the pain is reproducible with palpation and also having pneumonia present with an elevated white blood cell count. Patient will be admitted to the hospital. Signed out to Dr. Ara Kussmaul.       ____________________________________________   FINAL CLINICAL IMPRESSION(S) / ED DIAGNOSES  Back pain. Pneumonia.    NEW MEDICATIONS STARTED DURING THIS VISIT:  New Prescriptions  No medications on file     Note:  This document was prepared using Dragon voice recognition software and may include unintentional dictation errors.    Orbie Pyo, MD 07/23/16 [phone]

## 2016-07-23 NOTE — ED Triage Notes (Signed)
Pt arrived via ems for c/o back pain - denies chest pain and denies shortness of breath - pain is not radiating - pt has history of dialysis (went today) and bypass surgery last year - en route ems gave him 4 asa and 1 nitro spray

## 2016-07-23 NOTE — ED Notes (Signed)
Pt arrived via ems for c/o back pain - denies chest pain and denies shortness of breath - pain is not radiating - pt has history of dialysis (went today) and bypass surgery last year - en route ems gave him 4 asa and 1 nitro spray

## 2016-07-24 ENCOUNTER — Inpatient Hospital Stay: Payer: BLUE CROSS/BLUE SHIELD

## 2016-07-24 DIAGNOSIS — G9341 Metabolic encephalopathy: Secondary | ICD-10-CM | POA: Diagnosis not present

## 2016-07-24 DIAGNOSIS — M109 Gout, unspecified: Secondary | ICD-10-CM | POA: Diagnosis present

## 2016-07-24 DIAGNOSIS — R001 Bradycardia, unspecified: Secondary | ICD-10-CM | POA: Diagnosis not present

## 2016-07-24 DIAGNOSIS — Z7982 Long term (current) use of aspirin: Secondary | ICD-10-CM | POA: Diagnosis not present

## 2016-07-24 DIAGNOSIS — E1122 Type 2 diabetes mellitus with diabetic chronic kidney disease: Secondary | ICD-10-CM | POA: Diagnosis present

## 2016-07-24 DIAGNOSIS — E875 Hyperkalemia: Secondary | ICD-10-CM | POA: Diagnosis present

## 2016-07-24 DIAGNOSIS — Z955 Presence of coronary angioplasty implant and graft: Secondary | ICD-10-CM | POA: Diagnosis not present

## 2016-07-24 DIAGNOSIS — Z951 Presence of aortocoronary bypass graft: Secondary | ICD-10-CM | POA: Diagnosis not present

## 2016-07-24 DIAGNOSIS — Z7902 Long term (current) use of antithrombotics/antiplatelets: Secondary | ICD-10-CM | POA: Diagnosis not present

## 2016-07-24 DIAGNOSIS — N2581 Secondary hyperparathyroidism of renal origin: Secondary | ICD-10-CM | POA: Diagnosis present

## 2016-07-24 DIAGNOSIS — R4182 Altered mental status, unspecified: Secondary | ICD-10-CM | POA: Diagnosis not present

## 2016-07-24 DIAGNOSIS — I5042 Chronic combined systolic (congestive) and diastolic (congestive) heart failure: Secondary | ICD-10-CM | POA: Diagnosis present

## 2016-07-24 DIAGNOSIS — Z992 Dependence on renal dialysis: Secondary | ICD-10-CM | POA: Diagnosis not present

## 2016-07-24 DIAGNOSIS — Z79899 Other long term (current) drug therapy: Secondary | ICD-10-CM | POA: Diagnosis not present

## 2016-07-24 DIAGNOSIS — I2510 Atherosclerotic heart disease of native coronary artery without angina pectoris: Secondary | ICD-10-CM | POA: Diagnosis present

## 2016-07-24 DIAGNOSIS — I132 Hypertensive heart and chronic kidney disease with heart failure and with stage 5 chronic kidney disease, or end stage renal disease: Secondary | ICD-10-CM | POA: Diagnosis present

## 2016-07-24 DIAGNOSIS — G40909 Epilepsy, unspecified, not intractable, without status epilepticus: Secondary | ICD-10-CM | POA: Diagnosis present

## 2016-07-24 DIAGNOSIS — J189 Pneumonia, unspecified organism: Secondary | ICD-10-CM | POA: Diagnosis present

## 2016-07-24 DIAGNOSIS — E785 Hyperlipidemia, unspecified: Secondary | ICD-10-CM | POA: Diagnosis present

## 2016-07-24 DIAGNOSIS — Z87891 Personal history of nicotine dependence: Secondary | ICD-10-CM | POA: Diagnosis not present

## 2016-07-24 DIAGNOSIS — N186 End stage renal disease: Secondary | ICD-10-CM | POA: Diagnosis present

## 2016-07-24 DIAGNOSIS — D631 Anemia in chronic kidney disease: Secondary | ICD-10-CM | POA: Diagnosis present

## 2016-07-24 DIAGNOSIS — Y95 Nosocomial condition: Secondary | ICD-10-CM | POA: Diagnosis present

## 2016-07-24 DIAGNOSIS — M546 Pain in thoracic spine: Secondary | ICD-10-CM | POA: Diagnosis present

## 2016-07-24 DIAGNOSIS — M549 Dorsalgia, unspecified: Secondary | ICD-10-CM | POA: Diagnosis present

## 2016-07-24 DIAGNOSIS — K219 Gastro-esophageal reflux disease without esophagitis: Secondary | ICD-10-CM | POA: Diagnosis present

## 2016-07-24 DIAGNOSIS — G9340 Encephalopathy, unspecified: Secondary | ICD-10-CM | POA: Diagnosis not present

## 2016-07-24 DIAGNOSIS — I952 Hypotension due to drugs: Secondary | ICD-10-CM | POA: Diagnosis not present

## 2016-07-24 DIAGNOSIS — R400 Somnolence: Secondary | ICD-10-CM | POA: Diagnosis not present

## 2016-07-24 DIAGNOSIS — I252 Old myocardial infarction: Secondary | ICD-10-CM | POA: Diagnosis not present

## 2016-07-24 LAB — GLUCOSE, CAPILLARY
GLUCOSE-CAPILLARY: 149 mg/dL — AB (ref 65–99)
GLUCOSE-CAPILLARY: 194 mg/dL — AB (ref 65–99)
GLUCOSE-CAPILLARY: 115 mg/dL — AB (ref 65–99)
Glucose-Capillary: 220 mg/dL — ABNORMAL HIGH (ref 65–99)

## 2016-07-24 LAB — CBC WITH DIFFERENTIAL/PLATELET
BASOS PCT: 0 %
Basophils Absolute: 0 10*3/uL (ref 0–0.1)
EOS ABS: 0.1 10*3/uL (ref 0–0.7)
EOS PCT: 1 %
HCT: 41.5 % (ref 40.0–52.0)
Hemoglobin: 13.2 g/dL (ref 13.0–18.0)
Lymphocytes Relative: 5 %
Lymphs Abs: 0.9 10*3/uL — ABNORMAL LOW (ref 1.0–3.6)
MCH: 23.8 pg — AB (ref 26.0–34.0)
MCHC: 31.8 g/dL — AB (ref 32.0–36.0)
MCV: 74.9 fL — ABNORMAL LOW (ref 80.0–100.0)
MONO ABS: 0.9 10*3/uL (ref 0.2–1.0)
MONOS PCT: 5 %
NEUTROS PCT: 89 %
Neutro Abs: 18.1 10*3/uL — ABNORMAL HIGH (ref 1.4–6.5)
PLATELETS: 250 10*3/uL (ref 150–440)
RBC: 5.54 MIL/uL (ref 4.40–5.90)
RDW: 19.9 % — AB (ref 11.5–14.5)
WBC: 20.1 10*3/uL — ABNORMAL HIGH (ref 3.8–10.6)

## 2016-07-24 LAB — TROPONIN I
TROPONIN I: 0.04 ng/mL — AB (ref <0.03)
TROPONIN I: 0.06 ng/mL — AB (ref <0.03)
Troponin I: 0.03 ng/mL (ref <0.03)

## 2016-07-24 LAB — BLOOD GAS, ARTERIAL
Acid-base deficit: 0.7 mmol/L (ref 0.0–2.0)
Bicarbonate: 23.2 mmol/L (ref 20.0–28.0)
FIO2: 0.21
O2 Saturation: 91.3 %
PATIENT TEMPERATURE: 37
pCO2 arterial: 35 mmHg (ref 32.0–48.0)
pH, Arterial: 7.43 (ref 7.350–7.450)
pO2, Arterial: 60 mmHg — ABNORMAL LOW (ref 83.0–108.0)

## 2016-07-24 LAB — COMPREHENSIVE METABOLIC PANEL
ALBUMIN: 4 g/dL (ref 3.5–5.0)
ALT: 414 U/L — ABNORMAL HIGH (ref 17–63)
ANION GAP: 20 — AB (ref 5–15)
AST: 575 U/L — ABNORMAL HIGH (ref 15–41)
Alkaline Phosphatase: 347 U/L — ABNORMAL HIGH (ref 38–126)
BUN: 54 mg/dL — ABNORMAL HIGH (ref 6–20)
CO2: 23 mmol/L (ref 22–32)
Calcium: 8.2 mg/dL — ABNORMAL LOW (ref 8.9–10.3)
Chloride: 95 mmol/L — ABNORMAL LOW (ref 101–111)
Creatinine, Ser: 10.42 mg/dL — ABNORMAL HIGH (ref 0.61–1.24)
GFR calc non Af Amer: 5 mL/min — ABNORMAL LOW (ref >60)
GFR, EST AFRICAN AMERICAN: 5 mL/min — AB (ref >60)
GLUCOSE: 169 mg/dL — AB (ref 65–99)
POTASSIUM: 5.3 mmol/L — AB (ref 3.5–5.1)
SODIUM: 138 mmol/L (ref 135–145)
TOTAL PROTEIN: 8.9 g/dL — AB (ref 6.5–8.1)
Total Bilirubin: 3.7 mg/dL — ABNORMAL HIGH (ref 0.3–1.2)

## 2016-07-24 LAB — BASIC METABOLIC PANEL
Anion gap: 17 — ABNORMAL HIGH (ref 5–15)
BUN: 64 mg/dL — AB (ref 6–20)
CALCIUM: 7.6 mg/dL — AB (ref 8.9–10.3)
CO2: 26 mmol/L (ref 22–32)
CREATININE: 11.68 mg/dL — AB (ref 0.61–1.24)
Chloride: 95 mmol/L — ABNORMAL LOW (ref 101–111)
GFR calc Af Amer: 5 mL/min — ABNORMAL LOW (ref >60)
GFR, EST NON AFRICAN AMERICAN: 4 mL/min — AB (ref >60)
Glucose, Bld: 140 mg/dL — ABNORMAL HIGH (ref 65–99)
Potassium: 5.5 mmol/L — ABNORMAL HIGH (ref 3.5–5.1)
SODIUM: 138 mmol/L (ref 135–145)

## 2016-07-24 LAB — MAGNESIUM: MAGNESIUM: 1.7 mg/dL (ref 1.7–2.4)

## 2016-07-24 LAB — AMMONIA: AMMONIA: 24 umol/L (ref 9–35)

## 2016-07-24 LAB — MRSA PCR SCREENING: MRSA by PCR: NEGATIVE

## 2016-07-24 MED ORDER — LEVETIRACETAM 500 MG PO TABS
500.0000 mg | ORAL_TABLET | Freq: Two times a day (BID) | ORAL | Status: DC
  Administered 2016-07-24 – 2016-07-25 (×4): 500 mg via ORAL
  Filled 2016-07-24 (×4): qty 1

## 2016-07-24 MED ORDER — ASPIRIN EC 81 MG PO TBEC
81.0000 mg | DELAYED_RELEASE_TABLET | Freq: Every day | ORAL | Status: DC
  Administered 2016-07-24 – 2016-07-29 (×3): 81 mg via ORAL
  Filled 2016-07-24 (×3): qty 1

## 2016-07-24 MED ORDER — LOSARTAN POTASSIUM 50 MG PO TABS
100.0000 mg | ORAL_TABLET | Freq: Every day | ORAL | Status: DC
  Administered 2016-07-24: 100 mg via ORAL
  Filled 2016-07-24: qty 2

## 2016-07-24 MED ORDER — CARVEDILOL 3.125 MG PO TABS
3.1250 mg | ORAL_TABLET | Freq: Two times a day (BID) | ORAL | Status: DC
  Administered 2016-07-24: 3.125 mg via ORAL
  Filled 2016-07-24 (×2): qty 1

## 2016-07-24 MED ORDER — ATORVASTATIN CALCIUM 20 MG PO TABS: 40.0000 mg | ORAL_TABLET | Freq: Every day | ORAL | Status: DC

## 2016-07-24 MED ORDER — HEPARIN SODIUM (PORCINE) 5000 UNIT/ML IJ SOLN
5000.0000 [IU] | Freq: Three times a day (TID) | INTRAMUSCULAR | Status: DC
  Administered 2016-07-24 – 2016-07-29 (×13): 5000 [IU] via SUBCUTANEOUS
  Filled 2016-07-24 (×15): qty 1

## 2016-07-24 MED ORDER — NITROGLYCERIN 2 % TD OINT
0.5000 [in_us] | TOPICAL_OINTMENT | Freq: Four times a day (QID) | TRANSDERMAL | Status: DC
  Administered 2016-07-24 (×2): 0.5 [in_us] via TOPICAL
  Filled 2016-07-24 (×2): qty 1

## 2016-07-24 MED ORDER — CYCLOBENZAPRINE HCL 10 MG PO TABS: 5.0000 mg | ORAL_TABLET | Freq: Three times a day (TID) | ORAL | Status: DC | PRN

## 2016-07-24 MED ORDER — MELATONIN 5 MG PO TABS
2.5000 mg | ORAL_TABLET | Freq: Every day | ORAL | Status: DC
  Administered 2016-07-24: 2.5 mg via ORAL
  Filled 2016-07-24 (×4): qty 0.5

## 2016-07-24 MED ORDER — ATROPINE SULFATE 1 MG/10ML IJ SOSY
PREFILLED_SYRINGE | INTRAMUSCULAR | Status: AC
  Filled 2016-07-24: qty 10

## 2016-07-24 MED ORDER — PANTOPRAZOLE SODIUM 40 MG PO TBEC
40.0000 mg | DELAYED_RELEASE_TABLET | Freq: Every day | ORAL | Status: DC
  Administered 2016-07-24 – 2016-07-29 (×3): 40 mg via ORAL
  Filled 2016-07-24 (×3): qty 1

## 2016-07-24 MED ORDER — IPRATROPIUM-ALBUTEROL 0.5-2.5 (3) MG/3ML IN SOLN: 3.0000 mL | Freq: Four times a day (QID) | RESPIRATORY_TRACT | Status: DC | PRN

## 2016-07-24 MED ORDER — ONDANSETRON HCL 4 MG/2ML IJ SOLN
4.0000 mg | Freq: Four times a day (QID) | INTRAMUSCULAR | Status: DC | PRN
  Administered 2016-07-24 – 2016-07-29 (×2): 4 mg via INTRAVENOUS
  Filled 2016-07-24 (×2): qty 2

## 2016-07-24 MED ORDER — DEXTROMETHORPHAN POLISTIREX ER 30 MG/5ML PO SUER
30.0000 mg | Freq: Two times a day (BID) | ORAL | Status: DC
  Administered 2016-07-24 – 2016-07-29 (×5): 30 mg via ORAL
  Filled 2016-07-24 (×12): qty 5

## 2016-07-24 MED ORDER — VANCOMYCIN HCL IN DEXTROSE 1-5 GM/200ML-% IV SOLN
1000.0000 mg | Freq: Once | INTRAVENOUS | Status: DC
  Filled 2016-07-24: qty 200

## 2016-07-24 MED ORDER — OXYCODONE HCL 5 MG PO TABS: 5.0000 mg | ORAL_TABLET | Freq: Four times a day (QID) | ORAL | Status: DC | PRN

## 2016-07-24 MED ORDER — CEFEPIME HCL 2 G IJ SOLR
2.0000 g | Freq: Once | INTRAMUSCULAR | Status: AC
  Administered 2016-07-24: 2 g via INTRAVENOUS
  Filled 2016-07-24 (×2): qty 2

## 2016-07-24 MED ORDER — MORPHINE SULFATE (PF) 2 MG/ML IV SOLN: 1.0000 mg | INTRAVENOUS | Status: DC | PRN

## 2016-07-24 MED ORDER — VANCOMYCIN HCL IN DEXTROSE 750-5 MG/150ML-% IV SOLN: 750.0000 mg | INTRAVENOUS | Status: DC | PRN

## 2016-07-24 MED ORDER — DM-GUAIFENESIN ER 30-600 MG PO TB12: 1.0000 | ORAL_TABLET | Freq: Two times a day (BID) | ORAL | Status: DC

## 2016-07-24 MED ORDER — CLOPIDOGREL BISULFATE 75 MG PO TABS
75.0000 mg | ORAL_TABLET | Freq: Every day | ORAL | Status: DC
  Administered 2016-07-24 – 2016-07-29 (×3): 75 mg via ORAL
  Filled 2016-07-24 (×3): qty 1

## 2016-07-24 MED ORDER — RENA-VITE PO TABS
1.0000 | ORAL_TABLET | Freq: Every day | ORAL | Status: DC
  Administered 2016-07-24 – 2016-07-28 (×2): 1 via ORAL
  Filled 2016-07-24 (×2): qty 1

## 2016-07-24 MED ORDER — MELATONIN 3 MG PO TABS: 3.0000 mg | ORAL_TABLET | Freq: Every day | ORAL | Status: DC

## 2016-07-24 MED ORDER — NITROGLYCERIN 0.4 MG SL SUBL: 0.4000 mg | SUBLINGUAL_TABLET | SUBLINGUAL | Status: DC | PRN

## 2016-07-24 MED ORDER — ALLOPURINOL 100 MG PO TABS
100.0000 mg | ORAL_TABLET | Freq: Every day | ORAL | Status: DC
  Administered 2016-07-24 – 2016-07-29 (×3): 100 mg via ORAL
  Filled 2016-07-24 (×3): qty 1

## 2016-07-24 MED ORDER — B COMPLEX-C PO TABS
1.0000 | ORAL_TABLET | Freq: Every day | ORAL | Status: DC
Start: 2016-07-24 — End: 2016-07-29
  Administered 2016-07-24 – 2016-07-29 (×3): 1 via ORAL
  Filled 2016-07-24 (×7): qty 1

## 2016-07-24 MED ORDER — DEXTROSE 5 % IV SOLN: 1.0000 g | Freq: Every day | INTRAVENOUS | Status: DC

## 2016-07-24 MED ORDER — MIRTAZAPINE 15 MG PO TABS
7.5000 mg | ORAL_TABLET | Freq: Every day | ORAL | Status: DC
  Administered 2016-07-24 – 2016-07-28 (×2): 7.5 mg via ORAL
  Filled 2016-07-24 (×2): qty 1

## 2016-07-24 MED ORDER — GUAIFENESIN ER 600 MG PO TB12
600.0000 mg | ORAL_TABLET | Freq: Two times a day (BID) | ORAL | Status: DC
  Administered 2016-07-24 – 2016-07-29 (×5): 600 mg via ORAL
  Filled 2016-07-24 (×6): qty 1

## 2016-07-24 MED ORDER — INSULIN ASPART 100 UNIT/ML ~~LOC~~ SOLN
0.0000 [IU] | Freq: Three times a day (TID) | SUBCUTANEOUS | Status: DC
  Administered 2016-07-24 – 2016-07-25 (×3): 1 [IU] via SUBCUTANEOUS
  Administered 2016-07-28 – 2016-07-29 (×2): 2 [IU] via SUBCUTANEOUS
  Filled 2016-07-24: qty 2
  Filled 2016-07-24 (×2): qty 1
  Filled 2016-07-24: qty 2
  Filled 2016-07-24: qty 1
  Filled 2016-07-24: qty 2

## 2016-07-24 NOTE — H&P (Addendum)
History and Physical   [facility] -  @ [facility] Admission History and Physical [employer], D.O.    Patient Name: [name] MR#: [id number] Date of Birth: 12-29-1950 Date of Admission: 07/23/2016  Referring MD/NP/PA: Dr. Clearnce Hasten Primary Care Physician: [name], MD Outpatient Specialists: Dr. Juleen China, Dr. [name],  Patient coming from: Home  Chief Complaint: Back pain  HPI: [name] is a 66 y.o. male with a known history of chronic diastolic congestive heart failure, end-stage renal disease on hemodialysis status post failed kidney transplant, hypertension, hyperlipidemia, anemia of chronic disease, seizure disorder, gout and type 2 diabetes was in a usual state of health until this morning when he developed right-sided thoracic pain which is described as sharp, localized, worse with movement and better with rest. He denies any trauma, new exercise.  Of note patient has had a complicated recent course including a, NSTEMI, left heart catheterization, coronary artery bypass grafting and hospitalization at [facility]. He presented to [facility] for chest pain in Sept 2017, was transferred to [facility] where he had bypass surgery on 02/28/2016. His postop course was complicated by seizures. He was transferred to rehabilitation on 03/21/2016. He was seen again in our emergency department in November for syncope and admitted to [facility] again in early January for bradycardia. His metoprolol was discontinued on discharge.   Patient denies fevers/chills, weakness, dizziness, chest pain, shortness of breath, N/V/C/D, abdominal pain, dysuria/frequency, changes in mental status.   ED Course: Patient received Maxipime and vancomycin, oxycodone and morphine.  Review of Systems:  CONSTITUTIONAL: No fever/chills, fatigue, weakness, weight gain/loss, headache. EYES: No blurry or double vision. ENT: No tinnitus, postnasal drip, redness or soreness of the oropharynx. RESPIRATORY: No cough,  dyspnea, wheeze.  No hemoptysis.  CARDIOVASCULAR: No chest pain, palpitations, syncope, orthopnea. No lower extremity edema.  GASTROINTESTINAL: No nausea, vomiting, abdominal pain, diarrhea, constipation.  No hematemesis, melena or hematochezia. GENITOURINARY: No dysuria, frequency, hematuria. ENDOCRINE: No polyuria or nocturia. No heat or cold intolerance. HEMATOLOGY: No anemia, bruising, bleeding. INTEGUMENTARY: No rashes, ulcers, lesions. MUSCULOSKELETAL: No arthritis, gout, dyspnea. Positive right-sided thoracic back pain NEUROLOGIC: No numbness, tingling, ataxia, seizure-type activity, weakness. PSYCHIATRIC: No anxiety, depression, insomnia.   Past Medical History:  Diagnosis Date  . Chronic diastolic congestive heart failure ([place])   . Chronic disease anemia   . ESRD (end stage renal disease) on dialysis ([place])    "[place]; [place]; [name]" (09/29/2014)  . GERD (gastroesophageal reflux disease)   . Gout   . High cholesterol   . History of blood transfusion    "related to anemia"  . History of stomach ulcers   . Hypertension   . Type II diabetes mellitus ([place])     Past Surgical History:  Procedure Laterality Date  . ARTERIOVENOUS GRAFT PLACEMENT Left ~ 1996  . CARDIAC CATHETERIZATION  09/29/2014   "[place]"  . CARDIAC CATHETERIZATION N/A 02/26/2016   Procedure: Left Heart Cath and Coronary Angiography Possible PCI;  Surgeon: [name], MD;  Location: [place] CV LAB;  Service: Cardiovascular;  Laterality: N/A;  . KIDNEY TRANSPLANT Right 2004  . NEPHRECTOMY TRANSPLANTED ORGAN  2015  . PERCUTANEOUS CORONARY STENT INTERVENTION (PCI-S) N/A 09/30/2014   Procedure: PERCUTANEOUS CORONARY STENT INTERVENTION (PCI-S);  Surgeon: [name], MD;  Location: [facility] CATH LAB;  Service: Cardiovascular;  Laterality: N/A;  . PERITONEAL CATHETER INSERTION  02/2014  . PERITONEAL CATHETER REMOVAL  08/2014  . THROMBECTOMY / ARTERIOVENOUS GRAFT REVISION  2015     reports that he has  quit  smoking. His smoking use included Cigarettes. He has never used smokeless tobacco. He reports that he does not drink alcohol or use drugs.  Allergies  Allergen Reactions  . Ivp Dye [Iodinated Diagnostic Agents] Other (See Comments)    Pt denied    Family History  Problem Relation Age of Onset  . Hypertension Mother   . Stroke Mother   . Hypertension Father   . Diabetes Mellitus II Father   . Asthma Father    Family history has been reviewed and confirmed with patient.   Prior to Admission medications   Medication Sig Start Date End Date Taking? Authorizing Provider  acetaminophen (TYLENOL) 325 MG tablet Take 2 tablets (650 mg total) by mouth every 6 (six) hours as needed for mild pain (or Fever >/= 101). 02/27/16  Yes [name], MD  albuterol (PROVENTIL) (2.5 MG/3ML) 0.083% nebulizer solution Take 2.5 mg by nebulization every 6 (six) hours as needed for wheezing or shortness of breath.   Yes Historical Provider, MD  allopurinol (ZYLOPRIM) 100 MG tablet Take 100 mg by mouth daily. 09/23/14  Yes Historical Provider, MD  aspirin 81 MG EC tablet Take 81 mg by mouth daily. 09/23/14  Yes Historical Provider, MD  atorvastatin (LIPITOR) 40 MG tablet Take 1 tablet (40 mg total) by mouth daily at 6 PM. 11/09/14  Yes [name], MD  B Complex-C (B-COMPLEX WITH VITAMIN C) tablet Take 1 tablet by mouth daily.   Yes Historical Provider, MD  clopidogrel (PLAVIX) 75 MG tablet Take 1 tablet (75 mg total) by mouth daily with breakfast. 10/02/14  Yes [name], MD  epoetin alfa (EPOGEN,PROCRIT) 4000 UNIT/ML injection Inject 4,000 Units into the vein.   Yes Historical Provider, MD  heparin 100-0.45 UNIT/ML-% infusion Inject 1,000 Units/hr into the vein continuous. 02/27/16  Yes [name], MD  levETIRAcetam (KEPPRA) 500 MG tablet Take 500 mg by mouth every 12 (twelve) hours.   Yes Historical Provider, MD  Melatonin 3 MG TABS Take 3 mg by mouth at bedtime.   Yes Historical Provider, MD   mirtazapine (REMERON) 7.5 MG tablet Take 7.5 mg by mouth at bedtime.   Yes Historical Provider, MD  multivitamin (RENA-VIT) TABS tablet Take 1 tablet by mouth at bedtime. 10/02/14  Yes [name], MD  nitroGLYCERIN (NITROGLYN) 2 % ointment Apply 0.5 inches topically every 6 (six) hours. 02/27/16  Yes [name], MD  nitroGLYCERIN (NITROSTAT) 0.4 MG SL tablet Place 1 tablet (0.4 mg total) under the tongue every 5 (five) minutes x 3 doses as needed for chest pain. 10/02/14  Yes [name], MD  pantoprazole (PROTONIX) 40 MG tablet Take 40 mg by mouth daily. 01/17/14  Yes Historical Provider, MD  carvedilol (COREG) 3.125 MG tablet Take 3.125 mg by mouth 2 (two) times daily with a meal.    Historical Provider, MD  losartan (COZAAR) 100 MG tablet Take 1 tablet (100 mg total) by mouth daily. Patient not taking: Reported on 07/24/2016 11/09/14   [name], MD    Physical Exam: Vitals:   07/23/16 2142 07/23/16 2230 07/23/16 2300 07/23/16 2330  BP: 120/78 125/84 116/71 137/83  Pulse: 83 (!) 48  94  Resp: (!) 22 (!) 26 (!) 23 (!) 23  Temp: 98 F (36.7 C)     TempSrc: Oral     SpO2: 96% 98%  100%  Weight: 63.5 kg (140 lb)     Height: 5\' 4"  (1.626 m)       GENERAL: 66 y.o.-year-old [place] male patient,  well-developed, well-nourished lying in the bed in no acute distress.  Pleasant and cooperative.   HEENT: Head atraumatic, normocephalic. Pupils equal, round, reactive to light and accommodation. No scleral icterus. Extraocular muscles intact. Nares are patent. Oropharynx is clear. Mucus membranes moist. NECK: Supple, full range of motion. No JVD, no bruit heard. No thyroid enlargement, no tenderness, no cervical lymphadenopathy. CHEST: Normal breath sounds bilaterally. No wheezing, rales, rhonchi or crackles. No use of accessory muscles of respiration.  No reproducible chest wall tenderness.  CARDIOVASCULAR: S1, S2 normal. No murmurs, rubs, or gallops. Cap refill <2 seconds. Pulses intact  distally.  ABDOMEN: Soft, nondistended, nontender. No rebound, guarding, rigidity. Normoactive bowel sounds present in all four quadrants. No organomegaly or mass. SPINE: Patient has mild paravertebral muscular spasm over the right mid thoracic region. No midline tenderness, no edema, erythema. EXTREMITIES: No pedal edema, cyanosis, or clubbing. No calf tenderness or Homan's sign.  NEUROLOGIC: The patient is alert and oriented x 3. Cranial nerves II through XII are grossly intact with no focal sensorimotor deficit. Muscle strength 5/5 in all extremities. Sensation intact. Gait not checked. PSYCHIATRIC:  Normal affect, mood, thought content. SKIN: Warm, dry, and intact without obvious rash, lesion, or ulcer.    Labs on Admission:  CBC:  Recent Labs Lab 07/23/16 2156  WBC 14.6*  NEUTROABS 12.6*  HGB 13.8  HCT 43.6  MCV 74.4*  PLT 629   Basic Metabolic Panel:  Recent Labs Lab 07/18/16 0700 07/23/16 2156  NA  --  138  K 3.5 5.3*  CL  --  93*  CO2  --  28  GLUCOSE  --  180*  BUN  --  46*  CREATININE  --  9.43*  CALCIUM  --  8.1*   GFR: Estimated Creatinine Clearance: 6.5 mL/min (by C-G formula based on SCr of 9.43 mg/dL (H)). Liver Function Tests: No results for input(s): AST, ALT, ALKPHOS, BILITOT, PROT, ALBUMIN in the last 168 hours. No results for input(s): LIPASE, AMYLASE in the last 168 hours. No results for input(s): AMMONIA in the last 168 hours. Coagulation Profile: No results for input(s): INR, PROTIME in the last 168 hours. Cardiac Enzymes:  Recent Labs Lab 07/23/16 2156  TROPONINI 0.03*   BNP (last 3 results) No results for input(s): PROBNP in the last 8760 hours. HbA1C: No results for input(s): HGBA1C in the last 72 hours. CBG: No results for input(s): GLUCAP in the last 168 hours. Lipid Profile: No results for input(s): CHOL, HDL, LDLCALC, TRIG, CHOLHDL, LDLDIRECT in the last 72 hours. Thyroid Function Tests: No results for input(s): TSH, T4TOTAL,  FREET4, T3FREE, THYROIDAB in the last 72 hours. Anemia Panel: No results for input(s): VITAMINB12, FOLATE, FERRITIN, TIBC, IRON, RETICCTPCT in the last 72 hours.  Sepsis Labs: @LABRCNTIP (procalcitonin:4,lacticidven:4) )No results found for this or any previous visit (from the past 240 hour(s)).   Radiological Exams on Admission: Dg Chest 1 View  Result Date: 07/23/2016 CLINICAL DATA:  Back pain.  History of CHF, hypertension. EXAM: CHEST 1 VIEW COMPARISON:  02/26/2016 FINDINGS: Postoperative changes in the mediastinum. Vascular graft in the left upper extremity and axilla. Shallow inspiration. Mild cardiac enlargement without vascular congestion. There is infiltration or atelectasis in the left lung base behind the heart with possible small left pleural effusion. This may indicate an pneumonia. Right lung is clear. No pneumothorax. IMPRESSION: Infiltration in the left lung base with probable effusion suggesting pneumonia. Followup PA and lateral chest X-ray is recommended in 3-4 weeks following appropriate clinical therapy  to ensure resolution and exclude underlying malignancy. Electronically Signed   By: Lucienne Capers M.D.   On: 07/23/2016 22:13    EKG: Normal sinus rhythm at 82 bpm with normal axis and nonspecific ST-T wave changes.   Assessment/Plan  This is a 66 y.o. male with a history of chronic diastolic congestive heart failure, end-stage renal disease on hemodialysis status post failed kidney transplant, hypertension, hyperlipidemia, anemia of chronic disease, seizure disorder, gout and type 2 diabetes now being admitted with:  1. Healthcare Associated Pneumonia, this is an incidental finding however patient has been tachycardic, tachypnea in the emergency department - Admit to inpatient - IV Maxipime & Vancomycin - Duonebs, expectorants & O2 therapy as needed - Follow up blood & sputum cultures  2. Back pain, musculoskeletal in nature -We'll check a thoracic spine x-ray to  rule out any bony abnormalities - PT eval  3. ESRD on HD -Nephrology consult for continued dialysis -Continue multivitamin  4. History of chronic diastolic heart failure -Continue Coreg, nitroglycerin, losartan  5. History of coronary artery disease status post MI -Continue aspirin, Plavix, Coreg, nitroglycerin, Lipitor  6. History of GERD -Continue Protonix  7. History of seizures -Continue Keppra  8. Elevated troponins, mild and likely secondary to end-stage renal disease -Trend troponins  Admission status: Inpatient IV Fluids: HL Diet/Nutrition: Renal Consults called: [place] DVT Px: Heparin, SCDs and early ambulation. Code Status: Full Code  Disposition Plan: To home in 1-2 days   All the records are reviewed and case discussed with ED provider. Management plans discussed with the patient and/or family who express understanding and agree with plan of care.  Vickii Volland D.O. on 07/24/2016 at 12:33 AM Between 7am to 6pm - Pager - [phone] After 6pm go to [email or web address] - [employer] [facility] [place] [facility] Office [phone] CC: Primary care physician; [name], MD   07/24/2016, 12:33 AM

## 2016-07-24 NOTE — Consult Note (Signed)
[facility] responded to rapid response page; Register not needed at present: page when family arrive if needed or for MD update.5:29 PM [name]

## 2016-07-24 NOTE — ED Notes (Signed)
Patient currently still only has a thumb IV. Another RN attempting ultrasound guided IV. Patient has currently had 5-6 IV attempts by various nurses.

## 2016-07-24 NOTE — Progress Notes (Signed)
Called to evaluate pt. Has he was noted to be hypotensive, Bradycardic and altered and transferred to the ICU.    Vitals:   07/24/16 0747 07/24/16 1648  BP: 99/68 (!) 70/36  Pulse: 98   Resp: 20   Temp: 98.4 F (36.9 C)    Physical Exam:  HEENT - at, [place], eomi, perrl Heart - S1, S2 no murmurs, rubs clicks Lungs - CTA b/l no rales, rhonchi, wheezes Abd - soft, NT, ND, + BS no organomegaly Ext - no cyanosis, clubbing, edema b/l.  Left upper ext. AV fistula with good Bruit, Thrill.  Neuro - AAO X 1.  Does not follow commands.    ABG    Component Value Date/Time   PHART 7.43 07/24/2016 1711   PCO2ART 35 07/24/2016 1711   PO2ART 60 (L) 07/24/2016 1711   HCO3 23.2 07/24/2016 1711   ACIDBASEDEF 0.7 07/24/2016 1711   O2SAT 91.3 07/24/2016 1711   Potassium - 5.3 this a.m.  CBG - Hg & HCT stable.   Assessment & Plan  66 yo male w/ hx of ESRD on HD, DM, HTN, hx of PUD who was admitted with right sided chest pain, Back pain and noted to have HCAP. Now noted to be hypotensive, Bradycardic.   1. Bradycardia, hypotension - etiology unclear presently.  Pt. Has been bradycardic before and recently taken off Metoprolol. Will get Cardiology consult.  Repeat POtassium Mg. Level.  - if needed consider starting on low dose Dopamine.  - Transcutaneous pacer pads placed.  - empirically already on Abx for treating HCAP.   2. AMS - metabolic encephalopathy due to Hypotension, bradycardia.  - ABG showing no Hypercarbia.  Does have abnormal LfT's and will check Ammonia Level.  - avoid Sedative, Deliorgenic meds.    Consult Intensivist to manage Hypotension and likely need to IV Access.    Critical Care time Spent:  40 min.

## 2016-07-24 NOTE — ED Notes (Signed)
2 unsuccessful US-guided PIV attempts by this RN (right forearm and right AC).

## 2016-07-24 NOTE — Progress Notes (Signed)
07/24/2016  5:16 PM  Went into pt's room to give dose of insulin because of CBG 194 at 16:21.  Pt was lethargic, minimally responsive requiring sternal rub to get verbal response.  Pt only groaned.  Did not speak as he had earlier in the day.  Sp02 on room air measured 95%, respirations difficult to count due to uneven shallow pattern.  BP68/44 via dinamap.  Verified manual 70/36 by RN Rochel Brome.  Pulse 41bpm.  Called rapid response.  Rapid response nurse began 250cc NS bolus.  Rapid response nurse Staci uncomfortable waiting for attending physciain, and transferred to CCU.  Gave bedside report to receiving RN Romie Minus at time of arrival.  Dola Argyle, RN

## 2016-07-24 NOTE — Evaluation (Signed)
Physical Therapy Evaluation Patient Details Name: [name] MRN: [id number] DOB: 07/07/1950 Today's Date: 07/24/2016   History of Present Illness  Pt admitted for HCAP. Pt with history of ESRD on HD, CHF, DM, NSTEMI, and seizures. Pt also complained of thoracic back pain with movement  Clinical Impression  Pt is a pleasant 66 year old male who was admitted for HCAP. Pt performs bed mobility, transfers, and ambulation with min assist however able to progress to cga with mobility. Pt performed 5 time sit<>Stand test in 18 seconds, with use of RW demonstrating decreased power/endurance at this time. Pt demonstrates deficits with strength/mobility/endurance. Pt appears motivated to perform mobility. Would benefit from skilled PT to address above deficits and promote optimal return to PLOF. Recommend transition to [place] upon discharge from acute hospitalization.       Follow Up Recommendations Home health PT;Supervision for mobility/OOB    Equipment Recommendations   (has RW)    Recommendations for Other Services       Precautions / Restrictions Precautions Precautions: Fall Restrictions Weight Bearing Restrictions: No      Mobility  Bed Mobility Overal bed mobility: Needs Assistance Bed Mobility: Supine to Sit     Supine to sit: Min assist     General bed mobility comments: Pt slightly impulsive and tries to perform OOB mobility prior to therapist instructions. Needs min assist for scooting out towards EOB and upright posture as he tends to lean towards R side. After a few minutes, pt able to sit with upright posture with supervision  Transfers Overall transfer level: Needs assistance Equipment used: Rolling walker (2 wheeled) Transfers: Sit to/from Stand Sit to Stand: Min assist         General transfer comment: Pt performed several attempts at sit<>Stand with RW and upright posture. Pt tries to pull from RW during transfer. WIth increased reps, pt able to perform  transfer with only cga  Ambulation/Gait Ambulation/Gait assistance: Min assist Ambulation Distance (Feet): 20 Feet Assistive device: Rolling walker (2 wheeled) Gait Pattern/deviations: Step-to pattern     General Gait Details: ambulated to recliner with min assist and heavy cues for sequencing. Pt able to ambulate to recliner, requiring seated rest break. Further ambulation performed with cga to door with slow step to gait pattern. Pt then agreeable to ambulate further in hallway, noted in flow sheet  Stairs            Wheelchair Mobility    Modified Rankin (Stroke Patients Only)       Balance Overall balance assessment: Needs assistance Sitting-balance support: Feet supported Sitting balance-Leahy Scale: Fair     Standing balance support: Bilateral upper extremity supported Standing balance-Leahy Scale: Fair                               Pertinent Vitals/Pain Pain Assessment: No/denies pain    Home Living Family/patient expects to be discharged to:: Private residence Living Arrangements: Spouse/significant other Available Help at Discharge: Family;Available 24 hours/day Type of Home: House Home Access: Stairs to enter Entrance Stairs-Rails: Can reach both Entrance Stairs-Number of Steps: 3 Home Layout: One level Home Equipment: Walker - 2 wheels      Prior Function Level of Independence: Independent         Comments: pt reports he was ambulatory without AD, however has used RW in past     Hand Dominance        Extremity/Trunk Assessment   Upper  Extremity Assessment Upper Extremity Assessment: Overall WFL for tasks assessed    Lower Extremity Assessment Lower Extremity Assessment: Generalized weakness (B LE grossly 4/5)       Communication   Communication: No difficulties  Cognition Arousal/Alertness: Awake/alert Behavior During Therapy: WFL for tasks assessed/performed Overall Cognitive Status: Within Functional Limits for  tasks assessed                      General Comments      Exercises Other Exercises Other Exercises: Pt ambulated in hallway for 60' with reciprocal gait pattern and upright posture. Chair follow required for safety. WIth increased distance, pt with improved technique and balance. Pt fatigues and request seated rest break.   Assessment/Plan    PT Assessment Patient needs continued PT services  PT Problem List Decreased strength;Decreased activity tolerance;Decreased balance;Decreased mobility;Decreased safety awareness          PT Treatment Interventions DME instruction;Gait training;Therapeutic exercise    PT Goals (Current goals can be found in the Care Plan section)  Acute Rehab PT Goals Patient Stated Goal: to walk more PT Goal Formulation: With patient Time For Goal Achievement: 08/07/16 Potential to Achieve Goals: Good    Frequency Min 2X/week   Barriers to discharge        Co-evaluation               End of Session Equipment Utilized During Treatment: Gait belt Activity Tolerance: Patient tolerated treatment well Patient left: in chair;with chair alarm set Nurse Communication: Mobility status         Time: 1610-9604 PT Time Calculation (min) (ACUTE ONLY): 24 min   Charges:   PT Evaluation $PT Eval Moderate Complexity: 1 Procedure PT Treatments $Gait Training: 8-22 mins   PT G Codes:        Vasiliy Mccarry 2016/08/10, 12:35 PM  Greggory Stallion, PT, DPT [phone]

## 2016-07-24 NOTE — Progress Notes (Signed)
Inpatient Diabetes Program Recommendations  AACE/ADA: New Consensus Statement on Inpatient Glycemic Control (2015)  Target Ranges:  Prepandial:   less than 140 mg/dL      Peak postprandial:   less than 180 mg/dL (1-2 hours)      Critically ill patients:  140 - 180 mg/dL   Results for GAIGE, SEBO (MRN [id number]) as of 07/24/2016 09:59  Ref. Range 07/23/2016 21:56 07/24/2016 08:06  Glucose Latest Ref Range: 65 - 99 mg/dL 180 (H) 169 (H)   Review of Glycemic Control  Diabetes history: DM2 Outpatient Diabetes medications: None Current orders for Inpatient glycemic control: None  Inpatient Diabetes Program Recommendations: Correction (SSI): While inpatient, please consider ordering CBGs with Novolog correction scale ACHS.  Thanks, Barnie Alderman, RN, MSN, CDE Diabetes Coordinator Inpatient Diabetes Program [phone] (Team Pager from 8am to 5pm)

## 2016-07-24 NOTE — Progress Notes (Signed)
Pharmacy Antibiotic Note  [name] is a 66 y.o. male admitted on 07/23/2016 with pneumonia.  Pharmacy has been consulted for vancomycin and cefepime dosing.  Plan: HD patient Vancomycin 1500 mg x1 loading dose ordered. 750 mg with every HD. Level will need to be ordered when HD is scheduled.  Cefepime 1 gram q 24 hours ordered  Height: 5\' 4"  (162.6 cm) Weight: 140 lb (63.5 kg) IBW/kg (Calculated) : 59.2  Temp (24hrs), Avg:98.2 F (36.8 C), Min:98 F (36.7 C), Max:98.3 F (36.8 C)   Recent Labs Lab 07/23/16 2156  WBC 14.6*  CREATININE 9.43*    Estimated Creatinine Clearance: 6.5 mL/min (by C-G formula based on SCr of 9.43 mg/dL (H)).    Allergies  Allergen Reactions  . Ivp Dye [Iodinated Diagnostic Agents] Other (See Comments)    Pt denied    Antimicrobials this admission: vancomycin 2/7 >>  cefepime 2/7 >>   Dose adjustments this admission:   Microbiology results: No micro   2/6 CXR: L base infiltrate  Thank you for allowing pharmacy to be a part of this patient's care.  Vira Chaplin S 07/24/2016 3:05 AM

## 2016-07-24 NOTE — ED Notes (Signed)
Spoke to admitting MD (Dr. Ara Kussmaul). Ok to wait until am for PICC line placement for Vanc administration d/t difficult IV stick and multiple unsuccessful attempts.

## 2016-07-24 NOTE — Progress Notes (Signed)
18:25 pt. Heart rate 45-50. BP map 60. Pt. Unable to respond to questions. Moaning in pain after a sternal rub. At 18:30 pt heart rate suddenly increased to 90. Blood pressure MAP 75. Pt. Now alert and oriented. NP was in the room when this occurred.

## 2016-07-24 NOTE — Progress Notes (Signed)
[facility]  ROUNDING NOTE   Subjective:   [name] admitted to [facility] on 07/23/2016 for Hospital-acquired pneumonia [J18.9] Thoracic back pain, unspecified back pain laterality, unspecified chronicity [M54.6]  Last hemodialysis was yesterday.   Objective:  Vital signs in last 24 hours:  Temp:  [98 F (36.7 C)-98.4 F (36.9 C)] 98.4 F (36.9 C) (02/07 0747) Pulse Rate:  [48-107] 98 (02/07 0747) Resp:  [20-26] 20 (02/07 0747) BP: (97-137)/(63-87) 99/68 (02/07 0747) SpO2:  [96 %-100 %] 97 % (02/07 0747) Weight:  [63.5 kg (140 lb)] 63.5 kg (140 lb) (02/06 2142)  Weight change:  Filed Weights   07/23/16 2142  Weight: 63.5 kg (140 lb)    Intake/Output: I/O last 3 completed shifts: In: 95 [IV Piggyback:95] Out: 0    Intake/Output this shift:  No intake/output data recorded.  Physical Exam: General: NAD,   Head: Normocephalic, atraumatic. Moist oral mucosal membranes  Eyes: Anicteric, PERRL  Neck: Supple, trachea midline  Lungs:  Clear to auscultation  Heart: Regular rate and rhythm  Abdomen:  Soft, nontender,   Extremities: no peripheral edema.  Neurologic: Nonfocal, moving all four extremities  Skin: No lesions  Access: Left forearm AVF    Basic Metabolic Panel:  Recent Labs Lab 07/18/16 0700 07/23/16 2156 07/24/16 0806  NA  --  138 138  K 3.5 5.3* 5.3*  CL  --  93* 95*  CO2  --  28 23  GLUCOSE  --  180* 169*  BUN  --  46* 54*  CREATININE  --  9.43* 10.42*  CALCIUM  --  8.1* 8.2*    Liver Function Tests:  Recent Labs Lab 07/24/16 0806  AST 575*  ALT 414*  ALKPHOS 347*  BILITOT 3.7*  PROT 8.9*  ALBUMIN 4.0   No results for input(s): LIPASE, AMYLASE in the last 168 hours. No results for input(s): AMMONIA in the last 168 hours.  CBC:  Recent Labs Lab 07/23/16 2156 07/24/16 0806  WBC 14.6* 20.1*  NEUTROABS 12.6* 18.1*  HGB 13.8 13.2  HCT 43.6 41.5  MCV 74.4* 74.9*  PLT 285 250    Cardiac Enzymes:  Recent  Labs Lab 07/23/16 2156 07/24/16 0244 07/24/16 0806  TROPONINI 0.03* 0.04* 0.06*    BNP: Invalid input(s): POCBNP  CBG:  Recent Labs Lab 07/24/16 [place] 149*    Microbiology: Results for orders placed or performed during the hospital encounter of 07/23/16  Culture, blood (routine x 2) Call MD if unable to obtain prior to antibiotics being given     Status: None (Preliminary result)   Collection Time: 07/24/16  2:44 AM  Result Value Ref Range Status   Specimen Description BLOOD RIGHT HAND  Final   Special Requests BOTTLES DRAWN AEROBIC AND ANAEROBIC BCAV  Final   Culture NO GROWTH < 12 HOURS  Final   Report Status PENDING  Incomplete  MRSA PCR Screening     Status: None   Collection Time: 07/24/16  4:17 AM  Result Value Ref Range Status   MRSA by PCR NEGATIVE NEGATIVE Final    Comment:        The GeneXpert MRSA Assay (FDA approved for NASAL specimens only), is one component of a comprehensive MRSA colonization surveillance program. It is not intended to diagnose MRSA infection nor to guide or monitor treatment for MRSA infections.     Coagulation Studies: No results for input(s): LABPROT, INR in the last 72 hours.  Urinalysis: No results for input(s): COLORURINE,  LABSPEC, PHURINE, GLUCOSEU, HGBUR, BILIRUBINUR, KETONESUR, PROTEINUR, UROBILINOGEN, NITRITE, LEUKOCYTESUR in the last 72 hours.  Invalid input(s): APPERANCEUR    Imaging: Dg Chest 1 View  Result Date: 07/23/2016 CLINICAL DATA:  Back pain.  History of CHF, hypertension. EXAM: CHEST 1 VIEW COMPARISON:  02/26/2016 FINDINGS: Postoperative changes in the mediastinum. Vascular graft in the left upper extremity and axilla. Shallow inspiration. Mild cardiac enlargement without vascular congestion. There is infiltration or atelectasis in the left lung base behind the heart with possible small left pleural effusion. This may indicate an pneumonia. Right lung is clear. No pneumothorax. IMPRESSION:  Infiltration in the left lung base with probable effusion suggesting pneumonia. Followup PA and lateral chest X-ray is recommended in 3-4 weeks following appropriate clinical therapy to ensure resolution and exclude underlying malignancy. Electronically Signed   By: Lucienne Capers M.D.   On: 07/23/2016 22:13   Dg Thoracic Spine 2 View  Result Date: 07/24/2016 CLINICAL DATA:  66 y/o  M; 2 days of upper back pain. EXAM: THORACIC SPINE 2 VIEWS COMPARISON:  02/26/2016 chest radiograph FINDINGS: There is no evidence of thoracic spine fracture. Alignment is normal. Discogenic degenerative changes of the upper lumbar spine with large marginal osteophytes. Aortic atherosclerosis with calcification. Sternotomy wires are aligned. IMPRESSION: No acute fracture or dislocation identified. Electronically Signed   By: Kristine Garbe M.D.   On: 07/24/2016 03:31     Medications:    . allopurinol  100 mg Oral Daily  . aspirin EC  81 mg Oral Daily  . atorvastatin  40 mg Oral q1800  . B-complex with vitamin C  1 tablet Oral Daily  . carvedilol  3.125 mg Oral BID WC  . [START ON 07/25/2016] ceFEPime (MAXIPIME) IV  1 g Intravenous q1800  . clopidogrel  75 mg Oral Q breakfast  . dextromethorphan  30 mg Oral BID   And  . guaiFENesin  600 mg Oral BID  . heparin  5,000 Units Subcutaneous Q8H  . insulin aspart  0-9 Units Subcutaneous TID WC  . levETIRAcetam  500 mg Oral Q12H  . losartan  100 mg Oral Daily  . Melatonin  2.5 mg Oral QHS  . mirtazapine  7.5 mg Oral QHS  . multivitamin  1 tablet Oral QHS  . nitroGLYCERIN  0.5 inch Topical Q6H  . pantoprazole  40 mg Oral Daily   cyclobenzaprine, ipratropium-albuterol, morphine injection, nitroGLYCERIN, ondansetron (ZOFRAN) IV, oxyCODONE  Assessment/ Plan:  [name] is a 66 y.o. Asian [place]) male with End stage kidney disease status post transplant now on hemodialysis., hypertension, coronary artery disease, meningitis, hyperlipidemia, gout,  diabetes mellitus type II, admitted to [facility] on 07/23/2016 for Hospital-acquired pneumonia [J18.9] Thoracic back pain, unspecified back pain laterality, unspecified chronicity [M54.6]   TTS [place]. Left AVF  1. End Stage Renal Disease: last hemodialysis was yesterday - continue TTS schedule.   2. Hypertension: hypotensive.  - currently on losartan  3. Anemia of chronic kidney disease: hemoglobin 13.2 - hold epo.   4. Secondary Hyperparathyroidism: outpatient PTH, phos and calcium at goal. Not currently on any binders.   5. Pneumonia: on empiric cefepime.    LOS: 0 [name] 2/7/201811:46 AM

## 2016-07-24 NOTE — Consult Note (Signed)
Name: [name] MRN: [id number] DOB: 1950/12/21    ADMISSION DATE:  07/23/2016 CONSULTATION DATE:  07/24/2016  Attestation Note  patient was seen and examined agree with NP, agree with assessment and plan as amended.   66 yo male with history of ESRD, chronic diastolic CHF, now admitted with bradycardia , hypotension, altered mental status. Currently the patient is awake and alert, he has no particular complaints. Currently heart rate is 77 and normotensive. Lungs CTA bilaterally, sat is 95% on RA.   --Sinus bradycardia leading to hypotension and altered mental status.  --doubt pneumonia, sat now normal, and improved with resolution of bradycardia. Doubt need for broad spectrum antibiotics, can use azithromycin or similar if necessary.  --Hold any medications which could cause bradycardia.   Marda Stalker, M.D.    REFERRING MD :  Dr. Verdell Carmine  CHIEF COMPLAINT:  Hypotension and Bradycardia  BRIEF PATIENT DESCRIPTION: This is a 66 yo male who presented to [facility] ER 02/6 with c/o back pain, tachypnea, and tachycardia and found to have HCAP.  He was transferred to ICU on 2/7 due to hypotension and bradycardia.    SIGNIFICANT EVENTS  02/6-Pt admitted to medsurg unit with HCAP  02/7-Pt transferred to ICU due to hypotension and bradycardia PCCM consulted for additional management   STUDIES:  Echo>> [place] Abd Limited RUQ>>   HISTORY OF PRESENT ILLNESS:   This is a 66 yo male with a PMH of Type II Diabetes Mellitus, HTN, Hight Cholesterol, Gout, GERD, ESRD on Hemodialysis (T-W-S), Chronic Anemia, and Chronic diastolic CHF.  He presented to [facility] ER 02/6 with c/o back pain and was found to be tachypnea and tachycardic.  CXR revealed LLL Pneumonia, therefore pt subsequently admitted to [facility] unit for treatment.  Pt transferred to ICU on 02/7 due to hypotension, bradycardia, and acute encephalopathy.  PCCM consulted for additional management of hypotension, bradycardia, and potential need of  CVL.  The patient has had a complicated recent course including a, NSTEMI, left heart catheterization, coronary artery bypass grafting and hospitalization at [facility]. He presented to [facility] for chest pain in Sept 2017, was transferred to [facility] where he had bypass surgery on 02/28/2016. His postop course was complicated by seizures. He was transferred to rehabilitation on 03/21/2016. He was seen again in our emergency department in November for syncope and admitted to [facility] again in early January for bradycardia. His metoprolol was discontinued on discharge.  PAST MEDICAL HISTORY :   has a past medical history of Chronic diastolic congestive heart failure ([place]); Chronic disease anemia; ESRD (end stage renal disease) on dialysis ([place]); GERD (gastroesophageal reflux disease); Gout; High cholesterol; History of blood transfusion; History of stomach ulcers; Hypertension; and Type II diabetes mellitus ([place]).  has a past surgical history that includes Kidney transplant (Right, 2004); Peritoneal catheter insertion (02/2014); Peritoneal catheter removal (08/2014); Arteriovenous graft placement (Left, ~ 1996); Thrombectomy / arteriovenous graft revision (2015); Nephrectomy transplanted organ (2015); Cardiac catheterization (09/29/2014); percutaneous coronary stent intervention (pci-s) (N/A, 09/30/2014); and Cardiac catheterization (N/A, 02/26/2016). Prior to Admission medications   Medication Sig Start Date End Date Taking? Authorizing Provider  acetaminophen (TYLENOL) 325 MG tablet Take 2 tablets (650 mg total) by mouth every 6 (six) hours as needed for mild pain (or Fever >/= 101). 02/27/16  Yes [name], MD  albuterol (PROVENTIL) (2.5 MG/3ML) 0.083% nebulizer solution Take 2.5 mg by nebulization every 6 (six) hours as needed for wheezing or shortness of breath.   Yes Historical Provider, MD  allopurinol (ZYLOPRIM) 100  MG tablet Take 100 mg by mouth daily. 09/23/14  Yes Historical Provider, MD  aspirin 81 MG EC  tablet Take 81 mg by mouth daily. 09/23/14  Yes Historical Provider, MD  atorvastatin (LIPITOR) 40 MG tablet Take 1 tablet (40 mg total) by mouth daily at 6 PM. 11/09/14  Yes [name], MD  B Complex-C (B-COMPLEX WITH VITAMIN C) tablet Take 1 tablet by mouth daily.   Yes Historical Provider, MD  clopidogrel (PLAVIX) 75 MG tablet Take 1 tablet (75 mg total) by mouth daily with breakfast. 10/02/14  Yes [name], MD  epoetin alfa (EPOGEN,PROCRIT) 4000 UNIT/ML injection Inject 4,000 Units into the vein.   Yes Historical Provider, MD  heparin 100-0.45 UNIT/ML-% infusion Inject 1,000 Units/hr into the vein continuous. 02/27/16  Yes [name], MD  levETIRAcetam (KEPPRA) 500 MG tablet Take 500 mg by mouth every 12 (twelve) hours.   Yes Historical Provider, MD  Melatonin 3 MG TABS Take 3 mg by mouth at bedtime.   Yes Historical Provider, MD  mirtazapine (REMERON) 7.5 MG tablet Take 7.5 mg by mouth at bedtime.   Yes Historical Provider, MD  multivitamin (RENA-VIT) TABS tablet Take 1 tablet by mouth at bedtime. 10/02/14  Yes [name], MD  nitroGLYCERIN (NITROGLYN) 2 % ointment Apply 0.5 inches topically every 6 (six) hours. 02/27/16  Yes [name], MD  nitroGLYCERIN (NITROSTAT) 0.4 MG SL tablet Place 1 tablet (0.4 mg total) under the tongue every 5 (five) minutes x 3 doses as needed for chest pain. 10/02/14  Yes [name], MD  pantoprazole (PROTONIX) 40 MG tablet Take 40 mg by mouth daily. 01/17/14  Yes Historical Provider, MD  carvedilol (COREG) 3.125 MG tablet Take 3.125 mg by mouth 2 (two) times daily with a meal.    Historical Provider, MD  losartan (COZAAR) 100 MG tablet Take 1 tablet (100 mg total) by mouth daily. Patient not taking: Reported on 07/24/2016 11/09/14   [name], MD   Allergies  Allergen Reactions  . Ivp Dye [Iodinated Diagnostic Agents] Other (See Comments)    Pt denied    FAMILY HISTORY:  family history includes Asthma in his father; Diabetes Mellitus II  in his father; Hypertension in his father and mother; Stroke in his mother. SOCIAL HISTORY:  reports that he has quit smoking. His smoking use included Cigarettes. He has never used smokeless tobacco. He reports that he does not drink alcohol or use drugs.  REVIEW OF SYSTEMS:   Unable to assess pt confused.  SUBJECTIVE:  Pt moaning and groaning currently not answering questions.    VITAL SIGNS: Temp:  [98 F (36.7 C)-98.4 F (36.9 C)] 98.2 F (36.8 C) (02/07 1712) Pulse Rate:  [45-107] 45 (02/07 1800) Resp:  [17-26] 17 (02/07 1800) BP: (70-137)/(36-87) 87/46 (02/07 1800) SpO2:  [92 %-100 %] 100 % (02/07 1800) Weight:  [63.5 kg (140 lb)] 63.5 kg (140 lb) (02/06 2142)  PHYSICAL EXAMINATION: General: acutely ill appearing male currently in no acute distress  Neuro:  Follows commands, PERRLA, intermittently confused HEENT: supple, no JVD Cardiovascular: sinus brady, s1s2, no M/R/G Lungs: clear throughout, no wheezes, rhonchi or rales, even, non labored Abdomen: +BS x4, soft, non tender, non distended Musculoskeletal: normal bulk and tone Skin: intact no rashes or lesions   Recent Labs Lab 07/18/16 0700 07/23/16 2156 07/24/16 0806  NA  --  138 138  K 3.5 5.3* 5.3*  CL  --  93* 95*  CO2  --  28 23  BUN  --  46* 54*  CREATININE  --  9.43* 10.42*  GLUCOSE  --  180* 169*    Recent Labs Lab 07/23/16 2156 07/24/16 0806  HGB 13.8 13.2  HCT 43.6 41.5  WBC 14.6* 20.1*  PLT 285 250   Dg Chest 1 View  Result Date: 07/23/2016 CLINICAL DATA:  Back pain.  History of CHF, hypertension. EXAM: CHEST 1 VIEW COMPARISON:  02/26/2016 FINDINGS: Postoperative changes in the mediastinum. Vascular graft in the left upper extremity and axilla. Shallow inspiration. Mild cardiac enlargement without vascular congestion. There is infiltration or atelectasis in the left lung base behind the heart with possible small left pleural effusion. This may indicate an pneumonia. Right lung is clear. No  pneumothorax. IMPRESSION: Infiltration in the left lung base with probable effusion suggesting pneumonia. Followup PA and lateral chest X-ray is recommended in 3-4 weeks following appropriate clinical therapy to ensure resolution and exclude underlying malignancy. Electronically Signed   By: Lucienne Capers M.D.   On: 07/23/2016 22:13   Dg Thoracic Spine 2 View  Result Date: 07/24/2016 CLINICAL DATA:  66 y/o  M; 2 days of upper back pain. EXAM: THORACIC SPINE 2 VIEWS COMPARISON:  02/26/2016 chest radiograph FINDINGS: There is no evidence of thoracic spine fracture. Alignment is normal. Discogenic degenerative changes of the upper lumbar spine with large marginal osteophytes. Aortic atherosclerosis with calcification. Sternotomy wires are aligned. IMPRESSION: No acute fracture or dislocation identified. Electronically Signed   By: Kristine Garbe M.D.   On: 07/24/2016 03:31    ASSESSMENT / PLAN: Hypotension and Bradycardia may be secondary to beta blocker Acute encephalopathy Hyperkalemia  HCAP P: Supplemental O2 to maintain O2 sats >92% Discontinued carvedilol Maintain map >60 and heart rate >50 will start low dose Dopamine if needed to maintain map and heart rate goal  Will hold off on CVL placement for now if pt requires pressors pts family agreeable to CVL placement Cardiology consulted appreciate input Ammonia level pending CT of head pending Repeat BMP Continue cefepime  Follow blood cultures  Frequent reorientation  Lights on during the day Promote family presence at bedside  Update: Spoke with pts son and daughters discussed plan of care and all questions answered.  According to pts son the pt has been confused at times at home since bypass surgery on 02/28/2016.  Marda Stalker, [place] Pager [phone] (please enter 7 digits) PCCM Consult Pager [phone] (please enter 7 digits)

## 2016-07-24 NOTE — Progress Notes (Signed)
07/24/2016 14:00  Notified by CCMD pt heart rate 39.  Charge RN checked on pt who was alert and asymptomatic.  Attempted to page attending MD Posey Pronto,  Did not receive a response.  Dola Argyle, RN

## 2016-07-24 NOTE — Progress Notes (Signed)
[place] [facility] [place] NAME: [name]    MR#:  [id number]  DATE OF BIRTH:  01-29-1951  SUBJECTIVE:   Came in with back pain more on the left lower found to be tachypneic and tachycardic in the ER found to have pneumonia. Patient feels a little better he appears to be weak. Denies any chest pain. REVIEW OF SYSTEMS:   Review of Systems  Constitutional: Negative for chills, fever and weight loss.  HENT: Negative for ear discharge, ear pain and nosebleeds.   Eyes: Negative for blurred vision, pain and discharge.  Respiratory: Negative for sputum production, shortness of breath, wheezing and stridor.   Cardiovascular: Negative for chest pain, palpitations, orthopnea and PND.  Gastrointestinal: Negative for abdominal pain, diarrhea, nausea and vomiting.  Genitourinary: Negative for frequency and urgency.  Musculoskeletal: Positive for back pain. Negative for joint pain.  Neurological: Positive for weakness. Negative for sensory change, speech change and focal weakness.  Psychiatric/Behavioral: Negative for depression and hallucinations. The patient is not nervous/anxious.    Tolerating Diet:yes Tolerating PT: Home health PT  DRUG ALLERGIES:   Allergies  Allergen Reactions  . Ivp Dye [Iodinated Diagnostic Agents] Other (See Comments)    Pt denied    VITALS:  Blood pressure 99/68, pulse 98, temperature 98.4 F (36.9 C), temperature source Oral, resp. rate 20, height 5\' 4"  (1.626 m), weight 63.5 kg (140 lb), SpO2 97 %.  PHYSICAL EXAMINATION:   Physical Exam  GENERAL:  66 y.o.-year-old patient lying in the bed with no acute distress.  EYES: Pupils equal, round, reactive to light and accommodation. No scleral icterus. Extraocular muscles intact.  HEENT: Head atraumatic, normocephalic. Oropharynx and nasopharynx clear.  NECK:  Supple, no jugular venous distention. No thyroid enlargement, no tenderness.  LUNGS:Decreased breath sounds  bilaterally, no wheezing, rales, rhonchi. No use of accessory muscles of respiration.  CARDIOVASCULAR: S1, S2 normal. No murmurs, rubs, or gallops.  ABDOMEN: Soft, nontender, nondistended. Bowel sounds present. No organomegaly or mass.  EXTREMITIES: No cyanosis, clubbing or edema b/l.    NEUROLOGIC: Cranial nerves II through XII are intact. No focal Motor or sensory deficits b/l.   PSYCHIATRIC:  patient is alert and oriented x 3.  SKIN: No obvious rash, lesion, or ulcer.   LABORATORY PANEL:  CBC  Recent Labs Lab 07/24/16 0806  WBC 20.1*  HGB 13.2  HCT 41.5  PLT 250    Chemistries   Recent Labs Lab 07/24/16 0806  NA 138  K 5.3*  CL 95*  CO2 23  GLUCOSE 169*  BUN 54*  CREATININE 10.42*  CALCIUM 8.2*  AST 575*  ALT 414*  ALKPHOS 347*  BILITOT 3.7*   Cardiac Enzymes  Recent Labs Lab 07/24/16 0806  TROPONINI 0.06*   RADIOLOGY:  Dg Chest 1 View  Result Date: 07/23/2016 CLINICAL DATA:  Back pain.  History of CHF, hypertension. EXAM: CHEST 1 VIEW COMPARISON:  02/26/2016 FINDINGS: Postoperative changes in the mediastinum. Vascular graft in the left upper extremity and axilla. Shallow inspiration. Mild cardiac enlargement without vascular congestion. There is infiltration or atelectasis in the left lung base behind the heart with possible small left pleural effusion. This may indicate an pneumonia. Right lung is clear. No pneumothorax. IMPRESSION: Infiltration in the left lung base with probable effusion suggesting pneumonia. Followup PA and lateral chest X-ray is recommended in 3-4 weeks following appropriate clinical therapy to ensure resolution and exclude underlying malignancy. Electronically Signed   By: Lucienne Capers  M.D.   On: 07/23/2016 22:13   Dg Thoracic Spine 2 View  Result Date: 07/24/2016 CLINICAL DATA:  66 y/o  M; 2 days of upper back pain. EXAM: THORACIC SPINE 2 VIEWS COMPARISON:  02/26/2016 chest radiograph FINDINGS: There is no evidence of thoracic spine  fracture. Alignment is normal. Discogenic degenerative changes of the upper lumbar spine with large marginal osteophytes. Aortic atherosclerosis with calcification. Sternotomy wires are aligned. IMPRESSION: No acute fracture or dislocation identified. Electronically Signed   By: Kristine Garbe M.D.   On: 07/24/2016 03:31   ASSESSMENT AND PLAN:    66 y.o. male with a history of chronic diastolic congestive heart failure, end-stage renal disease on hemodialysis status post failed kidney transplant, hypertension, hyperlipidemia, anemia of chronic disease, seizure disorder, gout and type 2 diabetes now being admitted with:  1. Healthcare Associated Pneumonia, this is an incidental finding however patient has been tachycardic, tachypnea in the emergency department - Admit to inpatient - IV Maxipime & Vancomycin -MRSA PCR negative--d/c vanc - Duonebs, expectorants & O2 therapy as needed - Follow up blood  Cultures---negative  2. Back pain, musculoskeletal in nature -Thoracic xray OK - PT eval appreciated--HHPT  3. ESRD on HD -Nephrology consult for continued dialysis -Continue multivitamin  4. History of chronic diastolic heart failure -Continue Coreg, nitroglycerin, losartan  5. History of coronary artery disease status post MI -Continue aspirin, Plavix, Coreg, nitroglycerin, Lipitor  6. History of GERD -Continue Protonix  7. History of seizures -Continue Keppra  8. elevated LFT's -check [place] right upper quadrant -not in HF, denies any right UQ pain -hold atorvastatin  Case discussed with Care Management/Social Worker. Management plans discussed with the patient, family and they are in agreement.  CODE STATUS: FULL  DVT Prophylaxis: heparin  TOTAL TIME TAKING CARE OF THIS PATIENT: 30 minutes.  >50% time spent on counselling and coordination of care  Left message for dter NILA  POSSIBLE D/C IN 1-2 DAYS, DEPENDING ON CLINICAL CONDITION.  Note: This  dictation was prepared with Dragon dictation along with smaller phrase technology. Any transcriptional errors that result from this process are unintentional.  Krystian Younglove M.D on 07/24/2016 at 1:31 PM  Between 7am to 6pm - Pager - [phone]  After 6pm go to [email or web address] - password [email or web address]  Office  [phone]  CC: Primary care physician; [name], MD

## 2016-07-24 NOTE — ED Notes (Signed)
Vancomycin on hold until better IV established.

## 2016-07-24 NOTE — Care Management (Signed)
[facility] HD liaison faxed HD clinical

## 2016-07-25 ENCOUNTER — Inpatient Hospital Stay
Admit: 2016-07-25 | Discharge: 2016-07-25 | Disposition: A | Discharge status: Home / Self Care | Payer: BLUE CROSS/BLUE SHIELD | Attending: Specialist | Admitting: Specialist

## 2016-07-25 ENCOUNTER — Inpatient Hospital Stay: Payer: BLUE CROSS/BLUE SHIELD

## 2016-07-25 DIAGNOSIS — R400 Somnolence: Secondary | ICD-10-CM

## 2016-07-25 DIAGNOSIS — I952 Hypotension due to drugs: Secondary | ICD-10-CM

## 2016-07-25 DIAGNOSIS — R001 Bradycardia, unspecified: Secondary | ICD-10-CM

## 2016-07-25 LAB — BASIC METABOLIC PANEL
ANION GAP: 19 — AB (ref 5–15)
Anion gap: 19 — ABNORMAL HIGH (ref 5–15)
BUN: 72 mg/dL — ABNORMAL HIGH (ref 6–20)
BUN: 29 mg/dL — AB (ref 6–20)
CALCIUM: 8.3 mg/dL — AB (ref 8.9–10.3)
CHLORIDE: 96 mmol/L — AB (ref 101–111)
CO2: 23 mmol/L (ref 22–32)
CO2: 23 mmol/L (ref 22–32)
CREATININE: 7.52 mg/dL — AB (ref 0.61–1.24)
Calcium: 7.9 mg/dL — ABNORMAL LOW (ref 8.9–10.3)
Chloride: 98 mmol/L — ABNORMAL LOW (ref 101–111)
Creatinine, Ser: 13.04 mg/dL — ABNORMAL HIGH (ref 0.61–1.24)
GFR calc non Af Amer: 3 mL/min — ABNORMAL LOW (ref >60)
GFR calc non Af Amer: 7 mL/min — ABNORMAL LOW (ref >60)
GFR, EST AFRICAN AMERICAN: 4 mL/min — AB (ref >60)
GFR, EST AFRICAN AMERICAN: 8 mL/min — AB (ref >60)
Glucose, Bld: 131 mg/dL — ABNORMAL HIGH (ref 65–99)
Glucose, Bld: 86 mg/dL (ref 65–99)
POTASSIUM: 5.1 mmol/L (ref 3.5–5.1)
Potassium: 4.3 mmol/L (ref 3.5–5.1)
SODIUM: 140 mmol/L (ref 135–145)
Sodium: 138 mmol/L (ref 135–145)

## 2016-07-25 LAB — CBC WITH DIFFERENTIAL/PLATELET
Basophils Absolute: 0.1 10*3/uL (ref 0–0.1)
Basophils Relative: 1 %
EOS PCT: 4 %
Eosinophils Absolute: 0.3 10*3/uL (ref 0–0.7)
HCT: 35.5 % — ABNORMAL LOW (ref 40.0–52.0)
HEMOGLOBIN: 11.4 g/dL — AB (ref 13.0–18.0)
LYMPHS ABS: 0.9 10*3/uL — AB (ref 1.0–3.6)
Lymphocytes Relative: 12 %
MCH: 23.8 pg — AB (ref 26.0–34.0)
MCHC: 32.1 g/dL (ref 32.0–36.0)
MCV: 74.2 fL — ABNORMAL LOW (ref 80.0–100.0)
MONOS PCT: 6 %
Monocytes Absolute: 0.5 10*3/uL (ref 0.2–1.0)
NEUTROS PCT: 77 %
Neutro Abs: 5.6 10*3/uL (ref 1.4–6.5)
PLATELETS: 202 10*3/uL (ref 150–440)
RBC: 4.79 MIL/uL (ref 4.40–5.90)
RDW: 19.6 % — AB (ref 11.5–14.5)
WBC: 7.3 10*3/uL (ref 3.8–10.6)

## 2016-07-25 LAB — HEPATIC FUNCTION PANEL
ALBUMIN: 3.5 g/dL (ref 3.5–5.0)
ALT: 236 U/L — ABNORMAL HIGH (ref 17–63)
AST: 186 U/L — AB (ref 15–41)
Alkaline Phosphatase: 278 U/L — ABNORMAL HIGH (ref 38–126)
BILIRUBIN DIRECT: 2.5 mg/dL — AB (ref 0.1–0.5)
Indirect Bilirubin: 1.5 mg/dL — ABNORMAL HIGH (ref 0.3–0.9)
Total Bilirubin: 4 mg/dL — ABNORMAL HIGH (ref 0.3–1.2)
Total Protein: 8 g/dL (ref 6.5–8.1)

## 2016-07-25 LAB — CBC
HEMATOCRIT: 36.7 % — AB (ref 40.0–52.0)
HEMOGLOBIN: 11.8 g/dL — AB (ref 13.0–18.0)
MCH: 23.7 pg — ABNORMAL LOW (ref 26.0–34.0)
MCHC: 32 g/dL (ref 32.0–36.0)
MCV: 74.1 fL — AB (ref 80.0–100.0)
PLATELETS: 210 10*3/uL (ref 150–440)
RBC: 4.96 MIL/uL (ref 4.40–5.90)
RDW: 19.5 % — ABNORMAL HIGH (ref 11.5–14.5)
WBC: 12.3 10*3/uL — AB (ref 3.8–10.6)

## 2016-07-25 LAB — GLUCOSE, CAPILLARY
GLUCOSE-CAPILLARY: 124 mg/dL — AB (ref 65–99)
GLUCOSE-CAPILLARY: 85 mg/dL (ref 65–99)
GLUCOSE-CAPILLARY: 96 mg/dL (ref 65–99)
Glucose-Capillary: 128 mg/dL — ABNORMAL HIGH (ref 65–99)

## 2016-07-25 LAB — ALBUMIN: ALBUMIN: 3.5 g/dL (ref 3.5–5.0)

## 2016-07-25 LAB — ECHOCARDIOGRAM COMPLETE
Height: 64 in
Weight: 2240 oz

## 2016-07-25 LAB — VANCOMYCIN, TROUGH: Vancomycin Tr: <4 ug/mL — ABNORMAL LOW (ref 15–20)

## 2016-07-25 NOTE — Progress Notes (Signed)
*  PRELIMINARY RESULTS* Echocardiogram 2D Echocardiogram has been performed.  Sherrie Sport 07/25/2016, 10:42 AM

## 2016-07-25 NOTE — Progress Notes (Signed)
Pre-hd tx 

## 2016-07-25 NOTE — Care Management (Signed)
Patient transferred to ICU 07/25/15. I have updated [name] dialysis liaison with Patient pathways.

## 2016-07-25 NOTE — Progress Notes (Signed)
[facility]  ROUNDING NOTE   Subjective:   Hypotensive and confused overnight. Transferred to ICU.  Alert this morning.  Dialysis for later today.   Objective:  Vital signs in last 24 hours:  Temp:  [97.5 F (36.4 C)-98.6 F (37 C)] 97.5 F (36.4 C) (02/08 0800) Pulse Rate:  [45-103] 79 (02/08 1000) Resp:  [14-24] 17 (02/08 1000) BP: (70-124)/(36-87) 119/50 (02/08 1000) SpO2:  [92 %-100 %] 96 % (02/08 1000)  Weight change:  Filed Weights   07/23/16 2142  Weight: 63.5 kg (140 lb)    Intake/Output: I/O last 3 completed shifts: In: 95 [IV Piggyback:95] Out: 0    Intake/Output this shift:  No intake/output data recorded.  Physical Exam: General: NAD,   Head: Normocephalic, atraumatic. Moist oral mucosal membranes  Eyes: Anicteric, PERRL  Neck: Supple, trachea midline  Lungs:  Clear to auscultation  Heart: Regular rate and rhythm  Abdomen:  Soft, nontender,   Extremities: no peripheral edema.  Neurologic: Nonfocal, moving all four extremities  Skin: No lesions  Access: Left forearm AVF    Basic Metabolic Panel:  Recent Labs Lab 07/23/16 2156 07/24/16 0806 07/24/16 1424 07/24/16 2049 07/25/16 0836  NA 138 138  --  138 138  K 5.3* 5.3*  --  5.5* 5.1  CL 93* 95*  --  95* 96*  CO2 28 23  --  26 23  GLUCOSE 180* 169*  --  140* 131*  BUN 46* 54*  --  64* 72*  CREATININE 9.43* 10.42*  --  11.68* 13.04*  CALCIUM 8.1* 8.2*  --  7.6* 7.9*  MG  --   --  1.7  --   --     Liver Function Tests:  Recent Labs Lab 07/24/16 0806  AST 575*  ALT 414*  ALKPHOS 347*  BILITOT 3.7*  PROT 8.9*  ALBUMIN 4.0   No results for input(s): LIPASE, AMYLASE in the last 168 hours.  Recent Labs Lab 07/24/16 2049  AMMONIA 24    CBC:  Recent Labs Lab 07/23/16 2156 07/24/16 0806 07/25/16 0836  WBC 14.6* 20.1* 12.3*  NEUTROABS 12.6* 18.1*  --   HGB 13.8 13.2 11.8*  HCT 43.6 41.5 36.7*  MCV 74.4* 74.9* 74.1*  PLT 285 250 210    Cardiac  Enzymes:  Recent Labs Lab 07/23/16 2156 07/24/16 0244 07/24/16 0806 07/24/16 1424  TROPONINI 0.03* 0.04* 0.06* 0.03*    BNP: Invalid input(s): POCBNP  CBG:  Recent Labs Lab 07/24/16 1121 07/24/16 1621 07/24/16 1708 07/24/16 2209 07/25/16 0654  GLUCAP 149* 194* 220* 115* 128*    Microbiology: Results for orders placed or performed during the hospital encounter of 07/23/16  Culture, blood (routine x 2) Call MD if unable to obtain prior to antibiotics being given     Status: None (Preliminary result)   Collection Time: 07/24/16  2:44 AM  Result Value Ref Range Status   Specimen Description BLOOD RIGHT HAND  Final   Special Requests BOTTLES DRAWN AEROBIC AND ANAEROBIC BCAV  Final   Culture NO GROWTH 1 DAY  Final   Report Status PENDING  Incomplete  MRSA PCR Screening     Status: None   Collection Time: 07/24/16  4:17 AM  Result Value Ref Range Status   MRSA by PCR NEGATIVE NEGATIVE Final    Comment:        The GeneXpert MRSA Assay (FDA approved for NASAL specimens only), is one component of a comprehensive MRSA colonization surveillance program. It is  not intended to diagnose MRSA infection nor to guide or monitor treatment for MRSA infections.   Culture, blood (routine x 2) Call MD if unable to obtain prior to antibiotics being given     Status: None (Preliminary result)   Collection Time: 07/24/16  8:06 AM  Result Value Ref Range Status   Specimen Description BLOOD R AC  Final   Special Requests BOTTLES DRAWN AEROBIC AND ANAEROBIC BCAV  Final   Culture NO GROWTH < 24 HOURS  Final   Report Status PENDING  Incomplete    Coagulation Studies: No results for input(s): LABPROT, INR in the last 72 hours.  Urinalysis: No results for input(s): COLORURINE, LABSPEC, PHURINE, GLUCOSEU, HGBUR, BILIRUBINUR, KETONESUR, PROTEINUR, UROBILINOGEN, NITRITE, LEUKOCYTESUR in the last 72 hours.  Invalid input(s): APPERANCEUR    Imaging: Dg Chest 1 View  Result Date:  07/23/2016 CLINICAL DATA:  Back pain.  History of CHF, hypertension. EXAM: CHEST 1 VIEW COMPARISON:  02/26/2016 FINDINGS: Postoperative changes in the mediastinum. Vascular graft in the left upper extremity and axilla. Shallow inspiration. Mild cardiac enlargement without vascular congestion. There is infiltration or atelectasis in the left lung base behind the heart with possible small left pleural effusion. This may indicate an pneumonia. Right lung is clear. No pneumothorax. IMPRESSION: Infiltration in the left lung base with probable effusion suggesting pneumonia. Followup PA and lateral chest X-ray is recommended in 3-4 weeks following appropriate clinical therapy to ensure resolution and exclude underlying malignancy. Electronically Signed   By: Lucienne Capers M.D.   On: 07/23/2016 22:13   Dg Thoracic Spine 2 View  Result Date: 07/24/2016 CLINICAL DATA:  66 y/o  M; 2 days of upper back pain. EXAM: THORACIC SPINE 2 VIEWS COMPARISON:  02/26/2016 chest radiograph FINDINGS: There is no evidence of thoracic spine fracture. Alignment is normal. Discogenic degenerative changes of the upper lumbar spine with large marginal osteophytes. Aortic atherosclerosis with calcification. Sternotomy wires are aligned. IMPRESSION: No acute fracture or dislocation identified. Electronically Signed   By: Kristine Garbe M.D.   On: 07/24/2016 03:31     Medications:    . allopurinol  100 mg Oral Daily  . aspirin EC  81 mg Oral Daily  . B-complex with vitamin C  1 tablet Oral Daily  . clopidogrel  75 mg Oral Q breakfast  . dextromethorphan  30 mg Oral BID   And  . guaiFENesin  600 mg Oral BID  . heparin  5,000 Units Subcutaneous Q8H  . insulin aspart  0-9 Units Subcutaneous TID WC  . levETIRAcetam  500 mg Oral Q12H  . Melatonin  2.5 mg Oral QHS  . mirtazapine  7.5 mg Oral QHS  . multivitamin  1 tablet Oral QHS  . pantoprazole  40 mg Oral Daily   cyclobenzaprine, ipratropium-albuterol, morphine  injection, nitroGLYCERIN, ondansetron (ZOFRAN) IV, oxyCODONE  Assessment/ Plan:  [name] is a 66 y.o. Asian [place]) male with End stage kidney disease status post transplant now on hemodialysis., hypertension, coronary artery disease, meningitis, hyperlipidemia, gout, diabetes mellitus type II, admitted to [facility] on 07/23/2016 for Hospital-acquired pneumonia [J18.9] Thoracic back pain, unspecified back pain laterality, unspecified chronicity [M54.6]   TTS [place]. Left AVF  1. End Stage Renal Disease: order for dialysis prepared. Dialysis for later today.  - continue TTS schedule.   2. Hypertension: hypotensive.  - holding losartan  3. Anemia of chronic kidney disease: hemoglobin 11.8 - hold epo.   4. Secondary Hyperparathyroidism: outpatient PTH, phos and calcium  at goal. Not currently on any binders.   5. Pneumonia: appreciate pulmonary input    LOS: [place], [place] 2/8/201810:29 AM

## 2016-07-25 NOTE — Care Management (Signed)
Attempted to meet with patient but he was sleeping. Home health list with this RNCM contact information left at patient's bedside. RNCM to continue to follow. He is currently requiring O2.

## 2016-07-25 NOTE — Progress Notes (Signed)
PT Cancellation Note  Patient Details Name: [name] MRN: [id number] DOB: 10-Jan-1951   Cancelled Treatment:    Reason Eval/Treat Not Completed: Other (comment). Pt with noted rapid response yesterday secondary to decreased responsiveness and hypotension. Pt transferred to CCU. Pt with low BP this date 88/60. Secondary to change in status, will need new orders to resume care. Will dc current orders.   Brittain Hosie 07/25/2016, 8:07 AM  Greggory Stallion, PT, DPT [phone]

## 2016-07-25 NOTE — Progress Notes (Signed)
Hemodialysis treatment completed.

## 2016-07-25 NOTE — Progress Notes (Signed)
Hemodialysis started

## 2016-07-25 NOTE — Consult Note (Signed)
[facility]  CARDIOLOGY CONSULT NOTE  Patient ID: [name] MRN: [id number] DOB/AGE: 12-01-1950 66 y.o.  Admit date: 07/23/2016 Referring Physician Dr. Serita Grit Primary Physician Dr. Neldon Mc Primary Cardiologist Dr. [name] Reason for Consultation Bradycardia/hypotension  HPI: Pt is a 66 yo male with history of cad s/p nstemi 12/16 with placement of an ostial stent in the proximal lad followed by a cabg with a lima to the [place] 9/17, ischemic cardiomyopathy with HFrEF with ef of 35% s/p renal trpt 1/04 with transplant failure and nephrectomy in 11/15 no on HD who was admitted with right sided thoracic pain c/w musculoskeletal  Source. . He had been admited to [facility] with hyperkalemia and hr in the 30's.  He had his metoprolol discontinued. During this admission he ruled out for mi with trivial troponin elevation to 0.06 felt to be due to renal insuffiency.  He became unresponsive and had juctional bradycardia on ekg at 44. He was transferred to icu for bradycardia and hypotension. He was on carvedilol 3.125 bid which was stopped.K was 5.3. This am his heart rate is 77 with nsr. He is hypotensive with sbp 80-117. He denies chest pain. Abd u/s pending. He remains on levetiracetam for post op seizures.  Review of Systems  HENT: Negative.   Eyes: Negative.   Respiratory: Positive for shortness of breath.   Cardiovascular: Negative.   Gastrointestinal: Positive for abdominal pain.  Genitourinary: Negative.   Musculoskeletal: Negative.   Skin: Negative.   Neurological: Positive for weakness.  Endo/Heme/Allergies: Negative.   Psychiatric/Behavioral: Negative.     Past Medical History:  Diagnosis Date  . Chronic diastolic congestive heart failure ([place])   . Chronic disease anemia   . ESRD (end stage renal disease) on dialysis ([place])    "[place]; [place]; [name]" (09/29/2014)  . GERD (gastroesophageal reflux disease)   . Gout   . High cholesterol   .  History of blood transfusion    "related to anemia"  . History of stomach ulcers   . Hypertension   . Type II diabetes mellitus (HCC)     Family History  Problem Relation Age of Onset  . Hypertension Mother   . Stroke Mother   . Hypertension Father   . Diabetes Mellitus II Father   . Asthma Father     Social History   Social History  . Marital status: Married    Spouse name: N/A  . Number of children: N/A  . Years of education: N/A   Occupational History  . Not on file.   Social History Main Topics  . Smoking status: Former Smoker    Types: Cigarettes  . Smokeless tobacco: Never Used     Comment: "quit smoking cigarettes in the 1980's"  . Alcohol use No  . Drug use: No  . Sexual activity: Not on file   Other Topics Concern  . Not on file   Social History Narrative  . No narrative on file    Past Surgical History:  Procedure Laterality Date  . ARTERIOVENOUS GRAFT PLACEMENT Left ~ 1996  . CARDIAC CATHETERIZATION  09/29/2014   "[place]"  . CARDIAC CATHETERIZATION N/A 02/26/2016   Procedure: Left Heart Cath and Coronary Angiography Possible PCI;  Surgeon: [name], MD;  Location: [facility] LAB;  Service: Cardiovascular;  Laterality: N/A;  . KIDNEY TRANSPLANT Right 2004  . NEPHRECTOMY TRANSPLANTED ORGAN  2015  . PERCUTANEOUS CORONARY STENT INTERVENTION (PCI-S) N/A 09/30/2014   Procedure: PERCUTANEOUS  CORONARY STENT INTERVENTION (PCI-S);  Surgeon: [name], MD;  Location: [facility] CATH LAB;  Service: Cardiovascular;  Laterality: N/A;  . PERITONEAL CATHETER INSERTION  02/2014  . PERITONEAL CATHETER REMOVAL  08/2014  . THROMBECTOMY / ARTERIOVENOUS GRAFT REVISION  2015     Prescriptions Prior to Admission  Medication Sig Dispense Refill Last Dose  . acetaminophen (TYLENOL) 325 MG tablet Take 2 tablets (650 mg total) by mouth every 6 (six) hours as needed for mild pain (or Fever >/= 101). 30 tablet 0 07/23/2016 at Unknown time  . albuterol (PROVENTIL) (2.5  MG/3ML) 0.083% nebulizer solution Take 2.5 mg by nebulization every 6 (six) hours as needed for wheezing or shortness of breath.     . allopurinol (ZYLOPRIM) 100 MG tablet Take 100 mg by mouth daily.  10 07/23/2016 at Unknown time  . aspirin 81 MG EC tablet Take 81 mg by mouth daily.  10 07/23/2016 at Unknown time  . atorvastatin (LIPITOR) 40 MG tablet Take 1 tablet (40 mg total) by mouth daily at 6 PM. 30 tablet 0 07/23/2016 at Unknown time  . B Complex-C (B-COMPLEX WITH VITAMIN C) tablet Take 1 tablet by mouth daily.   07/23/2016 at Unknown time  . clopidogrel (PLAVIX) 75 MG tablet Take 1 tablet (75 mg total) by mouth daily with breakfast. 30 tablet 11 07/23/2016 at Unknown time  . epoetin alfa (EPOGEN,PROCRIT) 4000 UNIT/ML injection Inject 4,000 Units into the vein.     . heparin 100-0.45 UNIT/ML-% infusion Inject 1,000 Units/hr into the vein continuous. 250 mL 0   . levETIRAcetam (KEPPRA) 500 MG tablet Take 500 mg by mouth every 12 (twelve) hours.   07/23/2016 at Unknown time  . Melatonin 3 MG TABS Take 3 mg by mouth at bedtime.   Past Week at Unknown time  . mirtazapine (REMERON) 7.5 MG tablet Take 7.5 mg by mouth at bedtime.   07/23/2016 at Unknown time  . multivitamin (RENA-VIT) TABS tablet Take 1 tablet by mouth at bedtime. 30 tablet 3 07/23/2016 at Unknown time  . nitroGLYCERIN (NITROGLYN) 2 % ointment Apply 0.5 inches topically every 6 (six) hours. 30 g 0   . nitroGLYCERIN (NITROSTAT) 0.4 MG SL tablet Place 1 tablet (0.4 mg total) under the tongue every 5 (five) minutes x 3 doses as needed for chest pain. 25 tablet 12 unknown at unknown  . pantoprazole (PROTONIX) 40 MG tablet Take 40 mg by mouth daily.   07/23/2016 at Unknown time  . carvedilol (COREG) 3.125 MG tablet Take 3.125 mg by mouth 2 (two) times daily with a meal.   Not Taking at Unknown time  . losartan (COZAAR) 100 MG tablet Take 1 tablet (100 mg total) by mouth daily. (Patient not taking: Reported on 07/24/2016) 30 tablet 0 Not Taking at Unknown  time    Physical Exam: Blood pressure (!) 88/60, pulse 80, temperature 98.2 F (36.8 C), resp. rate 16, height 5\' 4"  (1.626 m), weight 63.5 kg (140 lb), SpO2 93 %.   Wt Readings from Last 1 Encounters:  07/23/16 63.5 kg (140 lb)     General appearance: alert and cooperative Resp: rhonchi bilaterally Chest wall: no tenderness Cardio: regular rate and rhythm GI: abnormal findings:  mild tenderness in the lower abdomen Extremities: extremities normal, atraumatic, no cyanosis or edema Neurologic: Grossly normal  Labs:   Lab Results  Component Value Date   WBC 20.1 (H) 07/24/2016   HGB 13.2 07/24/2016   HCT 41.5 07/24/2016   MCV 74.9 (L) 07/24/2016  PLT 250 07/24/2016    Recent Labs Lab 07/24/16 0806 07/24/16 2049  NA 138 138  K 5.3* 5.5*  CL 95* 95*  CO2 23 26  BUN 54* 64*  CREATININE 10.42* 11.68*  CALCIUM 8.2* 7.6*  PROT 8.9*  --   BILITOT 3.7*  --   ALKPHOS 347*  --   ALT 414*  --   AST 575*  --   GLUCOSE 169* 140*   Lab Results  Component Value Date   CKTOTAL 42 (L) 09/27/2014   CKMB 0.5 09/27/2014   TROPONINI 0.03 (HH) 07/24/2016       EKG: junctional rhythm/nsr  ASSESSMENT AND PLAN:  66 yo male  With esrd on hd, s/p single vessel cabg after faily ostial lad pci whose post op course has been complicated by seizures, chest wall pain, and bradycardia. Admitted with complaints of back and chest discomfort and was noted to become unresponsive and hypotensive with bradycardia yesteray. Transferred to ccu. Heart rate is improved. Still somewhat hypotensive. Alterred mental status is improved. Awaiting abd u/s this am. No off of carvedilol. Would remain off of any av nodal meds. Continue hd as tolerated. Keep K below 5.5 as he has had hyperkalemia induced bradycardia in the past.  Signed: Teodoro Spray MD, [facility] 07/25/2016, 7:28 AM

## 2016-07-25 NOTE — Progress Notes (Signed)
[place] [facility] [place] NAME: [name]    MR#:  [id number]  DATE OF BIRTH:  1950/11/17  SUBJECTIVE:   Came in with back pain more on the left lower found to be tachypneic and tachycardic in the ER found to have pneumonia. Patient feels a little better he appears to be weak. Denies any chest pain.  Patient was transferred overnight into the ICU because he bradycardic and hypotensive. He was less responsive. All the workup essentially remained negative. His bradycardia is resolved. Denies any complaints today. REVIEW OF SYSTEMS:   Review of Systems  Constitutional: Negative for chills, fever and weight loss.  HENT: Negative for ear discharge, ear pain and nosebleeds.   Eyes: Negative for blurred vision, pain and discharge.  Respiratory: Negative for sputum production, shortness of breath, wheezing and stridor.   Cardiovascular: Negative for chest pain, palpitations, orthopnea and PND.  Gastrointestinal: Negative for abdominal pain, diarrhea, nausea and vomiting.  Genitourinary: Negative for frequency and urgency.  Musculoskeletal: Positive for back pain. Negative for joint pain.  Neurological: Positive for weakness. Negative for sensory change, speech change and focal weakness.  Psychiatric/Behavioral: Negative for depression and hallucinations. The patient is not nervous/anxious.    Tolerating Diet:yes Tolerating PT: Home health PT  DRUG ALLERGIES:   Allergies  Allergen Reactions  . Ivp Dye [Iodinated Diagnostic Agents] Other (See Comments)    Pt denied    VITALS:  Blood pressure 118/79, pulse 73, temperature 97.7 F (36.5 C), temperature source Oral, resp. rate 13, height 5\' 4"  (1.626 m), weight 63.5 kg (140 lb), SpO2 91 %.  PHYSICAL EXAMINATION:   Physical Exam  GENERAL:  66 y.o.-year-old patient lying in the bed with no acute distress.  EYES: Pupils equal, round, reactive to light and accommodation. No scleral icterus.  Extraocular muscles intact.  HEENT: Head atraumatic, normocephalic. Oropharynx and nasopharynx clear.  NECK:  Supple, no jugular venous distention. No thyroid enlargement, no tenderness.  LUNGS:Decreased breath sounds bilaterally, no wheezing, rales, rhonchi. No use of accessory muscles of respiration.  CARDIOVASCULAR: S1, S2 normal. No murmurs, rubs, or gallops.  ABDOMEN: Soft, nontender, nondistended. Bowel sounds present. No organomegaly or mass.  EXTREMITIES: No cyanosis, clubbing or edema b/l.    NEUROLOGIC: Cranial nerves II through XII are intact. No focal Motor or sensory deficits b/l.   PSYCHIATRIC:  patient is alert and oriented x 3.  SKIN: No obvious rash, lesion, or ulcer.   LABORATORY PANEL:  CBC  Recent Labs Lab 07/25/16 0836  WBC 12.3*  HGB 11.8*  HCT 36.7*  PLT 210    Chemistries   Recent Labs Lab 07/24/16 0806 07/24/16 1424  07/25/16 0836  NA 138  --   < > 138  K 5.3*  --   < > 5.1  CL 95*  --   < > 96*  CO2 23  --   < > 23  GLUCOSE 169*  --   < > 131*  BUN 54*  --   < > 72*  CREATININE 10.42*  --   < > 13.04*  CALCIUM 8.2*  --   < > 7.9*  MG  --  1.7  --   --   AST 575*  --   --   --   ALT 414*  --   --   --   ALKPHOS 347*  --   --   --   BILITOT 3.7*  --   --   --   < > =  values in this interval not displayed. Cardiac Enzymes  Recent Labs Lab 07/24/16 1424  TROPONINI 0.03*   RADIOLOGY:  Dg Chest 1 View  Result Date: 07/23/2016 CLINICAL DATA:  Back pain.  History of CHF, hypertension. EXAM: CHEST 1 VIEW COMPARISON:  02/26/2016 FINDINGS: Postoperative changes in the mediastinum. Vascular graft in the left upper extremity and axilla. Shallow inspiration. Mild cardiac enlargement without vascular congestion. There is infiltration or atelectasis in the left lung base behind the heart with possible small left pleural effusion. This may indicate an pneumonia. Right lung is clear. No pneumothorax. IMPRESSION: Infiltration in the left lung base with  probable effusion suggesting pneumonia. Followup PA and lateral chest X-ray is recommended in 3-4 weeks following appropriate clinical therapy to ensure resolution and exclude underlying malignancy. Electronically Signed   By: Lucienne Capers M.D.   On: 07/23/2016 22:13   Dg Thoracic Spine 2 View  Result Date: 07/24/2016 CLINICAL DATA:  66 y/o  M; 2 days of upper back pain. EXAM: THORACIC SPINE 2 VIEWS COMPARISON:  02/26/2016 chest radiograph FINDINGS: There is no evidence of thoracic spine fracture. Alignment is normal. Discogenic degenerative changes of the upper lumbar spine with large marginal osteophytes. Aortic atherosclerosis with calcification. Sternotomy wires are aligned. IMPRESSION: No acute fracture or dislocation identified. Electronically Signed   By: Kristine Garbe M.D.   On: 07/24/2016 03:31   US Abdomen Limited Ruq  Result Date: 07/25/2016 CLINICAL DATA:  Elevated liver function studies. History of chronic renal failure and right nephrectomy with failed transplant. EXAM: US ABDOMEN LIMITED - RIGHT UPPER QUADRANT COMPARISON:  Noncontrast abdominal CT scan of December 12, 2014 FINDINGS: Gallbladder: The gallbladder is adequately distended. No echogenic mobile shadowing stones are observed. There is small amount of sludge. There is no gallbladder wall thickening, pericholecystic fluid, or positive sonographic Murphy's sign. Common bile duct: Diameter: 8.4 mm tapering to 6.7 mm at the level of the pancreatic head. Liver: The hepatic echotexture is normal. There is no focal mass nor ductal dilation. IMPRESSION: Small amount of gallbladder sludge with no sonographic evidence of acute cholecystitis. If chronic cholecystitis is suspected clinically, a nuclear medicine hepatobiliary scan with gallbladder ejection fraction determination may be useful. Normal hepatic echotexture with no parenchymal abnormality observed. Minimal dilation of the common bile duct which does exhibits a normal tapering  caliber. Electronically Signed   By: David  Martinique M.D.   On: 07/25/2016 10:31   ASSESSMENT AND PLAN:    66 y.o. male with a history of chronic diastolic congestive heart failure, end-stage renal disease on hemodialysis status post failed kidney transplant, hypertension, hyperlipidemia, anemia of chronic disease, seizure disorder, gout and type 2 diabetes now being admitted with:  1. Healthcare Associated Pneumonia, this is an incidental finding however patient has been tachycardic, tachypnea in the emergency department - Admit to inpatient - IV Maxipime & Vancomycin -MRSA PCR negative--d/c vanc - Duonebs, expectorants & O2 therapy as needed - Follow up blood  Cultures---negative  2. Back pain, musculoskeletal in nature -Thoracic xray OK - PT eval appreciated--HHPT  3. ESRD on HD -Nephrology consult for continued dialysis -Continue multivitamin  4. Transient bradycardia with hypotension resolved. - History of chronic diastolic heart failure -Continue  Nitroglycerin -Patient's Coreg was discontinued -If blood pressure improves resume losartan prior to discharge  5. History of coronary artery disease status post MI -Continue aspirin, Plavix,  Nitroglycerin -Lipitor held secondary to elevated LFTs.  6. History of GERD -Continue Protonix  7. History of seizures -Continue Keppra  8. elevated LFT's -not in HF, denies any right UQ pain -hold atorvastatin -Ultrasound abdomen unremarkable  Case discussed with Care Management/Social Worker. Management plans discussed with the patient, family and they are in agreement.  CODE STATUS: FULL  DVT Prophylaxis: heparin  TOTAL TIME TAKING CARE OF THIS PATIENT: 30 minutes.  >50% time spent on counselling and coordination of care  Left message for dter NILA  POSSIBLE D/C IN 1-2 DAYS, DEPENDING ON CLINICAL CONDITION.  Note: This dictation was prepared with Dragon dictation along with smaller phrase technology. Any  transcriptional errors that result from this process are unintentional.  Lourdes Manning M.D on 07/25/2016 at 3:10 PM  Between 7am to 6pm - Pager - [phone]  After 6pm go to [email or web address] - password [email or web address]  Office  [phone]  CC: Primary care physician; [name], MD

## 2016-07-25 NOTE — Progress Notes (Signed)
Post hd tx 

## 2016-07-25 NOTE — Progress Notes (Signed)
Patient has been sleeping for the majority of this writer's shift (while he was actually on the unit). Patent had HD in the dialysis unit. See HD RN note. Patient returned from dialysis - had no complaints.

## 2016-07-25 NOTE — Progress Notes (Signed)
Pharmacy Antibiotic Note  [name] is a 66 y.o. male admitted on 07/23/2016 with pneumonia.  Pharmacy has been consulted for cefepime dosing.  Plan: HD patient  Cefepime 1 gram q 24 hours ordered  Height: 5\' 4"  (162.6 cm) Weight: 140 lb (63.5 kg) IBW/kg (Calculated) : 59.2  Temp (24hrs), Avg:98.2 F (36.8 C), Min:97.5 F (36.4 C), Max:98.6 F (37 C)   Recent Labs Lab 07/23/16 2156 07/24/16 0806 07/24/16 2049 07/25/16 0544 07/25/16 0836  WBC 14.6* 20.1*  --   --  12.3*  CREATININE 9.43* 10.42* 11.68*  --  13.04*  VANCOTROUGH  --   --   --  <4*  --     Estimated Creatinine Clearance: 4.7 mL/min (by C-G formula based on SCr of 13.04 mg/dL (H)).    Allergies  Allergen Reactions  . Ivp Dye [Iodinated Diagnostic Agents] Other (See Comments)    Pt denied    Antimicrobials this admission: vancomycin 2/7 >> 2/8 cefepime 2/7 >>   Dose adjustments this admission:   Microbiology results: 2/7 MRSA PCR: negative  2/7 BCx: NGTD  2/6 CXR: L base infiltrate  Thank you for allowing pharmacy to be a part of this patient's care.  Loree Fee, PharmD 07/25/2016 12:49 PM

## 2016-07-25 NOTE — Progress Notes (Signed)
Post hemodialysis note: Experienced asymptomatic hypotension after 2hours of starting treatment,corrected with ultrafiltration goal off,Dr.Kolluru made aware during his rounds.Blood pressure went into low 70s, but stabilized back to low 100s.Completed 3hours of treatment with no fluid removal.

## 2016-07-26 ENCOUNTER — Inpatient Hospital Stay: Payer: BLUE CROSS/BLUE SHIELD

## 2016-07-26 DIAGNOSIS — G9340 Encephalopathy, unspecified: Secondary | ICD-10-CM

## 2016-07-26 DIAGNOSIS — R4182 Altered mental status, unspecified: Secondary | ICD-10-CM

## 2016-07-26 LAB — CBC
HEMATOCRIT: 35.4 % — AB (ref 40.0–52.0)
HEMOGLOBIN: 11 g/dL — AB (ref 13.0–18.0)
MCH: 23.4 pg — ABNORMAL LOW (ref 26.0–34.0)
MCHC: 31.1 g/dL — AB (ref 32.0–36.0)
MCV: 75.3 fL — ABNORMAL LOW (ref 80.0–100.0)
Platelets: 200 10*3/uL (ref 150–440)
RBC: 4.7 MIL/uL (ref 4.40–5.90)
RDW: 19.6 % — ABNORMAL HIGH (ref 11.5–14.5)
WBC: 7.8 10*3/uL (ref 3.8–10.6)

## 2016-07-26 LAB — GLUCOSE, CAPILLARY
Glucose-Capillary: 111 mg/dL — ABNORMAL HIGH (ref 65–99)
Glucose-Capillary: 119 mg/dL — ABNORMAL HIGH (ref 65–99)
Glucose-Capillary: 113 mg/dL — ABNORMAL HIGH (ref 65–99)
Glucose-Capillary: 120 mg/dL — ABNORMAL HIGH (ref 65–99)

## 2016-07-26 LAB — BASIC METABOLIC PANEL
Anion gap: 21 — ABNORMAL HIGH (ref 5–15)
BUN: 34 mg/dL — ABNORMAL HIGH (ref 6–20)
CO2: 22 mmol/L (ref 22–32)
CREATININE: 8.26 mg/dL — AB (ref 0.61–1.24)
Calcium: 8.1 mg/dL — ABNORMAL LOW (ref 8.9–10.3)
Chloride: 97 mmol/L — ABNORMAL LOW (ref 101–111)
GFR calc non Af Amer: 6 mL/min — ABNORMAL LOW (ref >60)
GFR, EST AFRICAN AMERICAN: 7 mL/min — AB (ref >60)
Glucose, Bld: 97 mg/dL (ref 65–99)
Potassium: 4.5 mmol/L (ref 3.5–5.1)
Sodium: 140 mmol/L (ref 135–145)

## 2016-07-26 LAB — HEPATITIS B SURFACE ANTIGEN: HEP B S AG: NEGATIVE

## 2016-07-26 MED ORDER — LEVOFLOXACIN IN D5W 750 MG/150ML IV SOLN
750.0000 mg | Freq: Once | INTRAVENOUS | Status: AC
  Administered 2016-07-26: 750 mg via INTRAVENOUS
  Filled 2016-07-26: qty 150

## 2016-07-26 MED ORDER — SODIUM CHLORIDE 0.9 % IV SOLN
1000.0000 mg | Freq: Once | INTRAVENOUS | Status: AC
  Administered 2016-07-26: 1000 mg via INTRAVENOUS
  Filled 2016-07-26: qty 10

## 2016-07-26 MED ORDER — LEVOFLOXACIN IN D5W 500 MG/100ML IV SOLN
500.0000 mg | INTRAVENOUS | Status: DC
  Filled 2016-07-26: qty 100

## 2016-07-26 MED ORDER — SODIUM CHLORIDE 0.9 % IV SOLN
1000.0000 mg | Freq: Two times a day (BID) | INTRAVENOUS | Status: DC
  Administered 2016-07-27 – 2016-07-29 (×5): 1000 mg via INTRAVENOUS
  Filled 2016-07-26 (×7): qty 10

## 2016-07-26 NOTE — Progress Notes (Signed)
[name] at [place] NAME: [name]    MR#:  [id number]  DATE OF BIRTH:  Mar 22, 1951  SUBJECTIVE:   Pt. Remains altered and confused this a.m.  Hemodynamically stable.  No other acute events overnight.   REVIEW OF SYSTEMS:   Review of Systems  Unable to perform ROS: Mental acuity   Tolerating Diet: yes Tolerating PT: Home health PT  DRUG ALLERGIES:   Allergies  Allergen Reactions  . Ivp Dye [Iodinated Diagnostic Agents] Other (See Comments)    Pt denied    VITALS:  Blood pressure 118/66, pulse 85, temperature 97.6 F (36.4 C), temperature source Oral, resp. rate 16, height 5\' 4"  (1.626 m), weight 74.3 kg (163 lb 14.4 oz), SpO2 94 %.  PHYSICAL EXAMINATION:   Physical Exam  GENERAL:  66 y.o.-year-old patient lying in the bed confused/encephalopathic EYES: Pupils equal, round, reactive to light and accommodation. No scleral icterus. Extraocular muscles intact.  HEENT: Head atraumatic, normocephalic. Oropharynx and nasopharynx clear.  NECK:  Supple, no jugular venous distention. No thyroid enlargement, no tenderness.  LUNGS: Good A/E b/l, no wheezing, rales, rhonchi. No use of accessory muscles of respiration.  CARDIOVASCULAR: S1, S2 normal. No murmurs, rubs, or gallops.  ABDOMEN: Soft, nontender, nondistended. Bowel sounds present. No organomegaly or mass.  EXTREMITIES: No cyanosis, clubbing or edema b/l.    NEUROLOGIC: Cranial nerves II through XII are intact. No focal Motor or sensory deficits b/l. Globally weak   PSYCHIATRIC:  patient is alert and oriented x 1.  SKIN: No obvious rash, lesion, or ulcer.   Right upper extremity AV fistula with good bruit and good thrill  LABORATORY PANEL:  CBC  Recent Labs Lab 07/26/16 0403  WBC 7.8  HGB 11.0*  HCT 35.4*  PLT 200    Chemistries   Recent Labs Lab 07/24/16 1424  07/25/16 0836  07/26/16 0403  NA  --   < > 138  < > 140  K  --   < > 5.1  < > 4.5  CL  --    < > 96*  < > 97*  CO2  --   < > 23  < > 22  GLUCOSE  --   < > 131*  < > 97  BUN  --   < > 72*  < > 34*  CREATININE  --   < > 13.04*  < > 8.26*  CALCIUM  --   < > 7.9*  < > 8.1*  MG 1.7  --   --   --   --   AST  --   --  186*  --   --   ALT  --   --  236*  --   --   ALKPHOS  --   --  278*  --   --   BILITOT  --   --  4.0*  --   --   < > = values in this interval not displayed. Cardiac Enzymes  Recent Labs Lab 07/24/16 1424  TROPONINI 0.03*   RADIOLOGY:  Ct Head Wo Contrast  Result Date: 07/26/2016 CLINICAL DATA:  Altered mental status. Pneumonia. Bradycardia and hypotension with decreased responsiveness. EXAM: CT HEAD WITHOUT CONTRAST TECHNIQUE: Contiguous axial images were obtained from the base of the skull through the vertex without intravenous contrast. COMPARISON:  02/11/2015 FINDINGS: Brain: Diffuse cerebral atrophy. Ventricular dilatation consistent with central atrophy. Low-attenuation changes in the deep white matter consistent with small  vessel ischemia. No mass effect or midline shift. No abnormal extra-axial fluid collections. Gray-white matter junctions are distinct. Basal cisterns are not effaced. No acute intracranial hemorrhage. Vascular: Prominent vascular calcifications are present. Skull: Normal. Negative for fracture or focal lesion. Sinuses/Orbits: No acute finding. Other: No significant changes since previous study. IMPRESSION: No acute intracranial abnormalities. Chronic atrophy and small vessel ischemic changes. Electronically Signed   By: Lucienne Capers M.D.   On: 07/26/2016 01:49   Mr Brain Wo Contrast  Result Date: 07/26/2016 CLINICAL DATA:  66 year old male with acute encephalopathy, unresponsive status post admission and starting on treatment for pneumonia. Hypotensive episodes. Initial encounter. EXAM: MRI HEAD WITHOUT CONTRAST TECHNIQUE: Multiplanar, multiecho pulse sequences of the brain and surrounding structures were obtained without intravenous contrast.  COMPARISON:  Head CTs without contrast 0134 hours today and earlier. Limited brain MRI 12/13/2014. FINDINGS: Study is mildly degraded by motion artifact despite repeated imaging attempts. Brain: No restricted diffusion to suggest acute infarction. No midline shift, mass effect, evidence of mass lesion, ventriculomegaly, extra-axial collection or acute intracranial hemorrhage. Cervicomedullary junction and pituitary are within normal limits. 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 Probable small chronic lacunar infarct in the posterior right pons. Patchy nonspecific bilateral cerebral white matter T2 and FLAIR hyperintensity. No cortical encephalomalacia or chronic cerebral blood products are evident. Deep gray matter nuclei appear normal. Vascular: Major intracranial vascular flow voids appear preserved. Skull and upper cervical spine: Upper cervical spine poorly visualized. Skull bone marrow signal within normal limits. Sinuses/Orbits: Negative orbit soft tissues. Trace paranasal sinus mucosal thickening. Other: Visible internal auditory structures appear normal. Mastoids are clear. Negative scalp soft tissues. IMPRESSION: 1.  No acute intracranial abnormality. 2. Mild to moderate chronic small vessel ischemia in the brainstem and cerebellum. Less pronounced chronic cerebral white matter changes probably also are small vessel disease related. Electronically Signed   By: Genevie Ann M.D.   On: 07/26/2016 13:36   US Abdomen Limited Ruq  Result Date: 07/25/2016 CLINICAL DATA:  Elevated liver function studies. History of chronic renal failure and right nephrectomy with failed transplant. EXAM: US ABDOMEN LIMITED - RIGHT UPPER QUADRANT COMPARISON:  Noncontrast abdominal CT scan of December 12, 2014 FINDINGS: Gallbladder: The gallbladder is adequately distended. No echogenic mobile shadowing stones are observed. There is small amount of sludge. There is no gallbladder wall thickening, pericholecystic fluid, or  positive sonographic Murphy's sign. Common bile duct: Diameter: 8.4 mm tapering to 6.7 mm at the level of the pancreatic head. Liver: The hepatic echotexture is normal. There is no focal mass nor ductal dilation. IMPRESSION: Small amount of gallbladder sludge with no sonographic evidence of acute cholecystitis. If chronic cholecystitis is suspected clinically, a nuclear medicine hepatobiliary scan with gallbladder ejection fraction determination may be useful. Normal hepatic echotexture with no parenchymal abnormality observed. Minimal dilation of the common bile duct which does exhibits a normal tapering caliber. Electronically Signed   By: David  Martinique M.D.   On: 07/25/2016 10:31   ASSESSMENT AND PLAN:    66 y.o. male with a history of chronic diastolic congestive heart failure, end-stage renal disease on hemodialysis status post failed kidney transplant, hypertension, hyperlipidemia, anemia of chronic disease, seizure disorder, gout and type 2 diabetes now being admitted with:  1. Healthcare Associated Pneumonia, this is an incidental finding however patient has been tachycardic, tachypnea in the emergency department - MRSA PCR negative--d/c vanc.   - Will start on Levaquin for a few days discussed w/ Pharmacy.  - Follow  up blood  Cultures---negative so far  2. Back pain, musculoskeletal in nature -Thoracic xray OK - PT eval appreciated--HHPT  3. AMS/Encephalopathy - etiology unclear.  Previously thought to be due to transient Hyptension, Bradycardia but that has resolved and pt. Is still altered.  - Neuro consult. CT head, MRI Brain (-). Await EEG results.   4. ESRD on HD -Nephrology consult for continued dialysis -Continue multivitamin  5. Transient bradycardia with hypotension resolved. - History of chronic diastolic heart failure - hold Coreg.  - If blood pressure improves resume losartan prior to discharge  6. History of coronary artery disease status post MI -Continue  aspirin, Plavix,  Nitroglycerin  7. History of GERD -Continue Protonix  8. History of seizures -Continue Keppra, Await EEG. Neuro consult apprecaited.   9. elevated LFT's -not in HF, denies any right UQ pain -hold atorvastatin -Ultrasound abdomen unremarkable, LFT's improved.   Case discussed with Care Management/Social Worker. Management plans discussed with the patient, family and they are in agreement.  CODE STATUS: FULL  DVT Prophylaxis: heparin  TOTAL TIME TAKING CARE OF THIS PATIENT: 30 minutes.    POSSIBLE D/C IN 1-2 DAYS, DEPENDING ON CLINICAL CONDITION.  Note: This dictation was prepared with Dragon dictation along with smaller phrase technology. Any transcriptional errors that result from this process are unintentional.  Henreitta Leber M.D on 07/26/2016 at 3:18 PM  Between 7am to 6pm - Pager - [phone]  After 6pm go to [email or web address] - password [email or web address]  Office  [phone]  CC: Primary care physician; [name], MD

## 2016-07-26 NOTE — Progress Notes (Signed)
This RN rounded with attending MD and reported about patient's altered mental state. VSS. Continue to assess.

## 2016-07-26 NOTE — Progress Notes (Signed)
ANTIBIOTIC CONSULT NOTE - INITIAL  Pharmacy Consult for Levaquin Indication: pneumonia  Allergies  Allergen Reactions  . Ivp Dye [Iodinated Diagnostic Agents] Other (See Comments)    Pt denied    Patient Measurements: Height: 5\' 4"  (162.6 cm) Weight: 163 lb 14.4 oz (74.3 kg) IBW/kg (Calculated) : 59.2 Adjusted Body Weight:   Vital Signs: Temp: 97.6 F (36.4 C) (02/09 1220) Temp Source: Oral (02/09 1220) BP: 118/66 (02/09 1220) Pulse Rate: 85 (02/09 1220) Intake/Output from previous day: 02/08 0701 - 02/09 0700 In: -  Out: 13  Intake/Output from this shift: No intake/output data recorded.  Labs:  Recent Labs  07/25/16 0836 07/25/16 2324 07/25/16 2334 07/26/16 0403  WBC 12.3*  --  7.3 7.8  HGB 11.8*  --  11.4* 11.0*  PLT 210  --  202 200  CREATININE 13.04* 7.52*  --  8.26*   Estimated Creatinine Clearance: 8.2 mL/min (by C-G formula based on SCr of 8.26 mg/dL (H)).  Recent Labs  07/25/16 0544  [place] <4*     Microbiology: Recent Results (from the past 720 hour(s))  Culture, blood (routine x 2) Call MD if unable to obtain prior to antibiotics being given     Status: None (Preliminary result)   Collection Time: 07/24/16  2:44 AM  Result Value Ref Range Status   Specimen Description BLOOD RIGHT HAND  Final   Special Requests BOTTLES DRAWN AEROBIC AND ANAEROBIC BCAV  Final   Culture NO GROWTH 2 DAYS  Final   Report Status PENDING  Incomplete  MRSA PCR Screening     Status: None   Collection Time: 07/24/16  4:17 AM  Result Value Ref Range Status   MRSA by PCR NEGATIVE NEGATIVE Final    Comment:        The GeneXpert MRSA Assay (FDA approved for NASAL specimens only), is one component of a comprehensive MRSA colonization surveillance program. It is not intended to diagnose MRSA infection nor to guide or monitor treatment for MRSA infections.   Culture, blood (routine x 2) Call MD if unable to obtain prior to antibiotics being given     Status:  None (Preliminary result)   Collection Time: 07/24/16  8:06 AM  Result Value Ref Range Status   Specimen Description BLOOD R AC  Final   Special Requests BOTTLES DRAWN AEROBIC AND ANAEROBIC BCAV  Final   Culture NO GROWTH 2 DAYS  Final   Report Status PENDING  Incomplete  Culture, blood (Routine X 2) w Reflex to ID Panel     Status: None (Preliminary result)   Collection Time: 07/25/16 11:34 PM  Result Value Ref Range Status   Specimen Description BLOOD  R HAND  Final   Special Requests   Final    BOTTLES DRAWN AEROBIC AND ANAEROBIC  AER 6 ML ANA 6 ML   Culture NO GROWTH < 12 HOURS  Final   Report Status PENDING  Incomplete  Culture, blood (Routine X 2) w Reflex to ID Panel     Status: None (Preliminary result)   Collection Time: 07/25/16 11:34 PM  Result Value Ref Range Status   Specimen Description BLOOD  R HAND  Final   Special Requests   Final    BOTTLES DRAWN AEROBIC AND ANAEROBIC  AER 5 ML ANA 5 ML    Culture NO GROWTH < 12 HOURS  Final   Report Status PENDING  Incomplete    Medical History: Past Medical History:  Diagnosis Date  . Chronic diastolic  congestive heart failure ([place])   . Chronic disease anemia   . ESRD (end stage renal disease) on dialysis ([place])    "[place]; [facility]; [name]" (09/29/2014)  . GERD (gastroesophageal reflux disease)   . Gout   . High cholesterol   . History of blood transfusion    "related to anemia"  . History of stomach ulcers   . Hypertension   . Type II diabetes mellitus ([facility])     Medications:  Scheduled:  . allopurinol  100 mg Oral Daily  . aspirin EC  81 mg Oral Daily  . B-complex with vitamin C  1 tablet Oral Daily  . clopidogrel  75 mg Oral Q breakfast  . dextromethorphan  30 mg Oral BID   And  . guaiFENesin  600 mg Oral BID  . heparin  5,000 Units Subcutaneous Q8H  . insulin aspart  0-9 Units Subcutaneous TID WC  . levETIRAcetam  500 mg Oral Q12H  . levofloxacin (LEVAQUIN) IV  750 mg Intravenous Once   And  . [START ON  07/28/2016] levofloxacin (LEVAQUIN) IV  500 mg Intravenous Q48H  . Melatonin  2.5 mg Oral QHS  . mirtazapine  7.5 mg Oral QHS  . multivitamin  1 tablet Oral QHS  . pantoprazole  40 mg Oral Daily   Assessment: CrCl = 8.2 ml/min,  Pt on HD   Goal of Therapy:  resolution of infection  Plan:  Expected duration 7 days with resolution of temperature and/or normalization of WBC   Levaquin 750 mg IV X 1 to be given on 2/9 @ 16:00 followed by levaquin 500 mg IV Q48H to start 2/11 @ 16:00.   Johm Pfannenstiel D 07/26/2016,3:29 PM

## 2016-07-26 NOTE — Progress Notes (Signed)
[facility]  SUBJECTIVE: Events of past 24 hours noted. Does not verbally respond but appears to be aware of my presence. Blood pressure and pulse normal Head ct and labs normal.    Vitals:   07/26/16 0400 07/26/16 0500 07/26/16 0640 07/26/16 0758  BP: 97/62 (!) 84/64 (!) 115/51 112/62  Pulse: 96 94 93 87  Resp: 19 17 20 16   Temp: 99.8 F (37.7 C)  99.5 F (37.5 C) 97.8 F (36.6 C)  TempSrc:   Oral   SpO2: 94% 93% 95% 91%  Weight:   74.3 kg (163 lb 14.4 oz)   Height:        Intake/Output Summary (Last 24 hours) at 07/26/16 0826 Last data filed at 07/25/16 1735  Gross per 24 hour  Intake                0 ml  Output               13 ml  Net              -13 ml    LABS: Basic Metabolic Panel:  Recent Labs  07/24/16 1424  07/25/16 2324 07/26/16 0403  NA  --   < > 140 140  K  --   < > 4.3 4.5  CL  --   < > 98* 97*  CO2  --   < > 23 22  GLUCOSE  --   < > 86 97  BUN  --   < > 29* 34*  CREATININE  --   < > 7.52* 8.26*  CALCIUM  --   < > 8.3* 8.1*  MG 1.7  --   --   --   < > = values in this interval not displayed. Liver Function Tests:  Recent Labs  07/24/16 0806 07/25/16 0836  AST 575* 186*  ALT 414* 236*  ALKPHOS 347* 278*  BILITOT 3.7* 4.0*  PROT 8.9* 8.0  ALBUMIN 4.0 3.5  3.5   No results for input(s): LIPASE, AMYLASE in the last 72 hours. CBC:  Recent Labs  07/24/16 0806  07/25/16 2334 07/26/16 0403  WBC 20.1*  < > 7.3 7.8  NEUTROABS 18.1*  --  5.6  --   HGB 13.2  < > 11.4* 11.0*  HCT 41.5  < > 35.5* 35.4*  MCV 74.9*  < > 74.2* 75.3*  PLT 250  < > 202 200  < > = values in this interval not displayed. Cardiac Enzymes:  Recent Labs  07/24/16 0244 07/24/16 0806 07/24/16 1424  TROPONINI 0.04* 0.06* 0.03*   BNP: Invalid input(s): POCBNP D-Dimer: No results for input(s): DDIMER in the last 72 hours. Hemoglobin A1C: No results for input(s): HGBA1C in the last 72 hours. Fasting Lipid Panel: No results  for input(s): CHOL, HDL, LDLCALC, TRIG, CHOLHDL, LDLDIRECT in the last 72 hours. Thyroid Function Tests: No results for input(s): TSH, T4TOTAL, T3FREE, THYROIDAB in the last 72 hours.  Invalid input(s): FREET3 Anemia Panel: No results for input(s): VITAMINB12, FOLATE, FERRITIN, TIBC, IRON, RETICCTPCT in the last 72 hours.   Physical Exam: Blood pressure 112/62, pulse 87, temperature 97.8 F (36.6 C), resp. rate 16, height 5\' 4"  (1.626 m), weight 74.3 kg (163 lb 14.4 oz), SpO2 91 %.   Wt Readings from Last 1 Encounters:  07/26/16 74.3 kg (163 lb 14.4 oz)     General appearance: cooperative Resp: clear to auscultation bilaterally Cardio: regular rate and rhythm Neurologic:  Mental status: alertness: lethargic  TELEMETRY: Reviewed telemetry pt in nsr with intermitent pvcs and sinus bradycardia:  ASSESSMENT AND PLAN:  Active Problems:  CAD  Stable with no ischemia post cabg. Continue with current regimen  Renal Failure  On HD. Minimally responsive post HD .   Bradycardia  Improved. Will follow and avoid av nodal meds.      Teodoro Spray, MD, [facility] 07/26/2016 8:26 AM

## 2016-07-26 NOTE — Progress Notes (Signed)
After dialysis pt did not respond to voice or sternal rub. He had some shivering motion all over the body. Son stated " He does that at home sometimes, just doesn't respond and seems cold".  Pt febrile to 100.4 other VS wnl.  Called Dr. Jannifer Franklin who came to see pt. CBC, BMP and head CT done and all came back normal. Dr. Marcille Blanco reviewed the case and gave approval for transfer to floor.

## 2016-07-26 NOTE — Progress Notes (Signed)
[facility]  ROUNDING NOTE   Subjective:   Confused this morning. Wife at bedside.   Hemodialysis yesterday. Tolerated treatment well. Some confusion during treatment.   Objective:  Vital signs in last 24 hours:  Temp:  [97.6 F (36.4 C)-100.6 F (38.1 C)] 97.6 F (36.4 C) (02/09 1220) Pulse Rate:  [73-105] 85 (02/09 1220) Resp:  [13-25] 16 (02/09 0758) BP: (79-193)/(48-168) 118/66 (02/09 1220) SpO2:  [91 %-100 %] 94 % (02/09 1220) Weight:  [74.3 kg (163 lb 14.4 oz)] 74.3 kg (163 lb 14.4 oz) (02/09 0640)  Weight change:  Filed Weights   07/23/16 2142 07/26/16 0640  Weight: 63.5 kg (140 lb) 74.3 kg (163 lb 14.4 oz)    Intake/Output: I/O last 3 completed shifts: In: -  Out: 13 [Other:13]   Intake/Output this shift:  No intake/output data recorded.  Physical Exam: General: NAD,   Head: Normocephalic, atraumatic. Moist oral mucosal membranes  Eyes: Anicteric, PERRL  Neck: Supple, trachea midline  Lungs:  Clear to auscultation  Heart: Regular rate and rhythm  Abdomen:  Soft, nontender,   Extremities: no peripheral edema.  Neurologic: Nonfocal, moving all four extremities  Skin: No lesions  Access: Left forearm AVF    Basic Metabolic Panel:  Recent Labs Lab 07/24/16 0806 07/24/16 1424 07/24/16 2049 07/25/16 0836 07/25/16 2324 07/26/16 0403  NA 138  --  138 138 140 140  K 5.3*  --  5.5* 5.1 4.3 4.5  CL 95*  --  95* 96* 98* 97*  CO2 23  --  26 23 23 22   GLUCOSE 169*  --  140* 131* 86 97  BUN 54*  --  64* 72* 29* 34*  CREATININE 10.42*  --  11.68* 13.04* 7.52* 8.26*  CALCIUM 8.2*  --  7.6* 7.9* 8.3* 8.1*  MG  --  1.7  --   --   --   --     Liver Function Tests:  Recent Labs Lab 07/24/16 0806 07/25/16 0836  AST 575* 186*  ALT 414* 236*  ALKPHOS 347* 278*  BILITOT 3.7* 4.0*  PROT 8.9* 8.0  ALBUMIN 4.0 3.5  3.5   No results for input(s): LIPASE, AMYLASE in the last 168 hours.  Recent Labs Lab 07/24/16 2049  AMMONIA 24     CBC:  Recent Labs Lab 07/23/16 2156 07/24/16 0806 07/25/16 0836 07/25/16 2334 07/26/16 0403  WBC 14.6* 20.1* 12.3* 7.3 7.8  NEUTROABS 12.6* 18.1*  --  5.6  --   HGB 13.8 13.2 11.8* 11.4* 11.0*  HCT 43.6 41.5 36.7* 35.5* 35.4*  MCV 74.4* 74.9* 74.1* 74.2* 75.3*  PLT 285 250 210 202 200    Cardiac Enzymes:  Recent Labs Lab 07/23/16 2156 07/24/16 0244 07/24/16 0806 07/24/16 1424  TROPONINI 0.03* 0.04* 0.06* 0.03*    BNP: Invalid input(s): POCBNP  CBG:  Recent Labs Lab 07/25/16 1054 07/25/16 1944 07/25/16 2133 07/26/16 0734 07/26/16 1130  GLUCAP 124* 33 96 111* 119*    Microbiology: Results for orders placed or performed during the hospital encounter of 07/23/16  Culture, blood (routine x 2) Call MD if unable to obtain prior to antibiotics being given     Status: None (Preliminary result)   Collection Time: 07/24/16  2:44 AM  Result Value Ref Range Status   Specimen Description BLOOD RIGHT HAND  Final   Special Requests BOTTLES DRAWN AEROBIC AND ANAEROBIC BCAV  Final   Culture NO GROWTH 2 DAYS  Final   Report Status PENDING  Incomplete  MRSA PCR Screening     Status: None   Collection Time: 07/24/16  4:17 AM  Result Value Ref Range Status   MRSA by PCR NEGATIVE NEGATIVE Final    Comment:        The GeneXpert MRSA Assay (FDA approved for NASAL specimens only), is one component of a comprehensive MRSA colonization surveillance program. It is not intended to diagnose MRSA infection nor to guide or monitor treatment for MRSA infections.   Culture, blood (routine x 2) Call MD if unable to obtain prior to antibiotics being given     Status: None (Preliminary result)   Collection Time: 07/24/16  8:06 AM  Result Value Ref Range Status   Specimen Description BLOOD R AC  Final   Special Requests BOTTLES DRAWN AEROBIC AND ANAEROBIC BCAV  Final   Culture NO GROWTH 2 DAYS  Final   Report Status PENDING  Incomplete  Culture, blood (Routine X 2) w Reflex  to ID Panel     Status: None (Preliminary result)   Collection Time: 07/25/16 11:34 PM  Result Value Ref Range Status   Specimen Description BLOOD  R HAND  Final   Special Requests   Final    BOTTLES DRAWN AEROBIC AND ANAEROBIC  AER 6 ML ANA 6 ML   Culture NO GROWTH < 12 HOURS  Final   Report Status PENDING  Incomplete  Culture, blood (Routine X 2) w Reflex to ID Panel     Status: None (Preliminary result)   Collection Time: 07/25/16 11:34 PM  Result Value Ref Range Status   Specimen Description BLOOD  R HAND  Final   Special Requests   Final    BOTTLES DRAWN AEROBIC AND ANAEROBIC  AER 5 ML ANA 5 ML    Culture NO GROWTH < 12 HOURS  Final   Report Status PENDING  Incomplete    Coagulation Studies: No results for input(s): LABPROT, INR in the last 72 hours.  Urinalysis: No results for input(s): COLORURINE, LABSPEC, PHURINE, GLUCOSEU, HGBUR, BILIRUBINUR, KETONESUR, PROTEINUR, UROBILINOGEN, NITRITE, LEUKOCYTESUR in the last 72 hours.  Invalid input(s): APPERANCEUR    Imaging: Ct Head Wo Contrast  Result Date: 07/26/2016 CLINICAL DATA:  Altered mental status. Pneumonia. Bradycardia and hypotension with decreased responsiveness. EXAM: CT HEAD WITHOUT CONTRAST TECHNIQUE: Contiguous axial images were obtained from the base of the skull through the vertex without intravenous contrast. COMPARISON:  02/11/2015 FINDINGS: Brain: Diffuse cerebral atrophy. Ventricular dilatation consistent with central atrophy. Low-attenuation changes in the deep white matter consistent with small vessel ischemia. No mass effect or midline shift. No abnormal extra-axial fluid collections. Gray-white matter junctions are distinct. Basal cisterns are not effaced. No acute intracranial hemorrhage. Vascular: Prominent vascular calcifications are present. Skull: Normal. Negative for fracture or focal lesion. Sinuses/Orbits: No acute finding. Other: No significant changes since previous study. IMPRESSION: No acute  intracranial abnormalities. Chronic atrophy and small vessel ischemic changes. Electronically Signed   By: Lucienne Capers M.D.   On: 07/26/2016 01:49   US Abdomen Limited Ruq  Result Date: 07/25/2016 CLINICAL DATA:  Elevated liver function studies. History of chronic renal failure and right nephrectomy with failed transplant. EXAM: US ABDOMEN LIMITED - RIGHT UPPER QUADRANT COMPARISON:  Noncontrast abdominal CT scan of December 12, 2014 FINDINGS: Gallbladder: The gallbladder is adequately distended. No echogenic mobile shadowing stones are observed. There is small amount of sludge. There is no gallbladder wall thickening, pericholecystic fluid, or positive sonographic Murphy's sign. Common bile duct: Diameter: 8.4 mm tapering to  6.7 mm at the level of the pancreatic head. Liver: The hepatic echotexture is normal. There is no focal mass nor ductal dilation. IMPRESSION: Small amount of gallbladder sludge with no sonographic evidence of acute cholecystitis. If chronic cholecystitis is suspected clinically, a nuclear medicine hepatobiliary scan with gallbladder ejection fraction determination may be useful. Normal hepatic echotexture with no parenchymal abnormality observed. Minimal dilation of the common bile duct which does exhibits a normal tapering caliber. Electronically Signed   By: David  Martinique M.D.   On: 07/25/2016 10:31     Medications:    . allopurinol  100 mg Oral Daily  . aspirin EC  81 mg Oral Daily  . B-complex with vitamin C  1 tablet Oral Daily  . clopidogrel  75 mg Oral Q breakfast  . dextromethorphan  30 mg Oral BID   And  . guaiFENesin  600 mg Oral BID  . heparin  5,000 Units Subcutaneous Q8H  . insulin aspart  0-9 Units Subcutaneous TID WC  . levETIRAcetam  500 mg Oral Q12H  . Melatonin  2.5 mg Oral QHS  . mirtazapine  7.5 mg Oral QHS  . multivitamin  1 tablet Oral QHS  . pantoprazole  40 mg Oral Daily   cyclobenzaprine, ipratropium-albuterol, morphine injection, nitroGLYCERIN,  ondansetron (ZOFRAN) IV, oxyCODONE  Assessment/ Plan:  [name] is a 66 y.o. Asian [place]) male with End stage kidney disease status post transplant now on hemodialysis., hypertension, coronary artery disease, meningitis, hyperlipidemia, gout, diabetes mellitus type II, admitted to [facility] on 07/23/2016 for Hospital-acquired pneumonia [J18.9] Thoracic back pain, unspecified back pain laterality, unspecified chronicity [M54.6]   TTS [place]. Left AVF  1. End Stage Renal Disease:  - continue TTS schedule.   2. Hypertension: hypotensive.  - holding losartan  3. Anemia of chronic kidney disease: hemoglobin 11 - hold epo.   4. Secondary Hyperparathyroidism: outpatient PTH, phos and calcium at goal. Not currently on any binders.   5. Pneumonia: appreciate pulmonary input  6. Encephalopathy: MRI pending. CT negative. Appreciate neuro input.     LOS: 2 [name] 2/9/201812:50 PM

## 2016-07-26 NOTE — Consult Note (Signed)
Reason for Consult:Altered mental status Referring Physician: Verdell Carmine  CC: Altered mental status  HPI: [name] is an 66 y.o. male with multiple medical problems including seizures on Keppra who was admitted early on 2/7 with PNA and placed on Maxipime and Vancomycin.  Later in the day became minimally responsive, hypotensive and bradycardic requiring transfer to the ICU.  Had hemodialysis with another episode of hypotension.  Was stable and transferred back to the floor.  This AM did not respond to voice or sternal rub and had some shivering after dialysis.  Son reported he had been having these episodes prior to admission at home.  Patient now some improved but not yet back at baseline.    Past Medical History:  Diagnosis Date  . Chronic diastolic congestive heart failure ([place])   . Chronic disease anemia   . ESRD (end stage renal disease) on dialysis ([place])    "[place]; [place]; [name]" (09/29/2014)  . GERD (gastroesophageal reflux disease)   . Gout   . High cholesterol   . History of blood transfusion    "related to anemia"  . History of stomach ulcers   . Hypertension   . Type II diabetes mellitus ([place])     Past Surgical History:  Procedure Laterality Date  . ARTERIOVENOUS GRAFT PLACEMENT Left ~ 1996  . CARDIAC CATHETERIZATION  09/29/2014   "[place]"  . CARDIAC CATHETERIZATION N/A 02/26/2016   Procedure: Left Heart Cath and Coronary Angiography Possible PCI;  Surgeon: [name], MD;  Location: [facility];  Service: Cardiovascular;  Laterality: N/A;  . KIDNEY TRANSPLANT Right 2004  . NEPHRECTOMY TRANSPLANTED ORGAN  2015  . PERCUTANEOUS CORONARY STENT INTERVENTION (PCI-S) N/A 09/30/2014   Procedure: PERCUTANEOUS CORONARY STENT INTERVENTION (PCI-S);  Surgeon: [name], MD;  Location: [facility] C[facility] LAB;  Service: Cardiovascular;  Laterality: N/A;  . PERITONEAL CATHETER INSERTION  02/2014  . PERITONEAL CATHETER REMOVAL  08/2014  . THROMBECTOMY / ARTERIOVENOUS GRAFT  REVISION  2015    Family History  Problem Relation Age of Onset  . Hypertension Mother   . Stroke Mother   . Hypertension Father   . Diabetes Mellitus II Father   . Asthma Father     Social History:  reports that he has quit smoking. His smoking use included Cigarettes. He has never used smokeless tobacco. He reports that he does not drink alcohol or use drugs.  Allergies  Allergen Reactions  . Ivp Dye [Iodinated Diagnostic Agents] Other (See Comments)    Pt denied    Medications:  I have reviewed the patient's current medications. Prior to Admission:  Prescriptions Prior to Admission  Medication Sig Dispense Refill Last Dose  . acetaminophen (TYLENOL) 325 MG tablet Take 2 tablets (650 mg total) by mouth every 6 (six) hours as needed for mild pain (or Fever >/= 101). 30 tablet 0 07/23/2016 at Unknown time  . albuterol (PROVENTIL) (2.5 MG/3ML) 0.083% nebulizer solution Take 2.5 mg by nebulization every 6 (six) hours as needed for wheezing or shortness of breath.     . allopurinol (ZYLOPRIM) 100 MG tablet Take 100 mg by mouth daily.  10 07/23/2016 at Unknown time  . aspirin 81 MG EC tablet Take 81 mg by mouth daily.  10 07/23/2016 at Unknown time  . atorvastatin (LIPITOR) 40 MG tablet Take 1 tablet (40 mg total) by mouth daily at 6 PM. 30 tablet 0 07/23/2016 at Unknown time  . B Complex-C (B-COMPLEX WITH VITAMIN C) tablet Take 1 tablet by mouth  daily.   07/23/2016 at Unknown time  . clopidogrel (PLAVIX) 75 MG tablet Take 1 tablet (75 mg total) by mouth daily with breakfast. 30 tablet 11 07/23/2016 at Unknown time  . epoetin alfa (EPOGEN,PROCRIT) 4000 UNIT/ML injection Inject 4,000 Units into the vein.     . heparin 100-0.45 UNIT/ML-% infusion Inject 1,000 Units/hr into the vein continuous. 250 mL 0   . levETIRAcetam (KEPPRA) 500 MG tablet Take 500 mg by mouth every 12 (twelve) hours.   07/23/2016 at Unknown time  . Melatonin 3 MG TABS Take 3 mg by mouth at bedtime.   Past Week at Unknown time  .  mirtazapine (REMERON) 7.5 MG tablet Take 7.5 mg by mouth at bedtime.   07/23/2016 at Unknown time  . multivitamin (RENA-VIT) TABS tablet Take 1 tablet by mouth at bedtime. 30 tablet 3 07/23/2016 at Unknown time  . nitroGLYCERIN (NITROGLYN) 2 % ointment Apply 0.5 inches topically every 6 (six) hours. 30 g 0   . nitroGLYCERIN (NITROSTAT) 0.4 MG SL tablet Place 1 tablet (0.4 mg total) under the tongue every 5 (five) minutes x 3 doses as needed for chest pain. 25 tablet 12 unknown at unknown  . pantoprazole (PROTONIX) 40 MG tablet Take 40 mg by mouth daily.   07/23/2016 at Unknown time  . carvedilol (COREG) 3.125 MG tablet Take 3.125 mg by mouth 2 (two) times daily with a meal.   Not Taking at Unknown time  . losartan (COZAAR) 100 MG tablet Take 1 tablet (100 mg total) by mouth daily. (Patient not taking: Reported on 07/24/2016) 30 tablet 0 Not Taking at Unknown time   Scheduled: . allopurinol  100 mg Oral Daily  . aspirin EC  81 mg Oral Daily  . B-complex with vitamin C  1 tablet Oral Daily  . clopidogrel  75 mg Oral Q breakfast  . dextromethorphan  30 mg Oral BID   And  . guaiFENesin  600 mg Oral BID  . heparin  5,000 Units Subcutaneous Q8H  . insulin aspart  0-9 Units Subcutaneous TID WC  . levETIRAcetam  500 mg Oral Q12H  . Melatonin  2.5 mg Oral QHS  . mirtazapine  7.5 mg Oral QHS  . multivitamin  1 tablet Oral QHS  . pantoprazole  40 mg Oral Daily    ROS: Unable to provide due to mental status  Physical Examination: Blood pressure 112/62, pulse 87, temperature 97.8 F (36.6 C), resp. rate 16, height 5\' 4"  (1.626 m), weight 74.3 kg (163 lb 14.4 oz), SpO2 91 %.  HEENT-  Normocephalic, no lesions, without obvious abnormality.  Normal external eye and conjunctiva.  Normal TM's bilaterally.  Normal auditory canals and external ears. Normal external nose, mucus membranes and septum.  Normal pharynx. Cardiovascular- S1, S2 normal, pulses palpable throughout   Lungs- chest clear, no wheezing,  rales, normal symmetric air entry Abdomen- soft, non-tender; bowel sounds normal; no masses,  no organomegaly Extremities- no edema Lymph-no adenopathy palpable Musculoskeletal-pain on actively lifting legs Skin-warm and dry, no hyperpigmentation, vitiligo, or suspicious lesions  Neurological Examination   Mental Status: Alert, not responding to all questions even in native language.  Does respond to some simple questioning.  Does not follow commands.   Cranial Nerves: II: Discs flat bilaterally; Blinks to bilateral confrontation, pupils equal, round, reactive to light and accommodation III,IV, VI: ptosis not present, extra-ocular motions intact bilaterally V,VII: grimace symmetric, facial light touch sensation normal bilaterally VIII: hearing normal bilaterally IX,X: gag reflex present XI: unable  to test XII: unable to test Motor: Moves all extremities weakly.  Some generalized myoclonus noted. Sensory: Responds to light noxious stimuli in all extremities except the LLE Deep Tendon Reflexes: 1+ throughout with absent AJ's bilaterally Plantars: Right: downgoing   Left: upgoing Cerebellar: Unable to test Gait: not tested due to safety concerns    Laboratory Studies:   Basic Metabolic Panel:  Recent Labs Lab 07/24/16 0806 07/24/16 1424 07/24/16 2049 07/25/16 0836 07/25/16 2324 07/26/16 0403  NA 138  --  138 138 140 140  K 5.3*  --  5.5* 5.1 4.3 4.5  CL 95*  --  95* 96* 98* 97*  CO2 23  --  26 23 23 22   GLUCOSE 169*  --  140* 131* 86 97  BUN 54*  --  64* 72* 29* 34*  CREATININE 10.42*  --  11.68* 13.04* 7.52* 8.26*  CALCIUM 8.2*  --  7.6* 7.9* 8.3* 8.1*  MG  --  1.7  --   --   --   --     Liver Function Tests:  Recent Labs Lab 07/24/16 0806 07/25/16 0836  AST 575* 186*  ALT 414* 236*  ALKPHOS 347* 278*  BILITOT 3.7* 4.0*  PROT 8.9* 8.0  ALBUMIN 4.0 3.5  3.5   No results for input(s): LIPASE, AMYLASE in the last 168 hours.  Recent Labs Lab  07/24/16 2049  AMMONIA 24    CBC:  Recent Labs Lab 07/23/16 2156 07/24/16 0806 07/25/16 0836 07/25/16 2334 07/26/16 0403  WBC 14.6* 20.1* 12.3* 7.3 7.8  NEUTROABS 12.6* 18.1*  --  5.6  --   HGB 13.8 13.2 11.8* 11.4* 11.0*  HCT 43.6 41.5 36.7* 35.5* 35.4*  MCV 74.4* 74.9* 74.1* 74.2* 75.3*  PLT 285 250 210 202 200    Cardiac Enzymes:  Recent Labs Lab 07/23/16 2156 07/24/16 0244 07/24/16 0806 07/24/16 1424  TROPONINI 0.03* 0.04* 0.06* 0.03*    BNP: Invalid input(s): POCBNP  CBG:  Recent Labs Lab 07/25/16 0654 07/25/16 1054 07/25/16 1944 07/25/16 2133 07/26/16 0734  GLUCAP 128* 124* 52 96 111*    Microbiology: Results for orders placed or performed during the hospital encounter of 07/23/16  Culture, blood (routine x 2) Call MD if unable to obtain prior to antibiotics being given     Status: None (Preliminary result)   Collection Time: 07/24/16  2:44 AM  Result Value Ref Range Status   Specimen Description BLOOD RIGHT HAND  Final   Special Requests BOTTLES DRAWN AEROBIC AND ANAEROBIC BCAV  Final   Culture NO GROWTH 2 DAYS  Final   Report Status PENDING  Incomplete  MRSA PCR Screening     Status: None   Collection Time: 07/24/16  4:17 AM  Result Value Ref Range Status   MRSA by PCR NEGATIVE NEGATIVE Final    Comment:        The GeneXpert MRSA Assay (FDA approved for NASAL specimens only), is one component of a comprehensive MRSA colonization surveillance program. It is not intended to diagnose MRSA infection nor to guide or monitor treatment for MRSA infections.   Culture, blood (routine x 2) Call MD if unable to obtain prior to antibiotics being given     Status: None (Preliminary result)   Collection Time: 07/24/16  8:06 AM  Result Value Ref Range Status   Specimen Description BLOOD R AC  Final   Special Requests BOTTLES DRAWN AEROBIC AND ANAEROBIC BCAV  Final   Culture NO GROWTH 2 DAYS  Final   Report Status PENDING  Incomplete  Culture,  blood (Routine X 2) w Reflex to ID Panel     Status: None (Preliminary result)   Collection Time: 07/25/16 11:34 PM  Result Value Ref Range Status   Specimen Description BLOOD  R HAND  Final   Special Requests   Final    BOTTLES DRAWN AEROBIC AND ANAEROBIC  AER 6 ML ANA 6 ML   Culture NO GROWTH < 12 HOURS  Final   Report Status PENDING  Incomplete  Culture, blood (Routine X 2) w Reflex to ID Panel     Status: None (Preliminary result)   Collection Time: 07/25/16 11:34 PM  Result Value Ref Range Status   Specimen Description BLOOD  R HAND  Final   Special Requests   Final    BOTTLES DRAWN AEROBIC AND ANAEROBIC  AER 5 ML ANA 5 ML    Culture NO GROWTH < 12 HOURS  Final   Report Status PENDING  Incomplete    Coagulation Studies: No results for input(s): LABPROT, INR in the last 72 hours.  Urinalysis: No results for input(s): COLORURINE, LABSPEC, PHURINE, GLUCOSEU, HGBUR, BILIRUBINUR, KETONESUR, PROTEINUR, UROBILINOGEN, NITRITE, LEUKOCYTESUR in the last 168 hours.  Invalid input(s): APPERANCEUR  Lipid Panel:     Component Value Date/Time   CHOL 115 09/30/2014 0640   CHOL 103 08/06/2013 0551   TRIG 79 09/30/2014 0640   TRIG 191 08/06/2013 0551   HDL 30 (L) 09/30/2014 0640   HDL 32 (L) 08/06/2013 0551   CHOLHDL 3.8 09/30/2014 0640   VLDL 16 09/30/2014 0640   VLDL 38 08/06/2013 0551   LDLCALC 69 09/30/2014 0640   LDLCALC 33 08/06/2013 0551    HgbA1C:  Lab Results  Component Value Date   HGBA1C 6.4 (H) 02/26/2016    Urine Drug Screen:  No results found for: LABOPIA, COCAINSCRNUR, LABBENZ, AMPHETMU, THCU, LABBARB  Alcohol Level: No results for input(s): ETH in the last 168 hours.  Other results: EKG: junctional bradycardia at 44bpm.  Imaging: Ct Head Wo Contrast  Result Date: 07/26/2016 CLINICAL DATA:  Altered mental status. Pneumonia. Bradycardia and hypotension with decreased responsiveness. EXAM: CT HEAD WITHOUT CONTRAST TECHNIQUE: Contiguous axial images were obtained  from the base of the skull through the vertex without intravenous contrast. COMPARISON:  02/11/2015 FINDINGS: Brain: Diffuse cerebral atrophy. Ventricular dilatation consistent with central atrophy. Low-attenuation changes in the deep white matter consistent with small vessel ischemia. No mass effect or midline shift. No abnormal extra-axial fluid collections. Gray-white matter junctions are distinct. Basal cisterns are not effaced. No acute intracranial hemorrhage. Vascular: Prominent vascular calcifications are present. Skull: Normal. Negative for fracture or focal lesion. Sinuses/Orbits: No acute finding. Other: No significant changes since previous study. IMPRESSION: No acute intracranial abnormalities. Chronic atrophy and small vessel ischemic changes. Electronically Signed   By: Lucienne Capers M.D.   On: 07/26/2016 01:49   US Abdomen Limited Ruq  Result Date: 07/25/2016 CLINICAL DATA:  Elevated liver function studies. History of chronic renal failure and right nephrectomy with failed transplant. EXAM: US ABDOMEN LIMITED - RIGHT UPPER QUADRANT COMPARISON:  Noncontrast abdominal CT scan of December 12, 2014 FINDINGS: Gallbladder: The gallbladder is adequately distended. No echogenic mobile shadowing stones are observed. There is small amount of sludge. There is no gallbladder wall thickening, pericholecystic fluid, or positive sonographic Murphy's sign. Common bile duct: Diameter: 8.4 mm tapering to 6.7 mm at the level of the pancreatic head. Liver: The hepatic echotexture is normal. There is  no focal mass nor ductal dilation. IMPRESSION: Small amount of gallbladder sludge with no sonographic evidence of acute cholecystitis. If chronic cholecystitis is suspected clinically, a nuclear medicine hepatobiliary scan with gallbladder ejection fraction determination may be useful. Normal hepatic echotexture with no parenchymal abnormality observed. Minimal dilation of the common bile duct which does exhibits a normal  tapering caliber. Electronically Signed   By: David  Martinique M.D.   On: 07/25/2016 10:31     Assessment/Plan: 66 year old male with multiple medical problems admitted with PNA.  Has multiple other metabolic issues as well and has had low grade temps, hypotension and bradycardia.  Now with episodes when he is poorly responsive and appears per his records to have had a fluctuating mental status.  Although this may be a delirium related to a metabolic encephalopathy, can not rule out the possibility that this may be a seizure presentation.  Head CT reviewed and shows no acute changes.  Will rule out the possibility of further intracerebral pathology.    Recommendations: 1.  EEG 2.  MRI of the brain  3.  Agree with continued addressing of medical issues.   Alexis Goodell, MD Neurology [phone] 07/26/2016, 10:44 AM

## 2016-07-27 LAB — RENAL FUNCTION PANEL
Albumin: 3.2 g/dL — ABNORMAL LOW (ref 3.5–5.0)
Anion gap: 20 — ABNORMAL HIGH (ref 5–15)
BUN: 59 mg/dL — ABNORMAL HIGH (ref 6–20)
CO2: 24 mmol/L (ref 22–32)
CREATININE: 11.85 mg/dL — AB (ref 0.61–1.24)
Calcium: 8.2 mg/dL — ABNORMAL LOW (ref 8.9–10.3)
Chloride: 98 mmol/L — ABNORMAL LOW (ref 101–111)
GFR calc Af Amer: 4 mL/min — ABNORMAL LOW (ref >60)
GFR calc non Af Amer: 4 mL/min — ABNORMAL LOW (ref >60)
GLUCOSE: 105 mg/dL — AB (ref 65–99)
Phosphorus: 4.9 mg/dL — ABNORMAL HIGH (ref 2.5–4.6)
Potassium: 4.7 mmol/L (ref 3.5–5.1)
SODIUM: 142 mmol/L (ref 135–145)

## 2016-07-27 LAB — CBC
HCT: 35.2 % — ABNORMAL LOW (ref 40.0–52.0)
Hemoglobin: 11.4 g/dL — ABNORMAL LOW (ref 13.0–18.0)
MCH: 23.9 pg — AB (ref 26.0–34.0)
MCHC: 32.4 g/dL (ref 32.0–36.0)
MCV: 73.8 fL — ABNORMAL LOW (ref 80.0–100.0)
PLATELETS: 196 10*3/uL (ref 150–440)
RBC: 4.78 MIL/uL (ref 4.40–5.90)
RDW: 19.7 % — AB (ref 11.5–14.5)
WBC: 7.5 10*3/uL (ref 3.8–10.6)

## 2016-07-27 LAB — GLUCOSE, CAPILLARY
GLUCOSE-CAPILLARY: 104 mg/dL — AB (ref 65–99)
GLUCOSE-CAPILLARY: 87 mg/dL (ref 65–99)
Glucose-Capillary: 85 mg/dL (ref 65–99)

## 2016-07-27 NOTE — Progress Notes (Signed)
[facility]  ROUNDING NOTE   Subjective:   Seen and examined on hemodialysis. Continues to be confused. Tolerating dialysis treatment well. UF goal of 1.5 litres.   Objective:  Vital signs in last 24 hours:  Temp:  [96.9 F (36.1 C)-97.8 F (36.6 C)] 97 F (36.1 C) (02/10 0920) Pulse Rate:  [53-85] 59 (02/10 1000) Resp:  [13-17] 13 (02/10 1000) BP: (118-155)/(66-72) 142/69 (02/10 1000) SpO2:  [93 %-97 %] 97 % (02/10 1000) Weight:  [74.3 kg (163 lb 14.4 oz)] 74.3 kg (163 lb 14.4 oz) (02/10 0920)  Weight change:  Filed Weights   07/23/16 2142 07/26/16 0640 07/27/16 0920  Weight: 63.5 kg (140 lb) 74.3 kg (163 lb 14.4 oz) 74.3 kg (163 lb 14.4 oz)    Intake/Output: No intake/output data recorded.   Intake/Output this shift:  No intake/output data recorded.  Physical Exam: General: NAD, laying in bed  Head: Normocephalic, atraumatic. Moist oral mucosal membranes  Eyes: Anicteric, PERRL  Neck: Supple, trachea midline  Lungs:  Clear to auscultation  Heart: Regular rate and rhythm  Abdomen:  Soft, nontender,   Extremities: no peripheral edema.  Neurologic: Not following commands  Skin: No lesions  Access: Left forearm AVF    Basic Metabolic Panel:  Recent Labs Lab 07/24/16 0806 07/24/16 1424 07/24/16 2049 07/25/16 0836 07/25/16 2324 07/26/16 0403  NA 138  --  138 138 140 140  K 5.3*  --  5.5* 5.1 4.3 4.5  CL 95*  --  95* 96* 98* 97*  CO2 23  --  26 23 23 22   GLUCOSE 169*  --  140* 131* 86 97  BUN 54*  --  64* 72* 29* 34*  CREATININE 10.42*  --  11.68* 13.04* 7.52* 8.26*  CALCIUM 8.2*  --  7.6* 7.9* 8.3* 8.1*  MG  --  1.7  --   --   --   --     Liver Function Tests:  Recent Labs Lab 07/24/16 0806 07/25/16 0836  AST 575* 186*  ALT 414* 236*  ALKPHOS 347* 278*  BILITOT 3.7* 4.0*  PROT 8.9* 8.0  ALBUMIN 4.0 3.5  3.5   No results for input(s): LIPASE, AMYLASE in the last 168 hours.  Recent Labs Lab 07/24/16 2049  AMMONIA 24     CBC:  Recent Labs Lab 07/23/16 2156 07/24/16 0806 07/25/16 0836 07/25/16 2334 07/26/16 0403 07/27/16 0937  WBC 14.6* 20.1* 12.3* 7.3 7.8 7.5  NEUTROABS 12.6* 18.1*  --  5.6  --   --   HGB 13.8 13.2 11.8* 11.4* 11.0* 11.4*  HCT 43.6 41.5 36.7* 35.5* 35.4* 35.2*  MCV 74.4* 74.9* 74.1* 74.2* 75.3* 73.8*  PLT 285 250 210 202 200 196    Cardiac Enzymes:  Recent Labs Lab 07/23/16 2156 07/24/16 0244 07/24/16 0806 07/24/16 1424  TROPONINI 0.03* 0.04* 0.06* 0.03*    BNP: Invalid input(s): POCBNP  CBG:  Recent Labs Lab 07/26/16 0734 07/26/16 1130 07/26/16 1633 07/26/16 2115 07/27/16 0745  GLUCAP 111* 119* 113* 120* 104*    Microbiology: Results for orders placed or performed during the hospital encounter of 07/23/16  Culture, blood (routine x 2) Call MD if unable to obtain prior to antibiotics being given     Status: None (Preliminary result)   Collection Time: 07/24/16  2:44 AM  Result Value Ref Range Status   Specimen Description BLOOD RIGHT HAND  Final   Special Requests BOTTLES DRAWN AEROBIC AND ANAEROBIC BCAV  Final   Culture NO  GROWTH 3 DAYS  Final   Report Status PENDING  Incomplete  MRSA PCR Screening     Status: None   Collection Time: 07/24/16  4:17 AM  Result Value Ref Range Status   MRSA by PCR NEGATIVE NEGATIVE Final    Comment:        The GeneXpert MRSA Assay (FDA approved for NASAL specimens only), is one component of a comprehensive MRSA colonization surveillance program. It is not intended to diagnose MRSA infection nor to guide or monitor treatment for MRSA infections.   Culture, blood (routine x 2) Call MD if unable to obtain prior to antibiotics being given     Status: None (Preliminary result)   Collection Time: 07/24/16  8:06 AM  Result Value Ref Range Status   Specimen Description BLOOD R AC  Final   Special Requests BOTTLES DRAWN AEROBIC AND ANAEROBIC BCAV  Final   Culture NO GROWTH 3 DAYS  Final   Report Status PENDING   Incomplete  Culture, blood (Routine X 2) w Reflex to ID Panel     Status: None (Preliminary result)   Collection Time: 07/25/16 11:34 PM  Result Value Ref Range Status   Specimen Description BLOOD  R HAND  Final   Special Requests   Final    BOTTLES DRAWN AEROBIC AND ANAEROBIC  AER 6 ML ANA 6 ML   Culture NO GROWTH 2 DAYS  Final   Report Status PENDING  Incomplete  Culture, blood (Routine X 2) w Reflex to ID Panel     Status: None (Preliminary result)   Collection Time: 07/25/16 11:34 PM  Result Value Ref Range Status   Specimen Description BLOOD  R HAND  Final   Special Requests   Final    BOTTLES DRAWN AEROBIC AND ANAEROBIC  AER 5 ML ANA 5 ML    Culture NO GROWTH 2 DAYS  Final   Report Status PENDING  Incomplete    Coagulation Studies: No results for input(s): LABPROT, INR in the last 72 hours.  Urinalysis: No results for input(s): COLORURINE, LABSPEC, PHURINE, GLUCOSEU, HGBUR, BILIRUBINUR, KETONESUR, PROTEINUR, UROBILINOGEN, NITRITE, LEUKOCYTESUR in the last 72 hours.  Invalid input(s): APPERANCEUR    Imaging: Ct Head Wo Contrast  Result Date: 07/26/2016 CLINICAL DATA:  Altered mental status. Pneumonia. Bradycardia and hypotension with decreased responsiveness. EXAM: CT HEAD WITHOUT CONTRAST TECHNIQUE: Contiguous axial images were obtained from the base of the skull through the vertex without intravenous contrast. COMPARISON:  02/11/2015 FINDINGS: Brain: Diffuse cerebral atrophy. Ventricular dilatation consistent with central atrophy. Low-attenuation changes in the deep white matter consistent with small vessel ischemia. No mass effect or midline shift. No abnormal extra-axial fluid collections. Gray-white matter junctions are distinct. Basal cisterns are not effaced. No acute intracranial hemorrhage. Vascular: Prominent vascular calcifications are present. Skull: Normal. Negative for fracture or focal lesion. Sinuses/Orbits: No acute finding. Other: No significant changes since  previous study. IMPRESSION: No acute intracranial abnormalities. Chronic atrophy and small vessel ischemic changes. Electronically Signed   By: Lucienne Capers M.D.   On: 07/26/2016 01:49   Mr Brain Wo Contrast  Result Date: 07/26/2016 CLINICAL DATA:  66 year old male with acute encephalopathy, unresponsive status post admission and starting on treatment for pneumonia. Hypotensive episodes. Initial encounter. EXAM: MRI HEAD WITHOUT CONTRAST TECHNIQUE: Multiplanar, multiecho pulse sequences of the brain and surrounding structures were obtained without intravenous contrast. COMPARISON:  Head CTs without contrast 0134 hours today and earlier. Limited brain MRI 12/13/2014. FINDINGS: Study is mildly degraded by motion artifact  despite repeated imaging attempts. Brain: No restricted diffusion to suggest acute infarction. No midline shift, mass effect, evidence of mass lesion, ventriculomegaly, extra-axial collection or acute intracranial hemorrhage. Cervicomedullary junction and pituitary are within normal limits. 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 Probable small chronic lacunar infarct in the posterior right pons. Patchy nonspecific bilateral cerebral white matter T2 and FLAIR hyperintensity. No cortical encephalomalacia or chronic cerebral blood products are evident. Deep gray matter nuclei appear normal. Vascular: Major intracranial vascular flow voids appear preserved. Skull and upper cervical spine: Upper cervical spine poorly visualized. Skull bone marrow signal within normal limits. Sinuses/Orbits: Negative orbit soft tissues. Trace paranasal sinus mucosal thickening. Other: Visible internal auditory structures appear normal. Mastoids are clear. Negative scalp soft tissues. IMPRESSION: 1.  No acute intracranial abnormality. 2. Mild to moderate chronic small vessel ischemia in the brainstem and cerebellum. Less pronounced chronic cerebral white matter changes probably also are small vessel  disease related. Electronically Signed   By: Genevie Ann M.D.   On: 07/26/2016 13:36     Medications:    . allopurinol  100 mg Oral Daily  . aspirin EC  81 mg Oral Daily  . B-complex with vitamin C  1 tablet Oral Daily  . clopidogrel  75 mg Oral Q breakfast  . dextromethorphan  30 mg Oral BID   And  . guaiFENesin  600 mg Oral BID  . heparin  5,000 Units Subcutaneous Q8H  . insulin aspart  0-9 Units Subcutaneous TID WC  . levETIRAcetam  1,000 mg Intravenous Q12H  . [START ON 07/28/2016] levofloxacin (LEVAQUIN) IV  500 mg Intravenous Q48H  . Melatonin  2.5 mg Oral QHS  . mirtazapine  7.5 mg Oral QHS  . multivitamin  1 tablet Oral QHS  . pantoprazole  40 mg Oral Daily   cyclobenzaprine, ipratropium-albuterol, morphine injection, nitroGLYCERIN, ondansetron (ZOFRAN) IV, oxyCODONE  Assessment/ Plan:  [name] is a 66 y.o. Asian [place]) male with End stage kidney disease status post transplant now on hemodialysis., hypertension, coronary artery disease, meningitis, hyperlipidemia, gout, diabetes mellitus type II, admitted to [facility] on 07/23/2016 for Hospital-acquired pneumonia [J18.9] Thoracic back pain, unspecified back pain laterality, unspecified chronicity [M54.6]   [name]. Left AVF  1. End Stage Renal Disease: seen and examined on hemodialysis. Tolerating treatment well. UF goal of 1.5 litres.  - continue David schedule.   2. Hypertension: blood pressure now at goal. Hypotensive on admssion.  - holding losartan and carvedilol  3. Anemia of chronic kidney disease: hemoglobin 11.4 - hold epo.   4. Secondary Hyperparathyroidism: outpatient PTH, phos and calcium at goal. Not currently on any binders.   5. Pneumonia: appreciate pulmonary input - levofloxacin - renally dosed.   6. Encephalopathy: MRI negative. CT negative. Appreciate neuro input. EEG pending    LOS: 3 Merilee Wible 2/10/201810:31 AM

## 2016-07-27 NOTE — Progress Notes (Signed)
Hemodialysis treatment started without complications.Access secured/visible.Dialyzing in bed.

## 2016-07-27 NOTE — Progress Notes (Signed)
Post hd tx 

## 2016-07-27 NOTE — Progress Notes (Signed)
Pre-hd tx 

## 2016-07-27 NOTE — Progress Notes (Signed)
Post Hemodialysis: Patient tolerated 3hours of treatment with 1.5net fluid removal.Stable blood pressures during tx.Hemastasis achieved,sites dressed with gauze/taped.

## 2016-07-27 NOTE — Progress Notes (Signed)
[name] at [place] NAME: [name]    MR#:  [id number]  DATE OF BIRTH:  10/14/1950  SUBJECTIVE:   Patient seen and hemodialysis and tolerating treatment well. Remains confused. Having some tremors but afebrile.  REVIEW OF SYSTEMS:   Review of Systems  Unable to perform ROS: Mental acuity   Tolerating Diet: yes Tolerating PT: Home health PT  DRUG ALLERGIES:   Allergies  Allergen Reactions  . Ivp Dye [Iodinated Diagnostic Agents] Other (See Comments)    Pt denied    VITALS:  Blood pressure 111/63, pulse 70, temperature 97.9 F (36.6 C), temperature source Oral, resp. rate 20, height 5\' 4"  (1.626 m), weight 72.2 kg (159 lb 2.8 oz), SpO2 96 %.  PHYSICAL EXAMINATION:   Physical Exam  GENERAL:  66 y.o.-year-old patient lying in the bed encephalopathic EYES: Pupils equal, round, reactive to light and accommodation. No scleral icterus. Extraocular muscles intact.  HEENT: Head atraumatic, normocephalic. Oropharynx and nasopharynx clear.  NECK:  Supple, no jugular venous distention. No thyroid enlargement, no tenderness.  LUNGS: Good A/E b/l, no wheezing, rales, rhonchi. No use of accessory muscles of respiration.  CARDIOVASCULAR: S1, S2 normal. No murmurs, rubs, or gallops.  ABDOMEN: Soft, nontender, nondistended. Bowel sounds present. No organomegaly or mass.  EXTREMITIES: No cyanosis, clubbing or edema b/l.    NEUROLOGIC: Cranial nerves II through XII are intact. No focal Motor or sensory deficits b/l. Globally weak with some tremors noted.   PSYCHIATRIC:  patient is alert and oriented x 1.  SKIN: No obvious rash, lesion, or ulcer.   Right upper extremity AV fistula with good bruit and good thrill  LABORATORY PANEL:  CBC  Recent Labs Lab 07/27/16 0937  WBC 7.5  HGB 11.4*  HCT 35.2*  PLT 196    Chemistries   Recent Labs Lab 07/24/16 1424  07/25/16 0836  07/27/16 0937  NA  --   < > 138  < > 142  K  --   <  > 5.1  < > 4.7  CL  --   < > 96*  < > 98*  CO2  --   < > 23  < > 24  GLUCOSE  --   < > 131*  < > 105*  BUN  --   < > 72*  < > 59*  CREATININE  --   < > 13.04*  < > 11.85*  CALCIUM  --   < > 7.9*  < > 8.2*  MG 1.7  --   --   --   --   AST  --   --  186*  --   --   ALT  --   --  236*  --   --   ALKPHOS  --   --  278*  --   --   BILITOT  --   --  4.0*  --   --   < > = values in this interval not displayed. Cardiac Enzymes  Recent Labs Lab 07/24/16 1424  TROPONINI 0.03*   RADIOLOGY:  Ct Head Wo Contrast  Result Date: 07/26/2016 CLINICAL DATA:  Altered mental status. Pneumonia. Bradycardia and hypotension with decreased responsiveness. EXAM: CT HEAD WITHOUT CONTRAST TECHNIQUE: Contiguous axial images were obtained from the base of the skull through the vertex without intravenous contrast. COMPARISON:  02/11/2015 FINDINGS: Brain: Diffuse cerebral atrophy. Ventricular dilatation consistent with central atrophy. Low-attenuation changes in the deep white matter consistent  with small vessel ischemia. No mass effect or midline shift. No abnormal extra-axial fluid collections. Gray-white matter junctions are distinct. Basal cisterns are not effaced. No acute intracranial hemorrhage. Vascular: Prominent vascular calcifications are present. Skull: Normal. Negative for fracture or focal lesion. Sinuses/Orbits: No acute finding. Other: No significant changes since previous study. IMPRESSION: No acute intracranial abnormalities. Chronic atrophy and small vessel ischemic changes. Electronically Signed   By: Lucienne Capers M.D.   On: 07/26/2016 01:49   Mr Brain Wo Contrast  Result Date: 07/26/2016 CLINICAL DATA:  66 year old male with acute encephalopathy, unresponsive status post admission and starting on treatment for pneumonia. Hypotensive episodes. Initial encounter. EXAM: MRI HEAD WITHOUT CONTRAST TECHNIQUE: Multiplanar, multiecho pulse sequences of the brain and surrounding structures were obtained  without intravenous contrast. COMPARISON:  Head CTs without contrast 0134 hours today and earlier. Limited brain MRI 12/13/2014. FINDINGS: Study is mildly degraded by motion artifact despite repeated imaging attempts. Brain: No restricted diffusion to suggest acute infarction. No midline shift, mass effect, evidence of mass lesion, ventriculomegaly, extra-axial collection or acute intracranial hemorrhage. Cervicomedullary junction and pituitary are within normal limits. 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 Probable small chronic lacunar infarct in the posterior right pons. Patchy nonspecific bilateral cerebral white matter T2 and FLAIR hyperintensity. No cortical encephalomalacia or chronic cerebral blood products are evident. Deep gray matter nuclei appear normal. Vascular: Major intracranial vascular flow voids appear preserved. Skull and upper cervical spine: Upper cervical spine poorly visualized. Skull bone marrow signal within normal limits. Sinuses/Orbits: Negative orbit soft tissues. Trace paranasal sinus mucosal thickening. Other: Visible internal auditory structures appear normal. Mastoids are clear. Negative scalp soft tissues. IMPRESSION: 1.  No acute intracranial abnormality. 2. Mild to moderate chronic small vessel ischemia in the brainstem and cerebellum. Less pronounced chronic cerebral white matter changes probably also are small vessel disease related. Electronically Signed   By: Genevie Ann M.D.   On: 07/26/2016 13:36   ASSESSMENT AND PLAN:    66 y.o. male with a history of chronic diastolic congestive heart failure, end-stage renal disease on hemodialysis status post failed kidney transplant, hypertension, hyperlipidemia, anemia of chronic disease, seizure disorder, gout and type 2 diabetes now being admitted with:  1. Healthcare Associated Pneumonia, this is an incidental finding however patient was tachycardic, tachypnea in the emergency department - MRSA PCR negative  And  off Vancomycin now.    - cont. Levaquin - cultures (-) so far  2. Back pain, musculoskeletal in nature -Thoracic xray OK - PT eval appreciated--HHPT  3. AMS/Encephalopathy - etiology unclear.  Previously thought to be due to transient Hyptension, Bradycardia but that has resolved and pt. Is still altered.  - CT head, MRI Brain (-). Await EEG results.  Appreciate Neuro input.  - ?? Underlying dementia  4. ESRD on HD -Nephrology consult for continued dialysis and cont. Dialysis on Tu, Thus, Sat.   5. Transient bradycardia with hypotension resolved. - History of chronic diastolic heart failure - hold Coreg.   6. History of coronary artery disease status post MI -Continue aspirin, Plavix,  Nitroglycerin  7. History of GERD -Continue Protonix  8. History of seizures -Continue Keppra, Await EEG. Neuro consult apprecaited.   9. elevated LFT's -not in HF, denies any right UQ pain -hold atorvastatin -Ultrasound abdomen unremarkable, LFT's improved.   Case discussed with Care Management/Social Worker. Management plans discussed with the patient, family and they are in agreement.  CODE STATUS: FULL  DVT Prophylaxis: heparin  TOTAL TIME  TAKING CARE OF THIS PATIENT: 25 minutes.    POSSIBLE D/C IN 2-3 DAYS, DEPENDING ON CLINICAL CONDITION.  Note: This dictation was prepared with Dragon dictation along with smaller phrase technology. Any transcriptional errors that result from this process are unintentional.  Henreitta Leber M.D on 07/27/2016 at 2:03 PM  Between 7am to 6pm - Pager - [phone]  After 6pm go to [email or web address] - password [email or web address]  Office  [phone]  CC: Primary care physician; [name], MD

## 2016-07-27 NOTE — Progress Notes (Signed)
Hemodialysis completed. 

## 2016-07-28 LAB — GLUCOSE, CAPILLARY
GLUCOSE-CAPILLARY: 185 mg/dL — AB (ref 65–99)
Glucose-Capillary: 89 mg/dL (ref 65–99)
Glucose-Capillary: 95 mg/dL (ref 65–99)
Glucose-Capillary: 202 mg/dL — ABNORMAL HIGH (ref 65–99)

## 2016-07-28 MED ORDER — LEVOFLOXACIN IN D5W 500 MG/100ML IV SOLN
500.0000 mg | INTRAVENOUS | Status: DC
  Administered 2016-07-28: 500 mg via INTRAVENOUS
  Filled 2016-07-28: qty 100

## 2016-07-28 MED ORDER — LEVOFLOXACIN 500 MG PO TABS: 500.0000 mg | ORAL_TABLET | ORAL | Status: DC

## 2016-07-28 NOTE — Progress Notes (Signed)
[facility]  ROUNDING NOTE   Subjective:   Wife at bedside. More awake and alert. Talking but not answering appropriately. With flat affect.   Hemodialysis yesterday. Tolerated treatment well. UF of 1.5 kg.    Objective:  Vital signs in last 24 hours:  Temp:  [97 F (36.1 C)-98.2 F (36.8 C)] 98.2 F (36.8 C) (02/11 0747) Pulse Rate:  [66-76] 76 (02/11 0747) Resp:  [13-20] 17 (02/11 0747) BP: (85-149)/(63-87) 149/87 (02/11 0747) SpO2:  [96 %-99 %] 97 % (02/11 0747) Weight:  [72.2 kg (159 lb 2.8 oz)] 72.2 kg (159 lb 2.8 oz) (02/10 1244)  Weight change:  Filed Weights   07/26/16 0640 07/27/16 0920 07/27/16 1244  Weight: 74.3 kg (163 lb 14.4 oz) 74.3 kg (163 lb 14.4 oz) 72.2 kg (159 lb 2.8 oz)    Intake/Output: I/O last 3 completed shifts: In: 214 [IV Piggyback:214] Out: 1500 [Other:1500]   Intake/Output this shift:  No intake/output data recorded.  Physical Exam: General: NAD, laying in bed  Head: Normocephalic, atraumatic. Moist oral mucosal membranes  Eyes: Anicteric, PERRL  Neck: Supple, trachea midline  Lungs:  Clear to auscultation  Heart: Regular rate and rhythm  Abdomen:  Soft, nontender,   Extremities: no peripheral edema.  Neurologic: Alert to self only. Following commands. Moving all four extremities  Skin: No lesions  Access: Left forearm AVG    Basic Metabolic Panel:  Recent Labs Lab 07/24/16 1424 07/24/16 2049 07/25/16 0836 07/25/16 2324 07/26/16 0403 07/27/16 0937  NA  --  138 138 140 140 142  K  --  5.5* 5.1 4.3 4.5 4.7  CL  --  95* 96* 98* 97* 98*  CO2  --  26 23 23 22 24   GLUCOSE  --  140* 131* 86 97 105*  BUN  --  64* 72* 29* 34* 59*  CREATININE  --  11.68* 13.04* 7.52* 8.26* 11.85*  CALCIUM  --  7.6* 7.9* 8.3* 8.1* 8.2*  MG 1.7  --   --   --   --   --   PHOS  --   --   --   --   --  4.9*    Liver Function Tests:  Recent Labs Lab 07/24/16 0806 07/25/16 0836 07/27/16 0937  AST 575* 186*  --   ALT 414* 236*  --    ALKPHOS 347* 278*  --   BILITOT 3.7* 4.0*  --   PROT 8.9* 8.0  --   ALBUMIN 4.0 3.5  3.5 3.2*   No results for input(s): LIPASE, AMYLASE in the last 168 hours.  Recent Labs Lab 07/24/16 2049  AMMONIA 24    CBC:  Recent Labs Lab 07/23/16 2156 07/24/16 0806 07/25/16 0836 07/25/16 2334 07/26/16 0403 07/27/16 0937  WBC 14.6* 20.1* 12.3* 7.3 7.8 7.5  NEUTROABS 12.6* 18.1*  --  5.6  --   --   HGB 13.8 13.2 11.8* 11.4* 11.0* 11.4*  HCT 43.6 41.5 36.7* 35.5* 35.4* 35.2*  MCV 74.4* 74.9* 74.1* 74.2* 75.3* 73.8*  PLT 285 250 210 202 200 196    Cardiac Enzymes:  Recent Labs Lab 07/23/16 2156 07/24/16 0244 07/24/16 0806 07/24/16 1424  TROPONINI 0.03* 0.04* 0.06* 0.03*    BNP: Invalid input(s): POCBNP  CBG:  Recent Labs Lab 07/27/16 0745 07/27/16 1640 07/27/16 2110 07/28/16 0746 07/28/16 1137  GLUCAP 104* 9 85 59 95    Microbiology: Results for orders placed or performed during the hospital encounter of 07/23/16  Culture,  blood (routine x 2) Call MD if unable to obtain prior to antibiotics being given     Status: None (Preliminary result)   Collection Time: 07/24/16  2:44 AM  Result Value Ref Range Status   Specimen Description BLOOD RIGHT HAND  Final   Special Requests BOTTLES DRAWN AEROBIC AND ANAEROBIC BCAV  Final   Culture NO GROWTH 4 DAYS  Final   Report Status PENDING  Incomplete  MRSA PCR Screening     Status: None   Collection Time: 07/24/16  4:17 AM  Result Value Ref Range Status   MRSA by PCR NEGATIVE NEGATIVE Final    Comment:        The GeneXpert MRSA Assay (FDA approved for NASAL specimens only), is one component of a comprehensive MRSA colonization surveillance program. It is not intended to diagnose MRSA infection nor to guide or monitor treatment for MRSA infections.   Culture, blood (routine x 2) Call MD if unable to obtain prior to antibiotics being given     Status: None (Preliminary result)   Collection Time: 07/24/16  8:06  AM  Result Value Ref Range Status   Specimen Description BLOOD R AC  Final   Special Requests BOTTLES DRAWN AEROBIC AND ANAEROBIC BCAV  Final   Culture NO GROWTH 4 DAYS  Final   Report Status PENDING  Incomplete  Culture, blood (Routine X 2) w Reflex to ID Panel     Status: None (Preliminary result)   Collection Time: 07/25/16 11:34 PM  Result Value Ref Range Status   Specimen Description BLOOD  R HAND  Final   Special Requests   Final    BOTTLES DRAWN AEROBIC AND ANAEROBIC  AER 6 ML ANA 6 ML   Culture NO GROWTH 3 DAYS  Final   Report Status PENDING  Incomplete  Culture, blood (Routine X 2) w Reflex to ID Panel     Status: None (Preliminary result)   Collection Time: 07/25/16 11:34 PM  Result Value Ref Range Status   Specimen Description BLOOD  R HAND  Final   Special Requests   Final    BOTTLES DRAWN AEROBIC AND ANAEROBIC  AER 5 ML ANA 5 ML    Culture NO GROWTH 3 DAYS  Final   Report Status PENDING  Incomplete    Coagulation Studies: No results for input(s): LABPROT, INR in the last 72 hours.  Urinalysis: No results for input(s): COLORURINE, LABSPEC, PHURINE, GLUCOSEU, HGBUR, BILIRUBINUR, KETONESUR, PROTEINUR, UROBILINOGEN, NITRITE, LEUKOCYTESUR in the last 72 hours.  Invalid input(s): APPERANCEUR    Imaging: Mr Brain Wo Contrast  Result Date: 07/26/2016 CLINICAL DATA:  66 year old male with acute encephalopathy, unresponsive status post admission and starting on treatment for pneumonia. Hypotensive episodes. Initial encounter. EXAM: MRI HEAD WITHOUT CONTRAST TECHNIQUE: Multiplanar, multiecho pulse sequences of the brain and surrounding structures were obtained without intravenous contrast. COMPARISON:  Head CTs without contrast 0134 hours today and earlier. Limited brain MRI 12/13/2014. FINDINGS: Study is mildly degraded by motion artifact despite repeated imaging attempts. Brain: No restricted diffusion to suggest acute infarction. No midline shift, mass effect, evidence of mass  lesion, ventriculomegaly, extra-axial collection or acute intracranial hemorrhage. Cervicomedullary junction and pituitary are within normal limits. 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 Probable small chronic lacunar infarct in the posterior right pons. Patchy nonspecific bilateral cerebral white matter T2 and FLAIR hyperintensity. No cortical encephalomalacia or chronic cerebral blood products are evident. Deep gray matter nuclei appear normal. Vascular: Major intracranial vascular flow voids  appear preserved. Skull and upper cervical spine: Upper cervical spine poorly visualized. Skull bone marrow signal within normal limits. Sinuses/Orbits: Negative orbit soft tissues. Trace paranasal sinus mucosal thickening. Other: Visible internal auditory structures appear normal. Mastoids are clear. Negative scalp soft tissues. IMPRESSION: 1.  No acute intracranial abnormality. 2. Mild to moderate chronic small vessel ischemia in the brainstem and cerebellum. Less pronounced chronic cerebral white matter changes probably also are small vessel disease related. Electronically Signed   By: Genevie Ann M.D.   On: 07/26/2016 13:36     Medications:    . allopurinol  100 mg Oral Daily  . aspirin EC  81 mg Oral Daily  . B-complex with vitamin C  1 tablet Oral Daily  . clopidogrel  75 mg Oral Q breakfast  . dextromethorphan  30 mg Oral BID   And  . guaiFENesin  600 mg Oral BID  . heparin  5,000 Units Subcutaneous Q8H  . insulin aspart  0-9 Units Subcutaneous TID WC  . levETIRAcetam  1,000 mg Intravenous Q12H  . levofloxacin (LEVAQUIN) IV  500 mg Intravenous Q48H  . mirtazapine  7.5 mg Oral QHS  . multivitamin  1 tablet Oral QHS  . pantoprazole  40 mg Oral Daily   ipratropium-albuterol, morphine injection, nitroGLYCERIN, ondansetron (ZOFRAN) IV, oxyCODONE  Assessment/ Plan:  [name] is a 66 y.o. Asian [place]) male with End stage kidney disease status post transplant now on  hemodialysis., hypertension, coronary artery disease, meningitis, hyperlipidemia, gout, diabetes mellitus type II, admitted to [facility] on 07/23/2016 for Hospital-acquired pneumonia [J18.9] Thoracic back pain, unspecified back pain laterality, unspecified chronicity [M54.6]   TTS [place]. Left AVF  1. End Stage Renal Disease: Hemodialysis treatment yesterday. Tolerated treatment well.  - continue TTS schedule.   2. Hypertension: blood pressure now at goal. Hypotensive on admssion.  - holding losartan and carvedilol  3. Anemia of chronic kidney disease: hemoglobin 11.4 - hold epo.   4. Secondary Hyperparathyroidism: outpatient PTH, phos and calcium at goal. Not currently on any binders.   5. Pneumonia: appreciate pulmonary input - levofloxacin - renally dosed.   6. Encephalopathy: MRI negative. CT negative. EEG slow -  Appreciate neuro input.  - Increased Keppra dose    LOS: [place], [place] 2/11/201812:01 PM

## 2016-07-28 NOTE — Progress Notes (Signed)
[facility] [place] NAME: [name]    MR#:  [id number]  DATE OF BIRTH:  1950/10/16  SUBJECTIVE:   Patient is much more awake and following simple commands today. No other acute events overnight. Hemodynamically stable.  REVIEW OF SYSTEMS:   Review of Systems  Constitutional: Negative for chills and fever.  HENT: Negative for congestion and tinnitus.   Eyes: Negative for blurred vision and double vision.  Respiratory: Negative for cough, shortness of breath and wheezing.   Cardiovascular: Negative for chest pain, orthopnea and PND.  Gastrointestinal: Negative for abdominal pain, diarrhea, nausea and vomiting.  Genitourinary: Negative for dysuria and hematuria.  Skin: Negative for itching.  Neurological: Negative for dizziness, sensory change and focal weakness.  All other systems reviewed and are negative.  Tolerating Diet: yes Tolerating PT: Home health PT  DRUG ALLERGIES:   Allergies  Allergen Reactions  . Ivp Dye [Iodinated Diagnostic Agents] Other (See Comments)    Pt denied    VITALS:  Blood pressure (!) 150/77, pulse 73, temperature 97.8 F (36.6 C), temperature source Oral, resp. rate (!) 24, height 5\' 4"  (1.626 m), weight 72.2 kg (159 lb 2.8 oz), SpO2 98 %.  PHYSICAL EXAMINATION:   Physical Exam  GENERAL:  66 y.o.-year-old patient lying in the bed in NAD.  EYES: Pupils equal, round, reactive to light and accommodation. No scleral icterus. Extraocular muscles intact.  HEENT: Head atraumatic, normocephalic. Oropharynx and nasopharynx clear.  NECK:  Supple, no jugular venous distention. No thyroid enlargement, no tenderness.  LUNGS: Good A/E b/l, no wheezing, rales, rhonchi. No use of accessory muscles of respiration.  CARDIOVASCULAR: S1, S2 normal. No murmurs, rubs, or gallops.  ABDOMEN: Soft, nontender, nondistended. Bowel sounds present. No organomegaly or mass.  EXTREMITIES: No cyanosis, clubbing or edema  b/l.    NEUROLOGIC: Cranial nerves II through XII are intact. No focal Motor or sensory deficits b/l. Follows simple commands PSYCHIATRIC:  patient is alert and oriented x 2.  SKIN: No obvious rash, lesion, or ulcer.   Right upper extremity AV fistula with good bruit and good thrill  LABORATORY PANEL:  CBC  Recent Labs Lab 07/27/16 0937  WBC 7.5  HGB 11.4*  HCT 35.2*  PLT 196    Chemistries   Recent Labs Lab 07/24/16 1424  07/25/16 0836  07/27/16 0937  NA  --   < > 138  < > 142  K  --   < > 5.1  < > 4.7  CL  --   < > 96*  < > 98*  CO2  --   < > 23  < > 24  GLUCOSE  --   < > 131*  < > 105*  BUN  --   < > 72*  < > 59*  CREATININE  --   < > 13.04*  < > 11.85*  CALCIUM  --   < > 7.9*  < > 8.2*  MG 1.7  --   --   --   --   AST  --   --  186*  --   --   ALT  --   --  236*  --   --   ALKPHOS  --   --  278*  --   --   BILITOT  --   --  4.0*  --   --   < > = values in this interval not displayed. Cardiac Enzymes  Recent  Labs Lab 07/24/16 1424  TROPONINI 0.03*   RADIOLOGY:  No results found. ASSESSMENT AND PLAN:    66 y.o. male with a history of chronic diastolic congestive heart failure, end-stage renal disease on hemodialysis status post failed kidney transplant, hypertension, hyperlipidemia, anemia of chronic disease, seizure disorder, gout and type 2 diabetes now being admitted with  1. Healthcare Associated Pneumonia, this is an incidental finding however patient was tachycardic, tachypnea in the emergency  - MRSA PCR negative  And off Vancomycin now.    - cont. IV Levaquin.    2. Back pain, musculoskeletal in nature -Thoracic xray OK - PT eval appreciated--HHPT  3. AMS/Encephalopathy - much improved today.  Appreciate neuro input and likely this is secondary to seizures as patient was not getting any antiepileptics orally as he could not take them. Now he has received high-dose of IV Keppra and mental status is much improved. EEG was negative although.  -  CT head, MRI Brain (-).   4. ESRD on HD -Nephrology consult for continued dialysis and cont. Dialysis on Tu, Thus, Sat.   5. Transient bradycardia with hypotension resolved. - History of chronic diastolic heart failure - hold Coreg.   6. History of coronary artery disease status post MI -Continue aspirin, Plavix,  Nitroglycerin  7. History of GERD -Continue Protonix  8. History of seizures -Continue higher dose  Keppra, EEG (-) for acute seizures. Neuro consult apprecaited.   9. elevated LFT's -not in HF, denies any right UQ pain -hold atorvastatin -Ultrasound abdomen unremarkable, LFT's improved.   Case discussed with Care Management/Social Worker. Management plans discussed with the patient, family and they are in agreement.  CODE STATUS: FULL  DVT Prophylaxis: heparin  TOTAL TIME TAKING CARE OF THIS PATIENT: 30 minutes.   POSSIBLE D/C IN 1-2 DAYS, DEPENDING ON CLINICAL CONDITION.  Note: This dictation was prepared with Dragon dictation along with smaller phrase technology. Any transcriptional errors that result from this process are unintentional.  Henreitta Leber M.D on 07/28/2016 at 1:52 PM  Between 7am to 6pm - Pager - [phone]  After 6pm go to [email or web address] - password [facility]  Office  [phone]  CC: Primary care physician; [name], MD

## 2016-07-28 NOTE — Progress Notes (Addendum)
Subjective: Patient improved today.  Remains lethargic but once awakened able to speak and understand [place].    Objective: Current vital signs: BP (!) 150/77 (BP Location: Right Arm)   Pulse 73   Temp 97.8 F (36.6 C) (Oral)   Resp (!) 24   Ht 5\' 4"  (1.626 m)   Wt 72.2 kg (159 lb 2.8 oz)   SpO2 98%   BMI 27.32 kg/m  Vital signs in last 24 hours: Temp:  [97 F (36.1 C)-98.2 F (36.8 C)] 97.8 F (36.6 C) (02/11 1201) Pulse Rate:  [66-76] 73 (02/11 1201) Resp:  [13-24] 24 (02/11 1201) BP: (85-150)/(63-87) 150/77 (02/11 1201) SpO2:  [96 %-99 %] 98 % (02/11 1201) Weight:  [72.2 kg (159 lb 2.8 oz)] 72.2 kg (159 lb 2.8 oz) (02/10 1244)  Intake/Output from previous day: 02/10 0701 - 02/11 0700 In: 214 [IV Piggyback:214] Out: 1500  Intake/Output this shift: No intake/output data recorded. Nutritional status: Diet renal with fluid restriction Fluid restriction: 1200 mL Fluid; Room service appropriate? Yes; Fluid consistency: Thin  Neurologic Exam: Mental Status: Lethargic.  Awakened with light sternal rub.  Patient localizes to pain.  Some confusion still noted but able to count fingers and put up correct number of fingers to command.  Moving all extremities against gravity.  Cranial Nerves: II: Discs flat bilaterally; Visual fields grossly normal, pupils equal, round, reactive to light and accommodation III,IV, VI: ptosis not present, extra-ocular motions intact bilaterally V,VII: mild flattening of right NLF, facial light touch sensation normal bilaterally VIII: hearing normal bilaterally IX,X: gag reflex present XI: bilateral shoulder shrug XII: midline tongue extension Motor: Moves all extremities weakly against gravity Sensory: Responds to light touch throughout   Lab Results: Basic Metabolic Panel:  Recent Labs Lab 07/24/16 1424 07/24/16 2049 07/25/16 0836 07/25/16 2324 07/26/16 0403 07/27/16 0937  NA  --  138 138 140 140 142  K  --  5.5* 5.1 4.3 4.5 4.7   CL  --  95* 96* 98* 97* 98*  CO2  --  26 23 23 22 24   GLUCOSE  --  140* 131* 86 97 105*  BUN  --  64* 72* 29* 34* 59*  CREATININE  --  11.68* 13.04* 7.52* 8.26* 11.85*  CALCIUM  --  7.6* 7.9* 8.3* 8.1* 8.2*  MG 1.7  --   --   --   --   --   PHOS  --   --   --   --   --  4.9*    Liver Function Tests:  Recent Labs Lab 07/24/16 0806 07/25/16 0836 07/27/16 0937  AST 575* 186*  --   ALT 414* 236*  --   ALKPHOS 347* 278*  --   BILITOT 3.7* 4.0*  --   PROT 8.9* 8.0  --   ALBUMIN 4.0 3.5  3.5 3.2*   No results for input(s): LIPASE, AMYLASE in the last 168 hours.  Recent Labs Lab 07/24/16 2049  AMMONIA 24    CBC:  Recent Labs Lab 07/23/16 2156 07/24/16 0806 07/25/16 0836 07/25/16 2334 07/26/16 0403 07/27/16 0937  WBC 14.6* 20.1* 12.3* 7.3 7.8 7.5  NEUTROABS 12.6* 18.1*  --  5.6  --   --   HGB 13.8 13.2 11.8* 11.4* 11.0* 11.4*  HCT 43.6 41.5 36.7* 35.5* 35.4* 35.2*  MCV 74.4* 74.9* 74.1* 74.2* 75.3* 73.8*  PLT 285 250 210 202 200 196    Cardiac Enzymes:  Recent Labs Lab 07/23/16 2156 07/24/16 0244 07/24/16 2979  07/24/16 1424  TROPONINI 0.03* 0.04* 0.06* 0.03*    Lipid Panel: No results for input(s): CHOL, TRIG, HDL, CHOLHDL, VLDL, LDLCALC in the last 168 hours.  CBG:  Recent Labs Lab 07/27/16 0745 07/27/16 1640 07/27/16 2110 07/28/16 0746 07/28/16 1137  GLUCAP 104* 87 85 89 95    Microbiology: Results for orders placed or performed during the hospital encounter of 07/23/16  Culture, blood (routine x 2) Call MD if unable to obtain prior to antibiotics being given     Status: None (Preliminary result)   Collection Time: 07/24/16  2:44 AM  Result Value Ref Range Status   Specimen Description BLOOD RIGHT HAND  Final   Special Requests BOTTLES DRAWN AEROBIC AND ANAEROBIC BCAV  Final   Culture NO GROWTH 4 DAYS  Final   Report Status PENDING  Incomplete  MRSA PCR Screening     Status: None   Collection Time: 07/24/16  4:17 AM  Result Value Ref  Range Status   MRSA by PCR NEGATIVE NEGATIVE Final    Comment:        The GeneXpert MRSA Assay (FDA approved for NASAL specimens only), is one component of a comprehensive MRSA colonization surveillance program. It is not intended to diagnose MRSA infection nor to guide or monitor treatment for MRSA infections.   Culture, blood (routine x 2) Call MD if unable to obtain prior to antibiotics being given     Status: None (Preliminary result)   Collection Time: 07/24/16  8:06 AM  Result Value Ref Range Status   Specimen Description BLOOD R AC  Final   Special Requests BOTTLES DRAWN AEROBIC AND ANAEROBIC BCAV  Final   Culture NO GROWTH 4 DAYS  Final   Report Status PENDING  Incomplete  Culture, blood (Routine X 2) w Reflex to ID Panel     Status: None (Preliminary result)   Collection Time: 07/25/16 11:34 PM  Result Value Ref Range Status   Specimen Description BLOOD  R HAND  Final   Special Requests   Final    BOTTLES DRAWN AEROBIC AND ANAEROBIC  AER 6 ML ANA 6 ML   Culture NO GROWTH 3 DAYS  Final   Report Status PENDING  Incomplete  Culture, blood (Routine X 2) w Reflex to ID Panel     Status: None (Preliminary result)   Collection Time: 07/25/16 11:34 PM  Result Value Ref Range Status   Specimen Description BLOOD  R HAND  Final   Special Requests   Final    BOTTLES DRAWN AEROBIC AND ANAEROBIC  AER 5 ML ANA 5 ML    Culture NO GROWTH 3 DAYS  Final   Report Status PENDING  Incomplete    Coagulation Studies: No results for input(s): LABPROT, INR in the last 72 hours.  Imaging: Mr Brain Wo Contrast  Result Date: 07/26/2016 CLINICAL DATA:  66 year old male with acute encephalopathy, unresponsive status post admission and starting on treatment for pneumonia. Hypotensive episodes. Initial encounter. EXAM: MRI HEAD WITHOUT CONTRAST TECHNIQUE: Multiplanar, multiecho pulse sequences of the brain and surrounding structures were obtained without intravenous contrast. COMPARISON:  Head CTs  without contrast 0134 hours today and earlier. Limited brain MRI 12/13/2014. FINDINGS: Study is mildly degraded by motion artifact despite repeated imaging attempts. Brain: No restricted diffusion to suggest acute infarction. No midline shift, mass effect, evidence of mass lesion, ventriculomegaly, extra-axial collection or acute intracranial hemorrhage. Cervicomedullary junction and pituitary are within normal limits. 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 Probable  small chronic lacunar infarct in the posterior right pons. Patchy nonspecific bilateral cerebral white matter T2 and FLAIR hyperintensity. No cortical encephalomalacia or chronic cerebral blood products are evident. Deep gray matter nuclei appear normal. Vascular: Major intracranial vascular flow voids appear preserved. Skull and upper cervical spine: Upper cervical spine poorly visualized. Skull bone marrow signal within normal limits. Sinuses/Orbits: Negative orbit soft tissues. Trace paranasal sinus mucosal thickening. Other: Visible internal auditory structures appear normal. Mastoids are clear. Negative scalp soft tissues. IMPRESSION: 1.  No acute intracranial abnormality. 2. Mild to moderate chronic small vessel ischemia in the brainstem and cerebellum. Less pronounced chronic cerebral white matter changes probably also are small vessel disease related. Electronically Signed   By: Genevie Ann M.D.   On: 07/26/2016 13:36    Medications:  I have reviewed the patient's current medications. Scheduled: . allopurinol  100 mg Oral Daily  . aspirin EC  81 mg Oral Daily  . B-complex with vitamin C  1 tablet Oral Daily  . clopidogrel  75 mg Oral Q breakfast  . dextromethorphan  30 mg Oral BID   And  . guaiFENesin  600 mg Oral BID  . heparin  5,000 Units Subcutaneous Q8H  . insulin aspart  0-9 Units Subcutaneous TID WC  . levETIRAcetam  1,000 mg Intravenous Q12H  . levofloxacin (LEVAQUIN) IV  500 mg Intravenous Q48H  . mirtazapine   7.5 mg Oral QHS  . multivitamin  1 tablet Oral QHS  . pantoprazole  40 mg Oral Daily    Assessment/Plan: Patient improved.  Now receiving Keppra IV.  MRI of the brain reviewed and shows no acute changes.  EEG with slowing and triphasic waves that at times were rhythmic.  Keppra increased after this result available.    Recommendations: 1.  Continue Keppra at higher dose, changing to po once patient can take reliably po   LOS: 4 days   Alexis Goodell, MD Neurology [phone] 07/28/2016  12:06 PM

## 2016-07-28 NOTE — Progress Notes (Addendum)
PHARMACIST - PHYSICIAN COMMUNICATION DR:   Verdell Carmine  CONCERNING: Antibiotic IV to Oral Route Change Policy  RECOMMENDATION: This patient is receiving Levofloxacin  by the intravenous route.  Based on criteria approved by the Pharmacy and Therapeutics Committee, the antibiotic(s) is/are being converted to the equivalent oral dose form(s).   DESCRIPTION: These criteria include:  Patient being treated for a respiratory tract infection, urinary tract infection, cellulitis or clostridium difficile associated diarrhea if on metronidazole  The patient is not neutropenic and does not exhibit a GI malabsorption state  The patient is eating (either orally or via tube) and/or has been taking other orally administered medications for a least 24 hours  The patient is improving clinically and has a Tmax < 100.5  If you have questions about this conversion, please contact the Pharmacy Department  []   [phone] )  [facility] [x]   [phone] )  [facility] []   [phone] )  [facility] []   [phone] )  [facility] []   [phone] )  [place], PharmD Clinical Pharmacist   2/11 1145 RN called pharmacy and said patient cannot take oral medications. Changed levofloxacin back to IV.  Darrow Bussing, PharmD Pharmacy Resident 07/28/2016 11:50 AM

## 2016-07-29 DIAGNOSIS — G9340 Encephalopathy, unspecified: Secondary | ICD-10-CM

## 2016-07-29 LAB — CULTURE, BLOOD (ROUTINE X 2)
CULTURE: NO GROWTH
CULTURE: NO GROWTH

## 2016-07-29 LAB — GLUCOSE, CAPILLARY
Glucose-Capillary: 101 mg/dL — ABNORMAL HIGH (ref 65–99)
Glucose-Capillary: 185 mg/dL — ABNORMAL HIGH (ref 65–99)

## 2016-07-29 MED ORDER — LEVETIRACETAM 1000 MG PO TABS: 1000.0000 mg | ORAL_TABLET | Freq: Every day | ORAL | 1 refills | Status: DC

## 2016-07-29 MED ORDER — DOXYCYCLINE HYCLATE 100 MG PO CAPS: 100.0000 mg | ORAL_CAPSULE | Freq: Two times a day (BID) | ORAL | 0 refills | Status: AC

## 2016-07-29 MED ORDER — LEVETIRACETAM 250 MG PO TABS: ORAL_TABLET | ORAL | 1 refills | Status: DC

## 2016-07-29 NOTE — Discharge Summary (Signed)
[facility] NAME: [name]    MR#:  [id number]  DATE OF BIRTH:  1951/02/23  DATE OF ADMISSION:  07/23/2016 ADMITTING PHYSICIAN: Harvie Bridge, DO  DATE OF DISCHARGE: 07/29/2016  PRIMARY CARE PHYSICIAN: Singh,Jasmine, MD    ADMISSION DIAGNOSIS:  Hospital-acquired pneumonia [J18.9] Thoracic back pain, unspecified back pain laterality, unspecified chronicity [M54.6]  DISCHARGE DIAGNOSIS:  Active Problems:   HCAP (healthcare-associated pneumonia)   Encephalopathy acute   SECONDARY DIAGNOSIS:   Past Medical History:  Diagnosis Date  . Chronic diastolic congestive heart failure ([place])   . Chronic disease anemia   . ESRD (end stage renal disease) on dialysis ([place])    "[place]; [place]; [name]" (09/29/2014)  . GERD (gastroesophageal reflux disease)   . Gout   . High cholesterol   . History of blood transfusion    "related to anemia"  . History of stomach ulcers   . Hypertension   . Type II diabetes mellitus ([place])     HOSPITAL COURSE:   66 y.o.malewith a history of chronic diastolic congestive heart failure, end-stage renal disease on hemodialysis status post failed kidney transplant, hypertension, hyperlipidemia, anemia of chronic disease, seizure disorder, gout and type 2 diabetesnow being admitted with  1. Healthcare Associated Pneumonia, this was an incidental finding however patient was tachycardic, tachypnea in the emergency  -Initially patient was started on IV vancomycin, Zosyn. Then narrowed down to IV Levaquin and not being discharged on oral doxycycline. He has no acute respiratory symptoms is currently afebrile and hemodynamically stable.  2. Back pain, musculoskeletal in nature- she had an x-ray of his thoracic spine which showed no acute abnormality. -Physical therapy evaluated the patient and recommended home health services but the patient and the patient's wife did not want any services at home.  3.  AMS/Encephalopathy - patient developed some altered mental status and encephalopathy on the hospital, he underwent extensive workup including CT head, MRI of the brain which was negative. A neurology consult was obtained. Given his encephalopathy he could not take his oral seizure medications. He was given a higher dose of IV Keppra and since being on that for a few days his mental status is much improved now and back to baseline. -His EEG although was negative. Patient presently is being discharged on Keppra thousand milligrams daily with an additional 250 mg on the days of dialysis after dialysis.  4. ESRD on HD-nephrology was consulted and patient continued dialysis on his normal schedule Tuesday Thursday Saturday and will be resumed on that.  5. Transient bradycardia with hypotension -patient developed this for a period of less than 24 hours. He was in the intensive care unit but did not require any vasopressors or aggressive IV fluids. He was on carvedilol which has now been discontinued. He is had no further episodes of this has been clinically stable for the past 48-72 hours.  6. History of coronary artery disease status post MI - he will Continue aspirin, Plavix,  Nitroglycerin, no acute symptoms while in the hospital.   7. History of GERD - he will Continue Protonix  8. History of seizures -  Pt's Keppra dose has been adjusted given his AMS was due to seizures presumably.  - he was on 500 mg PO BID but now being discharged on 1000 mg Daily with an additional 250 mg on days of dialysis after HD.  Pt. Was seen by neurology who agreed with this management.   9. elevated LFT's -  this was noted on admission and his LFT's have improved.  - RUQ US showed no acute pathology and his Ammonia level was also normal.  DISCHARGE CONDITIONS:   Stable.   CONSULTS OBTAINED:  Treatment Team:  Lavonia Dana, MD Teodoro Spray, MD Laverle Hobby, MD Alexis Goodell, MD Catarina Hartshorn, MD  DRUG ALLERGIES:   Allergies  Allergen Reactions  . Ivp Dye [Iodinated Diagnostic Agents] Other (See Comments)    Pt denied    DISCHARGE MEDICATIONS:   Allergies as of 07/29/2016      Reactions   Ivp Dye [iodinated Diagnostic Agents] Other (See Comments)   Pt denied      Medication List    STOP taking these medications   carvedilol 3.125 MG tablet Commonly known as:  COREG   losartan 100 MG tablet Commonly known as:  COZAAR     TAKE these medications   acetaminophen 325 MG tablet Commonly known as:  TYLENOL Take 2 tablets (650 mg total) by mouth every 6 (six) hours as needed for mild pain (or Fever >/= 101).   albuterol (2.5 MG/3ML) 0.083% nebulizer solution Commonly known as:  PROVENTIL Take 2.5 mg by nebulization every 6 (six) hours as needed for wheezing or shortness of breath.   allopurinol 100 MG tablet Commonly known as:  ZYLOPRIM Take 100 mg by mouth daily.   aspirin 81 MG EC tablet Take 81 mg by mouth daily.   atorvastatin 40 MG tablet Commonly known as:  LIPITOR Take 1 tablet (40 mg total) by mouth daily at 6 PM.   B-complex with vitamin C tablet Take 1 tablet by mouth daily.   clopidogrel 75 MG tablet Commonly known as:  PLAVIX Take 1 tablet (75 mg total) by mouth daily with breakfast.   doxycycline 100 MG capsule Commonly known as:  VIBRAMYCIN Take 1 capsule (100 mg total) by mouth 2 (two) times daily.   epoetin alfa 4000 UNIT/ML injection Commonly known as:  EPOGEN,PROCRIT Inject 4,000 Units into the vein.   heparin 100-0.45 UNIT/ML-% infusion Inject 1,000 Units/hr into the vein continuous.   levETIRAcetam 1000 MG tablet Commonly known as:  KEPPRA Take 1 tablet (1,000 mg total) by mouth daily. What changed:  medication strength  how much to take  when to take this   levETIRAcetam 250 MG tablet Commonly known as:  KEPPRA Take on the Days on Dialysis (Tues, Thurs, Sat) after Dialysis. What changed:  You were  already taking a medication with the same name, and this prescription was added. Make sure you understand how and when to take each.   Melatonin 3 MG Tabs Take 3 mg by mouth at bedtime.   mirtazapine 7.5 MG tablet Commonly known as:  REMERON Take 7.5 mg by mouth at bedtime.   multivitamin Tabs tablet Take 1 tablet by mouth at bedtime.   nitroGLYCERIN 0.4 MG SL tablet Commonly known as:  NITROSTAT Place 1 tablet (0.4 mg total) under the tongue every 5 (five) minutes x 3 doses as needed for chest pain.   nitroGLYCERIN 2 % ointment Commonly known as:  NITROGLYN Apply 0.5 inches topically every 6 (six) hours.   pantoprazole 40 MG tablet Commonly known as:  PROTONIX Take 40 mg by mouth daily.         DISCHARGE INSTRUCTIONS:   DIET:  Cardiac diet and Renal diet  DISCHARGE CONDITION:  Stable  ACTIVITY:  Activity as tolerated  OXYGEN:  Home Oxygen: No.   Oxygen Delivery: room air  DISCHARGE  LOCATION:  home   If you experience worsening of your admission symptoms, develop shortness of breath, life threatening emergency, suicidal or homicidal thoughts you must seek medical attention immediately by calling 911 or calling your MD immediately  if symptoms less severe.  You Must read complete instructions/literature along with all the possible adverse reactions/side effects for all the Medicines you take and that have been prescribed to you. Take any new Medicines after you have completely understood and accpet all the possible adverse reactions/side effects.   Please note  You were cared for by a hospitalist during your hospital stay. If you have any questions about your discharge medications or the care you received while you were in the hospital after you are discharged, you can call the unit and asked to speak with the hospitalist on call if the hospitalist that took care of you is not available. Once you are discharged, your primary care physician will handle any further  medical issues. Please note that NO REFILLS for any discharge medications will be authorized once you are discharged, as it is imperative that you return to your primary care physician (or establish a relationship with a primary care physician if you do not have one) for your aftercare needs so that they can reassess your need for medications and monitor your lab values.     Today   Mental status improved and close to baseline.  No acute events overnight.  Wife at bedside and will d/c home today.   VITAL SIGNS:  Blood pressure (!) 155/71, pulse 65, temperature 97.9 F (36.6 C), temperature source Oral, resp. rate 18, height 5\' 4"  (1.626 m), weight 72.2 kg (159 lb 2.8 oz), SpO2 97 %.  I/O:   Intake/Output Summary (Last 24 hours) at 07/29/16 1536 Last data filed at 07/29/16 1033  Gross per 24 hour  Intake              450 ml  Output                0 ml  Net              450 ml    PHYSICAL EXAMINATION:   GENERAL:  66 y.o.-year-old patient lying in the bed in NAD.  EYES: Pupils equal, round, reactive to light and accommodation. No scleral icterus. Extraocular muscles intact.  HEENT: Head atraumatic, normocephalic. Oropharynx and nasopharynx clear.  NECK:  Supple, no jugular venous distention. No thyroid enlargement, no tenderness.  LUNGS: Good A/E b/l, no wheezing, rales, rhonchi. No use of accessory muscles of respiration.  CARDIOVASCULAR: S1, S2 normal. No murmurs, rubs, or gallops.  ABDOMEN: Soft, nontender, nondistended. Bowel sounds present. No organomegaly or mass.  EXTREMITIES: No cyanosis, clubbing or edema b/l.    NEUROLOGIC: Cranial nerves II through XII are intact. No focal Motor or sensory deficits b/l. Follows simple commands PSYCHIATRIC:  patient is alert and oriented x 2.  SKIN: No obvious rash, lesion, or ulcer.   Right upper extremity AV fistula with good bruit and good thrill  DATA REVIEW:   CBC  Recent Labs Lab 07/27/16 0937  WBC 7.5  HGB 11.4*  HCT  35.2*  PLT 196    Chemistries   Recent Labs Lab 07/24/16 1424  07/25/16 0836  07/27/16 0937  NA  --   < > 138  < > 142  K  --   < > 5.1  < > 4.7  CL  --   < > 96*  < >  98*  CO2  --   < > 23  < > 24  GLUCOSE  --   < > 131*  < > 105*  BUN  --   < > 72*  < > 59*  CREATININE  --   < > 13.04*  < > 11.85*  CALCIUM  --   < > 7.9*  < > 8.2*  MG 1.7  --   --   --   --   AST  --   --  186*  --   --   ALT  --   --  236*  --   --   ALKPHOS  --   --  278*  --   --   BILITOT  --   --  4.0*  --   --   < > = values in this interval not displayed.  Cardiac Enzymes  Recent Labs Lab 07/24/16 1424  TROPONINI 0.03*    Microbiology Results  Results for orders placed or performed during the hospital encounter of 07/23/16  Culture, blood (routine x 2) Call MD if unable to obtain prior to antibiotics being given     Status: None   Collection Time: 07/24/16  2:44 AM  Result Value Ref Range Status   Specimen Description BLOOD RIGHT HAND  Final   Special Requests BOTTLES DRAWN AEROBIC AND ANAEROBIC BCAV  Final   Culture NO GROWTH 5 DAYS  Final   Report Status 07/29/2016 FINAL  Final  MRSA PCR Screening     Status: None   Collection Time: 07/24/16  4:17 AM  Result Value Ref Range Status   MRSA by PCR NEGATIVE NEGATIVE Final    Comment:        The GeneXpert MRSA Assay (FDA approved for NASAL specimens only), is one component of a comprehensive MRSA colonization surveillance program. It is not intended to diagnose MRSA infection nor to guide or monitor treatment for MRSA infections.   Culture, blood (routine x 2) Call MD if unable to obtain prior to antibiotics being given     Status: None   Collection Time: 07/24/16  8:06 AM  Result Value Ref Range Status   Specimen Description BLOOD R AC  Final   Special Requests BOTTLES DRAWN AEROBIC AND ANAEROBIC BCAV  Final   Culture NO GROWTH 5 DAYS  Final   Report Status 07/29/2016 FINAL  Final  Culture, blood (Routine X 2) w Reflex to ID  Panel     Status: None (Preliminary result)   Collection Time: 07/25/16 11:34 PM  Result Value Ref Range Status   Specimen Description BLOOD  R HAND  Final   Special Requests   Final    BOTTLES DRAWN AEROBIC AND ANAEROBIC  AER 6 ML ANA 6 ML   Culture NO GROWTH 4 DAYS  Final   Report Status PENDING  Incomplete  Culture, blood (Routine X 2) w Reflex to ID Panel     Status: None (Preliminary result)   Collection Time: 07/25/16 11:34 PM  Result Value Ref Range Status   Specimen Description BLOOD  R HAND  Final   Special Requests   Final    BOTTLES DRAWN AEROBIC AND ANAEROBIC  AER 5 ML ANA 5 ML    Culture NO GROWTH 4 DAYS  Final   Report Status PENDING  Incomplete    RADIOLOGY:  No results found.    Management plans discussed with the patient, family and they are in agreement.  CODE STATUS:  Code Status Orders        Start     Ordered   07/24/16 0219  Full code  Continuous     07/24/16 0219    Code Status History    Date Active Date Inactive Code Status Order ID Comments User Context   02/26/2016  6:03 AM 02/27/2016  5:05 PM Full Code [id number]  Harrie Foreman, MD Inpatient   02/26/2016  5:40 AM 02/26/2016  6:03 AM DNR [id number]  Harrie Foreman, MD Inpatient   02/14/2015  8:16 AM 02/17/2015  1:47 PM DNR [id number]  Epifanio Lesches, MD ED   12/12/2014  8:44 PM 12/16/2014  7:26 PM DNR [id number]  Loletha Grayer, MD ED   11/05/2014 10:09 PM 11/09/2014  9:48 PM Full Code [id number]  Henreitta Leber, MD Inpatient   09/30/2014  1:48 PM 10/02/2014  7:02 PM Full Code [id number]  [name], MD Inpatient   09/29/2014  8:36 PM 09/30/2014  1:48 PM Full Code [id number]  [name], MD Inpatient      TOTAL TIME TAKING CARE OF THIS PATIENT: 40 minutes.    Henreitta Leber M.D on 07/29/2016 at 3:36 PM  Between 7am to 6pm - Pager - [phone]  After 6pm go to [email or web address] - Proofreader  [facility]  Office  [phone]  CC: Primary care  physician; [name], MD

## 2016-07-29 NOTE — Progress Notes (Signed)
Subjective: Continues to improve. States he is in [place] and able to tell me his name.    Objective: Current vital signs: BP (!) 155/71 (BP Location: Right Arm)   Pulse 65   Temp 97.9 F (36.6 C) (Oral)   Resp 18   Ht 5\' 4"  (1.626 m)   Wt 72.2 kg (159 lb 2.8 oz)   SpO2 97%   BMI 27.32 kg/m  Vital signs in last 24 hours: Temp:  [97.6 F (36.4 C)-97.9 F (36.6 C)] 97.9 F (36.6 C) (02/12 0802) Pulse Rate:  [63-73] 65 (02/12 0802) Resp:  [17-24] 18 (02/12 0802) BP: (128-155)/(65-77) 155/71 (02/12 0802) SpO2:  [94 %-98 %] 97 % (02/12 0802)  Intake/Output from previous day: 02/11 0701 - 02/12 0700 In: 100 [IV Piggyback:100] Out: -  Intake/Output this shift: Total I/O In: 350 [P.O.:240; IV Piggyback:110] Out: 0  Nutritional status: Diet renal with fluid restriction Fluid restriction: 1200 mL Fluid; Room service appropriate? Yes; Fluid consistency: Thin  Neurologic Exam: Mental Status: Lethargic.  Awakened with light sternal rub.  Patient localizes to pain.  Some confusion still noted but able to count fingers and put up correct number of fingers to command.  Moving all extremities against gravity.  Cranial Nerves: II: Discs flat bilaterally; Visual fields grossly normal, pupils equal, round, reactive to light and accommodation III,IV, VI: ptosis not present, extra-ocular motions intact bilaterally V,VII: mild flattening of right NLF, facial light touch sensation normal bilaterally VIII: hearing normal bilaterally IX,X: gag reflex present XI: bilateral shoulder shrug XII: midline tongue extension Motor: Moves all extremities weakly against gravity Sensory: Responds to light touch throughout   Lab Results: Basic Metabolic Panel:  Recent Labs Lab 07/24/16 1424 07/24/16 2049 07/25/16 0836 07/25/16 2324 07/26/16 0403 07/27/16 0937  NA  --  138 138 140 140 142  K  --  5.5* 5.1 4.3 4.5 4.7  CL  --  95* 96* 98* 97* 98*  CO2  --  26 23 23 22 24   GLUCOSE  --   140* 131* 86 97 105*  BUN  --  64* 72* 29* 34* 59*  CREATININE  --  11.68* 13.04* 7.52* 8.26* 11.85*  CALCIUM  --  7.6* 7.9* 8.3* 8.1* 8.2*  MG 1.7  --   --   --   --   --   PHOS  --   --   --   --   --  4.9*    Liver Function Tests:  Recent Labs Lab 07/24/16 0806 07/25/16 0836 07/27/16 0937  AST 575* 186*  --   ALT 414* 236*  --   ALKPHOS 347* 278*  --   BILITOT 3.7* 4.0*  --   PROT 8.9* 8.0  --   ALBUMIN 4.0 3.5  3.5 3.2*   No results for input(s): LIPASE, AMYLASE in the last 168 hours.  Recent Labs Lab 07/24/16 2049  AMMONIA 24    CBC:  Recent Labs Lab 07/23/16 2156 07/24/16 0806 07/25/16 0836 07/25/16 2334 07/26/16 0403 07/27/16 0937  WBC 14.6* 20.1* 12.3* 7.3 7.8 7.5  NEUTROABS 12.6* 18.1*  --  5.6  --   --   HGB 13.8 13.2 11.8* 11.4* 11.0* 11.4*  HCT 43.6 41.5 36.7* 35.5* 35.4* 35.2*  MCV 74.4* 74.9* 74.1* 74.2* 75.3* 73.8*  PLT 285 250 210 202 200 196    Cardiac Enzymes:  Recent Labs Lab 07/23/16 2156 07/24/16 0244 07/24/16 0806 07/24/16 1424  TROPONINI 0.03* 0.04* 0.06* 0.03*  Lipid Panel: No results for input(s): CHOL, TRIG, HDL, CHOLHDL, VLDL, LDLCALC in the last 168 hours.  CBG:  Recent Labs Lab 07/28/16 0746 07/28/16 1137 07/28/16 1628 07/28/16 2058 07/29/16 0731  GLUCAP 89 95 185* 202* 101*    Microbiology: Results for orders placed or performed during the hospital encounter of 07/23/16  Culture, blood (routine x 2) Call MD if unable to obtain prior to antibiotics being given     Status: None   Collection Time: 07/24/16  2:44 AM  Result Value Ref Range Status   Specimen Description BLOOD RIGHT HAND  Final   Special Requests BOTTLES DRAWN AEROBIC AND ANAEROBIC BCAV  Final   Culture NO GROWTH 5 DAYS  Final   Report Status 07/29/2016 FINAL  Final  MRSA PCR Screening     Status: None   Collection Time: 07/24/16  4:17 AM  Result Value Ref Range Status   MRSA by PCR NEGATIVE NEGATIVE Final    Comment:        The  GeneXpert MRSA Assay (FDA approved for NASAL specimens only), is one component of a comprehensive MRSA colonization surveillance program. It is not intended to diagnose MRSA infection nor to guide or monitor treatment for MRSA infections.   Culture, blood (routine x 2) Call MD if unable to obtain prior to antibiotics being given     Status: None   Collection Time: 07/24/16  8:06 AM  Result Value Ref Range Status   Specimen Description BLOOD R AC  Final   Special Requests BOTTLES DRAWN AEROBIC AND ANAEROBIC BCAV  Final   Culture NO GROWTH 5 DAYS  Final   Report Status 07/29/2016 FINAL  Final  Culture, blood (Routine X 2) w Reflex to ID Panel     Status: None (Preliminary result)   Collection Time: 07/25/16 11:34 PM  Result Value Ref Range Status   Specimen Description BLOOD  R HAND  Final   Special Requests   Final    BOTTLES DRAWN AEROBIC AND ANAEROBIC  AER 6 ML ANA 6 ML   Culture NO GROWTH 4 DAYS  Final   Report Status PENDING  Incomplete  Culture, blood (Routine X 2) w Reflex to ID Panel     Status: None (Preliminary result)   Collection Time: 07/25/16 11:34 PM  Result Value Ref Range Status   Specimen Description BLOOD  R HAND  Final   Special Requests   Final    BOTTLES DRAWN AEROBIC AND ANAEROBIC  AER 5 ML ANA 5 ML    Culture NO GROWTH 4 DAYS  Final   Report Status PENDING  Incomplete    Coagulation Studies: No results for input(s): LABPROT, INR in the last 72 hours.  Imaging: No results found.  Medications:  I have reviewed the patient's current medications. Scheduled: . allopurinol  100 mg Oral Daily  . aspirin EC  81 mg Oral Daily  . B-complex with vitamin C  1 tablet Oral Daily  . clopidogrel  75 mg Oral Q breakfast  . dextromethorphan  30 mg Oral BID   And  . guaiFENesin  600 mg Oral BID  . heparin  5,000 Units Subcutaneous Q8H  . insulin aspart  0-9 Units Subcutaneous TID WC  . levETIRAcetam  1,000 mg Intravenous Q12H  . levofloxacin (LEVAQUIN) IV  500  mg Intravenous Q48H  . mirtazapine  7.5 mg Oral QHS  . multivitamin  1 tablet Oral QHS  . pantoprazole  40 mg Oral Daily  Assessment/Plan: Patient improved.  Now receiving Keppra IV.  MRI of the brain reviewed and shows no acute changes.  EEG with slowing and triphasic waves that at times were rhythmic.  Keppra increased after this result available.      Recommendations: Continue Keppra 1gm daily along with plavix Do not think any further seizures Would consider changing levaquin to another antibiotic as it lowers seizure threshold Darryl Blumenstein    LOS: 5 days

## 2016-07-29 NOTE — Progress Notes (Signed)
Pt d/c to home today. IV removed intact.  Rx's given to pt w/all questions and concerns addressed.  D/C paperwork reviewed and education provided on Keppra dose change and all medication questions and concerns addressed.  Pt daughter at bedside for home transport.

## 2016-07-29 NOTE — Evaluation (Signed)
Physical Therapy Evaluation Patient Details Name: [name] MRN: [id number] DOB: 12/10/1950 Today's Date: 07/29/2016   History of Present Illness  Pt admitted for HCAP. Pt with history of ESRD on HD, CHF, DM, NSTEMI, and seizures. Pt also complained of thoracic back pain with movement. Was admitted to CCU due to hypotension and bradycardia w/ acute encephallopathy on 07/25/16 but has improved and is back on the floor    Clinical Impression  Pt sleeping upon entrance but was alert and oriented when woken up. He was able to follow and understand basic commands and stated he was feeling okay. Pt able to move to sitting under PT supervision and required increased time to scoot to the EOB. While sitting he began leaning posteriorly and required mod assist to correct. Pt transferred w/ min guarding to standing using a RW. He ambulated w/ a forward flexed posture using a Rw, he took small shuffling steps when turning and required frequent cuing to safely stand inside the walker during ambulation. Pt ambulated 26' and was limited in activity due to nausea. He was safely able to ambulate to his room and required min assist transferring into his bed. Pt vomitted once laying down with the HOB elevated. Nursing staff was notified. Pt demonstrates poor activity tolerance secondary to nausea, he also displays strength and balance deficits that limit functional mobility. Recommend he continually use RW and pt agreed; pt will benefit from skilled PT to improve deficits. Recommend HHPT upon dishcarge w/ 24 hour supervision.     Follow Up Recommendations Home health PT;Supervision/Assistance - 24 hour;Other (comment) (assist w/ OOB activities)    Equipment Recommendations  Rolling walker with 5" wheels    Recommendations for Other Services       Precautions / Restrictions Precautions Precautions: Fall Restrictions Weight Bearing Restrictions: No      Mobility  Bed Mobility Overal bed mobility: Needs  Assistance Bed Mobility: Supine to Sit;Sit to Supine     Supine to sit: Supervision Sit to supine: Min assist   General bed mobility comments: supervision and cuing for moving to sitting at EOB, min assist to get back to supine and cuing for hand placements  Transfers Overall transfer level: Needs assistance Equipment used: Rolling walker (2 wheeled) Transfers: Sit to/from Stand Sit to Stand: Min guard         General transfer comment: cuing for proper hand placement, requires use of UEs,    Ambulation/Gait Ambulation/Gait assistance: Min assist Ambulation Distance (Feet): 100 Feet Assistive device: Rolling walker (2 wheeled) Gait Pattern/deviations: Trunk flexed;Decreased stride length     General Gait Details: pt ambulated w/ forward flexed trunk, short shuffling gait pattern when turning, cuing for safe use of RW, increased gait speed near end due to nausea and wanting to return to his room   Stairs            Wheelchair Mobility    Modified Rankin (Stroke Patients Only)       Balance Overall balance assessment: Needs assistance Sitting-balance support: Feet supported;Bilateral upper extremity supported Sitting balance-Leahy Scale: Fair Sitting balance - Comments: required cuing and began leaning posteriorly, mod assist to bring trunk back to upright position Postural control: Posterior lean Standing balance support: Bilateral upper extremity supported Standing balance-Leahy Scale: Fair Standing balance comment: forward flexed posture, Rw for additional stability                             Pertinent Vitals/Pain  Pain Assessment: No/denies pain    Home Living Family/patient expects to be discharged to:: Private residence Living Arrangements: Spouse/significant other Available Help at Discharge: Family;Available 24 hours/day Type of Home: House Home Access: Stairs to enter Entrance Stairs-Rails: Can reach both Entrance Stairs-Number of  Steps: 3 Home Layout: One level Home Equipment: Walker - 2 wheels      Prior Function Level of Independence: Independent         Comments: pt reports he was ambulatory without AD, however has used RW in past     Hand Dominance        Extremity/Trunk Assessment   Upper Extremity Assessment Upper Extremity Assessment: Overall WFL for tasks assessed    Lower Extremity Assessment Lower Extremity Assessment: Generalized weakness (grossly at least 4/5)    Cervical / Trunk Assessment Cervical / Trunk Assessment: Normal  Communication   Communication: No difficulties  Cognition Arousal/Alertness: Awake/alert Behavior During Therapy: WFL for tasks assessed/performed Overall Cognitive Status: Within Functional Limits for tasks assessed                      General Comments      Exercises     Assessment/Plan    PT Assessment Patient needs continued PT services  PT Problem List Decreased strength;Decreased activity tolerance;Decreased balance;Decreased mobility;Decreased safety awareness;Decreased knowledge of use of DME          PT Treatment Interventions DME instruction;Gait training;Stair training;Functional mobility training;Therapeutic activities;Therapeutic exercise;Balance training;Patient/family education    PT Goals (Current goals can be found in the Care Plan section)  Acute Rehab PT Goals Patient Stated Goal: to return home PT Goal Formulation: With patient Time For Goal Achievement: 08/12/16 Potential to Achieve Goals: Good    Frequency Min 2X/week   Barriers to discharge        Co-evaluation               End of Session Equipment Utilized During Treatment: Gait belt Activity Tolerance: Treatment limited secondary to medical complications (Comment) (nausea ) Patient left: in bed;with bed alarm set;with call bell/phone within reach Nurse Communication: Mobility status         Time: 1351-1415 PT Time Calculation (min) (ACUTE  ONLY): 24 min   Charges:         PT G Codes:        [employer] Student PT 07/29/2016, 2:38 PM

## 2016-07-29 NOTE — Care Management (Signed)
Daughter arrived for transported.  Confirmed they will decline home health at this time.  I have informed her they if after they get home and feel the need, orders can be written by patients PCP singh. RNCM signing off

## 2016-07-29 NOTE — Progress Notes (Signed)
[facility]  ROUNDING NOTE   Subjective:   No acute events today Patient resting quietly in bed No c/o SOB   Objective:  Vital signs in last 24 hours:  Temp:  [97.6 F (36.4 C)-97.9 F (36.6 C)] 97.9 F (36.6 C) (02/12 0802) Pulse Rate:  [63-70] 65 (02/12 0802) Resp:  [17-19] 18 (02/12 0802) BP: (128-155)/(65-75) 155/71 (02/12 0802) SpO2:  [94 %-97 %] 97 % (02/12 0802)  Weight change:  Filed Weights   07/26/16 0640 07/27/16 0920 07/27/16 1244  Weight: 74.3 kg (163 lb 14.4 oz) 74.3 kg (163 lb 14.4 oz) 72.2 kg (159 lb 2.8 oz)    Intake/Output: I/O last 3 completed shifts: In: 204 [IV Piggyback:204] Out: 0    Intake/Output this shift:  Total I/O In: 350 [P.O.:240; IV Piggyback:110] Out: 0   Physical Exam: General: NAD, laying in bed  Head: Normocephalic, atraumatic. Moist oral mucosal membranes  Eyes: Anicteric,   Neck: Supple,   Lungs:  Clear to auscultation  Heart: No rub or gallop  Abdomen:  Soft, nontender,   Extremities: no peripheral edema.  Neurologic: Alert to self. Following some  commands.  Skin: No lesions  Access: Left forearm AVG    Basic Metabolic Panel:  Recent Labs Lab 07/24/16 1424 07/24/16 2049 07/25/16 0836 07/25/16 2324 07/26/16 0403 07/27/16 0937  NA  --  138 138 140 140 142  K  --  5.5* 5.1 4.3 4.5 4.7  CL  --  95* 96* 98* 97* 98*  CO2  --  26 23 23 22 24   GLUCOSE  --  140* 131* 86 97 105*  BUN  --  64* 72* 29* 34* 59*  CREATININE  --  11.68* 13.04* 7.52* 8.26* 11.85*  CALCIUM  --  7.6* 7.9* 8.3* 8.1* 8.2*  MG 1.7  --   --   --   --   --   PHOS  --   --   --   --   --  4.9*    Liver Function Tests:  Recent Labs Lab 07/24/16 0806 07/25/16 0836 07/27/16 0937  AST 575* 186*  --   ALT 414* 236*  --   ALKPHOS 347* 278*  --   BILITOT 3.7* 4.0*  --   PROT 8.9* 8.0  --   ALBUMIN 4.0 3.5  3.5 3.2*   No results for input(s): LIPASE, AMYLASE in the last 168 hours.  Recent Labs Lab 07/24/16 2049  AMMONIA  24    CBC:  Recent Labs Lab 07/23/16 2156 07/24/16 0806 07/25/16 0836 07/25/16 2334 07/26/16 0403 07/27/16 0937  WBC 14.6* 20.1* 12.3* 7.3 7.8 7.5  NEUTROABS 12.6* 18.1*  --  5.6  --   --   HGB 13.8 13.2 11.8* 11.4* 11.0* 11.4*  HCT 43.6 41.5 36.7* 35.5* 35.4* 35.2*  MCV 74.4* 74.9* 74.1* 74.2* 75.3* 73.8*  PLT 285 250 210 202 200 196    Cardiac Enzymes:  Recent Labs Lab 07/23/16 2156 07/24/16 0244 07/24/16 0806 07/24/16 1424  TROPONINI 0.03* 0.04* 0.06* 0.03*    BNP: Invalid input(s): POCBNP  CBG:  Recent Labs Lab 07/28/16 1137 07/28/16 1628 07/28/16 2058 07/29/16 0731 07/29/16 1111  GLUCAP 95 185* 202* 101* 185*    Microbiology: Results for orders placed or performed during the hospital encounter of 07/23/16  Culture, blood (routine x 2) Call MD if unable to obtain prior to antibiotics being given     Status: None   Collection Time: 07/24/16  2:44 AM  Result Value Ref Range Status   Specimen Description BLOOD RIGHT HAND  Final   Special Requests BOTTLES DRAWN AEROBIC AND ANAEROBIC BCAV  Final   Culture NO GROWTH 5 DAYS  Final   Report Status 07/29/2016 FINAL  Final  MRSA PCR Screening     Status: None   Collection Time: 07/24/16  4:17 AM  Result Value Ref Range Status   MRSA by PCR NEGATIVE NEGATIVE Final    Comment:        The GeneXpert MRSA Assay (FDA approved for NASAL specimens only), is one component of a comprehensive MRSA colonization surveillance program. It is not intended to diagnose MRSA infection nor to guide or monitor treatment for MRSA infections.   Culture, blood (routine x 2) Call MD if unable to obtain prior to antibiotics being given     Status: None   Collection Time: 07/24/16  8:06 AM  Result Value Ref Range Status   Specimen Description BLOOD R AC  Final   Special Requests BOTTLES DRAWN AEROBIC AND ANAEROBIC BCAV  Final   Culture NO GROWTH 5 DAYS  Final   Report Status 07/29/2016 FINAL  Final  Culture, blood  (Routine X 2) w Reflex to ID Panel     Status: None (Preliminary result)   Collection Time: 07/25/16 11:34 PM  Result Value Ref Range Status   Specimen Description BLOOD  R HAND  Final   Special Requests   Final    BOTTLES DRAWN AEROBIC AND ANAEROBIC  AER 6 ML ANA 6 ML   Culture NO GROWTH 4 DAYS  Final   Report Status PENDING  Incomplete  Culture, blood (Routine X 2) w Reflex to ID Panel     Status: None (Preliminary result)   Collection Time: 07/25/16 11:34 PM  Result Value Ref Range Status   Specimen Description BLOOD  R HAND  Final   Special Requests   Final    BOTTLES DRAWN AEROBIC AND ANAEROBIC  AER 5 ML ANA 5 ML    Culture NO GROWTH 4 DAYS  Final   Report Status PENDING  Incomplete    Coagulation Studies: No results for input(s): LABPROT, INR in the last 72 hours.  Urinalysis: No results for input(s): COLORURINE, LABSPEC, PHURINE, GLUCOSEU, HGBUR, BILIRUBINUR, KETONESUR, PROTEINUR, UROBILINOGEN, NITRITE, LEUKOCYTESUR in the last 72 hours.  Invalid input(s): APPERANCEUR    Imaging: No results found.   Medications:    . allopurinol  100 mg Oral Daily  . aspirin EC  81 mg Oral Daily  . B-complex with vitamin C  1 tablet Oral Daily  . clopidogrel  75 mg Oral Q breakfast  . dextromethorphan  30 mg Oral BID   And  . guaiFENesin  600 mg Oral BID  . heparin  5,000 Units Subcutaneous Q8H  . insulin aspart  0-9 Units Subcutaneous TID WC  . levETIRAcetam  1,000 mg Intravenous Q12H  . levofloxacin (LEVAQUIN) IV  500 mg Intravenous Q48H  . mirtazapine  7.5 mg Oral QHS  . multivitamin  1 tablet Oral QHS  . pantoprazole  40 mg Oral Daily   ipratropium-albuterol, morphine injection, nitroGLYCERIN, ondansetron (ZOFRAN) IV, oxyCODONE  Assessment/ Plan:  Mr. Sami Froh is a 66 y.o. Asian [place]) male with End stage kidney disease status post transplant now on hemodialysis., hypertension, coronary artery disease, meningitis, hyperlipidemia, gout, diabetes mellitus type II,  admitted to [facility] on 07/23/2016 for Hospital-acquired pneumonia [J18.9] Thoracic back pain, unspecified back pain laterality, unspecified chronicity [M54.6]   TTS  [place] Left AVF  1. End Stage Renal Disease:   - continue TTS schedule.   2. Hypertension: blood pressure now at goal. Hypotensive on admssion.  - holding losartan and carvedilol - consider re-starting at discharge  3. Anemia of chronic kidney disease: hemoglobin 11.4 - hold epo for now  4. Secondary Hyperparathyroidism:   Not currently on any binders.   5. Pneumonia: appreciate pulmonary input - levofloxacin - renally dosed.   6. Encephalopathy: MRI negative. CT negative. EEG slow -  Appreciate neuro input.  - Consider dose adjusting Keppra to 1000 mg daily - Additional 250-500 mg after each dialysis treatment    LOS: 5 [name] 2/12/20182:45 PM

## 2016-07-29 NOTE — Care Management (Signed)
Patient admitted with PNA.  Patient to discharge home today.  Patient lives at home with spouse.  Patient has been weaned to RA.  Updated HD clinical information faxed to [facility] liaison.  PT has assessed patient and recommended home health PT.  Per MD and bedside nurse, patient and wife have declined home health services at discharge.  Patient has RW at home for ambulation.  Patient's daughter to transport at discharge.  RNCM signing off

## 2016-07-30 LAB — CULTURE, BLOOD (ROUTINE X 2)
CULTURE: NO GROWTH
Culture: NO GROWTH

## 2016-09-04 ENCOUNTER — Emergency Department: Payer: Medicare Other

## 2016-09-04 ENCOUNTER — Encounter: Payer: Self-pay | Admitting: Emergency Medicine

## 2016-09-04 ENCOUNTER — Inpatient Hospital Stay
Admission: EM | Admit: 2016-09-04 | Discharge: 2016-09-14 | DRG: 871 | Disposition: A | Discharge status: Home / Self Care | Payer: Medicare Other | Attending: Internal Medicine | Admitting: Internal Medicine

## 2016-09-04 ENCOUNTER — Other Ambulatory Visit: Payer: Self-pay

## 2016-09-04 DIAGNOSIS — E872 Acidosis, unspecified: Secondary | ICD-10-CM

## 2016-09-04 DIAGNOSIS — Z951 Presence of aortocoronary bypass graft: Secondary | ICD-10-CM

## 2016-09-04 DIAGNOSIS — Z940 Kidney transplant status: Secondary | ICD-10-CM

## 2016-09-04 DIAGNOSIS — E785 Hyperlipidemia, unspecified: Secondary | ICD-10-CM | POA: Diagnosis present

## 2016-09-04 DIAGNOSIS — Y95 Nosocomial condition: Secondary | ICD-10-CM | POA: Diagnosis present

## 2016-09-04 DIAGNOSIS — I2510 Atherosclerotic heart disease of native coronary artery without angina pectoris: Secondary | ICD-10-CM | POA: Diagnosis present

## 2016-09-04 DIAGNOSIS — I5042 Chronic combined systolic (congestive) and diastolic (congestive) heart failure: Secondary | ICD-10-CM | POA: Diagnosis present

## 2016-09-04 DIAGNOSIS — I132 Hypertensive heart and chronic kidney disease with heart failure and with stage 5 chronic kidney disease, or end stage renal disease: Secondary | ICD-10-CM | POA: Diagnosis present

## 2016-09-04 DIAGNOSIS — Z8711 Personal history of peptic ulcer disease: Secondary | ICD-10-CM

## 2016-09-04 DIAGNOSIS — D631 Anemia in chronic kidney disease: Secondary | ICD-10-CM | POA: Diagnosis present

## 2016-09-04 DIAGNOSIS — Z4659 Encounter for fitting and adjustment of other gastrointestinal appliance and device: Secondary | ICD-10-CM | POA: Diagnosis not present

## 2016-09-04 DIAGNOSIS — Z7902 Long term (current) use of antithrombotics/antiplatelets: Secondary | ICD-10-CM

## 2016-09-04 DIAGNOSIS — Z992 Dependence on renal dialysis: Secondary | ICD-10-CM | POA: Diagnosis not present

## 2016-09-04 DIAGNOSIS — I252 Old myocardial infarction: Secondary | ICD-10-CM

## 2016-09-04 DIAGNOSIS — Z8249 Family history of ischemic heart disease and other diseases of the circulatory system: Secondary | ICD-10-CM

## 2016-09-04 DIAGNOSIS — Z823 Family history of stroke: Secondary | ICD-10-CM

## 2016-09-04 DIAGNOSIS — Z87891 Personal history of nicotine dependence: Secondary | ICD-10-CM

## 2016-09-04 DIAGNOSIS — K8051 Calculus of bile duct without cholangitis or cholecystitis with obstruction: Secondary | ICD-10-CM

## 2016-09-04 DIAGNOSIS — E7800 Pure hypercholesterolemia, unspecified: Secondary | ICD-10-CM | POA: Diagnosis present

## 2016-09-04 DIAGNOSIS — Z7982 Long term (current) use of aspirin: Secondary | ICD-10-CM

## 2016-09-04 DIAGNOSIS — R748 Abnormal levels of other serum enzymes: Secondary | ICD-10-CM

## 2016-09-04 DIAGNOSIS — A4151 Sepsis due to Escherichia coli [E. coli]: Principal | ICD-10-CM | POA: Diagnosis present

## 2016-09-04 DIAGNOSIS — K8031 Calculus of bile duct with cholangitis, unspecified, with obstruction: Secondary | ICD-10-CM | POA: Diagnosis present

## 2016-09-04 DIAGNOSIS — M549 Dorsalgia, unspecified: Secondary | ICD-10-CM

## 2016-09-04 DIAGNOSIS — K859 Acute pancreatitis without necrosis or infection, unspecified: Secondary | ICD-10-CM | POA: Diagnosis not present

## 2016-09-04 DIAGNOSIS — A419 Sepsis, unspecified organism: Secondary | ICD-10-CM | POA: Diagnosis present

## 2016-09-04 DIAGNOSIS — N186 End stage renal disease: Secondary | ICD-10-CM

## 2016-09-04 DIAGNOSIS — E875 Hyperkalemia: Secondary | ICD-10-CM | POA: Diagnosis present

## 2016-09-04 DIAGNOSIS — J189 Pneumonia, unspecified organism: Secondary | ICD-10-CM | POA: Diagnosis present

## 2016-09-04 DIAGNOSIS — E860 Dehydration: Secondary | ICD-10-CM | POA: Diagnosis present

## 2016-09-04 DIAGNOSIS — R7401 Elevation of levels of liver transaminase levels: Secondary | ICD-10-CM | POA: Diagnosis present

## 2016-09-04 DIAGNOSIS — Z833 Family history of diabetes mellitus: Secondary | ICD-10-CM | POA: Diagnosis not present

## 2016-09-04 DIAGNOSIS — R945 Abnormal results of liver function studies: Secondary | ICD-10-CM

## 2016-09-04 DIAGNOSIS — E1122 Type 2 diabetes mellitus with diabetic chronic kidney disease: Secondary | ICD-10-CM | POA: Diagnosis present

## 2016-09-04 DIAGNOSIS — N2581 Secondary hyperparathyroidism of renal origin: Secondary | ICD-10-CM | POA: Diagnosis present

## 2016-09-04 DIAGNOSIS — K219 Gastro-esophageal reflux disease without esophagitis: Secondary | ICD-10-CM | POA: Diagnosis present

## 2016-09-04 DIAGNOSIS — E119 Type 2 diabetes mellitus without complications: Secondary | ICD-10-CM

## 2016-09-04 DIAGNOSIS — R740 Nonspecific elevation of levels of transaminase and lactic acid dehydrogenase [LDH]: Secondary | ICD-10-CM

## 2016-09-04 DIAGNOSIS — R7989 Other specified abnormal findings of blood chemistry: Secondary | ICD-10-CM | POA: Diagnosis not present

## 2016-09-04 DIAGNOSIS — I10 Essential (primary) hypertension: Secondary | ICD-10-CM | POA: Diagnosis present

## 2016-09-04 DIAGNOSIS — K8050 Calculus of bile duct without cholangitis or cholecystitis without obstruction: Secondary | ICD-10-CM

## 2016-09-04 DIAGNOSIS — Z955 Presence of coronary angioplasty implant and graft: Secondary | ICD-10-CM

## 2016-09-04 DIAGNOSIS — I5032 Chronic diastolic (congestive) heart failure: Secondary | ICD-10-CM | POA: Diagnosis present

## 2016-09-04 LAB — CBC WITH DIFFERENTIAL/PLATELET
BASOS PCT: 0 %
Basophils Absolute: 0 10*3/uL (ref 0–0.1)
EOS ABS: 0 10*3/uL (ref 0–0.7)
EOS PCT: 0 %
HCT: 42.9 % (ref 40.0–52.0)
Hemoglobin: 13.4 g/dL (ref 13.0–18.0)
LYMPHS ABS: 0.4 10*3/uL — AB (ref 1.0–3.6)
LYMPHS PCT: 4 %
MCH: 23.4 pg — ABNORMAL LOW (ref 26.0–34.0)
MCHC: 31.2 g/dL — ABNORMAL LOW (ref 32.0–36.0)
MCV: 74.7 fL — ABNORMAL LOW (ref 80.0–100.0)
MONOS PCT: 7 %
Monocytes Absolute: 0.7 10*3/uL (ref 0.2–1.0)
NEUTROS ABS: 8.9 10*3/uL — AB (ref 1.4–6.5)
Neutrophils Relative %: 89 %
PLATELETS: 241 10*3/uL (ref 150–440)
RBC: 5.74 MIL/uL (ref 4.40–5.90)
RDW: 19.2 % — ABNORMAL HIGH (ref 11.5–14.5)
WBC: 10 10*3/uL (ref 3.8–10.6)

## 2016-09-04 LAB — COMPREHENSIVE METABOLIC PANEL
ALT: 456 U/L — AB (ref 17–63)
AST: 841 U/L — AB (ref 15–41)
Albumin: 4.1 g/dL (ref 3.5–5.0)
Alkaline Phosphatase: 336 U/L — ABNORMAL HIGH (ref 38–126)
Anion gap: 18 — ABNORMAL HIGH (ref 5–15)
BUN: 51 mg/dL — ABNORMAL HIGH (ref 6–20)
CHLORIDE: 93 mmol/L — AB (ref 101–111)
CO2: 26 mmol/L (ref 22–32)
CREATININE: 10.77 mg/dL — AB (ref 0.61–1.24)
Calcium: 8.4 mg/dL — ABNORMAL LOW (ref 8.9–10.3)
GFR, EST AFRICAN AMERICAN: 5 mL/min — AB (ref >60)
GFR, EST NON AFRICAN AMERICAN: 4 mL/min — AB (ref >60)
Glucose, Bld: 210 mg/dL — ABNORMAL HIGH (ref 65–99)
POTASSIUM: 4.1 mmol/L (ref 3.5–5.1)
SODIUM: 137 mmol/L (ref 135–145)
Total Bilirubin: 3.3 mg/dL — ABNORMAL HIGH (ref 0.3–1.2)
Total Protein: 8.9 g/dL — ABNORMAL HIGH (ref 6.5–8.1)

## 2016-09-04 LAB — AMMONIA: Ammonia: 34 umol/L (ref 9–35)

## 2016-09-04 LAB — PROTIME-INR
INR: 1.04
PROTHROMBIN TIME: 13.6 s (ref 11.4–15.2)

## 2016-09-04 LAB — INFLUENZA PANEL BY PCR (TYPE A & B)
INFLAPCR: NEGATIVE
Influenza B By PCR: NEGATIVE

## 2016-09-04 LAB — LACTIC ACID, PLASMA
LACTIC ACID, VENOUS: 4.5 mmol/L — AB (ref 0.5–1.9)
Lactic Acid, Venous: 5 mmol/L (ref 0.5–1.9)

## 2016-09-04 LAB — LIPASE, BLOOD: LIPASE: 50 U/L (ref 11–51)

## 2016-09-04 LAB — TROPONIN I: Troponin I: 0.03 ng/mL (ref <0.03)

## 2016-09-04 LAB — GLUCOSE, CAPILLARY: GLUCOSE-CAPILLARY: 162 mg/dL — AB (ref 65–99)

## 2016-09-04 MED ORDER — ALLOPURINOL 100 MG PO TABS
100.0000 mg | ORAL_TABLET | Freq: Every day | ORAL | Status: DC
  Administered 2016-09-05 – 2016-09-14 (×10): 100 mg via ORAL
  Filled 2016-09-04 (×11): qty 1

## 2016-09-04 MED ORDER — LEVETIRACETAM 250 MG PO TABS
250.0000 mg | ORAL_TABLET | ORAL | Status: DC
  Administered 2016-09-07 – 2016-09-14 (×2): 250 mg via ORAL
  Filled 2016-09-04 (×5): qty 1

## 2016-09-04 MED ORDER — ATORVASTATIN CALCIUM 20 MG PO TABS
40.0000 mg | ORAL_TABLET | Freq: Every day | ORAL | Status: DC
  Administered 2016-09-05 – 2016-09-13 (×9): 40 mg via ORAL
  Filled 2016-09-04 (×10): qty 2

## 2016-09-04 MED ORDER — SODIUM CHLORIDE 0.9 % IV BOLUS (SEPSIS)
500.0000 mL | Freq: Once | INTRAVENOUS | Status: AC
Start: 2016-09-04 — End: 2016-09-05
  Administered 2016-09-04: 500 mL via INTRAVENOUS

## 2016-09-04 MED ORDER — IPRATROPIUM-ALBUTEROL 0.5-2.5 (3) MG/3ML IN SOLN
3.0000 mL | Freq: Once | RESPIRATORY_TRACT | Status: AC
  Administered 2016-09-04: 3 mL via RESPIRATORY_TRACT
  Filled 2016-09-04: qty 3

## 2016-09-04 MED ORDER — SODIUM CHLORIDE 0.9 % IV BOLUS (SEPSIS)
500.0000 mL | Freq: Once | INTRAVENOUS | Status: AC
  Administered 2016-09-04: 500 mL via INTRAVENOUS

## 2016-09-04 MED ORDER — SODIUM CHLORIDE 0.9 % IV SOLN
INTRAVENOUS | Status: AC
  Administered 2016-09-04: via INTRAVENOUS

## 2016-09-04 MED ORDER — INSULIN ASPART 100 UNIT/ML ~~LOC~~ SOLN: 0.0000 [IU] | Freq: Every day | SUBCUTANEOUS | Status: DC

## 2016-09-04 MED ORDER — SODIUM CHLORIDE 0.9 % IV BOLUS (SEPSIS)
250.0000 mL | Freq: Once | INTRAVENOUS | Status: AC
  Administered 2016-09-04: 250 mL via INTRAVENOUS

## 2016-09-04 MED ORDER — DEXTROSE 5 % IV SOLN
1.0000 g | INTRAVENOUS | Status: DC
  Administered 2016-09-05: 1 g via INTRAVENOUS
  Filled 2016-09-04 (×2): qty 1

## 2016-09-04 MED ORDER — LEVETIRACETAM 500 MG PO TABS
1000.0000 mg | ORAL_TABLET | Freq: Every day | ORAL | Status: DC
  Administered 2016-09-05 – 2016-09-14 (×9): 1000 mg via ORAL
  Filled 2016-09-04 (×2): qty 2
  Filled 2016-09-04: qty 1
  Filled 2016-09-04 (×9): qty 2

## 2016-09-04 MED ORDER — ONDANSETRON HCL 4 MG/2ML IJ SOLN
4.0000 mg | Freq: Four times a day (QID) | INTRAMUSCULAR | Status: DC | PRN
  Administered 2016-09-10: 4 mg via INTRAVENOUS
  Filled 2016-09-04: qty 2

## 2016-09-04 MED ORDER — VANCOMYCIN HCL IN DEXTROSE 750-5 MG/150ML-% IV SOLN: 750.0000 mg | INTRAVENOUS | Status: DC | PRN

## 2016-09-04 MED ORDER — ONDANSETRON HCL 4 MG PO TABS: 4.0000 mg | ORAL_TABLET | Freq: Four times a day (QID) | ORAL | Status: DC | PRN

## 2016-09-04 MED ORDER — PROMETHAZINE HCL 25 MG/ML IJ SOLN
12.5000 mg | Freq: Once | INTRAMUSCULAR | Status: AC
  Administered 2016-09-04: 12.5 mg via INTRAVENOUS
  Filled 2016-09-04: qty 1

## 2016-09-04 MED ORDER — SODIUM CHLORIDE 0.9 % IV BOLUS (SEPSIS)
500.0000 mL | Freq: Once | INTRAVENOUS | Status: AC
Start: 2016-09-04 — End: 2016-09-04
  Administered 2016-09-04: 500 mL via INTRAVENOUS

## 2016-09-04 MED ORDER — HEPARIN SODIUM (PORCINE) 5000 UNIT/ML IJ SOLN
5000.0000 [IU] | Freq: Three times a day (TID) | INTRAMUSCULAR | Status: DC
  Administered 2016-09-05 – 2016-09-14 (×22): 5000 [IU] via SUBCUTANEOUS
  Filled 2016-09-04 (×22): qty 1

## 2016-09-04 MED ORDER — CLOPIDOGREL BISULFATE 75 MG PO TABS
75.0000 mg | ORAL_TABLET | Freq: Every day | ORAL | Status: DC
  Administered 2016-09-07: 75 mg via ORAL
  Filled 2016-09-04 (×2): qty 1

## 2016-09-04 MED ORDER — INSULIN ASPART 100 UNIT/ML ~~LOC~~ SOLN
0.0000 [IU] | Freq: Three times a day (TID) | SUBCUTANEOUS | Status: DC
  Administered 2016-09-05: 2 [IU] via SUBCUTANEOUS
  Administered 2016-09-06: 1 [IU] via SUBCUTANEOUS
  Administered 2016-09-06 – 2016-09-11 (×4): 2 [IU] via SUBCUTANEOUS
  Administered 2016-09-11 – 2016-09-12 (×2): 1 [IU] via SUBCUTANEOUS
  Filled 2016-09-04 (×4): qty 2
  Filled 2016-09-04 (×2): qty 1
  Filled 2016-09-04: qty 2
  Filled 2016-09-04: qty 1

## 2016-09-04 MED ORDER — ASPIRIN EC 81 MG PO TBEC
81.0000 mg | DELAYED_RELEASE_TABLET | Freq: Every day | ORAL | Status: DC
  Administered 2016-09-05 – 2016-09-14 (×10): 81 mg via ORAL
  Filled 2016-09-04 (×11): qty 1

## 2016-09-04 MED ORDER — VANCOMYCIN HCL 10 G IV SOLR
1500.0000 mg | Freq: Once | INTRAVENOUS | Status: AC
  Administered 2016-09-04: 1500 mg via INTRAVENOUS
  Filled 2016-09-04: qty 1500

## 2016-09-04 MED ORDER — DEXTROSE 5 % IV SOLN
1.0000 g | Freq: Once | INTRAVENOUS | Status: AC
  Administered 2016-09-05: 1 g via INTRAVENOUS
  Filled 2016-09-04: qty 1

## 2016-09-04 MED ORDER — PANTOPRAZOLE SODIUM 40 MG PO TBEC
40.0000 mg | DELAYED_RELEASE_TABLET | Freq: Every day | ORAL | Status: DC
  Administered 2016-09-05 – 2016-09-14 (×10): 40 mg via ORAL
  Filled 2016-09-04 (×11): qty 1

## 2016-09-04 NOTE — Progress Notes (Signed)
Called Dr. Jannifer Franklin regarding patient's lactic acid level of 5.0 and blood pressure of 90/50.  Appropriate orders were placed.  Christene Slates 09/04/2016  10:41 PM

## 2016-09-04 NOTE — ED Notes (Signed)
Patient transported to CT 

## 2016-09-04 NOTE — ED Triage Notes (Signed)
Pt comes into the ED via EMS from home c/o N/V, and lethargy.  Patient tachycardic between 100-110, CBG 230, atrial fibrillation (which is known), and axillary temp of 100.4.  Patient in NAD at this time with even and unlabored respirations.

## 2016-09-04 NOTE — ED Notes (Signed)
Radiology at bedside

## 2016-09-04 NOTE — H&P (Signed)
[name] NAME: [name]    MR#:  [id number]  DATE OF BIRTH:  04/10/1951  DATE OF ADMISSION:  09/04/2016  PRIMARY CARE PHYSICIAN: [name], MD   REQUESTING/REFERRING PHYSICIAN: Quentin Cornwall, MD  CHIEF COMPLAINT:   Chief Complaint  Patient presents with  . Emesis    HISTORY OF PRESENT ILLNESS:  [name]  is a 66 y.o. male who presents with Nausea, cough, malaise. These symptoms have been going on for a couple of days, patient also describes some episodes of chills at home. Patient was found here to have pneumonia and met sepsis criteria. Of uncertain etiology is patient's significantly elevated liver enzymes. Patient has had elevated LFTs in the past, and has had significant workup for this. However this time there much more elevated than previous values. Hospitalists were called for admission.  PAST MEDICAL HISTORY:   Past Medical History:  Diagnosis Date  . Chronic diastolic congestive heart failure ([place])   . Chronic disease anemia   . ESRD (end stage renal disease) on dialysis ([place])    "[place]; [place]; [name]" (09/29/2014)  . GERD (gastroesophageal reflux disease)   . Gout   . High cholesterol   . History of blood transfusion    "related to anemia"  . History of stomach ulcers   . Hypertension   . Type II diabetes mellitus ([place])     PAST SURGICAL HISTORY:   Past Surgical History:  Procedure Laterality Date  . ARTERIOVENOUS GRAFT PLACEMENT Left ~ 1996  . CARDIAC CATHETERIZATION  09/29/2014   "[place]"  . CARDIAC CATHETERIZATION N/A 02/26/2016   Procedure: Left Heart Cath and Coronary Angiography Possible PCI;  Surgeon: [name], MD;  Location: [place] [facility];  Service: Cardiovascular;  Laterality: N/A;  . KIDNEY TRANSPLANT Right 2004  . NEPHRECTOMY TRANSPLANTED ORGAN  2015  . PERCUTANEOUS CORONARY STENT INTERVENTION (PCI-S) N/A 09/30/2014   Procedure: PERCUTANEOUS CORONARY STENT  INTERVENTION (PCI-S);  Surgeon: [name], MD;  Location: [facility];  Service: Cardiovascular;  Laterality: N/A;  . PERITONEAL CATHETER INSERTION  02/2014  . PERITONEAL CATHETER REMOVAL  08/2014  . THROMBECTOMY / ARTERIOVENOUS GRAFT REVISION  2015    SOCIAL HISTORY:   Social History  Substance Use Topics  . Smoking status: Former Smoker    Types: Cigarettes  . Smokeless tobacco: Never Used     Comment: "quit smoking cigarettes in the 1980's"  . Alcohol use No    FAMILY HISTORY:   Family History  Problem Relation Age of Onset  . Hypertension Mother   . Stroke Mother   . Hypertension Father   . Diabetes Mellitus II Father   . Asthma Father     DRUG ALLERGIES:   Allergies  Allergen Reactions  . Ivp Dye [Iodinated Diagnostic Agents] Other (See Comments)    Pt denied    MEDICATIONS AT HOME:   Prior to Admission medications   Medication Sig Start Date End Date Taking? Authorizing Provider  acetaminophen (TYLENOL) 325 MG tablet Take 2 tablets (650 mg total) by mouth every 6 (six) hours as needed for mild pain (or Fever >/= 101). 02/27/16  Yes [name], MD  albuterol (PROVENTIL) (2.5 MG/3ML) 0.083% nebulizer solution Take 2.5 mg by nebulization every 6 (six) hours as needed for wheezing or shortness of breath.   Yes Historical Provider, MD  allopurinol (ZYLOPRIM) 100 MG tablet Take 100 mg by mouth daily. 09/23/14  Yes Historical Provider, MD  aspirin  81 MG EC tablet Take 81 mg by mouth daily. 09/23/14  Yes Historical Provider, MD  atorvastatin (LIPITOR) 40 MG tablet Take 1 tablet (40 mg total) by mouth daily at 6 PM. Patient taking differently: Take 80 mg by mouth daily at 6 PM.  11/09/14  Yes [name], MD  B Complex-C (B-COMPLEX WITH VITAMIN C) tablet Take 1 tablet by mouth daily.   Yes Historical Provider, MD  clopidogrel (PLAVIX) 75 MG tablet Take 1 tablet (75 mg total) by mouth daily with breakfast. 10/02/14  Yes [name], MD  epoetin alfa (EPOGEN,PROCRIT)  4000 UNIT/ML injection Inject 4,000 Units into the vein.   Yes Historical Provider, MD  levETIRAcetam (KEPPRA) 1000 MG tablet Take 1 tablet (1,000 mg total) by mouth daily. 07/29/16  Yes Henreitta Leber, MD  levETIRAcetam (KEPPRA) 250 MG tablet Take on the Days on Dialysis (Tues, Thurs, Sat) after Dialysis. 07/29/16  Yes Henreitta Leber, MD  meclizine (ANTIVERT) 12.5 MG tablet Take 12.5 mg by mouth 3 (three) times daily as needed. 04/22/16  Yes Historical Provider, MD  mirtazapine (REMERON) 7.5 MG tablet Take 7.5 mg by mouth at bedtime.   Yes Historical Provider, MD  nitroGLYCERIN (NITROGLYN) 2 % ointment Apply 0.5 inches topically every 6 (six) hours. 02/27/16  Yes [name], MD  nitroGLYCERIN (NITROSTAT) 0.4 MG SL tablet Place 1 tablet (0.4 mg total) under the tongue every 5 (five) minutes x 3 doses as needed for chest pain. 10/02/14  Yes [name], MD  pantoprazole (PROTONIX) 40 MG tablet Take 40 mg by mouth daily. 01/17/14  Yes Historical Provider, MD  heparin 100-0.45 UNIT/ML-% infusion Inject 1,000 Units/hr into the vein continuous. 02/27/16   [name], MD  Melatonin 3 MG TABS Take 3 mg by mouth at bedtime.    Historical Provider, MD  multivitamin (RENA-VIT) TABS tablet Take 1 tablet by mouth at bedtime. Patient not taking: Reported on 09/04/2016 10/02/14   [name], MD    REVIEW OF SYSTEMS:  Review of Systems  Constitutional: Positive for chills and malaise/fatigue. Negative for fever and weight loss.  HENT: Negative for ear pain, hearing loss and tinnitus.   Eyes: Negative for blurred vision, double vision, pain and redness.  Respiratory: Positive for cough. Negative for hemoptysis and shortness of breath.   Cardiovascular: Negative for chest pain, palpitations, orthopnea and leg swelling.  Gastrointestinal: Positive for nausea. Negative for abdominal pain, constipation, diarrhea and vomiting.  Genitourinary: Negative for dysuria, frequency and hematuria.   Musculoskeletal: Negative for back pain, joint pain and neck pain.  Skin:       No acne, rash, or lesions  Neurological: Negative for dizziness, tremors, focal weakness and weakness.  Endo/Heme/Allergies: Negative for polydipsia. Does not bruise/bleed easily.  Psychiatric/Behavioral: Negative for depression. The patient is not nervous/anxious and does not have insomnia.      VITAL SIGNS:   Vitals:   09/04/16 1820 09/04/16 1830 09/04/16 1900 09/04/16 1930  BP:  131/80 107/70 122/77  Pulse:  (!) 109 (!) 116 (!) 117  Resp:  (!) 24 (!) 24 19  Temp:      TempSrc:      SpO2:  94% 94% 93%  Weight: 73.1 kg (161 lb 1.6 oz)     Height: _0  (1.626 m)      Wt Readings from Last 3 Encounters:  09/04/16 73.1 kg (161 lb 1.6 oz)  07/27/16 72.2 kg (159 lb 2.8 oz)  04/20/16 71.7 kg (158 lb)    PHYSICAL  EXAMINATION:  Physical Exam  Vitals reviewed. Constitutional: He is oriented to person, place, and time. He appears well-developed and well-nourished. No distress.  HENT:  Head: Normocephalic and atraumatic.  Mouth/Throat: Oropharynx is clear and moist.  Eyes: Conjunctivae and EOM are normal. Pupils are equal, round, and reactive to light. No scleral icterus.  Neck: Normal range of motion. Neck supple. No JVD present. No thyromegaly present.  Cardiovascular: Normal rate, regular rhythm and intact distal pulses.  Exam reveals no gallop and no friction rub.   No murmur heard. Respiratory: Effort normal. No respiratory distress. He has no wheezes. He has no rales.  Left mid lung coarse breath sounds  GI: Soft. Bowel sounds are normal. He exhibits no distension. There is no tenderness.  Musculoskeletal: Normal range of motion. He exhibits no edema.  No arthritis, no gout  Lymphadenopathy:    He has no cervical adenopathy.  Neurological: He is alert and oriented to person, place, and time. No cranial nerve deficit.  No dysarthria, no aphasia  Skin: Skin is warm and dry. No rash noted. No  erythema.  Psychiatric: He has a normal mood and affect. His behavior is normal. Judgment and thought content normal.    LABORATORY PANEL:   CBC  Recent Labs Lab 09/04/16 1834  WBC 10.0  HGB 13.4  HCT 42.9  PLT 241   ------------------------------------------------------------------------------------------------------------------  Chemistries   Recent Labs Lab 09/04/16 1834  NA 137  K 4.1  CL 93*  CO2 26  GLUCOSE 210*  BUN 51*  CREATININE 10.77*  CALCIUM 8.4*  AST 841*  ALT 456*  ALKPHOS 336*  BILITOT 3.3*   ------------------------------------------------------------------------------------------------------------------  Cardiac Enzymes  Recent Labs Lab 09/04/16 1834  TROPONINI 0.03*   ------------------------------------------------------------------------------------------------------------------  RADIOLOGY:  Ct Chest Wo Contrast  Result Date: 09/04/2016 CLINICAL DATA:  Evaluate for pneumonia.  Sepsis. EXAM: CT CHEST WITHOUT CONTRAST TECHNIQUE: Multidetector CT imaging of the chest was performed following the standard protocol without IV contrast. COMPARISON:  None. FINDINGS: Cardiovascular: Previous median sternotomy and CABG procedure. Moderate cardiac enlargement. Aortic atherosclerosis. No pericardial effusion. Mediastinum/Nodes: No enlarged mediastinal or axillary lymph nodes. Thyroid gland, trachea, and esophagus demonstrate no significant findings. Lungs/Pleura: No pleural fluid. Atelectasis and airspace consolidation involving the left lower lobe is identified. Focal area of ground-glass attenuation in the right upper lobe measures 2.1 cm, image 43 of series 3. Upper Abdomen: No acute abnormality. Bilateral renal cortical atrophy identified. Compatible with known end-stage renal disease. 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 No chest wall mass or suspicious bone lesions identified. IMPRESSION: 1. Left  lower lobe atelectasis and consolidation compatible with suspected pneumonia and or aspiration. 2. Right upper lobe ground-glass density. Initial follow-up with CT at 6-12 months is recommended to confirm persistence. If persistent, repeat CT is recommended every 2 years until 5 years of stability has been established. This recommendation follows the consensus statement: Guidelines for Management of Incidental Pulmonary Nodules Detected on CT Images: From the Fleischner Society 2017; Radiology 2017; 284:228-243. Electronically Signed   By: Kerby Moors M.D.   On: 09/04/2016 20:05   Dg Chest Portable 1 View  Result Date: 09/04/2016 CLINICAL DATA:  Lethargy EXAM: PORTABLE CHEST 1 VIEW COMPARISON:  07/23/2016 FINDINGS: Right lung is grossly clear. Post sternotomy changes. Probable small left effusion. Dense consolidation at the lingula and left lung base, not significantly changed. Cardiomegaly. No pneumothorax. Vascular stent in the left upper extremity IMPRESSION: 1. Cardiomegaly 2. Dense consolidation in the lingula and left  lower lobe with probable small left effusion. Radiographic follow-up to resolution is advised. Electronically Signed   By: Donavan Foil M.D.   On: 09/04/2016 18:55    EKG:   Orders placed or performed during the hospital encounter of 09/04/16  . ED EKG  . ED EKG  . EKG 12-Lead  . EKG 12-Lead    IMPRESSION AND PLAN:  Principal Problem:   Sepsis ([place]) - IV antibiotics, lactic acid within normal limits, blood pressure stable, cultures sent from the ED. Active Problems:   Transaminitis - uncertain etiology. We will get a GI consult for further recommendations for workup   CAP (community acquired pneumonia) - antibiotics and cultures as above, when necessary duo nebs and antitussive   ESRD on dialysis [facility]) - nephrology consult for hemodialysis support   Diabetes ([place]) - sliding scale insulin with corresponding glucose checks   HTN (hypertension) - continue home meds    Chronic diastolic CHF (congestive heart failure) ([place]) - repeat echocardiogram, continue home meds   GERD (gastroesophageal reflux disease) - home dose PPI  All the records are reviewed and case discussed with ED provider. Management plans discussed with the patient and/or family.  DVT PROPHYLAXIS: SubQ heparin  GI PROPHYLAXIS: PPI  ADMISSION STATUS: Inpatient  CODE STATUS: Full Code Status History    Date Active Date Inactive Code Status Order ID Comments User Context   07/24/2016  2:19 AM 07/29/2016  8:15 PM Full Code [id number]  Harvie Bridge, DO Inpatient   02/26/2016  6:03 AM 02/27/2016  5:05 PM Full Code [id number]  Harrie Foreman, MD Inpatient   02/26/2016  5:40 AM 02/26/2016  6:03 AM DNR [id number]  Harrie Foreman, MD Inpatient   02/14/2015  8:16 AM 02/17/2015  1:47 PM DNR [id number]  Epifanio Lesches, MD ED   12/12/2014  8:44 PM 12/16/2014  7:26 PM DNR [id number]  Loletha Grayer, MD ED   11/05/2014 10:09 PM 11/09/2014  9:48 PM Full Code [id number]  Henreitta Leber, MD Inpatient   09/30/2014  1:48 PM 10/02/2014  7:02 PM Full Code [id number]  [name], MD Inpatient   09/29/2014  8:36 PM 09/30/2014  1:48 PM Full Code [id number]  [name], MD Inpatient      TOTAL TIME TAKING CARE OF THIS PATIENT: 45 minutes.    [name] 09/04/2016, 9:25 PM  [facility]  Office  [phone]  CC: Primary care physician; [name], MD

## 2016-09-04 NOTE — Progress Notes (Signed)
Pharmacy Antibiotic Note  [name] is a 66 y.o. male admitted on 09/04/2016 with pneumonia.  Pharmacy has been consulted for vancomycin and cefepime dosing.  Plan: HD patient Vancomycin 1500 mg X1 loading dose ordered with 750 mg every HD. Will need to have level ordered when HD schedule determined.  Cefepime 1 gram q 24 hours ordered.  Height: 4\' 9"  (144.8 cm) Weight: 155 lb 8 oz (70.5 kg) IBW/kg (Calculated) : 43.1  Temp (24hrs), Avg:98.1 F (36.7 C), Min:97.7 F (36.5 C), Max:98.4 F (36.9 C)   Recent Labs Lab 09/04/16 1834 09/04/16 2101  WBC 10.0  --   CREATININE 10.77*  --   LATICACIDVEN 4.5* 5.0*    Estimated Creatinine Clearance: 5.2 mL/min (A) (by C-G formula based on SCr of 10.77 mg/dL (H)).    Allergies  Allergen Reactions  . Ivp Dye [Iodinated Diagnostic Agents] Other (See Comments)    Pt denied    Antimicrobials this admission: Vancomycin, cefepime 3/21 >>    >>   Dose adjustments this admission:   Microbiology results: 3/21 BCx: pending 3/21 MRSA PCR: pending     3/21 UA: pending 3/21 CXR: lingular and LLL consolidation Thank you for allowing pharmacy to be a part of this patient's care.  Damarys Speir S 09/04/2016 11:20 PM

## 2016-09-04 NOTE — ED Provider Notes (Signed)
[facility] Emergency Department Provider Note    None    (approximate)  I have reviewed the triage vital signs and the nursing notes.   HISTORY  Chief Complaint Emesis  Level V Caveat:  Acute metabolic encephalopathy  HPI [name] is a 66 y.o. male with multiple comorbidities as well as end-stage renal disease on Tuesday Thursday Saturday dialysis presents with 1 day of nausea vomitingand fatigue. EMS found patient with mild tachycardia and reported axillary temp of 100.4. Patient denies any pain but is very drowsy and unable to provide much history.   Past Medical History:  Diagnosis Date  . Chronic diastolic congestive heart failure ([place])   . Chronic disease anemia   . ESRD (end stage renal disease) on dialysis ([place])    "[place]; [place]; [name]" (09/29/2014)  . GERD (gastroesophageal reflux disease)   . Gout   . High cholesterol   . History of blood transfusion    "related to anemia"  . History of stomach ulcers   . Hypertension   . Type II diabetes mellitus (HCC)    Family History  Problem Relation Age of Onset  . Hypertension Mother   . Stroke Mother   . Hypertension Father   . Diabetes Mellitus II Father   . Asthma Father    Past Surgical History:  Procedure Laterality Date  . ARTERIOVENOUS GRAFT PLACEMENT Left ~ 1996  . CARDIAC CATHETERIZATION  09/29/2014   "[place]"  . CARDIAC CATHETERIZATION N/A 02/26/2016   Procedure: Left Heart Cath and Coronary Angiography Possible PCI;  Surgeon: [name], MD;  Location: [facility];  Service: Cardiovascular;  Laterality: N/A;  . KIDNEY TRANSPLANT Right 2004  . NEPHRECTOMY TRANSPLANTED ORGAN  2015  . PERCUTANEOUS CORONARY STENT INTERVENTION (PCI-S) N/A 09/30/2014   Procedure: PERCUTANEOUS CORONARY STENT INTERVENTION (PCI-S);  Surgeon: [name], MD;  Location: [facility];  Service: Cardiovascular;  Laterality: N/A;  . PERITONEAL CATHETER INSERTION  02/2014  .  PERITONEAL CATHETER REMOVAL  08/2014  . THROMBECTOMY / ARTERIOVENOUS GRAFT REVISION  2015   Patient Active Problem List   Diagnosis Date Noted  . Encephalopathy acute   . HCAP (healthcare-associated pneumonia) 07/24/2016  . NSTEMI (non-ST elevated myocardial infarction) ([place]) 02/26/2016  . Acute respiratory failure with hypoxia ([place]) 02/14/2015  . Syncope 11/05/2014  . Contaminant given to patient   . Shortness of breath   . Viridans streptococci infection   . Polymicrobial bacterial infection   . Bacteremia   . 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 ESRD on dialysis ([place])   . 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 Acute coronary syndrome ([place]) 09/29/2014      Prior to Admission medications   Medication Sig Start Date End Date Taking? Authorizing Provider  acetaminophen (TYLENOL) 325 MG tablet Take 2 tablets (650 mg total) by mouth every 6 (six) hours as needed for mild pain (or Fever >/= 101). 02/27/16  Yes [name], MD  albuterol (PROVENTIL) (2.5 MG/3ML) 0.083% nebulizer solution Take 2.5 mg by nebulization every 6 (six) hours as needed for wheezing or shortness of breath.   Yes Historical Provider, MD  allopurinol (ZYLOPRIM) 100 MG tablet Take 100 mg by mouth daily. 09/23/14  Yes Historical Provider, MD  aspirin 81 MG EC tablet Take 81 mg by mouth daily. 09/23/14  Yes Historical Provider, MD  atorvastatin (LIPITOR) 40 MG tablet Take 1 tablet (40 mg total) by  mouth daily at 6 PM. Patient taking differently: Take 80 mg by mouth daily at 6 PM.  11/09/14  Yes [name], MD  B Complex-C (B-COMPLEX WITH VITAMIN C) tablet Take 1 tablet by mouth daily.   Yes Historical Provider, MD  clopidogrel (PLAVIX) 75 MG tablet Take 1 tablet (75 mg total) by mouth daily with breakfast. 10/02/14  Yes [name], MD  epoetin alfa (EPOGEN,PROCRIT) 4000 UNIT/ML injection Inject 4,000 Units into the vein.   Yes Historical  Provider, MD  levETIRAcetam (KEPPRA) 1000 MG tablet Take 1 tablet (1,000 mg total) by mouth daily. 07/29/16  Yes Henreitta Leber, MD  levETIRAcetam (KEPPRA) 250 MG tablet Take on the Days on Dialysis (Tues, Thurs, Sat) after Dialysis. 07/29/16  Yes Henreitta Leber, MD  meclizine (ANTIVERT) 12.5 MG tablet Take 12.5 mg by mouth 3 (three) times daily as needed. 04/22/16  Yes Historical Provider, MD  mirtazapine (REMERON) 7.5 MG tablet Take 7.5 mg by mouth at bedtime.   Yes Historical Provider, MD  nitroGLYCERIN (NITROGLYN) 2 % ointment Apply 0.5 inches topically every 6 (six) hours. 02/27/16  Yes [name], MD  nitroGLYCERIN (NITROSTAT) 0.4 MG SL tablet Place 1 tablet (0.4 mg total) under the tongue every 5 (five) minutes x 3 doses as needed for chest pain. 10/02/14  Yes [name], MD  pantoprazole (PROTONIX) 40 MG tablet Take 40 mg by mouth daily. 01/17/14  Yes Historical Provider, MD  heparin 100-0.45 UNIT/ML-% infusion Inject 1,000 Units/hr into the vein continuous. 02/27/16   [name], MD  Melatonin 3 MG TABS Take 3 mg by mouth at bedtime.    Historical Provider, MD  multivitamin (RENA-VIT) TABS tablet Take 1 tablet by mouth at bedtime. Patient not taking: Reported on 09/04/2016 10/02/14   [name], MD    Allergies Ivp dye [iodinated diagnostic agents]    Social History Social History  Substance Use Topics  . Smoking status: Former Smoker    Types: Cigarettes  . Smokeless tobacco: Never Used     Comment: "quit smoking cigarettes in the 1980's"  . Alcohol use No    Review of Systems Patient denies headaches, rhinorrhea, blurry vision, numbness, shortness of breath, chest pain, edema, cough, abdominal pain, nausea, vomiting, diarrhea, dysuria, fevers, rashes or hallucinations unless otherwise stated above in HPI. ____________________________________________   PHYSICAL EXAM:  VITAL SIGNS: Vitals:   09/04/16 1900 09/04/16 1930  BP: 107/70 122/77  Pulse: (!) 116  (!) 117  Resp: (!) 24 19  Temp:      Constitutional: Alert ill appearing but  in no acute distress.  Eyes: Conjunctivae are normal. PERRL. EOMI. Head: Atraumatic. Nose: No congestion/rhinnorhea. Mouth/Throat: Mucous membranes are moist.  Oropharynx non-erythematous. Neck: No stridor. Painless ROM. No cervical spine tenderness to palpation Hematological/Lymphatic/Immunilogical: No cervical lymphadenopathy. Cardiovascular: Normal rate, regular rhythm. Grossly normal heart sounds.  Good peripheral circulation. Respiratory: mild tachypnea, rhonchorus breath sounds in LLQ Gastrointestinal: Soft and nontender. No distention. No abdominal bruits. No CVA tenderness. Musculoskeletal: No lower extremity tenderness nor edema.  No joint effusions. Neurologic:  Drowsy but MAE spontaneously Skin:  Skin is cool, dry and intact. No rash noted. Psychiatric: Mood and affect are normal. Speech and behavior are normal.  ____________________________________________   LABS (all labs ordered are listed, but only abnormal results are displayed)  Results for orders placed or performed during the hospital encounter of 09/04/16 (from the past 24 hour(s))  CBC with Differential/Platelet     Status: Abnormal   Collection Time: 09/04/16  6:34 PM  Result Value Ref Range   WBC 10.0 3.8 - 10.6 K/uL   RBC 5.74 4.40 - 5.90 MIL/uL   Hemoglobin 13.4 13.0 - 18.0 g/dL   HCT 42.9 40.0 - 52.0 %   MCV 74.7 (L) 80.0 - 100.0 fL   MCH 23.4 (L) 26.0 - 34.0 pg   MCHC 31.2 (L) 32.0 - 36.0 g/dL   RDW 19.2 (H) 11.5 - 14.5 %   Platelets 241 150 - 440 K/uL   Neutrophils Relative % 89 %   Neutro Abs 8.9 (H) 1.4 - 6.5 K/uL   Lymphocytes Relative 4 %   Lymphs Abs 0.4 (L) 1.0 - 3.6 K/uL   Monocytes Relative 7 %   Monocytes Absolute 0.7 0.2 - 1.0 K/uL   Eosinophils Relative 0 %   Eosinophils Absolute 0.0 0 - 0.7 K/uL   Basophils Relative 0 %   Basophils Absolute 0.0 0 - 0.1 K/uL  Comprehensive metabolic panel     Status:  Abnormal   Collection Time: 09/04/16  6:34 PM  Result Value Ref Range   Sodium 137 135 - 145 mmol/L   Potassium 4.1 3.5 - 5.1 mmol/L   Chloride 93 (L) 101 - 111 mmol/L   CO2 26 22 - 32 mmol/L   Glucose, Bld 210 (H) 65 - 99 mg/dL   BUN 51 (H) 6 - 20 mg/dL   Creatinine, Ser 10.77 (H) 0.61 - 1.24 mg/dL   Calcium 8.4 (L) 8.9 - 10.3 mg/dL   Total Protein 8.9 (H) 6.5 - 8.1 g/dL   Albumin 4.1 3.5 - 5.0 g/dL   AST 841 (H) 15 - 41 U/L   ALT 456 (H) 17 - 63 U/L   Alkaline Phosphatase 336 (H) 38 - 126 U/L   Total Bilirubin 3.3 (H) 0.3 - 1.2 mg/dL   GFR calc non Af Amer 4 (L) >60 mL/min   GFR calc Af Amer 5 (L) >60 mL/min   Anion gap 18 (H) 5 - 15  Lipase, blood     Status: None   Collection Time: 09/04/16  6:34 PM  Result Value Ref Range   Lipase 50 11 - 51 U/L  Lactic acid, plasma     Status: Abnormal   Collection Time: 09/04/16  6:34 PM  Result Value Ref Range   Lactic Acid, Venous 4.5 (HH) 0.5 - 1.9 mmol/L  Troponin I     Status: Abnormal   Collection Time: 09/04/16  6:34 PM  Result Value Ref Range   Troponin I 0.03 (HH) <0.03 ng/mL  Ammonia     Status: None   Collection Time: 09/04/16  6:34 PM  Result Value Ref Range   Ammonia 34 9 - 35 umol/L  Influenza panel by PCR (type A & B)     Status: None   Collection Time: 09/04/16  7:14 PM  Result Value Ref Range   Influenza A By PCR NEGATIVE NEGATIVE   Influenza B By PCR NEGATIVE NEGATIVE   ____________________________________________  EKG My review and personal interpretation at Time: 18:16   Indication: n/v  Rate: 105  Rhythm: sinus Axis: normal Other: normal intervals, no acute ischemia ____________________________________________  RADIOLOGY  I personally reviewed all radiographic images ordered to evaluate for the above acute complaints and reviewed radiology reports and findings.  These findings were personally discussed with the patient.  Please see medical record for radiology  report.  ____________________________________________   PROCEDURES  Procedure(s) performed:  Procedures    Critical Care performed: yes CRITICAL  CARE Performed by: Merlyn Lot   Total critical care time: 50 minutes  Critical care time was exclusive of separately billable procedures and treating other patients.  Critical care was necessary to treat or prevent imminent or life-threatening deterioration.  Critical care was time spent personally by me on the following activities: development of treatment plan with patient and/or surrogate as well as nursing, discussions with consultants, evaluation of patient's response to treatment, examination of patient, obtaining history from patient or surrogate, ordering and performing treatments and interventions, ordering and review of laboratory studies, ordering and review of radiographic studies, pulse oximetry and re-evaluation of patient's condition.  ____________________________________________   INITIAL IMPRESSION / ASSESSMENT AND PLAN / ED COURSE  Pertinent labs & imaging results that were available during my care of the patient were reviewed by me and considered in my medical decision making (see chart for details).  DDX: sepsis, dehydration, chf, electrolye abn,   [name] is a 66 y.o. who presents to the ED with nausea vomiting and fatigue as described above. Afebrile in the ER but had axillary temperature to 100.4. Patient tachycardic and tachypnic. He is very ill-appearing but is protecting his airway right now. Based on his presentation I am concerned for sepsis. We'll provide gentle IV hydration as the patient does have a history of congestive heart failure and is on dialysis. The patient will be placed on continuous pulse oximetry and telemetry for monitoring.  Laboratory evaluation will be sent to evaluate for the above complaints.     Clinical Course as of Sep 04 2101  Wed Sep 04, 2016  1939 Noted elevation of  Lactic acid and LFTs.  Will order [place] and ct chest to evaluate for hepatobiliary process vs pna  [PR]  2047 Patient recheck. CT chest shows confirmation of consolidation in the left lung fields concerning for pneumonia. On repeat abdominal exam patient has no abdominal pain. He denies any alcohol ingestion. We'll continue with IV fluids as well as antibiotic treatment for pneumonia with sepsis.  Have a lower suspicion for any acute surgical process at this time.   [PR]  2101 At this point on review of his medical workup do feel the most likely oxygenation for his acute presentation is pneumonia. LFTs may be secondary to hepatorenal or hepatocardiorenal syndromes. Recent hepatitis lab work was unremarkable.  I spoke with Dr. Jannifer Franklin regarding his patient and he agrees with very judicious IV fluids. I will repeat a lactate at this point. Antibiotics infusing. He has kindly accepted patient for admission for further evaluation.  Have discussed with the patient and available family all diagnostics and treatments performed thus far and all questions were answered to the best of my ability. The patient demonstrates understanding and agreement with plan.   [PR]    Clinical Course User Index [PR] Merlyn Lot, MD     ____________________________________________   FINAL CLINICAL IMPRESSION(S) / ED DIAGNOSES  Final diagnoses:  Elevated LFTs  HCAP (healthcare-associated pneumonia)  Sepsis, due to unspecified organism ([place])  Lactic acidosis      NEW MEDICATIONS STARTED DURING THIS VISIT:  New Prescriptions   No medications on file     Note:  This document was prepared using Dragon voice recognition software and may include unintentional dictation errors.    Merlyn Lot, MD 09/04/16 2106

## 2016-09-05 ENCOUNTER — Inpatient Hospital Stay: Payer: Medicare Other

## 2016-09-05 LAB — BASIC METABOLIC PANEL
ANION GAP: 15 (ref 5–15)
BUN: 59 mg/dL — ABNORMAL HIGH (ref 6–20)
CHLORIDE: 99 mmol/L — AB (ref 101–111)
CO2: 24 mmol/L (ref 22–32)
Calcium: 7.5 mg/dL — ABNORMAL LOW (ref 8.9–10.3)
Creatinine, Ser: 11.4 mg/dL — ABNORMAL HIGH (ref 0.61–1.24)
GFR calc Af Amer: 5 mL/min — ABNORMAL LOW (ref >60)
GFR, EST NON AFRICAN AMERICAN: 4 mL/min — AB (ref >60)
GLUCOSE: 142 mg/dL — AB (ref 65–99)
POTASSIUM: 6.9 mmol/L — AB (ref 3.5–5.1)
Sodium: 138 mmol/L (ref 135–145)

## 2016-09-05 LAB — MRSA PCR SCREENING: MRSA by PCR: NEGATIVE

## 2016-09-05 LAB — BLOOD CULTURE ID PANEL (REFLEXED)
Acinetobacter baumannii: NOT DETECTED
CANDIDA GLABRATA: NOT DETECTED
Candida albicans: NOT DETECTED
Candida krusei: NOT DETECTED
Candida parapsilosis: NOT DETECTED
Candida tropicalis: NOT DETECTED
Carbapenem resistance: NOT DETECTED
ENTEROBACTER CLOACAE COMPLEX: NOT DETECTED
Enterobacteriaceae species: DETECTED — AB
Enterococcus species: NOT DETECTED
Escherichia coli: DETECTED — AB
HAEMOPHILUS INFLUENZAE: NOT DETECTED
KLEBSIELLA PNEUMONIAE: NOT DETECTED
Klebsiella oxytoca: NOT DETECTED
Listeria monocytogenes: NOT DETECTED
NEISSERIA MENINGITIDIS: NOT DETECTED
PROTEUS SPECIES: NOT DETECTED
Pseudomonas aeruginosa: NOT DETECTED
SERRATIA MARCESCENS: NOT DETECTED
STAPHYLOCOCCUS SPECIES: NOT DETECTED
STREPTOCOCCUS SPECIES: NOT DETECTED
Staphylococcus aureus (BCID): NOT DETECTED
Streptococcus agalactiae: NOT DETECTED
Streptococcus pneumoniae: NOT DETECTED
Streptococcus pyogenes: NOT DETECTED

## 2016-09-05 LAB — GLUCOSE, CAPILLARY
GLUCOSE-CAPILLARY: 117 mg/dL — AB (ref 65–99)
GLUCOSE-CAPILLARY: 159 mg/dL — AB (ref 65–99)
Glucose-Capillary: 87 mg/dL (ref 65–99)
Glucose-Capillary: 128 mg/dL — ABNORMAL HIGH (ref 65–99)

## 2016-09-05 LAB — CBC
HEMATOCRIT: 36.8 % — AB (ref 40.0–52.0)
HEMOGLOBIN: 11.5 g/dL — AB (ref 13.0–18.0)
MCH: 23.6 pg — ABNORMAL LOW (ref 26.0–34.0)
MCHC: 31.4 g/dL — AB (ref 32.0–36.0)
MCV: 75.1 fL — ABNORMAL LOW (ref 80.0–100.0)
Platelets: 223 10*3/uL (ref 150–440)
RBC: 4.89 MIL/uL (ref 4.40–5.90)
RDW: 19.4 % — ABNORMAL HIGH (ref 11.5–14.5)
WBC: 17.1 10*3/uL — ABNORMAL HIGH (ref 3.8–10.6)

## 2016-09-05 LAB — TROPONIN I: TROPONIN I: 0.03 ng/mL — AB (ref <0.03)

## 2016-09-05 LAB — LACTIC ACID, PLASMA: Lactic Acid, Venous: 5.7 mmol/L (ref 0.5–1.9)

## 2016-09-05 MED ORDER — INSULIN REGULAR HUMAN 100 UNIT/ML IJ SOLN
10.0000 [IU] | Freq: Once | INTRAMUSCULAR | Status: AC
Start: 2016-09-05 — End: 2016-09-05
  Administered 2016-09-05: 10 [IU] via INTRAVENOUS
  Filled 2016-09-05: qty 0.1

## 2016-09-05 MED ORDER — PUREFLOW DIALYSIS SOLUTION: INTRAVENOUS | Status: DC

## 2016-09-05 MED ORDER — HEPARIN SODIUM (PORCINE) 1000 UNIT/ML DIALYSIS: 1000.0000 [IU] | INTRAMUSCULAR | Status: DC | PRN

## 2016-09-05 MED ORDER — DEXTROSE 50 % IV SOLN
25.0000 mL | Freq: Once | INTRAVENOUS | Status: AC
  Administered 2016-09-05: 25 mL via INTRAVENOUS
  Filled 2016-09-05: qty 50

## 2016-09-05 MED ORDER — SODIUM CHLORIDE 0.9 % IV SOLN: INTRAVENOUS | Status: DC

## 2016-09-05 MED ORDER — SODIUM POLYSTYRENE SULFONATE 15 GM/60ML PO SUSP
30.0000 g | Freq: Once | ORAL | Status: AC
  Administered 2016-09-05: 30 g via ORAL
  Filled 2016-09-05: qty 120

## 2016-09-05 NOTE — Progress Notes (Signed)
Post hd vitals 

## 2016-09-05 NOTE — Progress Notes (Signed)
Called Dr. Marcille Blanco regarding patient's lactic acid level of 5.7.  Instructed by doctor to increase maintenance fluids to 125 mL/hr.  Phoebe Sharps N  09/05/2016  3:04 AM

## 2016-09-05 NOTE — Progress Notes (Signed)
PHARMACY - PHYSICIAN COMMUNICATION CRITICAL VALUE ALERT - BLOOD CULTURE IDENTIFICATION (BCID)  Results for orders placed or performed during the hospital encounter of 09/04/16  Blood Culture ID Panel (Reflexed) (Collected: 09/04/2016  9:01 PM)  Result Value Ref Range   Enterococcus species NOT DETECTED NOT DETECTED   Listeria monocytogenes NOT DETECTED NOT DETECTED   Staphylococcus species NOT DETECTED NOT DETECTED   Staphylococcus aureus NOT DETECTED NOT DETECTED   Streptococcus species NOT DETECTED NOT DETECTED   Streptococcus agalactiae NOT DETECTED NOT DETECTED   Streptococcus pneumoniae NOT DETECTED NOT DETECTED   Streptococcus pyogenes NOT DETECTED NOT DETECTED   Acinetobacter baumannii NOT DETECTED NOT DETECTED   Enterobacteriaceae species DETECTED (A) NOT DETECTED   Enterobacter cloacae complex NOT DETECTED NOT DETECTED   Escherichia coli DETECTED (A) NOT DETECTED   Klebsiella oxytoca NOT DETECTED NOT DETECTED   Klebsiella pneumoniae NOT DETECTED NOT DETECTED   Proteus species NOT DETECTED NOT DETECTED   Serratia marcescens NOT DETECTED NOT DETECTED   Carbapenem resistance NOT DETECTED NOT DETECTED   Haemophilus influenzae NOT DETECTED NOT DETECTED   Neisseria meningitidis NOT DETECTED NOT DETECTED   Pseudomonas aeruginosa NOT DETECTED NOT DETECTED   Candida albicans NOT DETECTED NOT DETECTED   Candida glabrata NOT DETECTED NOT DETECTED   Candida krusei NOT DETECTED NOT DETECTED   Candida parapsilosis NOT DETECTED NOT DETECTED   Candida tropicalis NOT DETECTED NOT DETECTED    Name of physician (or Provider) Contacted: Mody  Changes to prescribed antibiotics required: Per MD continue current abxs since patient is improving clinically.    Knut Rondinelli D 09/05/2016  2:31 PM

## 2016-09-05 NOTE — Progress Notes (Signed)
  End of hd 

## 2016-09-05 NOTE — Progress Notes (Signed)
Dr. Benjie Karvonen called and notified of patients potassium of 6.9, lactic acid of 5.7. Received orders to recheck potassium. Order placed as requested

## 2016-09-05 NOTE — Progress Notes (Signed)
Pre hd info 

## 2016-09-05 NOTE — Progress Notes (Signed)
[facility]  ROUNDING NOTE   Subjective:   [name] admitted to [facility] on 09/04/2016 for Lactic acidosis [E87.2] Elevated LFTs [R79.89] HCAP (healthcare-associated pneumonia) [J18.9] Sepsis, due to unspecified organism [facility]) [A41.9]   Patient complains of back pain.   Dialysis today. Tolerated treatment well.   Objective:  Vital signs in last 24 hours:  Temp:  [97.5 F (36.4 C)-98.9 F (37.2 C)] 97.5 F (36.4 C) (03/22 1400) Pulse Rate:  [43-117] 98 (03/22 1400) Resp:  [16-25] 20 (03/22 1400) BP: (75-139)/(46-109) 132/59 (03/22 1400) SpO2:  [93 %-100 %] 100 % (03/22 1400) Weight:  [70.5 kg (155 lb 8 oz)-73.9 kg (162 lb 14.7 oz)] 73 kg (160 lb 15 oz) (03/22 1325)  Weight change:  Filed Weights   09/05/16 0402 09/05/16 1020 09/05/16 1325  Weight: 73.2 kg (161 lb 6.4 oz) 73.9 kg (162 lb 14.7 oz) 73 kg (160 lb 15 oz)    Intake/Output: No intake/output data recorded.   Intake/Output this shift:  Total I/O In: -  Out: 1000 [Other:1000]  Physical Exam: General: NAD,   Head: Normocephalic, atraumatic. Moist oral mucosal membranes  Eyes: Anicteric, PERRL  Neck: Supple, trachea midline  Lungs:  Clear to auscultation  Heart: Regular rate and rhythm  Abdomen:  Soft, nontender,   Extremities: 1+ peripheral edema.  Neurologic: Nonfocal, moving all four extremities  Skin: No lesions  Access: Left AVF    Basic Metabolic Panel:  Recent Labs Lab 09/04/16 1834 09/05/16 0607  NA 137 138  K 4.1 6.9*  CL 93* 99*  CO2 26 24  GLUCOSE 210* 142*  BUN 51* 59*  CREATININE 10.77* 11.40*  CALCIUM 8.4* 7.5*    Liver Function Tests:  Recent Labs Lab 09/04/16 1834  AST 841*  ALT 456*  ALKPHOS 336*  BILITOT 3.3*  PROT 8.9*  ALBUMIN 4.1    Recent Labs Lab 09/04/16 1834  LIPASE 50    Recent Labs Lab 09/04/16 1834  AMMONIA 34    CBC:  Recent Labs Lab 09/04/16 1834 09/05/16 0607  WBC 10.0 17.1*  NEUTROABS 8.9*  --   HGB 13.4 11.5*   HCT 42.9 36.8*  MCV 74.7* 75.1*  PLT 241 223    Cardiac Enzymes:  Recent Labs Lab 09/04/16 1834 09/05/16 0104 09/05/16 0607  TROPONINI 0.03* 0.03* 0.03*    BNP: Invalid input(s): POCBNP  CBG:  Recent Labs Lab 09/04/16 2344 09/05/16 0747 09/05/16 1408  GLUCAP 162* 117* 20    Microbiology: Results for orders placed or performed during the hospital encounter of 09/04/16  Blood culture (routine x 2)     Status: None (Preliminary result)   Collection Time: 09/04/16  9:01 PM  Result Value Ref Range Status   Specimen Description BLOOD RIGHT FOREARM  Final   Special Requests BOTTLES DRAWN AEROBIC AND ANAEROBIC  BCHV  Final   Culture  Setup Time   Final    GRAM NEGATIVE RODS IN BOTH AEROBIC AND ANAEROBIC BOTTLES CRITICAL RESULT CALLED TO, READ BACK BY AND VERIFIED WITH: HANK ZOMPA 09/05/16 1128 SGD    Culture GRAM NEGATIVE RODS  Final   Report Status PENDING  Incomplete  Blood culture (routine x 2)     Status: None (Preliminary result)   Collection Time: 09/04/16  9:01 PM  Result Value Ref Range Status   Specimen Description BLOOD RIGHT ANTECUBITAL  Final   Special Requests BOTTLES DRAWN AEROBIC AND ANAEROBIC  [place]  Final   Culture  Setup Time   Final  Organism ID to follow GRAM NEGATIVE RODS IN BOTH AEROBIC AND ANAEROBIC BOTTLES CRITICAL RESULT CALLED TO, READ BACK BY AND VERIFIED WITH: HANK ZOMPA 09/05/16 1128 SGD    Culture GRAM NEGATIVE RODS  Final   Report Status PENDING  Incomplete  Blood Culture ID Panel (Reflexed)     Status: Abnormal   Collection Time: 09/04/16  9:01 PM  Result Value Ref Range Status   Enterococcus species NOT DETECTED NOT DETECTED Final   Listeria monocytogenes NOT DETECTED NOT DETECTED Final   Staphylococcus species NOT DETECTED NOT DETECTED Final   Staphylococcus aureus NOT DETECTED NOT DETECTED Final   Streptococcus species NOT DETECTED NOT DETECTED Final   Streptococcus agalactiae NOT DETECTED NOT DETECTED Final   Streptococcus  pneumoniae NOT DETECTED NOT DETECTED Final   Streptococcus pyogenes NOT DETECTED NOT DETECTED Final   Acinetobacter baumannii NOT DETECTED NOT DETECTED Final   Enterobacteriaceae species DETECTED (A) NOT DETECTED Final    Comment: Enterobacteriaceae represent a large family of gram-negative bacteria, not a single organism. CRITICAL RESULT CALLED TO, READ BACK BY AND VERIFIED WITH: HANK ZOMPA 09/05/16 1128 SGD    Enterobacter cloacae complex NOT DETECTED NOT DETECTED Final   Escherichia coli DETECTED (A) NOT DETECTED Final    Comment: CRITICAL RESULT CALLED TO, READ BACK BY AND VERIFIED WITH: HANK ZOMPA 09/05/16 1128 SGD    Klebsiella oxytoca NOT DETECTED NOT DETECTED Final   Klebsiella pneumoniae NOT DETECTED NOT DETECTED Final   Proteus species NOT DETECTED NOT DETECTED Final   Serratia marcescens NOT DETECTED NOT DETECTED Final   Carbapenem resistance NOT DETECTED NOT DETECTED Final   Haemophilus influenzae NOT DETECTED NOT DETECTED Final   Neisseria meningitidis NOT DETECTED NOT DETECTED Final   Pseudomonas aeruginosa NOT DETECTED NOT DETECTED Final   Candida albicans NOT DETECTED NOT DETECTED Final   Candida glabrata NOT DETECTED NOT DETECTED Final   Candida krusei NOT DETECTED NOT DETECTED Final   Candida parapsilosis NOT DETECTED NOT DETECTED Final   Candida tropicalis NOT DETECTED NOT DETECTED Final  MRSA PCR Screening     Status: None   Collection Time: 09/04/16 11:14 PM  Result Value Ref Range Status   MRSA by PCR NEGATIVE NEGATIVE Final    Comment:        The GeneXpert MRSA Assay (FDA approved for NASAL specimens only), is one component of a comprehensive MRSA colonization surveillance program. It is not intended to diagnose MRSA infection nor to guide or monitor treatment for MRSA infections.     Coagulation Studies:  Recent Labs  09/04/16 2101  LABPROT 13.6  INR 1.04    Urinalysis: No results for input(s): COLORURINE, LABSPEC, PHURINE, GLUCOSEU, HGBUR,  BILIRUBINUR, KETONESUR, PROTEINUR, UROBILINOGEN, NITRITE, LEUKOCYTESUR in the last 72 hours.  Invalid input(s): APPERANCEUR    Imaging: Ct Chest Wo Contrast  Result Date: 09/04/2016 CLINICAL DATA:  Evaluate for pneumonia.  Sepsis. EXAM: CT CHEST WITHOUT CONTRAST TECHNIQUE: Multidetector CT imaging of the chest was performed following the standard protocol without IV contrast. COMPARISON:  None. FINDINGS: Cardiovascular: Previous median sternotomy and CABG procedure. Moderate cardiac enlargement. Aortic atherosclerosis. No pericardial effusion. Mediastinum/Nodes: No enlarged mediastinal or axillary lymph nodes. Thyroid gland, trachea, and esophagus demonstrate no significant findings. Lungs/Pleura: No pleural fluid. Atelectasis and airspace consolidation involving the left lower lobe is identified. Focal area of ground-glass attenuation in the right upper lobe measures 2.1 cm, image 43 of series 3. Upper Abdomen: No acute abnormality. Bilateral renal cortical atrophy identified. Compatible with known end-stage renal  disease. 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 No chest wall mass or suspicious bone lesions identified. IMPRESSION: 1. Left lower lobe atelectasis and consolidation compatible with suspected pneumonia and or aspiration. 2. Right upper lobe ground-glass density. Initial follow-up with CT at 6-12 months is recommended to confirm persistence. If persistent, repeat CT is recommended every 2 years until 5 years of stability has been established. This recommendation follows the consensus statement: Guidelines for Management of Incidental Pulmonary Nodules Detected on CT Images: From the Fleischner Society 2017; Radiology 2017; 284:228-243. Electronically Signed   By: Kerby Moors M.D.   On: 09/04/2016 20:05   Dg Chest Portable 1 View  Result Date: 09/04/2016 CLINICAL DATA:  Lethargy EXAM: PORTABLE CHEST 1 VIEW COMPARISON:  07/23/2016 FINDINGS:  Right lung is grossly clear. Post sternotomy changes. Probable small left effusion. Dense consolidation at the lingula and left lung base, not significantly changed. Cardiomegaly. No pneumothorax. Vascular stent in the left upper extremity IMPRESSION: 1. Cardiomegaly 2. Dense consolidation in the lingula and left lower lobe with probable small left effusion. Radiographic follow-up to resolution is advised. Electronically Signed   By: Donavan Foil M.D.   On: 09/04/2016 18:55     Medications:    . allopurinol  100 mg Oral Daily  . aspirin EC  81 mg Oral Daily  . atorvastatin  40 mg Oral q1800  . ceFEPime (MAXIPIME) IV  1 g Intravenous Q24H  . clopidogrel  75 mg Oral Q breakfast  . heparin  5,000 Units Subcutaneous Q8H  . insulin aspart  0-5 Units Subcutaneous QHS  . insulin aspart  0-9 Units Subcutaneous TID WC  . levETIRAcetam  1,000 mg Oral Daily  . levETIRAcetam  250 mg Oral Q T,Th,Sa-HD  . pantoprazole  40 mg Oral Daily   ondansetron **OR** ondansetron (ZOFRAN) IV  Assessment/ Plan:  Mr. Nowell Sites is a 66 y.o. 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   TTS [facility]  1. End Stage Renal Disease with hyperkalemia: hemodialysis today. Tolerated treatment well. UF of 1 litre. 2 K bath. Continue TTS schedule.   2. Hypertension: blood pressure at goal. Holding BP medications.   3. Anemia of chronic kidney disease: hemoglobin 11.5. Hold EPO  4. Secondary Hyperparathyroidism: with hyperphosphatemia. Not currently on binders.    LOS: [place], [place] 3/22/20182:34 PM

## 2016-09-05 NOTE — Progress Notes (Signed)
Pre hd assessment  

## 2016-09-05 NOTE — Progress Notes (Signed)
[place] [facility] [place] NAME: [name]    MR#:  [id number]  DATE OF BIRTH:  [birth year]  SUBJECTIVE:   Patient here withMalaise and found to have sepsis due to pneumonia.  REVIEW OF SYSTEMS:    Review of Systems  Constitutional: Negative for chills, fever and malaise/fatigue.  HENT: Negative.  Negative for ear discharge, ear pain, hearing loss, nosebleeds and sore throat.   Eyes: Negative.  Negative for blurred vision and pain.  Respiratory: Negative.  Negative for cough, hemoptysis, shortness of breath and wheezing.   Cardiovascular: Negative.  Negative for chest pain, palpitations and leg swelling.  Gastrointestinal: Positive for nausea. Negative for abdominal pain, blood in stool, diarrhea and vomiting.  Genitourinary: Negative.  Negative for dysuria.  Musculoskeletal: Negative.  Negative for back pain.  Skin: Negative.   Neurological: Positive for weakness. Negative for dizziness, tremors, speech change, focal weakness, seizures and headaches.  Endo/Heme/Allergies: Negative.  Does not bruise/bleed easily.  Psychiatric/Behavioral: Negative.  Negative for depression, hallucinations and suicidal ideas.    Tolerating Diet: yes      DRUG ALLERGIES:   Allergies  Allergen Reactions  . Ivp Dye [Iodinated Diagnostic Agents] Other (See Comments)    Pt denied    VITALS:  Blood pressure 110/72, pulse 96, temperature 98.8 F (37.1 C), temperature source Oral, resp. rate (!) 22, height 4\' 9"  (1.448 m), weight 73 kg (160 lb 15 oz), SpO2 100 %.  PHYSICAL EXAMINATION:   Physical Exam  Constitutional: He is oriented to person, place, and time and well-developed, well-nourished, and in no distress. No distress.  HENT:  Head: Normocephalic.  Eyes: No scleral icterus.  Neck: Normal range of motion. Neck supple. No JVD present. No tracheal deviation present.  Cardiovascular: Normal rate, regular rhythm and normal heart sounds.  Exam reveals  no gallop and no friction rub.   No murmur heard. Pulmonary/Chest: Effort normal and breath sounds normal. No respiratory distress. He has no wheezes. He has no rales. He exhibits no tenderness.  Abdominal: Soft. Bowel sounds are normal. He exhibits no distension and no mass. There is no tenderness. There is no rebound and no guarding.  Musculoskeletal: Normal range of motion. He exhibits no edema.  Neurological: He is alert and oriented to person, place, and time.  Skin: Skin is warm. No rash noted. No erythema.  Psychiatric: Affect and judgment normal.      LABORATORY PANEL:   CBC  Recent Labs Lab 09/05/16 0607  WBC 17.1*  HGB 11.5*  HCT 36.8*  PLT 223   ------------------------------------------------------------------------------------------------------------------  Chemistries   Recent Labs Lab 09/04/16 1834 09/05/16 0607  NA 137 138  K 4.1 6.9*  CL 93* 99*  CO2 26 24  GLUCOSE 210* 142*  BUN 51* 59*  CREATININE 10.77* 11.40*  CALCIUM 8.4* 7.5*  AST 841*  --   ALT 456*  --   ALKPHOS 336*  --   BILITOT 3.3*  --    ------------------------------------------------------------------------------------------------------------------  Cardiac Enzymes  Recent Labs Lab 09/04/16 1834 09/05/16 0104 09/05/16 0607  TROPONINI 0.03* 0.03* 0.03*   ------------------------------------------------------------------------------------------------------------------  RADIOLOGY:  Ct Chest Wo Contrast  Result Date: 09/04/2016 CLINICAL DATA:  Evaluate for pneumonia.  Sepsis. EXAM: CT CHEST WITHOUT CONTRAST TECHNIQUE: Multidetector CT imaging of the chest was performed following the standard protocol without IV contrast. COMPARISON:  None. FINDINGS: Cardiovascular: Previous median sternotomy and CABG procedure. Moderate cardiac enlargement. Aortic atherosclerosis. No pericardial effusion. Mediastinum/Nodes: No enlarged mediastinal or axillary  lymph nodes. Thyroid gland, trachea,  and esophagus demonstrate no significant findings. Lungs/Pleura: No pleural fluid. Atelectasis and airspace consolidation involving the left lower lobe is identified. Focal area of ground-glass attenuation in the right upper lobe measures 2.1 cm, image 43 of series 3. Upper Abdomen: No acute abnormality. Bilateral renal cortical atrophy identified. Compatible with known end-stage renal disease. 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 No chest wall mass or suspicious bone lesions identified. IMPRESSION: 1. Left lower lobe atelectasis and consolidation compatible with suspected pneumonia and or aspiration. 2. Right upper lobe ground-glass density. Initial follow-up with CT at 6-12 months is recommended to confirm persistence. If persistent, repeat CT is recommended every 2 years until 5 years of stability has been established. This recommendation follows the consensus statement: Guidelines for Management of Incidental Pulmonary Nodules Detected on CT Images: From the Fleischner Society 2017; Radiology 2017; 284:228-243. Electronically Signed   By: Kerby Moors M.D.   On: 09/04/2016 20:05   Dg Chest Portable 1 View  Result Date: 09/04/2016 CLINICAL DATA:  Lethargy EXAM: PORTABLE CHEST 1 VIEW COMPARISON:  07/23/2016 FINDINGS: Right lung is grossly clear. Post sternotomy changes. Probable small left effusion. Dense consolidation at the lingula and left lung base, not significantly changed. Cardiomegaly. No pneumothorax. Vascular stent in the left upper extremity IMPRESSION: 1. Cardiomegaly 2. Dense consolidation in the lingula and left lower lobe with probable small left effusion. Radiographic follow-up to resolution is advised. Electronically Signed   By: Donavan Foil M.D.   On: 09/04/2016 18:55     ASSESSMENT AND PLAN:   66 year old male with ESRD on hemodialysis, diabetes and essential hypertension who presents with sepsis.  1. Sepsis present on  admission: Patient presented with tachycardia, tachypnea and leukocytosis Sepsis from pneumonia Continue cefepime for pneumonia Follow up on blood cultures MRSA PCR is negative Follow lactic acid  2. ESRD on hemodialysis with hyperkalemia patient underwent dialysis today 3. Hyperkalemia: Treated with insulin and dextrose and hemodialysis   4. Diabetes: Continue sliding scale insulin 5. Chronic diastolic heart failure plan no evidence of exacerbation    Management plans discussed with the patient and she is in agreement.  CODE STATUS: full  TOTAL TIME TAKING CARE OF THIS PATIENT: 30 minutes.     POSSIBLE D/C 2-3 days, DEPENDING ON CLINICAL CONDITION.   Vicktoria Muckey M.D on 09/05/2016 at 1:49 PM  Between 7am to 6pm - Pager - [phone] After 6pm go to [email or web address] - password [email or web address] [facility]  Office  [phone]  CC: Primary care physician; [name], MD  Note: This dictation was prepared with Dragon dictation along with smaller phrase technology. Any transcriptional errors that result from this process are unintentional.

## 2016-09-05 NOTE — Progress Notes (Signed)
Post hd assessment 

## 2016-09-05 NOTE — Care Management (Signed)
Is followed by [facility] [place] [facility].  Notified Elvera Bicker with [facility] of admission

## 2016-09-05 NOTE — Progress Notes (Signed)
Start of hd 

## 2016-09-06 ENCOUNTER — Inpatient Hospital Stay: Payer: Medicare Other

## 2016-09-06 LAB — COMPREHENSIVE METABOLIC PANEL
ALK PHOS: 257 U/L — AB (ref 38–126)
ALT: 208 U/L — AB (ref 17–63)
AST: 160 U/L — AB (ref 15–41)
Albumin: 3.2 g/dL — ABNORMAL LOW (ref 3.5–5.0)
Anion gap: 13 (ref 5–15)
BUN: 28 mg/dL — ABNORMAL HIGH (ref 6–20)
CALCIUM: 8.1 mg/dL — AB (ref 8.9–10.3)
CO2: 29 mmol/L (ref 22–32)
Chloride: 98 mmol/L — ABNORMAL LOW (ref 101–111)
Creatinine, Ser: 7.37 mg/dL — ABNORMAL HIGH (ref 0.61–1.24)
GFR calc Af Amer: 8 mL/min — ABNORMAL LOW (ref >60)
GFR calc non Af Amer: 7 mL/min — ABNORMAL LOW (ref >60)
Glucose, Bld: 114 mg/dL — ABNORMAL HIGH (ref 65–99)
Potassium: 4.8 mmol/L (ref 3.5–5.1)
Sodium: 140 mmol/L (ref 135–145)
Total Bilirubin: 3.1 mg/dL — ABNORMAL HIGH (ref 0.3–1.2)
Total Protein: 7.1 g/dL (ref 6.5–8.1)

## 2016-09-06 LAB — GLUCOSE, CAPILLARY
GLUCOSE-CAPILLARY: 131 mg/dL — AB (ref 65–99)
GLUCOSE-CAPILLARY: 156 mg/dL — AB (ref 65–99)
Glucose-Capillary: 105 mg/dL — ABNORMAL HIGH (ref 65–99)
Glucose-Capillary: 199 mg/dL — ABNORMAL HIGH (ref 65–99)

## 2016-09-06 LAB — TROPONIN I: Troponin I: 0.03 ng/mL (ref <0.03)

## 2016-09-06 LAB — LACTIC ACID, PLASMA: Lactic Acid, Venous: 0.9 mmol/L (ref 0.5–1.9)

## 2016-09-06 LAB — VANCOMYCIN, RANDOM: VANCOMYCIN RM: 18

## 2016-09-06 LAB — HEPATITIS B SURFACE ANTIGEN: HEP B S AG: NEGATIVE

## 2016-09-06 MED ORDER — MEROPENEM-SODIUM CHLORIDE 500 MG/50ML IV SOLR
500.0000 mg | INTRAVENOUS | Status: DC
  Administered 2016-09-06 – 2016-09-08 (×3): 500 mg via INTRAVENOUS
  Filled 2016-09-06 (×4): qty 50

## 2016-09-06 NOTE — Consult Note (Signed)
Pharmacy Antibiotic Note  [name] is a 66 y.o. male admitted on 09/04/2016 with bacteremia.  Pharmacy has been consulted for meropenm dosing. Pt is a HD patient TTSat.  Plan: meropenem 500mg  q 24 hours- to be given AFTER HD on HD days  Height: 4\' 9"  (144.8 cm) Weight: 155 lb 6.8 oz (70.5 kg) IBW/kg (Calculated) : 43.1  Temp (24hrs), Avg:98.1 F (36.7 C), Min:98 F (36.7 C), Max:98.3 F (36.8 C)   Recent Labs Lab 09/04/16 1834 09/04/16 2101 09/05/16 0104 09/05/16 0607 09/06/16 0509 09/06/16 1152  WBC 10.0  --   --  17.1*  --   --   CREATININE 10.77*  --   --  11.40* 7.37*  --   LATICACIDVEN 4.5* 5.0* 5.7*  --  0.9  --   VANCORANDOM  --   --   --   --   --  18    Estimated Creatinine Clearance: 7.6 mL/min (A) (by C-G formula based on SCr of 7.37 mg/dL (H)).    Allergies  Allergen Reactions  . Ivp Dye [Iodinated Diagnostic Agents] Other (See Comments)    Pt denied    Antimicrobials this admission: Vancomycin 3/21 >>> 3/21  Cefepime 3/21 >>> 3/23 Meropenem 3/23>>  Dose adjustments this admission:   Microbiology results: 3/21 BCx: 2/2 ecoli/GNR 3/21 MRSA PCR: neg  Thank you for allowing pharmacy to be a part of this patient's care.  Ramond Dial, Pharm.D, BCPS Clinical Pharmacist  09/06/2016 4:29 PM

## 2016-09-06 NOTE — Progress Notes (Signed)
[facility] NAME: [name]    MR#:  [id number]  DATE OF BIRTH:  [birth year]  SUBJECTIVE:   Patient denies abdominal pain doing better feels stronger this am  REVIEW OF SYSTEMS:    Review of Systems  Constitutional: Negative for chills, fever and malaise/fatigue.  HENT: Negative.  Negative for ear discharge, ear pain, hearing loss, nosebleeds and sore throat.   Eyes: Negative.  Negative for blurred vision and pain.  Respiratory: Negative.  Negative for cough, hemoptysis, shortness of breath and wheezing.   Cardiovascular: Negative.  Negative for chest pain, palpitations and leg swelling.  Gastrointestinal: Negative for abdominal pain, blood in stool, diarrhea, nausea and vomiting.  Genitourinary: Negative.  Negative for dysuria.  Musculoskeletal: Negative.  Negative for back pain.  Skin: Negative.   Neurological: Positive for weakness (better). Negative for dizziness, tremors, speech change, focal weakness, seizures and headaches.  Endo/Heme/Allergies: Negative.  Does not bruise/bleed easily.  Psychiatric/Behavioral: Negative.  Negative for depression, hallucinations and suicidal ideas.    Tolerating Diet: yes      DRUG ALLERGIES:   Allergies  Allergen Reactions  . Ivp Dye [Iodinated Diagnostic Agents] Other (See Comments)    Pt denied    VITALS:  Blood pressure (!) 111/58, pulse 78, temperature 98.3 F (36.8 C), temperature source Oral, resp. rate 18, height 4\' 9"  (1.448 m), weight 70.5 kg (155 lb 6.8 oz), SpO2 100 %.  PHYSICAL EXAMINATION:   Physical Exam  Constitutional: He is oriented to person, place, and time and well-developed, well-nourished, and in no distress. No distress.  HENT:  Head: Normocephalic.  Eyes: No scleral icterus.  Neck: Normal range of motion. Neck supple. No JVD present. No tracheal deviation present.  Cardiovascular: Normal rate, regular rhythm and normal heart sounds.  Exam reveals no  gallop and no friction rub.   No murmur heard. Pulmonary/Chest: Effort normal and breath sounds normal. No respiratory distress. He has no wheezes. He has no rales. He exhibits no tenderness.  Abdominal: Soft. Bowel sounds are normal. He exhibits no distension and no mass. There is no tenderness. There is no rebound and no guarding.  Musculoskeletal: Normal range of motion. He exhibits no edema.  Neurological: He is alert and oriented to person, place, and time.  Skin: Skin is warm. No rash noted. No erythema.  Psychiatric: Affect and judgment normal.      LABORATORY PANEL:   CBC  Recent Labs Lab 09/05/16 0607  WBC 17.1*  HGB 11.5*  HCT 36.8*  PLT 223   ------------------------------------------------------------------------------------------------------------------  Chemistries   Recent Labs Lab 09/06/16 0509  NA 140  K 4.8  CL 98*  CO2 29  GLUCOSE 114*  BUN 28*  CREATININE 7.37*  CALCIUM 8.1*  AST 160*  ALT 208*  ALKPHOS 257*  BILITOT 3.1*   ------------------------------------------------------------------------------------------------------------------  Cardiac Enzymes  Recent Labs Lab 09/04/16 1834 09/05/16 0104 09/05/16 0607  TROPONINI 0.03* 0.03* 0.03*   ------------------------------------------------------------------------------------------------------------------  RADIOLOGY:  Ct Chest Wo Contrast  Result Date: 09/04/2016 CLINICAL DATA:  Evaluate for pneumonia.  Sepsis. EXAM: CT CHEST WITHOUT CONTRAST TECHNIQUE: Multidetector CT imaging of the chest was performed following the standard protocol without IV contrast. COMPARISON:  None. FINDINGS: Cardiovascular: Previous median sternotomy and CABG procedure. Moderate cardiac enlargement. Aortic atherosclerosis. No pericardial effusion. Mediastinum/Nodes: No enlarged mediastinal or axillary lymph nodes. Thyroid gland, trachea, and esophagus demonstrate no significant findings. Lungs/Pleura: No pleural  fluid. Atelectasis and airspace consolidation involving the left lower  lobe is identified. Focal area of ground-glass attenuation in the right upper lobe measures 2.1 cm, image 43 of series 3. Upper Abdomen: No acute abnormality. Bilateral renal cortical atrophy identified. Compatible with known end-stage renal disease. 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 No chest wall mass or suspicious bone lesions identified. IMPRESSION: 1. Left lower lobe atelectasis and consolidation compatible with suspected pneumonia and or aspiration. 2. Right upper lobe ground-glass density. Initial follow-up with CT at 6-12 months is recommended to confirm persistence. If persistent, repeat CT is recommended every 2 years until 5 years of stability has been established. This recommendation follows the consensus statement: Guidelines for Management of Incidental Pulmonary Nodules Detected on CT Images: From the Fleischner Society 2017; Radiology 2017; 284:228-243. Electronically Signed   By: Kerby Moors M.D.   On: 09/04/2016 20:05   Dg Chest Portable 1 View  Result Date: 09/04/2016 CLINICAL DATA:  Lethargy EXAM: PORTABLE CHEST 1 VIEW COMPARISON:  07/23/2016 FINDINGS: Right lung is grossly clear. Post sternotomy changes. Probable small left effusion. Dense consolidation at the lingula and left lung base, not significantly changed. Cardiomegaly. No pneumothorax. Vascular stent in the left upper extremity IMPRESSION: 1. Cardiomegaly 2. Dense consolidation in the lingula and left lower lobe with probable small left effusion. Radiographic follow-up to resolution is advised. Electronically Signed   By: Donavan Foil M.D.   On: 09/04/2016 18:55   US Abdomen Limited Ruq  Result Date: 09/06/2016 CLINICAL DATA:  Elevated liver function tests. EXAM: US ABDOMEN LIMITED - RIGHT UPPER QUADRANT COMPARISON:  None. FINDINGS: Gallbladder: Gallbladder wall is upper normal in thickness at 3 mm.  No pericholecystic fluid, sonographic Murphy's sign or other secondary signs of acute cholecystitis. No gallstones seen. Common bile duct: Diameter: Common bile duct mildly distended distally measuring 8 mm diameter. CBD measures 7 mm at the porta hepatis. No bile duct stone demonstrated. Liver: Liver parenchyma is at least mildly echogenic throughout suggesting fatty infiltration. No focal liver abnormality identified. Incidental note made of a diffusely echogenic kidney, small in size, likely indicating chronic medical renal disease. IMPRESSION: 1. Mildly distended common bile duct, measuring 8 mm distally. Given the elevated liver function tests, consider MRCP for more definitive characterization. 2. Probable fatty infiltration of the liver. 3. No gallstones.  No evidence of cholecystitis. 4. Small echogenic right kidney, incompletely evaluated, suggesting chronic medical renal disease. Electronically Signed   By: Franki Cabot M.D.   On: 09/06/2016 09:09     ASSESSMENT AND PLAN:   66 year old male with ESRD on hemodialysis, diabetes and essential hypertension who presents with sepsis.  1. Escherichia coli Sepsis present on admission: Patient presented with tachycardia, tachypnea and leukocytosis Sepsis from pneumonia and Escherichia coli bacteremia Continue cefepime for pneumonia and bacteremia Follow up on final blood cultures MRSA PCR is negative Lactic acidosis has resolved Consult ID  2. Elevated LFTs with chronic elevation in LFTs: LFTs are improving Probably sepsis related order MRCP as indicated by right upper ultrasound Hepatitis B surface antigen is negative 3. ESRD on hemodialysis: Continue dialysis as per nephrology   4. Hyperkalemia: Treated with insulin and dextrose and hemodialysis This is resolved   4. Diabetes: Continue sliding scale insulin 5. Chronic diastolic heart failure : no evidence of exacerbation    Management plans discussed with the patient and he is in  agreement.  CODE STATUS: full  TOTAL TIME TAKING CARE OF THIS PATIENT: 27 minutes.     POSSIBLE D/C 2-3 days, DEPENDING ON CLINICAL CONDITION.  Ramon Brant M.D on 09/06/2016 at 10:07 AM  Between 7am to 6pm - Pager - [phone] After 6pm go to [email or web address] - password [email or web address] [facility]  Office  [phone]  CC: Primary care physician; [name], MD  Note: This dictation was prepared with Dragon dictation along with smaller phrase technology. Any transcriptional errors that result from this process are unintentional.

## 2016-09-06 NOTE — Progress Notes (Signed)
[facility]  ROUNDING NOTE   Subjective:   Hemodialysis yesterday. Tolerated treatment well. UF of 1 litre.   MRCP scheduled.  Wife at bedside. Patient with no complaints.   Objective:  Vital signs in last 24 hours:  Temp:  [98 F (36.7 C)-98.3 F (36.8 C)] 98.1 F (36.7 C) (03/23 1400) Pulse Rate:  [78-82] 79 (03/23 1400) Resp:  [18-20] 18 (03/23 1400) BP: (111-140)/(58-74) 140/74 (03/23 1400) SpO2:  [98 %-100 %] 98 % (03/23 1400) Weight:  [70.5 kg (155 lb 6.8 oz)] 70.5 kg (155 lb 6.8 oz) (03/23 0500)  Weight change: 0.826 kg (1 lb 13.1 oz) Filed Weights   09/05/16 1020 09/05/16 1325 09/06/16 0500  Weight: 73.9 kg (162 lb 14.7 oz) 73 kg (160 lb 15 oz) 70.5 kg (155 lb 6.8 oz)    Intake/Output: I/O last 3 completed shifts: In: 120 [P.O.:120] Out: 1000 [Other:1000]   Intake/Output this shift:  No intake/output data recorded.  Physical Exam: General: NAD,   Head: Normocephalic, atraumatic. Moist oral mucosal membranes  Eyes: Anicteric, PERRL  Neck: Supple, trachea midline  Lungs:  Clear to auscultation  Heart: Regular rate and rhythm  Abdomen:  Soft, nontender,   Extremities: no peripheral edema.  Neurologic: Nonfocal, moving all four extremities  Skin: No lesions  Access: Left AVF    Basic Metabolic Panel:  Recent Labs Lab 09/04/16 1834 09/05/16 0607 09/06/16 0509  NA 137 138 140  K 4.1 6.9* 4.8  CL 93* 99* 98*  CO2 26 24 29   GLUCOSE 210* 142* 114*  BUN 51* 59* 28*  CREATININE 10.77* 11.40* 7.37*  CALCIUM 8.4* 7.5* 8.1*    Liver Function Tests:  Recent Labs Lab 09/04/16 1834 09/06/16 0509  AST 841* 160*  ALT 456* 208*  ALKPHOS 336* 257*  BILITOT 3.3* 3.1*  PROT 8.9* 7.1  ALBUMIN 4.1 3.2*    Recent Labs Lab 09/04/16 1834  LIPASE 50    Recent Labs Lab 09/04/16 1834  AMMONIA 34    CBC:  Recent Labs Lab 09/04/16 1834 09/05/16 0607  WBC 10.0 17.1*  NEUTROABS 8.9*  --   HGB 13.4 11.5*  HCT 42.9 36.8*  MCV  74.7* 75.1*  PLT 241 223    Cardiac Enzymes:  Recent Labs Lab 09/04/16 1834 09/05/16 0104 09/05/16 0607  TROPONINI 0.03* 0.03* 0.03*    BNP: Invalid input(s): POCBNP  CBG:  Recent Labs Lab 09/05/16 1408 09/05/16 1657 09/05/16 2145 09/06/16 0745 09/06/16 1158  GLUCAP 87 159* 128* 105* 199*    Microbiology: Results for orders placed or performed during the hospital encounter of 09/04/16  Blood culture (routine x 2)     Status: None (Preliminary result)   Collection Time: 09/04/16  9:01 PM  Result Value Ref Range Status   Specimen Description BLOOD RIGHT FOREARM  Final   Special Requests BOTTLES DRAWN AEROBIC AND ANAEROBIC  BCHV  Final   Culture  Setup Time   Final    GRAM NEGATIVE RODS IN BOTH AEROBIC AND ANAEROBIC BOTTLES CRITICAL RESULT CALLED TO, READ BACK BY AND VERIFIED WITH: HANK ZOMPA 09/05/16 1128 SGD    Culture GRAM NEGATIVE RODS  Final   Report Status PENDING  Incomplete  Blood culture (routine x 2)     Status: Abnormal (Preliminary result)   Collection Time: 09/04/16  9:01 PM  Result Value Ref Range Status   Specimen Description BLOOD RIGHT ANTECUBITAL  Final   Special Requests BOTTLES DRAWN AEROBIC AND ANAEROBIC  [place]  Final  Culture  Setup Time   Final    Organism ID to follow GRAM NEGATIVE RODS IN BOTH AEROBIC AND ANAEROBIC BOTTLES CRITICAL RESULT CALLED TO, READ BACK BY AND VERIFIED WITH: HANK ZOMPA 09/05/16 1128 SGD    Culture (A)  Final    ESCHERICHIA COLI SUSCEPTIBILITIES TO FOLLOW Performed at [facility], [place] [address], [place], [place] [address]    Report Status PENDING  Incomplete  Blood Culture ID Panel (Reflexed)     Status: Abnormal   Collection Time: 09/04/16  9:01 PM  Result Value Ref Range Status   Enterococcus species NOT DETECTED NOT DETECTED Final   Listeria monocytogenes NOT DETECTED NOT DETECTED Final   Staphylococcus species NOT DETECTED NOT DETECTED Final   Staphylococcus aureus NOT DETECTED NOT DETECTED Final    Streptococcus species NOT DETECTED NOT DETECTED Final   Streptococcus agalactiae NOT DETECTED NOT DETECTED Final   Streptococcus pneumoniae NOT DETECTED NOT DETECTED Final   Streptococcus pyogenes NOT DETECTED NOT DETECTED Final   Acinetobacter baumannii NOT DETECTED NOT DETECTED Final   Enterobacteriaceae species DETECTED (A) NOT DETECTED Final    Comment: Enterobacteriaceae represent a large family of gram-negative bacteria, not a single organism. CRITICAL RESULT CALLED TO, READ BACK BY AND VERIFIED WITH: HANK ZOMPA 09/05/16 1128 SGD    Enterobacter cloacae complex NOT DETECTED NOT DETECTED Final   Escherichia coli DETECTED (A) NOT DETECTED Final    Comment: CRITICAL RESULT CALLED TO, READ BACK BY AND VERIFIED WITH: HANK ZOMPA 09/05/16 1128 SGD    Klebsiella oxytoca NOT DETECTED NOT DETECTED Final   Klebsiella pneumoniae NOT DETECTED NOT DETECTED Final   Proteus species NOT DETECTED NOT DETECTED Final   Serratia marcescens NOT DETECTED NOT DETECTED Final   Carbapenem resistance NOT DETECTED NOT DETECTED Final   Haemophilus influenzae NOT DETECTED NOT DETECTED Final   Neisseria meningitidis NOT DETECTED NOT DETECTED Final   Pseudomonas aeruginosa NOT DETECTED NOT DETECTED Final   Candida albicans NOT DETECTED NOT DETECTED Final   Candida glabrata NOT DETECTED NOT DETECTED Final   Candida krusei NOT DETECTED NOT DETECTED Final   Candida parapsilosis NOT DETECTED NOT DETECTED Final   Candida tropicalis NOT DETECTED NOT DETECTED Final  MRSA PCR Screening     Status: None   Collection Time: 09/04/16 11:14 PM  Result Value Ref Range Status   MRSA by PCR NEGATIVE NEGATIVE Final    Comment:        The GeneXpert MRSA Assay (FDA approved for NASAL specimens only), is one component of a comprehensive MRSA colonization surveillance program. It is not intended to diagnose MRSA infection nor to guide or monitor treatment for MRSA infections.     Coagulation Studies:  Recent Labs   09/04/16 14-Sep-2099  LABPROT 13.6  INR 1.04    Urinalysis: No results for input(s): COLORURINE, LABSPEC, PHURINE, GLUCOSEU, HGBUR, BILIRUBINUR, KETONESUR, PROTEINUR, UROBILINOGEN, NITRITE, LEUKOCYTESUR in the last 72 hours.  Invalid input(s): APPERANCEUR    Imaging: Ct Chest Wo Contrast  Result Date: 09/04/2016 CLINICAL DATA:  Evaluate for pneumonia.  Sepsis. EXAM: CT CHEST WITHOUT CONTRAST TECHNIQUE: Multidetector CT imaging of the chest was performed following the standard protocol without IV contrast. COMPARISON:  None. FINDINGS: Cardiovascular: Previous median sternotomy and CABG procedure. Moderate cardiac enlargement. Aortic atherosclerosis. No pericardial effusion. Mediastinum/Nodes: No enlarged mediastinal or axillary lymph nodes. Thyroid gland, trachea, and esophagus demonstrate no significant findings. Lungs/Pleura: No pleural fluid. Atelectasis and airspace consolidation involving the left lower lobe is identified. Focal area of  ground-glass attenuation in the right upper lobe measures 2.1 cm, image 43 of series 3. Upper Abdomen: No acute abnormality. Bilateral renal cortical atrophy identified. Compatible with known end-stage renal disease. 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 No chest wall mass or suspicious bone lesions identified. IMPRESSION: 1. Left lower lobe atelectasis and consolidation compatible with suspected pneumonia and or aspiration. 2. Right upper lobe ground-glass density. Initial follow-up with CT at 6-12 months is recommended to confirm persistence. If persistent, repeat CT is recommended every 2 years until 5 years of stability has been established. This recommendation follows the consensus statement: Guidelines for Management of Incidental Pulmonary Nodules Detected on CT Images: From the Fleischner Society 2017; Radiology 2017; 284:228-243. Electronically Signed   By: Kerby Moors M.D.   On: 09/04/2016 20:05   Dg  Chest Portable 1 View  Result Date: 09/04/2016 CLINICAL DATA:  Lethargy EXAM: PORTABLE CHEST 1 VIEW COMPARISON:  07/23/2016 FINDINGS: Right lung is grossly clear. Post sternotomy changes. Probable small left effusion. Dense consolidation at the lingula and left lung base, not significantly changed. Cardiomegaly. No pneumothorax. Vascular stent in the left upper extremity IMPRESSION: 1. Cardiomegaly 2. Dense consolidation in the lingula and left lower lobe with probable small left effusion. Radiographic follow-up to resolution is advised. Electronically Signed   By: Donavan Foil M.D.   On: 09/04/2016 18:55   US Abdomen Limited Ruq  Result Date: 09/06/2016 CLINICAL DATA:  Elevated liver function tests. EXAM: US ABDOMEN LIMITED - RIGHT UPPER QUADRANT COMPARISON:  None. FINDINGS: Gallbladder: Gallbladder wall is upper normal in thickness at 3 mm. No pericholecystic fluid, sonographic Murphy's sign or other secondary signs of acute cholecystitis. No gallstones seen. Common bile duct: Diameter: Common bile duct mildly distended distally measuring 8 mm diameter. CBD measures 7 mm at the porta hepatis. No bile duct stone demonstrated. Liver: Liver parenchyma is at least mildly echogenic throughout suggesting fatty infiltration. No focal liver abnormality identified. Incidental note made of a diffusely echogenic kidney, small in size, likely indicating chronic medical renal disease. IMPRESSION: 1. Mildly distended common bile duct, measuring 8 mm distally. Given the elevated liver function tests, consider MRCP for more definitive characterization. 2. Probable fatty infiltration of the liver. 3. No gallstones.  No evidence of cholecystitis. 4. Small echogenic right kidney, incompletely evaluated, suggesting chronic medical renal disease. Electronically Signed   By: Franki Cabot M.D.   On: 09/06/2016 09:09     Medications:    . allopurinol  100 mg Oral Daily  . aspirin EC  81 mg Oral Daily  . atorvastatin  40  mg Oral q1800  . ceFEPime (MAXIPIME) IV  1 g Intravenous Q24H  . clopidogrel  75 mg Oral Q breakfast  . heparin  5,000 Units Subcutaneous Q8H  . insulin aspart  0-5 Units Subcutaneous QHS  . insulin aspart  0-9 Units Subcutaneous TID WC  . levETIRAcetam  1,000 mg Oral Daily  . levETIRAcetam  250 mg Oral Q T,Th,Sa-HD  . pantoprazole  40 mg Oral Daily   ondansetron **OR** ondansetron (ZOFRAN) IV  Assessment/ Plan:  [name] is a 66 y.o. 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   TTS [facility]  1. End Stage Renal Disease with hyperkalemia: TTS schedule. Dialysis for tomorrow.   2. Hypertension: blood pressure at goal. Holding BP medications.   3.  Anemia of chronic kidney disease: hemoglobin 11.5. Hold EPO  4. Secondary Hyperparathyroidism: with hyperphosphatemia. Not currently on binders.    LOS: [place], [name] 3/23/20182:24 PM

## 2016-09-06 NOTE — Progress Notes (Signed)
Pt diet changed to NPO sips with meds only for abdominal ultrasound in am per ultrasound department.

## 2016-09-06 NOTE — Progress Notes (Signed)
Pharmacy Antibiotic Note  [name] is a 66 y.o. male admitted on 09/04/2016 with pneumonia.  Pharmacy has been consulted for Cefepime dosing. Patient also being treated for Ecoli bacteremia   Plan: Continue Cefepime 1 gram q 24 hours.  Height: 4\' 9"  (144.8 cm) Weight: 155 lb 6.8 oz (70.5 kg) IBW/kg (Calculated) : 43.1  Temp (24hrs), Avg:98.2 F (36.8 C), Min:97.5 F (36.4 C), Max:98.8 F (37.1 C)   Recent Labs Lab 09/04/16 1834 09/04/16 2101 09/05/16 0104 09/05/16 0607 09/06/16 0509 09/06/16 1152  WBC 10.0  --   --  17.1*  --   --   CREATININE 10.77*  --   --  11.40* 7.37*  --   LATICACIDVEN 4.5* 5.0* 5.7*  --  0.9  --   VANCORANDOM  --   --   --   --   --  18    Estimated Creatinine Clearance: 7.6 mL/min (A) (by C-G formula based on SCr of 7.37 mg/dL (H)).    Allergies  Allergen Reactions  . Ivp Dye [Iodinated Diagnostic Agents] Other (See Comments)    Pt denied    Antimicrobials this admission:   Dose adjustments this admission:   Microbiology results: 3/21 BCx: Ecoli 3/21 MRSA PCR: negative  [name] 09/06/2016 1:16 PM

## 2016-09-06 NOTE — Progress Notes (Signed)
Patient NPO after breakfast for ERCP this afternoon.

## 2016-09-06 NOTE — Care Management Important Message (Signed)
Important Message  Patient Details  Name: [name] MRN: [id number] Date of Birth: 1951/03/09   Medicare Important Message Given:  Yes    Katrina Stack, RN 09/06/2016, 8:49 AM

## 2016-09-06 NOTE — Consult Note (Signed)
[facility] Infectious Disease     Reason for Consult: E coli bacteremia    Referring Physician: Adrian Saran Date of Admission:  09/04/2016   Principal Problem:   Sepsis (HCC) Active Problems:   ESRD on dialysis (HCC)   Diabetes (HCC)   HTN (hypertension)   GERD (gastroesophageal reflux disease)   Chronic diastolic CHF (congestive heart failure) (HCC)   Transaminitis   CAP (community acquired pneumonia)   HPI: [name] is a 66 y.o. male admitted with nausea, malaise and chills. Found to have fever, elevated lactate, leukocytosis, elevated LFTs and possible PNA on CT.  BCX + E coli.  He has hx of ESRD s/p renal transplant, CAD, HTN, DM, and Gout.  CT chest showed infiltrate/atelectasis LLL. MRCP shows choledocholithiasis.   Currently on vanco and cefepime. LFTs trending down. Feels better, denies abd pain.   Past Medical History:  Diagnosis Date  . Chronic diastolic congestive heart failure (HCC)   . Chronic disease anemia   . ESRD (end stage renal disease) on dialysis (HCC)    "[place]; [name]" (09/29/2014)  . GERD (gastroesophageal reflux disease)   . Gout   . High cholesterol   . History of blood transfusion    "related to anemia"  . History of stomach ulcers   . Hypertension   . Type II diabetes mellitus (HCC)    Past Surgical History:  Procedure Laterality Date  . ARTERIOVENOUS GRAFT PLACEMENT Left ~ 1996  . CARDIAC CATHETERIZATION  09/29/2014   "[place]"  . CARDIAC CATHETERIZATION N/A 02/26/2016   Procedure: Left Heart Cath and Coronary Angiography Possible PCI;  Surgeon: Alwyn Pea, MD;  Location: [facility] INVASIVE CV LAB;  Service: Cardiovascular;  Laterality: N/A;  . KIDNEY TRANSPLANT Right 2004  . NEPHRECTOMY TRANSPLANTED ORGAN  2015  . PERCUTANEOUS CORONARY STENT INTERVENTION (PCI-S) N/A 09/30/2014   Procedure: PERCUTANEOUS CORONARY STENT INTERVENTION (PCI-S);  Surgeon: Rinaldo Cloud, MD;  Location: [facility] CATH LAB;  Service: Cardiovascular;   Laterality: N/A;  . PERITONEAL CATHETER INSERTION  02/2014  . PERITONEAL CATHETER REMOVAL  08/2014  . THROMBECTOMY / ARTERIOVENOUS GRAFT REVISION  2015   Social History  Substance Use Topics  . Smoking status: Former Smoker    Types: Cigarettes  . Smokeless tobacco: Never Used     Comment: "quit smoking cigarettes in the 1980's"  . Alcohol use No   Family History  Problem Relation Age of Onset  . Hypertension Mother   . Stroke Mother   . Hypertension Father   . Diabetes Mellitus II Father   . Asthma Father     Allergies:  Allergies  Allergen Reactions  . Ivp Dye [Iodinated Diagnostic Agents] Other (See Comments)    Pt denied    Current antibiotics: Antibiotics Given (last 72 hours)    Date/Time Action Medication Dose Rate   09/04/16 2324 Given   vancomycin (VANCOCIN) 1,500 mg in sodium chloride 0.9 % 500 mL IVPB 1,500 mg 250 mL/hr   09/05/16 0213 Given   ceFEPIme (MAXIPIME) 1 g in dextrose 5 % 50 mL IVPB 1 g 100 mL/hr   09/05/16 1707 Given   ceFEPIme (MAXIPIME) 1 g in dextrose 5 % 50 mL IVPB 1 g 100 mL/hr      MEDICATIONS: . allopurinol  100 mg Oral Daily  . aspirin EC  81 mg Oral Daily  . atorvastatin  40 mg Oral q1800  . ceFEPime (MAXIPIME) IV  1 g Intravenous Q24H  . clopidogrel  75 mg Oral Q  breakfast  . heparin  5,000 Units Subcutaneous Q8H  . insulin aspart  0-5 Units Subcutaneous QHS  . insulin aspart  0-9 Units Subcutaneous TID WC  . levETIRAcetam  1,000 mg Oral Daily  . levETIRAcetam  250 mg Oral Q T,Th,Sa-HD  . pantoprazole  40 mg Oral Daily    Review of Systems - 11 systems reviewed and negative per HPI   OBJECTIVE: Temp:  [98 F (36.7 C)-98.3 F (36.8 C)] 98.1 F (36.7 C) (03/23 1400) Pulse Rate:  [78-82] 79 (03/23 1400) Resp:  [18-20] 18 (03/23 1400) BP: (111-140)/(58-74) 140/74 (03/23 1400) SpO2:  [98 %-100 %] 98 % (03/23 1400) Weight:  [70.5 kg (155 lb 6.8 oz)] 70.5 kg (155 lb 6.8 oz) (03/23 0500) Physical Exam  Constitutional: He is  oriented to person, place, and time. He appears well-developed and well-nourished. No distress.  HENT: anicteric Mouth/Throat: Oropharynx is clear and dry . No oropharyngeal exudate.  Cardiovascular: Normal rate, regular rhythm and normal heart sounds. Pulmonary/Chest: Effort normal and breath sounds normal. No respiratory distress. He has no wheezes.  Abdominal: Soft. Bowel sounds are normal. He exhibits no distension. There is no tenderness.  Lymphadenopathy:  He has no cervical adenopathy.  Neurological: He is alert and oriented to person, place, and time.  Skin: Skin is warm and dry. No rash noted. No erythema.  Psychiatric: He has a normal mood and affect. His behavior is normal.     LABS: Results for orders placed or performed during the hospital encounter of 09/04/16 (from the past 48 hour(s))  CBC with Differential/Platelet     Status: Abnormal   Collection Time: 09/04/16  6:34 PM  Result Value Ref Range   WBC 10.0 3.8 - 10.6 K/uL   RBC 5.74 4.40 - 5.90 MIL/uL   Hemoglobin 13.4 13.0 - 18.0 g/dL   HCT 42.9 40.0 - 52.0 %   MCV 74.7 (L) 80.0 - 100.0 fL   MCH 23.4 (L) 26.0 - 34.0 pg   MCHC 31.2 (L) 32.0 - 36.0 g/dL   RDW 19.2 (H) 11.5 - 14.5 %   Platelets 241 150 - 440 K/uL   Neutrophils Relative % 89 %   Neutro Abs 8.9 (H) 1.4 - 6.5 K/uL   Lymphocytes Relative 4 %   Lymphs Abs 0.4 (L) 1.0 - 3.6 K/uL   Monocytes Relative 7 %   Monocytes Absolute 0.7 0.2 - 1.0 K/uL   Eosinophils Relative 0 %   Eosinophils Absolute 0.0 0 - 0.7 K/uL   Basophils Relative 0 %   Basophils Absolute 0.0 0 - 0.1 K/uL  Comprehensive metabolic panel     Status: Abnormal   Collection Time: 09/04/16  6:34 PM  Result Value Ref Range   Sodium 137 135 - 145 mmol/L   Potassium 4.1 3.5 - 5.1 mmol/L   Chloride 93 (L) 101 - 111 mmol/L   CO2 26 22 - 32 mmol/L   Glucose, Bld 210 (H) 65 - 99 mg/dL   BUN 51 (H) 6 - 20 mg/dL   Creatinine, Ser 10.77 (H) 0.61 - 1.24 mg/dL   Calcium 8.4 (L) 8.9 - 10.3 mg/dL    Total Protein 8.9 (H) 6.5 - 8.1 g/dL   Albumin 4.1 3.5 - 5.0 g/dL   AST 841 (H) 15 - 41 U/L   ALT 456 (H) 17 - 63 U/L   Alkaline Phosphatase 336 (H) 38 - 126 U/L   Total Bilirubin 3.3 (H) 0.3 - 1.2 mg/dL   GFR calc non  Af Amer 4 (L) >60 mL/min   GFR calc Af Amer 5 (L) >60 mL/min    Comment: (NOTE) The eGFR has been calculated using the CKD EPI equation. This calculation has not been validated in all clinical situations. eGFR's persistently <60 mL/min signify possible Chronic Kidney Disease.    Anion gap 18 (H) 5 - 15  Lipase, blood     Status: None   Collection Time: 09/04/16  6:34 PM  Result Value Ref Range   Lipase 50 11 - 51 U/L  Lactic acid, plasma     Status: Abnormal   Collection Time: 09/04/16  6:34 PM  Result Value Ref Range   Lactic Acid, Venous 4.5 (HH) 0.5 - 1.9 mmol/L    Comment: CRITICAL RESULT CALLED TO, READ BACK BY AND VERIFIED WITH [name] AT 1930 ON 09/04/2016 JLJ   Troponin I     Status: Abnormal   Collection Time: 09/04/16  6:34 PM  Result Value Ref Range   Troponin I 0.03 (HH) <0.03 ng/mL    Comment: CRITICAL RESULT CALLED TO, READ BACK BY AND VERIFIED WITH [name] AT 1930 ON 09/04/2016 JLJ   Ammonia     Status: None   Collection Time: 09/04/16  6:34 PM  Result Value Ref Range   Ammonia 34 9 - 35 umol/L  Influenza panel by PCR (type A & B)     Status: None   Collection Time: 09/04/16  7:14 PM  Result Value Ref Range   Influenza A By PCR NEGATIVE NEGATIVE   Influenza B By PCR NEGATIVE NEGATIVE    Comment: (NOTE) The Xpert Xpress Flu assay is intended as an aid in the diagnosis of  influenza and should not be used as a sole basis for treatment.  This  assay is FDA approved for nasopharyngeal swab specimens only. Nasal  washings and aspirates are unacceptable for Xpert Xpress Flu testing.   Protime-INR     Status: None   Collection Time: 09/04/16  9:01 PM  Result Value Ref Range   Prothrombin Time 13.6 11.4 - 15.2 seconds   INR 1.04    Blood culture (routine x 2)     Status: None (Preliminary result)   Collection Time: 09/04/16  9:01 PM  Result Value Ref Range   Specimen Description BLOOD RIGHT FOREARM    Special Requests BOTTLES DRAWN AEROBIC AND ANAEROBIC  BCHV    Culture  Setup Time      GRAM NEGATIVE RODS IN BOTH AEROBIC AND ANAEROBIC BOTTLES CRITICAL RESULT CALLED TO, READ BACK BY AND VERIFIED WITH: HANK ZOMPA 09/05/16 1128 SGD    Culture GRAM NEGATIVE RODS    Report Status PENDING   Blood culture (routine x 2)     Status: Abnormal (Preliminary result)   Collection Time: 09/04/16  9:01 PM  Result Value Ref Range   Specimen Description BLOOD RIGHT ANTECUBITAL    Special Requests BOTTLES DRAWN AEROBIC AND ANAEROBIC  BCHV    Culture  Setup Time      Organism ID to follow GRAM NEGATIVE RODS IN BOTH AEROBIC AND ANAEROBIC BOTTLES CRITICAL RESULT CALLED TO, READ BACK BY AND VERIFIED WITH: HANK ZOMPA 09/05/16 1128 SGD    Culture (A)     ESCHERICHIA COLI SUSCEPTIBILITIES TO FOLLOW Performed at [facility], [place] [address], [place], [place] [address]    Report Status PENDING   Lactic acid, plasma     Status: Abnormal   Collection Time: 09/04/16  9:01 PM  Result  Value Ref Range   Lactic Acid, Venous 5.0 (HH) 0.5 - 1.9 mmol/L    Comment: CRITICAL RESULT CALLED TO, READ BACK BY AND VERIFIED WITH STACEY CLAY AT 2209 09/04/2016 JLJ   Blood Culture ID Panel (Reflexed)     Status: Abnormal   Collection Time: 09/04/16  9:01 PM  Result Value Ref Range   Enterococcus species NOT DETECTED NOT DETECTED   Listeria monocytogenes NOT DETECTED NOT DETECTED   Staphylococcus species NOT DETECTED NOT DETECTED   Staphylococcus aureus NOT DETECTED NOT DETECTED   Streptococcus species NOT DETECTED NOT DETECTED   Streptococcus agalactiae NOT DETECTED NOT DETECTED   Streptococcus pneumoniae NOT DETECTED NOT DETECTED   Streptococcus pyogenes NOT DETECTED NOT DETECTED   Acinetobacter baumannii NOT DETECTED NOT DETECTED    Enterobacteriaceae species DETECTED (A) NOT DETECTED    Comment: Enterobacteriaceae represent a large family of gram-negative bacteria, not a single organism. CRITICAL RESULT CALLED TO, READ BACK BY AND VERIFIED WITH: HANK ZOMPA 09/05/16 1128 SGD    Enterobacter cloacae complex NOT DETECTED NOT DETECTED   Escherichia coli DETECTED (A) NOT DETECTED    Comment: CRITICAL RESULT CALLED TO, READ BACK BY AND VERIFIED WITH: HANK ZOMPA 09/05/16 1128 SGD    Klebsiella oxytoca NOT DETECTED NOT DETECTED   Klebsiella pneumoniae NOT DETECTED NOT DETECTED   Proteus species NOT DETECTED NOT DETECTED   Serratia marcescens NOT DETECTED NOT DETECTED   Carbapenem resistance NOT DETECTED NOT DETECTED   Haemophilus influenzae NOT DETECTED NOT DETECTED   Neisseria meningitidis NOT DETECTED NOT DETECTED   Pseudomonas aeruginosa NOT DETECTED NOT DETECTED   Candida albicans NOT DETECTED NOT DETECTED   Candida glabrata NOT DETECTED NOT DETECTED   Candida krusei NOT DETECTED NOT DETECTED   Candida parapsilosis NOT DETECTED NOT DETECTED   Candida tropicalis NOT DETECTED NOT DETECTED  MRSA PCR Screening     Status: None   Collection Time: 09/04/16 11:14 PM  Result Value Ref Range   MRSA by PCR NEGATIVE NEGATIVE    Comment:        The GeneXpert MRSA Assay (FDA approved for NASAL specimens only), is one component of a comprehensive MRSA colonization surveillance program. It is not intended to diagnose MRSA infection nor to guide or monitor treatment for MRSA infections.   Glucose, capillary     Status: Abnormal   Collection Time: 09/04/16 11:44 PM  Result Value Ref Range   Glucose-Capillary 162 (H) 65 - 99 mg/dL  Lactic acid, plasma     Status: Abnormal   Collection Time: 09/05/16  1:04 AM  Result Value Ref Range   Lactic Acid, Venous 5.7 (HH) 0.5 - 1.9 mmol/L    Comment: CRITICAL RESULT CALLED TO, READ BACK BY AND VERIFIED WITH STACY CLAY ON 09/05/16 AT 0200 BY TLB   Troponin I     Status: Abnormal    Collection Time: 09/05/16  1:04 AM  Result Value Ref Range   Troponin I 0.03 (HH) <0.03 ng/mL    Comment: CRITICAL VALUE NOTED.  VALUE IS CONSISTENT WITH PREVIOUSLY REPORTED AND CALLED VALUE. CORRECTED ON 03/23 AT 0953: PREVIOUSLY REPORTED AS 0.03 CRITICAL RESULT CALLED TO, READ BACK BY AND VERIFIED WITH TLB   Troponin I     Status: Abnormal   Collection Time: 09/05/16  6:07 AM  Result Value Ref Range   Troponin I 0.03 (HH) <0.03 ng/mL    Comment: CRITICAL RESULT CALLED TO, READ BACK BY AND VERIFIED WITH JEANA DENSON AT 0724 322/18 DAS  Basic metabolic panel     Status: Abnormal   Collection Time: 09/05/16  6:07 AM  Result Value Ref Range   Sodium 138 135 - 145 mmol/L   Potassium 6.9 (HH) 3.5 - 5.1 mmol/L    Comment: CRITICAL RESULT CALLED TO, READ BACK BY AND VERIFIED WITH JEANA DENSON AT 7353 09/05/16 DAS    Chloride 99 (L) 101 - 111 mmol/L   CO2 24 22 - 32 mmol/L   Glucose, Bld 142 (H) 65 - 99 mg/dL   BUN 59 (H) 6 - 20 mg/dL   Creatinine, Ser 11.40 (H) 0.61 - 1.24 mg/dL   Calcium 7.5 (L) 8.9 - 10.3 mg/dL   GFR calc non Af Amer 4 (L) >60 mL/min   GFR calc Af Amer 5 (L) >60 mL/min    Comment: (NOTE) The eGFR has been calculated using the CKD EPI equation. This calculation has not been validated in all clinical situations. eGFR's persistently <60 mL/min signify possible Chronic Kidney Disease.    Anion gap 15 5 - 15  CBC     Status: Abnormal   Collection Time: 09/05/16  6:07 AM  Result Value Ref Range   WBC 17.1 (H) 3.8 - 10.6 K/uL   RBC 4.89 4.40 - 5.90 MIL/uL   Hemoglobin 11.5 (L) 13.0 - 18.0 g/dL   HCT 36.8 (L) 40.0 - 52.0 %   MCV 75.1 (L) 80.0 - 100.0 fL   MCH 23.6 (L) 26.0 - 34.0 pg   MCHC 31.4 (L) 32.0 - 36.0 g/dL   RDW 19.4 (H) 11.5 - 14.5 %   Platelets 223 150 - 440 K/uL  Glucose, capillary     Status: Abnormal   Collection Time: 09/05/16  7:47 AM  Result Value Ref Range   Glucose-Capillary 117 (H) 65 - 99 mg/dL   Comment 1 Notify RN   Hepatitis B  surface antigen     Status: None   Collection Time: 09/05/16 12:12 PM  Result Value Ref Range   Hepatitis B Surface Ag Negative Negative    Comment: (NOTE) Performed At: [facility] [place], [place] [id number] Lindon Romp MD MH:[id number]   Glucose, capillary     Status: None   Collection Time: 09/05/16  2:08 PM  Result Value Ref Range   Glucose-Capillary 87 65 - 99 mg/dL   Comment 1 Notify RN   Glucose, capillary     Status: Abnormal   Collection Time: 09/05/16  4:57 PM  Result Value Ref Range   Glucose-Capillary 159 (H) 65 - 99 mg/dL   Comment 1 Notify RN   Glucose, capillary     Status: Abnormal   Collection Time: 09/05/16  9:45 PM  Result Value Ref Range   Glucose-Capillary 128 (H) 65 - 99 mg/dL   Comment 1 Notify RN   Lactic acid, plasma     Status: None   Collection Time: 09/06/16  5:09 AM  Result Value Ref Range   Lactic Acid, Venous 0.9 0.5 - 1.9 mmol/L  Comprehensive metabolic panel     Status: Abnormal   Collection Time: 09/06/16  5:09 AM  Result Value Ref Range   Sodium 140 135 - 145 mmol/L   Potassium 4.8 3.5 - 5.1 mmol/L   Chloride 98 (L) 101 - 111 mmol/L   CO2 29 22 - 32 mmol/L   Glucose, Bld 114 (H) 65 - 99 mg/dL   BUN 28 (H) 6 - 20 mg/dL   Creatinine, Ser 7.37 (H) 0.61 -  1.24 mg/dL   Calcium 8.1 (L) 8.9 - 10.3 mg/dL   Total Protein 7.1 6.5 - 8.1 g/dL   Albumin 3.2 (L) 3.5 - 5.0 g/dL   AST 160 (H) 15 - 41 U/L   ALT 208 (H) 17 - 63 U/L   Alkaline Phosphatase 257 (H) 38 - 126 U/L   Total Bilirubin 3.1 (H) 0.3 - 1.2 mg/dL   GFR calc non Af Amer 7 (L) >60 mL/min   GFR calc Af Amer 8 (L) >60 mL/min    Comment: (NOTE) The eGFR has been calculated using the CKD EPI equation. This calculation has not been validated in all clinical situations. eGFR's persistently <60 mL/min signify possible Chronic Kidney Disease.    Anion gap 13 5 - 15  Glucose, capillary     Status: Abnormal   Collection Time: 09/06/16  7:45 AM  Result Value  Ref Range   Glucose-Capillary 105 (H) 65 - 99 mg/dL  Vancomycin, random     Status: None   Collection Time: 09/06/16 11:52 AM  Result Value Ref Range   Vancomycin Rm 18     Comment:        Random Vancomycin therapeutic range is dependent on dosage and time of specimen collection. A peak range is 20.0-40.0 ug/mL A trough range is 5.0-15.0 ug/mL          Glucose, capillary     Status: Abnormal   Collection Time: 09/06/16 11:58 AM  Result Value Ref Range   Glucose-Capillary 199 (H) 65 - 99 mg/dL   No components found for: ESR, C REACTIVE PROTEIN MICRO: Recent Results (from the past 720 hour(s))  Blood culture (routine x 2)     Status: None (Preliminary result)   Collection Time: 09/04/16  9:01 PM  Result Value Ref Range Status   Specimen Description BLOOD RIGHT FOREARM  Final   Special Requests BOTTLES DRAWN AEROBIC AND ANAEROBIC  BCHV  Final   Culture  Setup Time   Final    GRAM NEGATIVE RODS IN BOTH AEROBIC AND ANAEROBIC BOTTLES CRITICAL RESULT CALLED TO, READ BACK BY AND VERIFIED WITH: HANK ZOMPA 09/05/16 1128 SGD    Culture GRAM NEGATIVE RODS  Final   Report Status PENDING  Incomplete  Blood culture (routine x 2)     Status: Abnormal (Preliminary result)   Collection Time: 09/04/16  9:01 PM  Result Value Ref Range Status   Specimen Description BLOOD RIGHT ANTECUBITAL  Final   Special Requests BOTTLES DRAWN AEROBIC AND ANAEROBIC  BCHV  Final   Culture  Setup Time   Final    Organism ID to follow GRAM NEGATIVE RODS IN BOTH AEROBIC AND ANAEROBIC BOTTLES CRITICAL RESULT CALLED TO, READ BACK BY AND VERIFIED WITH: HANK ZOMPA 09/05/16 1128 SGD    Culture (A)  Final    ESCHERICHIA COLI SUSCEPTIBILITIES TO FOLLOW Performed at [facility], [place] [address] [place], [place] [address]    Report Status PENDING  Incomplete  Blood Culture ID Panel (Reflexed)     Status: Abnormal   Collection Time: 09/04/16  9:01 PM  Result Value Ref Range Status   Enterococcus species NOT  DETECTED NOT DETECTED Final   Listeria monocytogenes NOT DETECTED NOT DETECTED Final   Staphylococcus species NOT DETECTED NOT DETECTED Final   Staphylococcus aureus NOT DETECTED NOT DETECTED Final   Streptococcus species NOT DETECTED NOT DETECTED Final   Streptococcus agalactiae NOT DETECTED NOT DETECTED Final   Streptococcus pneumoniae NOT DETECTED NOT DETECTED Final  Streptococcus pyogenes NOT DETECTED NOT DETECTED Final   Acinetobacter baumannii NOT DETECTED NOT DETECTED Final   Enterobacteriaceae species DETECTED (A) NOT DETECTED Final    Comment: Enterobacteriaceae represent a large family of gram-negative bacteria, not a single organism. CRITICAL RESULT CALLED TO, READ BACK BY AND VERIFIED WITH: HANK ZOMPA 09/05/16 1128 SGD    Enterobacter cloacae complex NOT DETECTED NOT DETECTED Final   Escherichia coli DETECTED (A) NOT DETECTED Final    Comment: CRITICAL RESULT CALLED TO, READ BACK BY AND VERIFIED WITH: HANK ZOMPA 09/05/16 1128 SGD    Klebsiella oxytoca NOT DETECTED NOT DETECTED Final   Klebsiella pneumoniae NOT DETECTED NOT DETECTED Final   Proteus species NOT DETECTED NOT DETECTED Final   Serratia marcescens NOT DETECTED NOT DETECTED Final   Carbapenem resistance NOT DETECTED NOT DETECTED Final   Haemophilus influenzae NOT DETECTED NOT DETECTED Final   Neisseria meningitidis NOT DETECTED NOT DETECTED Final   Pseudomonas aeruginosa NOT DETECTED NOT DETECTED Final   Candida albicans NOT DETECTED NOT DETECTED Final   Candida glabrata NOT DETECTED NOT DETECTED Final   Candida krusei NOT DETECTED NOT DETECTED Final   Candida parapsilosis NOT DETECTED NOT DETECTED Final   Candida tropicalis NOT DETECTED NOT DETECTED Final  MRSA PCR Screening     Status: None   Collection Time: 09/04/16 11:14 PM  Result Value Ref Range Status   MRSA by PCR NEGATIVE NEGATIVE Final    Comment:        The GeneXpert MRSA Assay (FDA approved for NASAL specimens only), is one component of  a comprehensive MRSA colonization surveillance program. It is not intended to diagnose MRSA infection nor to guide or monitor treatment for MRSA infections.     IMAGING: Ct Chest Wo Contrast  Result Date: 09/04/2016 CLINICAL DATA:  Evaluate for pneumonia.  Sepsis. EXAM: CT CHEST WITHOUT CONTRAST TECHNIQUE: Multidetector CT imaging of the chest was performed following the standard protocol without IV contrast. COMPARISON:  None. FINDINGS: Cardiovascular: Previous median sternotomy and CABG procedure. Moderate cardiac enlargement. Aortic atherosclerosis. No pericardial effusion. Mediastinum/Nodes: No enlarged mediastinal or axillary lymph nodes. Thyroid gland, trachea, and esophagus demonstrate no significant findings. Lungs/Pleura: No pleural fluid. Atelectasis and airspace consolidation involving the left lower lobe is identified. Focal area of ground-glass attenuation in the right upper lobe measures 2.1 cm, image 43 of series 3. Upper Abdomen: No acute abnormality. Bilateral renal cortical atrophy identified. Compatible with known end-stage renal disease. 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 No chest wall mass or suspicious bone lesions identified. IMPRESSION: 1. Left lower lobe atelectasis and consolidation compatible with suspected pneumonia and or aspiration. 2. Right upper lobe ground-glass density. Initial follow-up with CT at 6-12 months is recommended to confirm persistence. If persistent, repeat CT is recommended every 2 years until 5 years of stability has been established. This recommendation follows the consensus statement: Guidelines for Management of Incidental Pulmonary Nodules Detected on CT Images: From the Fleischner Society 2017; Radiology 2017; 284:228-243. Electronically Signed   By: Kerby Moors M.D.   On: 09/04/2016 20:05   Mr Abdomen Mrcp Wo Contrast  Result Date: 09/06/2016 CLINICAL DATA:  Nausea with cough.  Elevated  LFTs. EXAM: MRI ABDOMEN WITHOUT CONTRAST  (INCLUDING MRCP) TECHNIQUE: Multiplanar multisequence MR imaging of the abdomen was performed. Heavily T2-weighted images of the biliary and pancreatic ducts were obtained, and three-dimensional MRCP images were rendered by post processing. COMPARISON:  None. FINDINGS: Lower chest:  Left lower lobe atelectasis. Hepatobiliary: No focal  abnormality within the liver parenchyma. Lower signal intensity of the liver parenchyma on in phase phase T1 weighted imaging compared to out of phase T1 weighted imaging is compatible with iron overload. Mild intrahepatic biliary duct prominence is evident. Extrahepatic common duct measures 10 mm diameter. Common bile duct in the head of the pancreas is 8 mm diameter. Despite no definite stones in the lumen of the gallbladder. Several tiny 2-3 mm stones are identified in the distal common bile duct (well demonstrated on coronal image 5 of series 9). Pancreas: No focal mass lesion. No dilatation of the main duct. No intraparenchymal cyst. No peripancreatic edema. Spleen: No splenomegaly. No focal mass lesion. Adrenals/Urinary Tract: Both kidneys are markedly atrophic with tiny renal cysts noted bilaterally. Stomach/Bowel: Stomach is nondistended. No gastric wall thickening. No evidence of outlet obstruction. Duodenum is normally positioned as is the ligament of Treitz. No evidence for small bowel or colonic dilatation within the visualized abdomen. Vascular/Lymphatic: No abdominal aortic aneurysm no evidence for lymphadenopathy in the abdomen. Other: No free fluid. Musculoskeletal: No abnormal marrow signal within the visualized bony anatomy. IMPRESSION: 1. Multiple tiny stones in the distal common bile duct, consistent with choledocholithiasis. 2. Loss of signal intensity in the liver parenchyma on in phase T1 weighted imaging is compatible with an iron overload syndrome. Electronically Signed   By: Misty Stanley M.D.   On: 09/06/2016 15:16    Mr 3d Recon At Scanner  Result Date: 09/06/2016 CLINICAL DATA:  Nausea with cough.  Elevated LFTs. EXAM: MRI ABDOMEN WITHOUT CONTRAST  (INCLUDING MRCP) TECHNIQUE: Multiplanar multisequence MR imaging of the abdomen was performed. Heavily T2-weighted images of the biliary and pancreatic ducts were obtained, and three-dimensional MRCP images were rendered by post processing. COMPARISON:  None. FINDINGS: Lower chest:  Left lower lobe atelectasis. Hepatobiliary: No focal abnormality within the liver parenchyma. Lower signal intensity of the liver parenchyma on in phase phase T1 weighted imaging compared to out of phase T1 weighted imaging is compatible with iron overload. Mild intrahepatic biliary duct prominence is evident. Extrahepatic common duct measures 10 mm diameter. Common bile duct in the head of the pancreas is 8 mm diameter. Despite no definite stones in the lumen of the gallbladder. Several tiny 2-3 mm stones are identified in the distal common bile duct (well demonstrated on coronal image 5 of series 9). Pancreas: No focal mass lesion. No dilatation of the main duct. No intraparenchymal cyst. No peripancreatic edema. Spleen: No splenomegaly. No focal mass lesion. Adrenals/Urinary Tract: Both kidneys are markedly atrophic with tiny renal cysts noted bilaterally. Stomach/Bowel: Stomach is nondistended. No gastric wall thickening. No evidence of outlet obstruction. Duodenum is normally positioned as is the ligament of Treitz. No evidence for small bowel or colonic dilatation within the visualized abdomen. Vascular/Lymphatic: No abdominal aortic aneurysm no evidence for lymphadenopathy in the abdomen. Other: No free fluid. Musculoskeletal: No abnormal marrow signal within the visualized bony anatomy. IMPRESSION: 1. Multiple tiny stones in the distal common bile duct, consistent with choledocholithiasis. 2. Loss of signal intensity in the liver parenchyma on in phase T1 weighted imaging is compatible with  an iron overload syndrome. Electronically Signed   By: Misty Stanley M.D.   On: 09/06/2016 15:16   Dg Chest Portable 1 View  Result Date: 09/04/2016 CLINICAL DATA:  Lethargy EXAM: PORTABLE CHEST 1 VIEW COMPARISON:  07/23/2016 FINDINGS: Right lung is grossly clear. Post sternotomy changes. Probable small left effusion. Dense consolidation at the lingula and left lung base, not significantly changed.  Cardiomegaly. No pneumothorax. Vascular stent in the left upper extremity IMPRESSION: 1. Cardiomegaly 2. Dense consolidation in the lingula and left lower lobe with probable small left effusion. Radiographic follow-up to resolution is advised. Electronically Signed   By: Donavan Foil M.D.   On: 09/04/2016 18:55   US Abdomen Limited Ruq  Result Date: 09/06/2016 CLINICAL DATA:  Elevated liver function tests. EXAM: US ABDOMEN LIMITED - RIGHT UPPER QUADRANT COMPARISON:  None. FINDINGS: Gallbladder: Gallbladder wall is upper normal in thickness at 3 mm. No pericholecystic fluid, sonographic Murphy's sign or other secondary signs of acute cholecystitis. No gallstones seen. Common bile duct: Diameter: Common bile duct mildly distended distally measuring 8 mm diameter. CBD measures 7 mm at the porta hepatis. No bile duct stone demonstrated. Liver: Liver parenchyma is at least mildly echogenic throughout suggesting fatty infiltration. No focal liver abnormality identified. Incidental note made of a diffusely echogenic kidney, small in size, likely indicating chronic medical renal disease. IMPRESSION: 1. Mildly distended common bile duct, measuring 8 mm distally. Given the elevated liver function tests, consider MRCP for more definitive characterization. 2. Probable fatty infiltration of the liver. 3. No gallstones.  No evidence of cholecystitis. 4. Small echogenic right kidney, incompletely evaluated, suggesting chronic medical renal disease. Electronically Signed   By: Franki Cabot M.D.   On: 09/06/2016 09:09     Assessment:   [name] is a 66 y.o. male admitted with sepsis and GI sxs. E coli bacteremia noted and appears to have a biliary source with MRI showing choledocholithiasis.   Recommendations Would change cefepime to meropenem (given potential for ESBL)  but can deescalate based on sensitivities Would see if GI thinks needs ERCP Will need 14 day course of abx but if sensitive can be given orally   Thank you very much for allowing me to participate in the care of this patient. Please call with questions.   Cheral Marker. [name], MD

## 2016-09-07 DIAGNOSIS — N186 End stage renal disease: Secondary | ICD-10-CM

## 2016-09-07 DIAGNOSIS — K8050 Calculus of bile duct without cholangitis or cholecystitis without obstruction: Secondary | ICD-10-CM

## 2016-09-07 DIAGNOSIS — Z992 Dependence on renal dialysis: Secondary | ICD-10-CM

## 2016-09-07 LAB — CBC
HEMATOCRIT: 34.9 % — AB (ref 40.0–52.0)
HEMOGLOBIN: 11 g/dL — AB (ref 13.0–18.0)
MCH: 23.6 pg — AB (ref 26.0–34.0)
MCHC: 31.4 g/dL — AB (ref 32.0–36.0)
MCV: 75 fL — ABNORMAL LOW (ref 80.0–100.0)
Platelets: 194 10*3/uL (ref 150–440)
RBC: 4.65 MIL/uL (ref 4.40–5.90)
RDW: 18.8 % — AB (ref 11.5–14.5)
WBC: 8 10*3/uL (ref 3.8–10.6)

## 2016-09-07 LAB — COMPREHENSIVE METABOLIC PANEL
ALK PHOS: 257 U/L — AB (ref 38–126)
ALT: 151 U/L — ABNORMAL HIGH (ref 17–63)
ANION GAP: 12 (ref 5–15)
AST: 89 U/L — ABNORMAL HIGH (ref 15–41)
Albumin: 3.2 g/dL — ABNORMAL LOW (ref 3.5–5.0)
BILIRUBIN TOTAL: 1.7 mg/dL — AB (ref 0.3–1.2)
BUN: 39 mg/dL — AB (ref 6–20)
CO2: 30 mmol/L (ref 22–32)
Calcium: 8.1 mg/dL — ABNORMAL LOW (ref 8.9–10.3)
Chloride: 98 mmol/L — ABNORMAL LOW (ref 101–111)
Creatinine, Ser: 10 mg/dL — ABNORMAL HIGH (ref 0.61–1.24)
GFR calc non Af Amer: 5 mL/min — ABNORMAL LOW (ref >60)
GFR, EST AFRICAN AMERICAN: 6 mL/min — AB (ref >60)
Glucose, Bld: 129 mg/dL — ABNORMAL HIGH (ref 65–99)
POTASSIUM: 4.5 mmol/L (ref 3.5–5.1)
SODIUM: 140 mmol/L (ref 135–145)
Total Protein: 7.5 g/dL (ref 6.5–8.1)

## 2016-09-07 LAB — CULTURE, BLOOD (ROUTINE X 2)

## 2016-09-07 LAB — GLUCOSE, CAPILLARY
GLUCOSE-CAPILLARY: 95 mg/dL (ref 65–99)
GLUCOSE-CAPILLARY: 106 mg/dL — AB (ref 65–99)
Glucose-Capillary: 105 mg/dL — ABNORMAL HIGH (ref 65–99)
Glucose-Capillary: 107 mg/dL — ABNORMAL HIGH (ref 65–99)

## 2016-09-07 MED ORDER — CIPROFLOXACIN HCL 500 MG PO TABS
500.0000 mg | ORAL_TABLET | Freq: Every day | ORAL | Status: DC
  Administered 2016-09-07 – 2016-09-08 (×2): 500 mg via ORAL
  Filled 2016-09-07 (×2): qty 1

## 2016-09-07 NOTE — Consult Note (Signed)
Patient ID: [name], male   DOB: 10/23/1950, 66 y.o.   MRN: [id number]  HPI [name] is a 66 y.o. male with multiple comorbidities including end-stage renal disease on dialysis, DM, HTN CHF, coronary artery disease status post stent placement and CABG in 2017. Admitted a few days ago with fever, right shoulder pain fevers and chills. He did have a recent hospitalization last month for similar issues but apparently resolving spontaneously. He had a pneumonia that time that was treated. Hospitalization and repeat ultrasound and of the right upper quadrant showed dilated of the common bile duct with potential stone and MRCP confirmed choledocholithiasis. There were small stones in the distal common bile duct. His LFTs including his total bilirubin has significantly improved over the last 48 hours. His creatinine today is 10 and nephrology was being involved for dialysis. He reports no abdominal pain today and apparently he has been tolerating some diet. HPI  Past Medical History:  Diagnosis Date  . Chronic diastolic congestive heart failure ([place])   . Chronic disease anemia   . ESRD (end stage renal disease) on dialysis ([place])    "[facility] [place]; [name]" (09/29/2014)  . GERD (gastroesophageal reflux disease)   . Gout   . High cholesterol   . History of blood transfusion    "related to anemia"  . History of stomach ulcers   . Hypertension   . Type II diabetes mellitus ([place])     Past Surgical History:  Procedure Laterality Date  . ARTERIOVENOUS GRAFT PLACEMENT Left ~ 1996  . CARDIAC CATHETERIZATION  09/29/2014   "[place]"  . CARDIAC CATHETERIZATION N/A 02/26/2016   Procedure: Left Heart Cath and Coronary Angiography Possible PCI;  Surgeon: [name], MD;  Location: [facility];  Service: Cardiovascular;  Laterality: N/A;  . KIDNEY TRANSPLANT Right 2004  . NEPHRECTOMY TRANSPLANTED ORGAN  2015  . PERCUTANEOUS CORONARY STENT INTERVENTION (PCI-S) N/A 09/30/2014   Procedure:  PERCUTANEOUS CORONARY STENT INTERVENTION (PCI-S);  Surgeon: [name], MD;  Location: [facility] C[facility];  Service: Cardiovascular;  Laterality: N/A;  . PERITONEAL CATHETER INSERTION  02/2014  . PERITONEAL CATHETER REMOVAL  08/2014  . THROMBECTOMY / ARTERIOVENOUS GRAFT REVISION  2015    Family History  Problem Relation Age of Onset  . Hypertension Mother   . Stroke Mother   . Hypertension Father   . Diabetes Mellitus II Father   . Asthma Father     Social History Social History  Substance Use Topics  . Smoking status: Former Smoker    Types: Cigarettes  . Smokeless tobacco: Never Used     Comment: "quit smoking cigarettes in the 1980's"  . Alcohol use No    Allergies  Allergen Reactions  . Ivp Dye [Iodinated Diagnostic Agents] Other (See Comments)    Pt denied    Current Facility-Administered Medications  Medication Dose Route Frequency Provider Last Rate Last Dose  . allopurinol (ZYLOPRIM) tablet 100 mg  100 mg Oral Daily Lance Coon, MD   100 mg at 09/07/16 1403  . aspirin EC tablet 81 mg  81 mg Oral Daily Lance Coon, MD   81 mg at 09/07/16 1403  . atorvastatin (LIPITOR) tablet 40 mg  40 mg Oral q1800 Lance Coon, MD   40 mg at 09/07/16 1814  . ciprofloxacin (CIPRO) tablet 500 mg  500 mg Oral Q1500 Sheema M Hallaji, RPH   500 mg at 09/07/16 1403  . clopidogrel (PLAVIX) tablet 75 mg  75 mg Oral Q breakfast Shanon Brow  Jannifer Franklin, MD   75 mg at 09/07/16 1358  . heparin injection 5,000 Units  5,000 Units Subcutaneous Q8H Lance Coon, MD   5,000 Units at 09/07/16 1403  . insulin aspart (novoLOG) injection 0-5 Units  0-5 Units Subcutaneous QHS Lance Coon, MD      . insulin aspart (novoLOG) injection 0-9 Units  0-9 Units Subcutaneous TID WC Lance Coon, MD   1 Units at 09/06/16 1721  . levETIRAcetam (KEPPRA) tablet 1,000 mg  1,000 mg Oral Daily Lance Coon, MD   1,000 mg at 09/06/16 0948  . levETIRAcetam (KEPPRA) tablet 250 mg  250 mg Oral Q T,Th,Sa-HD Lance Coon, MD   250 mg at  09/07/16 1402  . meropenem (MERREM) IVPB SOLR 500 mg  500 mg Intravenous Q24H Ramond Dial, RPH   500 mg at 09/07/16 1814  . ondansetron (ZOFRAN) tablet 4 mg  4 mg Oral Q6H PRN Lance Coon, MD       Or  . ondansetron [facility]) injection 4 mg  4 mg Intravenous Q6H PRN Lance Coon, MD      . pantoprazole (PROTONIX) EC tablet 40 mg  40 mg Oral Daily Lance Coon, MD   40 mg at 09/07/16 1359     Review of Systems A 10 point review of systems was asked and was negative except for the information on the HPI  Physical Exam Blood pressure 130/89, pulse 88, temperature 98.1 F (36.7 C), temperature source Oral, resp. rate (!) 22, height 4\' 9"  (1.448 m), weight 70.3 kg (154 lb 15.7 oz), SpO2 99 %. CONSTITUTIONAL: NAD EYES: Pupils are equal, round, and reactive to light, Sclera are non-icteric. EARS, NOSE, MOUTH AND THROAT: The oropharynx is clear. The oral mucosa is pink and moist. Hearing is intact to voice. LYMPH NODES:  Lymph nodes in the neck are normal. RESPIRATORY:  Lungs are clear. There is normal respiratory effort, with equal breath sounds bilaterally, and without pathologic use of accessory muscles. CARDIOVASCULAR: Heart is regular without murmurs, gallops, or rubs. GI: The abdomen is soft, nontender, and nondistended. There are no palpable masses. There is no hepatosplenomegaly. There are normal bowel sounds in all quadrants. GU: Rectal deferred.   MUSCULOSKELETAL: Normal muscle strength and tone. No cyanosis or edema.   SKIN: Turgor is good and there are no pathologic skin lesions or ulcers. NEUROLOGIC: Motor and sensation is grossly normal. Cranial nerves are grossly intact. PSYCH:  Oriented to person, place and time. Affect is normal.  Data Reviewed  I have personally reviewed the patient's imaging, laboratory findings and medical records.    Assessment/Plan [place] cholelithiasis causing some partial biliary obstruction and some transient cholangitis that is improving.  Patient has no peritonitis no evidence of sepsis or true cholangitis with sepsis. No need for emergent surgical intervention at this time. I do think that it is reasonable for him to have an ERCP on Monday. I discussed the case in detail with GI. We will definitely need a cholecystectomy after his biliary obstruction is dealt with. We will continue to follow him  Caroleen Hamman, MD [place] [facility] 09/07/2016, 6:33 PM

## 2016-09-07 NOTE — Progress Notes (Signed)
HD COMPLETED  

## 2016-09-07 NOTE — Progress Notes (Signed)
POST DIALYSIS ASSESSMENT 

## 2016-09-07 NOTE — Progress Notes (Signed)
[facility]  ROUNDING NOTE   Subjective:   Seen and examined on hemodialysis. Tolerating treatment well. Uf of 1.5 litres    HEMODIALYSIS FLOWSHEET:  Blood Flow Rate (mL/min): 400 mL/min Arterial Pressure (mmHg): -130 mmHg Venous Pressure (mmHg): 2350 mmHg Transmembrane Pressure (mmHg): 60 mmHg Ultrafiltration Rate (mL/min): 670 mL/min Dialysate Flow Rate (mL/min): 600 ml/min Conductivity: Machine : 14 Conductivity: Machine : 14 Dialysis Fluid Bolus: Normal Saline Bolus Amount (mL): 250 mL Dialysate Change:  (3k) Intra-Hemodialysis Comments: 511ml (resting with eyes open, watching TV)    Objective:  Vital signs in last 24 hours:  Temp:  [98.1 F (36.7 C)-98.3 F (36.8 C)] 98.3 F (36.8 C) (03/24 0945) Pulse Rate:  [72-84] 72 (03/24 1030) Resp:  [17-19] 17 (03/24 1030) BP: (120-170)/(65-96) 161/96 (03/24 1030) SpO2:  [96 %-99 %] 99 % (03/24 0536) Weight:  [71.6 kg (157 lb 13.6 oz)-72.3 kg (159 lb 6.3 oz)] 72.3 kg (159 lb 6.3 oz) (03/24 0945)  Weight change: -2.3 kg (-5 lb 1.1 oz) Filed Weights   09/06/16 0500 09/07/16 0500 09/07/16 0945  Weight: 70.5 kg (155 lb 6.8 oz) 71.6 kg (157 lb 13.6 oz) 72.3 kg (159 lb 6.3 oz)    Intake/Output: I/O last 3 completed shifts: In: 240 [P.O.:240] Out: 0    Intake/Output this shift:  No intake/output data recorded.  Physical Exam: General: NAD,   Head: Normocephalic, atraumatic. Moist oral mucosal membranes  Eyes: Anicteric, PERRL  Neck: Supple, trachea midline  Lungs:  Clear to auscultation  Heart: Regular rate and rhythm  Abdomen:  Soft, nontender,   Extremities: no peripheral edema.  Neurologic: Nonfocal, moving all four extremities  Skin: No lesions  Access: Left AVF    Basic Metabolic Panel:  Recent Labs Lab 09/04/16 1834 09/05/16 0607 09/06/16 0509 09/07/16 0330  NA 137 138 140 140  K 4.1 6.9* 4.8 4.5  CL 93* 99* 98* 98*  CO2 26 24 29 30   GLUCOSE 210* 142* 114* 129*  BUN 51* 59* 28* 39*   CREATININE 10.77* 11.40* 7.37* 10.00*  CALCIUM 8.4* 7.5* 8.1* 8.1*    Liver Function Tests:  Recent Labs Lab 09/04/16 1834 09/06/16 0509 09/07/16 0330  AST 841* 160* 89*  ALT 456* 208* 151*  ALKPHOS 336* 257* 257*  BILITOT 3.3* 3.1* 1.7*  PROT 8.9* 7.1 7.5  ALBUMIN 4.1 3.2* 3.2*    Recent Labs Lab 09/04/16 1834  LIPASE 50    Recent Labs Lab 09/04/16 1834  AMMONIA 34    CBC:  Recent Labs Lab 09/04/16 1834 09/05/16 0607 09/07/16 0330  WBC 10.0 17.1* 8.0  NEUTROABS 8.9*  --   --   HGB 13.4 11.5* 11.0*  HCT 42.9 36.8* 34.9*  MCV 74.7* 75.1* 75.0*  PLT 241 223 194    Cardiac Enzymes:  Recent Labs Lab 09/04/16 1834 09/05/16 0104 09/05/16 0607  TROPONINI 0.03* 0.03* 0.03*    BNP: Invalid input(s): POCBNP  CBG:  Recent Labs Lab 09/06/16 0745 09/06/16 1158 09/06/16 1707 09/06/16 2157 09/07/16 0726  GLUCAP 105* 199* 131* 156* 95    Microbiology: Results for orders placed or performed during the hospital encounter of 09/04/16  Blood culture (routine x 2)     Status: Abnormal   Collection Time: 09/04/16  9:01 PM  Result Value Ref Range Status   Specimen Description BLOOD RIGHT FOREARM  Final   Special Requests BOTTLES DRAWN AEROBIC AND ANAEROBIC  [place]  Final   Culture  Setup Time   Final  GRAM NEGATIVE RODS IN BOTH AEROBIC AND ANAEROBIC BOTTLES CRITICAL RESULT CALLED TO, READ BACK BY AND VERIFIED WITH: HANK ZOMPA 09/05/16 1128 SGD    Culture (A)  Final    ESCHERICHIA COLI SUSCEPTIBILITIES PERFORMED ON PREVIOUS CULTURE WITHIN THE LAST 5 DAYS. Performed at [facility], [place] [address], [place], [place] [address]    Report Status 09/07/2016 FINAL  Final  Blood culture (routine x 2)     Status: Abnormal   Collection Time: 09/04/16  9:01 PM  Result Value Ref Range Status   Specimen Description BLOOD RIGHT ANTECUBITAL  Final   Special Requests BOTTLES DRAWN AEROBIC AND ANAEROBIC  [facility]  Final   Culture  Setup Time   Final    GRAM  NEGATIVE RODS IN BOTH AEROBIC AND ANAEROBIC BOTTLES CRITICAL RESULT CALLED TO, READ BACK BY AND VERIFIED WITH: HANK ZOMPA 09/05/16 1128 SGD Performed at [facility], [place] [address] [place], [place] [address]    Culture ESCHERICHIA COLI (A)  Final   Report Status 09/07/2016 FINAL  Final   Organism ID, Bacteria ESCHERICHIA COLI  Final      Susceptibility   Escherichia coli - MIC*    AMPICILLIN <=2 SENSITIVE Sensitive     CEFAZOLIN <=4 SENSITIVE Sensitive     CEFEPIME <=1 SENSITIVE Sensitive     CEFTAZIDIME <=1 SENSITIVE Sensitive     CEFTRIAXONE <=1 SENSITIVE Sensitive     CIPROFLOXACIN <=0.25 SENSITIVE Sensitive     GENTAMICIN <=1 SENSITIVE Sensitive     IMIPENEM <=0.25 SENSITIVE Sensitive     TRIMETH/SULFA <=20 SENSITIVE Sensitive     AMPICILLIN/SULBACTAM <=2 SENSITIVE Sensitive     PIP/TAZO <=4 SENSITIVE Sensitive     Extended ESBL NEGATIVE Sensitive     * ESCHERICHIA COLI  Blood Culture ID Panel (Reflexed)     Status: Abnormal   Collection Time: 09/04/16  9:01 PM  Result Value Ref Range Status   Enterococcus species NOT DETECTED NOT DETECTED Final   Listeria monocytogenes NOT DETECTED NOT DETECTED Final   Staphylococcus species NOT DETECTED NOT DETECTED Final   Staphylococcus aureus NOT DETECTED NOT DETECTED Final   Streptococcus species NOT DETECTED NOT DETECTED Final   Streptococcus agalactiae NOT DETECTED NOT DETECTED Final   Streptococcus pneumoniae NOT DETECTED NOT DETECTED Final   Streptococcus pyogenes NOT DETECTED NOT DETECTED Final   Acinetobacter baumannii NOT DETECTED NOT DETECTED Final   Enterobacteriaceae species DETECTED (A) NOT DETECTED Final    Comment: Enterobacteriaceae represent a large family of gram-negative bacteria, not a single organism. CRITICAL RESULT CALLED TO, READ BACK BY AND VERIFIED WITH: HANK ZOMPA 09/05/16 1128 SGD    Enterobacter cloacae complex NOT DETECTED NOT DETECTED Final   Escherichia coli DETECTED (A) NOT DETECTED Final     Comment: CRITICAL RESULT CALLED TO, READ BACK BY AND VERIFIED WITH: HANK ZOMPA 09/05/16 1128 SGD    Klebsiella oxytoca NOT DETECTED NOT DETECTED Final   Klebsiella pneumoniae NOT DETECTED NOT DETECTED Final   Proteus species NOT DETECTED NOT DETECTED Final   Serratia marcescens NOT DETECTED NOT DETECTED Final   Carbapenem resistance NOT DETECTED NOT DETECTED Final   Haemophilus influenzae NOT DETECTED NOT DETECTED Final   Neisseria meningitidis NOT DETECTED NOT DETECTED Final   Pseudomonas aeruginosa NOT DETECTED NOT DETECTED Final   Candida albicans NOT DETECTED NOT DETECTED Final   Candida glabrata NOT DETECTED NOT DETECTED Final   Candida krusei NOT DETECTED NOT DETECTED Final   Candida parapsilosis NOT DETECTED NOT  DETECTED Final   Candida tropicalis NOT DETECTED NOT DETECTED Final  MRSA PCR Screening     Status: None   Collection Time: 09/04/16 11:14 PM  Result Value Ref Range Status   MRSA by PCR NEGATIVE NEGATIVE Final    Comment:        The GeneXpert MRSA Assay (FDA approved for NASAL specimens only), is one component of a comprehensive MRSA colonization surveillance program. It is not intended to diagnose MRSA infection nor to guide or monitor treatment for MRSA infections.     Coagulation Studies:  Recent Labs  09/04/16 2101  LABPROT 13.6  INR 1.04    Urinalysis: No results for input(s): COLORURINE, LABSPEC, PHURINE, GLUCOSEU, HGBUR, BILIRUBINUR, KETONESUR, PROTEINUR, UROBILINOGEN, NITRITE, LEUKOCYTESUR in the last 72 hours.  Invalid input(s): APPERANCEUR    Imaging: Mr Abdomen Mrcp Wo Contrast  Result Date: 09/06/2016 CLINICAL DATA:  Nausea with cough.  Elevated LFTs. EXAM: MRI ABDOMEN WITHOUT CONTRAST  (INCLUDING MRCP) TECHNIQUE: Multiplanar multisequence MR imaging of the abdomen was performed. Heavily T2-weighted images of the biliary and pancreatic ducts were obtained, and three-dimensional MRCP images were rendered by post processing. COMPARISON:   None. FINDINGS: Lower chest:  Left lower lobe atelectasis. Hepatobiliary: No focal abnormality within the liver parenchyma. Lower signal intensity of the liver parenchyma on in phase phase T1 weighted imaging compared to out of phase T1 weighted imaging is compatible with iron overload. Mild intrahepatic biliary duct prominence is evident. Extrahepatic common duct measures 10 mm diameter. Common bile duct in the head of the pancreas is 8 mm diameter. Despite no definite stones in the lumen of the gallbladder. Several tiny 2-3 mm stones are identified in the distal common bile duct (well demonstrated on coronal image 5 of series 9). Pancreas: No focal mass lesion. No dilatation of the main duct. No intraparenchymal cyst. No peripancreatic edema. Spleen: No splenomegaly. No focal mass lesion. Adrenals/Urinary Tract: Both kidneys are markedly atrophic with tiny renal cysts noted bilaterally. Stomach/Bowel: Stomach is nondistended. No gastric wall thickening. No evidence of outlet obstruction. Duodenum is normally positioned as is the ligament of Treitz. No evidence for small bowel or colonic dilatation within the visualized abdomen. Vascular/Lymphatic: No abdominal aortic aneurysm no evidence for lymphadenopathy in the abdomen. Other: No free fluid. Musculoskeletal: No abnormal marrow signal within the visualized bony anatomy. IMPRESSION: 1. Multiple tiny stones in the distal common bile duct, consistent with choledocholithiasis. 2. Loss of signal intensity in the liver parenchyma on in phase T1 weighted imaging is compatible with an iron overload syndrome. Electronically Signed   By: Misty Stanley M.D.   On: 09/06/2016 15:16   Mr 3d Recon At Scanner  Result Date: 09/06/2016 CLINICAL DATA:  Nausea with cough.  Elevated LFTs. EXAM: MRI ABDOMEN WITHOUT CONTRAST  (INCLUDING MRCP) TECHNIQUE: Multiplanar multisequence MR imaging of the abdomen was performed. Heavily T2-weighted images of the biliary and pancreatic ducts  were obtained, and three-dimensional MRCP images were rendered by post processing. COMPARISON:  None. FINDINGS: Lower chest:  Left lower lobe atelectasis. Hepatobiliary: No focal abnormality within the liver parenchyma. Lower signal intensity of the liver parenchyma on in phase phase T1 weighted imaging compared to out of phase T1 weighted imaging is compatible with iron overload. Mild intrahepatic biliary duct prominence is evident. Extrahepatic common duct measures 10 mm diameter. Common bile duct in the head of the pancreas is 8 mm diameter. Despite no definite stones in the lumen of the gallbladder. Several tiny 2-3 mm stones are identified in  the distal common bile duct (well demonstrated on coronal image 5 of series 9). Pancreas: No focal mass lesion. No dilatation of the main duct. No intraparenchymal cyst. No peripancreatic edema. Spleen: No splenomegaly. No focal mass lesion. Adrenals/Urinary Tract: Both kidneys are markedly atrophic with tiny renal cysts noted bilaterally. Stomach/Bowel: Stomach is nondistended. No gastric wall thickening. No evidence of outlet obstruction. Duodenum is normally positioned as is the ligament of Treitz. No evidence for small bowel or colonic dilatation within the visualized abdomen. Vascular/Lymphatic: No abdominal aortic aneurysm no evidence for lymphadenopathy in the abdomen. Other: No free fluid. Musculoskeletal: No abnormal marrow signal within the visualized bony anatomy. IMPRESSION: 1. Multiple tiny stones in the distal common bile duct, consistent with choledocholithiasis. 2. Loss of signal intensity in the liver parenchyma on in phase T1 weighted imaging is compatible with an iron overload syndrome. Electronically Signed   By: Misty Stanley M.D.   On: 09/06/2016 15:16   US Abdomen Limited Ruq  Result Date: 09/06/2016 CLINICAL DATA:  Elevated liver function tests. EXAM: US ABDOMEN LIMITED - RIGHT UPPER QUADRANT COMPARISON:  None. FINDINGS: Gallbladder: Gallbladder  wall is upper normal in thickness at 3 mm. No pericholecystic fluid, sonographic Murphy's sign or other secondary signs of acute cholecystitis. No gallstones seen. Common bile duct: Diameter: Common bile duct mildly distended distally measuring 8 mm diameter. CBD measures 7 mm at the porta hepatis. No bile duct stone demonstrated. Liver: Liver parenchyma is at least mildly echogenic throughout suggesting fatty infiltration. No focal liver abnormality identified. Incidental note made of a diffusely echogenic kidney, small in size, likely indicating chronic medical renal disease. IMPRESSION: 1. Mildly distended common bile duct, measuring 8 mm distally. Given the elevated liver function tests, consider MRCP for more definitive characterization. 2. Probable fatty infiltration of the liver. 3. No gallstones.  No evidence of cholecystitis. 4. Small echogenic right kidney, incompletely evaluated, suggesting chronic medical renal disease. Electronically Signed   By: Franki Cabot M.D.   On: 09/06/2016 09:09     Medications:    . allopurinol  100 mg Oral Daily  . aspirin EC  81 mg Oral Daily  . atorvastatin  40 mg Oral q1800  . clopidogrel  75 mg Oral Q breakfast  . heparin  5,000 Units Subcutaneous Q8H  . insulin aspart  0-5 Units Subcutaneous QHS  . insulin aspart  0-9 Units Subcutaneous TID WC  . levETIRAcetam  1,000 mg Oral Daily  . levETIRAcetam  250 mg Oral Q T,Th,Sa-HD  . meropenem  500 mg Intravenous Q24H  . pantoprazole  40 mg Oral Daily   ondansetron **OR** ondansetron (ZOFRAN) IV  Assessment/ Plan:  [name] is a 66 y.o. 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   TTS [facility]  1. End Stage Renal Disease with hyperkalemia: TTS schedule. Tolerating treatment well.   2. Hypertension: blood pressure elevated  during treatment. Holding BP medications.   3. Anemia of chronic kidney disease: hemoglobin 11. Hold EPO  4. Secondary Hyperparathyroidism: with hyperphosphatemia. Not currently on binders.    LOS: 3 Jeena Arnett 3/24/201810:47 AM

## 2016-09-07 NOTE — Progress Notes (Signed)
HD STARTED  

## 2016-09-07 NOTE — Progress Notes (Signed)
PRE DIALYSIS ASSESSMENT 

## 2016-09-07 NOTE — Consult Note (Signed)
Consultation  Referring Provider:     No ref. provider found Primary Care Physician:  [name], MD Primary Gastroenterologist:  None         Reason for Consultation:     Choledocholithiasis  Date of Admission:  09/04/2016 Date of Consultation:  09/07/2016         HPI:   [name] is a 66 y.o. male with PMH as listed below admitted on 3/21 with sharp upper back, right shoulder pain, fever, malaise, chills and found to have LLL pneumonia and significantly elevated LFTs with obstructive pattern. He was previously admitted on 07/23/16 with back pain, elevated LFTs, obstructive pattern and at that time US showed dilated CBD and gall bladder sludge. He was treated for PNA at that time and was discharged home as his LFTs slightly improved. Repeat US during this admission also showed dilated CBD which was confirmed on MRCP as choledocholithiasis. He now has E Coli bacteremia and was started on meropenem, switched to cipro today. GI was consulted today for choledocholithiasis. LFTs today have significantly improved including T Bili.  His symptoms significantly improved since admission, afebrile, and leukocytosis resolved. There was no evidence of gallstone pancreatitis. He is tolerating PO well. He is from [place] and does not speak english. His son was bedside who helped with history and interpretation. He denies smoking, etoh, NSAIDs.   Past Medical History:  Diagnosis Date  . Chronic diastolic congestive heart failure ([place])   . Chronic disease anemia   . ESRD (end stage renal disease) on dialysis ([place])    "[name]" (09/29/2014)  . GERD (gastroesophageal reflux disease)   . Gout   . High cholesterol   . History of blood transfusion    "related to anemia"  . History of stomach ulcers   . Hypertension   . Type II diabetes mellitus ([place])     Past Surgical History:  Procedure Laterality Date  . ARTERIOVENOUS GRAFT PLACEMENT Left ~ 1996  . CARDIAC CATHETERIZATION   09/29/2014   "[place]"  . CARDIAC CATHETERIZATION N/A 02/26/2016   Procedure: Left Heart Cath and Coronary Angiography Possible PCI;  Surgeon: [name], MD;  Location: [facility];  Service: Cardiovascular;  Laterality: N/A;  . KIDNEY TRANSPLANT Right 2004  . NEPHRECTOMY TRANSPLANTED ORGAN  2015  . PERCUTANEOUS CORONARY STENT INTERVENTION (PCI-S) N/A 09/30/2014   Procedure: PERCUTANEOUS CORONARY STENT INTERVENTION (PCI-S);  Surgeon: [name], MD;  Location: [facility] CATH [facility];  Service: Cardiovascular;  Laterality: N/A;  . PERITONEAL CATHETER INSERTION  02/2014  . PERITONEAL CATHETER REMOVAL  08/2014  . THROMBECTOMY / ARTERIOVENOUS GRAFT REVISION  2015    Prior to Admission medications   Medication Sig Start Date End Date Taking? Authorizing Provider  acetaminophen (TYLENOL) 325 MG tablet Take 2 tablets (650 mg total) by mouth every 6 (six) hours as needed for mild pain (or Fever >/= 101). 02/27/16  Yes [name], MD  albuterol (PROVENTIL) (2.5 MG/3ML) 0.083% nebulizer solution Take 2.5 mg by nebulization every 6 (six) hours as needed for wheezing or shortness of breath.   Yes Historical Provider, MD  allopurinol (ZYLOPRIM) 100 MG tablet Take 100 mg by mouth daily. 09/23/14  Yes Historical Provider, MD  aspirin 81 MG EC tablet Take 81 mg by mouth daily. 09/23/14  Yes Historical Provider, MD  atorvastatin (LIPITOR) 40 MG tablet Take 1 tablet (40 mg total) by mouth daily at 6 PM. Patient taking differently: Take 80 mg by mouth daily at  6 PM.  11/09/14  Yes [name], MD  B Complex-C (B-COMPLEX WITH VITAMIN C) tablet Take 1 tablet by mouth daily.   Yes Historical Provider, MD  clopidogrel (PLAVIX) 75 MG tablet Take 1 tablet (75 mg total) by mouth daily with breakfast. 10/02/14  Yes [name], MD  epoetin alfa (EPOGEN,PROCRIT) 4000 UNIT/ML injection Inject 4,000 Units into the vein.   Yes Historical Provider, MD  levETIRAcetam (KEPPRA) 1000 MG tablet Take 1 tablet (1,000 mg  total) by mouth daily. 07/29/16  Yes Henreitta Leber, MD  levETIRAcetam (KEPPRA) 250 MG tablet Take on the Days on Dialysis (Tues, Thurs, Sat) after Dialysis. 07/29/16  Yes Henreitta Leber, MD  meclizine (ANTIVERT) 12.5 MG tablet Take 12.5 mg by mouth 3 (three) times daily as needed. 04/22/16  Yes Historical Provider, MD  mirtazapine (REMERON) 7.5 MG tablet Take 7.5 mg by mouth at bedtime.   Yes Historical Provider, MD  nitroGLYCERIN (NITROGLYN) 2 % ointment Apply 0.5 inches topically every 6 (six) hours. 02/27/16  Yes [name], MD  nitroGLYCERIN (NITROSTAT) 0.4 MG SL tablet Place 1 tablet (0.4 mg total) under the tongue every 5 (five) minutes x 3 doses as needed for chest pain. 10/02/14  Yes [name], MD  pantoprazole (PROTONIX) 40 MG tablet Take 40 mg by mouth daily. 01/17/14  Yes Historical Provider, MD  heparin 100-0.45 UNIT/ML-% infusion Inject 1,000 Units/hr into the vein continuous. 02/27/16   [name], MD  Melatonin 3 MG TABS Take 3 mg by mouth at bedtime.    Historical Provider, MD  multivitamin (RENA-VIT) TABS tablet Take 1 tablet by mouth at bedtime. Patient not taking: Reported on 09/04/2016 10/02/14   [name], MD    Family History  Problem Relation Age of Onset  . Hypertension Mother   . Stroke Mother   . Hypertension Father   . Diabetes Mellitus II Father   . Asthma Father      Social History  Substance Use Topics  . Smoking status: Former Smoker    Types: Cigarettes  . Smokeless tobacco: Never Used     Comment: "quit smoking cigarettes in the 1980's"  . Alcohol use No    Allergies as of 09/04/2016 - Review Complete 09/04/2016  Allergen Reaction Noted  . Ivp dye [iodinated diagnostic agents] Other (See Comments) 09/29/2014    Review of Systems:    All systems reviewed and negative except where noted in HPI.   Physical Exam:  Vital signs in last 24 hours: Temp:  [98.1 F (36.7 C)-98.3 F (36.8 C)] 98.1 F (36.7 C) (03/24 1306) Pulse Rate:   [63-88] 88 (03/24 1306) Resp:  [15-33] 22 (03/24 1306) BP: (120-170)/(65-96) 130/89 (03/24 1306) SpO2:  [96 %-99 %] 99 % (03/24 1306) Weight:  [70.3 kg (154 lb 15.7 oz)-72.3 kg (159 lb 6.3 oz)] 70.3 kg (154 lb 15.7 oz) (03/24 1245) Last BM Date: 09/05/16 General:   Pleasant, cooperative in NAD Head:  Normocephalic and atraumatic. Eyes:   No icterus.   Conjunctiva pink. PERRLA. Ears:  Normal auditory acuity. Neck:  Supple; no masses or thyroidomegaly Lungs: Respirations even and unlabored. Lungs clear to auscultation bilaterally.   No wheezes, crackles, or rhonchi.  Heart:  Regular rate and rhythm;  Without murmur, clicks, rubs or gallops Abdomen:  Soft, nondistended, nontender. Normal bowel sounds. No appreciable masses or hepatomegaly.  No rebound or guarding.  Rectal:  Not performed. Msk:  Symmetrical without gross deformities. Extremities:  Without edema, cyanosis or clubbing. Neurologic:  Alert and oriented x3;  grossly normal neurologically. Skin:  Intact without significant lesions or rashes. Cervical Nodes:  No significant cervical adenopathy. Psych:  Alert and cooperative. Normal affect.  LAB RESULTS:  Recent Labs  09/04/16 1834 09/05/16 0607 09/07/16 0330  WBC 10.0 17.1* 8.0  HGB 13.4 11.5* 11.0*  HCT 42.9 36.8* 34.9*  PLT 241 223 194   BMET  Recent Labs  09/05/16 0607 09/06/16 0509 09/07/16 0330  NA 138 140 140  K 6.9* 4.8 4.5  CL 99* 98* 98*  CO2 24 29 30   GLUCOSE 142* 114* 129*  BUN 59* 28* 39*  CREATININE 11.40* 7.37* 10.00*  CALCIUM 7.5* 8.1* 8.1*   LFT  Recent Labs  09/07/16 0330  PROT 7.5  ALBUMIN 3.2*  AST 89*  ALT 151*  ALKPHOS 257*  BILITOT 1.7*   PT/INR  Recent Labs  09/04/16 2101  LABPROT 13.6  INR 1.04    STUDIES: Mr Abdomen Mrcp Wo Contrast  Result Date: 09/06/2016 CLINICAL DATA:  Nausea with cough.  Elevated LFTs. EXAM: MRI ABDOMEN WITHOUT CONTRAST  (INCLUDING MRCP) TECHNIQUE: Multiplanar multisequence MR imaging of the  abdomen was performed. Heavily T2-weighted images of the biliary and pancreatic ducts were obtained, and three-dimensional MRCP images were rendered by post processing. COMPARISON:  None. FINDINGS: Lower chest:  Left lower lobe atelectasis. Hepatobiliary: No focal abnormality within the liver parenchyma. Lower signal intensity of the liver parenchyma on in phase phase T1 weighted imaging compared to out of phase T1 weighted imaging is compatible with iron overload. Mild intrahepatic biliary duct prominence is evident. Extrahepatic common duct measures 10 mm diameter. Common bile duct in the head of the pancreas is 8 mm diameter. Despite no definite stones in the lumen of the gallbladder. Several tiny 2-3 mm stones are identified in the distal common bile duct (well demonstrated on coronal image 5 of series 9). Pancreas: No focal mass lesion. No dilatation of the main duct. No intraparenchymal cyst. No peripancreatic edema. Spleen: No splenomegaly. No focal mass lesion. Adrenals/Urinary Tract: Both kidneys are markedly atrophic with tiny renal cysts noted bilaterally. Stomach/Bowel: Stomach is nondistended. No gastric wall thickening. No evidence of outlet obstruction. Duodenum is normally positioned as is the ligament of Treitz. No evidence for small bowel or colonic dilatation within the visualized abdomen. Vascular/Lymphatic: No abdominal aortic aneurysm no evidence for lymphadenopathy in the abdomen. Other: No free fluid. Musculoskeletal: No abnormal marrow signal within the visualized bony anatomy. IMPRESSION: 1. Multiple tiny stones in the distal common bile duct, consistent with choledocholithiasis. 2. Loss of signal intensity in the liver parenchyma on in phase T1 weighted imaging is compatible with an iron overload syndrome. Electronically Signed   By: Misty Stanley M.D.   On: 09/06/2016 15:16   Mr 3d Recon At Scanner  Result Date: 09/06/2016 CLINICAL DATA:  Nausea with cough.  Elevated LFTs. EXAM: MRI  ABDOMEN WITHOUT CONTRAST  (INCLUDING MRCP) TECHNIQUE: Multiplanar multisequence MR imaging of the abdomen was performed. Heavily T2-weighted images of the biliary and pancreatic ducts were obtained, and three-dimensional MRCP images were rendered by post processing. COMPARISON:  None. FINDINGS: Lower chest:  Left lower lobe atelectasis. Hepatobiliary: No focal abnormality within the liver parenchyma. Lower signal intensity of the liver parenchyma on in phase phase T1 weighted imaging compared to out of phase T1 weighted imaging is compatible with iron overload. Mild intrahepatic biliary duct prominence is evident. Extrahepatic common duct measures 10 mm diameter. Common bile duct in the head of the pancreas  is 8 mm diameter. Despite no definite stones in the lumen of the gallbladder. Several tiny 2-3 mm stones are identified in the distal common bile duct (well demonstrated on coronal image 5 of series 9). Pancreas: No focal mass lesion. No dilatation of the main duct. No intraparenchymal cyst. No peripancreatic edema. Spleen: No splenomegaly. No focal mass lesion. Adrenals/Urinary Tract: Both kidneys are markedly atrophic with tiny renal cysts noted bilaterally. Stomach/Bowel: Stomach is nondistended. No gastric wall thickening. No evidence of outlet obstruction. Duodenum is normally positioned as is the ligament of Treitz. No evidence for small bowel or colonic dilatation within the visualized abdomen. Vascular/Lymphatic: No abdominal aortic aneurysm no evidence for lymphadenopathy in the abdomen. Other: No free fluid. Musculoskeletal: No abnormal marrow signal within the visualized bony anatomy. IMPRESSION: 1. Multiple tiny stones in the distal common bile duct, consistent with choledocholithiasis. 2. Loss of signal intensity in the liver parenchyma on in phase T1 weighted imaging is compatible with an iron overload syndrome. Electronically Signed   By: Misty Stanley M.D.   On: 09/06/2016 15:16   US Abdomen  Limited Ruq  Result Date: 09/06/2016 CLINICAL DATA:  Elevated liver function tests. EXAM: US ABDOMEN LIMITED - RIGHT UPPER QUADRANT COMPARISON:  None. FINDINGS: Gallbladder: Gallbladder wall is upper normal in thickness at 3 mm. No pericholecystic fluid, sonographic Murphy's sign or other secondary signs of acute cholecystitis. No gallstones seen. Common bile duct: Diameter: Common bile duct mildly distended distally measuring 8 mm diameter. CBD measures 7 mm at the porta hepatis. No bile duct stone demonstrated. Liver: Liver parenchyma is at least mildly echogenic throughout suggesting fatty infiltration. No focal liver abnormality identified. Incidental note made of a diffusely echogenic kidney, small in size, likely indicating chronic medical renal disease. IMPRESSION: 1. Mildly distended common bile duct, measuring 8 mm distally. Given the elevated liver function tests, consider MRCP for more definitive characterization. 2. Probable fatty infiltration of the liver. 3. No gallstones.  No evidence of cholecystitis. 4. Small echogenic right kidney, incompletely evaluated, suggesting chronic medical renal disease. Electronically Signed   By: Franki Cabot M.D.   On: 09/06/2016 09:09      Impression / Plan:   [name] is a 66 y.o. y/o male with ESRD, CHF, HTN, HLD admitted with ascending cholangitis in the setting of choledocholithiasis. Clinically stable at present. No urgent/emergent need for ERCP.  - Continue Cipro to treat E Coli bacteremia - ERCP Monday by Dr Allen Norris for biliary sphincterotomy/stone extraction - Recommend general surgery consult for cholecystectomy after ERCP - clear liquid diet only - Monitor daily LFTs and closely monitor for any clinical worsening  Thank you for involving me in the care of this patient.      LOS: 3 days   Sherri Sear, MD  09/07/2016, 1:32 PM

## 2016-09-07 NOTE — Progress Notes (Addendum)
[place] at [place] NAME: [name]    MR#:  [id number]  DATE OF BIRTH:  01-20-1951  SUBJECTIVE:   Patient without abdominal pain this am  No fevers overnight  REVIEW OF SYSTEMS:    Review of Systems  Constitutional: Negative for chills, fever and malaise/fatigue.  HENT: Negative.  Negative for ear discharge, ear pain, hearing loss, nosebleeds and sore throat.   Eyes: Negative.  Negative for blurred vision and pain.  Respiratory: Negative.  Negative for cough, hemoptysis, shortness of breath and wheezing.   Cardiovascular: Negative.  Negative for chest pain, palpitations and leg swelling.  Gastrointestinal: Negative for abdominal pain, blood in stool, diarrhea, nausea and vomiting.  Genitourinary: Negative.  Negative for dysuria.  Musculoskeletal: Negative.  Negative for back pain.  Skin: Negative.   Neurological: Positive for weakness (better). Negative for dizziness, tremors, speech change, focal weakness, seizures and headaches.  Endo/Heme/Allergies: Negative.  Does not bruise/bleed easily.  Psychiatric/Behavioral: Negative.  Negative for depression, hallucinations and suicidal ideas.    Tolerating Diet: yes      DRUG ALLERGIES:   Allergies  Allergen Reactions  . Ivp Dye [Iodinated Diagnostic Agents] Other (See Comments)    Pt denied    VITALS:  Blood pressure (!) 161/96, pulse 72, temperature 98.3 F (36.8 C), temperature source Oral, resp. rate 17, height 4\' 9"  (1.448 m), weight 72.3 kg (159 lb 6.3 oz), SpO2 99 %.  PHYSICAL EXAMINATION:   Physical Exam  Constitutional: He is oriented to person, place, and time and well-developed, well-nourished, and in no distress. No distress.  HENT:  Head: Normocephalic.  Eyes: No scleral icterus.  Neck: Normal range of motion. Neck supple. No JVD present. No tracheal deviation present.  Cardiovascular: Normal rate, regular rhythm and normal heart sounds.  Exam reveals no gallop  and no friction rub.   No murmur heard. Pulmonary/Chest: Effort normal and breath sounds normal. No respiratory distress. He has no wheezes. He has no rales. He exhibits no tenderness.  Abdominal: Soft. Bowel sounds are normal. He exhibits no distension and no mass. There is no tenderness. There is no rebound and no guarding.  Musculoskeletal: Normal range of motion. He exhibits no edema.  Neurological: He is alert and oriented to person, place, and time.  Skin: Skin is warm. No rash noted. No erythema.  Psychiatric: Affect and judgment normal.      LABORATORY PANEL:   CBC  Recent Labs Lab 09/07/16 0330  WBC 8.0  HGB 11.0*  HCT 34.9*  PLT 194   ------------------------------------------------------------------------------------------------------------------  Chemistries   Recent Labs Lab 09/07/16 0330  NA 140  K 4.5  CL 98*  CO2 30  GLUCOSE 129*  BUN 39*  CREATININE 10.00*  CALCIUM 8.1*  AST 89*  ALT 151*  ALKPHOS 257*  BILITOT 1.7*   ------------------------------------------------------------------------------------------------------------------  Cardiac Enzymes  Recent Labs Lab 09/04/16 1834 09/05/16 0104 09/05/16 0607  TROPONINI 0.03* 0.03* 0.03*   ------------------------------------------------------------------------------------------------------------------  RADIOLOGY:  Mr Abdomen Mrcp Wo Contrast  Result Date: 09/06/2016 CLINICAL DATA:  Nausea with cough.  Elevated LFTs. EXAM: MRI ABDOMEN WITHOUT CONTRAST  (INCLUDING MRCP) TECHNIQUE: Multiplanar multisequence MR imaging of the abdomen was performed. Heavily T2-weighted images of the biliary and pancreatic ducts were obtained, and three-dimensional MRCP images were rendered by post processing. COMPARISON:  None. FINDINGS: Lower chest:  Left lower lobe atelectasis. Hepatobiliary: No focal abnormality within the liver parenchyma. Lower signal intensity of the liver parenchyma on in phase phase  T1  weighted imaging compared to out of phase T1 weighted imaging is compatible with iron overload. Mild intrahepatic biliary duct prominence is evident. Extrahepatic common duct measures 10 mm diameter. Common bile duct in the head of the pancreas is 8 mm diameter. Despite no definite stones in the lumen of the gallbladder. Several tiny 2-3 mm stones are identified in the distal common bile duct (well demonstrated on coronal image 5 of series 9). Pancreas: No focal mass lesion. No dilatation of the main duct. No intraparenchymal cyst. No peripancreatic edema. Spleen: No splenomegaly. No focal mass lesion. Adrenals/Urinary Tract: Both kidneys are markedly atrophic with tiny renal cysts noted bilaterally. Stomach/Bowel: Stomach is nondistended. No gastric wall thickening. No evidence of outlet obstruction. Duodenum is normally positioned as is the ligament of Treitz. No evidence for small bowel or colonic dilatation within the visualized abdomen. Vascular/Lymphatic: No abdominal aortic aneurysm no evidence for lymphadenopathy in the abdomen. Other: No free fluid. Musculoskeletal: No abnormal marrow signal within the visualized bony anatomy. IMPRESSION: 1. Multiple tiny stones in the distal common bile duct, consistent with choledocholithiasis. 2. Loss of signal intensity in the liver parenchyma on in phase T1 weighted imaging is compatible with an iron overload syndrome. Electronically Signed   By: Misty Stanley M.D.   On: 09/06/2016 15:16   Mr 3d Recon At Scanner  Result Date: 09/06/2016 CLINICAL DATA:  Nausea with cough.  Elevated LFTs. EXAM: MRI ABDOMEN WITHOUT CONTRAST  (INCLUDING MRCP) TECHNIQUE: Multiplanar multisequence MR imaging of the abdomen was performed. Heavily T2-weighted images of the biliary and pancreatic ducts were obtained, and three-dimensional MRCP images were rendered by post processing. COMPARISON:  None. FINDINGS: Lower chest:  Left lower lobe atelectasis. Hepatobiliary: No focal abnormality  within the liver parenchyma. Lower signal intensity of the liver parenchyma on in phase phase T1 weighted imaging compared to out of phase T1 weighted imaging is compatible with iron overload. Mild intrahepatic biliary duct prominence is evident. Extrahepatic common duct measures 10 mm diameter. Common bile duct in the head of the pancreas is 8 mm diameter. Despite no definite stones in the lumen of the gallbladder. Several tiny 2-3 mm stones are identified in the distal common bile duct (well demonstrated on coronal image 5 of series 9). Pancreas: No focal mass lesion. No dilatation of the main duct. No intraparenchymal cyst. No peripancreatic edema. Spleen: No splenomegaly. No focal mass lesion. Adrenals/Urinary Tract: Both kidneys are markedly atrophic with tiny renal cysts noted bilaterally. Stomach/Bowel: Stomach is nondistended. No gastric wall thickening. No evidence of outlet obstruction. Duodenum is normally positioned as is the ligament of Treitz. No evidence for small bowel or colonic dilatation within the visualized abdomen. Vascular/Lymphatic: No abdominal aortic aneurysm no evidence for lymphadenopathy in the abdomen. Other: No free fluid. Musculoskeletal: No abnormal marrow signal within the visualized bony anatomy. IMPRESSION: 1. Multiple tiny stones in the distal common bile duct, consistent with choledocholithiasis. 2. Loss of signal intensity in the liver parenchyma on in phase T1 weighted imaging is compatible with an iron overload syndrome. Electronically Signed   By: Misty Stanley M.D.   On: 09/06/2016 15:16   US Abdomen Limited Ruq  Result Date: 09/06/2016 CLINICAL DATA:  Elevated liver function tests. EXAM: US ABDOMEN LIMITED - RIGHT UPPER QUADRANT COMPARISON:  None. FINDINGS: Gallbladder: Gallbladder wall is upper normal in thickness at 3 mm. No pericholecystic fluid, sonographic Murphy's sign or other secondary signs of acute cholecystitis. No gallstones seen. Common bile duct:  Diameter: Common bile duct  mildly distended distally measuring 8 mm diameter. CBD measures 7 mm at the porta hepatis. No bile duct stone demonstrated. Liver: Liver parenchyma is at least mildly echogenic throughout suggesting fatty infiltration. No focal liver abnormality identified. Incidental note made of a diffusely echogenic kidney, small in size, likely indicating chronic medical renal disease. IMPRESSION: 1. Mildly distended common bile duct, measuring 8 mm distally. Given the elevated liver function tests, consider MRCP for more definitive characterization. 2. Probable fatty infiltration of the liver. 3. No gallstones.  No evidence of cholecystitis. 4. Small echogenic right kidney, incompletely evaluated, suggesting chronic medical renal disease. Electronically Signed   By: Franki Cabot M.D.   On: 09/06/2016 09:09     ASSESSMENT AND PLAN:   66 year old male with ESRD on hemodialysis, diabetes and essential hypertension who presents with sepsis.  1. Escherichia coli Sepsis present on admission: Patient presented with tachycardia, tachypnea and leukocytosis Sepsis from pneumonia and Escherichia coli bacteremia from Probable choledocholithiasis Appreciate ID consult Can change to oral ciprofloxacin as per sensitivities needs 14 days of antibiotics total.   2. Cholangitis with Elevated LFTs with chronic elevation in LFTs: LFTs are improving MRCP shows choledocholithiasis  GI consult for ERCP Will need surgical evaluation afterward.  3. ESRD on hemodialysis: Continue dialysis as per nephrology   4. Hyperkalemia: Treated with insulin and dextrose and hemodialysis This is resolved   4. Diabetes: Continue sliding scale insulin 5. Chronic diastolic heart failure : no evidence of exacerbation    Management plans discussed with the patient and he is in agreement.  CODE STATUS: full  TOTAL TIME TAKING CARE OF THIS PATIENT: 24 minutes.   Left message with daughter and son  POSSIBLE  D/C 2 days, DEPENDING ON CLINICAL CONDITION.   Ivionna Verley M.D on 09/07/2016 at 10:39 AM  Between 7am to 6pm - Pager - [phone] After 6pm go to [email or web address] - password [email or web address] [facility]  Office  [phone]  CC: Primary care physician; [name], MD  Note: This dictation was prepared with Dragon dictation along with smaller phrase technology. Any transcriptional errors that result from this process are unintentional.

## 2016-09-07 NOTE — Progress Notes (Addendum)
Pharmacy Antibiotic Note  Benzion Mesta is a 66 y.o. male admitted on 09/04/2016 with pneumonia and E.Coli bacteremia. Patient on meropenem and cefepime. Culture results showing sensitivities to ciprofloxacin. Pharmacy  consulted for Cipro dosing.  Plan: Will start Ciprofloxacin 500mg  Daily, to be given after dialysis on dialysis days.   Height: 4\' 9"  (144.8 cm) Weight: 159 lb 6.3 oz (72.3 kg) IBW/kg (Calculated) : 43.1  Temp (24hrs), Avg:98.2 F (36.8 C), Min:98.1 F (36.7 C), Max:98.3 F (36.8 C)   Recent Labs Lab 09/04/16 1834 09/04/16 2101 09/05/16 0104 09/05/16 0607 09/06/16 0509 09/06/16 1152 09/07/16 0330  WBC 10.0  --   --  17.1*  --   --  8.0  CREATININE 10.77*  --   --  11.40* 7.37*  --  10.00*  LATICACIDVEN 4.5* 5.0* 5.7*  --  0.9  --   --   VANCORANDOM  --   --   --   --   --  18  --     Estimated Creatinine Clearance: 5.7 mL/min (A) (by C-G formula based on SCr of 10 mg/dL (H)).    Allergies  Allergen Reactions  . Ivp Dye [Iodinated Diagnostic Agents] Other (See Comments)    Pt denied    Antimicrobials this admission: Cefepime:  3/22 >> Meropenem 3/23 >>  Dose adjustments this admission:  Microbiology results: 3/21 BCx: Ecoli 3/21 MRSA PCR: negative   Pernell Dupre, PharmD, BCPS Clinical Pharmacist 09/07/2016 11:00 AM

## 2016-09-08 LAB — COMPREHENSIVE METABOLIC PANEL
ALK PHOS: 238 U/L — AB (ref 38–126)
ALT: 113 U/L — ABNORMAL HIGH (ref 17–63)
ANION GAP: 16 — AB (ref 5–15)
AST: 55 U/L — AB (ref 15–41)
Albumin: 3.5 g/dL (ref 3.5–5.0)
BILIRUBIN TOTAL: 1.9 mg/dL — AB (ref 0.3–1.2)
BUN: 26 mg/dL — AB (ref 6–20)
CALCIUM: 8.8 mg/dL — AB (ref 8.9–10.3)
CO2: 28 mmol/L (ref 22–32)
Chloride: 97 mmol/L — ABNORMAL LOW (ref 101–111)
Creatinine, Ser: 8.07 mg/dL — ABNORMAL HIGH (ref 0.61–1.24)
GFR calc Af Amer: 7 mL/min — ABNORMAL LOW (ref >60)
GFR, EST NON AFRICAN AMERICAN: 6 mL/min — AB (ref >60)
Glucose, Bld: 85 mg/dL (ref 65–99)
Potassium: 5.3 mmol/L — ABNORMAL HIGH (ref 3.5–5.1)
Sodium: 141 mmol/L (ref 135–145)
TOTAL PROTEIN: 8.2 g/dL — AB (ref 6.5–8.1)

## 2016-09-08 LAB — CBC
HCT: 38.5 % — ABNORMAL LOW (ref 40.0–52.0)
Hemoglobin: 12.1 g/dL — ABNORMAL LOW (ref 13.0–18.0)
MCH: 23.6 pg — ABNORMAL LOW (ref 26.0–34.0)
MCHC: 31.5 g/dL — AB (ref 32.0–36.0)
MCV: 74.9 fL — ABNORMAL LOW (ref 80.0–100.0)
Platelets: 222 10*3/uL (ref 150–440)
RBC: 5.14 MIL/uL (ref 4.40–5.90)
RDW: 18.9 % — AB (ref 11.5–14.5)
WBC: 6.8 10*3/uL (ref 3.8–10.6)

## 2016-09-08 LAB — GLUCOSE, CAPILLARY
GLUCOSE-CAPILLARY: 92 mg/dL (ref 65–99)
GLUCOSE-CAPILLARY: 76 mg/dL (ref 65–99)
GLUCOSE-CAPILLARY: 161 mg/dL — AB (ref 65–99)
Glucose-Capillary: 123 mg/dL — ABNORMAL HIGH (ref 65–99)

## 2016-09-08 NOTE — Progress Notes (Signed)
[facility] NAME: [name]    MR#:  [id number]  DATE OF BIRTH:  [birth year]  SUBJECTIVE:   Patient hungry this am Going for ERCP in am  REVIEW OF SYSTEMS:    Review of Systems  Constitutional: Negative for chills, fever and malaise/fatigue.  HENT: Negative.  Negative for ear discharge, ear pain, hearing loss, nosebleeds and sore throat.   Eyes: Negative.  Negative for blurred vision and pain.  Respiratory: Negative.  Negative for cough, hemoptysis, shortness of breath and wheezing.   Cardiovascular: Negative.  Negative for chest pain, palpitations and leg swelling.  Gastrointestinal: Negative for abdominal pain, blood in stool, diarrhea, nausea and vomiting.  Genitourinary: Negative.  Negative for dysuria.  Musculoskeletal: Negative.  Negative for back pain.  Skin: Negative.   Neurological: Negative for dizziness, tremors, speech change, focal weakness, seizures, weakness (better) and headaches.  Endo/Heme/Allergies: Negative.  Does not bruise/bleed easily.  Psychiatric/Behavioral: Negative.  Negative for depression, hallucinations and suicidal ideas.    Tolerating Diet: yes      DRUG ALLERGIES:   Allergies  Allergen Reactions  . Ivp Dye [Iodinated Diagnostic Agents] Other (See Comments)    Pt denied    VITALS:  Blood pressure (!) 151/75, pulse 70, temperature 98 F (36.7 C), temperature source Oral, resp. rate 14, height 4\' 9"  (1.448 m), weight 70.3 kg (154 lb 15.7 oz), SpO2 97 %.  PHYSICAL EXAMINATION:   Physical Exam  Constitutional: He is oriented to person, place, and time and well-developed, well-nourished, and in no distress. No distress.  HENT:  Head: Normocephalic.  Eyes: No scleral icterus.  Neck: Normal range of motion. Neck supple. No JVD present. No tracheal deviation present.  Cardiovascular: Normal rate, regular rhythm and normal heart sounds.  Exam reveals no gallop and no friction rub.   No  murmur heard. Pulmonary/Chest: Effort normal and breath sounds normal. No respiratory distress. He has no wheezes. He has no rales. He exhibits no tenderness.  Abdominal: Soft. Bowel sounds are normal. He exhibits no distension and no mass. There is no tenderness. There is no rebound and no guarding.  Musculoskeletal: Normal range of motion. He exhibits no edema.  Neurological: He is alert and oriented to person, place, and time.  Skin: Skin is warm. No rash noted. No erythema.  Psychiatric: Affect and judgment normal.      LABORATORY PANEL:   CBC  Recent Labs Lab 09/08/16 0751  WBC 6.8  HGB 12.1*  HCT 38.5*  PLT 222   ------------------------------------------------------------------------------------------------------------------  Chemistries   Recent Labs Lab 09/08/16 0751  NA 141  K 5.3*  CL 97*  CO2 28  GLUCOSE 85  BUN 26*  CREATININE 8.07*  CALCIUM 8.8*  AST 55*  ALT 113*  ALKPHOS 238*  BILITOT 1.9*   ------------------------------------------------------------------------------------------------------------------  Cardiac Enzymes  Recent Labs Lab 09/04/16 1834 09/05/16 0104 09/05/16 0607  TROPONINI 0.03* 0.03* 0.03*   ------------------------------------------------------------------------------------------------------------------  RADIOLOGY:  Mr Abdomen Mrcp Wo Contrast  Result Date: 09/06/2016 CLINICAL DATA:  Nausea with cough.  Elevated LFTs. EXAM: MRI ABDOMEN WITHOUT CONTRAST  (INCLUDING MRCP) TECHNIQUE: Multiplanar multisequence MR imaging of the abdomen was performed. Heavily T2-weighted images of the biliary and pancreatic ducts were obtained, and three-dimensional MRCP images were rendered by post processing. COMPARISON:  None. FINDINGS: Lower chest:  Left lower lobe atelectasis. Hepatobiliary: No focal abnormality within the liver parenchyma. Lower signal intensity of the liver parenchyma on in phase phase T1 weighted imaging  compared to out  of phase T1 weighted imaging is compatible with iron overload. Mild intrahepatic biliary duct prominence is evident. Extrahepatic common duct measures 10 mm diameter. Common bile duct in the head of the pancreas is 8 mm diameter. Despite no definite stones in the lumen of the gallbladder. Several tiny 2-3 mm stones are identified in the distal common bile duct (well demonstrated on coronal image 5 of series 9). Pancreas: No focal mass lesion. No dilatation of the main duct. No intraparenchymal cyst. No peripancreatic edema. Spleen: No splenomegaly. No focal mass lesion. Adrenals/Urinary Tract: Both kidneys are markedly atrophic with tiny renal cysts noted bilaterally. Stomach/Bowel: Stomach is nondistended. No gastric wall thickening. No evidence of outlet obstruction. Duodenum is normally positioned as is the ligament of Treitz. No evidence for small bowel or colonic dilatation within the visualized abdomen. Vascular/Lymphatic: No abdominal aortic aneurysm no evidence for lymphadenopathy in the abdomen. Other: No free fluid. Musculoskeletal: No abnormal marrow signal within the visualized bony anatomy. IMPRESSION: 1. Multiple tiny stones in the distal common bile duct, consistent with choledocholithiasis. 2. Loss of signal intensity in the liver parenchyma on in phase T1 weighted imaging is compatible with an iron overload syndrome. Electronically Signed   By: Misty Stanley M.D.   On: 09/06/2016 15:16   Mr 3d Recon At Scanner  Result Date: 09/06/2016 CLINICAL DATA:  Nausea with cough.  Elevated LFTs. EXAM: MRI ABDOMEN WITHOUT CONTRAST  (INCLUDING MRCP) TECHNIQUE: Multiplanar multisequence MR imaging of the abdomen was performed. Heavily T2-weighted images of the biliary and pancreatic ducts were obtained, and three-dimensional MRCP images were rendered by post processing. COMPARISON:  None. FINDINGS: Lower chest:  Left lower lobe atelectasis. Hepatobiliary: No focal abnormality within the liver parenchyma.  Lower signal intensity of the liver parenchyma on in phase phase T1 weighted imaging compared to out of phase T1 weighted imaging is compatible with iron overload. Mild intrahepatic biliary duct prominence is evident. Extrahepatic common duct measures 10 mm diameter. Common bile duct in the head of the pancreas is 8 mm diameter. Despite no definite stones in the lumen of the gallbladder. Several tiny 2-3 mm stones are identified in the distal common bile duct (well demonstrated on coronal image 5 of series 9). Pancreas: No focal mass lesion. No dilatation of the main duct. No intraparenchymal cyst. No peripancreatic edema. Spleen: No splenomegaly. No focal mass lesion. Adrenals/Urinary Tract: Both kidneys are markedly atrophic with tiny renal cysts noted bilaterally. Stomach/Bowel: Stomach is nondistended. No gastric wall thickening. No evidence of outlet obstruction. Duodenum is normally positioned as is the ligament of Treitz. No evidence for small bowel or colonic dilatation within the visualized abdomen. Vascular/Lymphatic: No abdominal aortic aneurysm no evidence for lymphadenopathy in the abdomen. Other: No free fluid. Musculoskeletal: No abnormal marrow signal within the visualized bony anatomy. IMPRESSION: 1. Multiple tiny stones in the distal common bile duct, consistent with choledocholithiasis. 2. Loss of signal intensity in the liver parenchyma on in phase T1 weighted imaging is compatible with an iron overload syndrome. Electronically Signed   By: Misty Stanley M.D.   On: 09/06/2016 15:16     ASSESSMENT AND PLAN:   66 year old male with ESRD on hemodialysis, diabetes and essential hypertension who presents with sepsis.  1. Escherichia coli Sepsis present on admission: Patient presented with tachycardia, tachypnea and leukocytosis Sepsis from pneumonia and Escherichia coli bacteremia from Probable choledocholithiasis Appreciate ID consult Continue oral ciprofloxacin as per sensitivities needs  14 days of antibiotics total [place]ane Dee.  2. Cholangitis with Elevated LFTs with chronic elevation in LFTs: LFTs have improved  MRCP shows choledocholithiasis  Patient will have ERCP in a.m. and then will need cholecystectomy  appreciate GI and surgery consultation    3. ESRD on hemodialysis: Continue dialysis as per nephrology   4. Hyperkalemia: Treated with insulin and dextrose and hemodialysis This has  resolved   4. Diabetes: Continue sliding scale insulin 5. Chronic diastolic heart failure : no evidence of exacerbation   Management plans discussed with the patient and he is in agreement.  CODE STATUS: full  TOTAL TIME TAKING CARE OF THIS PATIENT: 23 minutes.     POSSIBLE D/C 2-3  days, DEPENDING ON CLINICAL CONDITION.   Alfio Loescher M.D on 09/08/2016 at 10:40 AM  Between 7am to 6pm - Pager - [phone] After 6pm go to [email or web address] - password [email or web address] [facility]  Office  [phone]  CC: Primary care physician; [name], MD  Note: This dictation was prepared with Dragon dictation along with smaller phrase technology. Any transcriptional errors that result from this process are unintentional.

## 2016-09-08 NOTE — Progress Notes (Signed)
[facility]  ROUNDING NOTE   Subjective:   Hemodialysis yesterday. Well tolerated. UF of 2 litres.  Complains of hunger. Denies abdominal pain.   Objective:  Vital signs in last 24 hours:  Temp:  [98 F (36.7 C)-98.1 F (36.7 C)] 98 F (36.7 C) (03/25 0634) Pulse Rate:  [63-88] 70 (03/25 0634) Resp:  [14-33] 14 (03/25 0634) BP: (128-161)/(75-96) 151/75 (03/25 0634) SpO2:  [97 %-99 %] 97 % (03/25 0634) Weight:  [70.3 kg (154 lb 15.7 oz)] 70.3 kg (154 lb 15.7 oz) (03/24 1245)  Weight change: 0.7 kg (1 lb 8.7 oz) Filed Weights   09/07/16 0500 09/07/16 0945 09/07/16 1245  Weight: 71.6 kg (157 lb 13.6 oz) 72.3 kg (159 lb 6.3 oz) 70.3 kg (154 lb 15.7 oz)    Intake/Output: I/O last 3 completed shifts: In: 120 [P.O.:120] Out: 2000 [Other:2000]   Intake/Output this shift:  No intake/output data recorded.  Physical Exam: General: NAD,   Head: Normocephalic, atraumatic. Moist oral mucosal membranes  Eyes: Anicteric, PERRL  Neck: Supple, trachea midline  Lungs:  Clear to auscultation  Heart: Regular rate and rhythm  Abdomen:  Soft, nontender,   Extremities: no peripheral edema.  Neurologic: Nonfocal, moving all four extremities  Skin: No lesions  Access: Left AVF    Basic Metabolic Panel:  Recent Labs Lab 09/04/16 1834 09/05/16 0607 09/06/16 0509 09/07/16 0330 09/08/16 0751  NA 137 138 140 140 141  K 4.1 6.9* 4.8 4.5 5.3*  CL 93* 99* 98* 98* 97*  CO2 26 24 29 30 28   GLUCOSE 210* 142* 114* 129* 85  BUN 51* 59* 28* 39* 26*  CREATININE 10.77* 11.40* 7.37* 10.00* 8.07*  CALCIUM 8.4* 7.5* 8.1* 8.1* 8.8*    Liver Function Tests:  Recent Labs Lab 09/04/16 1834 09/06/16 0509 09/07/16 0330 09/08/16 0751  AST 841* 160* 89* 55*  ALT 456* 208* 151* 113*  ALKPHOS 336* 257* 257* 238*  BILITOT 3.3* 3.1* 1.7* 1.9*  PROT 8.9* 7.1 7.5 8.2*  ALBUMIN 4.1 3.2* 3.2* 3.5    Recent Labs Lab 09/04/16 1834  LIPASE 50    Recent Labs Lab 09/04/16 1834   AMMONIA 34    CBC:  Recent Labs Lab 09/04/16 1834 09/05/16 0607 09/07/16 0330 09/08/16 0751  WBC 10.0 17.1* 8.0 6.8  NEUTROABS 8.9*  --   --   --   HGB 13.4 11.5* 11.0* 12.1*  HCT 42.9 36.8* 34.9* 38.5*  MCV 74.7* 75.1* 75.0* 74.9*  PLT 241 223 194 222    Cardiac Enzymes:  Recent Labs Lab 09/04/16 1834 09/05/16 0104 09/05/16 0607  TROPONINI 0.03* 0.03* 0.03*    BNP: Invalid input(s): POCBNP  CBG:  Recent Labs Lab 09/07/16 0726 09/07/16 1303 09/07/16 1620 09/07/16 2110 09/08/16 0749  GLUCAP 95 105* 107* 106* 92    Microbiology: Results for orders placed or performed during the hospital encounter of 09/04/16  Blood culture (routine x 2)     Status: Abnormal   Collection Time: 09/04/16  9:01 PM  Result Value Ref Range Status   Specimen Description BLOOD RIGHT FOREARM  Final   Special Requests BOTTLES DRAWN AEROBIC AND ANAEROBIC  BCHV  Final   Culture  Setup Time   Final    GRAM NEGATIVE RODS IN BOTH AEROBIC AND ANAEROBIC BOTTLES CRITICAL RESULT CALLED TO, READ BACK BY AND VERIFIED WITH: HANK ZOMPA 09/05/16 1128 SGD    Culture (A)  Final    ESCHERICHIA COLI SUSCEPTIBILITIES PERFORMED ON PREVIOUS CULTURE WITHIN THE  LAST 5 DAYS. Performed at [facility], [place] [address], [place], [place] [address]    Report Status 09/07/2016 FINAL  Final  Blood culture (routine x 2)     Status: Abnormal   Collection Time: 09/04/16  9:01 PM  Result Value Ref Range Status   Specimen Description BLOOD RIGHT ANTECUBITAL  Final   Special Requests BOTTLES DRAWN AEROBIC AND ANAEROBIC  BCHV  Final   Culture  Setup Time   Final    GRAM NEGATIVE RODS IN BOTH AEROBIC AND ANAEROBIC BOTTLES CRITICAL RESULT CALLED TO, READ BACK BY AND VERIFIED WITH: HANK ZOMPA 09/05/16 1128 SGD Performed at [facility], [place] [address], [place], [place] [address]    Culture ESCHERICHIA COLI (A)  Final   Report Status 09/07/2016 FINAL  Final   Organism ID, Bacteria ESCHERICHIA COLI   Final      Susceptibility   Escherichia coli - MIC*    AMPICILLIN <=2 SENSITIVE Sensitive     CEFAZOLIN <=4 SENSITIVE Sensitive     CEFEPIME <=1 SENSITIVE Sensitive     CEFTAZIDIME <=1 SENSITIVE Sensitive     CEFTRIAXONE <=1 SENSITIVE Sensitive     CIPROFLOXACIN <=0.25 SENSITIVE Sensitive     GENTAMICIN <=1 SENSITIVE Sensitive     IMIPENEM <=0.25 SENSITIVE Sensitive     TRIMETH/SULFA <=20 SENSITIVE Sensitive     AMPICILLIN/SULBACTAM <=2 SENSITIVE Sensitive     PIP/TAZO <=4 SENSITIVE Sensitive     Extended ESBL NEGATIVE Sensitive     * ESCHERICHIA COLI  Blood Culture ID Panel (Reflexed)     Status: Abnormal   Collection Time: 09/04/16  9:01 PM  Result Value Ref Range Status   Enterococcus species NOT DETECTED NOT DETECTED Final   Listeria monocytogenes NOT DETECTED NOT DETECTED Final   Staphylococcus species NOT DETECTED NOT DETECTED Final   Staphylococcus aureus NOT DETECTED NOT DETECTED Final   Streptococcus species NOT DETECTED NOT DETECTED Final   Streptococcus agalactiae NOT DETECTED NOT DETECTED Final   Streptococcus pneumoniae NOT DETECTED NOT DETECTED Final   Streptococcus pyogenes NOT DETECTED NOT DETECTED Final   Acinetobacter baumannii NOT DETECTED NOT DETECTED Final   Enterobacteriaceae species DETECTED (A) NOT DETECTED Final    Comment: Enterobacteriaceae represent a large family of gram-negative bacteria, not a single organism. CRITICAL RESULT CALLED TO, READ BACK BY AND VERIFIED WITH: HANK ZOMPA 09/05/16 1128 SGD    Enterobacter cloacae complex NOT DETECTED NOT DETECTED Final   Escherichia coli DETECTED (A) NOT DETECTED Final    Comment: CRITICAL RESULT CALLED TO, READ BACK BY AND VERIFIED WITH: HANK ZOMPA 09/05/16 1128 SGD    Klebsiella oxytoca NOT DETECTED NOT DETECTED Final   Klebsiella pneumoniae NOT DETECTED NOT DETECTED Final   Proteus species NOT DETECTED NOT DETECTED Final   Serratia marcescens NOT DETECTED NOT DETECTED Final   Carbapenem resistance NOT  DETECTED NOT DETECTED Final   Haemophilus influenzae NOT DETECTED NOT DETECTED Final   Neisseria meningitidis NOT DETECTED NOT DETECTED Final   Pseudomonas aeruginosa NOT DETECTED NOT DETECTED Final   Candida albicans NOT DETECTED NOT DETECTED Final   Candida glabrata NOT DETECTED NOT DETECTED Final   Candida krusei NOT DETECTED NOT DETECTED Final   Candida parapsilosis NOT DETECTED NOT DETECTED Final   Candida tropicalis NOT DETECTED NOT DETECTED Final  MRSA PCR Screening     Status: None   Collection Time: 09/04/16 11:14 PM  Result Value Ref Range Status   MRSA by PCR NEGATIVE NEGATIVE Final  Comment:        The GeneXpert MRSA Assay (FDA approved for NASAL specimens only), is one component of a comprehensive MRSA colonization surveillance program. It is not intended to diagnose MRSA infection nor to guide or monitor treatment for MRSA infections.     Coagulation Studies: No results for input(s): LABPROT, INR in the last 72 hours.  Urinalysis: No results for input(s): COLORURINE, LABSPEC, PHURINE, GLUCOSEU, HGBUR, BILIRUBINUR, KETONESUR, PROTEINUR, UROBILINOGEN, NITRITE, LEUKOCYTESUR in the last 72 hours.  Invalid input(s): APPERANCEUR    Imaging: Mr Abdomen Mrcp Wo Contrast  Result Date: 09/06/2016 CLINICAL DATA:  Nausea with cough.  Elevated LFTs. EXAM: MRI ABDOMEN WITHOUT CONTRAST  (INCLUDING MRCP) TECHNIQUE: Multiplanar multisequence MR imaging of the abdomen was performed. Heavily T2-weighted images of the biliary and pancreatic ducts were obtained, and three-dimensional MRCP images were rendered by post processing. COMPARISON:  None. FINDINGS: Lower chest:  Left lower lobe atelectasis. Hepatobiliary: No focal abnormality within the liver parenchyma. Lower signal intensity of the liver parenchyma on in phase phase T1 weighted imaging compared to out of phase T1 weighted imaging is compatible with iron overload. Mild intrahepatic biliary duct prominence is evident.  Extrahepatic common duct measures 10 mm diameter. Common bile duct in the head of the pancreas is 8 mm diameter. Despite no definite stones in the lumen of the gallbladder. Several tiny 2-3 mm stones are identified in the distal common bile duct (well demonstrated on coronal image 5 of series 9). Pancreas: No focal mass lesion. No dilatation of the main duct. No intraparenchymal cyst. No peripancreatic edema. Spleen: No splenomegaly. No focal mass lesion. Adrenals/Urinary Tract: Both kidneys are markedly atrophic with tiny renal cysts noted bilaterally. Stomach/Bowel: Stomach is nondistended. No gastric wall thickening. No evidence of outlet obstruction. Duodenum is normally positioned as is the ligament of Treitz. No evidence for small bowel or colonic dilatation within the visualized abdomen. Vascular/Lymphatic: No abdominal aortic aneurysm no evidence for lymphadenopathy in the abdomen. Other: No free fluid. Musculoskeletal: No abnormal marrow signal within the visualized bony anatomy. IMPRESSION: 1. Multiple tiny stones in the distal common bile duct, consistent with choledocholithiasis. 2. Loss of signal intensity in the liver parenchyma on in phase T1 weighted imaging is compatible with an iron overload syndrome. Electronically Signed   By: Misty Stanley M.D.   On: 09/06/2016 15:16   Mr 3d Recon At Scanner  Result Date: 09/06/2016 CLINICAL DATA:  Nausea with cough.  Elevated LFTs. EXAM: MRI ABDOMEN WITHOUT CONTRAST  (INCLUDING MRCP) TECHNIQUE: Multiplanar multisequence MR imaging of the abdomen was performed. Heavily T2-weighted images of the biliary and pancreatic ducts were obtained, and three-dimensional MRCP images were rendered by post processing. COMPARISON:  None. FINDINGS: Lower chest:  Left lower lobe atelectasis. Hepatobiliary: No focal abnormality within the liver parenchyma. Lower signal intensity of the liver parenchyma on in phase phase T1 weighted imaging compared to out of phase T1 weighted  imaging is compatible with iron overload. Mild intrahepatic biliary duct prominence is evident. Extrahepatic common duct measures 10 mm diameter. Common bile duct in the head of the pancreas is 8 mm diameter. Despite no definite stones in the lumen of the gallbladder. Several tiny 2-3 mm stones are identified in the distal common bile duct (well demonstrated on coronal image 5 of series 9). Pancreas: No focal mass lesion. No dilatation of the main duct. No intraparenchymal cyst. No peripancreatic edema. Spleen: No splenomegaly. No focal mass lesion. Adrenals/Urinary Tract: Both kidneys are markedly atrophic with tiny renal  cysts noted bilaterally. Stomach/Bowel: Stomach is nondistended. No gastric wall thickening. No evidence of outlet obstruction. Duodenum is normally positioned as is the ligament of Treitz. No evidence for small bowel or colonic dilatation within the visualized abdomen. Vascular/Lymphatic: No abdominal aortic aneurysm no evidence for lymphadenopathy in the abdomen. Other: No free fluid. Musculoskeletal: No abnormal marrow signal within the visualized bony anatomy. IMPRESSION: 1. Multiple tiny stones in the distal common bile duct, consistent with choledocholithiasis. 2. Loss of signal intensity in the liver parenchyma on in phase T1 weighted imaging is compatible with an iron overload syndrome. Electronically Signed   By: Misty Stanley M.D.   On: 09/06/2016 15:16     Medications:    . allopurinol  100 mg Oral Daily  . aspirin EC  81 mg Oral Daily  . atorvastatin  40 mg Oral q1800  . ciprofloxacin  500 mg Oral Q1500  . heparin  5,000 Units Subcutaneous Q8H  . insulin aspart  0-5 Units Subcutaneous QHS  . insulin aspart  0-9 Units Subcutaneous TID WC  . levETIRAcetam  1,000 mg Oral Daily  . levETIRAcetam  250 mg Oral Q T,Th,Sa-HD  . meropenem  500 mg Intravenous Q24H  . pantoprazole  40 mg Oral Daily   ondansetron **OR** ondansetron (ZOFRAN) IV  Assessment/ Plan:  [name] is a 66 y.o. 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   TTS [facility]  1. End Stage Renal Disease with hyperkalemia: TTS schedule. Tolerated treatment well. UF of 2 litres.  2. Hypertension: blood pressure elevated. Holding BP medications.   3. Anemia of chronic kidney disease: hemoglobin 12.1. Hold EPO  4. Secondary Hyperparathyroidism: with hyperphosphatemia. Not currently on binders.    LOS: [place], [place] 3/25/201810:23 AM

## 2016-09-08 NOTE — Progress Notes (Signed)
CC: CBD stone Subjective: No complaints this am, taking po, No abd pain  Objective: Vital signs in last 24 hours: Temp:  [98 F (36.7 C)-98.1 F (36.7 C)] 98 F (36.7 C) (03/25 0634) Pulse Rate:  [70-88] 70 (03/25 0634) Resp:  [14-33] 14 (03/25 0634) BP: (128-152)/(75-91) 151/75 (03/25 0634) SpO2:  [97 %-99 %] 97 % (03/25 0634) Weight:  [70.3 kg (154 lb 15.7 oz)] 70.3 kg (154 lb 15.7 oz) (03/24 1245) Last BM Date: 09/07/16  Intake/Output from previous day: 03/24 0701 - 03/25 0700 In: 120 [P.O.:120] Out: 2000  Intake/Output this shift: No intake/output data recorded.  Physical exam: NAD awake alert Abd: soft, NT, no peritonitis , no murphy Ext: well perfused and no edema  Lab Results: CBC   Recent Labs  09/07/16 0330 09/08/16 0751  WBC 8.0 6.8  HGB 11.0* 12.1*  HCT 34.9* 38.5*  PLT 194 222   BMET  Recent Labs  09/07/16 0330 09/08/16 0751  NA 140 141  K 4.5 5.3*  CL 98* 97*  CO2 30 28  GLUCOSE 129* 85  BUN 39* 26*  CREATININE 10.00* 8.07*  CALCIUM 8.1* 8.8*   PT/INR No results for input(s): LABPROT, INR in the last 72 hours. ABG No results for input(s): PHART, HCO3 in the last 72 hours.  Invalid input(s): PCO2, PO2  Studies/Results: Mr Abdomen Mrcp Wo Contrast  Result Date: 09/06/2016 CLINICAL DATA:  Nausea with cough.  Elevated LFTs. EXAM: MRI ABDOMEN WITHOUT CONTRAST  (INCLUDING MRCP) TECHNIQUE: Multiplanar multisequence MR imaging of the abdomen was performed. Heavily T2-weighted images of the biliary and pancreatic ducts were obtained, and three-dimensional MRCP images were rendered by post processing. COMPARISON:  None. FINDINGS: Lower chest:  Left lower lobe atelectasis. Hepatobiliary: No focal abnormality within the liver parenchyma. Lower signal intensity of the liver parenchyma on in phase phase T1 weighted imaging compared to out of phase T1 weighted imaging is compatible with iron overload. Mild intrahepatic biliary duct prominence is  evident. Extrahepatic common duct measures 10 mm diameter. Common bile duct in the head of the pancreas is 8 mm diameter. Despite no definite stones in the lumen of the gallbladder. Several tiny 2-3 mm stones are identified in the distal common bile duct (well demonstrated on coronal image 5 of series 9). Pancreas: No focal mass lesion. No dilatation of the main duct. No intraparenchymal cyst. No peripancreatic edema. Spleen: No splenomegaly. No focal mass lesion. Adrenals/Urinary Tract: Both kidneys are markedly atrophic with tiny renal cysts noted bilaterally. Stomach/Bowel: Stomach is nondistended. No gastric wall thickening. No evidence of outlet obstruction. Duodenum is normally positioned as is the ligament of Treitz. No evidence for small bowel or colonic dilatation within the visualized abdomen. Vascular/Lymphatic: No abdominal aortic aneurysm no evidence for lymphadenopathy in the abdomen. Other: No free fluid. Musculoskeletal: No abnormal marrow signal within the visualized bony anatomy. IMPRESSION: 1. Multiple tiny stones in the distal common bile duct, consistent with choledocholithiasis. 2. Loss of signal intensity in the liver parenchyma on in phase T1 weighted imaging is compatible with an iron overload syndrome. Electronically Signed   By: Misty Stanley M.D.   On: 09/06/2016 15:16   Mr 3d Recon At Scanner  Result Date: 09/06/2016 CLINICAL DATA:  Nausea with cough.  Elevated LFTs. EXAM: MRI ABDOMEN WITHOUT CONTRAST  (INCLUDING MRCP) TECHNIQUE: Multiplanar multisequence MR imaging of the abdomen was performed. Heavily T2-weighted images of the biliary and pancreatic ducts were obtained, and three-dimensional MRCP images were rendered by post processing. COMPARISON:  None. FINDINGS: Lower chest:  Left lower lobe atelectasis. Hepatobiliary: No focal abnormality within the liver parenchyma. Lower signal intensity of the liver parenchyma on in phase phase T1 weighted imaging compared to out of phase T1  weighted imaging is compatible with iron overload. Mild intrahepatic biliary duct prominence is evident. Extrahepatic common duct measures 10 mm diameter. Common bile duct in the head of the pancreas is 8 mm diameter. Despite no definite stones in the lumen of the gallbladder. Several tiny 2-3 mm stones are identified in the distal common bile duct (well demonstrated on coronal image 5 of series 9). Pancreas: No focal mass lesion. No dilatation of the main duct. No intraparenchymal cyst. No peripancreatic edema. Spleen: No splenomegaly. No focal mass lesion. Adrenals/Urinary Tract: Both kidneys are markedly atrophic with tiny renal cysts noted bilaterally. Stomach/Bowel: Stomach is nondistended. No gastric wall thickening. No evidence of outlet obstruction. Duodenum is normally positioned as is the ligament of Treitz. No evidence for small bowel or colonic dilatation within the visualized abdomen. Vascular/Lymphatic: No abdominal aortic aneurysm no evidence for lymphadenopathy in the abdomen. Other: No free fluid. Musculoskeletal: No abnormal marrow signal within the visualized bony anatomy. IMPRESSION: 1. Multiple tiny stones in the distal common bile duct, consistent with choledocholithiasis. 2. Loss of signal intensity in the liver parenchyma on in phase T1 weighted imaging is compatible with an iron overload syndrome. Electronically Signed   By: Misty Stanley M.D.   On: 09/06/2016 15:16    Anti-infectives: Anti-infectives    Start     Dose/Rate Route Frequency Ordered Stop   09/07/16 1500  ciprofloxacin (CIPRO) tablet 500 mg     500 mg Oral Daily 09/07/16 1058     09/06/16 1800  meropenem (MERREM) IVPB SOLR 500 mg     500 mg 100 mL/hr over 30 Minutes Intravenous Every 24 hours 09/06/16 1625     09/05/16 1800  ceFEPIme (MAXIPIME) 1 g in dextrose 5 % 50 mL IVPB  Status:  Discontinued     1 g 100 mL/hr over 30 Minutes Intravenous Every 24 hours 09/04/16 2237 09/06/16 1613   09/04/16 2330  ceFEPIme  (MAXIPIME) 1 g in dextrose 5 % 50 mL IVPB     1 g 100 mL/hr over 30 Minutes Intravenous  Once 09/04/16 2237 09/05/16 0243   09/04/16 2300  vancomycin (VANCOCIN) 1,500 mg in sodium chloride 0.9 % 500 mL IVPB     1,500 mg 250 mL/hr over 120 Minutes Intravenous  Once 09/04/16 2213 09/05/16 0124   09/04/16 2237  vancomycin (VANCOCIN) IVPB 750 mg/150 ml premix  Status:  Discontinued     750 mg 150 mL/hr over 60 Minutes Intravenous Every Dialysis 09/04/16 2237 09/05/16 0837      Assessment/Plan: CBD stone for ERCP in am No need for emergent surgical intervention Lap chole after ERCP  Caroleen Hamman, MD, FACS  09/08/2016

## 2016-09-08 NOTE — Consult Note (Signed)
[place]  CARDIOLOGY CONSULT NOTE  Patient ID: [name] MRN: [id number] DOB/AGE: 65/25/1952 65 y.o.  Admit date: 09/04/2016 Referring Physician Dr. Benjie Karvonen Primary Physician   Primary Cardiologist Dr. [name] Reason for Consultation meds management  HPI: 66 year old m with historyary artery disease, end-stage renal disease on hemodialysis, diabetes, hypertension, systolic heart failure status post PCI in the proximal LAD in 2016 with restenosis proximal edge of the LAD stent with subsequent coronary artery bypass graftingin 2017. He has been on aspirin and Plavix since that time. He is admitted now with fever right shoulder pain and chills. He was treated for pneumonia in the past for this problem however ultrasound of the right upper quadrant showed a dilated common bile duct with a probable stone in the duct. He is being considered forpossible ERCP due to partial biliary obstruction with transient cholangitis. Will likely need a cholecystectomy postERCP. Question regarding Plavix raise. Patient is greater then 1 year post PCI. He may come off Plavix.  Review of Systems  Constitutional: Positive for chills, fever and malaise/fatigue.  HENT: Negative.   Eyes: Negative.   Respiratory: Negative.   Cardiovascular: Negative.   Gastrointestinal: Positive for abdominal pain, nausea and vomiting.  Genitourinary: Negative.   Musculoskeletal: Negative.   Skin: Negative.   Neurological: Negative.   Endo/Heme/Allergies: Negative.   Psychiatric/Behavioral: Negative.     Past Medical History:  Diagnosis Date  . Chronic diastolic congestive heart failure ([place])   . Chronic disease anemia   . ESRD (end stage renal disease) on dialysis ([place])    "[place]; [place]; [name]" (09/29/2014)  . GERD (gastroesophageal reflux disease)   . Gout   . High cholesterol   . History of blood transfusion    "related to anemia"  . History of stomach ulcers   .  Hypertension   . Type II diabetes mellitus ([facility])     Family History  Problem Relation Age of Onset  . Hypertension Mother   . Stroke Mother   . Hypertension Father   . Diabetes Mellitus II Father   . Asthma Father     Social History   Social History  . Marital status: Married    Spouse name: N/A  . Number of children: N/A  . Years of education: N/A   Occupational History  . Not on file.   Social History Main Topics  . Smoking status: Former Smoker    Types: Cigarettes  . Smokeless tobacco: Never Used     Comment: "quit smoking cigarettes in the 1980's"  . Alcohol use No  . Drug use: No  . Sexual activity: Not on file   Other Topics Concern  . Not on file   Social History Narrative  . No narrative on file    Past Surgical History:  Procedure Laterality Date  . ARTERIOVENOUS GRAFT PLACEMENT Left ~ 1996  . CARDIAC CATHETERIZATION  09/29/2014   "[facility]"  . CARDIAC CATHETERIZATION N/A 02/26/2016   Procedure: Left Heart Cath and Coronary Angiography Possible PCI;  Surgeon: [name], MD;  Location: [facility];  Service: Cardiovascular;  Laterality: N/A;  . KIDNEY TRANSPLANT Right 2004  . NEPHRECTOMY TRANSPLANTED ORGAN  2015  . PERCUTANEOUS CORONARY STENT INTERVENTION (PCI-S) N/A 09/30/2014   Procedure: PERCUTANEOUS CORONARY STENT INTERVENTION (PCI-S);  Surgeon: [name], MD;  Location: [facility] CATH LAB;  Service: Cardiovascular;  Laterality: N/A;  . PERITONEAL CATHETER INSERTION  02/2014  . PERITONEAL CATHETER REMOVAL  08/2014  . THROMBECTOMY /  ARTERIOVENOUS GRAFT REVISION  2015     Prescriptions Prior to Admission  Medication Sig Dispense Refill Last Dose  . acetaminophen (TYLENOL) 325 MG tablet Take 2 tablets (650 mg total) by mouth every 6 (six) hours as needed for mild pain (or Fever >/= 101). 30 tablet 0 prn at prn  . albuterol (PROVENTIL) (2.5 MG/3ML) 0.083% nebulizer solution Take 2.5 mg by nebulization every 6 (six) hours as needed for wheezing  or shortness of breath.   prn at prn  . allopurinol (ZYLOPRIM) 100 MG tablet Take 100 mg by mouth daily.  10 09/04/2016 at am  . aspirin 81 MG EC tablet Take 81 mg by mouth daily.  10 09/04/2016 at am  . atorvastatin (LIPITOR) 40 MG tablet Take 1 tablet (40 mg total) by mouth daily at 6 PM. (Patient taking differently: Take 80 mg by mouth daily at 6 PM. ) 30 tablet 0 09/04/2016 at am  . B Complex-C (B-COMPLEX WITH VITAMIN C) tablet Take 1 tablet by mouth daily.   09/04/2016 at am  . clopidogrel (PLAVIX) 75 MG tablet Take 1 tablet (75 mg total) by mouth daily with breakfast. 30 tablet 11 09/04/2016 at am  . epoetin alfa (EPOGEN,PROCRIT) 4000 UNIT/ML injection Inject 4,000 Units into the vein.   09/03/2016 at am  . levETIRAcetam (KEPPRA) 1000 MG tablet Take 1 tablet (1,000 mg total) by mouth daily. 60 tablet 1 09/04/2016 at am  . levETIRAcetam (KEPPRA) 250 MG tablet Take on the Days on Dialysis (Tues, Thurs, Sat) after Dialysis. 60 tablet 1 09/03/2016 at Unknown time  . meclizine (ANTIVERT) 12.5 MG tablet Take 12.5 mg by mouth 3 (three) times daily as needed.   prn at prn  . mirtazapine (REMERON) 7.5 MG tablet Take 7.5 mg by mouth at bedtime.   09/03/2016 at qhs  . nitroGLYCERIN (NITROGLYN) 2 % ointment Apply 0.5 inches topically every 6 (six) hours. 30 g 0 prn at prn  . nitroGLYCERIN (NITROSTAT) 0.4 MG SL tablet Place 1 tablet (0.4 mg total) under the tongue every 5 (five) minutes x 3 doses as needed for chest pain. 25 tablet 12 prn at prn  . pantoprazole (PROTONIX) 40 MG tablet Take 40 mg by mouth daily.   09/04/2016 at am  . heparin 100-0.45 UNIT/ML-% infusion Inject 1,000 Units/hr into the vein continuous. 250 mL 0   . Melatonin 3 MG TABS Take 3 mg by mouth at bedtime.   Not Taking at Unknown time  . multivitamin (RENA-VIT) TABS tablet Take 1 tablet by mouth at bedtime. (Patient not taking: Reported on 09/04/2016) 30 tablet 3 Not Taking at Unknown time    Physical Exam: Blood pressure (!) 151/75, pulse  70, temperature 98 F (36.7 C), temperature source Oral, resp. rate 14, height 4\' 9"  (1.448 m), weight 70.3 kg (154 lb 15.7 oz), SpO2 97 %.   Wt Readings from Last 1 Encounters:  09/07/16 70.3 kg (154 lb 15.7 oz)     General appearance: cooperative Resp: clear to auscultation bilaterally Chest wall: no tenderness Cardio: regular rate and rhythm GI: abnormal findings:  moderate tenderness in the RUQ Extremities: extremities normal, atraumatic, no cyanosis or edema Neurologic: Grossly normal  Labs:   Lab Results  Component Value Date   WBC 6.8 09/08/2016   HGB 12.1 (L) 09/08/2016   HCT 38.5 (L) 09/08/2016   MCV 74.9 (L) 09/08/2016   PLT 222 09/08/2016    Recent Labs Lab 09/08/16 0751  NA 141  K 5.3*  CL 97*  CO2 28  BUN 26*  CREATININE 8.07*  CALCIUM 8.8*  PROT 8.2*  BILITOT 1.9*  ALKPHOS 238*  ALT 113*  AST 55*  GLUCOSE 85   Lab Results  Component Value Date   CKTOTAL 42 (L) 09/27/2014   CKMB 0.5 09/27/2014   TROPONINI 0.03 (HH) 09/05/2016      Radiology:chest x-ray cardiomegaly with dense consolidation in the lingula and left lower lobe with no pulmonary edema. EKG: Sinus rhythm with no ischemia  ASSESSMENT AND PLAN:  Patient is 66 year old male with history of coronary artery disease status post PCI in 2016of the proximal LAD withat the proximal edge of the stent requiring coronary bypass grafting which was done in 2017. He has evidence of cholangitis with a retained stone which will likely need ERCP followed by probable cholecystectomy. Patient may come off of Plavix. He may remain off Plavix through the perioperative period. Signed: Teodoro Spray MD, [facility] 09/08/2016, 11:46 AM

## 2016-09-08 NOTE — Consult Note (Addendum)
Subjective: Denies any abdominal pain. LFTs improving, afebrile, vitals stable   Objective: Vital signs in last 24 hours: Vitals:   09/07/16 1306 09/07/16 2009 09/08/16 0634 09/08/16 1217  BP: 130/89 (!) 152/77 (!) 151/75 (!) 146/72  Pulse: 88 76 70 70  Resp: (!) 22 18 14 16   Temp: 98.1 F (36.7 C) 98 F (36.7 C) 98 F (36.7 C) 98.1 F (36.7 C)  TempSrc: Oral Oral Oral Oral  SpO2: 99% 99% 97% 97%  Weight:      Height:       Weight change: 0.7 kg (1 lb 8.7 oz)  Intake/Output Summary (Last 24 hours) at 09/08/16 1554 Last data filed at 09/08/16 0126  Gross per 24 hour  Intake              120 ml  Output                0 ml  Net              120 ml     Exam: Heart:: Regular rate and rhythm Lungs: clear to auscultation Abdomen: soft, nontender, normal bowel sounds   Lab Results: @LABTEST2 @ Micro Results: Recent Results (from the past 240 hour(s))  Blood culture (routine x 2)     Status: Abnormal   Collection Time: 09/04/16  9:01 PM  Result Value Ref Range Status   Specimen Description BLOOD RIGHT FOREARM  Final   Special Requests BOTTLES DRAWN AEROBIC AND ANAEROBIC  BCHV  Final   Culture  Setup Time   Final    GRAM NEGATIVE RODS IN BOTH AEROBIC AND ANAEROBIC BOTTLES CRITICAL RESULT CALLED TO, READ BACK BY AND VERIFIED WITH: HANK ZOMPA 09/05/16 1128 SGD    Culture (A)  Final    ESCHERICHIA COLI SUSCEPTIBILITIES PERFORMED ON PREVIOUS CULTURE WITHIN THE LAST 5 DAYS. Performed at [facility], [place] [address], [place], [place] [address]    Report Status 09/07/2016 FINAL  Final  Blood culture (routine x 2)     Status: Abnormal   Collection Time: 09/04/16  9:01 PM  Result Value Ref Range Status   Specimen Description BLOOD RIGHT ANTECUBITAL  Final   Special Requests BOTTLES DRAWN AEROBIC AND ANAEROBIC  BCHV  Final   Culture  Setup Time   Final    GRAM NEGATIVE RODS IN BOTH AEROBIC AND ANAEROBIC BOTTLES CRITICAL RESULT CALLED TO, READ BACK BY AND  VERIFIED WITH: HANK ZOMPA 09/05/16 1128 SGD Performed at [facility], [place] [address] [place], [place] [address]    Culture ESCHERICHIA COLI (A)  Final   Report Status 09/07/2016 FINAL  Final   Organism ID, Bacteria ESCHERICHIA COLI  Final      Susceptibility   Escherichia coli - MIC*    AMPICILLIN <=2 SENSITIVE Sensitive     CEFAZOLIN <=4 SENSITIVE Sensitive     CEFEPIME <=1 SENSITIVE Sensitive     CEFTAZIDIME <=1 SENSITIVE Sensitive     CEFTRIAXONE <=1 SENSITIVE Sensitive     CIPROFLOXACIN <=0.25 SENSITIVE Sensitive     GENTAMICIN <=1 SENSITIVE Sensitive     IMIPENEM <=0.25 SENSITIVE Sensitive     TRIMETH/SULFA <=20 SENSITIVE Sensitive     AMPICILLIN/SULBACTAM <=2 SENSITIVE Sensitive     PIP/TAZO <=4 SENSITIVE Sensitive     Extended ESBL NEGATIVE Sensitive     * ESCHERICHIA COLI  Blood Culture ID Panel (Reflexed)     Status: Abnormal   Collection Time: 09/04/16  9:01 PM  Result Value Ref Range Status   Enterococcus species NOT DETECTED NOT DETECTED Final   Listeria monocytogenes NOT DETECTED NOT DETECTED Final   Staphylococcus species NOT DETECTED NOT DETECTED Final   Staphylococcus aureus NOT DETECTED NOT DETECTED Final   Streptococcus species NOT DETECTED NOT DETECTED Final   Streptococcus agalactiae NOT DETECTED NOT DETECTED Final   Streptococcus pneumoniae NOT DETECTED NOT DETECTED Final   Streptococcus pyogenes NOT DETECTED NOT DETECTED Final   Acinetobacter baumannii NOT DETECTED NOT DETECTED Final   Enterobacteriaceae species DETECTED (A) NOT DETECTED Final    Comment: Enterobacteriaceae represent a large family of gram-negative bacteria, not a single organism. CRITICAL RESULT CALLED TO, READ BACK BY AND VERIFIED WITH: HANK ZOMPA 09/05/16 1128 SGD    Enterobacter cloacae complex NOT DETECTED NOT DETECTED Final   Escherichia coli DETECTED (A) NOT DETECTED Final    Comment: CRITICAL RESULT CALLED TO, READ BACK BY AND VERIFIED WITH: HANK ZOMPA 09/05/16 1128 SGD     Klebsiella oxytoca NOT DETECTED NOT DETECTED Final   Klebsiella pneumoniae NOT DETECTED NOT DETECTED Final   Proteus species NOT DETECTED NOT DETECTED Final   Serratia marcescens NOT DETECTED NOT DETECTED Final   Carbapenem resistance NOT DETECTED NOT DETECTED Final   Haemophilus influenzae NOT DETECTED NOT DETECTED Final   Neisseria meningitidis NOT DETECTED NOT DETECTED Final   Pseudomonas aeruginosa NOT DETECTED NOT DETECTED Final   Candida albicans NOT DETECTED NOT DETECTED Final   Candida glabrata NOT DETECTED NOT DETECTED Final   Candida krusei NOT DETECTED NOT DETECTED Final   Candida parapsilosis NOT DETECTED NOT DETECTED Final   Candida tropicalis NOT DETECTED NOT DETECTED Final  MRSA PCR Screening     Status: None   Collection Time: 09/04/16 11:14 PM  Result Value Ref Range Status   MRSA by PCR NEGATIVE NEGATIVE Final    Comment:        The GeneXpert MRSA Assay (FDA approved for NASAL specimens only), is one component of a comprehensive MRSA colonization surveillance program. It is not intended to diagnose MRSA infection nor to guide or monitor treatment for MRSA infections.    Studies/Results: No results found. Medications: I have reviewed the patient's current medications. Scheduled Meds: . allopurinol  100 mg Oral Daily  . aspirin EC  81 mg Oral Daily  . atorvastatin  40 mg Oral q1800  . ciprofloxacin  500 mg Oral Q1500  . heparin  5,000 Units Subcutaneous Q8H  . insulin aspart  0-5 Units Subcutaneous QHS  . insulin aspart  0-9 Units Subcutaneous TID WC  . levETIRAcetam  1,000 mg Oral Daily  . levETIRAcetam  250 mg Oral Q T,Th,Sa-HD  . meropenem  500 mg Intravenous Q24H  . pantoprazole  40 mg Oral Daily   Continuous Infusions: PRN Meds:.ondansetron **OR** ondansetron (ZOFRAN) IV   Assessment: Principal Problem:   Sepsis ([place]) Active Problems:   ESRD on dialysis ([place])   Diabetes (H[facility])   HTN (hypertension)   GERD (gastroesophageal reflux disease)    Chronic diastolic CHF (congestive heart failure) (HCC)   Transaminitis   CAP (community acquired pneumonia)   Choledocholithiasis    Plan: - Continue Cipro to treat E Coli bacteremia - ERCP by Dr Allen Norris for stone extraction after holding plavix for 72hrs (last dose on 09/07/16) - General surgery to perform cholecystectomy after ERCP - Advance diet as tolerated - Monitor daily LFTs and closely monitor for any clinical worsening - Continue to hold plavix   LOS: 4 days  Dyna Figuereo 09/08/2016, 3:54 PM

## 2016-09-08 NOTE — Progress Notes (Signed)
Per Dr. Ubaldo Glassing (cardiologist) okay to stop plavix for procedure or surgery.

## 2016-09-09 LAB — CBC
HCT: 38.4 % — ABNORMAL LOW (ref 40.0–52.0)
Hemoglobin: 12.2 g/dL — ABNORMAL LOW (ref 13.0–18.0)
MCH: 23.5 pg — AB (ref 26.0–34.0)
MCHC: 31.7 g/dL — ABNORMAL LOW (ref 32.0–36.0)
MCV: 73.9 fL — AB (ref 80.0–100.0)
Platelets: 235 10*3/uL (ref 150–440)
RBC: 5.19 MIL/uL (ref 4.40–5.90)
RDW: 18.9 % — ABNORMAL HIGH (ref 11.5–14.5)
WBC: 6.7 10*3/uL (ref 3.8–10.6)

## 2016-09-09 LAB — IRON AND TIBC
Iron: 55 ug/dL (ref 45–182)
SATURATION RATIOS: 24 % (ref 17.9–39.5)
TIBC: 227 ug/dL — AB (ref 250–450)
UIBC: 172 ug/dL

## 2016-09-09 LAB — COMPREHENSIVE METABOLIC PANEL
ALT: 86 U/L — ABNORMAL HIGH (ref 17–63)
AST: 41 U/L (ref 15–41)
Albumin: 3.5 g/dL (ref 3.5–5.0)
Alkaline Phosphatase: 241 U/L — ABNORMAL HIGH (ref 38–126)
Anion gap: 15 (ref 5–15)
BUN: 39 mg/dL — ABNORMAL HIGH (ref 6–20)
CHLORIDE: 96 mmol/L — AB (ref 101–111)
CO2: 28 mmol/L (ref 22–32)
Calcium: 8.5 mg/dL — ABNORMAL LOW (ref 8.9–10.3)
Creatinine, Ser: 9.82 mg/dL — ABNORMAL HIGH (ref 0.61–1.24)
GFR, EST AFRICAN AMERICAN: 6 mL/min — AB (ref >60)
GFR, EST NON AFRICAN AMERICAN: 5 mL/min — AB (ref >60)
Glucose, Bld: 122 mg/dL — ABNORMAL HIGH (ref 65–99)
Potassium: 5 mmol/L (ref 3.5–5.1)
Sodium: 139 mmol/L (ref 135–145)
Total Bilirubin: 1.5 mg/dL — ABNORMAL HIGH (ref 0.3–1.2)
Total Protein: 8.1 g/dL (ref 6.5–8.1)

## 2016-09-09 LAB — GLUCOSE, CAPILLARY
GLUCOSE-CAPILLARY: 100 mg/dL — AB (ref 65–99)
GLUCOSE-CAPILLARY: 113 mg/dL — AB (ref 65–99)
Glucose-Capillary: 100 mg/dL — ABNORMAL HIGH (ref 65–99)
Glucose-Capillary: 109 mg/dL — ABNORMAL HIGH (ref 65–99)

## 2016-09-09 LAB — FERRITIN: Ferritin: 1753 ng/mL — ABNORMAL HIGH (ref 24–336)

## 2016-09-09 NOTE — Progress Notes (Signed)
CC: Common bile duct stone Subjective: Patient is interviewed with the assistance of a significant other present. He has no abdominal pain this morning and no nausea or vomiting no fevers or chills. Buttocks for gram-negative bacteremia. Objective: Vital signs in last 24 hours: Temp:  [97.4 F (36.3 C)-98.1 F (36.7 C)] 97.7 F (36.5 C) (03/26 0552) Pulse Rate:  [70-78] 71 (03/26 0552) Resp:  [16] 16 (03/26 0552) BP: (128-151)/(63-83) 128/63 (03/26 0552) SpO2:  [96 %-97 %] 96 % (03/26 0552) Last BM Date: 09/07/16  Intake/Output from previous day: 03/25 0701 - 03/26 0700 In: 480 [P.O.:480] Out: -  Intake/Output this shift: Total I/O In: 240 [P.O.:240] Out: -   Physical exam:  No icterus no jaundice abdomen is soft and nontender Calves are nontender  Lab Results: CBC   Recent Labs  09/08/16 0751 09/09/16 0401  WBC 6.8 6.7  HGB 12.1* 12.2*  HCT 38.5* 38.4*  PLT 222 235   BMET  Recent Labs  09/08/16 0751 09/09/16 0401  NA 141 139  K 5.3* 5.0  CL 97* 96*  CO2 28 28  GLUCOSE 85 122*  BUN 26* 39*  CREATININE 8.07* 9.82*  CALCIUM 8.8* 8.5*   PT/INR No results for input(s): LABPROT, INR in the last 72 hours. ABG No results for input(s): PHART, HCO3 in the last 72 hours.  Invalid input(s): PCO2, PO2  Studies/Results: No results found.  Anti-infectives: Anti-infectives    Start     Dose/Rate Route Frequency Ordered Stop   09/07/16 1500  ciprofloxacin (CIPRO) tablet 500 mg     500 mg Oral Daily 09/07/16 1058     09/06/16 1800  meropenem (MERREM) IVPB SOLR 500 mg  Status:  Discontinued     500 mg 100 mL/hr over 30 Minutes Intravenous Every 24 hours 09/06/16 1625 09/09/16 0953   09/05/16 1800  ceFEPIme (MAXIPIME) 1 g in dextrose 5 % 50 mL IVPB  Status:  Discontinued     1 g 100 mL/hr over 30 Minutes Intravenous Every 24 hours 09/04/16 2237 09/06/16 1613   09/04/16 2330  ceFEPIme (MAXIPIME) 1 g in dextrose 5 % 50 mL IVPB     1 g 100 mL/hr over 30  Minutes Intravenous  Once 09/04/16 2237 09/05/16 0243   09/04/16 2300  vancomycin (VANCOCIN) 1,500 mg in sodium chloride 0.9 % 500 mL IVPB     1,500 mg 250 mL/hr over 120 Minutes Intravenous  Once 09/04/16 2213 09/05/16 0124   09/04/16 2237  vancomycin (VANCOCIN) IVPB 750 mg/150 ml premix  Status:  Discontinued     750 mg 150 mL/hr over 60 Minutes Intravenous Every Dialysis 09/04/16 2237 09/05/16 0837      Assessment/Plan:  Choledocholithiasis. Unclear as to the timing of ERCP and stone extraction. He is on Plavix and I believe it will be scheduled the next day or 2 but probably not today. We'll follow with cholecystectomy after that.  Florene Glen, MD, FACS  09/09/2016

## 2016-09-09 NOTE — Progress Notes (Signed)
[facility] INFECTIOUS DISEASE PROGRESS NOTE Date of Admission:  09/04/2016     ID: [name] is a 66 y.o. male with E coli bacteremia Principal Problem:   Sepsis ([place]) Active Problems:   ESRD on dialysis ([place])   Diabetes ([place])   HTN (hypertension)   GERD (gastroesophageal reflux disease)   Chronic diastolic CHF (congestive heart failure) ([facility])   Transaminitis   CAP (community acquired pneumonia)   Choledocholithiasis   Subjective: No fevers, no abd pain   ROS  Eleven systems are reviewed and negative except per hpi  Medications:  Antibiotics Given (last 72 hours)    Date/Time Action Medication Dose Rate   09/06/16 1722 Given   meropenem (MERREM) IVPB SOLR 500 mg 500 mg 100 mL/hr   09/07/16 1403 Given   ciprofloxacin (CIPRO) tablet 500 mg 500 mg    09/07/16 1814 Given   meropenem (MERREM) IVPB SOLR 500 mg 500 mg 100 mL/hr   09/08/16 1430 Given   ciprofloxacin (CIPRO) tablet 500 mg 500 mg    09/08/16 1750 Given   meropenem (MERREM) IVPB SOLR 500 mg 500 mg 100 mL/hr     . allopurinol  100 mg Oral Daily  . aspirin EC  81 mg Oral Daily  . atorvastatin  40 mg Oral q1800  . ciprofloxacin  500 mg Oral Q1500  . heparin  5,000 Units Subcutaneous Q8H  . insulin aspart  0-5 Units Subcutaneous QHS  . insulin aspart  0-9 Units Subcutaneous TID WC  . levETIRAcetam  1,000 mg Oral Daily  . levETIRAcetam  250 mg Oral Q T,Th,Sa-HD  . pantoprazole  40 mg Oral Daily    Objective: Vital signs in last 24 hours: Temp:  [97.4 F (36.3 C)-97.7 F (36.5 C)] 97.6 F (36.4 C) (03/26 1333) Pulse Rate:  [65-78] 75 (03/26 1600) Resp:  [15-16] 16 (03/26 1600) BP: (128-160)/(63-102) 146/79 (03/26 1600) SpO2:  [96 %-99 %] 99 % (03/26 1500) Weight:  [70.9 kg (156 lb 3.2 oz)] 70.9 kg (156 lb 3.2 oz) (03/26 1333) Constitutional: He is oriented to person, place, and time. He appears well-developed and well-nourished. No distress.  HENT: anicteric Mouth/Throat: Oropharynx is clear and  dry . No oropharyngeal exudate.  Cardiovascular: Normal rate, regular rhythm and normal heart sounds. Pulmonary/Chest: Effort normal and breath sounds normal. No respiratory distress. He has no wheezes.  Abdominal: Soft. Bowel sounds are normal. He exhibits no distension. There is no tenderness.  Lymphadenopathy:  He has no cervical adenopathy.  Neurological: He is alert and oriented to person, place, and time.  Skin: Skin is warm and dry. No rash noted. No erythema.  Psychiatric: He has a normal mood and affect. His behavior is normal.   Lab Results  Recent Labs  09/08/16 0751 09/09/16 0401  WBC 6.8 6.7  HGB 12.1* 12.2*  HCT 38.5* 38.4*  NA 141 139  K 5.3* 5.0  CL 97* 96*  CO2 28 28  BUN 26* 39*  CREATININE 8.07* 9.82*    Microbiology: Results for orders placed or performed during the hospital encounter of 09/04/16  Blood culture (routine x 2)     Status: Abnormal   Collection Time: 09/04/16  9:01 PM  Result Value Ref Range Status   Specimen Description BLOOD RIGHT FOREARM  Final   Special Requests BOTTLES DRAWN AEROBIC AND ANAEROBIC  BCHV  Final   Culture  Setup Time   Final    GRAM NEGATIVE RODS IN BOTH AEROBIC AND ANAEROBIC BOTTLES CRITICAL RESULT CALLED  TO, READ BACK BY AND VERIFIED WITH: HANK ZOMPA 09/05/16 1128 SGD    Culture (A)  Final    ESCHERICHIA COLI SUSCEPTIBILITIES PERFORMED ON PREVIOUS CULTURE WITHIN THE LAST 5 DAYS. Performed at [facility], [place] [address], [place], [place] [address]    Report Status 09/07/2016 FINAL  Final  Blood culture (routine x 2)     Status: Abnormal   Collection Time: 09/04/16  9:01 PM  Result Value Ref Range Status   Specimen Description BLOOD RIGHT ANTECUBITAL  Final   Special Requests BOTTLES DRAWN AEROBIC AND ANAEROBIC  BCHV  Final   Culture  Setup Time   Final    GRAM NEGATIVE RODS IN BOTH AEROBIC AND ANAEROBIC BOTTLES CRITICAL RESULT CALLED TO, READ BACK BY AND VERIFIED WITH: HANK ZOMPA 09/05/16 1128  SGD Performed at [facility], [place] [address], [place], [place] [address]    Culture ESCHERICHIA COLI (A)  Final   Report Status 09/07/2016 FINAL  Final   Organism ID, Bacteria ESCHERICHIA COLI  Final      Susceptibility   Escherichia coli - MIC*    AMPICILLIN <=2 SENSITIVE Sensitive     CEFAZOLIN <=4 SENSITIVE Sensitive     CEFEPIME <=1 SENSITIVE Sensitive     CEFTAZIDIME <=1 SENSITIVE Sensitive     CEFTRIAXONE <=1 SENSITIVE Sensitive     CIPROFLOXACIN <=0.25 SENSITIVE Sensitive     GENTAMICIN <=1 SENSITIVE Sensitive     IMIPENEM <=0.25 SENSITIVE Sensitive     TRIMETH/SULFA <=20 SENSITIVE Sensitive     AMPICILLIN/SULBACTAM <=2 SENSITIVE Sensitive     PIP/TAZO <=4 SENSITIVE Sensitive     Extended ESBL NEGATIVE Sensitive     * ESCHERICHIA COLI  Blood Culture ID Panel (Reflexed)     Status: Abnormal   Collection Time: 09/04/16  9:01 PM  Result Value Ref Range Status   Enterococcus species NOT DETECTED NOT DETECTED Final   Listeria monocytogenes NOT DETECTED NOT DETECTED Final   Staphylococcus species NOT DETECTED NOT DETECTED Final   Staphylococcus aureus NOT DETECTED NOT DETECTED Final   Streptococcus species NOT DETECTED NOT DETECTED Final   Streptococcus agalactiae NOT DETECTED NOT DETECTED Final   Streptococcus pneumoniae NOT DETECTED NOT DETECTED Final   Streptococcus pyogenes NOT DETECTED NOT DETECTED Final   Acinetobacter baumannii NOT DETECTED NOT DETECTED Final   Enterobacteriaceae species DETECTED (A) NOT DETECTED Final    Comment: Enterobacteriaceae represent a large family of gram-negative bacteria, not a single organism. CRITICAL RESULT CALLED TO, READ BACK BY AND VERIFIED WITH: HANK ZOMPA 09/05/16 1128 SGD    Enterobacter cloacae complex NOT DETECTED NOT DETECTED Final   Escherichia coli DETECTED (A) NOT DETECTED Final    Comment: CRITICAL RESULT CALLED TO, READ BACK BY AND VERIFIED WITH: HANK ZOMPA 09/05/16 1128 SGD    Klebsiella oxytoca NOT DETECTED NOT  DETECTED Final   Klebsiella pneumoniae NOT DETECTED NOT DETECTED Final   Proteus species NOT DETECTED NOT DETECTED Final   Serratia marcescens NOT DETECTED NOT DETECTED Final   Carbapenem resistance NOT DETECTED NOT DETECTED Final   Haemophilus influenzae NOT DETECTED NOT DETECTED Final   Neisseria meningitidis NOT DETECTED NOT DETECTED Final   Pseudomonas aeruginosa NOT DETECTED NOT DETECTED Final   Candida albicans NOT DETECTED NOT DETECTED Final   Candida glabrata NOT DETECTED NOT DETECTED Final   Candida krusei NOT DETECTED NOT DETECTED Final   Candida parapsilosis NOT DETECTED NOT DETECTED Final   Candida tropicalis NOT DETECTED NOT DETECTED Final  MRSA PCR Screening     Status: None   Collection Time: 09/04/16 11:14 PM  Result Value Ref Range Status   MRSA by PCR NEGATIVE NEGATIVE Final    Comment:        The GeneXpert MRSA Assay (FDA approved for NASAL specimens only), is one component of a comprehensive MRSA colonization surveillance program. It is not intended to diagnose MRSA infection nor to guide or monitor treatment for MRSA infections.     Studies/Results: No results found.  Assessment/Plan: [name] is a 66 y.o. male admitted with sepsis and GI sxs. E coli bacteremia noted and appears to have a biliary source with MRI showing choledocholithiasis. For ERCP and then cholecystectomy. Clinically improving.   Recommendation Cont ciprofloxacin  If worsens with fevers or leukocytosis can add flagyl   Thank you very much for the consult. Will follow with you.  Gabbrielle Mcnicholas P   09/09/2016, 4:36 PM

## 2016-09-09 NOTE — Progress Notes (Signed)
HD initiated without issue. 2k bath. 0 uf as ordered. Patient currently has no complaints. Watching tv. Telemetry notified patient is on monitor #4 in room #209.

## 2016-09-09 NOTE — Progress Notes (Signed)
Pre HD  

## 2016-09-09 NOTE — Progress Notes (Signed)
Pre hd assessment  

## 2016-09-09 NOTE — Progress Notes (Signed)
Dr Marius Ditch called to the unit last PM and informed that the surgical procedure scheduled for today will be held due to the fact that the Pt had his last dose of Plavix earlier on 3/25. Procedure may now be done on 3/27. Pt and family was notified.

## 2016-09-09 NOTE — Progress Notes (Signed)
[facility]  ROUNDING NOTE   Subjective:   Patient is doing  Fair No nausea or vomiting reported    Objective:  Vital signs in last 24 hours:  Temp:  [97 F (36.1 C)-97.7 F (36.5 C)] 97 F (36.1 C) (03/26 1637) Pulse Rate:  [65-83] 82 (03/26 1639) Resp:  [15-16] 15 (03/26 1639) BP: (128-160)/(63-102) 137/76 (03/26 1639) SpO2:  [96 %-99 %] 98 % (03/26 1637) Weight:  [70.9 kg (156 lb 3.2 oz)-70.9 kg (156 lb 4.9 oz)] 70.9 kg (156 lb 4.9 oz) (03/26 1637)  Weight change:  Filed Weights   09/07/16 1245 09/09/16 1333 09/09/16 1637  Weight: 70.3 kg (154 lb 15.7 oz) 70.9 kg (156 lb 3.2 oz) 70.9 kg (156 lb 4.9 oz)    Intake/Output: I/O last 3 completed shifts: In: 600 [P.O.:600] Out: 0    Intake/Output this shift:  Total I/O In: 240 [P.O.:240] Out: 0   Physical Exam: General: NAD,   Head: Normocephalic, atraumatic. Moist oral mucosal membranes  Eyes: Anicteric, PERRL  Neck: Supple, trachea midline  Lungs:  Clear to auscultation  Heart: Regular rate and rhythm  Abdomen:  Soft, nontender,   Extremities: no peripheral edema.  Neurologic: Nonfocal, moving all four extremities  Skin: No lesions  Access: Left AVF    Basic Metabolic Panel:  Recent Labs Lab 09/05/16 0607 09/06/16 0509 09/07/16 0330 09/08/16 0751 09/09/16 0401  NA 138 140 140 141 139  K 6.9* 4.8 4.5 5.3* 5.0  CL 99* 98* 98* 97* 96*  CO2 24 29 30 28 28   GLUCOSE 142* 114* 129* 85 122*  BUN 59* 28* 39* 26* 39*  CREATININE 11.40* 7.37* 10.00* 8.07* 9.82*  CALCIUM 7.5* 8.1* 8.1* 8.8* 8.5*    Liver Function Tests:  Recent Labs Lab 09/04/16 1834 09/06/16 0509 09/07/16 0330 09/08/16 0751 09/09/16 0401  AST 841* 160* 89* 55* 41  ALT 456* 208* 151* 113* 86*  ALKPHOS 336* 257* 257* 238* 241*  BILITOT 3.3* 3.1* 1.7* 1.9* 1.5*  PROT 8.9* 7.1 7.5 8.2* 8.1  ALBUMIN 4.1 3.2* 3.2* 3.5 3.5    Recent Labs Lab 09/04/16 1834  LIPASE 50    Recent Labs Lab 09/04/16 1834  AMMONIA 34     CBC:  Recent Labs Lab 09/04/16 1834 09/05/16 0607 09/07/16 0330 09/08/16 0751 09/09/16 0401  WBC 10.0 17.1* 8.0 6.8 6.7  NEUTROABS 8.9*  --   --   --   --   HGB 13.4 11.5* 11.0* 12.1* 12.2*  HCT 42.9 36.8* 34.9* 38.5* 38.4*  MCV 74.7* 75.1* 75.0* 74.9* 73.9*  PLT 241 223 194 222 235    Cardiac Enzymes:  Recent Labs Lab 09/04/16 1834 09/05/16 0104 09/05/16 0607  TROPONINI 0.03* 0.03* 0.03*    BNP: Invalid input(s): POCBNP  CBG:  Recent Labs Lab 09/08/16 1626 09/08/16 2114 09/09/16 0756 09/09/16 1120 09/09/16 1652  GLUCAP 161* 123* 100* 109* 100*    Microbiology: Results for orders placed or performed during the hospital encounter of 09/04/16  Blood culture (routine x 2)     Status: Abnormal   Collection Time: 09/04/16  9:01 PM  Result Value Ref Range Status   Specimen Description BLOOD RIGHT FOREARM  Final   Special Requests BOTTLES DRAWN AEROBIC AND ANAEROBIC  BCHV  Final   Culture  Setup Time   Final    GRAM NEGATIVE RODS IN BOTH AEROBIC AND ANAEROBIC BOTTLES CRITICAL RESULT CALLED TO, READ BACK BY AND VERIFIED WITH: HANK ZOMPA 09/05/16 1128 SGD  Culture (A)  Final    ESCHERICHIA COLI SUSCEPTIBILITIES PERFORMED ON PREVIOUS CULTURE WITHIN THE LAST 5 DAYS. Performed at [facility], [place] [address] [place], [place] [address]    Report Status 09/07/2016 FINAL  Final  Blood culture (routine x 2)     Status: Abnormal   Collection Time: 09/04/16  9:01 PM  Result Value Ref Range Status   Specimen Description BLOOD RIGHT ANTECUBITAL  Final   Special Requests BOTTLES DRAWN AEROBIC AND ANAEROBIC  BCHV  Final   Culture  Setup Time   Final    GRAM NEGATIVE RODS IN BOTH AEROBIC AND ANAEROBIC BOTTLES CRITICAL RESULT CALLED TO, READ BACK BY AND VERIFIED WITH: HANK ZOMPA 09/05/16 1128 SGD Performed at [facility], [place] [address], [place], [place] [address]    Culture ESCHERICHIA COLI (A)  Final   Report Status 09/07/2016 FINAL  Final    Organism ID, Bacteria ESCHERICHIA COLI  Final      Susceptibility   Escherichia coli - MIC*    AMPICILLIN <=2 SENSITIVE Sensitive     CEFAZOLIN <=4 SENSITIVE Sensitive     CEFEPIME <=1 SENSITIVE Sensitive     CEFTAZIDIME <=1 SENSITIVE Sensitive     CEFTRIAXONE <=1 SENSITIVE Sensitive     CIPROFLOXACIN <=0.25 SENSITIVE Sensitive     GENTAMICIN <=1 SENSITIVE Sensitive     IMIPENEM <=0.25 SENSITIVE Sensitive     TRIMETH/SULFA <=20 SENSITIVE Sensitive     AMPICILLIN/SULBACTAM <=2 SENSITIVE Sensitive     PIP/TAZO <=4 SENSITIVE Sensitive     Extended ESBL NEGATIVE Sensitive     * ESCHERICHIA COLI  Blood Culture ID Panel (Reflexed)     Status: Abnormal   Collection Time: 09/04/16  9:01 PM  Result Value Ref Range Status   Enterococcus species NOT DETECTED NOT DETECTED Final   Listeria monocytogenes NOT DETECTED NOT DETECTED Final   Staphylococcus species NOT DETECTED NOT DETECTED Final   Staphylococcus aureus NOT DETECTED NOT DETECTED Final   Streptococcus species NOT DETECTED NOT DETECTED Final   Streptococcus agalactiae NOT DETECTED NOT DETECTED Final   Streptococcus pneumoniae NOT DETECTED NOT DETECTED Final   Streptococcus pyogenes NOT DETECTED NOT DETECTED Final   Acinetobacter baumannii NOT DETECTED NOT DETECTED Final   Enterobacteriaceae species DETECTED (A) NOT DETECTED Final    Comment: Enterobacteriaceae represent a large family of gram-negative bacteria, not a single organism. CRITICAL RESULT CALLED TO, READ BACK BY AND VERIFIED WITH: HANK ZOMPA 09/05/16 1128 SGD    Enterobacter cloacae complex NOT DETECTED NOT DETECTED Final   Escherichia coli DETECTED (A) NOT DETECTED Final    Comment: CRITICAL RESULT CALLED TO, READ BACK BY AND VERIFIED WITH: HANK ZOMPA 09/05/16 1128 SGD    Klebsiella oxytoca NOT DETECTED NOT DETECTED Final   Klebsiella pneumoniae NOT DETECTED NOT DETECTED Final   Proteus species NOT DETECTED NOT DETECTED Final   Serratia marcescens NOT DETECTED NOT  DETECTED Final   Carbapenem resistance NOT DETECTED NOT DETECTED Final   Haemophilus influenzae NOT DETECTED NOT DETECTED Final   Neisseria meningitidis NOT DETECTED NOT DETECTED Final   Pseudomonas aeruginosa NOT DETECTED NOT DETECTED Final   Candida albicans NOT DETECTED NOT DETECTED Final   Candida glabrata NOT DETECTED NOT DETECTED Final   Candida krusei NOT DETECTED NOT DETECTED Final   Candida parapsilosis NOT DETECTED NOT DETECTED Final   Candida tropicalis NOT DETECTED NOT DETECTED Final  MRSA PCR Screening     Status: None   Collection Time: 09/04/16 11:14  PM  Result Value Ref Range Status   MRSA by PCR NEGATIVE NEGATIVE Final    Comment:        The GeneXpert MRSA Assay (FDA approved for NASAL specimens only), is one component of a comprehensive MRSA colonization surveillance program. It is not intended to diagnose MRSA infection nor to guide or monitor treatment for MRSA infections.     Coagulation Studies: No results for input(s): LABPROT, INR in the last 72 hours.  Urinalysis: No results for input(s): COLORURINE, LABSPEC, PHURINE, GLUCOSEU, HGBUR, BILIRUBINUR, KETONESUR, PROTEINUR, UROBILINOGEN, NITRITE, LEUKOCYTESUR in the last 72 hours.  Invalid input(s): APPERANCEUR    Imaging: No results found.   Medications:    . allopurinol  100 mg Oral Daily  . aspirin EC  81 mg Oral Daily  . atorvastatin  40 mg Oral q1800  . ciprofloxacin  500 mg Oral Q1500  . heparin  5,000 Units Subcutaneous Q8H  . insulin aspart  0-5 Units Subcutaneous QHS  . insulin aspart  0-9 Units Subcutaneous TID WC  . levETIRAcetam  1,000 mg Oral Daily  . levETIRAcetam  250 mg Oral Q T,Th,Sa-HD  . pantoprazole  40 mg Oral Daily   ondansetron **OR** ondansetron (ZOFRAN) IV  Assessment/ Plan:  [name] is a 66 y.o. Asian ([place]) male with hypertension, coronary artery disease status post CABG, hyperlipidemia, gout, diabetes mellitus type II, End stage renal disease with  history of renal transplant on hemodialysis.   TTS [facility]  1. End Stage Renal Disease with hyperkalemia: TTS schedule. Scheduled dialysis today instead of Tuesday due to multiple procedures planned for tomorrow.  2.  Secondary Hyperparathyroidism: with hyperphosphatemia. Not currently on binders.   3. Anemia of chronic kidney disease: hemoglobin 12.2. Hold EPO  4. Common bile duct stone with Escherichia coli bacteremia. ERCP and cholecystectomy planned in the next few days     LOS: 5 [name] 3/26/20184:56 PM

## 2016-09-09 NOTE — Progress Notes (Signed)
Post HD assessment unchanged  

## 2016-09-09 NOTE — Progress Notes (Signed)
[facility] [place] NAME: Duwan Adrian    MR#:  [id number]  DATE OF BIRTH:  June 26, 1950  SUBJECTIVE:   Patient hungry , on liquids. Likely Going for ERCP in am  Seen during HD today.  REVIEW OF SYSTEMS:    Review of Systems  Constitutional: Negative for chills, fever and malaise/fatigue.  HENT: Negative.  Negative for ear discharge, ear pain, hearing loss, nosebleeds and sore throat.   Eyes: Negative.  Negative for blurred vision and pain.  Respiratory: Negative.  Negative for cough, hemoptysis, shortness of breath and wheezing.   Cardiovascular: Negative.  Negative for chest pain, palpitations and leg swelling.  Gastrointestinal: Negative for abdominal pain, blood in stool, diarrhea, nausea and vomiting.  Genitourinary: Negative.  Negative for dysuria.  Musculoskeletal: Negative.  Negative for back pain.  Skin: Negative.   Neurological: Negative for dizziness, tremors, speech change, focal weakness, seizures, weakness (better) and headaches.  Endo/Heme/Allergies: Negative.  Does not bruise/bleed easily.  Psychiatric/Behavioral: Negative.  Negative for depression, hallucinations and suicidal ideas.   Tolerating Diet: yes  DRUG ALLERGIES:   Allergies  Allergen Reactions  . Ivp Dye [Iodinated Diagnostic Agents] Other (See Comments)    Pt denied    VITALS:  Blood pressure (!) 111/55, pulse 74, temperature 97.7 F (36.5 C), temperature source Oral, resp. rate 16, height 4\' 9"  (1.448 m), weight 70.9 kg (156 lb 4.9 oz), SpO2 100 %.  PHYSICAL EXAMINATION:   Physical Exam  Constitutional: He is oriented to person, place, and time and well-developed, well-nourished, and in no distress. No distress.  HENT:  Head: Normocephalic.  Eyes: No scleral icterus.  Neck: Normal range of motion. Neck supple. No JVD present. No tracheal deviation present.  Cardiovascular: Normal rate, regular rhythm and normal heart sounds.  Exam reveals no gallop  and no friction rub.   No murmur heard. Pulmonary/Chest: Effort normal and breath sounds normal. No respiratory distress. He has no wheezes. He has no rales. He exhibits no tenderness.  Abdominal: Soft. Bowel sounds are normal. He exhibits no distension and no mass. There is no tenderness. There is no rebound and no guarding.  Musculoskeletal: Normal range of motion. He exhibits no edema.  Neurological: He is alert and oriented to person, place, and time.  Skin: Skin is warm. No rash noted. No erythema.  Psychiatric: Affect and judgment normal.      LABORATORY PANEL:   CBC  Recent Labs Lab 09/09/16 0401  WBC 6.7  HGB 12.2*  HCT 38.4*  PLT 235   ------------------------------------------------------------------------------------------------------------------  Chemistries   Recent Labs Lab 09/09/16 0401  NA 139  K 5.0  CL 96*  CO2 28  GLUCOSE 122*  BUN 39*  CREATININE 9.82*  CALCIUM 8.5*  AST 41  ALT 86*  ALKPHOS 241*  BILITOT 1.5*   ------------------------------------------------------------------------------------------------------------------  Cardiac Enzymes  Recent Labs Lab 09/04/16 1834 09/05/16 0104 09/05/16 0607  TROPONINI 0.03* 0.03* 0.03*   ------------------------------------------------------------------------------------------------------------------  RADIOLOGY:  No results found.   ASSESSMENT AND PLAN:   66 year old male with ESRD on hemodialysis, diabetes and essential hypertension who presents with sepsis.  1. Escherichia coli Sepsis present on admission: Patient presented with tachycardia, tachypnea and leukocytosis Sepsis from pneumonia and Escherichia coli bacteremia from Probable choledocholithiasis Appreciate ID consult Continue oral ciprofloxacin as per sensitivities needs 14 days of antibiotics total.   2. Cholangitis with Elevated LFTs with chronic elevation in LFTs: LFTs have improved  MRCP shows choledocholithiasis   Patient will  have ERCP in and then will need cholecystectomy  appreciate GI and surgery consultation   3. ESRD on hemodialysis: Continue dialysis as per nephrology   4. Hyperkalemia: Treated with insulin and dextrose and hemodialysis This has  resolved   4. Diabetes: Continue sliding scale insulin 5. Chronic diastolic heart failure : no evidence of exacerbation   Management plans discussed with the patient and he is in agreement.  CODE STATUS: full  TOTAL TIME TAKING CARE OF THIS PATIENT: 30 minutes.     POSSIBLE D/C 2-3  days, DEPENDING ON CLINICAL CONDITION.   Vaughan Basta M.D on 09/09/2016 at 9:18 PM  Between 7am to 6pm - Pager - [phone] After 6pm go to [email or web address] - password [email or web address] [facility]  Office  [phone]  CC: Primary care physician; [name], MD  Note: This dictation was prepared with Dragon dictation along with smaller phrase technology. Any transcriptional errors that result from this process are unintentional.

## 2016-09-09 NOTE — Progress Notes (Signed)
HD completed. UF=0 as ordered. Hemostasis achieved. Bandages clean dry and intact. Patient tolerated well.

## 2016-09-10 ENCOUNTER — Inpatient Hospital Stay: Payer: Medicare Other | Admitting: Anesthesiology

## 2016-09-10 ENCOUNTER — Encounter
Admission: EM | Disposition: A | Discharge status: Home / Self Care | Payer: Self-pay | Source: Home / Self Care | Attending: Internal Medicine

## 2016-09-10 ENCOUNTER — Encounter: Payer: Self-pay | Admitting: Anesthesiology

## 2016-09-10 DIAGNOSIS — K8051 Calculus of bile duct without cholangitis or cholecystitis with obstruction: Secondary | ICD-10-CM

## 2016-09-10 DIAGNOSIS — A4151 Sepsis due to Escherichia coli [E. coli]: Principal | ICD-10-CM

## 2016-09-10 HISTORY — PX: ERCP: SHX5425

## 2016-09-10 LAB — COMPREHENSIVE METABOLIC PANEL
ALK PHOS: 209 U/L — AB (ref 38–126)
ALT: 68 U/L — AB (ref 17–63)
AST: 43 U/L — AB (ref 15–41)
Albumin: 3.6 g/dL (ref 3.5–5.0)
Anion gap: 15 (ref 5–15)
BUN: 24 mg/dL — AB (ref 6–20)
CHLORIDE: 94 mmol/L — AB (ref 101–111)
CO2: 28 mmol/L (ref 22–32)
CREATININE: 7.78 mg/dL — AB (ref 0.61–1.24)
Calcium: 8.4 mg/dL — ABNORMAL LOW (ref 8.9–10.3)
GFR, EST AFRICAN AMERICAN: 7 mL/min — AB (ref >60)
GFR, EST NON AFRICAN AMERICAN: 6 mL/min — AB (ref >60)
Glucose, Bld: 106 mg/dL — ABNORMAL HIGH (ref 65–99)
Potassium: 4.7 mmol/L (ref 3.5–5.1)
Sodium: 137 mmol/L (ref 135–145)
Total Bilirubin: 1.6 mg/dL — ABNORMAL HIGH (ref 0.3–1.2)
Total Protein: 8.1 g/dL (ref 6.5–8.1)

## 2016-09-10 LAB — GLUCOSE, CAPILLARY
GLUCOSE-CAPILLARY: 114 mg/dL — AB (ref 65–99)
GLUCOSE-CAPILLARY: 158 mg/dL — AB (ref 65–99)
Glucose-Capillary: 187 mg/dL — ABNORMAL HIGH (ref 65–99)
Glucose-Capillary: 103 mg/dL — ABNORMAL HIGH (ref 65–99)

## 2016-09-10 LAB — LIPASE, BLOOD: Lipase: 47 U/L (ref 11–51)

## 2016-09-10 SURGERY — ERCP, WITH INTERVENTION IF INDICATED
Anesthesia: General

## 2016-09-10 SURGERY — ENDOSCOPIC RETROGRADE CHOLANGIOPANCREATOGRAPHY (ERCP) WITH PROPOFOL
Anesthesia: Monitor Anesthesia Care

## 2016-09-10 MED ORDER — INDOMETHACIN 50 MG RE SUPP
100.0000 mg | Freq: Once | RECTAL | Status: AC
  Administered 2016-09-10: 100 mg via RECTAL

## 2016-09-10 MED ORDER — CIPROFLOXACIN IN D5W 400 MG/200ML IV SOLN
400.0000 mg | INTRAVENOUS | Status: DC
  Administered 2016-09-10 – 2016-09-13 (×4): 400 mg via INTRAVENOUS
  Filled 2016-09-10 (×5): qty 200

## 2016-09-10 MED ORDER — PROPOFOL 500 MG/50ML IV EMUL
INTRAVENOUS | Status: AC
  Filled 2016-09-10: qty 50

## 2016-09-10 MED ORDER — INDOMETHACIN 50 MG RE SUPP: 100.0000 mg | Freq: Once | RECTAL | Status: DC

## 2016-09-10 MED ORDER — PROPOFOL 10 MG/ML IV BOLUS
INTRAVENOUS | Status: DC | PRN
  Administered 2016-09-10: 90 mg via INTRAVENOUS

## 2016-09-10 MED ORDER — PHENYLEPHRINE 40 MCG/ML (10ML) SYRINGE FOR IV PUSH (FOR BLOOD PRESSURE SUPPORT)
PREFILLED_SYRINGE | INTRAVENOUS | Status: DC | PRN
  Administered 2016-09-10 (×3): 100 ug via INTRAVENOUS

## 2016-09-10 MED ORDER — GLUCAGON HCL RDNA (DIAGNOSTIC) 1 MG IJ SOLR
INTRAMUSCULAR | Status: DC | PRN
  Administered 2016-09-10: 1 mg via INTRAVENOUS

## 2016-09-10 MED ORDER — SODIUM CHLORIDE 0.9 % IV SOLN
INTRAVENOUS | Status: DC
  Administered 2016-09-10: 10:00:00 via INTRAVENOUS

## 2016-09-10 MED ORDER — MORPHINE SULFATE (PF) 2 MG/ML IV SOLN
1.0000 mg | INTRAVENOUS | Status: DC | PRN
  Administered 2016-09-10 – 2016-09-14 (×3): 1 mg via INTRAVENOUS
  Filled 2016-09-10 (×3): qty 1

## 2016-09-10 MED ORDER — DIPHENHYDRAMINE HCL 50 MG/ML IJ SOLN
INTRAMUSCULAR | Status: DC | PRN
  Administered 2016-09-10: 50 mg via INTRAVENOUS

## 2016-09-10 MED ORDER — GLUCAGON HCL RDNA (DIAGNOSTIC) 1 MG IJ SOLR
INTRAMUSCULAR | Status: AC
  Filled 2016-09-10: qty 1

## 2016-09-10 MED ORDER — OXYCODONE HCL 5 MG PO TABS
5.0000 mg | ORAL_TABLET | ORAL | Status: DC | PRN
  Administered 2016-09-10 – 2016-09-13 (×3): 5 mg via ORAL
  Administered 2016-09-13: 10 mg via ORAL
  Administered 2016-09-14: 5 mg via ORAL
  Filled 2016-09-10 (×6): qty 1

## 2016-09-10 MED ORDER — DIPHENHYDRAMINE HCL 50 MG/ML IJ SOLN
INTRAMUSCULAR | Status: AC
  Filled 2016-09-10: qty 1

## 2016-09-10 MED ORDER — PROPOFOL 500 MG/50ML IV EMUL
INTRAVENOUS | Status: DC | PRN
  Administered 2016-09-10: 100 ug/kg/min via INTRAVENOUS

## 2016-09-10 NOTE — Progress Notes (Signed)
Patient had the ERCP with stone extraction today he has a stent in place. Discussed with Dr. Allen Norris and results reviewed personally.  Patient seen on the floor in his room. He has no complaints at this time.  No icterus no jaundice abdomen is soft nontender.  I discussed with the patient the need for laparoscopic cholecystectomy with cholangiography. This should be done tomorrow and he will be posted in the operating room for Dr. Dahlia Byes. The rationale for offering surgery at this point was discussed with him he understood and agreed to proceed. Family member present in the room but did not wake up.

## 2016-09-10 NOTE — Progress Notes (Signed)
[facility]  ROUNDING NOTE   Subjective:   Patient is doing  Fair No nausea or vomiting reported Tolerated HD yesteday    Objective:  Vital signs in last 24 hours:  Temp:  [97.5 F (36.4 C)-98.3 F (36.8 C)] 98.3 F (36.8 C) (03/27 1318) Pulse Rate:  [72-84] 72 (03/27 1318) Resp:  [16-43] 18 (03/27 1318) BP: (70-131)/(49-79) 126/74 (03/27 1318) SpO2:  [97 %-100 %] 100 % (03/27 1318) Weight:  [69.3 kg (152 lb 12.8 oz)] 69.3 kg (152 lb 12.8 oz) (03/27 0500)  Weight change:  Filed Weights   09/09/16 1333 09/09/16 1637 09/10/16 0500  Weight: 70.9 kg (156 lb 3.2 oz) 70.9 kg (156 lb 4.9 oz) 69.3 kg (152 lb 12.8 oz)    Intake/Output: I/O last 3 completed shifts: In: 240 [P.O.:240] Out: 0    Intake/Output this shift:  Total I/O In: 200 [I.V.:200] Out: -   Physical Exam: General: NAD,   Head: Normocephalic, atraumatic. Moist oral mucosal membranes  ENT: moist mucus membranes  Neck: Supple, trachea midline  Lungs:  Clear to auscultation  Heart: Regular rate and rhythm  Abdomen:  Soft, nontender,   Extremities: no peripheral edema.  Neurologic: Nonfocal, moving all four extremities  Skin: No lesions  Access: Left AVF    Basic Metabolic Panel:  Recent Labs Lab 09/06/16 0509 09/07/16 0330 09/08/16 0751 09/09/16 0401 09/10/16 0601  NA 140 140 141 139 137  K 4.8 4.5 5.3* 5.0 4.7  CL 98* 98* 97* 96* 94*  CO2 29 30 28 28 28   GLUCOSE 114* 129* 85 122* 106*  BUN 28* 39* 26* 39* 24*  CREATININE 7.37* 10.00* 8.07* 9.82* 7.78*  CALCIUM 8.1* 8.1* 8.8* 8.5* 8.4*    Liver Function Tests:  Recent Labs Lab 09/06/16 0509 09/07/16 0330 09/08/16 0751 09/09/16 0401 09/10/16 0601  AST 160* 89* 55* 41 43*  ALT 208* 151* 113* 86* 68*  ALKPHOS 257* 257* 238* 241* 209*  BILITOT 3.1* 1.7* 1.9* 1.5* 1.6*  PROT 7.1 7.5 8.2* 8.1 8.1  ALBUMIN 3.2* 3.2* 3.5 3.5 3.6    Recent Labs Lab 09/04/16 1834 09/10/16 0601  LIPASE 50 47    Recent Labs Lab  09/04/16 1834  AMMONIA 34    CBC:  Recent Labs Lab 09/04/16 1834 09/05/16 0607 09/07/16 0330 09/08/16 0751 09/09/16 0401  WBC 10.0 17.1* 8.0 6.8 6.7  NEUTROABS 8.9*  --   --   --   --   HGB 13.4 11.5* 11.0* 12.1* 12.2*  HCT 42.9 36.8* 34.9* 38.5* 38.4*  MCV 74.7* 75.1* 75.0* 74.9* 73.9*  PLT 241 223 194 222 235    Cardiac Enzymes:  Recent Labs Lab 09/04/16 1834 09/05/16 0104 09/05/16 0607  TROPONINI 0.03* 0.03* 0.03*    BNP: Invalid input(s): POCBNP  CBG:  Recent Labs Lab 09/09/16 1652 09/09/16 2113 09/10/16 0739 09/10/16 1205 09/10/16 1643  GLUCAP 100* 113* 114* 187* 103*    Microbiology: Results for orders placed or performed during the hospital encounter of 09/04/16  Blood culture (routine x 2)     Status: Abnormal   Collection Time: 09/04/16  9:01 PM  Result Value Ref Range Status   Specimen Description BLOOD RIGHT FOREARM  Final   Special Requests BOTTLES DRAWN AEROBIC AND ANAEROBIC  [place]  Final   Culture  Setup Time   Final    GRAM NEGATIVE RODS IN BOTH AEROBIC AND ANAEROBIC BOTTLES CRITICAL RESULT CALLED TO, READ BACK BY AND VERIFIED WITH: HANK ZOMPA 09/05/16 1128  SGD    Culture (A)  Final    ESCHERICHIA COLI SUSCEPTIBILITIES PERFORMED ON PREVIOUS CULTURE WITHIN THE LAST 5 DAYS. Performed at [facility], [place] [address], [place], [place] [address]    Report Status 09/07/2016 FINAL  Final  Blood culture (routine x 2)     Status: Abnormal   Collection Time: 09/04/16  9:01 PM  Result Value Ref Range Status   Specimen Description BLOOD RIGHT ANTECUBITAL  Final   Special Requests BOTTLES DRAWN AEROBIC AND ANAEROBIC  [facility]  Final   Culture  Setup Time   Final    GRAM NEGATIVE RODS IN BOTH AEROBIC AND ANAEROBIC BOTTLES CRITICAL RESULT CALLED TO, READ BACK BY AND VERIFIED WITH: HANK ZOMPA 09/05/16 1128 SGD Performed at [facility], [place] [address], [place], [place] [address]    Culture ESCHERICHIA COLI (A)  Final   Report Status  09/07/2016 FINAL  Final   Organism ID, Bacteria ESCHERICHIA COLI  Final      Susceptibility   Escherichia coli - MIC*    AMPICILLIN <=2 SENSITIVE Sensitive     CEFAZOLIN <=4 SENSITIVE Sensitive     CEFEPIME <=1 SENSITIVE Sensitive     CEFTAZIDIME <=1 SENSITIVE Sensitive     CEFTRIAXONE <=1 SENSITIVE Sensitive     CIPROFLOXACIN <=0.25 SENSITIVE Sensitive     GENTAMICIN <=1 SENSITIVE Sensitive     IMIPENEM <=0.25 SENSITIVE Sensitive     TRIMETH/SULFA <=20 SENSITIVE Sensitive     AMPICILLIN/SULBACTAM <=2 SENSITIVE Sensitive     PIP/TAZO <=4 SENSITIVE Sensitive     Extended ESBL NEGATIVE Sensitive     * ESCHERICHIA COLI  Blood Culture ID Panel (Reflexed)     Status: Abnormal   Collection Time: 09/04/16  9:01 PM  Result Value Ref Range Status   Enterococcus species NOT DETECTED NOT DETECTED Final   Listeria monocytogenes NOT DETECTED NOT DETECTED Final   Staphylococcus species NOT DETECTED NOT DETECTED Final   Staphylococcus aureus NOT DETECTED NOT DETECTED Final   Streptococcus species NOT DETECTED NOT DETECTED Final   Streptococcus agalactiae NOT DETECTED NOT DETECTED Final   Streptococcus pneumoniae NOT DETECTED NOT DETECTED Final   Streptococcus pyogenes NOT DETECTED NOT DETECTED Final   Acinetobacter baumannii NOT DETECTED NOT DETECTED Final   Enterobacteriaceae species DETECTED (A) NOT DETECTED Final    Comment: Enterobacteriaceae represent a large family of gram-negative bacteria, not a single organism. CRITICAL RESULT CALLED TO, READ BACK BY AND VERIFIED WITH: HANK ZOMPA 09/05/16 1128 SGD    Enterobacter cloacae complex NOT DETECTED NOT DETECTED Final   Escherichia coli DETECTED (A) NOT DETECTED Final    Comment: CRITICAL RESULT CALLED TO, READ BACK BY AND VERIFIED WITH: HANK ZOMPA 09/05/16 1128 SGD    Klebsiella oxytoca NOT DETECTED NOT DETECTED Final   Klebsiella pneumoniae NOT DETECTED NOT DETECTED Final   Proteus species NOT DETECTED NOT DETECTED Final   Serratia  marcescens NOT DETECTED NOT DETECTED Final   Carbapenem resistance NOT DETECTED NOT DETECTED Final   Haemophilus influenzae NOT DETECTED NOT DETECTED Final   Neisseria meningitidis NOT DETECTED NOT DETECTED Final   Pseudomonas aeruginosa NOT DETECTED NOT DETECTED Final   Candida albicans NOT DETECTED NOT DETECTED Final   Candida glabrata NOT DETECTED NOT DETECTED Final   Candida krusei NOT DETECTED NOT DETECTED Final   Candida parapsilosis NOT DETECTED NOT DETECTED Final   Candida tropicalis NOT DETECTED NOT DETECTED Final  MRSA PCR Screening     Status: None  Collection Time: 09/04/16 11:14 PM  Result Value Ref Range Status   MRSA by PCR NEGATIVE NEGATIVE Final    Comment:        The GeneXpert MRSA Assay (FDA approved for NASAL specimens only), is one component of a comprehensive MRSA colonization surveillance program. It is not intended to diagnose MRSA infection nor to guide or monitor treatment for MRSA infections.     Coagulation Studies: No results for input(s): LABPROT, INR in the last 72 hours.  Urinalysis: No results for input(s): COLORURINE, LABSPEC, PHURINE, GLUCOSEU, HGBUR, BILIRUBINUR, KETONESUR, PROTEINUR, UROBILINOGEN, NITRITE, LEUKOCYTESUR in the last 72 hours.  Invalid input(s): APPERANCEUR    Imaging: No results found.   Medications:    . allopurinol  100 mg Oral Daily  . aspirin EC  81 mg Oral Daily  . atorvastatin  40 mg Oral q1800  . ciprofloxacin  400 mg Intravenous Q24H  . heparin  5,000 Units Subcutaneous Q8H  . indomethacin  100 mg Rectal Once  . indomethacin  100 mg Rectal Once  . insulin aspart  0-5 Units Subcutaneous QHS  . insulin aspart  0-9 Units Subcutaneous TID WC  . levETIRAcetam  1,000 mg Oral Daily  . levETIRAcetam  250 mg Oral Q T,Th,Sa-HD  . pantoprazole  40 mg Oral Daily   morphine injection, ondansetron **OR** ondansetron (ZOFRAN) IV  Assessment/ Plan:  [name] is a 66 y.o. Asian ([place]) male with  hypertension, coronary artery disease status post CABG, hyperlipidemia, gout, diabetes mellitus type II, End stage renal disease with history of renal transplant on hemodialysis.   TTS [facility]  1. End Stage Renal Disease with hyperkalemia: TTS schedule. Next HD on thursday  2.  Secondary Hyperparathyroidism: with hyperphosphatemia. Not currently on binders.   3. Anemia of chronic kidney disease:  hemoglobin 12.2. Hold EPO  4. Common bile duct stone with Escherichia coli bacteremia. ERCP and cholecystectomy planned       LOS: 6 Lynley Killilea 3/27/20185:26 PM

## 2016-09-10 NOTE — Progress Notes (Signed)
CC: Biliary pancreatitis Subjective: This patient with biliary pancreatitis and common bile duct stone. He is scheduled for ERCP. I discussed his case with Dr. Allen Norris. Patient has no pain today no nausea or vomiting no fevers or chills Objective: Vital signs in last 24 hours: Temp:  [97 F (36.1 C)-97.7 F (36.5 C)] 97.5 F (36.4 C) (03/27 0542) Pulse Rate:  [65-83] 78 (03/27 0542) Resp:  [15-18] 17 (03/27 0542) BP: (111-160)/(55-102) 126/72 (03/27 0542) SpO2:  [98 %-100 %] 98 % (03/27 0542) Weight:  [152 lb 12.8 oz (69.3 kg)-156 lb 4.9 oz (70.9 kg)] 152 lb 12.8 oz (69.3 kg) (03/27 0500) Last BM Date: 09/09/16 (previously charted 3/21 in error)  Intake/Output from previous day: 03/26 0701 - 03/27 0700 In: 240 [P.O.:240] Out: 0  Intake/Output this shift: No intake/output data recorded.  Physical exam:  Abdomen is soft and nontender no icterus no jaundice nontender calves   Lab Results: CBC   Recent Labs  09/08/16 0751 09/09/16 0401  WBC 6.8 6.7  HGB 12.1* 12.2*  HCT 38.5* 38.4*  PLT 222 235   BMET  Recent Labs  09/09/16 0401 09/10/16 0601  NA 139 137  K 5.0 4.7  CL 96* 94*  CO2 28 28  GLUCOSE 122* 106*  BUN 39* 24*  CREATININE 9.82* 7.78*  CALCIUM 8.5* 8.4*   PT/INR No results for input(s): LABPROT, INR in the last 72 hours. ABG No results for input(s): PHART, HCO3 in the last 72 hours.  Invalid input(s): PCO2, PO2  Studies/Results: No results found.  Anti-infectives: Anti-infectives    Start     Dose/Rate Route Frequency Ordered Stop   09/07/16 1500  ciprofloxacin (CIPRO) tablet 500 mg     500 mg Oral Daily 09/07/16 1058     09/06/16 1800  meropenem (MERREM) IVPB SOLR 500 mg  Status:  Discontinued     500 mg 100 mL/hr over 30 Minutes Intravenous Every 24 hours 09/06/16 1625 09/09/16 0953   09/05/16 1800  ceFEPIme (MAXIPIME) 1 g in dextrose 5 % 50 mL IVPB  Status:  Discontinued     1 g 100 mL/hr over 30 Minutes Intravenous Every 24 hours  09/04/16 2237 09/06/16 1613   09/04/16 2330  ceFEPIme (MAXIPIME) 1 g in dextrose 5 % 50 mL IVPB     1 g 100 mL/hr over 30 Minutes Intravenous  Once 09/04/16 2237 09/05/16 0243   09/04/16 2300  vancomycin (VANCOCIN) 1,500 mg in sodium chloride 0.9 % 500 mL IVPB     1,500 mg 250 mL/hr over 120 Minutes Intravenous  Once 09/04/16 2213 09/05/16 0124   09/04/16 2237  vancomycin (VANCOCIN) IVPB 750 mg/150 ml premix  Status:  Discontinued     750 mg 150 mL/hr over 60 Minutes Intravenous Every Dialysis 09/04/16 2237 09/05/16 9935      Assessment/Plan:  Patient doing well awaiting ERCP. Discussed with Dr. Allen Norris. Plan laparoscopic cholecystectomy after ERCP.  Florene Glen, MD, FACS  09/10/2016

## 2016-09-10 NOTE — Progress Notes (Signed)
Pharmacy Antibiotic Note  [name] is a 66 y.o. male admitted on 09/04/2016 with pneumonia and E.Coli bacteremia. Patient on meropenem and cefepime. Culture results showing sensitivities to ciprofloxacin. Pharmacy  consulted for Cipro dosing.  Plan: Will continue Ciprofloxacin 500mg  Daily, to be given after dialysis on dialysis days.   Height: 4\' 9"  (144.8 cm) Weight: 152 lb 12.8 oz (69.3 kg) IBW/kg (Calculated) : 43.1  Temp (24hrs), Avg:97.7 F (36.5 C), Min:97 F (36.1 C), Max:98.3 F (36.8 C)   Recent Labs Lab 09/04/16 1834 09/04/16 2101 09/05/16 0104 09/05/16 0607 09/06/16 0509 09/06/16 1152 09/07/16 0330 09/08/16 0751 09/09/16 0401 09/10/16 0601  WBC 10.0  --   --  17.1*  --   --  8.0 6.8 6.7  --   CREATININE 10.77*  --   --  11.40* 7.37*  --  10.00* 8.07* 9.82* 7.78*  LATICACIDVEN 4.5* 5.0* 5.7*  --  0.9  --   --   --   --   --   VANCORANDOM  --   --   --   --   --  18  --   --   --   --     Estimated Creatinine Clearance: 7.2 mL/min (A) (by C-G formula based on SCr of 7.78 mg/dL (H)).    Allergies  Allergen Reactions  . Ivp Dye [Iodinated Diagnostic Agents] Other (See Comments)    Pt denied    Antimicrobials this admission: Cefepime:  3/22 >> Meropenem 3/23 >>  Dose adjustments this admission:  Microbiology results: 3/21 BCx: Ecoli 3/21 MRSA PCR: negative   Scotty Pinder D, PharmD, BCPS Clinical Pharmacist 09/10/2016 1:49 PM

## 2016-09-10 NOTE — Anesthesia Post-op Follow-up Note (Cosign Needed)
Anesthesia QCDR form completed.        

## 2016-09-10 NOTE — Progress Notes (Signed)
ERCP nurse was notified that patient  had heparin subcutaneous at 5:33 this am.

## 2016-09-10 NOTE — Transfer of Care (Signed)
Immediate Anesthesia Transfer of Care Note  Patient: [name]  Procedure(s) Performed: Procedure(s): ENDOSCOPIC RETROGRADE CHOLANGIOPANCREATOGRAPHY (ERCP) (N/A)  Patient Location: PACU and Endoscopy Unit  Anesthesia Type:General  Level of Consciousness: sedated and responds to stimulation  Airway & Oxygen Therapy: Patient Spontanous Breathing and Patient connected to nasal cannula oxygen  Post-op Assessment: Report given to RN and Post -op Vital signs reviewed and stable  Post vital signs: Reviewed and stable  Last Vitals:  Vitals:   09/10/16 1104 09/10/16 1105  BP: (!) 70/49 (!) 81/67  Pulse: 78 78  Resp: (!) 25 (!) 22  Temp: 36.7 C     Last Pain:  Vitals:   09/10/16 1104  TempSrc: Tympanic  PainSc:          Complications: No apparent anesthesia complications

## 2016-09-10 NOTE — Progress Notes (Signed)
ID brief  note S/p ERCP. Planned for chole No fevers Cont  current abx - cipro-  Will change to IV since is NPo for procedures

## 2016-09-10 NOTE — Anesthesia Postprocedure Evaluation (Signed)
Anesthesia Post Note  Patient: [name]  Procedure(s) Performed: Procedure(s) (LRB): ENDOSCOPIC RETROGRADE CHOLANGIOPANCREATOGRAPHY (ERCP) (N/A)  Patient location during evaluation: Endoscopy Anesthesia Type: General Level of consciousness: awake and alert Pain management: pain level controlled Vital Signs Assessment: post-procedure vital signs reviewed and stable Respiratory status: spontaneous breathing, nonlabored ventilation, respiratory function stable and patient connected to nasal cannula oxygen Cardiovascular status: blood pressure returned to baseline and stable Postop Assessment: no signs of nausea or vomiting Anesthetic complications: no     Last Vitals:  Vitals:   09/10/16 1114 09/10/16 1120  BP: (!) 81/57 95/61  Pulse: 72 72  Resp: (!) 36 (!) 36  Temp:      Last Pain:  Vitals:   09/10/16 1104  TempSrc: Tympanic  PainSc:                  Martha Clan

## 2016-09-10 NOTE — Progress Notes (Signed)
[place] [facility] [place] NAME: [name]    MR#:  [id number]  DATE OF BIRTH:  June 01, 1951  SUBJECTIVE:   Bacteremia, Choledocholithiasys. s/p ERCp today 09/10/16- stent placed in CBD.  Plan for lap chole tomorrow.  REVIEW OF SYSTEMS:    Review of Systems  Constitutional: Negative for chills, fever and malaise/fatigue.  HENT: Negative.  Negative for ear discharge, ear pain, hearing loss, nosebleeds and sore throat.   Eyes: Negative.  Negative for blurred vision and pain.  Respiratory: Negative.  Negative for cough, hemoptysis, shortness of breath and wheezing.   Cardiovascular: Negative.  Negative for chest pain, palpitations and leg swelling.  Gastrointestinal: Negative for abdominal pain, blood in stool, diarrhea, nausea and vomiting.  Genitourinary: Negative.  Negative for dysuria.  Musculoskeletal: Negative.  Negative for back pain.  Skin: Negative.   Neurological: Negative for dizziness, tremors, speech change, focal weakness, seizures, weakness (better) and headaches.  Endo/Heme/Allergies: Negative.  Does not bruise/bleed easily.  Psychiatric/Behavioral: Negative.  Negative for depression, hallucinations and suicidal ideas.   Tolerating Diet: yes  DRUG ALLERGIES:   Allergies  Allergen Reactions  . Ivp Dye [Iodinated Diagnostic Agents] Other (See Comments)    Pt denied    VITALS:  Blood pressure 117/68, pulse 71, temperature 97.9 F (36.6 C), temperature source Oral, resp. rate 16, height 4\' 9"  (1.448 m), weight 69.3 kg (152 lb 12.8 oz), SpO2 100 %.  PHYSICAL EXAMINATION:   Physical Exam  Constitutional: He is oriented to person, place, and time and well-developed, well-nourished, and in no distress. No distress.  HENT:  Head: Normocephalic.  Eyes: No scleral icterus.  Neck: Normal range of motion. Neck supple. No JVD present. No tracheal deviation present.  Cardiovascular: Normal rate, regular rhythm and normal heart sounds.   Exam reveals no gallop and no friction rub.   No murmur heard. Pulmonary/Chest: Effort normal and breath sounds normal. No respiratory distress. He has no wheezes. He has no rales. He exhibits no tenderness.  Abdominal: Soft. Bowel sounds are normal. He exhibits no distension and no mass. There is no tenderness. There is no rebound and no guarding.  Musculoskeletal: Normal range of motion. He exhibits no edema.  Neurological: He is alert and oriented to person, place, and time.  Skin: Skin is warm. No rash noted. No erythema.  Psychiatric: Affect and judgment normal.      LABORATORY PANEL:   CBC  Recent Labs Lab 09/09/16 0401  WBC 6.7  HGB 12.2*  HCT 38.4*  PLT 235   ------------------------------------------------------------------------------------------------------------------  Chemistries   Recent Labs Lab 09/10/16 0601  NA 137  K 4.7  CL 94*  CO2 28  GLUCOSE 106*  BUN 24*  CREATININE 7.78*  CALCIUM 8.4*  AST 43*  ALT 68*  ALKPHOS 209*  BILITOT 1.6*   ------------------------------------------------------------------------------------------------------------------  Cardiac Enzymes  Recent Labs Lab 09/04/16 1834 09/05/16 0104 09/05/16 0607  TROPONINI 0.03* 0.03* 0.03*   ------------------------------------------------------------------------------------------------------------------  RADIOLOGY:  No results found.   ASSESSMENT AND PLAN:   66 year old male with ESRD on hemodialysis, diabetes and essential hypertension who presents with sepsis.  1. Escherichia coli Sepsis present on admission: Patient presented with tachycardia, tachypnea and leukocytosis Sepsis from pneumonia and Escherichia coli bacteremia from Probable choledocholithiasis Appreciate ID consult Continue oral ciprofloxacin as per sensitivities needs 14 days of antibiotics total.  2. Cholangitis with Elevated LFTs with chronic elevation in LFTs: LFTs have improved  MRCP shows  choledocholithiasis  appreciate GI and surgery  consultation  ERCP with stent in CBD on 09/10/16 Plan for Laproscopic Cholecyctectomy on 09/11/16  3. ESRD on hemodialysis: Continue dialysis as per nephrology   4. Hyperkalemia: Treated with insulin and dextrose and hemodialysis This has  resolved   4. Diabetes: Continue sliding scale insulin 5. Chronic diastolic heart failure : no evidence of exacerbation   Management plans discussed with the patient and he is in agreement.  CODE STATUS: full  TOTAL TIME TAKING CARE OF THIS PATIENT: 30 minutes.   POSSIBLE D/C 2-3  days, DEPENDING ON CLINICAL CONDITION.   Vaughan Basta M.D on 09/10/2016 at 9:55 PM  Between 7am to 6pm - Pager - [phone] After 6pm go to [email or web address] - password [email or web address] [facility]  Office  [phone]  CC: Primary care physician; [name], MD  Note: This dictation was prepared with Dragon dictation along with smaller phrase technology. Any transcriptional errors that result from this process are unintentional.

## 2016-09-10 NOTE — Anesthesia Preprocedure Evaluation (Signed)
Anesthesia Evaluation  Patient identified by MRN, date of birth, ID band Patient awake    Reviewed: Allergy & Precautions, H&P , NPO status , Patient's Chart, lab work & pertinent test results, reviewed documented beta blocker date and time   History of Anesthesia Complications Negative for: history of anesthetic complications  Airway Mallampati: I  TM Distance: >3 FB Neck ROM: full    Dental  (+) Teeth Intact   Pulmonary shortness of breath, neg sleep apnea, neg COPD, neg recent URI, former smoker,           Cardiovascular Exercise Tolerance: Good hypertension, (-) angina+ CAD, + Past MI and +CHF  (-) dysrhythmias (-) Valvular Problems/Murmurs     Neuro/Psych negative neurological ROS  negative psych ROS   GI/Hepatic Neg liver ROS, GERD  ,  Endo/Other  diabetes  Renal/GU ESRF and DialysisRenal disease  negative genitourinary   Musculoskeletal   Abdominal   Peds  Hematology negative hematology ROS (+) Blood dyscrasia, anemia ,   Anesthesia Other Findings Past Medical History: No date: Chronic diastolic congestive heart failure (HC* No date: Chronic disease anemia No date: ESRD (end stage renal disease) on dialysis (HC*     Comment: "[place]; [place]; [name]" (09/29/2014) No date: GERD (gastroesophageal reflux disease) No date: Gout No date: High cholesterol No date: History of blood transfusion     Comment: "related to anemia" No date: History of stomach ulcers No date: Hypertension No date: Type II diabetes mellitus (HCC)   Reproductive/Obstetrics negative OB ROS                             Anesthesia Physical Anesthesia Plan  ASA: IV  Anesthesia Plan: General   Post-op Pain Management:    Induction:   Airway Management Planned:   Additional Equipment:   Intra-op Plan:   Post-operative Plan:   Informed Consent: I have reviewed the patients History and Physical,  chart, labs and discussed the procedure including the risks, benefits and alternatives for the proposed anesthesia with the patient or authorized representative who has indicated his/her understanding and acceptance.   Dental Advisory Given  Plan Discussed with: Anesthesiologist, CRNA and Surgeon  Anesthesia Plan Comments:         Anesthesia Quick Evaluation

## 2016-09-10 NOTE — Op Note (Addendum)
[facility] Gastroenterology Patient Name: [name] Procedure Date: 09/10/2016 9:44 AM MRN: [id number] Account #: [id number] Date of Birth: [birth year] Admit Type: Inpatient Age: 66 Room: [facility] ENDO ROOM 4 Gender: Male Note Status: Finalized Procedure:            ERCP Indications:          Bile duct stone(s) Providers:            [name] MD, MD Referring MD:         [name] (Referring MD) Medicines:            Propofol per Anesthesia Complications:        No immediate complications. Procedure:            Pre-Anesthesia Assessment:                       - Prior to the procedure, a History and Physical was                        performed, and patient medications and allergies were                        reviewed. The patient's tolerance of previous                        anesthesia was also reviewed. The risks and benefits of                        the procedure and the sedation options and risks were                        discussed with the patient. All questions were                        answered, and informed consent was obtained. Prior                        Anticoagulants: The patient has taken no previous                        anticoagulant or antiplatelet agents. ASA Grade                        Assessment: II - A patient with mild systemic disease.                        After reviewing the risks and benefits, the patient was                        deemed in satisfactory condition to undergo the                        procedure.                       After obtaining informed consent, the scope was passed                        under direct vision. Throughout the procedure, the  patient's blood pressure, pulse, and oxygen saturations                        were monitored continuously. The Endoscope was                        introduced through the mouth, and used to inject                        contrast into and used to  inject contrast into the bile                        duct and dorsal pancreatic duct. The ERCP was                        accomplished without difficulty. The patient tolerated                        the procedure well. Findings:      The scout film was normal. The esophagus was successfully intubated       under direct vision. The scope was advanced to a normal major papilla in       the descending duodenum without detailed examination of the pharynx,       larynx and associated structures, and upper GI tract. The upper GI tract       was grossly normal. , The major papilla was on the rim of a       diverticulum. A wire passed successfully into the body of the pancreatic       duct from the minor papilla. A wire was passed into the biliary tree.       The bile duct alone was deeply cannulated with the short-nosed traction       sphincterotome. Contrast was injected. I personally interpreted the bile       duct images. There was brisk flow of contrast through the ducts. Image       quality was excellent. Contrast extended to the hepatic ducts. The lower       third of the main bile duct contained stone(s) mm. One 7 Fr by 7 cm       plastic stent with a single external flap and a single internal flap was       placed 5 cm into the common bile duct. Bile flowed through the stent.       The stent was in good position. Impression:           This was due to patient being on heprin.                       - The major papilla was on the rim of a diverticulum.                       - Choledocholithiasis was found. Removal was not                        attempted; a stent was inserted.                       - One plastic stent was placed into the common bile  duct. Recommendation:       - Watch for pancreatitis, bleeding, perforation, and                        cholangitis.                       - Repeat ERCP in 1 month to remove stent. Procedure Code(s):    --- Professional  ---                       [phone], Endoscopic retrograde cholangiopancreatography                        (ERCP); with placement of endoscopic stent into biliary                        or pancreatic duct, including pre- and post-dilation                        and guide wire passage, when performed, including                        sphincterotomy, when performed, each stent                       [id number], Endoscopic catheterization of the biliary ductal                        system, radiological supervision and interpretation Diagnosis Code(s):    --- Professional ---                       K80.50, Calculus of bile duct without cholangitis or                        cholecystitis without obstruction CPT copyright 2016 American Medical Association. All rights reserved. The codes documented in this report are preliminary and upon coder review may  be revised to meet current compliance requirements. [name] MD, MD 09/10/2016 11:01:54 AM This report has been signed electronically. Number of Addenda: 0 Note Initiated On: 09/10/2016 9:44 AM      [facility]

## 2016-09-11 ENCOUNTER — Encounter
Admission: EM | Disposition: A | Discharge status: Home / Self Care | Payer: Self-pay | Source: Home / Self Care | Attending: Internal Medicine

## 2016-09-11 LAB — GLUCOSE, CAPILLARY
Glucose-Capillary: 119 mg/dL — ABNORMAL HIGH (ref 65–99)
Glucose-Capillary: 156 mg/dL — ABNORMAL HIGH (ref 65–99)
Glucose-Capillary: 136 mg/dL — ABNORMAL HIGH (ref 65–99)

## 2016-09-11 LAB — COMPREHENSIVE METABOLIC PANEL
ALT: 57 U/L (ref 17–63)
AST: 42 U/L — AB (ref 15–41)
Albumin: 3.6 g/dL (ref 3.5–5.0)
Alkaline Phosphatase: 203 U/L — ABNORMAL HIGH (ref 38–126)
Anion gap: 16 — ABNORMAL HIGH (ref 5–15)
BILIRUBIN TOTAL: 1.5 mg/dL — AB (ref 0.3–1.2)
BUN: 37 mg/dL — AB (ref 6–20)
CO2: 28 mmol/L (ref 22–32)
CREATININE: 10.13 mg/dL — AB (ref 0.61–1.24)
Calcium: 7.9 mg/dL — ABNORMAL LOW (ref 8.9–10.3)
Chloride: 94 mmol/L — ABNORMAL LOW (ref 101–111)
GFR, EST AFRICAN AMERICAN: 5 mL/min — AB (ref >60)
GFR, EST NON AFRICAN AMERICAN: 5 mL/min — AB (ref >60)
Glucose, Bld: 124 mg/dL — ABNORMAL HIGH (ref 65–99)
Potassium: 4.5 mmol/L (ref 3.5–5.1)
Sodium: 138 mmol/L (ref 135–145)
TOTAL PROTEIN: 8.3 g/dL — AB (ref 6.5–8.1)

## 2016-09-11 LAB — LIPASE, BLOOD: LIPASE: 4082 U/L — AB (ref 11–51)

## 2016-09-11 SURGERY — LAPAROSCOPIC CHOLECYSTECTOMY WITH INTRAOPERATIVE CHOLANGIOGRAM
Anesthesia: Choice

## 2016-09-11 MED ORDER — DEXTROSE-NACL 5-0.45 % IV SOLN
INTRAVENOUS | Status: AC
  Administered 2016-09-11: 13:00:00 via INTRAVENOUS

## 2016-09-11 NOTE — Progress Notes (Signed)
CC: CBD stone Subjective: S/P ERCP and developed significant pain after procedure/ Lipase > 4k.  Objective: Vital signs in last 24 hours: Temp:  [97.9 F (36.6 C)-98.3 F (36.8 C)] 97.9 F (36.6 C) (03/28 0531) Pulse Rate:  [71-84] 72 (03/28 0531) Resp:  [16-43] 16 (03/28 0531) BP: (70-126)/(49-74) 123/74 (03/28 0531) SpO2:  [98 %-100 %] 100 % (03/28 0531) Weight:  [69.1 kg (152 lb 4.8 oz)] 69.1 kg (152 lb 4.8 oz) (03/28 0531) Last BM Date: 09/10/16  Intake/Output from previous day: 03/27 0701 - 03/28 0700 In: 880 [P.O.:480; I.V.:200; IV Piggyback:200] Out: -  Intake/Output this shift: No intake/output data recorded.  Physical exam: NAD, non toxic Abd: ttp epigastric area,no peritonitis. Ext: well perfused and warm Neuro: awake and alert, no focal deficits Lab Results: CBC   Recent Labs  09/09/16 0401  WBC 6.7  HGB 12.2*  HCT 38.4*  PLT 235   BMET  Recent Labs  09/10/16 0601 09/11/16 0417  NA 137 138  K 4.7 4.5  CL 94* 94*  CO2 28 28  GLUCOSE 106* 124*  BUN 24* 37*  CREATININE 7.78* 10.13*  CALCIUM 8.4* 7.9*   PT/INR No results for input(s): LABPROT, INR in the last 72 hours. ABG No results for input(s): PHART, HCO3 in the last 72 hours.  Invalid input(s): PCO2, PO2  Studies/Results: No results found.  Anti-infectives: Anti-infectives    Start     Dose/Rate Route Frequency Ordered Stop   09/10/16 1500  ciprofloxacin (CIPRO) IVPB 400 mg     400 mg 200 mL/hr over 60 Minutes Intravenous Every 24 hours 09/10/16 1431     09/07/16 1500  ciprofloxacin (CIPRO) tablet 500 mg  Status:  Discontinued     500 mg Oral Daily 09/07/16 1058 09/10/16 1431   09/06/16 1800  meropenem (MERREM) IVPB SOLR 500 mg  Status:  Discontinued     500 mg 100 mL/hr over 30 Minutes Intravenous Every 24 hours 09/06/16 1625 09/09/16 0953   09/05/16 1800  ceFEPIme (MAXIPIME) 1 g in dextrose 5 % 50 mL IVPB  Status:  Discontinued     1 g 100 mL/hr over 30 Minutes Intravenous  Every 24 hours 09/04/16 2237 09/06/16 1613   09/04/16 2330  ceFEPIme (MAXIPIME) 1 g in dextrose 5 % 50 mL IVPB     1 g 100 mL/hr over 30 Minutes Intravenous  Once 09/04/16 2237 09/05/16 0243   09/04/16 2300  vancomycin (VANCOCIN) 1,500 mg in sodium chloride 0.9 % 500 mL IVPB     1,500 mg 250 mL/hr over 120 Minutes Intravenous  Once 09/04/16 2213 09/05/16 0124   09/04/16 2237  vancomycin (VANCOCIN) IVPB 750 mg/150 ml premix  Status:  Discontinued     750 mg 150 mL/hr over 60 Minutes Intravenous Every Dialysis 09/04/16 2237 09/05/16 5885      Assessment/Plan: Pancreatitis after ERCP, we will wait ntil pancreatitis have resolved before doing lap chole. We will postpone the surgery Caroleen Hamman, MD, FACS  09/11/2016

## 2016-09-11 NOTE — Progress Notes (Signed)
[facility]  ROUNDING NOTE   Subjective:   Patient Has developed pancreatitis after ERCP Does not report any shortness of breath at present     Objective:  Vital signs in last 24 hours:  Temp:  [97.9 F (36.6 C)-98.3 F (36.8 C)] 97.9 F (36.6 C) (03/28 0531) Pulse Rate:  [71-73] 72 (03/28 0531) Resp:  [16-20] 16 (03/28 0531) BP: (117-126)/(68-74) 123/74 (03/28 0531) SpO2:  [100 %] 100 % (03/28 0531) Weight:  [69.1 kg (152 lb 4.8 oz)] 69.1 kg (152 lb 4.8 oz) (03/28 0531)  Weight change: -1.769 kg (-3 lb 14.4 oz) Filed Weights   09/09/16 1637 09/10/16 0500 09/11/16 0531  Weight: 70.9 kg (156 lb 4.9 oz) 69.3 kg (152 lb 12.8 oz) 69.1 kg (152 lb 4.8 oz)    Intake/Output: I/O last 3 completed shifts: In: 880 [P.O.:480; I.V.:200; IV Piggyback:200] Out: 0    Intake/Output this shift:  No intake/output data recorded.  Physical Exam: General: NAD,   Head: Normocephalic, atraumatic. Moist oral mucosal membranes  ENT: moist mucus membranes  Neck: Supple, trachea midline  Lungs:  Clear to auscultation  Heart: Regular rate and rhythm  Abdomen:  Soft, nontender,   Extremities: no peripheral edema.  Neurologic: Nonfocal, moving all four extremities  Skin: No lesions  Access: Left AVF    Basic Metabolic Panel:  Recent Labs Lab 09/07/16 0330 09/08/16 0751 09/09/16 0401 09/10/16 0601 09/11/16 0417  NA 140 141 139 137 138  K 4.5 5.3* 5.0 4.7 4.5  CL 98* 97* 96* 94* 94*  CO2 30 28 28 28 28   GLUCOSE 129* 85 122* 106* 124*  BUN 39* 26* 39* 24* 37*  CREATININE 10.00* 8.07* 9.82* 7.78* 10.13*  CALCIUM 8.1* 8.8* 8.5* 8.4* 7.9*    Liver Function Tests:  Recent Labs Lab 09/07/16 0330 09/08/16 0751 09/09/16 0401 09/10/16 0601 09/11/16 0417  AST 89* 55* 41 43* 42*  ALT 151* 113* 86* 68* 57  ALKPHOS 257* 238* 241* 209* 203*  BILITOT 1.7* 1.9* 1.5* 1.6* 1.5*  PROT 7.5 8.2* 8.1 8.1 8.3*  ALBUMIN 3.2* 3.5 3.5 3.6 3.6    Recent Labs Lab  09/04/16 1834 09/10/16 0601 09/11/16 0417  LIPASE 50 47 4,082*    Recent Labs Lab 09/04/16 1834  AMMONIA 34    CBC:  Recent Labs Lab 09/04/16 1834 09/05/16 0607 09/07/16 0330 09/08/16 0751 09/09/16 0401  WBC 10.0 17.1* 8.0 6.8 6.7  NEUTROABS 8.9*  --   --   --   --   HGB 13.4 11.5* 11.0* 12.1* 12.2*  HCT 42.9 36.8* 34.9* 38.5* 38.4*  MCV 74.7* 75.1* 75.0* 74.9* 73.9*  PLT 241 223 194 222 235    Cardiac Enzymes:  Recent Labs Lab 09/04/16 1834 09/05/16 0104 09/05/16 0607  TROPONINI 0.03* 0.03* 0.03*    BNP: Invalid input(s): POCBNP  CBG:  Recent Labs Lab 09/10/16 0739 09/10/16 1205 09/10/16 1643 09/10/16 2126 09/11/16 0723  GLUCAP 114* 187* 103* 158* 119*    Microbiology: Results for orders placed or performed during the hospital encounter of 09/04/16  Blood culture (routine x 2)     Status: Abnormal   Collection Time: 09/04/16  9:01 PM  Result Value Ref Range Status   Specimen Description BLOOD RIGHT FOREARM  Final   Special Requests BOTTLES DRAWN AEROBIC AND ANAEROBIC  BCHV  Final   Culture  Setup Time   Final    GRAM NEGATIVE RODS IN BOTH AEROBIC AND ANAEROBIC BOTTLES CRITICAL RESULT CALLED TO, READ  BACK BY AND VERIFIED WITH: HANK ZOMPA 09/05/16 1128 SGD    Culture (A)  Final    ESCHERICHIA COLI SUSCEPTIBILITIES PERFORMED ON PREVIOUS CULTURE WITHIN THE LAST 5 DAYS. Performed at [facility], [place] [address], [place], [place] [address]    Report Status 09/07/2016 FINAL  Final  Blood culture (routine x 2)     Status: Abnormal   Collection Time: 09/04/16  9:01 PM  Result Value Ref Range Status   Specimen Description BLOOD RIGHT ANTECUBITAL  Final   Special Requests BOTTLES DRAWN AEROBIC AND ANAEROBIC  BCHV  Final   Culture  Setup Time   Final    GRAM NEGATIVE RODS IN BOTH AEROBIC AND ANAEROBIC BOTTLES CRITICAL RESULT CALLED TO, READ BACK BY AND VERIFIED WITH: HANK ZOMPA 09/05/16 1128 SGD Performed at [facility], [place]  [address] [place], [place] [address]    Culture ESCHERICHIA COLI (A)  Final   Report Status 09/07/2016 FINAL  Final   Organism ID, Bacteria ESCHERICHIA COLI  Final      Susceptibility   Escherichia coli - MIC*    AMPICILLIN <=2 SENSITIVE Sensitive     CEFAZOLIN <=4 SENSITIVE Sensitive     CEFEPIME <=1 SENSITIVE Sensitive     CEFTAZIDIME <=1 SENSITIVE Sensitive     CEFTRIAXONE <=1 SENSITIVE Sensitive     CIPROFLOXACIN <=0.25 SENSITIVE Sensitive     GENTAMICIN <=1 SENSITIVE Sensitive     IMIPENEM <=0.25 SENSITIVE Sensitive     TRIMETH/SULFA <=20 SENSITIVE Sensitive     AMPICILLIN/SULBACTAM <=2 SENSITIVE Sensitive     PIP/TAZO <=4 SENSITIVE Sensitive     Extended ESBL NEGATIVE Sensitive     * ESCHERICHIA COLI  Blood Culture ID Panel (Reflexed)     Status: Abnormal   Collection Time: 09/04/16  9:01 PM  Result Value Ref Range Status   Enterococcus species NOT DETECTED NOT DETECTED Final   Listeria monocytogenes NOT DETECTED NOT DETECTED Final   Staphylococcus species NOT DETECTED NOT DETECTED Final   Staphylococcus aureus NOT DETECTED NOT DETECTED Final   Streptococcus species NOT DETECTED NOT DETECTED Final   Streptococcus agalactiae NOT DETECTED NOT DETECTED Final   Streptococcus pneumoniae NOT DETECTED NOT DETECTED Final   Streptococcus pyogenes NOT DETECTED NOT DETECTED Final   Acinetobacter baumannii NOT DETECTED NOT DETECTED Final   Enterobacteriaceae species DETECTED (A) NOT DETECTED Final    Comment: Enterobacteriaceae represent a large family of gram-negative bacteria, not a single organism. CRITICAL RESULT CALLED TO, READ BACK BY AND VERIFIED WITH: HANK ZOMPA 09/05/16 1128 SGD    Enterobacter cloacae complex NOT DETECTED NOT DETECTED Final   Escherichia coli DETECTED (A) NOT DETECTED Final    Comment: CRITICAL RESULT CALLED TO, READ BACK BY AND VERIFIED WITH: HANK ZOMPA 09/05/16 1128 SGD    Klebsiella oxytoca NOT DETECTED NOT DETECTED Final   Klebsiella pneumoniae NOT DETECTED  NOT DETECTED Final   Proteus species NOT DETECTED NOT DETECTED Final   Serratia marcescens NOT DETECTED NOT DETECTED Final   Carbapenem resistance NOT DETECTED NOT DETECTED Final   Haemophilus influenzae NOT DETECTED NOT DETECTED Final   Neisseria meningitidis NOT DETECTED NOT DETECTED Final   Pseudomonas aeruginosa NOT DETECTED NOT DETECTED Final   Candida albicans NOT DETECTED NOT DETECTED Final   Candida glabrata NOT DETECTED NOT DETECTED Final   Candida krusei NOT DETECTED NOT DETECTED Final   Candida parapsilosis NOT DETECTED NOT DETECTED Final   Candida tropicalis NOT DETECTED NOT DETECTED Final  MRSA PCR  Screening     Status: None   Collection Time: 09/04/16 11:14 PM  Result Value Ref Range Status   MRSA by PCR NEGATIVE NEGATIVE Final    Comment:        The GeneXpert MRSA Assay (FDA approved for NASAL specimens only), is one component of a comprehensive MRSA colonization surveillance program. It is not intended to diagnose MRSA infection nor to guide or monitor treatment for MRSA infections.     Coagulation Studies: No results for input(s): LABPROT, INR in the last 72 hours.  Urinalysis: No results for input(s): COLORURINE, LABSPEC, PHURINE, GLUCOSEU, HGBUR, BILIRUBINUR, KETONESUR, PROTEINUR, UROBILINOGEN, NITRITE, LEUKOCYTESUR in the last 72 hours.  Invalid input(s): APPERANCEUR    Imaging: No results found.   Medications:   . dextrose 5 % and 0.45% NaCl     . allopurinol  100 mg Oral Daily  . aspirin EC  81 mg Oral Daily  . atorvastatin  40 mg Oral q1800  . ciprofloxacin  400 mg Intravenous Q24H  . heparin  5,000 Units Subcutaneous Q8H  . indomethacin  100 mg Rectal Once  . indomethacin  100 mg Rectal Once  . insulin aspart  0-5 Units Subcutaneous QHS  . insulin aspart  0-9 Units Subcutaneous TID WC  . levETIRAcetam  1,000 mg Oral Daily  . levETIRAcetam  250 mg Oral Q T,Th,Sa-HD  . pantoprazole  40 mg Oral Daily   morphine injection, ondansetron  **OR** ondansetron (ZOFRAN) IV, oxyCODONE  Assessment/ Plan:  [name] is a 66 y.o. 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   TTS [facility]  1. End Stage Renal Disease with hyperkalemia: TTS schedule. Next HD on thursday  2.  Secondary Hyperparathyroidism: with hyperphosphatemia. Not currently on binders.    3. Anemia of chronic kidney disease:  hemoglobin 12.2. Hold EPO  4. Common bile duct stone with Escherichia coli bacteremia. ERCP with stent placement done on 3/27 Postoperative complications of pancreatitis Agree with maintenance IV fluids of 30-40 cc per hour since patient is going to be NPO      LOS: 7 [name] 3/28/201811:50 AM

## 2016-09-11 NOTE — Progress Notes (Signed)
[place] INFECTIOUS DISEASE PROGRESS NOTE Date of Admission:  09/04/2016     ID: [name] is a 66 y.o. male with E coli bacteremia Principal Problem:   Sepsis ([place]) Active Problems:   ESRD on dialysis ([employer])   Diabetes ([place])   HTN (hypertension)   GERD (gastroesophageal reflux disease)   Chronic diastolic CHF (congestive heart failure) ([facility])   Transaminitis   CAP (community acquired pneumonia)   Choledocholithiasis   Calculus of bile duct without cholecystitis with obstruction   Subjective: Developed abd pain and pancreatitis after ERCP.  Resting   ROS  Eleven systems are reviewed and negative except per hpi  Medications:  Antibiotics Given (last 72 hours)    Date/Time Action Medication Dose Rate   09/08/16 1750 Given   meropenem (MERREM) IVPB SOLR 500 mg 500 mg 100 mL/hr   09/10/16 1452 Given   ciprofloxacin (CIPRO) IVPB 400 mg 400 mg 200 mL/hr   09/11/16 1452 Given   ciprofloxacin (CIPRO) IVPB 400 mg 400 mg 200 mL/hr     . allopurinol  100 mg Oral Daily  . aspirin EC  81 mg Oral Daily  . atorvastatin  40 mg Oral q1800  . ciprofloxacin  400 mg Intravenous Q24H  . heparin  5,000 Units Subcutaneous Q8H  . indomethacin  100 mg Rectal Once  . indomethacin  100 mg Rectal Once  . insulin aspart  0-5 Units Subcutaneous QHS  . insulin aspart  0-9 Units Subcutaneous TID WC  . levETIRAcetam  1,000 mg Oral Daily  . levETIRAcetam  250 mg Oral Q T,Th,Sa-HD  . pantoprazole  40 mg Oral Daily    Objective: Vital signs in last 24 hours: Temp:  [97.9 F (36.6 C)-98.2 F (36.8 C)] 98.2 F (36.8 C) (03/28 1319) Pulse Rate:  [70-72] 70 (03/28 1319) Resp:  [16] 16 (03/28 1319) BP: (117-123)/(67-74) 122/67 (03/28 1319) SpO2:  [99 %-100 %] 99 % (03/28 1319) Weight:  [69.1 kg (152 lb 4.8 oz)] 69.1 kg (152 lb 4.8 oz) (03/28 0531) Constitutional: He is oriented to person, place, and time. He appears well-developed and well-nourished. No distress.  HENT:  anicteric Mouth/Throat: Oropharynx is clear and dry . No oropharyngeal exudate.  Cardiovascular: Normal rate, regular rhythm and normal heart sounds. Pulmonary/Chest: Effort normal and breath sounds normal. No respiratory distress. He has no wheezes.  Abdominal: Soft. Bowel sounds are normal. He exhibits no distension.  Lymphadenopathy: He has no cervical adenopathy.  Neurological: He is alert and oriented to person, place, and time.  Skin: Skin is warm and dry. No rash noted. No erythema.  Psychiatric: He has a normal mood and affect. His behavior is normal.   Lab Results  Recent Labs  09/09/16 0401 09/10/16 0601 09/11/16 0417  WBC 6.7  --   --   HGB 12.2*  --   --   HCT 38.4*  --   --   NA 139 137 138  K 5.0 4.7 4.5  CL 96* 94* 94*  CO2 28 28 28   BUN 39* 24* 37*  CREATININE 9.82* 7.78* 10.13*    Microbiology: Results for orders placed or performed during the hospital encounter of 09/04/16  Blood culture (routine x 2)     Status: Abnormal   Collection Time: 09/04/16  9:01 PM  Result Value Ref Range Status   Specimen Description BLOOD RIGHT FOREARM  Final   Special Requests BOTTLES DRAWN AEROBIC AND ANAEROBIC  [place]  Final   Culture  Setup Time  Final    GRAM NEGATIVE RODS IN BOTH AEROBIC AND ANAEROBIC BOTTLES CRITICAL RESULT CALLED TO, READ BACK BY AND VERIFIED WITH: HANK ZOMPA 09/05/16 1128 SGD    Culture (A)  Final    ESCHERICHIA COLI SUSCEPTIBILITIES PERFORMED ON PREVIOUS CULTURE WITHIN THE LAST 5 DAYS. Performed at [facility], [place] [address], [place], [place] [address]    Report Status 09/07/2016 FINAL  Final  Blood culture (routine x 2)     Status: Abnormal   Collection Time: 09/04/16  9:01 PM  Result Value Ref Range Status   Specimen Description BLOOD RIGHT ANTECUBITAL  Final   Special Requests BOTTLES DRAWN AEROBIC AND ANAEROBIC  BCHV  Final   Culture  Setup Time   Final    GRAM NEGATIVE RODS IN BOTH AEROBIC AND ANAEROBIC BOTTLES CRITICAL RESULT  CALLED TO, READ BACK BY AND VERIFIED WITH: HANK ZOMPA 09/05/16 1128 SGD Performed at [facility], [place] [address], [place], [place] [address]    Culture ESCHERICHIA COLI (A)  Final   Report Status 09/07/2016 FINAL  Final   Organism ID, Bacteria ESCHERICHIA COLI  Final      Susceptibility   Escherichia coli - MIC*    AMPICILLIN <=2 SENSITIVE Sensitive     CEFAZOLIN <=4 SENSITIVE Sensitive     CEFEPIME <=1 SENSITIVE Sensitive     CEFTAZIDIME <=1 SENSITIVE Sensitive     CEFTRIAXONE <=1 SENSITIVE Sensitive     CIPROFLOXACIN <=0.25 SENSITIVE Sensitive     GENTAMICIN <=1 SENSITIVE Sensitive     IMIPENEM <=0.25 SENSITIVE Sensitive     TRIMETH/SULFA <=20 SENSITIVE Sensitive     AMPICILLIN/SULBACTAM <=2 SENSITIVE Sensitive     PIP/TAZO <=4 SENSITIVE Sensitive     Extended ESBL NEGATIVE Sensitive     * ESCHERICHIA COLI  Blood Culture ID Panel (Reflexed)     Status: Abnormal   Collection Time: 09/04/16  9:01 PM  Result Value Ref Range Status   Enterococcus species NOT DETECTED NOT DETECTED Final   Listeria monocytogenes NOT DETECTED NOT DETECTED Final   Staphylococcus species NOT DETECTED NOT DETECTED Final   Staphylococcus aureus NOT DETECTED NOT DETECTED Final   Streptococcus species NOT DETECTED NOT DETECTED Final   Streptococcus agalactiae NOT DETECTED NOT DETECTED Final   Streptococcus pneumoniae NOT DETECTED NOT DETECTED Final   Streptococcus pyogenes NOT DETECTED NOT DETECTED Final   Acinetobacter baumannii NOT DETECTED NOT DETECTED Final   Enterobacteriaceae species DETECTED (A) NOT DETECTED Final    Comment: Enterobacteriaceae represent a large family of gram-negative bacteria, not a single organism. CRITICAL RESULT CALLED TO, READ BACK BY AND VERIFIED WITH: HANK ZOMPA 09/05/16 1128 SGD    Enterobacter cloacae complex NOT DETECTED NOT DETECTED Final   Escherichia coli DETECTED (A) NOT DETECTED Final    Comment: CRITICAL RESULT CALLED TO, READ BACK BY AND VERIFIED WITH: HANK  ZOMPA 09/05/16 1128 SGD    Klebsiella oxytoca NOT DETECTED NOT DETECTED Final   Klebsiella pneumoniae NOT DETECTED NOT DETECTED Final   Proteus species NOT DETECTED NOT DETECTED Final   Serratia marcescens NOT DETECTED NOT DETECTED Final   Carbapenem resistance NOT DETECTED NOT DETECTED Final   Haemophilus influenzae NOT DETECTED NOT DETECTED Final   Neisseria meningitidis NOT DETECTED NOT DETECTED Final   Pseudomonas aeruginosa NOT DETECTED NOT DETECTED Final   Candida albicans NOT DETECTED NOT DETECTED Final   Candida glabrata NOT DETECTED NOT DETECTED Final   Candida krusei NOT DETECTED NOT DETECTED Final   Candida  parapsilosis NOT DETECTED NOT DETECTED Final   Candida tropicalis NOT DETECTED NOT DETECTED Final  MRSA PCR Screening     Status: None   Collection Time: 09/04/16 11:14 PM  Result Value Ref Range Status   MRSA by PCR NEGATIVE NEGATIVE Final    Comment:        The GeneXpert MRSA Assay (FDA approved for NASAL specimens only), is one component of a comprehensive MRSA colonization surveillance program. It is not intended to diagnose MRSA infection nor to guide or monitor treatment for MRSA infections.     Studies/Results: No results found.  Assessment/Plan: [name] is a 66 y.o. male admitted with sepsis and GI sxs. E coli bacteremia noted and appears to have a biliary source with MRI showing choledocholithiasis. s/p ERCP complicated by pancreatitis. May need cholecystectomy.  Recommendation Cont ciprofloxacin  If worsens with fevers or leukocytosis can add flagyl Thank you very much for the consult. Will follow with you.  Armine Rizzolo P   09/11/2016, 2:56 PM

## 2016-09-11 NOTE — Progress Notes (Signed)
[facility] [place] NAME: [name]    MR#:  [id number]  DATE OF BIRTH:  08/14/1950  SUBJECTIVE:   Bacteremia, Choledocholithiasys. s/p ERCp  09/10/16- stent placed in CBD. After procedure in evening he had complain of severe abdominal pain, found to have elevated lipase. Said today the pain is under control with medications.  REVIEW OF SYSTEMS:    Review of Systems  Constitutional: Negative for chills, fever and malaise/fatigue.  HENT: Negative.  Negative for ear discharge, ear pain, hearing loss, nosebleeds and sore throat.   Eyes: Negative.  Negative for blurred vision and pain.  Respiratory: Negative.  Negative for cough, hemoptysis, shortness of breath and wheezing.   Cardiovascular: Negative.  Negative for chest pain, palpitations and leg swelling.  Gastrointestinal: Negative for abdominal pain, blood in stool, diarrhea, nausea and vomiting.  Genitourinary: Negative.  Negative for dysuria.  Musculoskeletal: Negative.  Negative for back pain.  Skin: Negative.   Neurological: Negative for dizziness, tremors, speech change, focal weakness, seizures, weakness (better) and headaches.  Endo/Heme/Allergies: Negative.  Does not bruise/bleed easily.  Psychiatric/Behavioral: Negative.  Negative for depression, hallucinations and suicidal ideas.   Tolerating Diet: yes  DRUG ALLERGIES:   Allergies  Allergen Reactions  . Ivp Dye [Iodinated Diagnostic Agents] Other (See Comments)    Pt denied    VITALS:  Blood pressure 122/67, pulse 70, temperature 98.2 F (36.8 C), temperature source Oral, resp. rate 16, height 4\' 9"  (1.448 m), weight 69.1 kg (152 lb 4.8 oz), SpO2 99 %.  PHYSICAL EXAMINATION:   Physical Exam  Constitutional: He is oriented to person, place, and time and well-developed, well-nourished, and in no distress. No distress.  HENT:  Head: Normocephalic.  Eyes: No scleral icterus.  Neck: Normal range of motion. Neck supple.  No JVD present. No tracheal deviation present.  Cardiovascular: Normal rate, regular rhythm and normal heart sounds.  Exam reveals no gallop and no friction rub.   No murmur heard. Pulmonary/Chest: Effort normal and breath sounds normal. No respiratory distress. He has no wheezes. He has no rales. He exhibits no tenderness.  Abdominal: Soft. Bowel sounds are normal. He exhibits no distension and no mass. There is no tenderness. There is no rebound and no guarding.  Musculoskeletal: Normal range of motion. He exhibits no edema.  Neurological: He is alert and oriented to person, place, and time.  Skin: Skin is warm. No rash noted. No erythema.  Psychiatric: Affect and judgment normal.      LABORATORY PANEL:   CBC  Recent Labs Lab 09/09/16 0401  WBC 6.7  HGB 12.2*  HCT 38.4*  PLT 235   ------------------------------------------------------------------------------------------------------------------  Chemistries   Recent Labs Lab 09/11/16 0417  NA 138  K 4.5  CL 94*  CO2 28  GLUCOSE 124*  BUN 37*  CREATININE 10.13*  CALCIUM 7.9*  AST 42*  ALT 57  ALKPHOS 203*  BILITOT 1.5*   ------------------------------------------------------------------------------------------------------------------  Cardiac Enzymes  Recent Labs Lab 09/04/16 1834 09/05/16 0104 09/05/16 0607  TROPONINI 0.03* 0.03* 0.03*   ------------------------------------------------------------------------------------------------------------------  RADIOLOGY:  No results found.   ASSESSMENT AND PLAN:   66 year old male with ESRD on hemodialysis, diabetes and essential hypertension who presents with sepsis.  1. Escherichia coli Sepsis present on admission: Patient presented with tachycardia, tachypnea and leukocytosis Sepsis from pneumonia and Escherichia coli bacteremia from Probable choledocholithiasis Appreciate ID consult Continue oral ciprofloxacin as per sensitivities needs 14 days of  antibiotics total.  2. Cholangitis with  Elevated LFTs with chronic elevation in LFTs: LFTs have improved  MRCP shows choledocholithiasis  appreciate GI and surgery consultation  ERCP with stent in CBD on 09/10/16 Plan for [place] on 09/11/16- postponed as he have acute pancreatitis now.  3. ESRD on hemodialysis: Continue dialysis as per nephrology   4. Hyperkalemia: Treated with insulin and dextrose and hemodialysis This has  resolved   4. Diabetes: Continue sliding scale insulin 5. Chronic diastolic heart failure : no evidence of exacerbation  6. Acute pancreatitis   Morphin IV for pain, NPO except meds, gentle hydration.   Spoke to nephro to help with fluid balance.   Follow Lipase.  Management plans discussed with the patient and he is in agreement.  CODE STATUS: full  TOTAL TIME TAKING CARE OF THIS PATIENT: 30 minutes.   POSSIBLE D/C 2-3  days, DEPENDING ON CLINICAL CONDITION.   Vaughan Basta M.D on 09/11/2016 at 3:14 PM  Between 7am to 6pm - Pager - [phone] After 6pm go to [email or web address] - password [email or web address] [facility]  Office  [phone]  CC: Primary care physician; [name], MD  Note: This dictation was prepared with Dragon dictation along with smaller phrase technology. Any transcriptional errors that result from this process are unintentional.

## 2016-09-12 ENCOUNTER — Encounter: Payer: Self-pay | Admitting: Gastroenterology

## 2016-09-12 LAB — COMPREHENSIVE METABOLIC PANEL
ALK PHOS: 165 U/L — AB (ref 38–126)
ALT: 38 U/L (ref 17–63)
ANION GAP: 17 — AB (ref 5–15)
AST: 32 U/L (ref 15–41)
Albumin: 3.2 g/dL — ABNORMAL LOW (ref 3.5–5.0)
BILIRUBIN TOTAL: 1.1 mg/dL (ref 0.3–1.2)
BUN: 45 mg/dL — ABNORMAL HIGH (ref 6–20)
CALCIUM: 7 mg/dL — AB (ref 8.9–10.3)
CO2: 26 mmol/L (ref 22–32)
CREATININE: 12.03 mg/dL — AB (ref 0.61–1.24)
Chloride: 95 mmol/L — ABNORMAL LOW (ref 101–111)
GFR, EST AFRICAN AMERICAN: 4 mL/min — AB (ref >60)
GFR, EST NON AFRICAN AMERICAN: 4 mL/min — AB (ref >60)
Glucose, Bld: 102 mg/dL — ABNORMAL HIGH (ref 65–99)
Potassium: 4.5 mmol/L (ref 3.5–5.1)
SODIUM: 138 mmol/L (ref 135–145)
TOTAL PROTEIN: 7.6 g/dL (ref 6.5–8.1)

## 2016-09-12 LAB — GLUCOSE, CAPILLARY
GLUCOSE-CAPILLARY: 108 mg/dL — AB (ref 65–99)
GLUCOSE-CAPILLARY: 97 mg/dL (ref 65–99)
GLUCOSE-CAPILLARY: 136 mg/dL — AB (ref 65–99)
GLUCOSE-CAPILLARY: 148 mg/dL — AB (ref 65–99)
Glucose-Capillary: 81 mg/dL (ref 65–99)

## 2016-09-12 LAB — LIPASE, BLOOD: LIPASE: 1133 U/L — AB (ref 11–51)

## 2016-09-12 NOTE — Progress Notes (Signed)
Post hd vitals 

## 2016-09-12 NOTE — Progress Notes (Signed)
Post hd assessment 

## 2016-09-12 NOTE — Progress Notes (Signed)
  End of hd 

## 2016-09-12 NOTE — Progress Notes (Signed)
[facility] [place] NAME: [name]    MR#:  [id number]  DATE OF BIRTH:  February 17, 1951  SUBJECTIVE:   Bacteremia, Choledocholithiasys. s/p ERCp  09/10/16- stent placed in CBD. After procedure in evening he had complain of severe abdominal pain, found to have elevated lipase. Said today the pain is under control with medications.  REVIEW OF SYSTEMS:    Review of Systems  Constitutional: Negative for chills, fever and malaise/fatigue.  HENT: Negative.  Negative for ear discharge, ear pain, hearing loss, nosebleeds and sore throat.   Eyes: Negative.  Negative for blurred vision and pain.  Respiratory: Negative.  Negative for cough, hemoptysis, shortness of breath and wheezing.   Cardiovascular: Negative.  Negative for chest pain, palpitations and leg swelling.  Gastrointestinal: Negative for abdominal pain, blood in stool, diarrhea, nausea and vomiting.  Genitourinary: Negative.  Negative for dysuria.  Musculoskeletal: Negative.  Negative for back pain.  Skin: Negative.   Neurological: Negative for dizziness, tremors, speech change, focal weakness, seizures, weakness (better) and headaches.  Endo/Heme/Allergies: Negative.  Does not bruise/bleed easily.  Psychiatric/Behavioral: Negative.  Negative for depression, hallucinations and suicidal ideas.   Tolerating Diet: yes  DRUG ALLERGIES:   Allergies  Allergen Reactions  . Ivp Dye [Iodinated Diagnostic Agents] Other (See Comments)    Pt denied    VITALS:  Blood pressure (!) 149/73, pulse 90, temperature 98.1 F (36.7 C), resp. rate (!) 22, height 4\' 9"  (1.448 m), weight 69.8 kg (153 lb 14.1 oz), SpO2 100 %.  PHYSICAL EXAMINATION:   Physical Exam  Constitutional: He is oriented to person, place, and time and well-developed, well-nourished, and in no distress. No distress.  HENT:  Head: Normocephalic.  Eyes: No scleral icterus.  Neck: Normal range of motion. Neck supple. No JVD  present. No tracheal deviation present.  Cardiovascular: Normal rate, regular rhythm and normal heart sounds.  Exam reveals no gallop and no friction rub.   No murmur heard. Pulmonary/Chest: Effort normal and breath sounds normal. No respiratory distress. He has no wheezes. He has no rales. He exhibits no tenderness.  Abdominal: Soft. Bowel sounds are normal. He exhibits no distension and no mass. There is no tenderness. There is no rebound and no guarding.  Musculoskeletal: Normal range of motion. He exhibits no edema.  Neurological: He is alert and oriented to person, place, and time.  Skin: Skin is warm. No rash noted. No erythema.  Psychiatric: Affect and judgment normal.      LABORATORY PANEL:   CBC  Recent Labs Lab 09/09/16 0401  WBC 6.7  HGB 12.2*  HCT 38.4*  PLT 235   ------------------------------------------------------------------------------------------------------------------  Chemistries   Recent Labs Lab 09/12/16 0435  NA 138  K 4.5  CL 95*  CO2 26  GLUCOSE 102*  BUN 45*  CREATININE 12.03*  CALCIUM 7.0*  AST 32  ALT 38  ALKPHOS 165*  BILITOT 1.1   ------------------------------------------------------------------------------------------------------------------  Cardiac Enzymes No results for input(s): TROPONINI in the last 168 hours. ------------------------------------------------------------------------------------------------------------------  RADIOLOGY:  No results found.   ASSESSMENT AND PLAN:   66 year old male with ESRD on hemodialysis, diabetes and essential hypertension who presents with sepsis.  1. Escherichia coli Sepsis present on admission: Patient presented with tachycardia, tachypnea and leukocytosis Sepsis from pneumonia and Escherichia coli bacteremia from Probable choledocholithiasis Appreciate ID consult Continue oral ciprofloxacin as per sensitivities needs 14 days of antibiotics total.  2. Cholangitis with Elevated  LFTs with chronic elevation in LFTs: LFTs  have improved  MRCP shows choledocholithiasis  appreciate GI and surgery consultation  ERCP with stent in CBD on 09/10/16 Plan for Laproscopic Cholecyctectomy on 09/11/16- postponed as he have acute pancreatitis now.  As Lipase coming down now- likely surgery tomorrow on 09/13/16.  3. ESRD on hemodialysis: Continue dialysis as per nephrology   4. Hyperkalemia: Treated with insulin and dextrose and hemodialysis This has  resolved   4. Diabetes: Continue sliding scale insulin 5. Chronic diastolic heart failure : no evidence of exacerbation  6. Acute pancreatitis   Morphin IV for pain, NPO except meds, gentle hydration.   Spoke to nephro to help with fluid balance.   Follow Lipase.  Management plans discussed with the patient and he is in agreement.  CODE STATUS: full  TOTAL TIME TAKING CARE OF THIS PATIENT: 30 minutes.   POSSIBLE D/C 2-3  days, DEPENDING ON CLINICAL CONDITION.   Vaughan Basta M.D on 09/12/2016 at 9:09 PM  Between 7am to 6pm - Pager - [phone] After 6pm go to [email or web address] - password [email or web address] [facility]  Office  [phone]  CC: Primary care physician; [name], MD  Note: This dictation was prepared with Dragon dictation along with smaller phrase technology. Any transcriptional errors that result from this process are unintentional.

## 2016-09-12 NOTE — Progress Notes (Signed)
[facility]  ROUNDING NOTE   Subjective:   Patient Has developed pancreatitis after ERCP Does not report any shortness of breath at present Seen during HD. Feeling sad.    HEMODIALYSIS FLOWSHEET:  Blood Flow Rate (mL/min): 400 mL/min Arterial Pressure (mmHg): -110 mmHg Venous Pressure (mmHg): 210 mmHg Transmembrane Pressure (mmHg): 60 mmHg Ultrafiltration Rate (mL/min): 170 mL/min Dialysate Flow Rate (mL/min): 800 ml/min Conductivity: Machine : 14.1 Conductivity: Machine : 14.1 Dialysis Fluid Bolus: Normal Saline Bolus Amount (mL): 250 mL Dialysate Change: 2K Intra-Hemodialysis Comments: 315. ended       Objective:  Vital signs in last 24 hours:  Temp:  [97.4 F (36.3 C)-99.3 F (37.4 C)] 98.1 F (36.7 C) (03/29 1250) Pulse Rate:  [70-95] 90 (03/29 1250) Resp:  [16-22] 22 (03/29 1225) BP: (126-166)/(64-89) 149/73 (03/29 1250) SpO2:  [91 %-100 %] 100 % (03/29 1250) Weight:  [69.7 kg (153 lb 10.6 oz)-69.8 kg (153 lb 14.1 oz)] 69.8 kg (153 lb 14.1 oz) (03/29 1220)  Weight change:  Filed Weights   09/11/16 0531 09/12/16 1015 09/12/16 1220  Weight: 69.1 kg (152 lb 4.8 oz) 69.7 kg (153 lb 10.6 oz) 69.8 kg (153 lb 14.1 oz)    Intake/Output: I/O last 3 completed shifts: In: 935.3 [P.O.:60; I.V.:675.3; IV Piggyback:200] Out: 100 [Urine:100]   Intake/Output this shift:  Total I/O In: -  Out: -185   Physical Exam: General: NAD,   Head: Normocephalic, atraumatic. Moist oral mucosal membranes  ENT: moist mucus membranes  Neck: Supple, trachea midline  Lungs:  Clear to auscultation  Heart: Regular rate and rhythm  Abdomen:  Soft, nontender,   Extremities: no peripheral edema.  Neurologic: Nonfocal, moving all four extremities  Skin: No lesions  Access: Left AVF    Basic Metabolic Panel:  Recent Labs Lab 09/08/16 0751 09/09/16 0401 09/10/16 0601 09/11/16 0417 09/12/16 0435  NA 141 139 137 138 138  K 5.3* 5.0 4.7 4.5 4.5  CL 97* 96* 94*  94* 95*  CO2 28 28 28 28 26   GLUCOSE 85 122* 106* 124* 102*  BUN 26* 39* 24* 37* 45*  CREATININE 8.07* 9.82* 7.78* 10.13* 12.03*  CALCIUM 8.8* 8.5* 8.4* 7.9* 7.0*    Liver Function Tests:  Recent Labs Lab 09/08/16 0751 09/09/16 0401 09/10/16 0601 09/11/16 0417 09/12/16 0435  AST 55* 41 43* 42* 32  ALT 113* 86* 68* 57 38  ALKPHOS 238* 241* 209* 203* 165*  BILITOT 1.9* 1.5* 1.6* 1.5* 1.1  PROT 8.2* 8.1 8.1 8.3* 7.6  ALBUMIN 3.5 3.5 3.6 3.6 3.2*    Recent Labs Lab 09/10/16 0601 09/11/16 0417 09/12/16 0435  LIPASE 47 4,082* 1,133*   No results for input(s): AMMONIA in the last 168 hours.  CBC:  Recent Labs Lab 09/07/16 0330 09/08/16 0751 09/09/16 0401  WBC 8.0 6.8 6.7  HGB 11.0* 12.1* 12.2*  HCT 34.9* 38.5* 38.4*  MCV 75.0* 74.9* 73.9*  PLT 194 222 235    Cardiac Enzymes: No results for input(s): CKTOTAL, CKMB, CKMBINDEX, TROPONINI in the last 168 hours.  BNP: Invalid input(s): POCBNP  CBG:  Recent Labs Lab 09/11/16 1144 09/11/16 1653 09/11/16 2132 09/12/16 0726 09/12/16 1325  GLUCAP 156* 136* 108* 97 81    Microbiology: Results for orders placed or performed during the hospital encounter of 09/04/16  Blood culture (routine x 2)     Status: Abnormal   Collection Time: 09/04/16  9:01 PM  Result Value Ref Range Status   Specimen Description BLOOD RIGHT FOREARM  Final   Special Requests BOTTLES DRAWN AEROBIC AND ANAEROBIC  BCHV  Final   Culture  Setup Time   Final    GRAM NEGATIVE RODS IN BOTH AEROBIC AND ANAEROBIC BOTTLES CRITICAL RESULT CALLED TO, READ BACK BY AND VERIFIED WITH: HANK ZOMPA 09/05/16 1128 SGD    Culture (A)  Final    ESCHERICHIA COLI SUSCEPTIBILITIES PERFORMED ON PREVIOUS CULTURE WITHIN THE LAST 5 DAYS. Performed at [facility], [place] [address] [place], [place] [address]    Report Status 09/07/2016 FINAL  Final  Blood culture (routine x 2)     Status: Abnormal   Collection Time: 09/04/16  9:01 PM  Result Value Ref  Range Status   Specimen Description BLOOD RIGHT ANTECUBITAL  Final   Special Requests BOTTLES DRAWN AEROBIC AND ANAEROBIC  BCHV  Final   Culture  Setup Time   Final    GRAM NEGATIVE RODS IN BOTH AEROBIC AND ANAEROBIC BOTTLES CRITICAL RESULT CALLED TO, READ BACK BY AND VERIFIED WITH: HANK ZOMPA 09/05/16 1128 SGD Performed at [facility], [place] [address], [place], [name] [address]    Culture ESCHERICHIA COLI (A)  Final   Report Status 09/07/2016 FINAL  Final   Organism ID, Bacteria ESCHERICHIA COLI  Final      Susceptibility   Escherichia coli - MIC*    AMPICILLIN <=2 SENSITIVE Sensitive     CEFAZOLIN <=4 SENSITIVE Sensitive     CEFEPIME <=1 SENSITIVE Sensitive     CEFTAZIDIME <=1 SENSITIVE Sensitive     CEFTRIAXONE <=1 SENSITIVE Sensitive     CIPROFLOXACIN <=0.25 SENSITIVE Sensitive     GENTAMICIN <=1 SENSITIVE Sensitive     IMIPENEM <=0.25 SENSITIVE Sensitive     TRIMETH/SULFA <=20 SENSITIVE Sensitive     AMPICILLIN/SULBACTAM <=2 SENSITIVE Sensitive     PIP/TAZO <=4 SENSITIVE Sensitive     Extended ESBL NEGATIVE Sensitive     * ESCHERICHIA COLI  Blood Culture ID Panel (Reflexed)     Status: Abnormal   Collection Time: 09/04/16  9:01 PM  Result Value Ref Range Status   Enterococcus species NOT DETECTED NOT DETECTED Final   Listeria monocytogenes NOT DETECTED NOT DETECTED Final   Staphylococcus species NOT DETECTED NOT DETECTED Final   Staphylococcus aureus NOT DETECTED NOT DETECTED Final   Streptococcus species NOT DETECTED NOT DETECTED Final   Streptococcus agalactiae NOT DETECTED NOT DETECTED Final   Streptococcus pneumoniae NOT DETECTED NOT DETECTED Final   Streptococcus pyogenes NOT DETECTED NOT DETECTED Final   Acinetobacter baumannii NOT DETECTED NOT DETECTED Final   Enterobacteriaceae species DETECTED (A) NOT DETECTED Final    Comment: Enterobacteriaceae represent a large family of gram-negative bacteria, not a single organism. CRITICAL RESULT CALLED TO, READ BACK  BY AND VERIFIED WITH: HANK ZOMPA 09/05/16 1128 SGD    Enterobacter cloacae complex NOT DETECTED NOT DETECTED Final   Escherichia coli DETECTED (A) NOT DETECTED Final    Comment: CRITICAL RESULT CALLED TO, READ BACK BY AND VERIFIED WITH: HANK ZOMPA 09/05/16 1128 SGD    Klebsiella oxytoca NOT DETECTED NOT DETECTED Final   Klebsiella pneumoniae NOT DETECTED NOT DETECTED Final   Proteus species NOT DETECTED NOT DETECTED Final   Serratia marcescens NOT DETECTED NOT DETECTED Final   Carbapenem resistance NOT DETECTED NOT DETECTED Final   Haemophilus influenzae NOT DETECTED NOT DETECTED Final   Neisseria meningitidis NOT DETECTED NOT DETECTED Final   Pseudomonas aeruginosa NOT DETECTED NOT DETECTED Final   Candida albicans NOT DETECTED NOT DETECTED  Final   Candida glabrata NOT DETECTED NOT DETECTED Final   Candida krusei NOT DETECTED NOT DETECTED Final   Candida parapsilosis NOT DETECTED NOT DETECTED Final   Candida tropicalis NOT DETECTED NOT DETECTED Final  MRSA PCR Screening     Status: None   Collection Time: 09/04/16 11:14 PM  Result Value Ref Range Status   MRSA by PCR NEGATIVE NEGATIVE Final    Comment:        The GeneXpert MRSA Assay (FDA approved for NASAL specimens only), is one component of a comprehensive MRSA colonization surveillance program. It is not intended to diagnose MRSA infection nor to guide or monitor treatment for MRSA infections.     Coagulation Studies: No results for input(s): LABPROT, INR in the last 72 hours.  Urinalysis: No results for input(s): COLORURINE, LABSPEC, PHURINE, GLUCOSEU, HGBUR, BILIRUBINUR, KETONESUR, PROTEINUR, UROBILINOGEN, NITRITE, LEUKOCYTESUR in the last 72 hours.  Invalid input(s): APPERANCEUR    Imaging: No results found.   Medications:    . allopurinol  100 mg Oral Daily  . aspirin EC  81 mg Oral Daily  . atorvastatin  40 mg Oral q1800  . ciprofloxacin  400 mg Intravenous Q24H  . heparin  5,000 Units Subcutaneous  Q8H  . indomethacin  100 mg Rectal Once  . indomethacin  100 mg Rectal Once  . insulin aspart  0-5 Units Subcutaneous QHS  . insulin aspart  0-9 Units Subcutaneous TID WC  . levETIRAcetam  1,000 mg Oral Daily  . levETIRAcetam  250 mg Oral Q T,Th,Sa-HD  . pantoprazole  40 mg Oral Daily   morphine injection, ondansetron **OR** ondansetron (ZOFRAN) IV, oxyCODONE  Assessment/ Plan:  [name] is a 66 y.o. 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   TTS [facility]  1. End Stage Renal Disease with hyperkalemia: TTS schedule. Patient seen during dialysis Tolerating well   2.  Secondary Hyperparathyroidism: with hyperphosphatemia.  - Not currently on binders.   3. Anemia of chronic kidney disease:  hemoglobin 12.2. Hold EPO  4. Common bile duct stone with Escherichia coli bacteremia. ERCP with stent placement done on 3/27 Postoperative complications of pancreatitis Cholecystectomy pending      LOS: 8 Dayron Odland 3/29/20181:44 PM

## 2016-09-12 NOTE — Progress Notes (Signed)
Pre hd assessment  

## 2016-09-12 NOTE — Progress Notes (Signed)
Pre hd info 

## 2016-09-12 NOTE — Progress Notes (Signed)
CC: CBD stone Subjective: Better, some intermittent pain. Lipase coming down to 1000 AVSS  Objective: Vital signs in last 24 hours: Temp:  [97.4 F (36.3 C)-98.3 F (36.8 C)] 97.4 F (36.3 C) (03/29 1015) Pulse Rate:  [70-77] 74 (03/29 1050) Resp:  [16-20] 20 (03/29 1050) BP: (122-166)/(64-76) 166/72 (03/29 1050) SpO2:  [91 %-99 %] 91 % (03/29 1050) Weight:  [69.7 kg (153 lb 10.6 oz)] 69.7 kg (153 lb 10.6 oz) (03/29 1015) Last BM Date: 09/12/16  Intake/Output from previous day: 03/28 0701 - 03/29 0700 In: 675.3 [I.V.:675.3] Out: 100 [Urine:100] Intake/Output this shift: No intake/output data recorded.  Physical exam: NAD, awake, non toxic Abd: soft, NT, no peritonitis Ext: no edema, well perfused  Lab Results: CBC  No results for input(s): WBC, HGB, HCT, PLT in the last 72 hours. BMET  Recent Labs  09/11/16 0417 09/12/16 0435  NA 138 138  K 4.5 4.5  CL 94* 95*  CO2 28 26  GLUCOSE 124* 102*  BUN 37* 45*  CREATININE 10.13* 12.03*  CALCIUM 7.9* 7.0*   PT/INR No results for input(s): LABPROT, INR in the last 72 hours. ABG No results for input(s): PHART, HCO3 in the last 72 hours.  Invalid input(s): PCO2, PO2  Studies/Results: No results found.  Anti-infectives: Anti-infectives    Start     Dose/Rate Route Frequency Ordered Stop   09/10/16 1500  ciprofloxacin (CIPRO) IVPB 400 mg     400 mg 200 mL/hr over 60 Minutes Intravenous Every 24 hours 09/10/16 1431     09/07/16 1500  ciprofloxacin (CIPRO) tablet 500 mg  Status:  Discontinued     500 mg Oral Daily 09/07/16 1058 09/10/16 1431   09/06/16 1800  meropenem (MERREM) IVPB SOLR 500 mg  Status:  Discontinued     500 mg 100 mL/hr over 30 Minutes Intravenous Every 24 hours 09/06/16 1625 09/09/16 0953   09/05/16 1800  ceFEPIme (MAXIPIME) 1 g in dextrose 5 % 50 mL IVPB  Status:  Discontinued     1 g 100 mL/hr over 30 Minutes Intravenous Every 24 hours 09/04/16 2237 09/06/16 1613   09/04/16 2330  ceFEPIme  (MAXIPIME) 1 g in dextrose 5 % 50 mL IVPB     1 g 100 mL/hr over 30 Minutes Intravenous  Once 09/04/16 2237 09/05/16 0243   09/04/16 2300  vancomycin (VANCOCIN) 1,500 mg in sodium chloride 0.9 % 500 mL IVPB     1,500 mg 250 mL/hr over 120 Minutes Intravenous  Once 09/04/16 2213 09/05/16 0124   09/04/16 2237  vancomycin (VANCOCIN) IVPB 750 mg/150 ml premix  Status:  Discontinued     750 mg 150 mL/hr over 60 Minutes Intravenous Every Dialysis 09/04/16 2237 09/05/16 7121      Assessment/Plan: CBD stones s/p ERCP developed pancreatitis, Clinically improving  I think he may be ready for a lap chole tomorrow. We will reassess and Dr Jamal Collin will make that determination in am  Caroleen Hamman, MD, [facility]  09/12/2016

## 2016-09-12 NOTE — Progress Notes (Signed)
Hd start 

## 2016-09-13 ENCOUNTER — Inpatient Hospital Stay: Payer: Medicare Other

## 2016-09-13 LAB — BASIC METABOLIC PANEL
ANION GAP: 15 (ref 5–15)
BUN: 33 mg/dL — AB (ref 6–20)
CALCIUM: 7.3 mg/dL — AB (ref 8.9–10.3)
CO2: 26 mmol/L (ref 22–32)
Chloride: 94 mmol/L — ABNORMAL LOW (ref 101–111)
Creatinine, Ser: 10.2 mg/dL — ABNORMAL HIGH (ref 0.61–1.24)
GFR calc Af Amer: 5 mL/min — ABNORMAL LOW (ref >60)
GFR, EST NON AFRICAN AMERICAN: 5 mL/min — AB (ref >60)
GLUCOSE: 106 mg/dL — AB (ref 65–99)
POTASSIUM: 5.2 mmol/L — AB (ref 3.5–5.1)
Sodium: 135 mmol/L (ref 135–145)

## 2016-09-13 LAB — CBC
HEMATOCRIT: 33.4 % — AB (ref 40.0–52.0)
Hemoglobin: 10.5 g/dL — ABNORMAL LOW (ref 13.0–18.0)
MCH: 23.1 pg — AB (ref 26.0–34.0)
MCHC: 31.5 g/dL — ABNORMAL LOW (ref 32.0–36.0)
MCV: 73.3 fL — AB (ref 80.0–100.0)
PLATELETS: 229 10*3/uL (ref 150–440)
RBC: 4.56 MIL/uL (ref 4.40–5.90)
RDW: 18.4 % — AB (ref 11.5–14.5)
WBC: 17.5 10*3/uL — ABNORMAL HIGH (ref 3.8–10.6)

## 2016-09-13 LAB — GLUCOSE, CAPILLARY
GLUCOSE-CAPILLARY: 110 mg/dL — AB (ref 65–99)
GLUCOSE-CAPILLARY: 117 mg/dL — AB (ref 65–99)
GLUCOSE-CAPILLARY: 103 mg/dL — AB (ref 65–99)
Glucose-Capillary: 110 mg/dL — ABNORMAL HIGH (ref 65–99)

## 2016-09-13 LAB — LIPASE, BLOOD: Lipase: 67 U/L — ABNORMAL HIGH (ref 11–51)

## 2016-09-13 MED ORDER — EPOETIN ALFA 10000 UNIT/ML IJ SOLN
4000.0000 [IU] | INTRAMUSCULAR | Status: DC
  Administered 2016-09-14: 4000 [IU] via INTRAVENOUS
  Filled 2016-09-13: qty 0.4

## 2016-09-13 MED ORDER — CIPROFLOXACIN HCL 500 MG PO TABS: 500.0000 mg | ORAL_TABLET | ORAL | Status: DC

## 2016-09-13 NOTE — Progress Notes (Signed)
[facility] [place] NAME: [name]    MR#:  [id number]  DATE OF BIRTH:  1951-06-11  SUBJECTIVE:   Patient hungry WBC up today but no fever  REVIEW OF SYSTEMS:    Review of Systems  Constitutional: Negative for chills, fever and malaise/fatigue.  HENT: Negative.  Negative for ear discharge, ear pain, hearing loss, nosebleeds and sore throat.   Eyes: Negative.  Negative for blurred vision and pain.  Respiratory: Negative.  Negative for cough, hemoptysis, shortness of breath and wheezing.   Cardiovascular: Negative.  Negative for chest pain, palpitations and leg swelling.  Gastrointestinal: Negative for abdominal pain, blood in stool, diarrhea, nausea and vomiting.  Genitourinary: Negative.  Negative for dysuria.  Musculoskeletal: Negative.  Negative for back pain.  Skin: Negative.   Neurological: Negative for dizziness, tremors, speech change, focal weakness, seizures, weakness (better) and headaches.  Endo/Heme/Allergies: Negative.  Does not bruise/bleed easily.  Psychiatric/Behavioral: Negative.  Negative for depression, hallucinations and suicidal ideas.   Tolerating Diet: yes  DRUG ALLERGIES:   Allergies  Allergen Reactions  . Ivp Dye [Iodinated Diagnostic Agents] Other (See Comments)    Pt denied    VITALS:  Blood pressure (!) 147/58, pulse 77, temperature 97.7 F (36.5 C), temperature source Oral, resp. rate 16, height 4\' 9"  (1.448 m), weight 69.8 kg (153 lb 14.1 oz), SpO2 97 %.  PHYSICAL EXAMINATION:   Physical Exam  Constitutional: He is oriented to person, place, and time and well-developed, well-nourished, and in no distress. No distress.  HENT:  Head: Normocephalic.  Eyes: No scleral icterus.  Neck: Normal range of motion. Neck supple. No JVD present. No tracheal deviation present.  Cardiovascular: Normal rate, regular rhythm and normal heart sounds.  Exam reveals no gallop and no friction rub.   No murmur  heard. Pulmonary/Chest: Effort normal and breath sounds normal. No respiratory distress. He has no wheezes. He has no rales. He exhibits no tenderness.  Abdominal: Soft. Bowel sounds are normal. He exhibits no distension and no mass. There is no tenderness. There is no rebound and no guarding.  Musculoskeletal: Normal range of motion. He exhibits no edema.  Neurological: He is alert and oriented to person, place, and time.  Skin: Skin is warm. No rash noted. No erythema.  Psychiatric: Affect and judgment normal.      LABORATORY PANEL:   CBC  Recent Labs Lab 09/13/16 0624  WBC 17.5*  HGB 10.5*  HCT 33.4*  PLT 229   ------------------------------------------------------------------------------------------------------------------  Chemistries   Recent Labs Lab 09/12/16 0435 09/13/16 0624  NA 138 135  K 4.5 5.2*  CL 95* 94*  CO2 26 26  GLUCOSE 102* 106*  BUN 45* 33*  CREATININE 12.03* 10.20*  CALCIUM 7.0* 7.3*  AST 32  --   ALT 38  --   ALKPHOS 165*  --   BILITOT 1.1  --    ------------------------------------------------------------------------------------------------------------------  Cardiac Enzymes No results for input(s): TROPONINI in the last 168 hours. ------------------------------------------------------------------------------------------------------------------  RADIOLOGY:  Dg Chest 1 View  Result Date: 09/13/2016 CLINICAL DATA:  Pneumonia. EXAM: CHEST 1 VIEW COMPARISON:  09/04/2016 FINDINGS: Continued consolidation at the left lung base, improved since prior study. Possible small left effusion. Right lung is clear. Mild cardiomegaly. Prior median sternotomy. IMPRESSION: Persistent but improving left basilar infiltrate. Small left effusion. Electronically Signed   By: Rolm Baptise M.D.   On: 09/13/2016 09:31     ASSESSMENT AND PLAN:   66 year old male with  ESRD on hemodialysis, diabetes and essential hypertension who presents with sepsis.  1.  Escherichia coli Sepsis present on admission: Patient presented with tachycardia, tachypnea and leukocytosis Sepsis from pneumonia and Escherichia coli bacteremia from choledocholithiasis Appreciate ID consult Continue oral ciprofloxacin as per sensitivities 2. Cholangitis with Elevated LFTs with chronic elevation in LFTs: LFTs have improved  MRCP showed choledocholithiasis  ERCP with stent in CBD on 09/10/16 Plan for Laproscopic Cholecyctectomy tomorrow if WBC improved  3. ESRD on hemodialysis: Continue dialysis as per nephrology   4. Hyperkalemia: This has  resolved   4. Diabetes: Continue sliding scale insulin 5. Chronic diastolic heart failure : no evidence of exacerbation  6. Acute pancreatitis from ercp which has resolved Clear liquid diet today and NPO after MN    Management plans discussed with the patient and wife and he is in agreement.  CODE STATUS: full  TOTAL TIME TAKING CARE OF THIS PATIENT: 21 minutes.   POSSIBLE D/C 2-3  days, DEPENDING ON CLINICAL CONDITION.   Laureen Frederic M.D on 09/13/2016 at 10:32 AM  Between 7am to 6pm - Pager - [phone] After 6pm go to [email or web address] - password [email or web address] [facility]  Office  [phone]  CC: Primary care physician; [name], MD  Note: This dictation was prepared with Dragon dictation along with smaller phrase technology. Any transcriptional errors that result from this process are unintentional.

## 2016-09-13 NOTE — Progress Notes (Signed)
Initial Nutrition Assessment  DOCUMENTATION CODES:   Not applicable  INTERVENTION:  Recommend Boost Breeze po TID with meals, each supplement provides 250 kcal and 9 grams of protein. Patient now NPO again so will order with diet advancement.  Encouraged adequate intake of calories and protein once diet able to be advanced.  NUTRITION DIAGNOSIS:   Inadequate oral intake related to inability to eat as evidenced by NPO status.  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   -Per chart patient started HD on 09/29/2014, was briefly on PD from 02/2014-08/2014 and since has remained on HD.   Spoke with patient and his daughter at bedside. Per chart patient speaks [place] but he is able to communicate in [place] and daughter helped with questions about weight history. Patient reports his appetite was poor for approximately 2 weeks PTA due to early satiety and nausea. He reports attempting to eat 2 meals daily, but was not able to finish his meals. Patient reports he cannot remember further details on intake. Patient was originally on Soft diet or Renal diet this admission, but since 3/26 has been NPO or CLD. He reports he is hungry now.   Patient reports his body weight recently has been around 150 lbs and he has not lost any weight. Noted two years ago patient weighed 120 lbs, but when discussed with patient he could not remember previous weights. Per chart patient was 159.2 lbs on  07/27/2016 and has lost 5.3 lbs (3.3% body weight) over 1 month, which is not significant for time frame.  Medications reviewed and include: allopurinol, ciprofloxacin, Novolog sliding scale TID with meals and daily at bedtime, pantoprazole.  Labs reviewed: CBG 81-148 past 24 hrs, Potassium 5.2 (trending up from 4.5 on 3/29), Chloride 94, BUN 33, Creatinine 10.2, Lipase 67 (trending down from 4082 on 3/28).  Nutrition-Focused physical exam completed. Findings are no fat depletion, mild muscle depletion (noted only in dorsal hand), and no edema.   Discussed with RN.   Diet Order:  Diet NPO time specified  Skin:  Reviewed, no issues  Last BM:  09/12/2016  Height:   Ht Readings from Last 1 Encounters:  09/04/16 4\' 9"  (1.448 m)    Weight:   Wt Readings from Last 1 Encounters:  09/12/16 153 lb 14.1 oz (69.8 kg)    Ideal Body Weight:  43.2 kg  BMI:  Body mass index is 33.3 kg/m.  Estimated Nutritional Needs:   Kcal:  1675-1930 (MSJ x 1.3-1.5)  Protein:  85-100 grams (1.2-1.4 grams/kg)  Fluid:  2 L/day (30 ml/kg)  EDUCATION NEEDS:   No education needs identified at this time  Willey Blade, MS, RD, LDN Pager: [phone] After Hours Pager: [phone]

## 2016-09-13 NOTE — Progress Notes (Signed)
[facility]  ROUNDING NOTE   Subjective:   Patient Has developed pancreatitis after ERCP Does not report any shortness of breath at present  family member at bedside Patient feels better overall    Objective:  Vital signs in last 24 hours:  Temp:  [97.7 F (36.5 C)-98.6 F (37 C)] 98.2 F (36.8 C) (03/30 1215) Pulse Rate:  [75-89] 75 (03/30 1157) Resp:  [16-18] 18 (03/30 1157) BP: (142-151)/(58-78) 142/78 (03/30 1157) SpO2:  [96 %-97 %] 96 % (03/30 1157)  Weight change:  Filed Weights   09/11/16 0531 09/12/16 1015 09/12/16 1220  Weight: 69.1 kg (152 lb 4.8 oz) 69.7 kg (153 lb 10.6 oz) 69.8 kg (153 lb 14.1 oz)    Intake/Output: I/O last 3 completed shifts: In: 575.3 [I.V.:575.3] Out: -185    Intake/Output this shift:  No intake/output data recorded.  Physical Exam: General: NAD,   Head: Normocephalic, atraumatic. Moist oral mucosal membranes  ENT: moist mucus membranes  Neck: Supple, trachea midline  Lungs:  Clear to auscultation  Heart: Regular rate and rhythm  Abdomen:  Soft, nontender,   Extremities: no peripheral edema.  Neurologic: Nonfocal, moving all four extremities  Skin: No lesions  Access: Left AVF    Basic Metabolic Panel:  Recent Labs Lab 09/09/16 0401 09/10/16 0601 09/11/16 0417 09/12/16 0435 09/13/16 0624  NA 139 137 138 138 135  K 5.0 4.7 4.5 4.5 5.2*  CL 96* 94* 94* 95* 94*  CO2 28 28 28 26 26   GLUCOSE 122* 106* 124* 102* 106*  BUN 39* 24* 37* 45* 33*  CREATININE 9.82* 7.78* 10.13* 12.03* 10.20*  CALCIUM 8.5* 8.4* 7.9* 7.0* 7.3*    Liver Function Tests:  Recent Labs Lab 09/08/16 0751 09/09/16 0401 09/10/16 0601 09/11/16 0417 09/12/16 0435  AST 55* 41 43* 42* 32  ALT 113* 86* 68* 57 38  ALKPHOS 238* 241* 209* 203* 165*  BILITOT 1.9* 1.5* 1.6* 1.5* 1.1  PROT 8.2* 8.1 8.1 8.3* 7.6  ALBUMIN 3.5 3.5 3.6 3.6 3.2*    Recent Labs Lab 09/10/16 0601 09/11/16 0417 09/12/16 0435 09/13/16 0624  LIPASE 47  4,082* 1,133* 67*   No results for input(s): AMMONIA in the last 168 hours.  CBC:  Recent Labs Lab 09/07/16 0330 09/08/16 0751 09/09/16 0401 09/13/16 0624  WBC 8.0 6.8 6.7 17.5*  HGB 11.0* 12.1* 12.2* 10.5*  HCT 34.9* 38.5* 38.4* 33.4*  MCV 75.0* 74.9* 73.9* 73.3*  PLT 194 222 235 229    Cardiac Enzymes: No results for input(s): CKTOTAL, CKMB, CKMBINDEX, TROPONINI in the last 168 hours.  BNP: Invalid input(s): POCBNP  CBG:  Recent Labs Lab 09/12/16 1325 09/12/16 1628 09/12/16 2109 09/13/16 0735 09/13/16 1157  GLUCAP 81 136* 148* 110* 110*    Microbiology: Results for orders placed or performed during the hospital encounter of 09/04/16  Blood culture (routine x 2)     Status: Abnormal   Collection Time: 09/04/16  9:01 PM  Result Value Ref Range Status   Specimen Description BLOOD RIGHT FOREARM  Final   Special Requests BOTTLES DRAWN AEROBIC AND ANAEROBIC  BCHV  Final   Culture  Setup Time   Final    GRAM NEGATIVE RODS IN BOTH AEROBIC AND ANAEROBIC BOTTLES CRITICAL RESULT CALLED TO, READ BACK BY AND VERIFIED WITH: HANK ZOMPA 09/05/16 1128 SGD    Culture (A)  Final    ESCHERICHIA COLI SUSCEPTIBILITIES PERFORMED ON PREVIOUS CULTURE WITHIN THE LAST 5 DAYS. Performed at [facility], [address] [place], [place] [address]    Report Status 09/07/2016 FINAL  Final  Blood culture (routine x 2)     Status: Abnormal   Collection Time: 09/04/16  9:01 PM  Result Value Ref Range Status   Specimen Description BLOOD RIGHT ANTECUBITAL  Final   Special Requests BOTTLES DRAWN AEROBIC AND ANAEROBIC  BCHV  Final   Culture  Setup Time   Final    GRAM NEGATIVE RODS IN BOTH AEROBIC AND ANAEROBIC BOTTLES CRITICAL RESULT CALLED TO, READ BACK BY AND VERIFIED WITH: HANK ZOMPA 09/05/16 1128 SGD Performed at [facility], [place] [address], [place],  [address]    Culture ESCHERICHIA COLI (A)  Final   Report Status 09/07/2016 FINAL  Final   Organism ID, Bacteria  ESCHERICHIA COLI  Final      Susceptibility   Escherichia coli - MIC*    AMPICILLIN <=2 SENSITIVE Sensitive     CEFAZOLIN <=4 SENSITIVE Sensitive     CEFEPIME <=1 SENSITIVE Sensitive     CEFTAZIDIME <=1 SENSITIVE Sensitive     CEFTRIAXONE <=1 SENSITIVE Sensitive     CIPROFLOXACIN <=0.25 SENSITIVE Sensitive     GENTAMICIN <=1 SENSITIVE Sensitive     IMIPENEM <=0.25 SENSITIVE Sensitive     TRIMETH/SULFA <=20 SENSITIVE Sensitive     AMPICILLIN/SULBACTAM <=2 SENSITIVE Sensitive     PIP/TAZO <=4 SENSITIVE Sensitive     Extended ESBL NEGATIVE Sensitive     * ESCHERICHIA COLI  Blood Culture ID Panel (Reflexed)     Status: Abnormal   Collection Time: 09/04/16  9:01 PM  Result Value Ref Range Status   Enterococcus species NOT DETECTED NOT DETECTED Final   Listeria monocytogenes NOT DETECTED NOT DETECTED Final   Staphylococcus species NOT DETECTED NOT DETECTED Final   Staphylococcus aureus NOT DETECTED NOT DETECTED Final   Streptococcus species NOT DETECTED NOT DETECTED Final   Streptococcus agalactiae NOT DETECTED NOT DETECTED Final   Streptococcus pneumoniae NOT DETECTED NOT DETECTED Final   Streptococcus pyogenes NOT DETECTED NOT DETECTED Final   Acinetobacter baumannii NOT DETECTED NOT DETECTED Final   Enterobacteriaceae species DETECTED (A) NOT DETECTED Final    Comment: Enterobacteriaceae represent a large family of gram-negative bacteria, not a single organism. CRITICAL RESULT CALLED TO, READ BACK BY AND VERIFIED WITH: HANK ZOMPA 09/05/16 1128 SGD    Enterobacter cloacae complex NOT DETECTED NOT DETECTED Final   Escherichia coli DETECTED (A) NOT DETECTED Final    Comment: CRITICAL RESULT CALLED TO, READ BACK BY AND VERIFIED WITH: HANK ZOMPA 09/05/16 1128 SGD    Klebsiella oxytoca NOT DETECTED NOT DETECTED Final   Klebsiella pneumoniae NOT DETECTED NOT DETECTED Final   Proteus species NOT DETECTED NOT DETECTED Final   Serratia marcescens NOT DETECTED NOT DETECTED Final    Carbapenem resistance NOT DETECTED NOT DETECTED Final   Haemophilus influenzae NOT DETECTED NOT DETECTED Final   Neisseria meningitidis NOT DETECTED NOT DETECTED Final   Pseudomonas aeruginosa NOT DETECTED NOT DETECTED Final   Candida albicans NOT DETECTED NOT DETECTED Final   Candida glabrata NOT DETECTED NOT DETECTED Final   Candida krusei NOT DETECTED NOT DETECTED Final   Candida parapsilosis NOT DETECTED NOT DETECTED Final   Candida tropicalis NOT DETECTED NOT DETECTED Final  MRSA PCR Screening     Status: None   Collection Time: 09/04/16 11:14 PM  Result Value Ref Range Status   MRSA by PCR NEGATIVE NEGATIVE Final    Comment:  The GeneXpert MRSA Assay (FDA approved for NASAL specimens only), is one component of a comprehensive MRSA colonization surveillance program. It is not intended to diagnose MRSA infection nor to guide or monitor treatment for MRSA infections.     Coagulation Studies: No results for input(s): LABPROT, INR in the last 72 hours.  Urinalysis: No results for input(s): COLORURINE, LABSPEC, PHURINE, GLUCOSEU, HGBUR, BILIRUBINUR, KETONESUR, PROTEINUR, UROBILINOGEN, NITRITE, LEUKOCYTESUR in the last 72 hours.  Invalid input(s): APPERANCEUR    Imaging: Dg Chest 1 View  Result Date: 09/13/2016 CLINICAL DATA:  Pneumonia. EXAM: CHEST 1 VIEW COMPARISON:  09/04/2016 FINDINGS: Continued consolidation at the left lung base, improved since prior study. Possible small left effusion. Right lung is clear. Mild cardiomegaly. Prior median sternotomy. IMPRESSION: Persistent but improving left basilar infiltrate. Small left effusion. Electronically Signed   By: Rolm Baptise M.D.   On: 09/13/2016 09:31     Medications:    . allopurinol  100 mg Oral Daily  . aspirin EC  81 mg Oral Daily  . atorvastatin  40 mg Oral q1800  . [START ON 09/14/2016] ciprofloxacin  500 mg Oral Q24H  . heparin  5,000 Units Subcutaneous Q8H  . indomethacin  100 mg Rectal Once  .  indomethacin  100 mg Rectal Once  . insulin aspart  0-5 Units Subcutaneous QHS  . insulin aspart  0-9 Units Subcutaneous TID WC  . levETIRAcetam  1,000 mg Oral Daily  . levETIRAcetam  250 mg Oral Q T,Th,Sa-HD  . pantoprazole  40 mg Oral Daily   morphine injection, ondansetron **OR** ondansetron (ZOFRAN) IV, oxyCODONE  Assessment/ Plan:  [name] is a 66 y.o. 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   TTS [facility]  1. End Stage Renal Disease with hyperkalemia: TTS schedule. Next HD tomorrow  2.  Secondary Hyperparathyroidism: with hyperphosphatemia.  - Not currently on binders.   3. Anemia of chronic kidney disease:  hemoglobin 10.5 Start low dose EPO  4. Common bile duct stone with Escherichia coli bacteremia. ERCP with stent placement done on 3/27 Postoperative complications of pancreatitis Cholecystectomy pending      LOS: 9 Arda Keadle 3/30/20184:50 PM

## 2016-09-13 NOTE — Progress Notes (Signed)
Telephone order from Dr. Benjie Karvonen to switch patient from IV Ciprofloxacin to PO.  Currently ESRD on dialysis, will change to 500mg  PO q24hrs to start tomorrow.  To be given after dialysis on Dialysis days. Prudy Feeler, RPh 09/13/16

## 2016-09-13 NOTE — Progress Notes (Signed)
Pharmacy Antibiotic Note  [name] is a 66 y.o. male admitted on 09/04/2016 with pneumonia and E.Coli bacteremia. Patient on meropenem and cefepime. Culture results showing sensitivities to ciprofloxacin. Pharmacy  consulted for Cipro dosing.  Plan: Will continue Ciprofloxacin 400 mg IV Daily, to be given after dialysis on dialysis days.   Height: 4\' 9"  (144.8 cm) Weight: 153 lb 14.1 oz (69.8 kg) IBW/kg (Calculated) : 43.1  Temp (24hrs), Avg:98.4 F (36.9 C), Min:97.7 F (36.5 C), Max:99.3 F (37.4 C)   Recent Labs Lab 09/06/16 1152  09/07/16 0330 09/08/16 0751 09/09/16 0401 09/10/16 0601 09/11/16 0417 09/12/16 0435 09/13/16 0624  WBC  --   --  8.0 6.8 6.7  --   --   --  17.5*  CREATININE  --   < > 10.00* 8.07* 9.82* 7.78* 10.13* 12.03* 10.20*  VANCORANDOM 18  --   --   --   --   --   --   --   --   < > = values in this interval not displayed.  Estimated Creatinine Clearance: 5.5 mL/min (A) (by C-G formula based on SCr of 10.2 mg/dL (H)).    Allergies  Allergen Reactions  . Ivp Dye [Iodinated Diagnostic Agents] Other (See Comments)    Pt denied    Antimicrobials this admission:  Dose adjustments this admission:  Microbiology results: 3/21 BCx: Ecoli 3/21 MRSA PCR: negative   [name] D, PharmD, BCPS Clinical Pharmacist 09/13/2016 10:32 AM

## 2016-09-13 NOTE — Progress Notes (Signed)
Patient ID: [name], male   DOB: 05-19-1951, 66 y.o.   MRN: [id number] Pt states he is feeling better. No new complaints. Abdomen is soft, good bs. No tenderness. Lungs -decreased breath sounds left base. Lipase is down to normal but WBC is 17.5K. Chest CT from few days ago showed LLL pneumonia/atelectasis. Discussed wit Dr. Benjie Karvonen. Will hold off on surgery  for now till pulmonary status improves

## 2016-09-14 LAB — GLUCOSE, CAPILLARY: Glucose-Capillary: 107 mg/dL — ABNORMAL HIGH (ref 65–99)

## 2016-09-14 LAB — CBC
HCT: 36.4 % — ABNORMAL LOW (ref 40.0–52.0)
Hemoglobin: 11.3 g/dL — ABNORMAL LOW (ref 13.0–18.0)
MCH: 23.3 pg — ABNORMAL LOW (ref 26.0–34.0)
MCHC: 31.1 g/dL — ABNORMAL LOW (ref 32.0–36.0)
MCV: 74.7 fL — ABNORMAL LOW (ref 80.0–100.0)
Platelets: 253 K/uL (ref 150–440)
RBC: 4.87 MIL/uL (ref 4.40–5.90)
RDW: 18.7 % — ABNORMAL HIGH (ref 11.5–14.5)
WBC: 15.8 K/uL — ABNORMAL HIGH (ref 3.8–10.6)

## 2016-09-14 MED ORDER — PROMETHAZINE HCL 25 MG/ML IJ SOLN
12.5000 mg | Freq: Four times a day (QID) | INTRAMUSCULAR | Status: DC | PRN
  Administered 2016-09-14: 12.5 mg via INTRAVENOUS
  Filled 2016-09-14 (×2): qty 1

## 2016-09-14 MED ORDER — CIPROFLOXACIN HCL 500 MG PO TABS: 500.0000 mg | ORAL_TABLET | ORAL | 0 refills | Status: DC

## 2016-09-14 MED ORDER — ATORVASTATIN CALCIUM 40 MG PO TABS: 80.0000 mg | ORAL_TABLET | Freq: Every day | ORAL | 0 refills | Status: DC

## 2016-09-14 MED ORDER — CIPROFLOXACIN HCL 500 MG PO TABS
500.0000 mg | ORAL_TABLET | ORAL | 0 refills | Status: AC
Start: 2016-09-14 — End: 2016-09-21

## 2016-09-14 NOTE — Progress Notes (Signed)
Pt d/c home; d/c instructions reviewed w/ pt; pt understanding was verbalized; IV was removed during prior shift; all pt questions answered; pt verbalized that all pt belongings were accounted for; pt left unit via wheelchair accompanied by staff

## 2016-09-14 NOTE — Progress Notes (Signed)
Post hd vitals 

## 2016-09-14 NOTE — Discharge Instructions (Signed)
Follow all MD discharge instructions. Take all medications as prescribed. Keep all follow up appointments. If your symptoms return, call your doctor. If you experience any new symptoms that are of concern to you or that are bothersome to you, call your doctor. For all questions and/or concerns, call your doctor. ° ° If you have a medical emergency, call 911 ° ° ° °

## 2016-09-14 NOTE — Progress Notes (Signed)
Patient ID: [name], male   DOB: 1951/01/04, 66 y.o.   MRN: [id number] Patient with no complaints of abdominal pain. His white count still high at 15,000. Chest x-ray estimated again reveals notable consolidation of the left lung base. Given the fact that he has no stones in the gallbladder at this time and the fact that the he has ongoing process and along it is unsafe to put him to sleep for a cholecystectomy. I did discuss this with anesthesiologist who felt that unless it was an urgent issue this would be better done once his pulmonary status clears. It is okay to advance his diet and possibly discharge him and arrangements can be made for outpatient cholecystectomy

## 2016-09-14 NOTE — Discharge Summary (Signed)
[facility] NAME: [name]    MR#:  [id number]  DATE OF BIRTH:  January 27, 1951  DATE OF ADMISSION:  09/04/2016 ADMITTING PHYSICIAN: Lance Coon, MD  DATE OF DISCHARGE: 09/14/2016  PRIMARY CARE PHYSICIAN: Singh,Jasmine, MD    ADMISSION DIAGNOSIS:  Lactic acidosis [E87.2] Elevated LFTs [R79.89] HCAP (healthcare-associated pneumonia) [J18.9] Sepsis, due to unspecified organism ([place]) [A41.9]  DISCHARGE DIAGNOSIS:  Principal Problem:   Sepsis ([place]) Active Problems:   ESRD on dialysis ([place])   Diabetes ([place])   HTN (hypertension)   GERD (gastroesophageal reflux disease)   Chronic diastolic CHF (congestive heart failure) ([place])   Transaminitis   CAP (community acquired pneumonia)   Choledocholithiasis   Calculus of bile duct without cholecystitis with obstruction   SECONDARY DIAGNOSIS:   Past Medical History:  Diagnosis Date  . Chronic diastolic congestive heart failure ([place])   . Chronic disease anemia   . ESRD (end stage renal disease) on dialysis ([place])    "[place]; [place]; [name]" (09/29/2014)  . GERD (gastroesophageal reflux disease)   . Gout   . High cholesterol   . History of blood transfusion    "related to anemia"  . History of stomach ulcers   . Hypertension   . Type II diabetes mellitus [facility])     HOSPITAL COURSE:   66 year old male with ESRD on hemodialysis, diabetes and essential hypertension who presents with sepsis.  1. Escherichia coli Sepsis present on admission: Patient presented with tachycardia, tachypnea and leukocytosis Sepsis from CAP and Escherichia coli bacteremia from choledocholithiasis  He will continue oral ciprofloxacin as per sensitivities for a total of 14 days.  2. Cholangitis with Elevated LFTs with chronic elevation in LFTs: LFTs have improved since admission. MRCP showed choledocholithiasis  He underwent ERCP with stent in CBD on 09/10/16 Inguinal or cholecystectomy. However due to  persistent pneumonia anesthesiology was reluctant for general anesthesia in this patient with high risk. He has no stones in CBD and surgery felt that patient should have an elective cholecystectomy. He will follow up with Dr. Dahlia Byes after complete treatment of pneumonia and cholangitis.  3. ESRD on hemodialysis: He will continue dialysis Tuesday, Thursday and Saturday.   4. Hyperkalemia: This has  resolved   4. Diabetes: He may resume ADA diet and outpatient medications.   5. Chronic diastolic heart failure : no evidence of exacerbation  6. Acute pancreatitis after ercp which has resolved    DISCHARGE CONDITIONS AND DIET:   Stable Renal diet  CONSULTS OBTAINED:  Treatment Team:  Leonel Ramsay, MD Rohini Raeanne Gathers, MD Jules Husbands, MD Teodoro Spray, MD [name], MD Vaughan Basta, MD  DRUG ALLERGIES:   Allergies  Allergen Reactions  . Ivp Dye [Iodinated Diagnostic Agents] Other (See Comments)    Pt denied    DISCHARGE MEDICATIONS:   Current Discharge Medication List    START taking these medications   Details  ciprofloxacin (CIPRO) 500 MG tablet Take 1 tablet (500 mg total) by mouth daily. Qty: 7 tablet, Refills: 0      CONTINUE these medications which have CHANGED   Details  atorvastatin (LIPITOR) 40 MG tablet Take 2 tablets (80 mg total) by mouth daily at 6 PM. Qty: 30 tablet, Refills: 0      CONTINUE these medications which have NOT CHANGED   Details  acetaminophen (TYLENOL) 325 MG tablet Take 2 tablets (650 mg total) by mouth every 6 (six) hours as needed for mild pain (  or Fever >/= 101). Qty: 30 tablet, Refills: 0    albuterol (PROVENTIL) (2.5 MG/3ML) 0.083% nebulizer solution Take 2.5 mg by nebulization every 6 (six) hours as needed for wheezing or shortness of breath.    allopurinol (ZYLOPRIM) 100 MG tablet Take 100 mg by mouth daily. Refills: 10    aspirin 81 MG EC tablet Take 81 mg by mouth daily. Refills: 10    B  Complex-C (B-COMPLEX WITH VITAMIN C) tablet Take 1 tablet by mouth daily.    clopidogrel (PLAVIX) 75 MG tablet Take 1 tablet (75 mg total) by mouth daily with breakfast. Qty: 30 tablet, Refills: 11    epoetin alfa (EPOGEN,PROCRIT) 4000 UNIT/ML injection Inject 4,000 Units into the vein.    !! levETIRAcetam (KEPPRA) 1000 MG tablet Take 1 tablet (1,000 mg total) by mouth daily. Qty: 60 tablet, Refills: 1    !! levETIRAcetam (KEPPRA) 250 MG tablet Take on the Days on Dialysis (Tues, Thurs, Sat) after Dialysis. Qty: 60 tablet, Refills: 1    meclizine (ANTIVERT) 12.5 MG tablet Take 12.5 mg by mouth 3 (three) times daily as needed.    mirtazapine (REMERON) 7.5 MG tablet Take 7.5 mg by mouth at bedtime.    nitroGLYCERIN (NITROSTAT) 0.4 MG SL tablet Place 1 tablet (0.4 mg total) under the tongue every 5 (five) minutes x 3 doses as needed for chest pain. Qty: 25 tablet, Refills: 12    pantoprazole (PROTONIX) 40 MG tablet Take 40 mg by mouth daily.    heparin 100-0.45 UNIT/ML-% infusion Inject 1,000 Units/hr into the vein continuous. Qty: 250 mL, Refills: 0    Melatonin 3 MG TABS Take 3 mg by mouth at bedtime.    multivitamin (RENA-VIT) TABS tablet Take 1 tablet by mouth at bedtime. Qty: 30 tablet, Refills: 3     !! - Potential duplicate medications found. Please discuss with provider.    STOP taking these medications     nitroGLYCERIN (NITROGLYN) 2 % ointment           Today   CHIEF COMPLAINT:  Patient is hungry   VITAL SIGNS:  Blood pressure (!) 150/75, pulse 81, temperature 98.1 F (36.7 C), temperature source Oral, resp. rate 18, height 4\' 9"  (1.448 m), weight 68.7 kg (151 lb 7.3 oz), SpO2 98 %.   REVIEW OF SYSTEMS:  Review of Systems  Constitutional: Negative.  Negative for chills, fever and malaise/fatigue.  HENT: Negative.  Negative for ear discharge, ear pain, hearing loss, nosebleeds and sore throat.   Eyes: Negative.  Negative for blurred vision and pain.   Respiratory: Negative.  Negative for cough, hemoptysis, shortness of breath and wheezing.   Cardiovascular: Negative.  Negative for chest pain, palpitations and leg swelling.  Gastrointestinal: Negative.  Negative for abdominal pain, blood in stool, diarrhea, nausea and vomiting.  Genitourinary: Negative.  Negative for dysuria.  Musculoskeletal: Negative.  Negative for back pain.  Skin: Negative.   Neurological: Negative for dizziness, tremors, speech change, focal weakness, seizures and headaches.  Endo/Heme/Allergies: Negative.  Does not bruise/bleed easily.  Psychiatric/Behavioral: Negative.  Negative for depression, hallucinations and suicidal ideas.     PHYSICAL EXAMINATION:  GENERAL:  66 y.o.-year-old patient lying in the bed with no acute distress.  NECK:  Supple, no jugular venous distention. No thyroid enlargement, no tenderness.  LUNGS: Normal breath sounds bilaterally, no wheezing, rales,rhonchi  No use of accessory muscles of respiration.  CARDIOVASCULAR: S1, S2 normal. No murmurs, rubs, or gallops.  ABDOMEN: Soft, non-tender, non-distended. Bowel sounds present.  No organomegaly or mass.  EXTREMITIES: No pedal edema, cyanosis, or clubbing.  PSYCHIATRIC: The patient is alert and oriented x 3.  SKIN: No obvious rash, lesion, or ulcer.   DATA REVIEW:   CBC  Recent Labs Lab 09/14/16 0336  WBC 15.8*  HGB 11.3*  HCT 36.4*  PLT 253    Chemistries   Recent Labs Lab 09/12/16 0435 09/13/16 0624  NA 138 135  K 4.5 5.2*  CL 95* 94*  CO2 26 26  GLUCOSE 102* 106*  BUN 45* 33*  CREATININE 12.03* 10.20*  CALCIUM 7.0* 7.3*  AST 32  --   ALT 38  --   ALKPHOS 165*  --   BILITOT 1.1  --     Cardiac Enzymes No results for input(s): TROPONINI in the last 168 hours.  Microbiology Results  @MICRORSLT48 @  RADIOLOGY:  Dg Chest 1 View  Result Date: 09/13/2016 CLINICAL DATA:  Pneumonia. EXAM: CHEST 1 VIEW COMPARISON:  09/04/2016 FINDINGS: Continued consolidation at  the left lung base, improved since prior study. Possible small left effusion. Right lung is clear. Mild cardiomegaly. Prior median sternotomy. IMPRESSION: Persistent but improving left basilar infiltrate. Small left effusion. Electronically Signed   By: Rolm Baptise M.D.   On: 09/13/2016 09:31      Current Discharge Medication List    START taking these medications   Details  ciprofloxacin (CIPRO) 500 MG tablet Take 1 tablet (500 mg total) by mouth daily. Qty: 7 tablet, Refills: 0      CONTINUE these medications which have CHANGED   Details  atorvastatin (LIPITOR) 40 MG tablet Take 2 tablets (80 mg total) by mouth daily at 6 PM. Qty: 30 tablet, Refills: 0      CONTINUE these medications which have NOT CHANGED   Details  acetaminophen (TYLENOL) 325 MG tablet Take 2 tablets (650 mg total) by mouth every 6 (six) hours as needed for mild pain (or Fever >/= 101). Qty: 30 tablet, Refills: 0    albuterol (PROVENTIL) (2.5 MG/3ML) 0.083% nebulizer solution Take 2.5 mg by nebulization every 6 (six) hours as needed for wheezing or shortness of breath.    allopurinol (ZYLOPRIM) 100 MG tablet Take 100 mg by mouth daily. Refills: 10    aspirin 81 MG EC tablet Take 81 mg by mouth daily. Refills: 10    B Complex-C (B-COMPLEX WITH VITAMIN C) tablet Take 1 tablet by mouth daily.    clopidogrel (PLAVIX) 75 MG tablet Take 1 tablet (75 mg total) by mouth daily with breakfast. Qty: 30 tablet, Refills: 11    epoetin alfa (EPOGEN,PROCRIT) 4000 UNIT/ML injection Inject 4,000 Units into the vein.    !! levETIRAcetam (KEPPRA) 1000 MG tablet Take 1 tablet (1,000 mg total) by mouth daily. Qty: 60 tablet, Refills: 1    !! levETIRAcetam (KEPPRA) 250 MG tablet Take on the Days on Dialysis (Tues, Thurs, Sat) after Dialysis. Qty: 60 tablet, Refills: 1    meclizine (ANTIVERT) 12.5 MG tablet Take 12.5 mg by mouth 3 (three) times daily as needed.    mirtazapine (REMERON) 7.5 MG tablet Take 7.5 mg by mouth  at bedtime.    nitroGLYCERIN (NITROSTAT) 0.4 MG SL tablet Place 1 tablet (0.4 mg total) under the tongue every 5 (five) minutes x 3 doses as needed for chest pain. Qty: 25 tablet, Refills: 12    pantoprazole (PROTONIX) 40 MG tablet Take 40 mg by mouth daily.    heparin 100-0.45 UNIT/ML-% infusion Inject 1,000 Units/hr into the vein continuous. Qty:  250 mL, Refills: 0    Melatonin 3 MG TABS Take 3 mg by mouth at bedtime.    multivitamin (RENA-VIT) TABS tablet Take 1 tablet by mouth at bedtime. Qty: 30 tablet, Refills: 3     !! - Potential duplicate medications found. Please discuss with provider.    STOP taking these medications     nitroGLYCERIN (NITROGLYN) 2 % ointment           Management plans discussed with the patient and he is in agreement. Stable for discharge home  Patient should follow up with pcp and dr pabon  CODE STATUS:     Code Status Orders        Start     Ordered   09/04/16 2337  Full code  Continuous     09/04/16 2336    Code Status History    Date Active Date Inactive Code Status Order ID Comments User Context   07/24/2016  2:19 AM 07/29/2016  8:15 PM Full Code [id number]  Harvie Bridge, DO Inpatient   02/26/2016  6:03 AM 02/27/2016  5:05 PM Full Code [id number]  Harrie Foreman, MD Inpatient   02/26/2016  5:40 AM 02/26/2016  6:03 AM DNR [id number]  Harrie Foreman, MD Inpatient   02/14/2015  8:16 AM 02/17/2015  1:47 PM DNR [id number]  Epifanio Lesches, MD ED   12/12/2014  8:44 PM 12/16/2014  7:26 PM DNR [id number]  Loletha Grayer, MD ED   11/05/2014 10:09 PM 11/09/2014  9:48 PM Full Code [id number]  Henreitta Leber, MD Inpatient   09/30/2014  1:48 PM 10/02/2014  7:02 PM Full Code [id number]  [name], MD Inpatient   09/29/2014  8:36 PM 09/30/2014  1:48 PM Full Code [id number]  [name], MD Inpatient      TOTAL TIME TAKING CARE OF THIS PATIENT: 37 minutes.    Note: This dictation was prepared with Dragon dictation along with smaller phrase  technology. Any transcriptional errors that result from this process are unintentional.  Yarenis Cerino M.D on 09/14/2016 at 10:26 AM  Between 7am to 6pm - Pager - [phone] After 6pm go to [email or web address] - password [email or web address]  Office  [phone]  CC: Primary care physician; [name], MD

## 2016-09-14 NOTE — Progress Notes (Signed)
Post hd assessment 

## 2016-09-14 NOTE — Progress Notes (Signed)
Hd start 

## 2016-09-14 NOTE — Progress Notes (Signed)
[facility]  ROUNDING NOTE   Subjective:   Patient developed pancreatitis after ERCP  Patient seen during dialysis Tolerating well   HEMODIALYSIS FLOWSHEET:  Blood Flow Rate (mL/min): 400 mL/min Arterial Pressure (mmHg): -100 mmHg Venous Pressure (mmHg): 280 mmHg Transmembrane Pressure (mmHg): 40 mmHg Ultrafiltration Rate (mL/min): 170 mL/min Dialysate Flow Rate (mL/min): 800 ml/min Conductivity: Machine : 14 Conductivity: Machine : 14 Dialysis Fluid Bolus: Normal Saline Bolus Amount (mL): 250 mL (prime) Dialysate Change: 2K Intra-Hemodialysis Comments: hd start, pt alert, c/o of nausea      Objective:  Vital signs in last 24 hours:  Temp:  [98.1 F (36.7 C)-99.1 F (37.3 C)] 99.1 F (37.3 C) (03/31 1128) Pulse Rate:  [74-84] 84 (03/31 1130) Resp:  [17-20] 17 (03/31 1130) BP: (114-162)/(65-82) 153/82 (03/31 1130) SpO2:  [88 %-98 %] 91 % (03/31 1130) Weight:  [68.7 kg (151 lb 7.3 oz)-69.1 kg (152 lb 5.4 oz)] 69.1 kg (152 lb 5.4 oz) (03/31 1128)  Weight change: -1 kg (-2 lb 3.3 oz) Filed Weights   09/12/16 1220 09/14/16 0358 09/14/16 1128  Weight: 69.8 kg (153 lb 14.1 oz) 68.7 kg (151 lb 7.3 oz) 69.1 kg (152 lb 5.4 oz)    Intake/Output: No intake/output data recorded.   Intake/Output this shift:  No intake/output data recorded.  Physical Exam: General: NAD,   Head: Normocephalic, atraumatic. Moist oral mucosal membranes  ENT: moist mucus membranes  Neck: Supple, trachea midline  Lungs:  Clear to auscultation  Heart: Regular rate and rhythm  Abdomen:  Soft, nontender,   Extremities: no peripheral edema.  Neurologic: Nonfocal, moving all four extremities  Skin: No lesions  Access: Left AVF    Basic Metabolic Panel:  Recent Labs Lab 09/09/16 0401 09/10/16 0601 09/11/16 0417 09/12/16 0435 09/13/16 0624  NA 139 137 138 138 135  K 5.0 4.7 4.5 4.5 5.2*  CL 96* 94* 94* 95* 94*  CO2 28 28 28 26 26   GLUCOSE 122* 106* 124* 102* 106*  BUN  39* 24* 37* 45* 33*  CREATININE 9.82* 7.78* 10.13* 12.03* 10.20*  CALCIUM 8.5* 8.4* 7.9* 7.0* 7.3*    Liver Function Tests:  Recent Labs Lab 09/08/16 0751 09/09/16 0401 09/10/16 0601 09/11/16 0417 09/12/16 0435  AST 55* 41 43* 42* 32  ALT 113* 86* 68* 57 38  ALKPHOS 238* 241* 209* 203* 165*  BILITOT 1.9* 1.5* 1.6* 1.5* 1.1  PROT 8.2* 8.1 8.1 8.3* 7.6  ALBUMIN 3.5 3.5 3.6 3.6 3.2*    Recent Labs Lab 09/10/16 0601 09/11/16 0417 09/12/16 0435 09/13/16 0624  LIPASE 47 4,082* 1,133* 67*   No results for input(s): AMMONIA in the last 168 hours.  CBC:  Recent Labs Lab 09/08/16 0751 09/09/16 0401 09/13/16 0624 09/14/16 0336  WBC 6.8 6.7 17.5* 15.8*  HGB 12.1* 12.2* 10.5* 11.3*  HCT 38.5* 38.4* 33.4* 36.4*  MCV 74.9* 73.9* 73.3* 74.7*  PLT 222 235 229 253    Cardiac Enzymes: No results for input(s): CKTOTAL, CKMB, CKMBINDEX, TROPONINI in the last 168 hours.  BNP: Invalid input(s): POCBNP  CBG:  Recent Labs Lab 09/13/16 0735 09/13/16 1157 09/13/16 1649 09/13/16 2148 09/14/16 0753  GLUCAP 110* 110* 117* 103* 107*    Microbiology: Results for orders placed or performed during the hospital encounter of 09/04/16  Blood culture (routine x 2)     Status: Abnormal   Collection Time: 09/04/16  9:01 PM  Result Value Ref Range Status   Specimen Description BLOOD RIGHT FOREARM  Final  Special Requests BOTTLES DRAWN AEROBIC AND ANAEROBIC  BCHV  Final   Culture  Setup Time   Final    GRAM NEGATIVE RODS IN BOTH AEROBIC AND ANAEROBIC BOTTLES CRITICAL RESULT CALLED TO, READ BACK BY AND VERIFIED WITH: HANK ZOMPA 09/05/16 1128 SGD    Culture (A)  Final    ESCHERICHIA COLI SUSCEPTIBILITIES PERFORMED ON PREVIOUS CULTURE WITHIN THE LAST 5 DAYS. Performed at [facility], [place] [address] [place], [place] [address]    Report Status 09/07/2016 FINAL  Final  Blood culture (routine x 2)     Status: Abnormal   Collection Time: 09/04/16  9:01 PM  Result Value Ref  Range Status   Specimen Description BLOOD RIGHT ANTECUBITAL  Final   Special Requests BOTTLES DRAWN AEROBIC AND ANAEROBIC  BCHV  Final   Culture  Setup Time   Final    GRAM NEGATIVE RODS IN BOTH AEROBIC AND ANAEROBIC BOTTLES CRITICAL RESULT CALLED TO, READ BACK BY AND VERIFIED WITH: HANK ZOMPA 09/05/16 1128 SGD Performed at [facility], [place] [address] [place], [place] [address]    Culture ESCHERICHIA COLI (A)  Final   Report Status 09/07/2016 FINAL  Final   Organism ID, Bacteria ESCHERICHIA COLI  Final      Susceptibility   Escherichia coli - MIC*    AMPICILLIN <=2 SENSITIVE Sensitive     CEFAZOLIN <=4 SENSITIVE Sensitive     CEFEPIME <=1 SENSITIVE Sensitive     CEFTAZIDIME <=1 SENSITIVE Sensitive     CEFTRIAXONE <=1 SENSITIVE Sensitive     CIPROFLOXACIN <=0.25 SENSITIVE Sensitive     GENTAMICIN <=1 SENSITIVE Sensitive     IMIPENEM <=0.25 SENSITIVE Sensitive     TRIMETH/SULFA <=20 SENSITIVE Sensitive     AMPICILLIN/SULBACTAM <=2 SENSITIVE Sensitive     PIP/TAZO <=4 SENSITIVE Sensitive     Extended ESBL NEGATIVE Sensitive     * ESCHERICHIA COLI  Blood Culture ID Panel (Reflexed)     Status: Abnormal   Collection Time: 09/04/16  9:01 PM  Result Value Ref Range Status   Enterococcus species NOT DETECTED NOT DETECTED Final   Listeria monocytogenes NOT DETECTED NOT DETECTED Final   Staphylococcus species NOT DETECTED NOT DETECTED Final   Staphylococcus aureus NOT DETECTED NOT DETECTED Final   Streptococcus species NOT DETECTED NOT DETECTED Final   Streptococcus agalactiae NOT DETECTED NOT DETECTED Final   Streptococcus pneumoniae NOT DETECTED NOT DETECTED Final   Streptococcus pyogenes NOT DETECTED NOT DETECTED Final   Acinetobacter baumannii NOT DETECTED NOT DETECTED Final   Enterobacteriaceae species DETECTED (A) NOT DETECTED Final    Comment: Enterobacteriaceae represent a large family of gram-negative bacteria, not a single organism. CRITICAL RESULT CALLED TO, READ BACK  BY AND VERIFIED WITH: HANK ZOMPA 09/05/16 1128 SGD    Enterobacter cloacae complex NOT DETECTED NOT DETECTED Final   Escherichia coli DETECTED (A) NOT DETECTED Final    Comment: CRITICAL RESULT CALLED TO, READ BACK BY AND VERIFIED WITH: HANK ZOMPA 09/05/16 1128 SGD    Klebsiella oxytoca NOT DETECTED NOT DETECTED Final   Klebsiella pneumoniae NOT DETECTED NOT DETECTED Final   Proteus species NOT DETECTED NOT DETECTED Final   Serratia marcescens NOT DETECTED NOT DETECTED Final   Carbapenem resistance NOT DETECTED NOT DETECTED Final   Haemophilus influenzae NOT DETECTED NOT DETECTED Final   Neisseria meningitidis NOT DETECTED NOT DETECTED Final   Pseudomonas aeruginosa NOT DETECTED NOT DETECTED Final   Candida albicans NOT DETECTED NOT DETECTED Final  Candida glabrata NOT DETECTED NOT DETECTED Final   Candida krusei NOT DETECTED NOT DETECTED Final   Candida parapsilosis NOT DETECTED NOT DETECTED Final   Candida tropicalis NOT DETECTED NOT DETECTED Final  MRSA PCR Screening     Status: None   Collection Time: 09/04/16 11:14 PM  Result Value Ref Range Status   MRSA by PCR NEGATIVE NEGATIVE Final    Comment:        The GeneXpert MRSA Assay (FDA approved for NASAL specimens only), is one component of a comprehensive MRSA colonization surveillance program. It is not intended to diagnose MRSA infection nor to guide or monitor treatment for MRSA infections.     Coagulation Studies: No results for input(s): LABPROT, INR in the last 72 hours.  Urinalysis: No results for input(s): COLORURINE, LABSPEC, PHURINE, GLUCOSEU, HGBUR, BILIRUBINUR, KETONESUR, PROTEINUR, UROBILINOGEN, NITRITE, LEUKOCYTESUR in the last 72 hours.  Invalid input(s): APPERANCEUR    Imaging: Dg Chest 1 View  Result Date: 09/13/2016 CLINICAL DATA:  Pneumonia. EXAM: CHEST 1 VIEW COMPARISON:  09/04/2016 FINDINGS: Continued consolidation at the left lung base, improved since prior study. Possible small left  effusion. Right lung is clear. Mild cardiomegaly. Prior median sternotomy. IMPRESSION: Persistent but improving left basilar infiltrate. Small left effusion. Electronically Signed   By: Rolm Baptise M.D.   On: 09/13/2016 09:31     Medications:    . allopurinol  100 mg Oral Daily  . aspirin EC  81 mg Oral Daily  . atorvastatin  40 mg Oral q1800  . ciprofloxacin  500 mg Oral Q24H  . epoetin (EPOGEN/PROCRIT) injection  4,000 Units Intravenous Q T,Th,Sa-HD  . heparin  5,000 Units Subcutaneous Q8H  . indomethacin  100 mg Rectal Once  . indomethacin  100 mg Rectal Once  . insulin aspart  0-5 Units Subcutaneous QHS  . insulin aspart  0-9 Units Subcutaneous TID WC  . levETIRAcetam  1,000 mg Oral Daily  . levETIRAcetam  250 mg Oral Q T,Th,Sa-HD  . pantoprazole  40 mg Oral Daily   morphine injection, ondansetron **OR** ondansetron (ZOFRAN) IV, oxyCODONE  Assessment/ Plan:  Mr. Colin Ellers is a 66 y.o. 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   TTS [facility]  1. End Stage Renal Disease with hyperkalemia: TTS schedule. Patient seen during dialysis Tolerating well   2.  Secondary Hyperparathyroidism: with hyperphosphatemia.  - Not currently on binders.   3. Anemia of chronic kidney disease:  hemoglobin 11.3 Low dose EPO  4. Common bile duct stone with Escherichia coli bacteremia. ERCP with stent placement done on 3/27 Postoperative complications of pancreatitis Cholecystectomy pending      LOS: 10 Kalani Sthilaire 3/31/201811:51 AM

## 2016-09-14 NOTE — Progress Notes (Signed)
Pre hd info 

## 2016-09-14 NOTE — Progress Notes (Signed)
Pre hd assessment  

## 2016-09-14 NOTE — Progress Notes (Signed)
This note also relates to the following rows which could not be included: Pulse Rate - Cannot attach notes to unvalidated device data Resp - Cannot attach notes to unvalidated device data BP - Cannot attach notes to unvalidated device data SpO2 - Cannot attach notes to unvalidated device data  End of hd

## 2016-09-15 ENCOUNTER — Emergency Department: Payer: Medicare Other

## 2016-09-15 ENCOUNTER — Inpatient Hospital Stay
Admission: EM | Admit: 2016-09-15 | Discharge: 2016-09-18 | DRG: 444 | Disposition: A | Discharge status: Home Health | Payer: Medicare Other | Attending: Internal Medicine | Admitting: Internal Medicine

## 2016-09-15 ENCOUNTER — Encounter: Payer: Self-pay | Admitting: Emergency Medicine

## 2016-09-15 DIAGNOSIS — Z4659 Encounter for fitting and adjustment of other gastrointestinal appliance and device: Secondary | ICD-10-CM

## 2016-09-15 DIAGNOSIS — K8050 Calculus of bile duct without cholangitis or cholecystitis without obstruction: Secondary | ICD-10-CM

## 2016-09-15 DIAGNOSIS — Z833 Family history of diabetes mellitus: Secondary | ICD-10-CM

## 2016-09-15 DIAGNOSIS — K8030 Calculus of bile duct with cholangitis, unspecified, without obstruction: Principal | ICD-10-CM | POA: Diagnosis present

## 2016-09-15 DIAGNOSIS — Z940 Kidney transplant status: Secondary | ICD-10-CM

## 2016-09-15 DIAGNOSIS — Z87891 Personal history of nicotine dependence: Secondary | ICD-10-CM

## 2016-09-15 DIAGNOSIS — E1122 Type 2 diabetes mellitus with diabetic chronic kidney disease: Secondary | ICD-10-CM | POA: Diagnosis present

## 2016-09-15 DIAGNOSIS — G40909 Epilepsy, unspecified, not intractable, without status epilepticus: Secondary | ICD-10-CM | POA: Diagnosis present

## 2016-09-15 DIAGNOSIS — Z8249 Family history of ischemic heart disease and other diseases of the circulatory system: Secondary | ICD-10-CM

## 2016-09-15 DIAGNOSIS — D631 Anemia in chronic kidney disease: Secondary | ICD-10-CM | POA: Diagnosis present

## 2016-09-15 DIAGNOSIS — Z823 Family history of stroke: Secondary | ICD-10-CM

## 2016-09-15 DIAGNOSIS — R945 Abnormal results of liver function studies: Secondary | ICD-10-CM

## 2016-09-15 DIAGNOSIS — I252 Old myocardial infarction: Secondary | ICD-10-CM

## 2016-09-15 DIAGNOSIS — Z955 Presence of coronary angioplasty implant and graft: Secondary | ICD-10-CM

## 2016-09-15 DIAGNOSIS — Z8701 Personal history of pneumonia (recurrent): Secondary | ICD-10-CM

## 2016-09-15 DIAGNOSIS — E785 Hyperlipidemia, unspecified: Secondary | ICD-10-CM | POA: Diagnosis present

## 2016-09-15 DIAGNOSIS — N186 End stage renal disease: Secondary | ICD-10-CM | POA: Diagnosis present

## 2016-09-15 DIAGNOSIS — K859 Acute pancreatitis without necrosis or infection, unspecified: Secondary | ICD-10-CM | POA: Diagnosis present

## 2016-09-15 DIAGNOSIS — M549 Dorsalgia, unspecified: Secondary | ICD-10-CM | POA: Diagnosis not present

## 2016-09-15 DIAGNOSIS — Z992 Dependence on renal dialysis: Secondary | ICD-10-CM

## 2016-09-15 DIAGNOSIS — Z951 Presence of aortocoronary bypass graft: Secondary | ICD-10-CM

## 2016-09-15 DIAGNOSIS — N2581 Secondary hyperparathyroidism of renal origin: Secondary | ICD-10-CM | POA: Diagnosis present

## 2016-09-15 DIAGNOSIS — E7800 Pure hypercholesterolemia, unspecified: Secondary | ICD-10-CM | POA: Diagnosis present

## 2016-09-15 DIAGNOSIS — I5032 Chronic diastolic (congestive) heart failure: Secondary | ICD-10-CM | POA: Diagnosis present

## 2016-09-15 DIAGNOSIS — R7989 Other specified abnormal findings of blood chemistry: Secondary | ICD-10-CM

## 2016-09-15 DIAGNOSIS — I2510 Atherosclerotic heart disease of native coronary artery without angina pectoris: Secondary | ICD-10-CM | POA: Diagnosis present

## 2016-09-15 DIAGNOSIS — K219 Gastro-esophageal reflux disease without esophagitis: Secondary | ICD-10-CM | POA: Diagnosis present

## 2016-09-15 DIAGNOSIS — I132 Hypertensive heart and chronic kidney disease with heart failure and with stage 5 chronic kidney disease, or end stage renal disease: Secondary | ICD-10-CM | POA: Diagnosis present

## 2016-09-15 LAB — COMPREHENSIVE METABOLIC PANEL
ALT: 119 U/L — ABNORMAL HIGH (ref 17–63)
ANION GAP: 13 (ref 5–15)
AST: 385 U/L — ABNORMAL HIGH (ref 15–41)
Albumin: 3.1 g/dL — ABNORMAL LOW (ref 3.5–5.0)
Alkaline Phosphatase: 409 U/L — ABNORMAL HIGH (ref 38–126)
BILIRUBIN TOTAL: 2.3 mg/dL — AB (ref 0.3–1.2)
BUN: 33 mg/dL — ABNORMAL HIGH (ref 6–20)
CHLORIDE: 94 mmol/L — AB (ref 101–111)
CO2: 28 mmol/L (ref 22–32)
Calcium: 7.8 mg/dL — ABNORMAL LOW (ref 8.9–10.3)
Creatinine, Ser: 9.34 mg/dL — ABNORMAL HIGH (ref 0.61–1.24)
GFR, EST AFRICAN AMERICAN: 6 mL/min — AB (ref >60)
GFR, EST NON AFRICAN AMERICAN: 5 mL/min — AB (ref >60)
Glucose, Bld: 211 mg/dL — ABNORMAL HIGH (ref 65–99)
POTASSIUM: 3.9 mmol/L (ref 3.5–5.1)
Sodium: 135 mmol/L (ref 135–145)
TOTAL PROTEIN: 7.9 g/dL (ref 6.5–8.1)

## 2016-09-15 LAB — CBC
HEMATOCRIT: 33.2 % — AB (ref 40.0–52.0)
Hemoglobin: 10.3 g/dL — ABNORMAL LOW (ref 13.0–18.0)
MCH: 23.4 pg — ABNORMAL LOW (ref 26.0–34.0)
MCHC: 31.1 g/dL — AB (ref 32.0–36.0)
MCV: 75.3 fL — AB (ref 80.0–100.0)
PLATELETS: 276 10*3/uL (ref 150–440)
RBC: 4.41 MIL/uL (ref 4.40–5.90)
RDW: 18.8 % — AB (ref 11.5–14.5)
WBC: 8.6 10*3/uL (ref 3.8–10.6)

## 2016-09-15 LAB — LIPASE, BLOOD: LIPASE: 274 U/L — AB (ref 11–51)

## 2016-09-15 LAB — TROPONIN I: TROPONIN I: 0.05 ng/mL — AB (ref <0.03)

## 2016-09-15 NOTE — ED Notes (Signed)
0.05 Troponin result told to [facility] MD.

## 2016-09-15 NOTE — ED Provider Notes (Signed)
[facility] Emergency Department Provider Note   ____________________________________________   I have reviewed the triage vital signs and the nursing notes.   HISTORY  Chief Complaint Back Pain   History limited by: Language barrier - family and patient declined interpretter   HPI [name] is a 66 y.o. male who presents to the emergency department today is of concerns for back pain. Is located in the middle of his back. He states it started roughly 6-1/2 hours ago. He tried taking some Tylenol which did not help. It does not come to his stomach up into his chest. The patient has had some vomiting today. Of note the patient was discharged from the hospital yesterday after a prolonged stay for pneumonia as well as choledocholithiasis cholangitis requiring ERCP and stent placement. No fevers.  Past Medical History:  Diagnosis Date  . Chronic diastolic congestive heart failure ([place])   . Chronic disease anemia   . ESRD (end stage renal disease) on dialysis ([place])    "Davita; [place]; [name]" (09/29/2014)  . GERD (gastroesophageal reflux disease)   . Gout   . High cholesterol   . History of blood transfusion    "related to anemia"  . History of stomach ulcers   . Hypertension   . Type II diabetes mellitus [facility])     Patient Active Problem List   Diagnosis Date Noted  . Calculus of bile duct without cholecystitis with obstruction   . Choledocholithiasis   . Diabetes ([place]) 09/04/2016  . HTN (hypertension) 09/04/2016  . GERD (gastroesophageal reflux disease) 09/04/2016  . Chronic diastolic CHF (congestive heart failure) ([place]) 09/04/2016  . Transaminitis 09/04/2016  . Sepsis ([place]) 09/04/2016  . CAP (community acquired pneumonia) 09/04/2016  . Encephalopathy acute   . HCAP (healthcare-associated pneumonia) 07/24/2016  . NSTEMI (non-ST elevated myocardial infarction) ([place]) 02/26/2016  . Acute respiratory failure with hypoxia ([place]) 02/14/2015  . Syncope  11/05/2014  . Contaminant given to patient   . Shortness of breath   . Viridans streptococci infection   . Polymicrobial bacterial infection   . Bacteremia   . 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 ESRD on dialysis ([place])   . 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 Acute coronary syndrome ([place]) 09/29/2014    Past Surgical History:  Procedure Laterality Date  . ARTERIOVENOUS GRAFT PLACEMENT Left ~ 1996  . CARDIAC CATHETERIZATION  09/29/2014   "[place]"  . CARDIAC CATHETERIZATION N/A 02/26/2016   Procedure: Left Heart Cath and Coronary Angiography Possible PCI;  Surgeon: [name], MD;  Location: [place] CV LAB;  Service: Cardiovascular;  Laterality: N/A;  . ERCP N/A 09/10/2016   Procedure: ENDOSCOPIC RETROGRADE CHOLANGIOPANCREATOGRAPHY (ERCP);  Surgeon: [name], MD;  Location: [facility] ENDOSCOPY;  Service: Endoscopy;  Laterality: N/A;  . KIDNEY TRANSPLANT Right 2004  . NEPHRECTOMY TRANSPLANTED ORGAN  2015  . PERCUTANEOUS CORONARY STENT INTERVENTION (PCI-S) N/A 09/30/2014   Procedure: PERCUTANEOUS CORONARY STENT INTERVENTION (PCI-S);  Surgeon: [name], MD;  Location: [facility] CATH LAB;  Service: Cardiovascular;  Laterality: N/A;  . PERITONEAL CATHETER INSERTION  02/2014  . PERITONEAL CATHETER REMOVAL  08/2014  . THROMBECTOMY / ARTERIOVENOUS GRAFT REVISION  2015    Prior to Admission medications   Medication Sig Start Date End Date Taking? Authorizing Provider  acetaminophen (TYLENOL) 325 MG tablet Take 2 tablets (650 mg total) by mouth every 6 (six) hours as needed for mild pain (or Fever >/= 101). 02/27/16  [name], MD  albuterol (PROVENTIL) (2.5 MG/3ML) 0.083% nebulizer solution Take 2.5 mg by nebulization every 6 (six) hours as needed for wheezing or shortness of breath.    Historical Provider, MD  allopurinol (ZYLOPRIM) 100 MG tablet Take 100 mg by mouth daily. 09/23/14   Historical Provider, MD   aspirin 81 MG EC tablet Take 81 mg by mouth daily. 09/23/14   Historical Provider, MD  atorvastatin (LIPITOR) 40 MG tablet Take 2 tablets (80 mg total) by mouth daily at 6 PM. 09/14/16   Bettey Costa, MD  B Complex-C (B-COMPLEX WITH VITAMIN C) tablet Take 1 tablet by mouth daily.    Historical Provider, MD  ciprofloxacin (CIPRO) 500 MG tablet Take 1 tablet (500 mg total) by mouth daily. 09/14/16 09/21/16  Bettey Costa, MD  clopidogrel (PLAVIX) 75 MG tablet Take 1 tablet (75 mg total) by mouth daily with breakfast. 10/02/14   [name], MD  epoetin alfa (EPOGEN,PROCRIT) 4000 UNIT/ML injection Inject 4,000 Units into the vein.    Historical Provider, MD  heparin 100-0.45 UNIT/ML-% infusion Inject 1,000 Units/hr into the vein continuous. 02/27/16   [name], MD  levETIRAcetam (KEPPRA) 1000 MG tablet Take 1 tablet (1,000 mg total) by mouth daily. 07/29/16   Henreitta Leber, MD  levETIRAcetam (KEPPRA) 250 MG tablet Take on the Days on Dialysis (Tues, Thurs, Sat) after Dialysis. 07/29/16   Henreitta Leber, MD  meclizine (ANTIVERT) 12.5 MG tablet Take 12.5 mg by mouth 3 (three) times daily as needed. 04/22/16   Historical Provider, MD  Melatonin 3 MG TABS Take 3 mg by mouth at bedtime.    Historical Provider, MD  mirtazapine (REMERON) 7.5 MG tablet Take 7.5 mg by mouth at bedtime.    Historical Provider, MD  multivitamin (RENA-VIT) TABS tablet Take 1 tablet by mouth at bedtime. Patient not taking: Reported on 09/04/2016 10/02/14   [name], MD  nitroGLYCERIN (NITROSTAT) 0.4 MG SL tablet Place 1 tablet (0.4 mg total) under the tongue every 5 (five) minutes x 3 doses as needed for chest pain. 10/02/14   [name], MD  pantoprazole (PROTONIX) 40 MG tablet Take 40 mg by mouth daily. 01/17/14   Historical Provider, MD    Allergies Ivp dye [iodinated diagnostic agents]  Family History  Problem Relation Age of Onset  . Hypertension Mother   . Stroke Mother   . Hypertension Father   . Diabetes  Mellitus II Father   . Asthma Father     Social History Social History  Substance Use Topics  . Smoking status: Former Smoker    Types: Cigarettes  . Smokeless tobacco: Never Used     Comment: "quit smoking cigarettes in the 1980's"  . Alcohol use No    Review of Systems Constitutional: Negative for fever. Cardiovascular: Negative for chest pain. Respiratory: Negative for shortness of breath. Gastrointestinal: Negative for abdominal pain. Positive for vomiting. Genitourinary: Negative for dysuria. Musculoskeletal: Positive for back pain. Skin: Negative for rash. Neurological: Negative for headaches, focal weakness or numbness.  10-point ROS otherwise negative.  ____________________________________________   PHYSICAL EXAM:  VITAL SIGNS: ED Triage Vitals  Enc Vitals Group     BP 09/15/16 2135 (!) 168/92     Pulse Rate 09/15/16 2135 86     Resp 09/15/16 2135 19     Temp 09/15/16 2135 97.4 F (36.3 C)     Temp Source 09/15/16 2135 Oral     SpO2 09/15/16 2135 97 %     Weight 09/15/16  2136 152 lb (68.9 kg)     Height 09/15/16 2136 4\' 9"  (1.448 m)     Head Circumference --      Peak Flow --      Pain Score 09/15/16 2134 6    Constitutional: Alert and oriented. Well appearing and in no distress. Eyes: Conjunctivae are normal. Normal extraocular movements. ENT   Head: Normocephalic and atraumatic.   Nose: No congestion/rhinnorhea.   Mouth/Throat: Mucous membranes are moist.   Neck: No stridor. Hematological/Lymphatic/Immunilogical: No cervical lymphadenopathy. Cardiovascular: Normal rate, regular rhythm.  No murmurs, rubs, or gallops. Respiratory: Normal respiratory effort without tachypnea nor retractions. Breath sounds are clear and equal bilaterally. No wheezes/rales/rhonchi. Gastrointestinal: Soft and non tender. No tenderness in epigastric or RUQ to deep palpation. No rebound. No guarding.  Genitourinary: Deferred Musculoskeletal: No spinal  tenderness. Neurologic:  Normal speech and language. No gross focal neurologic deficits are appreciated.  Skin:  Skin is warm, dry and intact. No rash noted. Psychiatric: Mood and affect are normal. Speech and behavior are normal. Patient exhibits appropriate insight and judgment.  ____________________________________________    LABS (pertinent positives/negatives)  Labs Reviewed  CBC - Abnormal; Notable for the following:       Result Value   Hemoglobin 10.3 (*)    HCT 33.2 (*)    MCV 75.3 (*)    MCH 23.4 (*)    MCHC 31.1 (*)    RDW 18.8 (*)    All other components within normal limits  COMPREHENSIVE METABOLIC PANEL - Abnormal; Notable for the following:    Chloride 94 (*)    Glucose, Bld 211 (*)    BUN 33 (*)    Creatinine, Ser 9.34 (*)    Calcium 7.8 (*)    Albumin 3.1 (*)    AST 385 (*)    ALT 119 (*)    Alkaline Phosphatase 409 (*)    Total Bilirubin 2.3 (*)    GFR calc non Af Amer 5 (*)    GFR calc Af Amer 6 (*)    All other components within normal limits  TROPONIN I - Abnormal; Notable for the following:    Troponin I 0.05 (*)    All other components within normal limits  LIPASE, BLOOD - Abnormal; Notable for the following:    Lipase 274 (*)    All other components within normal limits     ____________________________________________   EKG  I, Nance Pear, attending physician, personally viewed and interpreted this EKG  EKG Time: 0121 Rate: 75 Rhythm: normal sinus rhythm Axis: left axis deviation Intervals: qtc 536 QRS: narrow ST changes: no st elevation Impression: abnormal ekg   ____________________________________________    RADIOLOGY  RUQ US IMPRESSION: 1. Visible CBD stent. Pneumobilia noted. 2. Generous gallbladder size without mural thickening or pericholecystic fluid. The patient was not tender to probe pressure over the gallbladder.  CXR IMPRESSION: Unchanged left base consolidation, consistent with pneumonia.  No effusion is evident.  ____________________________________________   PROCEDURES  Procedures  ____________________________________________   INITIAL IMPRESSION / ASSESSMENT AND PLAN / ED COURSE  Pertinent labs & imaging results that were available during my care of the patient were reviewed by me and considered in my medical decision making (see chart for details).  Patient presented to the emergency department today because of concerns for back pain. Patient was discharged from the hospital yesterday after pneumonia and cholangitis, ERCP with stent placement. Lab stated do show an elevation of lipase and LFTs. They had essentially returned  to normal levels at time of discharge. Ultrasound was performed which showed stent in place. Given our concerns for obstruction and he for ERCP or MRCP patient will be admitted to the hospitalist service. Troponin was minimally elevated and I think this is likely secondary to dialysis and not true ACS. ____________________________________________   FINAL CLINICAL IMPRESSION(S) / ED DIAGNOSES  Final diagnoses:  Back pain  Elevated LFTs     Note: This dictation was prepared with Dragon dictation. Any transcriptional errors that result from this process are unintentional     Nance Pear, MD 09/16/16 0225

## 2016-09-15 NOTE — ED Triage Notes (Signed)
Pt presents to ED via EMS from home c/o back pain.Pt was s/c'ed yesterday with pneumonia  But has been complaining of back pain all day

## 2016-09-16 ENCOUNTER — Inpatient Hospital Stay: Payer: Medicare Other

## 2016-09-16 DIAGNOSIS — N2581 Secondary hyperparathyroidism of renal origin: Secondary | ICD-10-CM | POA: Diagnosis present

## 2016-09-16 DIAGNOSIS — M549 Dorsalgia, unspecified: Secondary | ICD-10-CM | POA: Diagnosis present

## 2016-09-16 DIAGNOSIS — G40909 Epilepsy, unspecified, not intractable, without status epilepticus: Secondary | ICD-10-CM | POA: Diagnosis present

## 2016-09-16 DIAGNOSIS — Z833 Family history of diabetes mellitus: Secondary | ICD-10-CM | POA: Diagnosis not present

## 2016-09-16 DIAGNOSIS — Z8249 Family history of ischemic heart disease and other diseases of the circulatory system: Secondary | ICD-10-CM | POA: Diagnosis not present

## 2016-09-16 DIAGNOSIS — Z951 Presence of aortocoronary bypass graft: Secondary | ICD-10-CM | POA: Diagnosis not present

## 2016-09-16 DIAGNOSIS — Z823 Family history of stroke: Secondary | ICD-10-CM | POA: Diagnosis not present

## 2016-09-16 DIAGNOSIS — K859 Acute pancreatitis without necrosis or infection, unspecified: Secondary | ICD-10-CM | POA: Diagnosis present

## 2016-09-16 DIAGNOSIS — R945 Abnormal results of liver function studies: Secondary | ICD-10-CM

## 2016-09-16 DIAGNOSIS — E1122 Type 2 diabetes mellitus with diabetic chronic kidney disease: Secondary | ICD-10-CM | POA: Diagnosis present

## 2016-09-16 DIAGNOSIS — Z4659 Encounter for fitting and adjustment of other gastrointestinal appliance and device: Secondary | ICD-10-CM | POA: Diagnosis not present

## 2016-09-16 DIAGNOSIS — R7989 Other specified abnormal findings of blood chemistry: Secondary | ICD-10-CM | POA: Diagnosis not present

## 2016-09-16 DIAGNOSIS — I5032 Chronic diastolic (congestive) heart failure: Secondary | ICD-10-CM | POA: Diagnosis present

## 2016-09-16 DIAGNOSIS — Z8701 Personal history of pneumonia (recurrent): Secondary | ICD-10-CM | POA: Diagnosis not present

## 2016-09-16 DIAGNOSIS — Z955 Presence of coronary angioplasty implant and graft: Secondary | ICD-10-CM | POA: Diagnosis not present

## 2016-09-16 DIAGNOSIS — Z87891 Personal history of nicotine dependence: Secondary | ICD-10-CM | POA: Diagnosis not present

## 2016-09-16 DIAGNOSIS — K219 Gastro-esophageal reflux disease without esophagitis: Secondary | ICD-10-CM | POA: Diagnosis present

## 2016-09-16 DIAGNOSIS — Z940 Kidney transplant status: Secondary | ICD-10-CM | POA: Diagnosis not present

## 2016-09-16 DIAGNOSIS — E785 Hyperlipidemia, unspecified: Secondary | ICD-10-CM | POA: Diagnosis present

## 2016-09-16 DIAGNOSIS — K8030 Calculus of bile duct with cholangitis, unspecified, without obstruction: Secondary | ICD-10-CM | POA: Diagnosis present

## 2016-09-16 DIAGNOSIS — Z992 Dependence on renal dialysis: Secondary | ICD-10-CM | POA: Diagnosis not present

## 2016-09-16 DIAGNOSIS — D631 Anemia in chronic kidney disease: Secondary | ICD-10-CM | POA: Diagnosis present

## 2016-09-16 DIAGNOSIS — E7800 Pure hypercholesterolemia, unspecified: Secondary | ICD-10-CM | POA: Diagnosis present

## 2016-09-16 DIAGNOSIS — I2510 Atherosclerotic heart disease of native coronary artery without angina pectoris: Secondary | ICD-10-CM | POA: Diagnosis present

## 2016-09-16 DIAGNOSIS — N186 End stage renal disease: Secondary | ICD-10-CM | POA: Diagnosis present

## 2016-09-16 DIAGNOSIS — I132 Hypertensive heart and chronic kidney disease with heart failure and with stage 5 chronic kidney disease, or end stage renal disease: Secondary | ICD-10-CM | POA: Diagnosis present

## 2016-09-16 DIAGNOSIS — K8050 Calculus of bile duct without cholangitis or cholecystitis without obstruction: Secondary | ICD-10-CM | POA: Diagnosis not present

## 2016-09-16 DIAGNOSIS — I252 Old myocardial infarction: Secondary | ICD-10-CM | POA: Diagnosis not present

## 2016-09-16 LAB — CBC
HEMATOCRIT: 32.2 % — AB (ref 40.0–52.0)
Hemoglobin: 10.1 g/dL — ABNORMAL LOW (ref 13.0–18.0)
MCH: 23.4 pg — ABNORMAL LOW (ref 26.0–34.0)
MCHC: 31.4 g/dL — AB (ref 32.0–36.0)
MCV: 74.6 fL — AB (ref 80.0–100.0)
PLATELETS: 282 10*3/uL (ref 150–440)
RBC: 4.32 MIL/uL — ABNORMAL LOW (ref 4.40–5.90)
RDW: 19 % — AB (ref 11.5–14.5)
WBC: 8.9 10*3/uL (ref 3.8–10.6)

## 2016-09-16 LAB — COMPREHENSIVE METABOLIC PANEL
ALBUMIN: 2.9 g/dL — AB (ref 3.5–5.0)
ALT: 120 U/L — ABNORMAL HIGH (ref 17–63)
ANION GAP: 16 — AB (ref 5–15)
AST: 364 U/L — ABNORMAL HIGH (ref 15–41)
Alkaline Phosphatase: 390 U/L — ABNORMAL HIGH (ref 38–126)
BILIRUBIN TOTAL: 2.8 mg/dL — AB (ref 0.3–1.2)
BUN: 32 mg/dL — ABNORMAL HIGH (ref 6–20)
CHLORIDE: 95 mmol/L — AB (ref 101–111)
CO2: 28 mmol/L (ref 22–32)
Calcium: 7.7 mg/dL — ABNORMAL LOW (ref 8.9–10.3)
Creatinine, Ser: 9.8 mg/dL — ABNORMAL HIGH (ref 0.61–1.24)
GFR calc Af Amer: 6 mL/min — ABNORMAL LOW (ref >60)
GFR, EST NON AFRICAN AMERICAN: 5 mL/min — AB (ref >60)
Glucose, Bld: 75 mg/dL (ref 65–99)
POTASSIUM: 3.5 mmol/L (ref 3.5–5.1)
Sodium: 139 mmol/L (ref 135–145)
TOTAL PROTEIN: 7.7 g/dL (ref 6.5–8.1)

## 2016-09-16 LAB — TROPONIN I
TROPONIN I: 0.05 ng/mL — AB (ref <0.03)
TROPONIN I: 0.06 ng/mL — AB (ref <0.03)

## 2016-09-16 LAB — MAGNESIUM: Magnesium: 2.2 mg/dL (ref 1.7–2.4)

## 2016-09-16 LAB — GLUCOSE, CAPILLARY
GLUCOSE-CAPILLARY: 144 mg/dL — AB (ref 65–99)
GLUCOSE-CAPILLARY: 154 mg/dL — AB (ref 65–99)
GLUCOSE-CAPILLARY: 134 mg/dL — AB (ref 65–99)
Glucose-Capillary: 74 mg/dL (ref 65–99)
Glucose-Capillary: 135 mg/dL — ABNORMAL HIGH (ref 65–99)
Glucose-Capillary: 84 mg/dL (ref 65–99)

## 2016-09-16 LAB — PHOSPHORUS: PHOSPHORUS: 4.8 mg/dL — AB (ref 2.5–4.6)

## 2016-09-16 MED ORDER — ONDANSETRON HCL 4 MG/2ML IJ SOLN: 4.0000 mg | Freq: Four times a day (QID) | INTRAMUSCULAR | Status: DC | PRN

## 2016-09-16 MED ORDER — ASPIRIN EC 81 MG PO TBEC
81.0000 mg | DELAYED_RELEASE_TABLET | Freq: Every day | ORAL | Status: DC
  Administered 2016-09-16 – 2016-09-18 (×2): 81 mg via ORAL
  Filled 2016-09-16 (×2): qty 1

## 2016-09-16 MED ORDER — SENNOSIDES-DOCUSATE SODIUM 8.6-50 MG PO TABS: 1.0000 | ORAL_TABLET | Freq: Every evening | ORAL | Status: DC | PRN

## 2016-09-16 MED ORDER — ACETAMINOPHEN 650 MG RE SUPP: 650.0000 mg | Freq: Four times a day (QID) | RECTAL | Status: DC | PRN

## 2016-09-16 MED ORDER — LEVETIRACETAM 250 MG PO TABS
250.0000 mg | ORAL_TABLET | ORAL | Status: DC
  Filled 2016-09-16: qty 1

## 2016-09-16 MED ORDER — B COMPLEX-C PO TABS
1.0000 | ORAL_TABLET | Freq: Every day | ORAL | Status: DC
  Administered 2016-09-16 – 2016-09-18 (×2): 1 via ORAL
  Filled 2016-09-16 (×3): qty 1

## 2016-09-16 MED ORDER — MECLIZINE HCL 25 MG PO TABS
12.5000 mg | ORAL_TABLET | Freq: Three times a day (TID) | ORAL | Status: DC | PRN
Start: 2016-09-16 — End: 2016-09-18

## 2016-09-16 MED ORDER — RENA-VITE PO TABS
1.0000 | ORAL_TABLET | Freq: Every day | ORAL | Status: DC
  Administered 2016-09-16 – 2016-09-17 (×2): 1 via ORAL
  Filled 2016-09-16 (×2): qty 1

## 2016-09-16 MED ORDER — ALBUTEROL SULFATE (2.5 MG/3ML) 0.083% IN NEBU: 2.5000 mg | INHALATION_SOLUTION | Freq: Four times a day (QID) | RESPIRATORY_TRACT | Status: DC | PRN

## 2016-09-16 MED ORDER — BISACODYL 5 MG PO TBEC: 5.0000 mg | DELAYED_RELEASE_TABLET | Freq: Every day | ORAL | Status: DC | PRN

## 2016-09-16 MED ORDER — MIRTAZAPINE 15 MG PO TABS
7.5000 mg | ORAL_TABLET | Freq: Every day | ORAL | Status: DC
  Administered 2016-09-16 – 2016-09-17 (×2): 7.5 mg via ORAL
  Filled 2016-09-16 (×2): qty 1

## 2016-09-16 MED ORDER — CIPROFLOXACIN HCL 500 MG PO TABS
500.0000 mg | ORAL_TABLET | ORAL | Status: DC
  Administered 2016-09-16 – 2016-09-18 (×3): 500 mg via ORAL
  Filled 2016-09-16 (×3): qty 1

## 2016-09-16 MED ORDER — PANTOPRAZOLE SODIUM 40 MG PO TBEC
40.0000 mg | DELAYED_RELEASE_TABLET | Freq: Every day | ORAL | Status: DC
  Administered 2016-09-16 – 2016-09-18 (×2): 40 mg via ORAL
  Filled 2016-09-16 (×2): qty 1

## 2016-09-16 MED ORDER — HEPARIN SODIUM (PORCINE) 5000 UNIT/ML IJ SOLN
5000.0000 [IU] | Freq: Three times a day (TID) | INTRAMUSCULAR | Status: DC
  Filled 2016-09-16: qty 1

## 2016-09-16 MED ORDER — MELATONIN 5 MG PO TABS
5.0000 mg | ORAL_TABLET | Freq: Every day | ORAL | Status: DC
  Administered 2016-09-16 – 2016-09-17 (×2): 5 mg via ORAL
  Filled 2016-09-16 (×3): qty 1

## 2016-09-16 MED ORDER — MAGNESIUM CITRATE PO SOLN
1.0000 | Freq: Once | ORAL | Status: DC | PRN
  Filled 2016-09-16: qty 296

## 2016-09-16 MED ORDER — IPRATROPIUM BROMIDE 0.02 % IN SOLN: 0.5000 mg | Freq: Four times a day (QID) | RESPIRATORY_TRACT | Status: DC | PRN

## 2016-09-16 MED ORDER — LEVETIRACETAM 250 MG PO TABS: 250.0000 mg | ORAL_TABLET | ORAL | Status: DC

## 2016-09-16 MED ORDER — ACETAMINOPHEN 325 MG PO TABS: 650.0000 mg | ORAL_TABLET | Freq: Four times a day (QID) | ORAL | Status: DC | PRN

## 2016-09-16 MED ORDER — OXYCODONE HCL 5 MG PO TABS
5.0000 mg | ORAL_TABLET | ORAL | Status: DC | PRN
  Administered 2016-09-16 – 2016-09-17 (×2): 5 mg via ORAL
  Filled 2016-09-16 (×2): qty 1

## 2016-09-16 MED ORDER — INSULIN ASPART 100 UNIT/ML ~~LOC~~ SOLN
0.0000 [IU] | SUBCUTANEOUS | Status: DC
  Administered 2016-09-16: 2 [IU] via SUBCUTANEOUS
  Administered 2016-09-17: 5 [IU] via SUBCUTANEOUS
  Filled 2016-09-16: qty 2
  Filled 2016-09-16: qty 5
  Filled 2016-09-16: qty 2

## 2016-09-16 MED ORDER — LEVETIRACETAM 500 MG PO TABS
1000.0000 mg | ORAL_TABLET | Freq: Every day | ORAL | Status: DC
  Administered 2016-09-16 – 2016-09-18 (×2): 1000 mg via ORAL
  Filled 2016-09-16 (×2): qty 2

## 2016-09-16 MED ORDER — ATORVASTATIN CALCIUM 20 MG PO TABS
80.0000 mg | ORAL_TABLET | Freq: Every day | ORAL | Status: DC
  Administered 2016-09-16: 80 mg via ORAL
  Filled 2016-09-16: qty 4

## 2016-09-16 MED ORDER — ONDANSETRON HCL 4 MG PO TABS: 4.0000 mg | ORAL_TABLET | Freq: Four times a day (QID) | ORAL | Status: DC | PRN

## 2016-09-16 MED ORDER — ALLOPURINOL 100 MG PO TABS
100.0000 mg | ORAL_TABLET | Freq: Every day | ORAL | Status: DC
  Administered 2016-09-16 – 2016-09-18 (×2): 100 mg via ORAL
  Filled 2016-09-16 (×2): qty 1

## 2016-09-16 MED ORDER — NITROGLYCERIN 0.4 MG SL SUBL: 0.4000 mg | SUBLINGUAL_TABLET | SUBLINGUAL | Status: DC | PRN

## 2016-09-16 MED ORDER — CLOPIDOGREL BISULFATE 75 MG PO TABS: 75.0000 mg | ORAL_TABLET | Freq: Every day | ORAL | Status: DC

## 2016-09-16 NOTE — ED Notes (Signed)
IV attempt unsuccessful by Ena Dawley, RN; admitting MD aware as well as receiving RN

## 2016-09-16 NOTE — Consult Note (Signed)
David Lame, MD [facility]  [address] [place], [place] [address] Phone: [phone] Fax : [phone]  Consultation  Referring Provider:     Dr. Bridgett Larsson Primary Care Physician:  David Axe, MD Primary Gastroenterologist:  Dr. Allen Norris         Reason for Consultation:     Elevated liver enzymes  Date of Admission:  09/15/2016 Date of Consultation:  09/16/2016         HPI:   [name] is a 66 y.o. male who was in the hospital recently with common bile duct stones. The patient had been admitted with a report of being on Plavix. The patient had the Plavix held for a impending ERCP. The morning of the ERCP the patient was given 5000 units of subcutaneous heparin. Due to the heparin being given the patient underwent an ERCP with a stent placement instead of doing a sphincterotomy with stone extraction. The patient had post-ERCP pancreatitis which resolved over the next few days. The patient went home with an outpatient laparoscopic cholecystectomy suggested. The patient had been discharged with his AST ALT and bilirubin back to normal. The patient now comes with signs of biliary obstruction and increased liver enzymes.  Past Medical History:  Diagnosis Date  . Chronic diastolic congestive heart failure ([place])   . Chronic disease anemia   . ESRD (end stage renal disease) on dialysis ([place])    "[place]; [place]; [name]" (09/29/2014)  . GERD (gastroesophageal reflux disease)   . Gout   . High cholesterol   . History of blood transfusion    "related to anemia"  . History of stomach ulcers   . Hypertension   . Type II diabetes mellitus ([place])     Past Surgical History:  Procedure Laterality Date  . ARTERIOVENOUS GRAFT PLACEMENT Left ~ 1996  . CARDIAC CATHETERIZATION  09/29/2014   "[place]"  . CARDIAC CATHETERIZATION N/A 02/26/2016   Procedure: Left Heart Cath and Coronary Angiography Possible PCI;  Surgeon: [name], MD;  Location: [place] CV LAB;  Service: Cardiovascular;   Laterality: N/A;  . ERCP N/A 09/10/2016   Procedure: ENDOSCOPIC RETROGRADE CHOLANGIOPANCREATOGRAPHY (ERCP);  Surgeon: David Lame, MD;  Location: [facility] ENDOSCOPY;  Service: Endoscopy;  Laterality: N/A;  . KIDNEY TRANSPLANT Right 2004  . NEPHRECTOMY TRANSPLANTED ORGAN  2015  . PERCUTANEOUS CORONARY STENT INTERVENTION (PCI-S) N/A 09/30/2014   Procedure: PERCUTANEOUS CORONARY STENT INTERVENTION (PCI-S);  Surgeon: [name], MD;  Location: [facility] CATH LAB;  Service: Cardiovascular;  Laterality: N/A;  . PERITONEAL CATHETER INSERTION  02/2014  . PERITONEAL CATHETER REMOVAL  08/2014  . THROMBECTOMY / ARTERIOVENOUS GRAFT REVISION  2015    Prior to Admission medications   Medication Sig Start Date End Date Taking? Authorizing Provider  acetaminophen (TYLENOL) 325 MG tablet Take 2 tablets (650 mg total) by mouth every 6 (six) hours as needed for mild pain (or Fever >/= 101). 02/27/16  Yes [name], MD  albuterol (PROVENTIL) (2.5 MG/3ML) 0.083% nebulizer solution Take 2.5 mg by nebulization every 6 (six) hours as needed for wheezing or shortness of breath.   Yes Historical Provider, MD  allopurinol (ZYLOPRIM) 100 MG tablet Take 100 mg by mouth daily. 09/23/14  Yes Historical Provider, MD  aspirin 81 MG EC tablet Take 81 mg by mouth daily. 09/23/14  Yes Historical Provider, MD  atorvastatin (LIPITOR) 40 MG tablet Take 2 tablets (80 mg total) by mouth daily at 6 PM. 09/14/16  Yes Bettey Costa, MD  B Complex-C (B-COMPLEX WITH VITAMIN C)  tablet Take 1 tablet by mouth daily.   Yes Historical Provider, MD  ciprofloxacin (CIPRO) 500 MG tablet Take 1 tablet (500 mg total) by mouth daily. 09/14/16 09/21/16 Yes Bettey Costa, MD  clopidogrel (PLAVIX) 75 MG tablet Take 1 tablet (75 mg total) by mouth daily with breakfast. 10/02/14  Yes [name], MD  epoetin alfa (EPOGEN,PROCRIT) 4000 UNIT/ML injection Inject 4,000 Units into the vein.   Yes Historical Provider, MD  heparin 100-0.45 UNIT/ML-% infusion Inject 1,000  Units/hr into the vein continuous. 02/27/16  Yes [name], MD  levETIRAcetam (KEPPRA) 1000 MG tablet Take 1 tablet (1,000 mg total) by mouth daily. 07/29/16  Yes Henreitta Leber, MD  levETIRAcetam (KEPPRA) 250 MG tablet Take on the Days on Dialysis (Tues, Thurs, Sat) after Dialysis. 07/29/16  Yes Henreitta Leber, MD  meclizine (ANTIVERT) 12.5 MG tablet Take 12.5 mg by mouth 3 (three) times daily as needed. 04/22/16  Yes Historical Provider, MD  Melatonin 3 MG TABS Take 3 mg by mouth at bedtime.   Yes Historical Provider, MD  mirtazapine (REMERON) 7.5 MG tablet Take 7.5 mg by mouth at bedtime.   Yes Historical Provider, MD  multivitamin (RENA-VIT) TABS tablet Take 1 tablet by mouth at bedtime. 10/02/14  Yes [name], MD  nitroGLYCERIN (NITROSTAT) 0.4 MG SL tablet Place 1 tablet (0.4 mg total) under the tongue every 5 (five) minutes x 3 doses as needed for chest pain. 10/02/14  Yes [name], MD  pantoprazole (PROTONIX) 40 MG tablet Take 40 mg by mouth daily. 01/17/14  Yes Historical Provider, MD    Family History  Problem Relation Age of Onset  . Hypertension Mother   . Stroke Mother   . Hypertension Father   . Diabetes Mellitus II Father   . Asthma Father      Social History  Substance Use Topics  . Smoking status: Former Smoker    Types: Cigarettes  . Smokeless tobacco: Never Used     Comment: "quit smoking cigarettes in the 1980's"  . Alcohol use No    Allergies as of 09/15/2016 - Review Complete 09/15/2016  Allergen Reaction Noted  . Ivp dye [iodinated diagnostic agents] Other (See Comments) 09/29/2014    Review of Systems:    All systems reviewed and negative except where noted in HPI.   Physical Exam:  Vital signs in last 24 hours: Temp:  [97.4 F (36.3 C)-97.7 F (36.5 C)] 97.6 F (36.4 C) (04/02 1159) Pulse Rate:  [75-92] 79 (04/02 1159) Resp:  [16-23] 16 (04/02 1159) BP: (136-168)/(58-92) 141/58 (04/02 1159) SpO2:  [94 %-100 %] 94 % (04/02  1159) Weight:  [151 lb 6.4 oz (68.7 kg)-152 lb (68.9 kg)] 151 lb 6.4 oz (68.7 kg) (04/02 0322)   General:   Pleasant, cooperative in NAD Head:  Normocephalic and atraumatic. Eyes:   No icterus.   Conjunctiva pink. PERRLA. Ears:  Normal auditory acuity. Neck:  Supple; no masses or thyroidomegaly Lungs: Respirations even and unlabored. Lungs clear to auscultation bilaterally.   No wheezes, crackles, or rhonchi.  Heart:  Regular rate and rhythm;  Without murmur, clicks, rubs or gallops Abdomen:  Soft, nondistended, nontender. Normal bowel sounds. No appreciable masses or hepatomegaly.  No rebound or guarding.  Rectal:  Not performed. Msk:  Symmetrical without gross deformities.    Extremities:  Without edema, cyanosis or clubbing. Neurologic:  Alert and oriented x3;  grossly normal neurologically. Skin:  Intact without significant lesions or rashes. Cervical Nodes:  No significant cervical  adenopathy. Psych:  Alert and cooperative. Normal affect.  LAB RESULTS:  Recent Labs  09/14/16 0336 09/15/16 2250 09/16/16 0342  WBC 15.8* 8.6 8.9  HGB 11.3* 10.3* 10.1*  HCT 36.4* 33.2* 32.2*  PLT 253 276 282   BMET  Recent Labs  09/15/16 2250 09/16/16 0342  NA 135 139  K 3.9 3.5  CL 94* 95*  CO2 28 28  GLUCOSE 211* 75  BUN 33* 32*  CREATININE 9.34* 9.80*  CALCIUM 7.8* 7.7*   LFT  Recent Labs  09/16/16 0342  PROT 7.7  ALBUMIN 2.9*  AST 364*  ALT 120*  ALKPHOS 390*  BILITOT 2.8*   PT/INR No results for input(s): LABPROT, INR in the last 72 hours.  STUDIES: Dg Chest 1 View  Result Date: 09/15/2016 CLINICAL DATA:  Back pain.  Recent pneumonia diagnosis. EXAM: CHEST 1 VIEW COMPARISON:  09/13/2016 FINDINGS: Persistent left base consolidation. Right lung remains clear. Pulmonary vasculature is normal. Unchanged hilar and mediastinal contours. No large effusion. IMPRESSION: Unchanged left base consolidation, consistent with pneumonia. No effusion is evident. Electronically  Signed   By: Andreas Newport M.D.   On: 09/15/2016 22:30   Dg Thoracic Spine 2 View  Result Date: 09/16/2016 CLINICAL DATA:  Back pain since February 2018. 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 Normal alignment. No acute bony findings. No abnormal paraspinal soft tissue swelling. The visualized ribs are intact. IMPRESSION: Normal alignment and no acute bony findings. Electronically Signed   By: Marijo Sanes M.D.   On: 09/16/2016 07:49   US Abdomen Limited Ruq  Result Date: 09/16/2016 CLINICAL DATA:  Back pain and increasing the liver function tests. Recent ERCP with CBD stent placement. EXAM: US ABDOMEN LIMITED - RIGHT UPPER QUADRANT COMPARISON:  MRI 09/06/2016 FINDINGS: Gallbladder: Generous gallbladder distention, over 5 cm in short axis. No gallstones or wall thickening visualized. No sonographic Murphy sign noted by sonographer. Common bile duct: Visible stent within the common bile duct.  The duct measures 10 mm. Liver: No focal liver lesion is evident. There is pneumobilia, likely related to the CBD stent. IMPRESSION: 1. Visible CBD stent.  Pneumobilia noted. 2. Generous gallbladder size without mural thickening or pericholecystic fluid. The patient was not tender to probe pressure over the gallbladder. Electronically Signed   By: Andreas Newport M.D.   On: 09/16/2016 00:43      Impression / Plan:   D[place] Grayer is a 66 y.o. y/o male with With a recent ERCP with stent placement for bile duct stones. The patient now comes in with what appears to be elevated liver enzymes caused by stent blockage and possible cholangitis. The patient's main complaint is abdominal pain and his white cell count is 8.9. The white cell count is down from 17.5 on the 30th. The patient will be set up for an ERCP tomorrow. The patient will have his heparin and anticoagulation medication stopped except for his daily  aspirin. The patient and his wife have been explained the plan and agree with it.  Thank you for involving me in the care of this patient.      LOS: 0 days   David Lame, MD  09/16/2016, 12:29 PM   Note: This dictation was prepared with Dragon dictation along with smaller phrase technology. Any transcriptional errors that result from this process are unintentional.

## 2016-09-16 NOTE — Progress Notes (Signed)
The patient has no complaints this morning. Vital signs stable. Physical examination is unremarkable.  This is a 66 y.o.malewith a history of chronic diastolic congestive heart failure, end-stage renal disease on hemodialysis status post failed kidney transplant, hypertension, hyperlipidemia, anemia of chronic disease, seizure disorder, gout and type 2 diabetes.  #. Transaminitis caused by stent blockage and possible cholangitis.  recent stent placement for choledocholithiasis and cholangitis.  Clear liquids for now, nothing by mouth after midnight for ERCP per Dr. Allen Norris. Hold heparin and Plavix for the procedure.  #. Back pain, possibly secondary to pancreatitis vs. MSK etiology pain control - PT eval suggested home health PT.  #. Recent sepsis, pneumonia and  common bile duct stone with Escherichia coli bacteremia. - Continue Cipro  #. ESRD on HD Continue dialysis -Continue multivitamin  #. History of chronic diastolic heart failure -Continue Coreg, nitroglycerin, losartan  #. History of coronary artery disease status post MI -Continue aspirin, hold Plavix, continue Coreg, nitroglycerin, Lipitor  #. History of GERD -Continue Protonix  #. History of seizures -Continue Keppra  #. Elevated troponins, mild and likely secondary to end-stage renal disease Stable troponins  #. H/o Diabetes Continue sliding scale.  I discussed with Dr. Allen Norris and Dr. Holley Raring.

## 2016-09-16 NOTE — ED Notes (Signed)
Admitting MD at bedside.

## 2016-09-16 NOTE — H&P (Signed)
THIS IS A DUPLICATE NOTE WITH THE CORRECT DATE OF SERVICE.  THIS PATIENT'S ADMISSION WAS DONE BY ME ON 09/16/16  [] Hide copied text History and Physical   [facility] - [place] @ [facility] Admission History and Physical [employer], D.O.    Patient Name: [name] MR#: [id number] Date of Birth: [birth year] Date of Admission: 09/15/2016  Referring MD/NP/PA: Dr. Archie Balboa Primary Care Physician: [name], MD Patient coming from: Home Outpatient Specialists: Dr. [name]   Chief Complaint:     Chief Complaint  Patient presents with  . Back Pain    HPI: LoyKhabanhis a 66 y.o.malewith a known history of chronic diastolic congestive heart failure, end-stage renal disease on hemodialysis status post failed kidney transplant, hypertension, hyperlipidemia, anemia of chronic disease, seizure disorder, gout and type 2 diabeteswas in a usual state of health until 6 hours prior to arrival when he developed mid back pain which he has had before starting in February 2018.   His pain is associated with vomiting today. He denies any trauma, new exercise.  Of note patient was recently discharged from the hospital yesterday where he was treated for pneumonia, but also had ERCP with stents for choledocholithiasis and cholangitis. His labs had nearly normalized prior to his discharge.  He states he has had similar back pain during his most recent hospitalization.    He has also had a complicated recent course including a, NSTEMI, left heart catheterization, coronary artery bypass grafting and hospitalization at [facility]. He presented to [facility] for chest pain in Sept 2017, was transferred to [facility] where he had bypass surgery on 02/28/2016. His postop course was complicated by seizures. He was transferred to rehabilitation on 03/21/2016. He was seen again in our emergency department in November for syncope and admitted to [facility] again in early January for bradycardia. His metoprolol was  discontinued on discharge.  Otherwise there has been no change in status. Patient has been taking medication as prescribed and there has been no recent change in medication or diet.  No recent antibiotics.  There has been no recent travel or sick contacts.    Review of Systems:  CONSTITUTIONAL: Nofever/chills, fatigue, weakness, weight gain/loss, headache. EYES: No blurry or double vision. ENT: No tinnitus, postnasal drip, redness or soreness of the oropharynx. RESPIRATORY: No cough, dyspnea, wheeze.  No hemoptysis.  CARDIOVASCULAR: No chest pain, palpitations, syncope, orthopnea. No lower extremity edema.  GASTROINTESTINAL: Positive nausea, vomiting, Negative abdominal pain, diarrhea, constipation.  No hematemesis, melena or hematochezia. GENITOURINARY: No dysuria, frequency, hematuria. ENDOCRINE: No polyuria or nocturia. No heat or cold intolerance. HEMATOLOGY: No anemia, bruising, bleeding. INTEGUMENTARY: No rashes, ulcers, lesions. MUSCULOSKELETAL: No arthritis, gout, dyspnea. Positive mid thoracic back pain.  NEUROLOGIC: No numbness, tingling, ataxia, seizure-type activity, weakness. PSYCHIATRIC: No anxiety, depression, insomnia.       Past Medical History:  Diagnosis Date  . Chronic diastolic congestive heart failure ([name])   . Chronic disease anemia   . ESRD (end stage renal disease) on dialysis ([name])    "[place] [place]; [name]" (09/29/2014)  . GERD (gastroesophageal reflux disease)   . Gout   . High cholesterol   . History of blood transfusion    "related to anemia"  . History of stomach ulcers   . Hypertension   . Type II diabetes mellitus ([place])          Past Surgical History:  Procedure Laterality Date  . ARTERIOVENOUS GRAFT PLACEMENT Left ~ 1996  . CARDIAC CATHETERIZATION  09/29/2014   "[place]"  .  CARDIAC CATHETERIZATION N/A 02/26/2016   Procedure: Left Heart Cath and Coronary Angiography Possible PCI;  Surgeon: [name], MD;   Location: [facility];  Service: Cardiovascular;  Laterality: N/A;  . ERCP N/A 09/10/2016   Procedure: ENDOSCOPIC RETROGRADE CHOLANGIOPANCREATOGRAPHY (ERCP);  Surgeon: [name], MD;  Location: [facility] ENDOSCOPY;  Service: Endoscopy;  Laterality: N/A;  . KIDNEY TRANSPLANT Right 2004  . NEPHRECTOMY TRANSPLANTED ORGAN  2015  . PERCUTANEOUS CORONARY STENT INTERVENTION (PCI-S) N/A 09/30/2014   Procedure: PERCUTANEOUS CORONARY STENT INTERVENTION (PCI-S);  Surgeon: [name], MD;  Location: [facility] CATH LAB;  Service: Cardiovascular;  Laterality: N/A;  . PERITONEAL CATHETER INSERTION  02/2014  . PERITONEAL CATHETER REMOVAL  08/2014  . THROMBECTOMY / ARTERIOVENOUS GRAFT REVISION  2015     reports that he has quit smoking. His smoking use included Cigarettes. He has never used smokeless tobacco. He reports that he does not drink alcohol or use drugs.       Allergies  Allergen Reactions  . Ivp Dye [Iodinated Diagnostic Agents] Other (See Comments)    Pt denied         Family History  Problem Relation Age of Onset  . Hypertension Mother   . Stroke Mother   . Hypertension Father   . Diabetes Mellitus II Father   . Asthma Father            Prior to Admission medications   Medication Sig Start Date End Date Taking? Authorizing Provider  acetaminophen (TYLENOL) 325 MG tablet Take 2 tablets (650 mg total) by mouth every 6 (six) hours as needed for mild pain (or Fever >/= 101). 02/27/16  Yes [name], MD  albuterol (PROVENTIL) (2.5 MG/3ML) 0.083% nebulizer solution Take 2.5 mg by nebulization every 6 (six) hours as needed for wheezing or shortness of breath.   Yes Historical Provider, MD  allopurinol (ZYLOPRIM) 100 MG tablet Take 100 mg by mouth daily. 09/23/14  Yes Historical Provider, MD  aspirin 81 MG EC tablet Take 81 mg by mouth daily. 09/23/14  Yes Historical Provider, MD  atorvastatin (LIPITOR) 40 MG tablet Take 2 tablets (80 mg total) by mouth daily at 6  PM. 09/14/16  Yes [name], MD  B Complex-C (B-COMPLEX WITH VITAMIN C) tablet Take 1 tablet by mouth daily.   Yes Historical Provider, MD  ciprofloxacin (CIPRO) 500 MG tablet Take 1 tablet (500 mg total) by mouth daily. 09/14/16 09/21/16 Yes Bettey Costa, MD  clopidogrel (PLAVIX) 75 MG tablet Take 1 tablet (75 mg total) by mouth daily with breakfast. 10/02/14  Yes [name], MD  epoetin alfa (EPOGEN,PROCRIT) 4000 UNIT/ML injection Inject 4,000 Units into the vein.   Yes Historical Provider, MD  heparin 100-0.45 UNIT/ML-% infusion Inject 1,000 Units/hr into the vein continuous. 02/27/16  Yes [name], MD  levETIRAcetam (KEPPRA) 1000 MG tablet Take 1 tablet (1,000 mg total) by mouth daily. 07/29/16  Yes Henreitta Leber, MD  levETIRAcetam (KEPPRA) 250 MG tablet Take on the Days on Dialysis (Tues, Thurs, Sat) after Dialysis. 07/29/16  Yes Henreitta Leber, MD  meclizine (ANTIVERT) 12.5 MG tablet Take 12.5 mg by mouth 3 (three) times daily as needed. 04/22/16  Yes Historical Provider, MD  Melatonin 3 MG TABS Take 3 mg by mouth at bedtime.   Yes Historical Provider, MD  mirtazapine (REMERON) 7.5 MG tablet Take 7.5 mg by mouth at bedtime.   Yes Historical Provider, MD  multivitamin (RENA-VIT) TABS tablet Take 1 tablet by mouth at bedtime. 10/02/14  Yes [name], MD  nitroGLYCERIN (NITROSTAT) 0.4 MG SL tablet Place 1 tablet (0.4 mg total) under the tongue every 5 (five) minutes x 3 doses as needed for chest pain. 10/02/14  Yes [name], MD  pantoprazole (PROTONIX) 40 MG tablet Take 40 mg by mouth daily. 01/17/14  Yes Historical Provider, MD    Physical Exam:       Vitals:   09/15/16 2135 09/15/16 2136 09/15/16 2300 09/15/16 2330  BP: (!) 168/92  (!) 164/87 (!) 166/80  Pulse: 86  79 75  Resp: 19  (!) 23 16  Temp: 97.4 F (36.3 C)     TempSrc: Oral     SpO2: 97%  98% 100%  Weight:  68.9 kg (152 lb)    Height:  4\' 9"  (1.448 m)     GENERAL: 66  y.o.-year-old [place] malepatient, well-developed, well-nourished lying in the bed in no acute distress. Pleasant and cooperative.  HEENT: Head atraumatic, normocephalic. Pupils equal, round, reactive to light and accommodation. No scleral icterus. Extraocular muscles intact. Nares are patent. Oropharynx is clear. Mucus membranes moist. NECK: Supple, full range of motion. No JVD, no bruit heard. No thyroid enlargement, no tenderness, no cervical lymphadenopathy. CHEST: Normal breath sounds bilaterally. No wheezing, rales, rhonchi or crackles. No use of accessory muscles of respiration. No reproducible chest wall tenderness.  CARDIOVASCULAR: S1, S2 normal. No murmurs, rubs, or gallops. Cap refill <2 seconds. Pulses intact distally.  ABDOMEN: Soft, nondistended, nontender. No rebound, guarding, rigidity. Normoactive bowel sounds present in all four quadrants. No organomegaly or mass. SPINE: Patient has mild paravertebral muscular spasm over the right mid thoracic region. No midline tenderness, no edema, erythema. EXTREMITIES: No pedal edema, cyanosis, or clubbing. No calf tenderness or Homan's sign.  SPINE: No midline tenderness to palpation.  No significant paravertebral muscle spasm.  NEUROLOGIC: The patient is alert and oriented x 3. Cranial nerves II through XII are grossly intact with no focal sensorimotor deficit. Muscle strength 5/5 in all extremities. Sensation intact. Gait not checked. PSYCHIATRIC: Normal affect, mood, thought content. SKIN: Warm, dry, and intact without obvious rash, lesion, or ulcer.   Labs on Admission:  CBC:  Last Labs    Recent Labs Lab 09/09/16 0401 09/13/16 0624 09/14/16 0336 09/15/16 2250  WBC 6.7 17.5* 15.8* 8.6  HGB 12.2* 10.5* 11.3* 10.3*  HCT 38.4* 33.4* 36.4* 33.2*  MCV 73.9* 73.3* 74.7* 75.3*  PLT 235 229 253 276     Basic Metabolic Panel:  Last Labs    Recent Labs Lab 09/10/16 0601 09/11/16 0417 09/12/16 0435 09/13/16 0624  09/15/16 2250  NA 137 138 138 135 135  K 4.7 4.5 4.5 5.2* 3.9  CL 94* 94* 95* 94* 94*  CO2 28 28 26 26 28   GLUCOSE 106* 124* 102* 106* 211*  BUN 24* 37* 45* 33* 33*  CREATININE 7.78* 10.13* 12.03* 10.20* 9.34*  CALCIUM 8.4* 7.9* 7.0* 7.3* 7.8*     GFR: Estimated Creatinine Clearance: 6 mL/min (A) (by C-G formula based on SCr of 9.34 mg/dL (H)). Liver Function Tests:  Last Labs    Recent Labs Lab 09/09/16 0401 09/10/16 0601 09/11/16 0417 09/12/16 0435 09/15/16 2250  AST 41 43* 42* 32 385*  ALT 86* 68* 57 38 119*  ALKPHOS 241* 209* 203* 165* 409*  BILITOT 1.5* 1.6* 1.5* 1.1 2.3*  PROT 8.1 8.1 8.3* 7.6 7.9  ALBUMIN 3.5 3.6 3.6 3.2* 3.1*      Last Labs    Recent Labs Lab 09/10/16  0601 09/11/16 0417 09/12/16 0435 09/13/16 0624 09/15/16 2250  LIPASE 47 4,082* 1,133* 67* 274*     Last Labs   No results for input(s): AMMONIA in the last 168 hours.   Coagulation Profile: Last Labs   No results for input(s): INR, PROTIME in the last 168 hours.   Cardiac Enzymes:  Last Labs    Recent Labs Lab 09/15/16 2250  TROPONINI 0.05*     BNP (last 3 results) Recent Labs (within last 365 days)  No results for input(s): PROBNP in the last 8760 hours.   HbA1C: Recent Labs (last 2 labs)   No results for input(s): HGBA1C in the last 72 hours.   CBG:  Last Labs    Recent Labs Lab 09/13/16 0735 09/13/16 1157 09/13/16 1649 09/13/16 2148 09/14/16 0753  GLUCAP 110* 110* 117* 103* 107*     Lipid Profile: Recent Labs (last 2 labs)   No results for input(s): CHOL, HDL, LDLCALC, TRIG, CHOLHDL, LDLDIRECT in the last 72 hours.   Thyroid Function Tests: Recent Labs (last 2 labs)   No results for input(s): TSH, T4TOTAL, FREET4, T3FREE, THYROIDAB in the last 72 hours.   Anemia Panel: Recent Labs (last 2 labs)   No results for input(s): VITAMINB12, FOLATE, FERRITIN, TIBC, IRON, RETICCTPCT in the last 72 hours.   Urine analysis: Labs (Brief)            Component Value Date/Time   COLORURINE Straw 02/28/2014 1326   APPEARANCEUR Clear 02/28/2014 1326   LABSPEC 1.010 02/28/2014 1326   PHURINE 5.0 02/28/2014 1326   GLUCOSEU Negative 02/28/2014 1326   HGBUR 1+ 02/28/2014 1326   BILIRUBINUR Negative 02/28/2014 1326   KETONESUR Negative 02/28/2014 1326   PROTEINUR Negative 02/28/2014 1326   NITRITE Negative 02/28/2014 1326   LEUKOCYTESUR Negative 02/28/2014 1326     Sepsis Labs: @LABRCNTIP (procalcitonin:4,lacticidven:4) )No results found for this or any previous visit (from the past 240 hour(s)).   Radiological Exams on Admission:  Imaging Results (Last 48 hours)  Dg Chest 1 View  Result Date: 09/15/2016 CLINICAL DATA:  Back pain.  Recent pneumonia diagnosis. EXAM: CHEST 1 VIEW COMPARISON:  09/13/2016 FINDINGS: Persistent left base consolidation. Right lung remains clear. Pulmonary vasculature is normal. Unchanged hilar and mediastinal contours. No large effusion. IMPRESSION: Unchanged left base consolidation, consistent with pneumonia. No effusion is evident. Electronically Signed   By: Andreas Newport M.D.   On: 09/15/2016 22:30   US Abdomen Limited Ruq  Result Date: 09/16/2016 CLINICAL DATA:  Back pain and increasing the liver function tests. Recent ERCP with CBD stent placement. EXAM: US ABDOMEN LIMITED - RIGHT UPPER QUADRANT COMPARISON:  MRI 09/06/2016 FINDINGS: Gallbladder: Generous gallbladder distention, over 5 cm in short axis. No gallstones or wall thickening visualized. No sonographic Murphy sign noted by sonographer. Common bile duct: Visible stent within the common bile duct.  The duct measures 10 mm. Liver: No focal liver lesion is evident. There is pneumobilia, likely related to the CBD stent. IMPRESSION: 1. Visible CBD stent.  Pneumobilia noted. 2. Generous gallbladder size without mural thickening or pericholecystic fluid. The patient was not tender to probe pressure over the gallbladder. Electronically Signed    By: Andreas Newport M.D.   On: 09/16/2016 00:43     EKG: Pending  Assessment/Plan  This is a 66 y.o. male with a history of chronic diastolic congestive heart failure, end-stage renal disease on hemodialysis status post failed kidney transplant, hypertension, hyperlipidemia, anemia of chronic disease, seizure disorder, gout and type  2 diabetesnow being admitted with:  #. Transaminitis with concern for biliary obstruction given symptoms and recent stent placement for choledocholithiasis and cholangitis.  - Admit inpatient - NPO - GI consult for consideration or MRCP/ERCP - Trend labs  #. Back pain, possibly secondary to pancreatitis vs. MSK etiology -NPO, pain control - We'll check a thoracic spine x-ray to rule out any bony abnormalities.  Consider MRI if XR negative and pain persists - PT eval  #. Recent pneumonia - Continue Cipro  #. ESRD on HD -Nephrology consult for continued dialysis -Continue multivitamin  #. History of chronic diastolic heart failure -Continue Coreg, nitroglycerin, losartan  #. History of coronary artery disease status post MI -Continue aspirin, Plavix, Coreg, nitroglycerin, Lipitor  #. History of GERD -Continue Protonix  #. History of seizures -Continue Keppra  #. Elevated troponins, mild and likely secondary to end-stage renal disease -Trend troponins  #. H/o Diabetes - Accuchecks q4h with RISS coverage  Admission status:Inpatient IV Fluids:HL Diet/Nutrition:NPO Consults called: GI, Renal DVT RR:[id number], SCDs and early ambulation. Code Status:Full Code Disposition Plan:To home in 1-2 days  All the records are reviewed and case discussed with ED provider. Management plans discussed with the patient and/or family who express understanding and agree with plan of care.  Tore Carreker D.O. on 09/16/2016 at 1:47 AM Between 7am to 6pm - Pager - [phone] After 6pm go to [email or web address] - [facility] Office [phone] CC: Primary care physician; Singh,Jasmine, MD   09/16/2016, 1:47 AM

## 2016-09-16 NOTE — H&P (Signed)
[] Hide copied text History and Physical   [facility] - [place] @ [facility] Admission History and Physical [employer], D.O.    Patient Name: [name] MR#: [id number] Date of Birth: Feb 16, 1951 Date of Admission: 09/15/2016  Referring MD/NP/PA: Dr. Archie Balboa Primary Care Physician: [name], MD Patient coming from: Home Outpatient Specialists: Dr. [name]   Chief Complaint:     Chief Complaint  Patient presents with  . Back Pain    HPI: LoyKhabanhis a 66 y.o.malewith a known history of chronic diastolic congestive heart failure, end-stage renal disease on hemodialysis status post failed kidney transplant, hypertension, hyperlipidemia, anemia of chronic disease, seizure disorder, gout and type 2 diabeteswas in a usual state of health until 6 hours prior to arrival when he developed mid back pain which he has had before starting in February 2018.   His pain is associated with vomiting today. He denies any trauma, new exercise.  Of note patient was recently discharged from the hospital yesterday where he was treated for pneumonia, but also had ERCP with stents for choledocholithiasis and cholangitis. His labs had nearly normalized prior to his discharge.  He states he has had similar back pain during his most recent hospitalization.    He has also had a complicated recent course including a, NSTEMI, left heart catheterization, coronary artery bypass grafting and hospitalization at [facility]. He presented to [facility] for chest pain in Sept 2017, was transferred to [facility] where he had bypass surgery on 02/28/2016. His postop course was complicated by seizures. He was transferred to rehabilitation on 03/21/2016. He was seen again in our emergency department in November for syncope and admitted to [facility] again in early January for bradycardia. His metoprolol was discontinued on discharge.  Otherwise there has been no change in status. Patient has been taking medication as  prescribed and there has been no recent change in medication or diet.  No recent antibiotics.  There has been no recent travel or sick contacts.    Review of Systems:  CONSTITUTIONAL: Nofever/chills, fatigue, weakness, weight gain/loss, headache. EYES: No blurry or double vision. ENT: No tinnitus, postnasal drip, redness or soreness of the oropharynx. RESPIRATORY: No cough, dyspnea, wheeze.  No hemoptysis.  CARDIOVASCULAR: No chest pain, palpitations, syncope, orthopnea. No lower extremity edema.  GASTROINTESTINAL: Positive nausea, vomiting, Negative abdominal pain, diarrhea, constipation.  No hematemesis, melena or hematochezia. GENITOURINARY: No dysuria, frequency, hematuria. ENDOCRINE: No polyuria or nocturia. No heat or cold intolerance. HEMATOLOGY: No anemia, bruising, bleeding. INTEGUMENTARY: No rashes, ulcers, lesions. MUSCULOSKELETAL: No arthritis, gout, dyspnea. Positive mid thoracic back pain.  NEUROLOGIC: No numbness, tingling, ataxia, seizure-type activity, weakness. PSYCHIATRIC: No anxiety, depression, insomnia.       Past Medical History:  Diagnosis Date  . Chronic diastolic congestive heart failure ([place])   . Chronic disease anemia   . ESRD (end stage renal disease) on dialysis ([place])    "[place]; [facility]; [name]" (09/29/2014)  . GERD (gastroesophageal reflux disease)   . Gout   . High cholesterol   . History of blood transfusion    "related to anemia"  . History of stomach ulcers   . Hypertension   . Type II diabetes mellitus ([place])          Past Surgical History:  Procedure Laterality Date  . ARTERIOVENOUS GRAFT PLACEMENT Left ~ 1996  . CARDIAC CATHETERIZATION  09/29/2014   "[place]"  . CARDIAC CATHETERIZATION N/A 02/26/2016   Procedure: Left Heart Cath and Coronary Angiography Possible PCI;  Surgeon: [name], MD;  Location: [place] CV LAB;  Service: Cardiovascular;  Laterality: N/A;  . ERCP N/A 09/10/2016   Procedure:  ENDOSCOPIC RETROGRADE CHOLANGIOPANCREATOGRAPHY (ERCP);  Surgeon: [name], MD;  Location: [facility] ENDOSCOPY;  Service: Endoscopy;  Laterality: N/A;  . KIDNEY TRANSPLANT Right 2004  . NEPHRECTOMY TRANSPLANTED ORGAN  2015  . PERCUTANEOUS CORONARY STENT INTERVENTION (PCI-S) N/A 09/30/2014   Procedure: PERCUTANEOUS CORONARY STENT INTERVENTION (PCI-S);  Surgeon: [name], MD;  Location: [facility] CATH LAB;  Service: Cardiovascular;  Laterality: N/A;  . PERITONEAL CATHETER INSERTION  02/2014  . PERITONEAL CATHETER REMOVAL  08/2014  . THROMBECTOMY / ARTERIOVENOUS GRAFT REVISION  2015     reports that he has quit smoking. His smoking use included Cigarettes. He has never used smokeless tobacco. He reports that he does not drink alcohol or use drugs.       Allergies  Allergen Reactions  . Ivp Dye [Iodinated Diagnostic Agents] Other (See Comments)    Pt denied         Family History  Problem Relation Age of Onset  . Hypertension Mother   . Stroke Mother   . Hypertension Father   . Diabetes Mellitus II Father   . Asthma Father            Prior to Admission medications   Medication Sig Start Date End Date Taking? Authorizing Provider  acetaminophen (TYLENOL) 325 MG tablet Take 2 tablets (650 mg total) by mouth every 6 (six) hours as needed for mild pain (or Fever >/= 101). 02/27/16  Yes [name], MD  albuterol (PROVENTIL) (2.5 MG/3ML) 0.083% nebulizer solution Take 2.5 mg by nebulization every 6 (six) hours as needed for wheezing or shortness of breath.   Yes Historical Provider, MD  allopurinol (ZYLOPRIM) 100 MG tablet Take 100 mg by mouth daily. 09/23/14  Yes Historical Provider, MD  aspirin 81 MG EC tablet Take 81 mg by mouth daily. 09/23/14  Yes Historical Provider, MD  atorvastatin (LIPITOR) 40 MG tablet Take 2 tablets (80 mg total) by mouth daily at 6 PM. 09/14/16  Yes [name], MD  B Complex-C (B-COMPLEX WITH VITAMIN C) tablet Take 1 tablet by mouth daily.    Yes Historical Provider, MD  ciprofloxacin (CIPRO) 500 MG tablet Take 1 tablet (500 mg total) by mouth daily. 09/14/16 09/21/16 Yes Bettey Costa, MD  clopidogrel (PLAVIX) 75 MG tablet Take 1 tablet (75 mg total) by mouth daily with breakfast. 10/02/14  Yes [name], MD  epoetin alfa (EPOGEN,PROCRIT) 4000 UNIT/ML injection Inject 4,000 Units into the vein.   Yes Historical Provider, MD  heparin 100-0.45 UNIT/ML-% infusion Inject 1,000 Units/hr into the vein continuous. 02/27/16  Yes [name], MD  levETIRAcetam (KEPPRA) 1000 MG tablet Take 1 tablet (1,000 mg total) by mouth daily. 07/29/16  Yes Henreitta Leber, MD  levETIRAcetam (KEPPRA) 250 MG tablet Take on the Days on Dialysis (Tues, Thurs, Sat) after Dialysis. 07/29/16  Yes Henreitta Leber, MD  meclizine (ANTIVERT) 12.5 MG tablet Take 12.5 mg by mouth 3 (three) times daily as needed. 04/22/16  Yes Historical Provider, MD  Melatonin 3 MG TABS Take 3 mg by mouth at bedtime.   Yes Historical Provider, MD  mirtazapine (REMERON) 7.5 MG tablet Take 7.5 mg by mouth at bedtime.   Yes Historical Provider, MD  multivitamin (RENA-VIT) TABS tablet Take 1 tablet by mouth at bedtime. 10/02/14  Yes [name], MD  nitroGLYCERIN (NITROSTAT) 0.4 MG SL tablet Place 1 tablet (0.4 mg total) under the tongue every  5 (five) minutes x 3 doses as needed for chest pain. 10/02/14  Yes [name], MD  pantoprazole (PROTONIX) 40 MG tablet Take 40 mg by mouth daily. 01/17/14  Yes Historical Provider, MD    Physical Exam:       Vitals:   09/15/16 2135 09/15/16 2136 09/15/16 2300 09/15/16 2330  BP: (!) 168/92  (!) 164/87 (!) 166/80  Pulse: 86  79 75  Resp: 19  (!) 23 16  Temp: 97.4 F (36.3 C)     TempSrc: Oral     SpO2: 97%  98% 100%  Weight:  68.9 kg (152 lb)    Height:  4\' 9"  (1.448 m)     GENERAL: 66 y.o.-year-old [place] malepatient, well-developed, well-nourished lying in the bed in no acute distress. Pleasant and  cooperative.  HEENT: Head atraumatic, normocephalic. Pupils equal, round, reactive to light and accommodation. No scleral icterus. Extraocular muscles intact. Nares are patent. Oropharynx is clear. Mucus membranes moist. NECK: Supple, full range of motion. No JVD, no bruit heard. No thyroid enlargement, no tenderness, no cervical lymphadenopathy. CHEST: Normal breath sounds bilaterally. No wheezing, rales, rhonchi or crackles. No use of accessory muscles of respiration. No reproducible chest wall tenderness.  CARDIOVASCULAR: S1, S2 normal. No murmurs, rubs, or gallops. Cap refill <2 seconds. Pulses intact distally.  ABDOMEN: Soft, nondistended, nontender. No rebound, guarding, rigidity. Normoactive bowel sounds present in all four quadrants. No organomegaly or mass. SPINE: Patient has mild paravertebral muscular spasm over the right mid thoracic region. No midline tenderness, no edema, erythema. EXTREMITIES: No pedal edema, cyanosis, or clubbing. No calf tenderness or Homan's sign.  SPINE: No midline tenderness to palpation.  No significant paravertebral muscle spasm.  NEUROLOGIC: The patient is alert and oriented x 3. Cranial nerves II through XII are grossly intact with no focal sensorimotor deficit. Muscle strength 5/5 in all extremities. Sensation intact. Gait not checked. PSYCHIATRIC: Normal affect, mood, thought content. SKIN: Warm, dry, and intact without obvious rash, lesion, or ulcer.   Labs on Admission:  CBC:  Last Labs    Recent Labs Lab 09/09/16 0401 09/13/16 0624 09/14/16 0336 09/15/16 2250  WBC 6.7 17.5* 15.8* 8.6  HGB 12.2* 10.5* 11.3* 10.3*  HCT 38.4* 33.4* 36.4* 33.2*  MCV 73.9* 73.3* 74.7* 75.3*  PLT 235 229 253 276     Basic Metabolic Panel:  Last Labs    Recent Labs Lab 09/10/16 0601 09/11/16 0417 09/12/16 0435 09/13/16 0624 09/15/16 2250  NA 137 138 138 135 135  K 4.7 4.5 4.5 5.2* 3.9  CL 94* 94* 95* 94* 94*  CO2 28 28 26 26 28   GLUCOSE 106*  124* 102* 106* 211*  BUN 24* 37* 45* 33* 33*  CREATININE 7.78* 10.13* 12.03* 10.20* 9.34*  CALCIUM 8.4* 7.9* 7.0* 7.3* 7.8*     GFR: Estimated Creatinine Clearance: 6 mL/min (A) (by C-G formula based on SCr of 9.34 mg/dL (H)). Liver Function Tests:  Last Labs    Recent Labs Lab 09/09/16 0401 09/10/16 0601 09/11/16 0417 09/12/16 0435 09/15/16 2250  AST 41 43* 42* 32 385*  ALT 86* 68* 57 38 119*  ALKPHOS 241* 209* 203* 165* 409*  BILITOT 1.5* 1.6* 1.5* 1.1 2.3*  PROT 8.1 8.1 8.3* 7.6 7.9  ALBUMIN 3.5 3.6 3.6 3.2* 3.1*      Last Labs    Recent Labs Lab 09/10/16 0601 09/11/16 0417 09/12/16 0435 09/13/16 0624 09/15/16 2250  LIPASE 47 4,082* 1,133* 67* 274*     Last  Labs   No results for input(s): AMMONIA in the last 168 hours.   Coagulation Profile: Last Labs   No results for input(s): INR, PROTIME in the last 168 hours.   Cardiac Enzymes:  Last Labs    Recent Labs Lab 09/15/16 2250  TROPONINI 0.05*     BNP (last 3 results) Recent Labs (within last 365 days)  No results for input(s): PROBNP in the last 8760 hours.   HbA1C: Recent Labs (last 2 labs)   No results for input(s): HGBA1C in the last 72 hours.   CBG:  Last Labs    Recent Labs Lab 09/13/16 0735 09/13/16 1157 09/13/16 1649 09/13/16 2148 09/14/16 0753  GLUCAP 110* 110* 117* 103* 107*     Lipid Profile: Recent Labs (last 2 labs)   No results for input(s): CHOL, HDL, LDLCALC, TRIG, CHOLHDL, LDLDIRECT in the last 72 hours.   Thyroid Function Tests: Recent Labs (last 2 labs)   No results for input(s): TSH, T4TOTAL, FREET4, T3FREE, THYROIDAB in the last 72 hours.   Anemia Panel: Recent Labs (last 2 labs)   No results for input(s): VITAMINB12, FOLATE, FERRITIN, TIBC, IRON, RETICCTPCT in the last 72 hours.   Urine analysis: Labs (Brief)          Component Value Date/Time   COLORURINE Straw 02/28/2014 1326   APPEARANCEUR Clear 02/28/2014 1326   LABSPEC 1.010  02/28/2014 1326   PHURINE 5.0 02/28/2014 1326   GLUCOSEU Negative 02/28/2014 1326   HGBUR 1+ 02/28/2014 1326   BILIRUBINUR Negative 02/28/2014 1326   KETONESUR Negative 02/28/2014 1326   PROTEINUR Negative 02/28/2014 1326   NITRITE Negative 02/28/2014 1326   LEUKOCYTESUR Negative 02/28/2014 1326     Sepsis Labs: @LABRCNTIP (procalcitonin:4,lacticidven:4) )No results found for this or any previous visit (from the past 240 hour(s)).   Radiological Exams on Admission:  Imaging Results (Last 48 hours)  Dg Chest 1 View  Result Date: 09/15/2016 CLINICAL DATA:  Back pain.  Recent pneumonia diagnosis. EXAM: CHEST 1 VIEW COMPARISON:  09/13/2016 FINDINGS: Persistent left base consolidation. Right lung remains clear. Pulmonary vasculature is normal. Unchanged hilar and mediastinal contours. No large effusion. IMPRESSION: Unchanged left base consolidation, consistent with pneumonia. No effusion is evident. Electronically Signed   By: Andreas Newport M.D.   On: 09/15/2016 22:30   US Abdomen Limited Ruq  Result Date: 09/16/2016 CLINICAL DATA:  Back pain and increasing the liver function tests. Recent ERCP with CBD stent placement. EXAM: US ABDOMEN LIMITED - RIGHT UPPER QUADRANT COMPARISON:  MRI 09/06/2016 FINDINGS: Gallbladder: Generous gallbladder distention, over 5 cm in short axis. No gallstones or wall thickening visualized. No sonographic Murphy sign noted by sonographer. Common bile duct: Visible stent within the common bile duct.  The duct measures 10 mm. Liver: No focal liver lesion is evident. There is pneumobilia, likely related to the CBD stent. IMPRESSION: 1. Visible CBD stent.  Pneumobilia noted. 2. Generous gallbladder size without mural thickening or pericholecystic fluid. The patient was not tender to probe pressure over the gallbladder. Electronically Signed   By: Andreas Newport M.D.   On: 09/16/2016 00:43     EKG: Pending  Assessment/Plan  This is a 66 y.o. male  with a history of chronic diastolic congestive heart failure, end-stage renal disease on hemodialysis status post failed kidney transplant, hypertension, hyperlipidemia, anemia of chronic disease, seizure disorder, gout and type 2 diabetesnow being admitted with:  #. Transaminitis with concern for biliary obstruction given symptoms and recent stent placement for choledocholithiasis and  cholangitis.  - Admit inpatient - NPO - GI consult for consideration or MRCP/ERCP - Trend labs  #. Back pain, possibly secondary to pancreatitis vs. MSK etiology -NPO, pain control - We'll check a thoracic spine x-ray to rule out any bony abnormalities.  Consider MRI if XR negative and pain persists - PT eval  #. Recent pneumonia - Continue Cipro  #. ESRD on HD -Nephrology consult for continued dialysis -Continue multivitamin  #. History of chronic diastolic heart failure -Continue Coreg, nitroglycerin, losartan  #. History of coronary artery disease status post MI -Continue aspirin, Plavix, Coreg, nitroglycerin, Lipitor  #. History of GERD -Continue Protonix  #. History of seizures -Continue Keppra  #. Elevated troponins, mild and likely secondary to end-stage renal disease -Trend troponins  #. H/o Diabetes - Accuchecks q4h with RISS coverage  Admission status:Inpatient IV Fluids:HL Diet/Nutrition:NPO Consults called: GI, Renal DVT WU:[id number], SCDs and early ambulation. Code Status:Full Code Disposition Plan:To home in 1-2 days  All the records are reviewed and case discussed with ED provider. Management plans discussed with the patient and/or family who express understanding and agree with plan of care.  Armeda Plumb D.O. on 09/16/2016 at 1:47 AM Between 7am to 6pm - Pager - [phone] After 6pm go to [email or web address] - [employer] [facility] [address] [facility] Office [phone] CC: Primary care physician; Singh,Jasmine, MD   09/16/2016,  1:47 AM

## 2016-09-16 NOTE — Plan of Care (Signed)
Problem: Pain Managment: Goal: General experience of comfort will improve Outcome: Progressing Patient's pain controlled with current medications

## 2016-09-16 NOTE — Care Management (Signed)
Amanda Morris HD liaison notified of admission.  

## 2016-09-16 NOTE — Evaluation (Signed)
Physical Therapy Evaluation Patient Details Name: [name] MRN: [id number] DOB: [birth year] Today's Date: 09/16/2016   History of Present Illness  Pt is a 66 y.o.malewith a known history of chronic diastolic congestive heart failure, end-stage renal disease on hemodialysis status post failed kidney transplant, hypertension, hyperlipidemia, anemia of chronic disease, seizure disorder, gout and type 2 diabeteswas in a usual state of health until 6 hours prior to arrival when he developed mid back pain which he has had before starting in February 2018. His pain is associated with vomiting. He denies any trauma. Of note patient was recently discharged from the hospital where he was treated for pneumonia, but also had ERCP with stents for choledocholithiasis and cholangitis. His labs had nearly normalized prior to his discharge. He states he has had similar back pain during his most recent hospitalization.  He has also had a complicated recent course including a NSTEMI, left heart catheterization, coronary artery bypass grafting and hospitalization at [facility]. He presented to [facility] for chest pain in Sept 2017, was transferred to [facility] where he had bypass surgery on 02/28/2016. His postop course was complicated by seizures. He was seen again in our emergency department in November for syncope and admitted to [facility] again in early January for bradycardia. Current assessment includes: Transaminitis with concern for biliary obstruction, Back pain, possibly secondary to pancreatitis vs. MSK etiology, recent PNA, and ESRD on HD.    Clinical Impression  Pt presents with min deficits in strength, transfers, mobility, gait, balance, and activity tolerance.  Pt was SBA with bed mobility tasks with extra time and effort required.  Pt was SBA with transfers with min verbal cues for proper sequencing.  Pt able to amb 40' with RW and SBA with slow cadence but steady without LOB.  SpO2 96-98% during session with HR  increasing from baseline of 92 bpm to 110 bpm after amb.  Pt will benefit from PT services to address above deficits for decreased caregiver assistance upon discharge.     Follow Up Recommendations Home health PT    Equipment Recommendations  None recommended by PT    Recommendations for Other Services       Precautions / Restrictions Precautions Precautions: Fall Restrictions Weight Bearing Restrictions: No      Mobility  Bed Mobility Overal bed mobility: Needs Assistance Bed Mobility: Supine to Sit;Sit to Supine     Supine to sit: Supervision Sit to supine: Supervision   General bed mobility comments: SBA with bed mobility tasks with extra time and effort required  Transfers Overall transfer level: Needs assistance Equipment used: Rolling walker (2 wheeled) Transfers: Sit to/from Stand Sit to Stand: Supervision         General transfer comment: Pt steady with sit to/from stand but required extra time/effort and min verbal cues for hand placement  Ambulation/Gait Ambulation/Gait assistance: Supervision Ambulation Distance (Feet): 40 Feet Assistive device: Rolling walker (2 wheeled) Gait Pattern/deviations: Decreased step length - left;Decreased step length - right   Gait velocity interpretation: Below normal speed for age/gender General Gait Details: Slow cadence with gait with short B step length but steady without LOB  Stairs Stairs:  (Deferred)          Wheelchair Mobility    Modified Rankin (Stroke Patients Only)       Balance Overall balance assessment: Needs assistance Sitting-balance support: No upper extremity supported Sitting balance-Leahy Scale: Good     Standing balance support: Bilateral upper extremity supported Standing balance-Leahy Scale: Fair  Pertinent Vitals/Pain Pain Assessment: 0-10 Pain Score: 5  Pain Location: Back Pain Descriptors / Indicators: Aching;Sore Pain  Intervention(s): Monitored during session;Limited activity within patient's tolerance    Home Living Family/patient expects to be discharged to:: Private residence Living Arrangements: Spouse/significant other Available Help at Discharge: Family;Available 24 hours/day Type of Home: House Home Access: Stairs to enter Entrance Stairs-Rails: Can reach both;Right;Left Entrance Stairs-Number of Steps: 3 Home Layout: One level Home Equipment: Walker - 2 wheels;Cane - quad      Prior Function           Comments: Pt reports he was ambulatory without AD but would occasionally use his RW if he felt weak, no fall history, Ind with ADLs     Hand Dominance        Extremity/Trunk Assessment        Lower Extremity Assessment Lower Extremity Assessment: Generalized weakness       Communication   Communication: No difficulties  Cognition Arousal/Alertness: Awake/alert Behavior During Therapy: WFL for tasks assessed/performed Overall Cognitive Status: Within Functional Limits for tasks assessed                                        General Comments      Exercises Total Joint Exercises Ankle Circles/Pumps: AROM;Both;10 reps Quad Sets: Strengthening;Both;10 reps Heel Slides: AROM;Both;10 reps Hip ABduction/ADduction: AROM;Both;5 reps Straight Leg Raises: AROM;Both;5 reps Long Arc Quad: AROM;Both;10 reps Knee Flexion: AROM;Both;10 reps   Assessment/Plan    PT Assessment Patient needs continued PT services  PT Problem List Decreased strength;Decreased activity tolerance;Decreased balance;Decreased mobility;Decreased knowledge of use of DME       PT Treatment Interventions DME instruction;Gait training;Stair training;Functional mobility training;Neuromuscular re-education;Balance training;Therapeutic exercise;Therapeutic activities;Patient/family education    PT Goals (Current goals can be found in the Care Plan section)  Acute Rehab PT Goals Patient  Stated Goal: To walk better PT Goal Formulation: With patient Time For Goal Achievement: 09/29/16 Potential to Achieve Goals: Good    Frequency Min 2X/week   Barriers to discharge        Co-evaluation               End of Session Equipment Utilized During Treatment: Gait belt Activity Tolerance: Patient tolerated treatment well Patient left: in bed;with bed alarm set;with call bell/phone within reach;with family/visitor present   PT Visit Diagnosis: Muscle weakness (generalized) (M62.81);Difficulty in walking, not elsewhere classified (R26.2)    Time: 4827-0786 PT Time Calculation (min) (ACUTE ONLY): 27 min   Charges:   PT Evaluation $PT Eval Low Complexity: 1 Procedure PT Treatments $Therapeutic Exercise: 8-22 mins   PT G Codes:        DRoyetta Asal PT, DPT 09/16/16, 11:55 AM

## 2016-09-16 NOTE — Progress Notes (Signed)
[facility]  ROUNDING NOTE   Subjective:   patient to receive ERCP tomorrow. He is also due for dialysis tomorrow. He is resting comfortably in bed at the moment.  Objective:  Vital signs in last 24 hours:  Temp:  [97.4 F (36.3 C)-97.7 F (36.5 C)] 97.6 F (36.4 C) (04/02 1159) Pulse Rate:  [75-92] 79 (04/02 1159) Resp:  [16-23] 16 (04/02 1159) BP: (136-168)/(58-92) 141/58 (04/02 1159) SpO2:  [94 %-100 %] 94 % (04/02 1159) Weight:  [68.7 kg (151 lb 6.4 oz)-68.9 kg (152 lb)] 68.7 kg (151 lb 6.4 oz) (04/02 0322)  Weight change:  Filed Weights   09/15/16 2136 09/16/16 0322  Weight: 68.9 kg (152 lb) 68.7 kg (151 lb 6.4 oz)    Intake/Output: No intake/output data recorded.   Intake/Output this shift:  No intake/output data recorded.  Physical Exam: General: NAD  Head: Normocephalic, atraumatic. Moist oral mucosal membranes  ENT: moist mucus membranes  Neck: Supple, trachea midline  Lungs:  Clear to auscultation  Heart: Regular rate and rhythm  Abdomen:  Soft, mild epigastric tenderness  Extremities: no peripheral edema.  Neurologic: Nonfocal, moving all four extremities  Skin: No lesions  Access: Left AVF    Basic Metabolic Panel:  Recent Labs Lab 09/11/16 0417 09/12/16 0435 09/13/16 0624 09/15/16 2250 09/16/16 0342  NA 138 138 135 135 139  K 4.5 4.5 5.2* 3.9 3.5  CL 94* 95* 94* 94* 95*  CO2 28 26 26 28 28   GLUCOSE 124* 102* 106* 211* 75  BUN 37* 45* 33* 33* 32*  CREATININE 10.13* 12.03* 10.20* 9.34* 9.80*  CALCIUM 7.9* 7.0* 7.3* 7.8* 7.7*  MG  --   --   --   --  2.2  PHOS  --   --   --   --  4.8*    Liver Function Tests:  Recent Labs Lab 09/10/16 0601 09/11/16 0417 09/12/16 0435 09/15/16 2250 09/16/16 0342  AST 43* 42* 32 385* 364*  ALT 68* 57 38 119* 120*  ALKPHOS 209* 203* 165* 409* 390*  BILITOT 1.6* 1.5* 1.1 2.3* 2.8*  PROT 8.1 8.3* 7.6 7.9 7.7  ALBUMIN 3.6 3.6 3.2* 3.1* 2.9*    Recent Labs Lab 09/10/16 0601  09/11/16 0417 09/12/16 0435 09/13/16 0624 09/15/16 2250  LIPASE 47 4,082* 1,133* 67* 274*   No results for input(s): AMMONIA in the last 168 hours.  CBC:  Recent Labs Lab 09/13/16 0624 09/14/16 0336 09/15/16 2250 09/16/16 0342  WBC 17.5* 15.8* 8.6 8.9  HGB 10.5* 11.3* 10.3* 10.1*  HCT 33.4* 36.4* 33.2* 32.2*  MCV 73.3* 74.7* 75.3* 74.6*  PLT 229 253 276 282    Cardiac Enzymes:  Recent Labs Lab 09/15/16 2250 09/16/16 0342 09/16/16 0927  TROPONINI 0.05* 0.05* 0.06*    BNP: Invalid input(s): POCBNP  CBG:  Recent Labs Lab 09/13/16 2148 09/14/16 0753 09/16/16 0350 09/16/16 0744 09/16/16 1131  GLUCAP 103* 107* 77 144* 154*    Microbiology: Results for orders placed or performed during the hospital encounter of 09/04/16  Blood culture (routine x 2)     Status: Abnormal   Collection Time: 09/04/16  9:01 PM  Result Value Ref Range Status   Specimen Description BLOOD RIGHT FOREARM  Final   Special Requests BOTTLES DRAWN AEROBIC AND ANAEROBIC  BCHV  Final   Culture  Setup Time   Final    GRAM NEGATIVE RODS IN BOTH AEROBIC AND ANAEROBIC BOTTLES CRITICAL RESULT CALLED TO, READ BACK BY AND VERIFIED WITH:  HANK ZOMPA 09/05/16 1128 SGD    Culture (A)  Final    ESCHERICHIA COLI SUSCEPTIBILITIES PERFORMED ON PREVIOUS CULTURE WITHIN THE LAST 5 DAYS. Performed at [facility], [place] [address], [place], [place] [address]    Report Status 09/07/2016 FINAL  Final  Blood culture (routine x 2)     Status: Abnormal   Collection Time: 09/04/16  9:01 PM  Result Value Ref Range Status   Specimen Description BLOOD RIGHT ANTECUBITAL  Final   Special Requests BOTTLES DRAWN AEROBIC AND ANAEROBIC  BCHV  Final   Culture  Setup Time   Final    GRAM NEGATIVE RODS IN BOTH AEROBIC AND ANAEROBIC BOTTLES CRITICAL RESULT CALLED TO, READ BACK BY AND VERIFIED WITH: HANK ZOMPA 09/05/16 1128 SGD Performed at [facility], [place] [address], [place],  [address]    Culture  ESCHERICHIA COLI (A)  Final   Report Status 09/07/2016 FINAL  Final   Organism ID, Bacteria ESCHERICHIA COLI  Final      Susceptibility   Escherichia coli - MIC*    AMPICILLIN <=2 SENSITIVE Sensitive     CEFAZOLIN <=4 SENSITIVE Sensitive     CEFEPIME <=1 SENSITIVE Sensitive     CEFTAZIDIME <=1 SENSITIVE Sensitive     CEFTRIAXONE <=1 SENSITIVE Sensitive     CIPROFLOXACIN <=0.25 SENSITIVE Sensitive     GENTAMICIN <=1 SENSITIVE Sensitive     IMIPENEM <=0.25 SENSITIVE Sensitive     TRIMETH/SULFA <=20 SENSITIVE Sensitive     AMPICILLIN/SULBACTAM <=2 SENSITIVE Sensitive     PIP/TAZO <=4 SENSITIVE Sensitive     Extended ESBL NEGATIVE Sensitive     * ESCHERICHIA COLI  Blood Culture ID Panel (Reflexed)     Status: Abnormal   Collection Time: 09/04/16  9:01 PM  Result Value Ref Range Status   Enterococcus species NOT DETECTED NOT DETECTED Final   Listeria monocytogenes NOT DETECTED NOT DETECTED Final   Staphylococcus species NOT DETECTED NOT DETECTED Final   Staphylococcus aureus NOT DETECTED NOT DETECTED Final   Streptococcus species NOT DETECTED NOT DETECTED Final   Streptococcus agalactiae NOT DETECTED NOT DETECTED Final   Streptococcus pneumoniae NOT DETECTED NOT DETECTED Final   Streptococcus pyogenes NOT DETECTED NOT DETECTED Final   Acinetobacter baumannii NOT DETECTED NOT DETECTED Final   Enterobacteriaceae species DETECTED (A) NOT DETECTED Final    Comment: Enterobacteriaceae represent a large family of gram-negative bacteria, not a single organism. CRITICAL RESULT CALLED TO, READ BACK BY AND VERIFIED WITH: HANK ZOMPA 09/05/16 1128 SGD    Enterobacter cloacae complex NOT DETECTED NOT DETECTED Final   Escherichia coli DETECTED (A) NOT DETECTED Final    Comment: CRITICAL RESULT CALLED TO, READ BACK BY AND VERIFIED WITH: HANK ZOMPA 09/05/16 1128 SGD    Klebsiella oxytoca NOT DETECTED NOT DETECTED Final   Klebsiella pneumoniae NOT DETECTED NOT DETECTED Final   Proteus species NOT  DETECTED NOT DETECTED Final   Serratia marcescens NOT DETECTED NOT DETECTED Final   Carbapenem resistance NOT DETECTED NOT DETECTED Final   Haemophilus influenzae NOT DETECTED NOT DETECTED Final   Neisseria meningitidis NOT DETECTED NOT DETECTED Final   Pseudomonas aeruginosa NOT DETECTED NOT DETECTED Final   Candida albicans NOT DETECTED NOT DETECTED Final   Candida glabrata NOT DETECTED NOT DETECTED Final   Candida krusei NOT DETECTED NOT DETECTED Final   Candida parapsilosis NOT DETECTED NOT DETECTED Final   Candida tropicalis NOT DETECTED NOT DETECTED Final  MRSA PCR Screening  Status: None   Collection Time: 09/04/16 11:14 PM  Result Value Ref Range Status   MRSA by PCR NEGATIVE NEGATIVE Final    Comment:        The GeneXpert MRSA Assay (FDA approved for NASAL specimens only), is one component of a comprehensive MRSA colonization surveillance program. It is not intended to diagnose MRSA infection nor to guide or monitor treatment for MRSA infections.     Coagulation Studies: No results for input(s): LABPROT, INR in the last 72 hours.  Urinalysis: No results for input(s): COLORURINE, LABSPEC, PHURINE, GLUCOSEU, HGBUR, BILIRUBINUR, KETONESUR, PROTEINUR, UROBILINOGEN, NITRITE, LEUKOCYTESUR in the last 72 hours.  Invalid input(s): APPERANCEUR    Imaging: Dg Chest 1 View  Result Date: 09/15/2016 CLINICAL DATA:  Back pain.  Recent pneumonia diagnosis. EXAM: CHEST 1 VIEW COMPARISON:  09/13/2016 FINDINGS: Persistent left base consolidation. Right lung remains clear. Pulmonary vasculature is normal. Unchanged hilar and mediastinal contours. No large effusion. IMPRESSION: Unchanged left base consolidation, consistent with pneumonia. No effusion is evident. Electronically Signed   By: Andreas Newport M.D.   On: 09/15/2016 22:30   Dg Thoracic Spine 2 View  Result Date: 09/16/2016 CLINICAL DATA:  Back pain since February 2018. EXAM: THORACIC SPINE 2 VIEWS COMPARISON:  Chest CT  09/04/2016 FINDINGS: Diffuse increased osseous density consistent with renal osteodystrophy. Normal alignment. No acute bony findings. No abnormal paraspinal soft tissue swelling. The visualized ribs are intact. IMPRESSION: Normal alignment and no acute bony findings. Electronically Signed   By: Marijo Sanes M.D.   On: 09/16/2016 07:49   US Abdomen Limited Ruq  Result Date: 09/16/2016 CLINICAL DATA:  Back pain and increasing the liver function tests. Recent ERCP with CBD stent placement. EXAM: US ABDOMEN LIMITED - RIGHT UPPER QUADRANT COMPARISON:  MRI 09/06/2016 FINDINGS: Gallbladder: Generous gallbladder distention, over 5 cm in short axis. No gallstones or wall thickening visualized. No sonographic Murphy sign noted by sonographer. Common bile duct: Visible stent within the common bile duct.  The duct measures 10 mm. Liver: No focal liver lesion is evident. There is pneumobilia, likely related to the CBD stent. IMPRESSION: 1. Visible CBD stent.  Pneumobilia noted. 2. Generous gallbladder size without mural thickening or pericholecystic fluid. The patient was not tender to probe pressure over the gallbladder. Electronically Signed   By: Andreas Newport M.D.   On: 09/16/2016 00:43     Medications:    . allopurinol  100 mg Oral Daily  . aspirin EC  81 mg Oral Daily  . atorvastatin  80 mg Oral q1800  . B-complex with vitamin C  1 tablet Oral Daily  . ciprofloxacin  500 mg Oral Q24H  . insulin aspart  0-15 Units Subcutaneous Q4H  . levETIRAcetam  1,000 mg Oral Daily  . [START ON 09/17/2016] levETIRAcetam  250 mg Oral Once per day on Tue Thu Sat  . Melatonin  5 mg Oral QHS  . mirtazapine  7.5 mg Oral QHS  . multivitamin  1 tablet Oral QHS  . pantoprazole  40 mg Oral Daily   acetaminophen **OR** acetaminophen, albuterol, bisacodyl, ipratropium, magnesium citrate, meclizine, nitroGLYCERIN, ondansetron **OR** ondansetron (ZOFRAN) IV, oxyCODONE, senna-docusate  Assessment/ Plan:  [name]  is a 66 y.o. 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   TTS [facility]  1. End Stage Renal Disease.  We will plan for hemodialysis  tomorrow.  2.  Secondary Hyperparathyroidism: we plan to check serum phosphorus with dialysis tomorrow.  3. Anemia of chronic kidney disease:  Hemoglobin currently down to 10.1.  We will likely resume Epogen with dialysis tomorrow.  4. Common bile duct stone with Escherichia coli bacteremia. ERCP with stent placement done on 3/27, will need repeat ERCP tomorrow with spinchterotomy tomorrow.        LOS: 0 Kailin Principato 4/2/20183:02 PM

## 2016-09-16 NOTE — H&P (Signed)
History and Physical   [facility] - [place] @ [facility] Admission History and Physical [employer], D.O.    Patient Name: [name] MR#: [id number] Date of Birth: Feb 29, 1951 Date of Admission: 09/15/2016  Referring MD/NP/PA: Dr. Archie Balboa Primary Care Physician: [name], MD Patient coming from: Home   Chief Complaint:  Chief Complaint  Patient presents with  . Back Pain    HPI: [name] is a 66 y.o. male with a known history of chronic diastolic congestive heart failure, end-stage renal disease on hemodialysis status post failed kidney transplant, hypertension, hyperlipidemia, anemia of chronic disease, seizure disorder, gout and type 2 diabetes was in a usual state of health until 6 hours prior to arrival when he developed mid back pain which he has had before  His pain is associated with vomiting today. He denies any trauma, new exercise.  Of note patient was recently discharged from the hospital yesterday where he was treated for pneumonia, but also had ERCP with stents for choledocholithiasis and cholangitis. His labs had nearly normalized prior to his discharge.  He states he has had similar back pain during his most recent hospitalization.    He has also had a complicated recent course including a, NSTEMI, left heart catheterization, coronary artery bypass grafting and hospitalization at [facility]. He presented to [facility] for chest pain in Sept 2017, was transferred to [facility] where he had bypass surgery on 02/28/2016. His postop course was complicated by seizures. He was transferred to rehabilitation on 03/21/2016. He was seen again in our emergency department in November for syncope and admitted to [facility] again in early January for bradycardia. His metoprolol was discontinued on discharge.  Otherwise there has been no change in status. Patient has been taking medication as prescribed and there has been no recent change in medication or diet.  No recent antibiotics.  There  has been no recent illness, hospitalizations, travel or sick contacts.    Review of Systems:  CONSTITUTIONAL: No fever/chills, fatigue, weakness, weight gain/loss, headache. EYES: No blurry or double vision. ENT: No tinnitus, postnasal drip, redness or soreness of the oropharynx. RESPIRATORY: No cough, dyspnea, wheeze.  No hemoptysis.  CARDIOVASCULAR: No chest pain, palpitations, syncope, orthopnea. No lower extremity edema.  GASTROINTESTINAL: No nausea, vomiting, abdominal pain, diarrhea, constipation.  No hematemesis, melena or hematochezia. GENITOURINARY: No dysuria, frequency, hematuria. ENDOCRINE: No polyuria or nocturia. No heat or cold intolerance. HEMATOLOGY: No anemia, bruising, bleeding. INTEGUMENTARY: No rashes, ulcers, lesions. MUSCULOSKELETAL: No arthritis, gout, dyspnea. NEUROLOGIC: No numbness, tingling, ataxia, seizure-type activity, weakness. PSYCHIATRIC: No anxiety, depression, insomnia.   Past Medical History:  Diagnosis Date  . Chronic diastolic congestive heart failure ([place])   . Chronic disease anemia   . ESRD (end stage renal disease) on dialysis ([place])    "[place]; [place]; [name]" (09/29/2014)  . GERD (gastroesophageal reflux disease)   . Gout   . High cholesterol   . History of blood transfusion    "related to anemia"  . History of stomach ulcers   . Hypertension   . Type II diabetes mellitus ([place])     Past Surgical History:  Procedure Laterality Date  . ARTERIOVENOUS GRAFT PLACEMENT Left ~ 1996  . CARDIAC CATHETERIZATION  09/29/2014   "[place]"  . CARDIAC CATHETERIZATION N/A 02/26/2016   Procedure: Left Heart Cath and Coronary Angiography Possible PCI;  Surgeon: [name], MD;  Location: [place] CV LAB;  Service: Cardiovascular;  Laterality: N/A;  . ERCP N/A 09/10/2016   Procedure: ENDOSCOPIC RETROGRADE CHOLANGIOPANCREATOGRAPHY (ERCP);  Surgeon: [name], MD;  Location: [facility] ENDOSCOPY;  Service: Endoscopy;  Laterality: N/A;  . KIDNEY  TRANSPLANT Right 2004  . NEPHRECTOMY TRANSPLANTED ORGAN  2015  . PERCUTANEOUS CORONARY STENT INTERVENTION (PCI-S) N/A 09/30/2014   Procedure: PERCUTANEOUS CORONARY STENT INTERVENTION (PCI-S);  Surgeon: [name], MD;  Location: [facility] CATH LAB;  Service: Cardiovascular;  Laterality: N/A;  . PERITONEAL CATHETER INSERTION  02/2014  . PERITONEAL CATHETER REMOVAL  08/2014  . THROMBECTOMY / ARTERIOVENOUS GRAFT REVISION  2015     reports that he has quit smoking. His smoking use included Cigarettes. He has never used smokeless tobacco. He reports that he does not drink alcohol or use drugs.  Allergies  Allergen Reactions  . Ivp Dye [Iodinated Diagnostic Agents] Other (See Comments)    Pt denied    Family History  Problem Relation Age of Onset  . Hypertension Mother   . Stroke Mother   . Hypertension Father   . Diabetes Mellitus II Father   . Asthma Father     Prior to Admission medications   Medication Sig Start Date End Date Taking? Authorizing Provider  acetaminophen (TYLENOL) 325 MG tablet Take 2 tablets (650 mg total) by mouth every 6 (six) hours as needed for mild pain (or Fever >/= 101). 02/27/16  Yes [name], MD  albuterol (PROVENTIL) (2.5 MG/3ML) 0.083% nebulizer solution Take 2.5 mg by nebulization every 6 (six) hours as needed for wheezing or shortness of breath.   Yes Historical Provider, MD  allopurinol (ZYLOPRIM) 100 MG tablet Take 100 mg by mouth daily. 09/23/14  Yes Historical Provider, MD  aspirin 81 MG EC tablet Take 81 mg by mouth daily. 09/23/14  Yes Historical Provider, MD  atorvastatin (LIPITOR) 40 MG tablet Take 2 tablets (80 mg total) by mouth daily at 6 PM. 09/14/16  Yes [name], MD  B Complex-C (B-COMPLEX WITH VITAMIN C) tablet Take 1 tablet by mouth daily.   Yes Historical Provider, MD  ciprofloxacin (CIPRO) 500 MG tablet Take 1 tablet (500 mg total) by mouth daily. 09/14/16 09/21/16 Yes Bettey Costa, MD  clopidogrel (PLAVIX) 75 MG tablet Take 1 tablet (75 mg  total) by mouth daily with breakfast. 10/02/14  Yes [name], MD  epoetin alfa (EPOGEN,PROCRIT) 4000 UNIT/ML injection Inject 4,000 Units into the vein.   Yes Historical Provider, MD  heparin 100-0.45 UNIT/ML-% infusion Inject 1,000 Units/hr into the vein continuous. 02/27/16  Yes [name], MD  levETIRAcetam (KEPPRA) 1000 MG tablet Take 1 tablet (1,000 mg total) by mouth daily. 07/29/16  Yes Henreitta Leber, MD  levETIRAcetam (KEPPRA) 250 MG tablet Take on the Days on Dialysis (Tues, Thurs, Sat) after Dialysis. 07/29/16  Yes Henreitta Leber, MD  meclizine (ANTIVERT) 12.5 MG tablet Take 12.5 mg by mouth 3 (three) times daily as needed. 04/22/16  Yes Historical Provider, MD  Melatonin 3 MG TABS Take 3 mg by mouth at bedtime.   Yes Historical Provider, MD  mirtazapine (REMERON) 7.5 MG tablet Take 7.5 mg by mouth at bedtime.   Yes Historical Provider, MD  multivitamin (RENA-VIT) TABS tablet Take 1 tablet by mouth at bedtime. 10/02/14  Yes [name], MD  nitroGLYCERIN (NITROSTAT) 0.4 MG SL tablet Place 1 tablet (0.4 mg total) under the tongue every 5 (five) minutes x 3 doses as needed for chest pain. 10/02/14  Yes [name], MD  pantoprazole (PROTONIX) 40 MG tablet Take 40 mg by mouth daily. 01/17/14  Yes Historical Provider, MD    Physical Exam: Vitals:  09/15/16 2135 09/15/16 2136 09/15/16 2300 09/15/16 2330  BP: (!) 168/92  (!) 164/87 (!) 166/80  Pulse: 86  79 75  Resp: 19  (!) 23 16  Temp: 97.4 F (36.3 C)     TempSrc: Oral     SpO2: 97%  98% 100%  Weight:  68.9 kg (152 lb)    Height:  4\' 9"  (1.448 m)     GENERAL: 66 y.o.-year-old [place] male patient, well-developed, well-nourished lying in the bed in no acute distress.  Pleasant and cooperative.   HEENT: Head atraumatic, normocephalic. Pupils equal, round, reactive to light and accommodation. No scleral icterus. Extraocular muscles intact. Nares are patent. Oropharynx is clear. Mucus membranes moist. NECK: Supple, full  range of motion. No JVD, no bruit heard. No thyroid enlargement, no tenderness, no cervical lymphadenopathy. CHEST: Normal breath sounds bilaterally. No wheezing, rales, rhonchi or crackles. No use of accessory muscles of respiration.  No reproducible chest wall tenderness.  CARDIOVASCULAR: S1, S2 normal. No murmurs, rubs, or gallops. Cap refill <2 seconds. Pulses intact distally.  ABDOMEN: Soft, nondistended, nontender. No rebound, guarding, rigidity. Normoactive bowel sounds present in all four quadrants. No organomegaly or mass. SPINE: Patient has mild paravertebral muscular spasm over the right mid thoracic region. No midline tenderness, no edema, erythema. EXTREMITIES: No pedal edema, cyanosis, or clubbing. No calf tenderness or Homan's sign.  SPINE: No midline tenderness to palpation NEUROLOGIC: The patient is alert and oriented x 3. Cranial nerves II through XII are grossly intact with no focal sensorimotor deficit. Muscle strength 5/5 in all extremities. Sensation intact. Gait not checked. PSYCHIATRIC:  Normal affect, mood, thought content. SKIN: Warm, dry, and intact without obvious rash, lesion, or ulcer.   Labs on Admission:  CBC:  Recent Labs Lab 09/09/16 0401 09/13/16 0624 09/14/16 0336 09/15/16 2250  WBC 6.7 17.5* 15.8* 8.6  HGB 12.2* 10.5* 11.3* 10.3*  HCT 38.4* 33.4* 36.4* 33.2*  MCV 73.9* 73.3* 74.7* 75.3*  PLT 235 229 253 812   Basic Metabolic Panel:  Recent Labs Lab 09/10/16 0601 09/11/16 0417 09/12/16 0435 09/13/16 0624 09/15/16 2250  NA 137 138 138 135 135  K 4.7 4.5 4.5 5.2* 3.9  CL 94* 94* 95* 94* 94*  CO2 28 28 26 26 28   GLUCOSE 106* 124* 102* 106* 211*  BUN 24* 37* 45* 33* 33*  CREATININE 7.78* 10.13* 12.03* 10.20* 9.34*  CALCIUM 8.4* 7.9* 7.0* 7.3* 7.8*   GFR: Estimated Creatinine Clearance: 6 mL/min (A) (by C-G formula based on SCr of 9.34 mg/dL (H)). Liver Function Tests:  Recent Labs Lab 09/09/16 0401 09/10/16 0601 09/11/16 0417  09/12/16 0435 09/15/16 2250  AST 41 43* 42* 32 385*  ALT 86* 68* 57 38 119*  ALKPHOS 241* 209* 203* 165* 409*  BILITOT 1.5* 1.6* 1.5* 1.1 2.3*  PROT 8.1 8.1 8.3* 7.6 7.9  ALBUMIN 3.5 3.6 3.6 3.2* 3.1*    Recent Labs Lab 09/10/16 0601 09/11/16 0417 09/12/16 0435 09/13/16 0624 09/15/16 2250  LIPASE 47 4,082* 1,133* 67* 274*   No results for input(s): AMMONIA in the last 168 hours. Coagulation Profile: No results for input(s): INR, PROTIME in the last 168 hours. Cardiac Enzymes:  Recent Labs Lab 09/15/16 2250  TROPONINI 0.05*   BNP (last 3 results) No results for input(s): PROBNP in the last 8760 hours. HbA1C: No results for input(s): HGBA1C in the last 72 hours. CBG:  Recent Labs Lab 09/13/16 0735 09/13/16 1157 09/13/16 1649 09/13/16 2148 09/14/16 0753  GLUCAP 110* 110*  117* 103* 107*   Lipid Profile: No results for input(s): CHOL, HDL, LDLCALC, TRIG, CHOLHDL, LDLDIRECT in the last 72 hours. Thyroid Function Tests: No results for input(s): TSH, T4TOTAL, FREET4, T3FREE, THYROIDAB in the last 72 hours. Anemia Panel: No results for input(s): VITAMINB12, FOLATE, FERRITIN, TIBC, IRON, RETICCTPCT in the last 72 hours. Urine analysis:    Component Value Date/Time   COLORURINE Straw 02/28/2014 1326   APPEARANCEUR Clear 02/28/2014 1326   LABSPEC 1.010 02/28/2014 1326   PHURINE 5.0 02/28/2014 1326   GLUCOSEU Negative 02/28/2014 1326   HGBUR 1+ 02/28/2014 1326   BILIRUBINUR Negative 02/28/2014 1326   KETONESUR Negative 02/28/2014 1326   PROTEINUR Negative 02/28/2014 1326   NITRITE Negative 02/28/2014 1326   LEUKOCYTESUR Negative 02/28/2014 1326   Sepsis Labs: @LABRCNTIP (procalcitonin:4,lacticidven:4) )No results found for this or any previous visit (from the past 240 hour(s)).   Radiological Exams on Admission: Dg Chest 1 View  Result Date: 09/15/2016 CLINICAL DATA:  Back pain.  Recent pneumonia diagnosis. EXAM: CHEST 1 VIEW COMPARISON:  09/13/2016 FINDINGS:  Persistent left base consolidation. Right lung remains clear. Pulmonary vasculature is normal. Unchanged hilar and mediastinal contours. No large effusion. IMPRESSION: Unchanged left base consolidation, consistent with pneumonia. No effusion is evident. Electronically Signed   By: Andreas Newport M.D.   On: 09/15/2016 22:30   US Abdomen Limited Ruq  Result Date: 09/16/2016 CLINICAL DATA:  Back pain and increasing the liver function tests. Recent ERCP with CBD stent placement. EXAM: US ABDOMEN LIMITED - RIGHT UPPER QUADRANT COMPARISON:  MRI 09/06/2016 FINDINGS: Gallbladder: Generous gallbladder distention, over 5 cm in short axis. No gallstones or wall thickening visualized. No sonographic Murphy sign noted by sonographer. Common bile duct: Visible stent within the common bile duct.  The duct measures 10 mm. Liver: No focal liver lesion is evident. There is pneumobilia, likely related to the CBD stent. IMPRESSION: 1. Visible CBD stent.  Pneumobilia noted. 2. Generous gallbladder size without mural thickening or pericholecystic fluid. The patient was not tender to probe pressure over the gallbladder. Electronically Signed   By: Andreas Newport M.D.   On: 09/16/2016 00:43    EKG: Pending  Assessment/Plan  This is a 66 y.o. male with a history of chronic diastolic congestive heart failure, end-stage renal disease on hemodialysis status post failed kidney transplant, hypertension, hyperlipidemia, anemia of chronic disease, seizure disorder, gout and type 2 diabetes now being admitted with:  #. Transaminitis with concern for biliary obstruction given symptoms and recent stent placement for choledocholithiasis and cholangitis.  - Admit inpatient - NPO - GI consult for consideration or MRCP/ERCP - Trend labs  #. Back pain, possibly secondary to pancreatitis -NPO, pain control - We'll check a thoracic spine x-ray to rule out any bony abnormalities - PT eval  #. Recent pneumonia - Continue  Cipro  #. ESRD on HD -Nephrology consult for continued dialysis -Continue multivitamin  #. History of chronic diastolic heart failure -Continue Coreg, nitroglycerin, losartan  #. History of coronary artery disease status post MI -Continue aspirin, Plavix, Coreg, nitroglycerin, Lipitor  #. History of GERD -Continue Protonix  #. History of seizures -Continue Keppra  #. Elevated troponins, mild and likely secondary to end-stage renal disease -Trend troponins  #. H/o Diabetes - Accuchecks q4h with RISS coverage  Admission status: Inpatient IV Fluids: HL Diet/Nutrition: NPO Consults called: [place] DVT Px: Heparin, SCDs and early ambulation. Code Status: Full Code  Disposition Plan: To home in 1-2 days   All the records are reviewed  and case discussed with ED provider. Management plans discussed with the patient and/or family who express understanding and agree with plan of care.  Tywanda Rice D.O. on 09/16/2016 at 1:47 AM Between 7am to 6pm - Pager - [phone] After 6pm go to [email or web address] - [employer] [facility]  Hospitalists Office [phone] CC: Primary care physician; Singh,Jasmine, MD   09/16/2016, 1:47 AM

## 2016-09-16 NOTE — Progress Notes (Signed)
Notified Dr. Ara Kussmaul of patient NPO status; verbal order to make patient NPO except sips with meds; Will continue to monitor.

## 2016-09-16 NOTE — Progress Notes (Signed)
Unable to obtain IV access after multiple attempts by both ED at time of admission and Herby Abraham, Therapist, sports.  Per admitting primary RN Dr. Ara Kussmaul stated it was okay to be without IV access as nothing was ordered via IV.  Patient will have procedure tomorrow.  Spoke with Eulis Manly, RN, and it was felt that given patient is an extremely hard stick it would be best to wait to obtain access just prior to procedure.

## 2016-09-17 ENCOUNTER — Inpatient Hospital Stay: Payer: Medicare Other | Admitting: Anesthesiology

## 2016-09-17 ENCOUNTER — Encounter: Payer: Self-pay | Admitting: Anesthesiology

## 2016-09-17 ENCOUNTER — Encounter
Admission: EM | Disposition: A | Discharge status: Home Health | Payer: Self-pay | Source: Home / Self Care | Attending: Internal Medicine

## 2016-09-17 DIAGNOSIS — K8050 Calculus of bile duct without cholangitis or cholecystitis without obstruction: Secondary | ICD-10-CM

## 2016-09-17 DIAGNOSIS — Z4659 Encounter for fitting and adjustment of other gastrointestinal appliance and device: Secondary | ICD-10-CM

## 2016-09-17 HISTORY — PX: ENDOSCOPIC RETROGRADE CHOLANGIOPANCREATOGRAPHY (ERCP) WITH PROPOFOL: SHX5810

## 2016-09-17 LAB — GLUCOSE, CAPILLARY
GLUCOSE-CAPILLARY: 110 mg/dL — AB (ref 65–99)
GLUCOSE-CAPILLARY: 107 mg/dL — AB (ref 65–99)
Glucose-Capillary: 235 mg/dL — ABNORMAL HIGH (ref 65–99)
Glucose-Capillary: 84 mg/dL (ref 65–99)

## 2016-09-17 SURGERY — ENDOSCOPIC RETROGRADE CHOLANGIOPANCREATOGRAPHY (ERCP) WITH PROPOFOL
Anesthesia: General

## 2016-09-17 MED ORDER — PHENYLEPHRINE HCL 10 MG/ML IJ SOLN
INTRAMUSCULAR | Status: DC | PRN
  Administered 2016-09-17 (×2): 100 ug via INTRAVENOUS

## 2016-09-17 MED ORDER — PROPOFOL 500 MG/50ML IV EMUL
INTRAVENOUS | Status: AC
  Filled 2016-09-17: qty 50

## 2016-09-17 MED ORDER — SODIUM CHLORIDE 0.9 % IV SOLN
INTRAVENOUS | Status: DC | PRN
  Administered 2016-09-17: 13:00:00 via INTRAVENOUS

## 2016-09-17 MED ORDER — INDOMETHACIN 50 MG RE SUPP
100.0000 mg | Freq: Once | RECTAL | Status: AC
  Administered 2016-09-17: 100 mg via RECTAL

## 2016-09-17 MED ORDER — PROPOFOL 10 MG/ML IV BOLUS
INTRAVENOUS | Status: DC | PRN
  Administered 2016-09-17 (×2): 40 mg via INTRAVENOUS

## 2016-09-17 MED ORDER — DIPHENHYDRAMINE HCL 50 MG/ML IJ SOLN
INTRAMUSCULAR | Status: AC
  Filled 2016-09-17: qty 1

## 2016-09-17 MED ORDER — HYDRALAZINE HCL 20 MG/ML IJ SOLN: 10.0000 mg | Freq: Four times a day (QID) | INTRAMUSCULAR | Status: DC | PRN

## 2016-09-17 MED ORDER — CLOPIDOGREL BISULFATE 75 MG PO TABS
75.0000 mg | ORAL_TABLET | Freq: Every day | ORAL | Status: DC
  Administered 2016-09-18: 75 mg via ORAL
  Filled 2016-09-17: qty 1

## 2016-09-17 MED ORDER — PROPOFOL 10 MG/ML IV BOLUS
INTRAVENOUS | Status: AC
  Filled 2016-09-17: qty 20

## 2016-09-17 MED ORDER — LOSARTAN POTASSIUM 25 MG PO TABS
25.0000 mg | ORAL_TABLET | Freq: Every day | ORAL | Status: DC
  Administered 2016-09-18: 25 mg via ORAL
  Filled 2016-09-17: qty 1

## 2016-09-17 MED ORDER — DIPHENHYDRAMINE HCL 50 MG/ML IJ SOLN
INTRAMUSCULAR | Status: DC | PRN
  Administered 2016-09-17: 50 mg via INTRAVENOUS

## 2016-09-17 MED ORDER — CARVEDILOL 6.25 MG PO TABS
6.2500 mg | ORAL_TABLET | Freq: Two times a day (BID) | ORAL | Status: DC
  Administered 2016-09-18: 6.25 mg via ORAL
  Filled 2016-09-17: qty 1

## 2016-09-17 MED ORDER — PROPOFOL 500 MG/50ML IV EMUL
INTRAVENOUS | Status: DC | PRN
  Administered 2016-09-17: 150 ug/kg/min via INTRAVENOUS

## 2016-09-17 NOTE — Progress Notes (Signed)
Post HD  

## 2016-09-17 NOTE — Progress Notes (Signed)
[facility]  ROUNDING NOTE   Subjective:  Pt seen at bedside. Due for ERCP at the moment.  Will need dialysis afterwords.   Objective:  Vital signs in last 24 hours:  Temp:  [98.1 F (36.7 C)-98.4 F (36.9 C)] 98.3 F (36.8 C) (04/03 1103) Pulse Rate:  [78-84] 78 (04/03 1103) Resp:  [14-20] 14 (04/03 1103) BP: (151-186)/(79-80) 151/80 (04/03 1103) SpO2:  [95 %-100 %] 95 % (04/03 0453)  Weight change:  Filed Weights   09/15/16 2136 09/16/16 0322  Weight: 68.9 kg (152 lb) 68.7 kg (151 lb 6.4 oz)    Intake/Output: I/O last 3 completed shifts: In: 120 [P.O.:120] Out: 0    Intake/Output this shift:  No intake/output data recorded.  Physical Exam: General: NAD  Head: Normocephalic, atraumatic. Moist oral mucosal membranes  ENT: moist mucus membranes  Neck: Supple, trachea midline  Lungs:  Clear to auscultation  Heart: Regular rate and rhythm  Abdomen:  Soft, mild epigastric tenderness  Extremities: no peripheral edema.  Neurologic: Nonfocal, moving all four extremities  Skin: No lesions  Access: Left AVF    Basic Metabolic Panel:  Recent Labs Lab 09/11/16 0417 09/12/16 0435 09/13/16 0624 09/15/16 2250 09/16/16 0342  NA 138 138 135 135 139  K 4.5 4.5 5.2* 3.9 3.5  CL 94* 95* 94* 94* 95*  CO2 28 26 26 28 28   GLUCOSE 124* 102* 106* 211* 75  BUN 37* 45* 33* 33* 32*  CREATININE 10.13* 12.03* 10.20* 9.34* 9.80*  CALCIUM 7.9* 7.0* 7.3* 7.8* 7.7*  MG  --   --   --   --  2.2  PHOS  --   --   --   --  4.8*    Liver Function Tests:  Recent Labs Lab 09/11/16 0417 09/12/16 0435 09/15/16 2250 09/16/16 0342  AST 42* 32 385* 364*  ALT 57 38 119* 120*  ALKPHOS 203* 165* 409* 390*  BILITOT 1.5* 1.1 2.3* 2.8*  PROT 8.3* 7.6 7.9 7.7  ALBUMIN 3.6 3.2* 3.1* 2.9*    Recent Labs Lab 09/11/16 0417 09/12/16 0435 09/13/16 0624 09/15/16 2250  LIPASE 4,082* 1,133* 67* 274*   No results for input(s): AMMONIA in the last 168  hours.  CBC:  Recent Labs Lab 09/13/16 0624 09/14/16 0336 09/15/16 2250 09/16/16 0342  WBC 17.5* 15.8* 8.6 8.9  HGB 10.5* 11.3* 10.3* 10.1*  HCT 33.4* 36.4* 33.2* 32.2*  MCV 73.3* 74.7* 75.3* 74.6*  PLT 229 253 276 282    Cardiac Enzymes:  Recent Labs Lab 09/15/16 2250 09/16/16 0342 09/16/16 0927  TROPONINI 0.05* 0.05* 0.06*    BNP: Invalid input(s): POCBNP  CBG:  Recent Labs Lab 09/16/16 1605 09/16/16 1945 09/16/16 2329 09/17/16 0302 09/17/16 0722  GLUCAP 134* 135* 67 110* 107*    Microbiology: Results for orders placed or performed during the hospital encounter of 09/04/16  Blood culture (routine x 2)     Status: Abnormal   Collection Time: 09/04/16  9:01 PM  Result Value Ref Range Status   Specimen Description BLOOD RIGHT FOREARM  Final   Special Requests BOTTLES DRAWN AEROBIC AND ANAEROBIC  BCHV  Final   Culture  Setup Time   Final    GRAM NEGATIVE RODS IN BOTH AEROBIC AND ANAEROBIC BOTTLES CRITICAL RESULT CALLED TO, READ BACK BY AND VERIFIED WITH: HANK ZOMPA 09/05/16 1128 SGD    Culture (A)  Final    ESCHERICHIA COLI SUSCEPTIBILITIES PERFORMED ON PREVIOUS CULTURE WITHIN THE LAST 5 DAYS. Performed at  [facility], [place] [address], [place], [place] [address]    Report Status 09/07/2016 FINAL  Final  Blood culture (routine x 2)     Status: Abnormal   Collection Time: 09/04/16  9:01 PM  Result Value Ref Range Status   Specimen Description BLOOD RIGHT ANTECUBITAL  Final   Special Requests BOTTLES DRAWN AEROBIC AND ANAEROBIC  BCHV  Final   Culture  Setup Time   Final    GRAM NEGATIVE RODS IN BOTH AEROBIC AND ANAEROBIC BOTTLES CRITICAL RESULT CALLED TO, READ BACK BY AND VERIFIED WITH: HANK ZOMPA 09/05/16 1128 SGD Performed at [facility], [place] [address], [place], [place] [address]    Culture ESCHERICHIA COLI (A)  Final   Report Status 09/07/2016 FINAL  Final   Organism ID, Bacteria ESCHERICHIA COLI  Final      Susceptibility    Escherichia coli - MIC*    AMPICILLIN <=2 SENSITIVE Sensitive     CEFAZOLIN <=4 SENSITIVE Sensitive     CEFEPIME <=1 SENSITIVE Sensitive     CEFTAZIDIME <=1 SENSITIVE Sensitive     CEFTRIAXONE <=1 SENSITIVE Sensitive     CIPROFLOXACIN <=0.25 SENSITIVE Sensitive     GENTAMICIN <=1 SENSITIVE Sensitive     IMIPENEM <=0.25 SENSITIVE Sensitive     TRIMETH/SULFA <=20 SENSITIVE Sensitive     AMPICILLIN/SULBACTAM <=2 SENSITIVE Sensitive     PIP/TAZO <=4 SENSITIVE Sensitive     Extended ESBL NEGATIVE Sensitive     * ESCHERICHIA COLI  Blood Culture ID Panel (Reflexed)     Status: Abnormal   Collection Time: 09/04/16  9:01 PM  Result Value Ref Range Status   Enterococcus species NOT DETECTED NOT DETECTED Final   Listeria monocytogenes NOT DETECTED NOT DETECTED Final   Staphylococcus species NOT DETECTED NOT DETECTED Final   Staphylococcus aureus NOT DETECTED NOT DETECTED Final   Streptococcus species NOT DETECTED NOT DETECTED Final   Streptococcus agalactiae NOT DETECTED NOT DETECTED Final   Streptococcus pneumoniae NOT DETECTED NOT DETECTED Final   Streptococcus pyogenes NOT DETECTED NOT DETECTED Final   Acinetobacter baumannii NOT DETECTED NOT DETECTED Final   Enterobacteriaceae species DETECTED (A) NOT DETECTED Final    Comment: Enterobacteriaceae represent a large family of gram-negative bacteria, not a single organism. CRITICAL RESULT CALLED TO, READ BACK BY AND VERIFIED WITH: HANK ZOMPA 09/05/16 1128 SGD    Enterobacter cloacae complex NOT DETECTED NOT DETECTED Final   Escherichia coli DETECTED (A) NOT DETECTED Final    Comment: CRITICAL RESULT CALLED TO, READ BACK BY AND VERIFIED WITH: HANK ZOMPA 09/05/16 1128 SGD    Klebsiella oxytoca NOT DETECTED NOT DETECTED Final   Klebsiella pneumoniae NOT DETECTED NOT DETECTED Final   Proteus species NOT DETECTED NOT DETECTED Final   Serratia marcescens NOT DETECTED NOT DETECTED Final   Carbapenem resistance NOT DETECTED NOT DETECTED Final    Haemophilus influenzae NOT DETECTED NOT DETECTED Final   Neisseria meningitidis NOT DETECTED NOT DETECTED Final   Pseudomonas aeruginosa NOT DETECTED NOT DETECTED Final   Candida albicans NOT DETECTED NOT DETECTED Final   Candida glabrata NOT DETECTED NOT DETECTED Final   Candida krusei NOT DETECTED NOT DETECTED Final   Candida parapsilosis NOT DETECTED NOT DETECTED Final   Candida tropicalis NOT DETECTED NOT DETECTED Final  MRSA PCR Screening     Status: None   Collection Time: 09/04/16 11:14 PM  Result Value Ref Range Status   MRSA by PCR NEGATIVE NEGATIVE Final    Comment:  The GeneXpert MRSA Assay (FDA approved for NASAL specimens only), is one component of a comprehensive MRSA colonization surveillance program. It is not intended to diagnose MRSA infection nor to guide or monitor treatment for MRSA infections.     Coagulation Studies: No results for input(s): LABPROT, INR in the last 72 hours.  Urinalysis: No results for input(s): COLORURINE, LABSPEC, PHURINE, GLUCOSEU, HGBUR, BILIRUBINUR, KETONESUR, PROTEINUR, UROBILINOGEN, NITRITE, LEUKOCYTESUR in the last 72 hours.  Invalid input(s): APPERANCEUR    Imaging: Dg Chest 1 View  Result Date: 09/15/2016 CLINICAL DATA:  Back pain.  Recent pneumonia diagnosis. EXAM: CHEST 1 VIEW COMPARISON:  09/13/2016 FINDINGS: Persistent left base consolidation. Right lung remains clear. Pulmonary vasculature is normal. Unchanged hilar and mediastinal contours. No large effusion. IMPRESSION: Unchanged left base consolidation, consistent with pneumonia. No effusion is evident. Electronically Signed   By: Andreas Newport M.D.   On: 09/15/2016 22:30   Dg Thoracic Spine 2 View  Result Date: 09/16/2016 CLINICAL DATA:  Back pain since February 2018. 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 Normal alignment. No acute bony findings. No  abnormal paraspinal soft tissue swelling. The visualized ribs are intact. IMPRESSION: Normal alignment and no acute bony findings. Electronically Signed   By: Marijo Sanes M.D.   On: 09/16/2016 07:49   US Abdomen Limited Ruq  Result Date: 09/16/2016 CLINICAL DATA:  Back pain and increasing the liver function tests. Recent ERCP with CBD stent placement. EXAM: US ABDOMEN LIMITED - RIGHT UPPER QUADRANT COMPARISON:  MRI 09/06/2016 FINDINGS: Gallbladder: Generous gallbladder distention, over 5 cm in short axis. No gallstones or wall thickening visualized. No sonographic Murphy sign noted by sonographer. Common bile duct: Visible stent within the common bile duct.  The duct measures 10 mm. Liver: No focal liver lesion is evident. There is pneumobilia, likely related to the CBD stent. IMPRESSION: 1. Visible CBD stent.  Pneumobilia noted. 2. Generous gallbladder size without mural thickening or pericholecystic fluid. The patient was not tender to probe pressure over the gallbladder. Electronically Signed   By: Andreas Newport M.D.   On: 09/16/2016 00:43     Medications:    . [MAR Hold] allopurinol  100 mg Oral Daily  . [MAR Hold] aspirin EC  81 mg Oral Daily  . [MAR Hold] atorvastatin  80 mg Oral q1800  . [MAR Hold] B-complex with vitamin C  1 tablet Oral Daily  . [MAR Hold] ciprofloxacin  500 mg Oral Q24H  . [MAR Hold] insulin aspart  0-15 Units Subcutaneous Q4H  . [MAR Hold] levETIRAcetam  1,000 mg Oral Daily  . [MAR Hold] levETIRAcetam  250 mg Oral Once per day on Tue Thu Sat  . [MAR Hold] Melatonin  5 mg Oral QHS  . [MAR Hold] mirtazapine  7.5 mg Oral QHS  . [MAR Hold] multivitamin  1 tablet Oral QHS  . [MAR Hold] pantoprazole  40 mg Oral Daily   [MAR Hold] acetaminophen **OR** [MAR Hold] acetaminophen, [MAR Hold] albuterol, [MAR Hold] bisacodyl, [MAR Hold] ipratropium, [MAR Hold] magnesium citrate, [MAR Hold] meclizine, [MAR Hold] nitroGLYCERIN, [MAR Hold] ondansetron **OR** [MAR Hold]  ondansetron (ZOFRAN) IV, [MAR Hold] oxyCODONE, [MAR Hold] senna-docusate  Assessment/ Plan:  [name] is a 66 y.o. 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   Rainbow  1. End Stage Renal Disease. Pt due for dialysis treatment today, orders have been prepared.   2.  Secondary Hyperparathyroidism: Phos 4.8 at last check, recheck phos today.   3. Anemia of chronic kidney disease:  Hemoglobin 10.1, continue to monitor.   4. Common bile duct stone with Escherichia coli bacteremia. ERCP with stent placement done on 3/27, repeat ERCP today.       LOS: 1 Praise Stennett 4/3/201812:23 PM

## 2016-09-17 NOTE — Transfer of Care (Signed)
Immediate Anesthesia Transfer of Care Note  Patient: [name]  Procedure(s) Performed: Procedure(s): ENDOSCOPIC RETROGRADE CHOLANGIOPANCREATOGRAPHY (ERCP) WITH PROPOFOL (N/A)  Patient Location: PACU  Anesthesia Type:General  Level of Consciousness: unresponsive  Airway & Oxygen Therapy: Patient Spontanous Breathing and Patient connected to nasal cannula oxygen  Post-op Assessment: Report given to RN and Post -op Vital signs reviewed and stable  Post vital signs: Reviewed and stable  Last Vitals:  Vitals:   09/17/16 1103 09/17/16 1342  BP: (!) 151/80 122/78  Pulse: 78 75  Resp: 14 18  Temp: 36.8 C 36.3 C    Last Pain:  Vitals:   09/17/16 1342  TempSrc: Tympanic  PainSc: Asleep      Patients Stated Pain Goal: 1 (41/58/30 9407)  Complications: No apparent anesthesia complications

## 2016-09-17 NOTE — Progress Notes (Signed)
Pre HD  

## 2016-09-17 NOTE — Progress Notes (Signed)
HD initiated without issue  

## 2016-09-17 NOTE — Progress Notes (Addendum)
[facility] [place] NAME: Trevin Gartrell    MR#:  [id number]  DATE OF BIRTH:  05-Dec-1950  SUBJECTIVE:  CHIEF COMPLAINT:   Chief Complaint  Patient presents with  . Back Pain   No complaint. REVIEW OF SYSTEMS:  Review of Systems  Constitutional: Negative for chills, fever and malaise/fatigue.  HENT: Negative for congestion.   Eyes: Negative for blurred vision and double vision.  Respiratory: Negative for cough, shortness of breath and stridor.   Cardiovascular: Negative for chest pain, palpitations and leg swelling.  Gastrointestinal: Negative for abdominal pain, blood in stool, diarrhea, melena, nausea and vomiting.  Genitourinary: Negative for dysuria and hematuria.  Musculoskeletal: Negative for back pain.  Neurological: Negative for dizziness, focal weakness, loss of consciousness and weakness.  Psychiatric/Behavioral: Negative for depression. The patient is not nervous/anxious.     DRUG ALLERGIES:   Allergies  Allergen Reactions  . Ivp Dye [Iodinated Diagnostic Agents] Other (See Comments)    Pt denied   VITALS:  Blood pressure (!) 174/88, pulse 73, temperature 97.4 F (36.3 C), temperature source Oral, resp. rate 16, height 4\' 9"  (1.448 m), weight 163 lb (73.9 kg), SpO2 100 %. PHYSICAL EXAMINATION:  Physical Exam  Constitutional: He is oriented to person, place, and time and well-developed, well-nourished, and in no distress.  HENT:  Head: Normocephalic.  Mouth/Throat: Oropharynx is clear and moist.  Eyes: Conjunctivae and EOM are normal.  Neck: Normal range of motion. Neck supple. No JVD present. No tracheal deviation present. No thyromegaly present.  Cardiovascular: Normal rate and regular rhythm.  Exam reveals no gallop.   No murmur heard. Pulmonary/Chest: Effort normal and breath sounds normal. No respiratory distress. He has no wheezes. He has no rales.  Abdominal: Soft. Bowel sounds are normal. He exhibits no  distension. There is no tenderness.  Musculoskeletal: Normal range of motion. He exhibits no edema or tenderness.  Neurological: He is alert and oriented to person, place, and time. No cranial nerve deficit.  Skin: No rash noted. No erythema.  Psychiatric: Affect normal.   LABORATORY PANEL:  Male CBC  Recent Labs Lab 09/16/16 0342  WBC 8.9  HGB 10.1*  HCT 32.2*  PLT 282   ------------------------------------------------------------------------------------------------------------------ Chemistries   Recent Labs Lab 09/16/16 0342  NA 139  K 3.5  CL 95*  CO2 28  GLUCOSE 75  BUN 32*  CREATININE 9.80*  CALCIUM 7.7*  MG 2.2  AST 364*  ALT 120*  ALKPHOS 390*  BILITOT 2.8*   RADIOLOGY:  No results found. ASSESSMENT AND PLAN:   This is a 66 y.o.malewith a history of chronic diastolic congestive heart failure, end-stage renal disease on hemodialysis status post failed kidney transplant, hypertension, hyperlipidemia, anemia of chronic disease, seizure disorder, gout and type 2 diabetes.  #. Transaminitis caused by Choledocholithiasis.  recent stent placement for choledocholithiasis and cholangitis.   ERCP by Dr. Allen Norris: Choledocholithiasis was found. Complete removal was                        accomplished by biliary sphincterotomy and balloon                        extraction.                       - One stent was removed from the biliary tree.                       -  A biliary sphincterotomy was performed.                       - The biliary tree was swept. Marland Kitchen Resume heparin and Plavix tomorrow. Follow-up CMP tomorrow.  Accelerated hypertension. R Continue hypertension medication, IV hydralazine when necessary.  #. Back pain, possibly secondary to pancreatitis vs. MSK etiology pain control. Improved. - PT eval suggested home health PT.  #. Recent sepsis, pneumonia and  common bile duct stone with Escherichia coli bacteremia. - Continue Cipro  #. ESRD on  HD Continue dialysis -Continue multivitamin  #. History of chronic diastolic heart failure -Continue Coreg, nitroglycerin, losartan  #. History of coronary artery disease status post MI -Continue aspirin, Plavix, continue Coreg, nitroglycerin, Lipitor  #. History of GERD -Continue Protonix  #. History of seizures -Continue Keppra  #. Elevated troponins, mild and likely secondary to end-stage renal disease Stable troponins  #. H/o Diabetes Continue sliding scale.  I discussed with Dr. Holley Raring.  All the records are reviewed and case discussed with Care Management/Social Worker. Management plans discussed with the patient, family and they are in agreement.  CODE STATUS: Full Code  TOTAL TIME TAKING CARE OF THIS PATIENT: 33 minutes.   More than 50% of the time was spent in counseling/coordination of care: YES  POSSIBLE D/C IN 1-2 DAYS, DEPENDING ON CLINICAL CONDITION.   Demetrios Loll M.D on 09/17/2016 at 3:37 PM  Between 7am to 6pm - Pager - [phone]  After 6pm go to [email or web address] - Proofreader  [facility]  Office  [phone]  CC: Primary care physician; [name], MD  Note: This dictation was prepared with Dragon dictation along with smaller phrase technology. Any transcriptional errors that result from this process are unintentional.

## 2016-09-17 NOTE — Progress Notes (Signed)
   PT note  Pt unavailable for session this am due to procedure.  Will continue as appropriate.   Chesley Noon, PTA 09/17/16, 11:35 AM

## 2016-09-17 NOTE — Progress Notes (Signed)
HD TX ended  

## 2016-09-17 NOTE — Anesthesia Preprocedure Evaluation (Signed)
Anesthesia Evaluation  Patient identified by MRN, date of birth, ID band Patient awake    Reviewed: Allergy & Precautions, NPO status , Patient's Chart, lab work & pertinent test results, reviewed documented beta blocker date and time   Airway Mallampati: III  TM Distance: >3 FB     Dental  (+) Chipped   Pulmonary shortness of breath, pneumonia, resolved, former smoker,           Cardiovascular hypertension, Pt. on medications + Past MI, + Cardiac Stents and +CHF       Neuro/Psych Anxiety    GI/Hepatic GERD  ,  Endo/Other  diabetes, Type 1  Renal/GU ESRFRenal disease     Musculoskeletal   Abdominal   Peds  Hematology  (+) anemia ,   Anesthesia Other Findings Gout. Takes plavix. Id transplant 2004.  Reproductive/Obstetrics                             Anesthesia Physical Anesthesia Plan  ASA: IV  Anesthesia Plan: General   Post-op Pain Management:    Induction: Intravenous  Airway Management Planned:   Additional Equipment:   Intra-op Plan:   Post-operative Plan:   Informed Consent: I have reviewed the patients History and Physical, chart, labs and discussed the procedure including the risks, benefits and alternatives for the proposed anesthesia with the patient or authorized representative who has indicated his/her understanding and acceptance.     Plan Discussed with: CRNA  Anesthesia Plan Comments:         Anesthesia Quick Evaluation

## 2016-09-17 NOTE — Op Note (Signed)
[facility] Patient Name: [name] Procedure Date: 09/17/2016 12:53 PM MRN: [id number] Account #: [id number] Date of Birth: 02/25/1951 Admit Type: Outpatient Age: 66 Room: [facility] ENDO ROOM 4 Gender: Male Note Status: Finalized Procedure:            ERCP Indications:          Suspected ascending cholangitis, Jaundice, Elevated                        liver enzymes Providers:            [name] MD, MD Referring MD:         [name] (Referring MD) Medicines:            Propofol per Anesthesia Complications:        No immediate complications. Procedure:            Pre-Anesthesia Assessment:                       - Prior to the procedure, a History and Physical was                        performed, and patient medications and allergies were                        reviewed. The patient's tolerance of previous                        anesthesia was also reviewed. The risks and benefits of                        the procedure and the sedation options and risks were                        discussed with the patient. All questions were                        answered, and informed consent was obtained. Prior                        Anticoagulants: The patient has taken no previous                        anticoagulant or antiplatelet agents. ASA Grade                        Assessment: II - A patient with mild systemic disease.                        After reviewing the risks and benefits, the patient was                        deemed in satisfactory condition to undergo the                        procedure.                       After obtaining informed consent, the scope was passed  under direct vision. Throughout the procedure, the                        patient's blood pressure, pulse, and oxygen saturations                        were monitored continuously. The Endoscope was                        introduced through the  mouth, and used to inject                        contrast into and used to inject contrast into the bile                        duct. The ERCP was accomplished without difficulty. The                        patient tolerated the procedure well. Findings:      A biliary stent was visible on the scout film. The esophagus was       successfully intubated under direct vision. The scope was advanced to a       normal major papilla in the descending duodenum without detailed       examination of the pharynx, larynx and associated structures, and upper       GI tract. The upper GI tract was grossly normal. The bile duct was       deeply cannulated with the short-nosed traction sphincterotome. Contrast       was injected. I personally interpreted the bile duct images. There was       brisk flow of contrast through the ducts. Image quality was excellent.       Contrast extended to the hepatic ducts. The lower third of the main bile       duct contained three stones mm. One stent was removed from the biliary       tree using a snare. The biliary sphincterotomy was extended with a       traction (standard) sphincterotome using ERBE electrocautery. There was       no post-sphincterotomy bleeding. The biliary tree was swept with a 15 mm       balloon starting at the bifurcation. Three stones were removed. No       stones remained. Impression:           - Choledocholithiasis was found. Complete removal was                        accomplished by biliary sphincterotomy and balloon                        extraction.                       - One stent was removed from the biliary tree.                       - A biliary sphincterotomy was performed.                       - The biliary tree was swept. Recommendation:       - Return  patient to hospital ward for ongoing care. Procedure Code(s):    --- Professional ---                       [phone], Endoscopic retrograde cholangiopancreatography                         (ERCP); with removal of foreign body(s) or stent(s)                        from biliary/pancreatic duct(s)                       43264, Endoscopic retrograde cholangiopancreatography                        (ERCP); with removal of calculi/debris from                        biliary/pancreatic duct(s)                       43262, Endoscopic retrograde cholangiopancreatography                        (ERCP); with sphincterotomy/papillotomy                       [phone], Endoscopic catheterization of the biliary ductal                        system, radiological supervision and interpretation Diagnosis Code(s):    --- Professional ---                       R17, Unspecified jaundice                       R74.8, Abnormal levels of other serum enzymes                       Z46.59, Encounter for fitting and adjustment of other                        gastrointestinal appliance and device                       K80.50, Calculus of bile duct without cholangitis or                        cholecystitis without obstruction CPT copyright 2016 [employer]. All rights reserved. The codes documented in this report are preliminary and upon coder review may  be revised to meet current compliance requirements. [name] MD, MD 09/17/2016 1:37:03 PM This report has been signed electronically. Number of Addenda: 0 Note Initiated On: 09/17/2016 12:53 PM      [facility]

## 2016-09-17 NOTE — Anesthesia Post-op Follow-up Note (Cosign Needed)
Anesthesia QCDR form completed.        

## 2016-09-17 NOTE — Progress Notes (Signed)
Pre hd assessment. Patient currently has no complaints.

## 2016-09-18 ENCOUNTER — Encounter: Payer: Self-pay | Admitting: Gastroenterology

## 2016-09-18 LAB — COMPREHENSIVE METABOLIC PANEL
ALK PHOS: 637 U/L — AB (ref 38–126)
ALT: 106 U/L — ABNORMAL HIGH (ref 17–63)
ANION GAP: 14 (ref 5–15)
AST: 213 U/L — ABNORMAL HIGH (ref 15–41)
Albumin: 3 g/dL — ABNORMAL LOW (ref 3.5–5.0)
BILIRUBIN TOTAL: 2.1 mg/dL — AB (ref 0.3–1.2)
BUN: 24 mg/dL — ABNORMAL HIGH (ref 6–20)
CALCIUM: 8.1 mg/dL — AB (ref 8.9–10.3)
CO2: 28 mmol/L (ref 22–32)
Chloride: 96 mmol/L — ABNORMAL LOW (ref 101–111)
Creatinine, Ser: 7.82 mg/dL — ABNORMAL HIGH (ref 0.61–1.24)
GFR, EST AFRICAN AMERICAN: 7 mL/min — AB (ref >60)
GFR, EST NON AFRICAN AMERICAN: 6 mL/min — AB (ref >60)
Glucose, Bld: 94 mg/dL (ref 65–99)
Potassium: 4.5 mmol/L (ref 3.5–5.1)
Sodium: 138 mmol/L (ref 135–145)
TOTAL PROTEIN: 8.1 g/dL (ref 6.5–8.1)

## 2016-09-18 LAB — GLUCOSE, CAPILLARY
Glucose-Capillary: 96 mg/dL (ref 65–99)
Glucose-Capillary: 110 mg/dL — ABNORMAL HIGH (ref 65–99)

## 2016-09-18 MED ORDER — CARVEDILOL 6.25 MG PO TABS: 6.2500 mg | ORAL_TABLET | Freq: Two times a day (BID) | ORAL | 2 refills | Status: DC

## 2016-09-18 MED ORDER — LOSARTAN POTASSIUM 25 MG PO TABS: 25.0000 mg | ORAL_TABLET | Freq: Every day | ORAL | 2 refills | Status: DC

## 2016-09-18 NOTE — Discharge Summary (Signed)
[facility] [place] NAME: [name]    MR#:  [id number]  DATE OF BIRTH:  06-Nov-1950  DATE OF ADMISSION:  09/15/2016   ADMITTING PHYSICIAN: Harvie Bridge, DO  DATE OF DISCHARGE: 09/18/2016  2:03 PM  PRIMARY CARE PHYSICIAN: Singh,Jasmine, MD   ADMISSION DIAGNOSIS:  Back pain [M54.9] Elevated LFTs [R79.89] DISCHARGE DIAGNOSIS:  Active Problems:   Elevated LFTs   Calculus of common duct   Fitting and adjustment of gastrointestinal appliance and device  SECONDARY DIAGNOSIS:   Past Medical History:  Diagnosis Date  . Chronic diastolic congestive heart failure ([place])   . Chronic disease anemia   . ESRD (end stage renal disease) on dialysis ([place])    "[facility]; [name]" (09/29/2014)  . GERD (gastroesophageal reflux disease)   . Gout   . High cholesterol   . History of blood transfusion    "related to anemia"  . History of stomach ulcers   . Hypertension   . Type II diabetes mellitus [facility])    HOSPITAL COURSE:   This is a 66 y.o.malewith a history of chronic diastolic congestive heart failure, end-stage renal disease on hemodialysis status post failed kidney transplant, hypertension, hyperlipidemia, anemia of chronic disease, seizure disorder, gout and type 2 diabetes.  #. Transaminitis caused by Choledocholithiasis.recent stent placement for choledocholithiasis and cholangitis.   ERCP by Dr. Allen Norris: Choledocholithiasis was found. Complete removal was  accomplished by biliary sphincterotomy and balloon  extraction. - One stent was removed from the biliary tree. - A biliary sphincterotomy was performed. - The biliary tree was swept. Marland Kitchen Resumed heparin and Plavix tomorrow. CMP is improving.  Accelerated hypertension. Blood pressure is controlled today. Continue hypertension medication, IV hydralazine when  necessary.  #. Back pain, possibly secondary to pancreatitis vs. MSK etiology pain control. Improved. - PT evalsuggested home health PT.  #. Recentsepsis, pneumonia and common bile duct stone with Escherichia coli bacteremia. - Continue Cipro  #. ESRD on HD Continue dialysis -Continue multivitamin  #. History of chronic diastolic heart failure -Continue Coreg, nitroglycerin, losartan  #. History of coronary artery disease status post MI -Continue aspirin, Plavix, continue Coreg, nitroglycerin, Lipitor  #. History of GERD -Continue Protonix  #. History of seizures -Continue Keppra  #. Elevated troponins, mild and likely secondary to end-stage renal disease Stable troponins  #. H/o Diabetes Continue sliding scale.  DISCHARGE CONDITIONS:  Stable, discharge to home with home health and PT today. CONSULTS OBTAINED:  Treatment Team:  [name], MD Anthonette Legato, MD DRUG ALLERGIES:   Allergies  Allergen Reactions  . Ivp Dye [Iodinated Diagnostic Agents] Other (See Comments)    Pt denied   DISCHARGE MEDICATIONS:   Allergies as of 09/18/2016      Reactions   Ivp Dye [iodinated Diagnostic Agents] Other (See Comments)   Pt denied      Medication List    TAKE these medications   acetaminophen 325 MG tablet Commonly known as:  TYLENOL Take 2 tablets (650 mg total) by mouth every 6 (six) hours as needed for mild pain (or Fever >/= 101).   albuterol (2.5 MG/3ML) 0.083% nebulizer solution Commonly known as:  PROVENTIL Take 2.5 mg by nebulization every 6 (six) hours as needed for wheezing or shortness of breath.   allopurinol 100 MG tablet Commonly known as:  ZYLOPRIM Take 100 mg by mouth daily.   aspirin 81 MG EC tablet Take 81 mg by mouth daily.   atorvastatin 40 MG tablet  Commonly known as:  LIPITOR Take 2 tablets (80 mg total) by mouth daily at 6 PM.   B-complex with vitamin C tablet Take 1 tablet by mouth daily.   carvedilol 6.25 MG  tablet Commonly known as:  COREG Take 1 tablet (6.25 mg total) by mouth 2 (two) times daily with a meal.   ciprofloxacin 500 MG tablet Commonly known as:  CIPRO Take 1 tablet (500 mg total) by mouth daily.   clopidogrel 75 MG tablet Commonly known as:  PLAVIX Take 1 tablet (75 mg total) by mouth daily with breakfast.   epoetin alfa 4000 UNIT/ML injection Commonly known as:  EPOGEN,PROCRIT Inject 4,000 Units into the vein.   heparin 100-0.45 UNIT/ML-% infusion Inject 1,000 Units/hr into the vein continuous.   levETIRAcetam 1000 MG tablet Commonly known as:  KEPPRA Take 1 tablet (1,000 mg total) by mouth daily.   levETIRAcetam 250 MG tablet Commonly known as:  KEPPRA Take on the Days on Dialysis (Tues, Thurs, Sat) after Dialysis.   losartan 25 MG tablet Commonly known as:  COZAAR Take 1 tablet (25 mg total) by mouth daily.   meclizine 12.5 MG tablet Commonly known as:  ANTIVERT Take 12.5 mg by mouth 3 (three) times daily as needed.   Melatonin 3 MG Tabs Take 3 mg by mouth at bedtime.   mirtazapine 7.5 MG tablet Commonly known as:  REMERON Take 7.5 mg by mouth at bedtime.   multivitamin Tabs tablet Take 1 tablet by mouth at bedtime.   nitroGLYCERIN 0.4 MG SL tablet Commonly known as:  NITROSTAT Place 1 tablet (0.4 mg total) under the tongue every 5 (five) minutes x 3 doses as needed for chest pain.   pantoprazole 40 MG tablet Commonly known as:  PROTONIX Take 40 mg by mouth daily.        DISCHARGE INSTRUCTIONS:  See AVS.  If you experience worsening of your admission symptoms, develop shortness of breath, life threatening emergency, suicidal or homicidal thoughts you must seek medical attention immediately by calling 911 or calling your MD immediately  if symptoms less severe.  You Must read complete instructions/literature along with all the possible adverse reactions/side effects for all the Medicines you take and that have been prescribed to you. Take any  new Medicines after you have completely understood and accpet all the possible adverse reactions/side effects.   Please note  You were cared for by a hospitalist during your hospital stay. If you have any questions about your discharge medications or the care you received while you were in the hospital after you are discharged, you can call the unit and asked to speak with the hospitalist on call if the hospitalist that took care of you is not available. Once you are discharged, your primary care physician will handle any further medical issues. Please note that NO REFILLS for any discharge medications will be authorized once you are discharged, as it is imperative that you return to your primary care physician (or establish a relationship with a primary care physician if you do not have one) for your aftercare needs so that they can reassess your need for medications and monitor your lab values.    On the day of Discharge:  VITAL SIGNS:  Blood pressure 137/64, pulse 73, temperature 97.9 F (36.6 C), temperature source Oral, resp. rate 16, height 4\' 9"  (1.448 m), weight 163 lb (73.9 kg), SpO2 99 %. PHYSICAL EXAMINATION:  GENERAL:  66 y.o.-year-old patient lying in the bed with no acute distress.  EYES: Pupils equal, round, reactive to light and accommodation. No scleral icterus. Extraocular muscles intact.  HEENT: Head atraumatic, normocephalic. Oropharynx and nasopharynx clear.  NECK:  Supple, no jugular venous distention. No thyroid enlargement, no tenderness.  LUNGS: Normal breath sounds bilaterally, no wheezing, rales,rhonchi or crepitation. No use of accessory muscles of respiration.  CARDIOVASCULAR: S1, S2 normal. No murmurs, rubs, or gallops.  ABDOMEN: Soft, non-tender, non-distended. Bowel sounds present. No organomegaly or mass.  EXTREMITIES: No pedal edema, cyanosis, or clubbing.  NEUROLOGIC: Cranial nerves II through XII are intact. Muscle strength 5/5 in all extremities. Sensation  intact. Gait not checked.  PSYCHIATRIC: The patient is alert and oriented x 3.  SKIN: No obvious rash, lesion, or ulcer.  DATA REVIEW:   CBC  Recent Labs Lab 09/16/16 0342  WBC 8.9  HGB 10.1*  HCT 32.2*  PLT 282    Chemistries   Recent Labs Lab 09/16/16 0342 09/18/16 0522  NA 139 138  K 3.5 4.5  CL 95* 96*  CO2 28 28  GLUCOSE 75 94  BUN 32* 24*  CREATININE 9.80* 7.82*  CALCIUM 7.7* 8.1*  MG 2.2  --   AST 364* 213*  ALT 120* 106*  ALKPHOS 390* 637*  BILITOT 2.8* 2.1*     Microbiology Results  Results for orders placed or performed during the hospital encounter of 09/04/16  Blood culture (routine x 2)     Status: Abnormal   Collection Time: 09/04/16  9:01 PM  Result Value Ref Range Status   Specimen Description BLOOD RIGHT FOREARM  Final   Special Requests BOTTLES DRAWN AEROBIC AND ANAEROBIC  BCHV  Final   Culture  Setup Time   Final    GRAM NEGATIVE RODS IN BOTH AEROBIC AND ANAEROBIC BOTTLES CRITICAL RESULT CALLED TO, READ BACK BY AND VERIFIED WITH: HANK ZOMPA 09/05/16 1128 SGD    Culture (A)  Final    ESCHERICHIA COLI SUSCEPTIBILITIES PERFORMED ON PREVIOUS CULTURE WITHIN THE LAST 5 DAYS. Performed at [facility], [place] [address], [place], [place] [address]    Report Status 09/07/2016 FINAL  Final  Blood culture (routine x 2)     Status: Abnormal   Collection Time: 09/04/16  9:01 PM  Result Value Ref Range Status   Specimen Description BLOOD RIGHT ANTECUBITAL  Final   Special Requests BOTTLES DRAWN AEROBIC AND ANAEROBIC  BCHV  Final   Culture  Setup Time   Final    GRAM NEGATIVE RODS IN BOTH AEROBIC AND ANAEROBIC BOTTLES CRITICAL RESULT CALLED TO, READ BACK BY AND VERIFIED WITH: HANK ZOMPA 09/05/16 1128 SGD Performed at [facility], [place] [address] [place], [place] [address]    Culture ESCHERICHIA COLI (A)  Final   Report Status 09/07/2016 FINAL  Final   Organism ID, Bacteria ESCHERICHIA COLI  Final      Susceptibility   Escherichia  coli - MIC*    AMPICILLIN <=2 SENSITIVE Sensitive     CEFAZOLIN <=4 SENSITIVE Sensitive     CEFEPIME <=1 SENSITIVE Sensitive     CEFTAZIDIME <=1 SENSITIVE Sensitive     CEFTRIAXONE <=1 SENSITIVE Sensitive     CIPROFLOXACIN <=0.25 SENSITIVE Sensitive     GENTAMICIN <=1 SENSITIVE Sensitive     IMIPENEM <=0.25 SENSITIVE Sensitive     TRIMETH/SULFA <=20 SENSITIVE Sensitive     AMPICILLIN/SULBACTAM <=2 SENSITIVE Sensitive     PIP/TAZO <=4 SENSITIVE Sensitive     Extended ESBL NEGATIVE Sensitive     * ESCHERICHIA COLI  Blood Culture ID Panel (Reflexed)     Status: Abnormal   Collection Time: 09/04/16  9:01 PM  Result Value Ref Range Status   Enterococcus species NOT DETECTED NOT DETECTED Final   Listeria monocytogenes NOT DETECTED NOT DETECTED Final   Staphylococcus species NOT DETECTED NOT DETECTED Final   Staphylococcus aureus NOT DETECTED NOT DETECTED Final   Streptococcus species NOT DETECTED NOT DETECTED Final   Streptococcus agalactiae NOT DETECTED NOT DETECTED Final   Streptococcus pneumoniae NOT DETECTED NOT DETECTED Final   Streptococcus pyogenes NOT DETECTED NOT DETECTED Final   Acinetobacter baumannii NOT DETECTED NOT DETECTED Final   Enterobacteriaceae species DETECTED (A) NOT DETECTED Final    Comment: Enterobacteriaceae represent a large family of gram-negative bacteria, not a single organism. CRITICAL RESULT CALLED TO, READ BACK BY AND VERIFIED WITH: HANK ZOMPA 09/05/16 1128 SGD    Enterobacter cloacae complex NOT DETECTED NOT DETECTED Final   Escherichia coli DETECTED (A) NOT DETECTED Final    Comment: CRITICAL RESULT CALLED TO, READ BACK BY AND VERIFIED WITH: HANK ZOMPA 09/05/16 1128 SGD    Klebsiella oxytoca NOT DETECTED NOT DETECTED Final   Klebsiella pneumoniae NOT DETECTED NOT DETECTED Final   Proteus species NOT DETECTED NOT DETECTED Final   Serratia marcescens NOT DETECTED NOT DETECTED Final   Carbapenem resistance NOT DETECTED NOT DETECTED Final   Haemophilus  influenzae NOT DETECTED NOT DETECTED Final   Neisseria meningitidis NOT DETECTED NOT DETECTED Final   Pseudomonas aeruginosa NOT DETECTED NOT DETECTED Final   Candida albicans NOT DETECTED NOT DETECTED Final   Candida glabrata NOT DETECTED NOT DETECTED Final   Candida krusei NOT DETECTED NOT DETECTED Final   Candida parapsilosis NOT DETECTED NOT DETECTED Final   Candida tropicalis NOT DETECTED NOT DETECTED Final  MRSA PCR Screening     Status: None   Collection Time: 09/04/16 11:14 PM  Result Value Ref Range Status   MRSA by PCR NEGATIVE NEGATIVE Final    Comment:        The GeneXpert MRSA Assay (FDA approved for NASAL specimens only), is one component of a comprehensive MRSA colonization surveillance program. It is not intended to diagnose MRSA infection nor to guide or monitor treatment for MRSA infections.     RADIOLOGY:  No results found.   Management plans discussed with the patient, family and they are in agreement.  CODE STATUS: Prior   TOTAL TIME TAKING CARE OF THIS PATIENT: 32 minutes.    Demetrios Loll M.D on 09/18/2016 at 5:33 PM  Between 7am to 6pm - Pager - [phone]  After 6pm go to [email or web address] - Proofreader  [facility]  Office  [phone]  CC: Primary care physician; [name], MD   Note: This dictation was prepared with Dragon dictation along with smaller phrase technology. Any transcriptional errors that result from this process are unintentional.

## 2016-09-18 NOTE — Care Management (Signed)
Patient discharging home today. Patient lives at home with wife.  [facility] liaison notified of discharge.  Home health orders placed for RN and PT.  Patient agreeable to home health services.  Patient provided home health agency preference.  Patient states that he does not have a preference.  Referral made to Sarah with [facility].  Patient states that he has a walker and cane in the home for ambulation. RNCM signing off.

## 2016-09-18 NOTE — Care Management Important Message (Signed)
Important Message  Patient Details  Name: [name] MRN: [id number] Date of Birth: June 29, 1950   Medicare Important Message Given:  N/A - LOS <3 / Initial given by admissions    Beverly Sessions, RN 09/18/2016, 10:34 AM

## 2016-09-18 NOTE — Discharge Instructions (Signed)
HHPT. Renal diet.

## 2016-09-18 NOTE — Progress Notes (Signed)
Discharged patient home with his wife, reviewed discharge summary with patient including appoints, accessing my chart and medications, discontinued IV access.

## 2016-09-19 NOTE — Anesthesia Postprocedure Evaluation (Signed)
Anesthesia Post Note  Patient: [name]  Procedure(s) Performed: Procedure(s) (LRB): ENDOSCOPIC RETROGRADE CHOLANGIOPANCREATOGRAPHY (ERCP) WITH PROPOFOL (N/A)  Patient location during evaluation: Endoscopy Anesthesia Type: General Level of consciousness: awake and alert Pain management: pain level controlled Vital Signs Assessment: post-procedure vital signs reviewed and stable Respiratory status: spontaneous breathing, nonlabored ventilation, respiratory function stable and patient connected to nasal cannula oxygen Cardiovascular status: blood pressure returned to baseline and stable Postop Assessment: no signs of nausea or vomiting Anesthetic complications: no     Last Vitals:  Vitals:   09/18/16 0445 09/18/16 0828  BP: 133/67 137/64  Pulse: 69 73  Resp: 16   Temp: 36.6 C 36.6 C    Last Pain:  Vitals:   09/18/16 0828  TempSrc: Oral  PainSc:                  Martha Clan

## 2016-09-26 ENCOUNTER — Ambulatory Visit
Admission: RE | Admit: 2016-09-26 | Discharge: 2016-09-26 | Disposition: A | Discharge status: Home / Self Care | Payer: Medicare Other | Source: Ambulatory Visit | Attending: Nephrology | Admitting: Nephrology

## 2016-09-26 DIAGNOSIS — D649 Anemia, unspecified: Secondary | ICD-10-CM | POA: Insufficient documentation

## 2016-09-26 LAB — PREPARE RBC (CROSSMATCH)

## 2016-09-26 LAB — ABO/RH: ABO/RH(D): O POS

## 2016-09-26 MED ORDER — SODIUM CHLORIDE 0.9 % IV SOLN: Freq: Once | INTRAVENOUS | Status: DC

## 2016-09-27 LAB — TYPE AND SCREEN
ABO/RH(D): O POS
Antibody Screen: NEGATIVE
UNIT DIVISION: 0

## 2016-09-27 LAB — BPAM RBC
BLOOD PRODUCT EXPIRATION DATE: 201805042359
ISSUE DATE / TIME: 201804121334
UNIT TYPE AND RH: 5100

## 2016-10-01 ENCOUNTER — Ambulatory Visit: Payer: Medicare Other | Admitting: Gastroenterology

## 2016-10-23 ENCOUNTER — Ambulatory Visit (INDEPENDENT_AMBULATORY_CARE_PROVIDER_SITE_OTHER): Payer: Medicare Other | Admitting: Gastroenterology

## 2016-10-23 ENCOUNTER — Other Ambulatory Visit: Payer: Self-pay

## 2016-10-23 ENCOUNTER — Encounter: Payer: Self-pay | Admitting: Gastroenterology

## 2016-10-23 VITALS — BP 170/89 | HR 78 | Temp 98.2°F | Ht 60.0 in | Wt 142.0 lb

## 2016-10-23 DIAGNOSIS — K8050 Calculus of bile duct without cholangitis or cholecystitis without obstruction: Secondary | ICD-10-CM

## 2016-10-23 NOTE — Progress Notes (Signed)
Primary Care Physician: [name], MD  Primary Gastroenterologist:  Dr. [name]  No chief complaint on file.   HPI: [name] is a 66 y.o. male here for follow-up after being discharged from the hospital. The patient had a common bile duct stone with a stent placed. The patient then had a resulting cholangitis with the stent removed and the stone removed. The original ERCP was not done for stone extraction because he was on anticoagulation. The patient did well during his hospital stay after having the stone removed in his stent removed.  Current Outpatient Prescriptions  Medication Sig Dispense Refill  . acetaminophen (TYLENOL) 325 MG tablet Take 2 tablets (650 mg total) by mouth every 6 (six) hours as needed for mild pain (or Fever >/= 101). 30 tablet 0  . albuterol (PROVENTIL) (2.5 MG/3ML) 0.083% nebulizer solution Take 2.5 mg by nebulization every 6 (six) hours as needed for wheezing or shortness of breath.    . allopurinol (ZYLOPRIM) 100 MG tablet Take 100 mg by mouth daily.  10  . aspirin 81 MG EC tablet Take 81 mg by mouth daily.  10  . atorvastatin (LIPITOR) 40 MG tablet Take 2 tablets (80 mg total) by mouth daily at 6 PM. 30 tablet 0  . B Complex-C (B-COMPLEX WITH VITAMIN C) tablet Take 1 tablet by mouth daily.    . carvedilol (COREG) 6.25 MG tablet Take 1 tablet (6.25 mg total) by mouth 2 (two) times daily with a meal. (Patient not taking: Reported on 09/26/2016) 60 tablet 2  . clopidogrel (PLAVIX) 75 MG tablet Take 1 tablet (75 mg total) by mouth daily with breakfast. (Patient not taking: Reported on 09/26/2016) 30 tablet 11  . epoetin alfa (EPOGEN,PROCRIT) 4000 UNIT/ML injection Inject 4,000 Units into the vein.    . heparin 100-0.45 UNIT/ML-% infusion Inject 1,000 Units/hr into the vein continuous. 250 mL 0  . levETIRAcetam (KEPPRA) 1000 MG tablet Take 1 tablet (1,000 mg total) by mouth daily. 60 tablet 1  . levETIRAcetam (KEPPRA) 250 MG tablet Take on the Days on  Dialysis (Tues, Thurs, Sat) after Dialysis. 60 tablet 1  . losartan (COZAAR) 25 MG tablet Take 1 tablet (25 mg total) by mouth daily. 30 tablet 2  . meclizine (ANTIVERT) 12.5 MG tablet Take 12.5 mg by mouth 3 (three) times daily as needed.    . Melatonin 3 MG TABS Take 3 mg by mouth at bedtime.    . mirtazapine (REMERON) 7.5 MG tablet Take 7.5 mg by mouth at bedtime.    . multivitamin (RENA-VIT) TABS tablet Take 1 tablet by mouth at bedtime. (Patient not taking: Reported on 09/26/2016) 30 tablet 3  . nitroGLYCERIN (NITROSTAT) 0.4 MG SL tablet Place 1 tablet (0.4 mg total) under the tongue every 5 (five) minutes x 3 doses as needed for chest pain. (Patient not taking: Reported on 09/26/2016) 25 tablet 12  . pantoprazole (PROTONIX) 40 MG tablet Take 40 mg by mouth daily.     No current facility-administered medications for this visit.     Allergies as of 10/23/2016 - Review Complete 09/26/2016  Allergen Reaction Noted  . Ivp dye [iodinated diagnostic agents] Other (See Comments) 09/29/2014    ROS:  General: Negative for anorexia, weight loss, fever, chills, fatigue, weakness. ENT: Negative for hoarseness, difficulty swallowing , nasal congestion. CV: Negative for chest pain, angina, palpitations, dyspnea on exertion, peripheral edema.  Respiratory: Negative for dyspnea at rest, dyspnea on exertion, cough, sputum, wheezing.  GI: See history  of present illness. GU:  Negative for dysuria, hematuria, urinary incontinence, urinary frequency, nocturnal urination.  Endo: Negative for unusual weight change.    Physical Examination:   There were no vitals taken for this visit.  General: Well-nourished, well-developed in no acute distress.  Eyes: No icterus. Conjunctivae pink. Mouth: Oropharyngeal mucosa moist and pink , no lesions erythema or exudate. Lungs: Clear to auscultation bilaterally. Non-labored. Heart: Regular rate and rhythm, no murmurs rubs or gallops.  Abdomen: Bowel sounds are  normal, nontender, nondistended, no hepatosplenomegaly or masses, no abdominal bruits or hernia , no rebound or guarding.   Extremities: No lower extremity edema. No clubbing or deformities. Neuro: Alert and oriented x 3.  Grossly intact. Skin: Warm and dry, no jaundice.   Psych: Alert and cooperative, normal mood and affect.  Labs:    Imaging Studies: No results found.  Assessment and Plan:   [name] is a 66 y.o. y/o male who comes in after having an ERCP with a stent removal and stone extraction.  The patient has been doing well without any GI complaints.  The patient has been told to contact my office if any jaundice or abdominal pain, fevers or chills develop.  The patient and his family have been explained the plan and agree with it.    [name], MD. [name]   Note: This dictation was prepared with Dragon dictation along with smaller phrase technology. Any transcriptional errors that result from this process are unintentional.

## 2017-03-11 IMAGING — MR MRA CHEST WITH OR WITHOUT CONTRAST
5 series · 16 of 16 positions shown · IV contrast (agent unspecified)
Comparison: None.

CLINICAL DATA: Chest and back pain.

EXAM:
MRA CHEST WITH OR WITHOUT CONTRAST
TECHNIQUE: Angiographic images of the chest were obtained using MRA technique
without intravenous contrast.
CONTRAST:  No contrast was administered.

[Series 4: haste ax double · axial · 6.0mm · 1.33mm/px · z∈[-138,+150]mm · 2 of 25 slices shown]
[im 1/25]
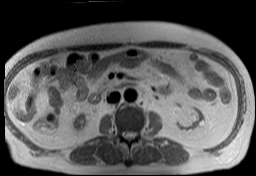
[im 25/25]
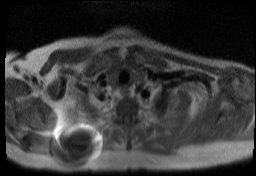

[Series 5: (candy cane) sstrue · sagittal · 6.0mm · 1.77mm/px · 2 of 17 slices shown]
[im 1/17]
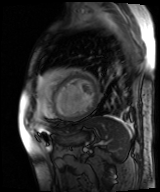
[im 17/17]
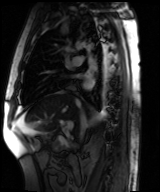

[Series 6: haste cor double · coronal · 4.0mm · 1.33mm/px · 2 of 21 slices shown]
[im 1/21]
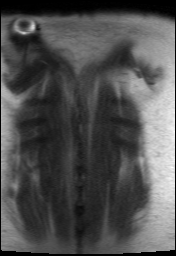
[im 21/21]
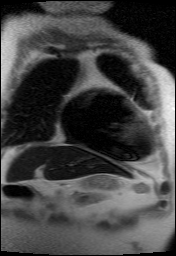

[Series 7: ax sstrue fisp · axial · 6.0mm · 1.35mm/px · z∈[-140,+160]mm · 5 of 41 slices shown]
[im 1/41]
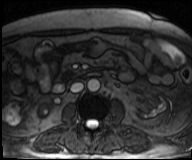
[im 11/41]
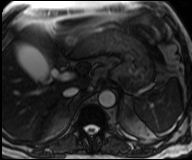
[im 21/41]
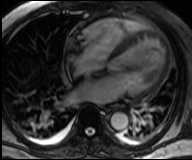
[im 31/41]
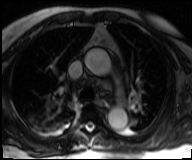
[im 41/41]
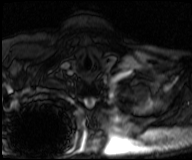

[Series 9: T2 · axial · 5.0mm · 1.17mm/px · z∈[-111,+139]mm · 5 of 41 slices shown]
[im 1/41]
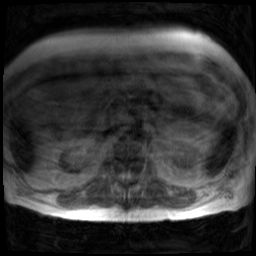
[im 11/41]
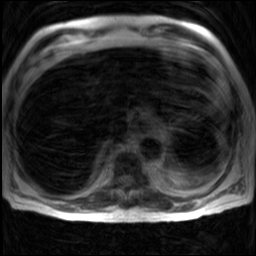
[im 21/41]
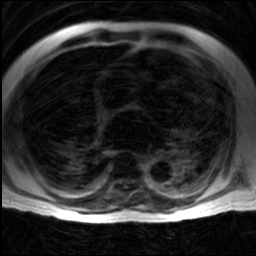
[im 31/41]
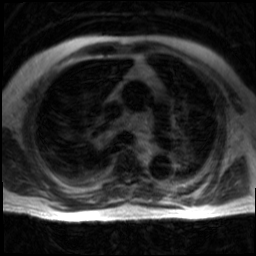
[im 41/41]
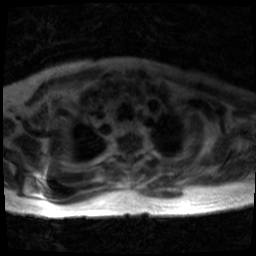

[16 of 16 positions shown; findings below may reference images not displayed]

FINDINGS: Unenhanced MRA demonstrates a normal caliber thoracic aorta without
evidence of aneurysmal disease. Maximum diameter of the ascending
thoracic aorta is 3.6 cm. Proximal great vessels show normal
branching anatomy. There is no evidence of aortic dissection or
intramural hemorrhage.

Other visualized structures in the chest and mediastinum show no
abnormalities. The heart is moderately enlarged. There is no
evidence of pleural or pericardial fluid. No incidental mass lesions
or enlarged lymph nodes are seen.
IMPRESSION: No evidence of dissection or aneurysmal disease involving the
thoracic aorta.

## 2017-03-13 IMAGING — CR DG CHEST 1V PORT
1 series · 1 of 1 positions shown · non-contrast
Comparison: None.

CLINICAL DATA: Shortness of breath.  Initial encounter.

EXAM:
PORTABLE CHEST - 1 VIEW

[AP]
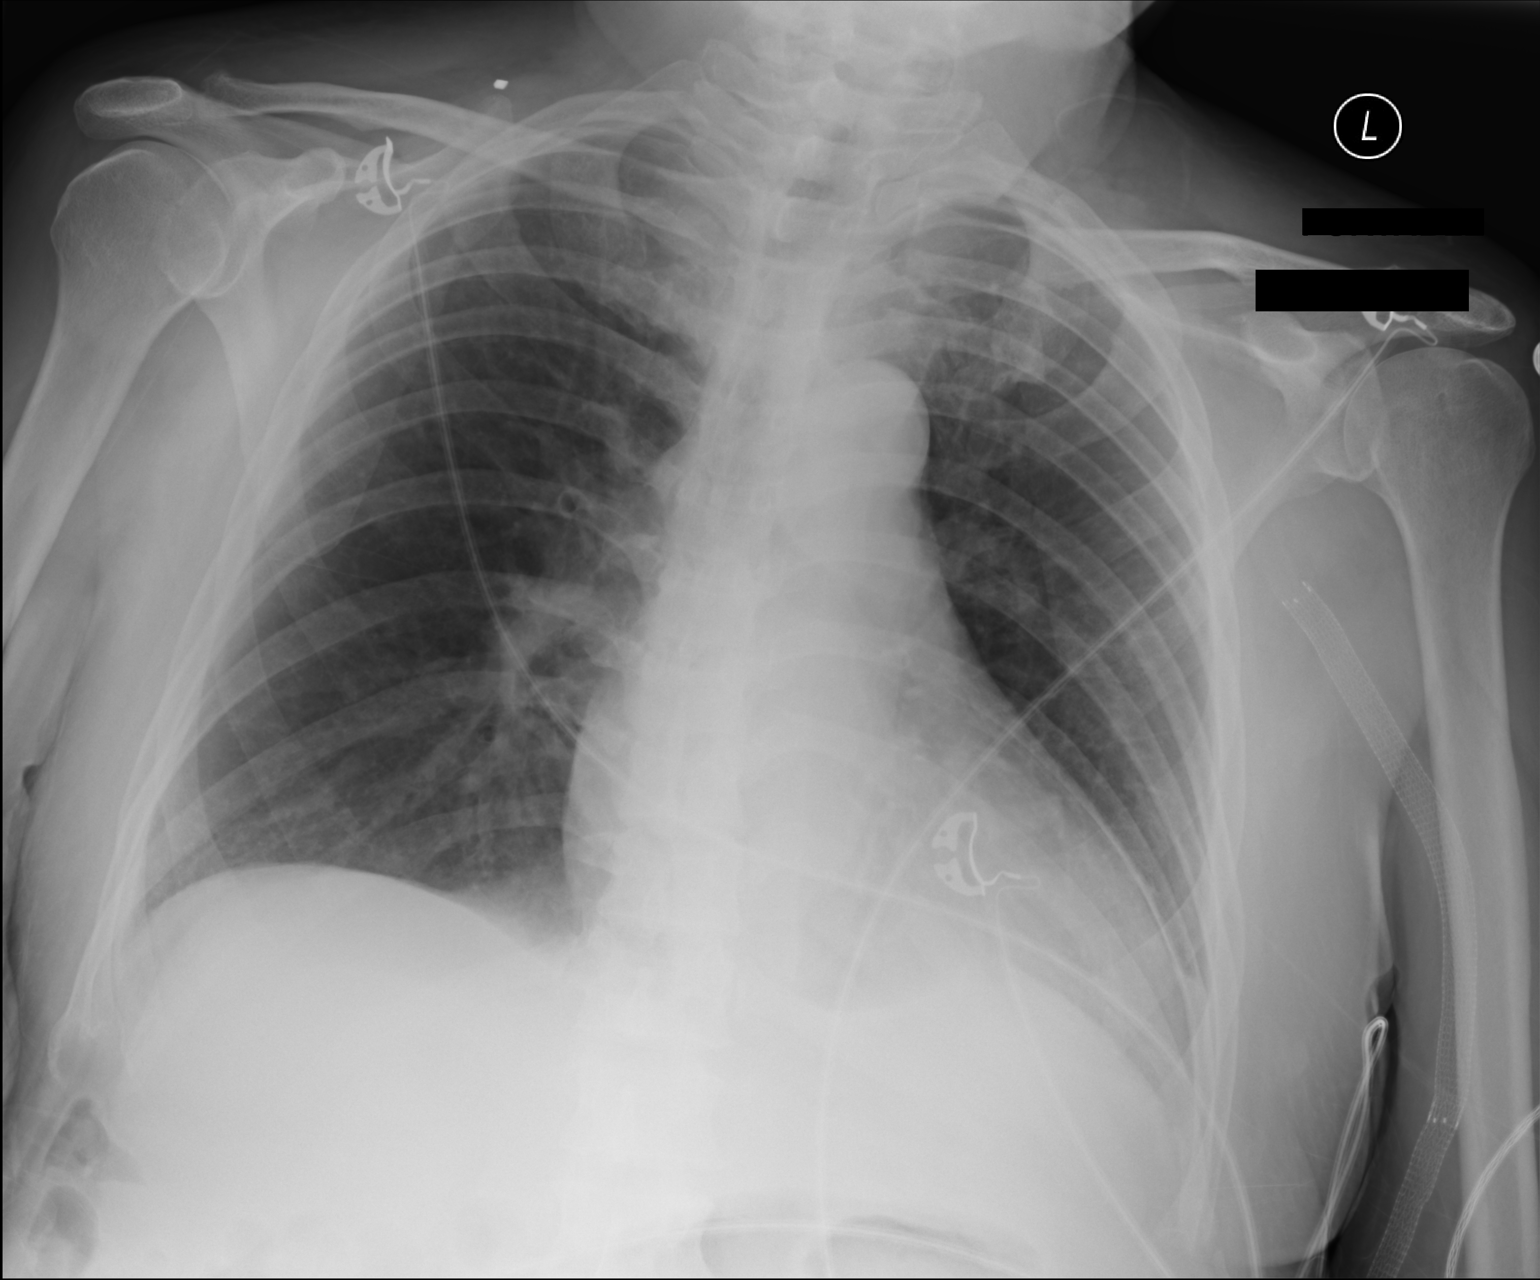

[1 of 1 positions shown; findings below may reference images not displayed]

FINDINGS: 5555 hour. The heart size and mediastinal contours are normal. There
is patchy retrocardiac opacity which may reflect atelectasis. The
right lung is clear. There is no edema, significant pleural effusion
or pneumothorax. Metallic vascular stent is noted in the left upper
arm. No acute osseous findings seen.
IMPRESSION: Patchy left lower lobe atelectasis or early infiltrate.  No edema.

## 2017-07-12 ENCOUNTER — Other Ambulatory Visit: Payer: Self-pay

## 2017-07-12 ENCOUNTER — Emergency Department: Payer: Medicare Other

## 2017-07-12 ENCOUNTER — Emergency Department
Admission: EM | Admit: 2017-07-12 | Discharge: 2017-07-12 | Disposition: A | Discharge status: Home / Self Care | Payer: Medicare Other | Attending: Emergency Medicine | Admitting: Emergency Medicine

## 2017-07-12 DIAGNOSIS — Z7902 Long term (current) use of antithrombotics/antiplatelets: Secondary | ICD-10-CM | POA: Diagnosis not present

## 2017-07-12 DIAGNOSIS — I5032 Chronic diastolic (congestive) heart failure: Secondary | ICD-10-CM | POA: Insufficient documentation

## 2017-07-12 DIAGNOSIS — Z951 Presence of aortocoronary bypass graft: Secondary | ICD-10-CM | POA: Insufficient documentation

## 2017-07-12 DIAGNOSIS — N186 End stage renal disease: Secondary | ICD-10-CM | POA: Insufficient documentation

## 2017-07-12 DIAGNOSIS — Z87891 Personal history of nicotine dependence: Secondary | ICD-10-CM | POA: Diagnosis not present

## 2017-07-12 DIAGNOSIS — I132 Hypertensive heart and chronic kidney disease with heart failure and with stage 5 chronic kidney disease, or end stage renal disease: Secondary | ICD-10-CM | POA: Insufficient documentation

## 2017-07-12 DIAGNOSIS — I953 Hypotension of hemodialysis: Secondary | ICD-10-CM | POA: Diagnosis present

## 2017-07-12 DIAGNOSIS — Z79899 Other long term (current) drug therapy: Secondary | ICD-10-CM | POA: Diagnosis not present

## 2017-07-12 DIAGNOSIS — Z7982 Long term (current) use of aspirin: Secondary | ICD-10-CM | POA: Diagnosis not present

## 2017-07-12 DIAGNOSIS — Z992 Dependence on renal dialysis: Secondary | ICD-10-CM | POA: Insufficient documentation

## 2017-07-12 DIAGNOSIS — E1122 Type 2 diabetes mellitus with diabetic chronic kidney disease: Secondary | ICD-10-CM | POA: Insufficient documentation

## 2017-07-12 DIAGNOSIS — E1022 Type 1 diabetes mellitus with diabetic chronic kidney disease: Secondary | ICD-10-CM | POA: Diagnosis not present

## 2017-07-12 LAB — CBC WITH DIFFERENTIAL/PLATELET
BASOS ABS: 0 10*3/uL (ref 0–0.1)
Basophils Relative: 0 %
EOS PCT: 5 %
Eosinophils Absolute: 0.4 10*3/uL (ref 0–0.7)
HCT: 44.8 % (ref 40.0–52.0)
Hemoglobin: 14.1 g/dL (ref 13.0–18.0)
LYMPHS ABS: 1.6 10*3/uL (ref 1.0–3.6)
LYMPHS PCT: 18 %
MCH: 24.1 pg — AB (ref 26.0–34.0)
MCHC: 31.5 g/dL — ABNORMAL LOW (ref 32.0–36.0)
MCV: 76.5 fL — AB (ref 80.0–100.0)
MONO ABS: 0.7 10*3/uL (ref 0.2–1.0)
MONOS PCT: 8 %
Neutro Abs: 6.3 10*3/uL (ref 1.4–6.5)
Neutrophils Relative %: 69 %
PLATELETS: 245 10*3/uL (ref 150–440)
RBC: 5.85 MIL/uL (ref 4.40–5.90)
RDW: 16.8 % — AB (ref 11.5–14.5)
WBC: 9.1 10*3/uL (ref 3.8–10.6)

## 2017-07-12 LAB — TROPONIN I
Troponin I: <0.03 ng/mL (ref <0.03)
Troponin I: <0.03 ng/mL (ref <0.03)

## 2017-07-12 LAB — COMPREHENSIVE METABOLIC PANEL
ALBUMIN: 4.5 g/dL (ref 3.5–5.0)
ALT: 22 U/L (ref 17–63)
AST: 28 U/L (ref 15–41)
Alkaline Phosphatase: 108 U/L (ref 38–126)
Anion gap: 12 (ref 5–15)
BILIRUBIN TOTAL: 1 mg/dL (ref 0.3–1.2)
BUN: 29 mg/dL — AB (ref 6–20)
CO2: 33 mmol/L — AB (ref 22–32)
Calcium: 9.1 mg/dL (ref 8.9–10.3)
Chloride: 93 mmol/L — ABNORMAL LOW (ref 101–111)
Creatinine, Ser: 6.61 mg/dL — ABNORMAL HIGH (ref 0.61–1.24)
GFR calc Af Amer: 9 mL/min — ABNORMAL LOW (ref >60)
GFR calc non Af Amer: 8 mL/min — ABNORMAL LOW (ref >60)
Glucose, Bld: 126 mg/dL — ABNORMAL HIGH (ref 65–99)
POTASSIUM: 3.7 mmol/L (ref 3.5–5.1)
SODIUM: 138 mmol/L (ref 135–145)
TOTAL PROTEIN: 9 g/dL — AB (ref 6.5–8.1)

## 2017-07-12 NOTE — ED Notes (Signed)
Patient arrived with access for dialysis in left forearm. Dialysis interrupted due to hypotension.

## 2017-07-12 NOTE — ED Notes (Signed)
Pt transported to dialysis unit. LFA fistula deaccessed by dialysis team, clamps left in place x5 min.

## 2017-07-12 NOTE — Discharge Instructions (Signed)
please follow-up with your regular doctor. Return for any further problems.

## 2017-07-12 NOTE — ED Triage Notes (Signed)
Hypotension at dialysis, they gave 1 ltr fluids, patient still has dialysis access in left forearm.

## 2017-07-12 NOTE — ED Notes (Signed)
Patient's discharge and follow up information reviewed with patient by ED nursing staff and patient given the opportunity to ask questions pertaining to ED visit and discharge plan of care. Patient advised that should symptoms not continue to improve, resolve entirely, or should new symptoms develop then a follow up visit with their PCP or a return visit to the ED may be warranted. Patient verbalized consent and understanding of discharge plan of care including potential need for further evaluation. Patient discharged in stable condition per attending ED physician on duty.   No complications noted to pt's fistula site at this time. Pressure dressing in place.

## 2017-07-12 NOTE — ED Notes (Signed)
[facility] sent with blood work d/t difficulty obtaining blood sample.

## 2017-07-12 NOTE — ED Provider Notes (Signed)
[facility] Emergency Department Provider Note   ____________________________________________   First MD Initiated Contact with Patient 07/12/17 1109     (approximate)  I have reviewed the triage vital signs and the nursing notes.   HISTORY  Chief Complaint Hypotension (@ dialysis, became hopotensive, gave 1 ltr fluids @ dialysis)    HPI [name] is a 67 y.o. male Who became hypotensive at dialysis he was as his noted above given a liter of fluids blood pressure came up from 70-90 and he was sent here. Patient's blood pressure is now 283 systolic he says he feels well. For some reason of dialysis access was left in his arm.   Past Medical History:  Diagnosis Date  . Chronic diastolic congestive heart failure ([place])   . Chronic disease anemia   . ESRD (end stage renal disease) on dialysis ([place])    "[place]; [place]; [name]" (09/29/2014)  . GERD (gastroesophageal reflux disease)   . Gout   . High cholesterol   . History of blood transfusion    "related to anemia"  . History of stomach ulcers   . Hypertension   . Type II diabetes mellitus [facility])     Patient Active Problem List   Diagnosis Date Noted  . Calculus of common duct   . Fitting and adjustment of gastrointestinal appliance and device   . Elevated LFTs 09/16/2016  . Calculus of bile duct without cholecystitis with obstruction   . Choledocholithiasis   . Diabetes ([place]) 09/04/2016  . HTN (hypertension) 09/04/2016  . GERD (gastroesophageal reflux disease) 09/04/2016  . Chronic diastolic CHF (congestive heart failure) ([place]) 09/04/2016  . Transaminitis 09/04/2016  . Sepsis ([place]) 09/04/2016  . CAP (community acquired pneumonia) 09/04/2016  . Encephalopathy acute   . HCAP (healthcare-associated pneumonia) 07/24/2016  . Bradycardia 06/25/2016  . Alteration in self-care ability 03/05/2016  . Hx of subdural hematoma 03/05/2016  . 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 NSTEMI (non-ST elevated  myocardial infarction) ([place]) 02/26/2016  . H/O non-ST elevation myocardial infarction (NSTEMI) 01/10/2016  . S/P coronary artery stent placement 01/10/2016  . Depression, major, single episode, complete remission ([place]) 07/22/2015  . Acute respiratory failure with hypoxia ([place]) 02/14/2015  . Syncope 11/05/2014  . Acute cystitis with hematuria 10/11/2014  . Acute subdural hematoma ([place]) 10/10/2014  . Anemia in chronic kidney disease (CKD) 10/07/2014  . ESRD on hemodialysis ([place]) 10/07/2014  . Mixed hyperlipidemia 10/07/2014  . 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 Contaminant given to patient   . Shortness of breath   . Viridans streptococci infection   . Polymicrobial bacterial infection   . Bacteremia   . 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 ESRD on dialysis ([place])   . Type 1 diabetes mellitus with other diabetic kidney complication ([place])   . Acute coronary syndrome ([place]) 09/29/2014  . Anemia associated with chronic renal failure 03/25/2014  . Nausea & vomiting 03/25/2014  . Gout 03/17/2012  . H/O: upper GI bleed 03/17/2012  . Hyperlipidemia, unspecified 03/17/2012  . Kidney transplant status, cadaveric 07/16/2002    Past Surgical History:  Procedure Laterality Date  . ARTERIOVENOUS GRAFT PLACEMENT Left ~ 1996  . CARDIAC CATHETERIZATION  09/29/2014   "[place]"  . CARDIAC CATHETERIZATION N/A 02/26/2016   Procedure: Left Heart Cath and Coronary Angiography Possible PCI;  Surgeon: [name], MD;  Location: [place] CV LAB;  Service: Cardiovascular;  Laterality: N/A;  . ENDOSCOPIC RETROGRADE CHOLANGIOPANCREATOGRAPHY (ERCP) WITH PROPOFOL N/A  09/17/2016   Procedure: ENDOSCOPIC RETROGRADE CHOLANGIOPANCREATOGRAPHY (ERCP) WITH PROPOFOL;  Surgeon: [name], MD;  Location: [facility] ENDOSCOPY;  Service: Endoscopy;  Laterality: N/A;  . ERCP N/A 09/10/2016   Procedure: ENDOSCOPIC RETROGRADE CHOLANGIOPANCREATOGRAPHY (ERCP);  Surgeon: [name],  MD;  Location: [facility] ENDOSCOPY;  Service: Endoscopy;  Laterality: N/A;  . KIDNEY TRANSPLANT Right 2004  . NEPHRECTOMY TRANSPLANTED ORGAN  2015  . PERCUTANEOUS CORONARY STENT INTERVENTION (PCI-S) N/A 09/30/2014   Procedure: PERCUTANEOUS CORONARY STENT INTERVENTION (PCI-S);  Surgeon: [name], MD;  Location: [facility] CATH LAB;  Service: Cardiovascular;  Laterality: N/A;  . PERITONEAL CATHETER INSERTION  02/2014  . PERITONEAL CATHETER REMOVAL  08/2014  . THROMBECTOMY / ARTERIOVENOUS GRAFT REVISION  2015    Prior to Admission medications   Medication Sig Start Date End Date Taking? Authorizing Provider  acetaminophen (TYLENOL) 325 MG tablet Take 2 tablets (650 mg total) by mouth every 6 (six) hours as needed for mild pain (or Fever >/= 101). 02/27/16  Yes [name], MD  allopurinol (ZYLOPRIM) 100 MG tablet Take 100 mg by mouth daily. 09/23/14  Yes [provider]  aspirin 81 MG EC tablet Take 81 mg by mouth daily. 09/23/14  Yes [provider]  atorvastatin (LIPITOR) 80 MG tablet Take 80 mg by mouth daily.  09/25/16  Yes [provider]  calcium acetate (PHOSLO) 667 MG capsule Take 1,334 mg by mouth 3 (three) times daily with meals.   Yes [provider]  clopidogrel (PLAVIX) 75 MG tablet Take 1 tablet (75 mg total) by mouth daily with breakfast. 10/02/14  Yes [name], MD  epoetin alfa (EPOGEN,PROCRIT) 4000 UNIT/ML injection Inject 4,000 Units into the vein.   Yes [provider]  heparin 100-0.45 UNIT/ML-% infusion Inject 1,000 Units/hr into the vein continuous. 02/27/16  Yes [name], MD  levETIRAcetam (KEPPRA) 1000 MG tablet Take 1 tablet (1,000 mg total) by mouth daily. 07/29/16  Yes Henreitta Leber, MD  levETIRAcetam (KEPPRA) 250 MG tablet Take on the Days on Dialysis (Tues, Thurs, Sat) after Dialysis. Patient taking differently: Take 250 mg by mouth 3 (three) times daily. Take 250 mg on Dialysis days (Tues, Thurs, Sat) after Dialysis.  07/29/16  Yes Henreitta Leber, MD  metoprolol succinate (TOPROL-XL) 25 MG 24 hr tablet Take 12.5-25 mg by mouth daily. Take 25 mg by mouth on non-dialysis days and 12.5 mg on dialysis days (Tuesday, Thursday and Saturday) after dialysis.   Yes [provider]  midodrine (PROAMATINE) 10 MG tablet Take 10 mg by mouth 3 (three) times a week. Take 10 mg by mouth 30 minutes prior to dialysis on Tuesday, Thursday and Saturday.   Yes [provider]  mirtazapine (REMERON) 7.5 MG tablet Take 7.5 mg by mouth at bedtime.   Yes [provider]  atorvastatin (LIPITOR) 40 MG tablet Take 2 tablets (80 mg total) by mouth daily at 6 PM. Patient not taking: Reported on 07/12/2017 09/14/16   Bettey Costa, MD  carvedilol (COREG) 6.25 MG tablet Take 1 tablet (6.25 mg total) by mouth 2 (two) times daily with a meal. Patient not taking: Reported on 09/26/2016 09/18/16   Demetrios Loll, MD  losartan (COZAAR) 25 MG tablet Take 1 tablet (25 mg total) by mouth daily. Patient not taking: Reported on 10/23/2016 09/18/16   Demetrios Loll, MD  multivitamin (RENA-VIT) TABS tablet Take 1 tablet by mouth at bedtime. Patient not taking: Reported on 09/26/2016 10/02/14   [name], MD  nitroGLYCERIN (NITROSTAT) 0.4 MG SL tablet  Place 1 tablet (0.4 mg total) under the tongue every 5 (five) minutes x 3 doses as needed for chest pain. Patient not taking: Reported on 09/26/2016 10/02/14   [name], MD    Allergies Ivp dye [iodinated diagnostic agents]  Family History  Problem Relation Age of Onset  . Hypertension Mother   . Stroke Mother   . Hypertension Father   . Diabetes Mellitus II Father   . Asthma Father     Social History Social History   Tobacco Use  . Smoking status: Former Smoker    Types: Cigarettes  . Smokeless tobacco: Never Used  . Tobacco comment: "quit smoking cigarettes in the 1980's"  Substance Use Topics  . Alcohol use: No  . Drug use: No    Review of Systems  Constitutional:  No fever/chills Eyes: No visual changes. ENT: No sore throat. Cardiovascular: Denies chest pain. Respiratory: Denies shortness of breath. Gastrointestinal: No abdominal pain.  No nausea, no vomiting.  No diarrhea.  No constipation. Genitourinary: Negative for dysuria. Musculoskeletal: Negative for back pain. Skin: Negative for rash. Neurological: Negative for headaches, focal weakness  ____________________________________________   PHYSICAL EXAM:  VITAL SIGNS: ED Triage Vitals  Enc Vitals Group     BP 07/12/17 1055 118/70     Pulse Rate 07/12/17 1055 72     Resp 07/12/17 1055 18     Temp 07/12/17 1055 (!) 97.5 F (36.4 C)     Temp Source 07/12/17 1055 Oral     SpO2 07/12/17 1055 100 %     Weight 07/12/17 1056 130 lb (59 kg)     Height 07/12/17 1056 5' (1.524 m)     Head Circumference --      Peak Flow --      Pain Score --      Pain Loc --      Pain Edu? --      Excl. in [place]? --     Constitutional: Alert and oriented. Well appearing and in no acute distress. Eyes: Conjunctivae are normal.  Head: Atraumatic. Nose: No congestion/rhinnorhea. Mouth/Throat: Mucous membranes are moist.  Oropharynx non-erythematous. Neck: No stridor.   Cardiovascular: Normal rate, regular rhythm. Grossly normal heart sounds.  Good peripheral circulation. Respiratory: Normal respiratory effort.  No retractions. Lungs CTAB. Gastrointestinal: Soft and nontender. No distention. No abdominal bruits. No CVA tenderness. Musculoskeletal: No lower extremity tenderness nor edema.  No joint effusions. Neurologic:  Normal speech and language. No gross focal neurologic deficits are appreciated.  Skin:  Skin is warm, dry and intact. No rash noted. Psychiatric: Mood and affect are normal. Speech and behavior are normal.  ____________________________________________   LABS (all labs ordered are listed, but only abnormal results are displayed)  Labs Reviewed  COMPREHENSIVE METABOLIC PANEL - Abnormal;  Notable for the following components:      Result Value   Chloride 93 (*)    CO2 33 (*)    Glucose, Bld 126 (*)    BUN 29 (*)    Creatinine, Ser 6.61 (*)    Total Protein 9.0 (*)    GFR calc non Af Amer 8 (*)    GFR calc Af Amer 9 (*)    All other components within normal limits  CBC WITH DIFFERENTIAL/PLATELET - Abnormal; Notable for the following components:   MCV 76.5 (*)    MCH 24.1 (*)    MCHC 31.5 (*)    RDW 16.8 (*)    All other components within normal limits  TROPONIN  I  TROPONIN I   ____________________________________________  EKG  EKG read and interpreted by me shows normal sinus rhythm rate of 74 0 axis no acute ST-T wave changes computer is reading borderline prolongedQT interval QTC C is 483 ms by the computer ____________________________________________  RADIOLOGY   ____________________________________________   PROCEDURES  Procedure(s) performed:  Procedures  Critical Care performed:  ____________________________________________   INITIAL IMPRESSION / ASSESSMENT AND PLAN / ED COURSE  dialysis reports that we 2 hours before they can send someone to deaccess the patient     ____________________________________________   FINAL CLINICAL IMPRESSION(S) / ED DIAGNOSES the patient's blood pressures remained stable his troponins are negative we'll discharge him.   Final diagnoses:  Hemodialysis-associated hypotension     ED Discharge Orders    None       Note:  This document was prepared using Dragon voice recognition software and may include unintentional dictation errors.    Nena Polio, MD 07/12/17 1444

## 2017-07-12 NOTE — ED Notes (Signed)
Per EDP Malinda pt may be discharged after 20-30 min waiting period to check for bleeding from fistula site.

## 2018-02-13 ENCOUNTER — Other Ambulatory Visit: Payer: Self-pay | Admitting: Physician Assistant

## 2018-02-13 ENCOUNTER — Ambulatory Visit
Admission: RE | Admit: 2018-02-13 | Discharge: 2018-02-13 | Disposition: A | Discharge status: Home / Self Care | Payer: Medicare Other | Source: Ambulatory Visit | Attending: Physician Assistant | Admitting: Physician Assistant

## 2018-02-13 DIAGNOSIS — R531 Weakness: Secondary | ICD-10-CM

## 2018-02-13 DIAGNOSIS — I639 Cerebral infarction, unspecified: Secondary | ICD-10-CM | POA: Insufficient documentation

## 2018-02-13 DIAGNOSIS — R410 Disorientation, unspecified: Secondary | ICD-10-CM

## 2018-02-13 DIAGNOSIS — I6782 Cerebral ischemia: Secondary | ICD-10-CM | POA: Diagnosis not present

## 2018-02-13 DIAGNOSIS — R5381 Other malaise: Secondary | ICD-10-CM

## 2018-02-13 DIAGNOSIS — R6889 Other general symptoms and signs: Secondary | ICD-10-CM

## 2018-02-13 DIAGNOSIS — G319 Degenerative disease of nervous system, unspecified: Secondary | ICD-10-CM | POA: Insufficient documentation

## 2018-02-19 ENCOUNTER — Emergency Department: Payer: Medicare Other

## 2018-02-19 ENCOUNTER — Encounter: Payer: Self-pay | Admitting: Emergency Medicine

## 2018-02-19 ENCOUNTER — Other Ambulatory Visit: Payer: Self-pay

## 2018-02-19 ENCOUNTER — Inpatient Hospital Stay: Payer: Medicare Other

## 2018-02-19 ENCOUNTER — Inpatient Hospital Stay
Admission: EM | Admit: 2018-02-19 | Discharge: 2018-02-27 | DRG: 064 | Disposition: A | Discharge status: Skilled Nursing Facility | Payer: Medicare Other | Attending: Internal Medicine | Admitting: Internal Medicine

## 2018-02-19 DIAGNOSIS — Z7902 Long term (current) use of antithrombotics/antiplatelets: Secondary | ICD-10-CM | POA: Diagnosis not present

## 2018-02-19 DIAGNOSIS — F0390 Unspecified dementia without behavioral disturbance: Secondary | ICD-10-CM | POA: Diagnosis present

## 2018-02-19 DIAGNOSIS — R531 Weakness: Secondary | ICD-10-CM | POA: Diagnosis present

## 2018-02-19 DIAGNOSIS — Z515 Encounter for palliative care: Secondary | ICD-10-CM

## 2018-02-19 DIAGNOSIS — K219 Gastro-esophageal reflux disease without esophagitis: Secondary | ICD-10-CM | POA: Diagnosis present

## 2018-02-19 DIAGNOSIS — J189 Pneumonia, unspecified organism: Secondary | ICD-10-CM | POA: Diagnosis present

## 2018-02-19 DIAGNOSIS — R4182 Altered mental status, unspecified: Secondary | ICD-10-CM

## 2018-02-19 DIAGNOSIS — N186 End stage renal disease: Secondary | ICD-10-CM | POA: Diagnosis present

## 2018-02-19 DIAGNOSIS — Z7982 Long term (current) use of aspirin: Secondary | ICD-10-CM

## 2018-02-19 DIAGNOSIS — D631 Anemia in chronic kidney disease: Secondary | ICD-10-CM | POA: Diagnosis present

## 2018-02-19 DIAGNOSIS — Z992 Dependence on renal dialysis: Secondary | ICD-10-CM | POA: Diagnosis not present

## 2018-02-19 DIAGNOSIS — I2510 Atherosclerotic heart disease of native coronary artery without angina pectoris: Secondary | ICD-10-CM | POA: Diagnosis present

## 2018-02-19 DIAGNOSIS — I639 Cerebral infarction, unspecified: Secondary | ICD-10-CM | POA: Diagnosis present

## 2018-02-19 DIAGNOSIS — R7881 Bacteremia: Secondary | ICD-10-CM | POA: Diagnosis not present

## 2018-02-19 DIAGNOSIS — G92 Toxic encephalopathy: Secondary | ICD-10-CM | POA: Diagnosis present

## 2018-02-19 DIAGNOSIS — I132 Hypertensive heart and chronic kidney disease with heart failure and with stage 5 chronic kidney disease, or end stage renal disease: Secondary | ICD-10-CM | POA: Diagnosis present

## 2018-02-19 DIAGNOSIS — Z66 Do not resuscitate: Secondary | ICD-10-CM | POA: Diagnosis not present

## 2018-02-19 DIAGNOSIS — N2581 Secondary hyperparathyroidism of renal origin: Secondary | ICD-10-CM | POA: Diagnosis present

## 2018-02-19 DIAGNOSIS — I255 Ischemic cardiomyopathy: Secondary | ICD-10-CM | POA: Diagnosis present

## 2018-02-19 DIAGNOSIS — I5042 Chronic combined systolic (congestive) and diastolic (congestive) heart failure: Secondary | ICD-10-CM | POA: Diagnosis present

## 2018-02-19 DIAGNOSIS — M109 Gout, unspecified: Secondary | ICD-10-CM | POA: Diagnosis present

## 2018-02-19 DIAGNOSIS — R1310 Dysphagia, unspecified: Secondary | ICD-10-CM | POA: Diagnosis present

## 2018-02-19 DIAGNOSIS — Z905 Acquired absence of kidney: Secondary | ICD-10-CM

## 2018-02-19 DIAGNOSIS — E1151 Type 2 diabetes mellitus with diabetic peripheral angiopathy without gangrene: Secondary | ICD-10-CM | POA: Diagnosis present

## 2018-02-19 DIAGNOSIS — E782 Mixed hyperlipidemia: Secondary | ICD-10-CM | POA: Diagnosis present

## 2018-02-19 DIAGNOSIS — I6359 Cerebral infarction due to unspecified occlusion or stenosis of other cerebral artery: Secondary | ICD-10-CM | POA: Diagnosis not present

## 2018-02-19 DIAGNOSIS — E785 Hyperlipidemia, unspecified: Secondary | ICD-10-CM | POA: Diagnosis present

## 2018-02-19 DIAGNOSIS — Z7401 Bed confinement status: Secondary | ICD-10-CM

## 2018-02-19 DIAGNOSIS — G9340 Encephalopathy, unspecified: Secondary | ICD-10-CM | POA: Diagnosis not present

## 2018-02-19 DIAGNOSIS — I252 Old myocardial infarction: Secondary | ICD-10-CM | POA: Diagnosis not present

## 2018-02-19 DIAGNOSIS — E1122 Type 2 diabetes mellitus with diabetic chronic kidney disease: Secondary | ICD-10-CM | POA: Diagnosis present

## 2018-02-19 DIAGNOSIS — R609 Edema, unspecified: Secondary | ICD-10-CM

## 2018-02-19 DIAGNOSIS — Z23 Encounter for immunization: Secondary | ICD-10-CM

## 2018-02-19 DIAGNOSIS — Z91041 Radiographic dye allergy status: Secondary | ICD-10-CM

## 2018-02-19 DIAGNOSIS — G40909 Epilepsy, unspecified, not intractable, without status epilepticus: Secondary | ICD-10-CM | POA: Diagnosis present

## 2018-02-19 DIAGNOSIS — E11649 Type 2 diabetes mellitus with hypoglycemia without coma: Secondary | ICD-10-CM | POA: Diagnosis not present

## 2018-02-19 DIAGNOSIS — Z940 Kidney transplant status: Secondary | ICD-10-CM | POA: Diagnosis not present

## 2018-02-19 DIAGNOSIS — Z951 Presence of aortocoronary bypass graft: Secondary | ICD-10-CM | POA: Diagnosis not present

## 2018-02-19 DIAGNOSIS — Z79899 Other long term (current) drug therapy: Secondary | ICD-10-CM

## 2018-02-19 LAB — COMPREHENSIVE METABOLIC PANEL
ALT: 14 U/L (ref 0–44)
AST: 23 U/L (ref 15–41)
Albumin: 4.3 g/dL (ref 3.5–5.0)
Alkaline Phosphatase: 82 U/L (ref 38–126)
Anion gap: 17 — ABNORMAL HIGH (ref 5–15)
BILIRUBIN TOTAL: 1.3 mg/dL — AB (ref 0.3–1.2)
BUN: 43 mg/dL — AB (ref 8–23)
CHLORIDE: 93 mmol/L — AB (ref 98–111)
CO2: 27 mmol/L (ref 22–32)
Calcium: 10.3 mg/dL (ref 8.9–10.3)
Creatinine, Ser: 12.89 mg/dL — ABNORMAL HIGH (ref 0.61–1.24)
GFR, EST AFRICAN AMERICAN: 4 mL/min — AB (ref >60)
GFR, EST NON AFRICAN AMERICAN: 3 mL/min — AB (ref >60)
Glucose, Bld: 161 mg/dL — ABNORMAL HIGH (ref 70–99)
Potassium: 3.8 mmol/L (ref 3.5–5.1)
Sodium: 137 mmol/L (ref 135–145)
TOTAL PROTEIN: 8.5 g/dL — AB (ref 6.5–8.1)

## 2018-02-19 LAB — CBC WITH DIFFERENTIAL/PLATELET
BASOS ABS: 0.1 10*3/uL (ref 0–0.1)
BASOS PCT: 1 %
EOS ABS: 0.7 10*3/uL (ref 0–0.7)
Eosinophils Relative: 6 %
HEMATOCRIT: 40.4 % (ref 40.0–52.0)
Hemoglobin: 13 g/dL (ref 13.0–18.0)
Lymphocytes Relative: 44 %
Lymphs Abs: 4.6 10*3/uL — ABNORMAL HIGH (ref 1.0–3.6)
MCH: 24.2 pg — ABNORMAL LOW (ref 26.0–34.0)
MCHC: 32 g/dL (ref 32.0–36.0)
MCV: 75.4 fL — ABNORMAL LOW (ref 80.0–100.0)
MONOS PCT: 9 %
Monocytes Absolute: 1 10*3/uL (ref 0.2–1.0)
NEUTROS ABS: 4.3 10*3/uL (ref 1.4–6.5)
NEUTROS PCT: 40 %
Platelets: 195 10*3/uL (ref 150–440)
RBC: 5.36 MIL/uL (ref 4.40–5.90)
RDW: 15.8 % — AB (ref 11.5–14.5)
WBC: 10.6 10*3/uL (ref 3.8–10.6)

## 2018-02-19 LAB — BLOOD GAS, ARTERIAL
Acid-Base Excess: 3.5 mmol/L — ABNORMAL HIGH (ref 0.0–2.0)
Bicarbonate: 28.5 mmol/L — ABNORMAL HIGH (ref 20.0–28.0)
FIO2: 0.28
O2 Saturation: 99 %
PCO2 ART: 44 mmHg (ref 32.0–48.0)
Patient temperature: 37
pH, Arterial: 7.42 (ref 7.350–7.450)
pO2, Arterial: 131 mmHg — ABNORMAL HIGH (ref 83.0–108.0)

## 2018-02-19 LAB — LIPASE, BLOOD: LIPASE: 62 U/L — AB (ref 11–51)

## 2018-02-19 LAB — ETHANOL

## 2018-02-19 LAB — TROPONIN I
TROPONIN I: 0.08 ng/mL — AB (ref <0.03)
TROPONIN I: 0.07 ng/mL — AB (ref <0.03)

## 2018-02-19 LAB — PROTIME-INR
INR: 0.93
PROTHROMBIN TIME: 12.4 s (ref 11.4–15.2)

## 2018-02-19 LAB — AMMONIA
AMMONIA: 20 umol/L (ref 9–35)
Ammonia: 19 umol/L (ref 9–35)

## 2018-02-19 LAB — GLUCOSE, CAPILLARY
GLUCOSE-CAPILLARY: 109 mg/dL — AB (ref 70–99)
Glucose-Capillary: 150 mg/dL — ABNORMAL HIGH (ref 70–99)

## 2018-02-19 LAB — HEMOGLOBIN A1C
HEMOGLOBIN A1C: 6.3 % — AB (ref 4.8–5.6)
MEAN PLASMA GLUCOSE: 134.11 mg/dL

## 2018-02-19 LAB — TSH: TSH: 2.272 u[IU]/mL (ref 0.350–4.500)

## 2018-02-19 LAB — LACTIC ACID, PLASMA
Lactic Acid, Venous: 3.4 mmol/L (ref 0.5–1.9)
Lactic Acid, Venous: 1.4 mmol/L (ref 0.5–1.9)

## 2018-02-19 LAB — MRSA PCR SCREENING: MRSA by PCR: NEGATIVE

## 2018-02-19 MED ORDER — ASPIRIN EC 81 MG PO TBEC
81.0000 mg | DELAYED_RELEASE_TABLET | Freq: Every day | ORAL | Status: DC
  Administered 2018-02-21 – 2018-02-27 (×7): 81 mg via ORAL
  Filled 2018-02-19 (×7): qty 1

## 2018-02-19 MED ORDER — SODIUM CHLORIDE 0.9 % IV SOLN
250.0000 mg | INTRAVENOUS | Status: DC
  Administered 2018-02-21: 250 mg via INTRAVENOUS
  Filled 2018-02-19 (×2): qty 2.5

## 2018-02-19 MED ORDER — SODIUM CHLORIDE 0.9 % IV SOLN
1.0000 g | INTRAVENOUS | Status: DC
  Administered 2018-02-20 – 2018-02-23 (×4): 1 g via INTRAVENOUS
  Filled 2018-02-19 (×3): qty 1
  Filled 2018-02-19 (×2): qty 10
  Filled 2018-02-19: qty 1

## 2018-02-19 MED ORDER — SODIUM CHLORIDE 0.9 % IV SOLN
750.0000 mg | INTRAVENOUS | Status: DC
  Administered 2018-02-19 – 2018-02-23 (×4): 750 mg via INTRAVENOUS
  Filled 2018-02-19 (×5): qty 7.5

## 2018-02-19 MED ORDER — INSULIN ASPART 100 UNIT/ML ~~LOC~~ SOLN
0.0000 [IU] | Freq: Three times a day (TID) | SUBCUTANEOUS | Status: DC
  Administered 2018-02-23: 2 [IU] via SUBCUTANEOUS
  Filled 2018-02-19 (×2): qty 1

## 2018-02-19 MED ORDER — SODIUM CHLORIDE 0.9% FLUSH: 3.0000 mL | INTRAVENOUS | Status: DC | PRN

## 2018-02-19 MED ORDER — ATORVASTATIN CALCIUM 80 MG PO TABS
80.0000 mg | ORAL_TABLET | Freq: Every day | ORAL | Status: DC
  Administered 2018-02-21 – 2018-02-26 (×5): 80 mg via ORAL
  Filled 2018-02-19 (×2): qty 1
  Filled 2018-02-19: qty 4
  Filled 2018-02-19: qty 1
  Filled 2018-02-19: qty 4
  Filled 2018-02-19: qty 1
  Filled 2018-02-19: qty 4
  Filled 2018-02-19 (×5): qty 1
  Filled 2018-02-19: qty 4

## 2018-02-19 MED ORDER — METOPROLOL SUCCINATE ER 25 MG PO TB24: 12.5000 mg | ORAL_TABLET | Freq: Every day | ORAL | Status: DC

## 2018-02-19 MED ORDER — METOPROLOL SUCCINATE ER 25 MG PO TB24
12.5000 mg | ORAL_TABLET | ORAL | Status: DC
  Administered 2018-02-21 – 2018-02-24 (×2): 12.5 mg via ORAL
  Filled 2018-02-19 (×2): qty 1

## 2018-02-19 MED ORDER — DEXTROSE-NACL 5-0.45 % IV SOLN
INTRAVENOUS | Status: DC
  Administered 2018-02-19: 20:00:00 via INTRAVENOUS

## 2018-02-19 MED ORDER — HEPARIN SODIUM (PORCINE) 1000 UNIT/ML DIALYSIS: 20.0000 [IU]/kg | INTRAMUSCULAR | Status: DC | PRN

## 2018-02-19 MED ORDER — LEVETIRACETAM 250 MG PO TABS
250.0000 mg | ORAL_TABLET | ORAL | Status: DC
  Filled 2018-02-19: qty 1

## 2018-02-19 MED ORDER — ONDANSETRON HCL 4 MG PO TABS: 4.0000 mg | ORAL_TABLET | Freq: Four times a day (QID) | ORAL | Status: DC | PRN

## 2018-02-19 MED ORDER — METOPROLOL SUCCINATE ER 25 MG PO TB24
25.0000 mg | ORAL_TABLET | ORAL | Status: DC
  Administered 2018-02-25: 25 mg via ORAL
  Filled 2018-02-19 (×2): qty 1

## 2018-02-19 MED ORDER — FAMOTIDINE IN NACL 20-0.9 MG/50ML-% IV SOLN
20.0000 mg | Freq: Two times a day (BID) | INTRAVENOUS | Status: DC
  Administered 2018-02-19 – 2018-02-20 (×2): 20 mg via INTRAVENOUS
  Filled 2018-02-19 (×2): qty 50

## 2018-02-19 MED ORDER — SODIUM CHLORIDE 0.9% FLUSH
3.0000 mL | Freq: Two times a day (BID) | INTRAVENOUS | Status: DC
  Administered 2018-02-19 – 2018-02-27 (×17): 3 mL via INTRAVENOUS

## 2018-02-19 MED ORDER — POLYETHYLENE GLYCOL 3350 17 G PO PACK: 17.0000 g | PACK | Freq: Every day | ORAL | Status: DC | PRN

## 2018-02-19 MED ORDER — RENA-VITE PO TABS
1.0000 | ORAL_TABLET | Freq: Every day | ORAL | Status: DC
  Administered 2018-02-21 – 2018-02-26 (×3): 1 via ORAL
  Filled 2018-02-19 (×4): qty 1

## 2018-02-19 MED ORDER — HYDRALAZINE HCL 20 MG/ML IJ SOLN
10.0000 mg | INTRAMUSCULAR | Status: DC | PRN
  Administered 2018-02-19 – 2018-02-23 (×4): 10 mg via INTRAVENOUS
  Filled 2018-02-19 (×5): qty 1

## 2018-02-19 MED ORDER — LEVETIRACETAM 750 MG PO TABS
750.0000 mg | ORAL_TABLET | Freq: Every day | ORAL | Status: DC
  Filled 2018-02-19: qty 1

## 2018-02-19 MED ORDER — INSULIN ASPART 100 UNIT/ML ~~LOC~~ SOLN: 0.0000 [IU] | Freq: Every day | SUBCUTANEOUS | Status: DC

## 2018-02-19 MED ORDER — CALCIUM ACETATE (PHOS BINDER) 667 MG PO CAPS
1334.0000 mg | ORAL_CAPSULE | Freq: Three times a day (TID) | ORAL | Status: DC
  Administered 2018-02-21 – 2018-02-27 (×10): 1334 mg via ORAL
  Filled 2018-02-19 (×11): qty 2

## 2018-02-19 MED ORDER — ACETAMINOPHEN 325 MG PO TABS: 650.0000 mg | ORAL_TABLET | Freq: Four times a day (QID) | ORAL | Status: DC | PRN

## 2018-02-19 MED ORDER — ONDANSETRON HCL 4 MG/2ML IJ SOLN
4.0000 mg | Freq: Four times a day (QID) | INTRAMUSCULAR | Status: DC | PRN
  Administered 2018-02-25: 4 mg via INTRAVENOUS
  Filled 2018-02-19: qty 2

## 2018-02-19 MED ORDER — CHLORHEXIDINE GLUCONATE CLOTH 2 % EX PADS
6.0000 | MEDICATED_PAD | Freq: Every day | CUTANEOUS | Status: DC
  Administered 2018-02-22 – 2018-02-27 (×3): 6 via TOPICAL
  Filled 2018-02-19: qty 6

## 2018-02-19 MED ORDER — NITROGLYCERIN 0.4 MG SL SUBL: 0.4000 mg | SUBLINGUAL_TABLET | SUBLINGUAL | Status: DC | PRN

## 2018-02-19 MED ORDER — SODIUM CHLORIDE 0.9 % IV SOLN
500.0000 mg | INTRAVENOUS | Status: DC
  Administered 2018-02-20 – 2018-02-23 (×5): 500 mg via INTRAVENOUS
  Filled 2018-02-19 (×4): qty 500

## 2018-02-19 MED ORDER — INFLUENZA VAC SPLIT HIGH-DOSE 0.5 ML IM SUSY
0.5000 mL | PREFILLED_SYRINGE | INTRAMUSCULAR | Status: AC
  Administered 2018-02-24: 0.5 mL via INTRAMUSCULAR
  Filled 2018-02-19 (×2): qty 0.5

## 2018-02-19 MED ORDER — SODIUM CHLORIDE 0.9 % IV SOLN
250.0000 mL | INTRAVENOUS | Status: DC | PRN
  Administered 2018-02-21 – 2018-02-24 (×3): 250 mL via INTRAVENOUS

## 2018-02-19 MED ORDER — LOSARTAN POTASSIUM 25 MG PO TABS
25.0000 mg | ORAL_TABLET | Freq: Every day | ORAL | Status: DC
  Administered 2018-02-21 – 2018-02-27 (×6): 25 mg via ORAL
  Filled 2018-02-19 (×7): qty 1

## 2018-02-19 MED ORDER — HEPARIN SODIUM (PORCINE) 5000 UNIT/ML IJ SOLN
5000.0000 [IU] | Freq: Three times a day (TID) | INTRAMUSCULAR | Status: DC
  Administered 2018-02-19 – 2018-02-27 (×21): 5000 [IU] via SUBCUTANEOUS
  Filled 2018-02-19 (×22): qty 1

## 2018-02-19 MED ORDER — ACETAMINOPHEN 650 MG RE SUPP: 650.0000 mg | Freq: Four times a day (QID) | RECTAL | Status: DC | PRN

## 2018-02-19 MED ORDER — CLOPIDOGREL BISULFATE 75 MG PO TABS
75.0000 mg | ORAL_TABLET | Freq: Every day | ORAL | Status: DC
  Administered 2018-02-21 – 2018-02-27 (×7): 75 mg via ORAL
  Filled 2018-02-19 (×7): qty 1

## 2018-02-19 NOTE — Procedures (Signed)
ELECTROENCEPHALOGRAM REPORT   Patient: [name]       Room #: 203A-AA EEG No. ID: [id number] Age: 67 y.o.        Sex: male Referring Physician: Salary Report Date:  02/19/2018        Interpreting Physician: Alexis Goodell  History: Duffy Dantonio is an 67 y.o. male with altered mental status  Medications:  ASA, Lipitor, Zithromax, Phoslo, Rocephin, Plavix, Pepcid, Insulin, Keppra, Cozaar, Toprol, MVI  Conditions of Recording:  This is a 21 channel routine scalp EEG performed with bipolar and monopolar montages arranged in accordance to the international 10/20 system of electrode placement. One channel was dedicated to EKG recording.  The patient is in the lethargic state.  Description:  The background activity is slow and poorly organized.  It consists of low to moderate voltage polymorphic delta activity that is diffusely distributed and continuous.  There is often noted superimposed theta activity that is is seen bilaterally and is poorly organized as well.    No epileptiform activity is noted.   The patient received tactile stimulation during the recording with no activation of the background rhythm noted.   Hyperventilation was not performed. Intermittent photic stimulation was performed but failed to illicit any change in the tracing.   IMPRESSION: This EEG is characterized by slowing which is consistent with normal drowse.  Can not rule out the possibility of slowing related to general cerebral disturbance such as a metabolic encephalopathy.  Clinical correlation recommended.  No epileptiform activity is noted.      Alexis Goodell, MD Neurology [phone] 02/19/2018, 2:32 PM

## 2018-02-19 NOTE — ED Notes (Signed)
Pt opens eyes to verbal and physical stimuli. Attempts to follow commands. Right arm gives resistance when collecting ordered bloodwork.

## 2018-02-19 NOTE — Progress Notes (Signed)
Pre HD assessment.    02/19/18 1505  Vital Signs  Temp 97.7 F (36.5 C)  Temp Source Axillary  Pulse Rate 87  Pulse Rate Source Monitor  Resp (!) 22  BP (!) 180/96  BP Location Right Arm  BP Method Automatic  Patient Position (if appropriate) Lying  Oxygen Therapy  SpO2 99 %  O2 Device Room Air  Pain Assessment  Pain Scale PAINAD  PAINAD (Pain Assessment in Advanced Dementia)  Breathing 0  Negative Vocalization 0  Facial Expression 0  Body Language 1  Consolability 0  PAINAD Score 1  Dialysis Weight  Weight 69.6 kg  Type of Weight Pre-Dialysis  Time-Out for Hemodialysis  What Procedure? HD  Pt Identifiers(min of two) First/Last Name;MRN/Account#  Correct Site? Yes  Correct Side? Yes  Correct Procedure? Yes  Consents Verified? Yes  Rad Studies Available? N/A  Safety Precautions Reviewed? Yes  Engineer, civil (consulting) Number  ([id number])  Station Number 4  UF/Alarm Test Passed  Conductivity: Meter 13.8  Conductivity: Machine  14  pH 7.6  Reverse Osmosis main  Normal Saline Lot Number [id number]  Dialyzer Lot Number [id number]  Disposable Set Lot Number [id number]  Machine Temperature 98.6 F (37 C)  Musician and Audible Yes  Blood Lines Intact and Secured Yes  Pre Treatment Patient Checks  Vascular access used during treatment Graft  Hepatitis B Surface Antigen Results Negative  Date Hepatitis B Surface Antigen Drawn 01/27/18  Hepatitis B Surface Antibody  (>10)  Date Hepatitis B Surface Antibody Drawn 03/20/17  Hemodialysis Consent Verified Yes  Hemodialysis Standing Orders Initiated Yes  ECG (Telemetry) Monitor On Yes  Prime Ordered Normal Saline  Length of  DialysisTreatment -hour(s) 3 Hour(s)  Dialyzer Elisio 17H NR  Dialysate 3K, 2.5 Ca  Dialysis Anticoagulant None  Dialysate Flow Ordered 800  Blood Flow Rate Ordered 400 mL/min  Ultrafiltration Goal 0.5 Liters  Pre Treatment Labs Phosphorus (BMP, TSH, HIVAB, )  Dialysis Blood Pressure Support  Ordered Normal Saline  Education / Care Plan  Dialysis Education Provided Yes  Documented Education in Care Plan Yes

## 2018-02-19 NOTE — ED Provider Notes (Signed)
[facility]  I accepted care from Dr. Beather Arbour ____________________________________________    LABS (pertinent positives/negatives)  I, Lisa Roca, MD have personally reviewed the lab reports noted below.  Labs Reviewed  COMPREHENSIVE METABOLIC PANEL - Abnormal; Notable for the following components:      Result Value   Chloride 93 (*)    Glucose, Bld 161 (*)    BUN 43 (*)    Creatinine, Ser 12.89 (*)    Total Protein 8.5 (*)    Total Bilirubin 1.3 (*)    GFR calc non Af Amer 3 (*)    GFR calc Af Amer 4 (*)    Anion gap 17 (*)    All other components within normal limits  TROPONIN I - Abnormal; Notable for the following components:   Troponin I 0.08 (*)    All other components within normal limits  CBC WITH DIFFERENTIAL/PLATELET - Abnormal; Notable for the following components:   MCV 75.4 (*)    MCH 24.2 (*)    RDW 15.8 (*)    Lymphs Abs 4.6 (*)    All other components within normal limits  LIPASE, BLOOD - Abnormal; Notable for the following components:   Lipase 62 (*)    All other components within normal limits  LACTIC ACID, PLASMA - Abnormal; Notable for the following components:   Lactic Acid, Venous 3.4 (*)    All other components within normal limits  GLUCOSE, CAPILLARY - Abnormal; Notable for the following components:   Glucose-Capillary 150 (*)    All other components within normal limits  BLOOD GAS, ARTERIAL - Abnormal; Notable for the following components:   pO2, Arterial 131 (*)    Bicarbonate 28.5 (*)    Acid-Base Excess 3.5 (*)    All other components within normal limits  CULTURE, BLOOD (ROUTINE X 2)  CULTURE, BLOOD (ROUTINE X 2)  ETHANOL  AMMONIA  PROTIME-INR  URINALYSIS, COMPLETE (UACMP) WITH MICROSCOPIC  URINE DRUG SCREEN, QUALITATIVE ([facility] ONLY)  LACTIC ACID, PLASMA  LEVETIRACETAM LEVEL     ____________________________________________    RADIOLOGY All xrays were viewed by me. Imaging interpreted by radiologist.  I,  Lisa Roca MD have personally reviewed the imaging report noted below.  CT head shows no acute finding for altered mental status, vascular changes. Chest x-ray interpreted as atelectasis versus infiltrate similar to prior.  Prior looks like from January 2019.  ____________________________________________   PROCEDURES  Procedure(s) performed: None  Procedures  Critical Care performed: None  ____________________________________________   INITIAL IMPRESSION / ASSESSMENT AND PLAN / ED COURSE   Pertinent labs & imaging results that were available during my care of the patient were reviewed by me and considered in my medical decision making (see chart for details).   Patient care signed out to me as worsening mental status over what sounds like about 2 weeks.  Patient had presented to his normal dialysis and because he was having decreased LOC was referred to the ED for evaluation.  Patient without any reported fevers.  He is not tachycardic.  No hypotension.  His white blood cell count is normal.  His chest x-ray indicated infiltrate versus atelectasis but similar to prior which was in January.  I suspect this is structural rather than an acute pneumonia.  He does not have any other symptoms of pneumonia.  Ammonia does not seem to be the source of his decreased level of consciousness.  His lactate is just minimally elevated.  He is showing acute on chronic renal  failure consistent with known chronic renal failure today is his dialysis today.  There is no hyperkalemia at present.  I spoke with his wife who since the decline that seems to have worsened significantly over the past 2 weeks does not feel like she is able to care for him at home in his condition with him unable to get up or cooperate.  So at this point uncertain etiology of the decline, however does not appear to be infectious or acute stroke/intracranial abnormality, significant electrolyte of normality.  I discussed with  Dr. Boyce Medici, hospitalist for admission.    CONSULTATIONS: Hospitalist for admission.    Patient / Family / Caregiver informed of clinical course, medical decision-making process, and agree with plan.   ____________________________________________   FINAL CLINICAL IMPRESSION(S) / ED DIAGNOSES  Final diagnoses:  Altered mental status, unspecified altered mental status type  Generalized weakness        Lisa Roca, MD 02/19/18 [phone]

## 2018-02-19 NOTE — Care Management (Signed)
Patient admitted from home with acute toxic metabolic encephalopathy.  Patient lives at home with wife.  Elvera Bicker dialysis liaison notified of admission.  PCP jasmine Singh.  Patient previously open with [facility] health. RNCM following

## 2018-02-19 NOTE — ED Notes (Signed)
Pt to floor with Les EDT. Upon transfer ABCs intact. NAD

## 2018-02-19 NOTE — Progress Notes (Signed)
SLP Cancellation Note  Patient Details Name: [name] MRN: [id number] DOB: Sep 16, 1950   Cancelled treatment:       Reason Eval/Treat Not Completed: Fatigue/lethargy limiting ability to participate;Patient not medically ready;Patient's level of consciousness(chart reviewed; consulted NSG re: pt's status). Spoke w/ NSG re: pt's status for BSE at this time. Per chart/MD notes, there is concern for acute toxic metabolic encephalopathy, obtunded. NSG felt the plan was for pt to receive hemodialysis soon.  Due to pt's increased risk for aspiration and choking w/ po's secondary to declined medical and Cognitive status' at this time, recommend pt continue NPO status w/ oral care for hygiene and stimulation of swallowing; aspiration precautions. ST services will f/u tomorrow w/ pt's status and appropriateness for BSE. NSG agreed.     Orinda Kenner, MS, CCC-SLP Watson,Katherine 02/19/2018, 3:10 PM

## 2018-02-19 NOTE — ED Notes (Signed)
Date and time results received: 02/19/18 7:47 AM  (use smartphrase ".now" to insert current time)  Test: troponin 0.08, lactic acid 3.4   Name of Provider Notified: Dr. Lisa Roca

## 2018-02-19 NOTE — Progress Notes (Signed)
HD tx end   02/19/18 1832  Vital Signs  Pulse Rate 85  Pulse Rate Source Monitor  Resp (!) 21  BP 130/74  BP Location Right Leg  BP Method Automatic  Patient Position (if appropriate) Lying  Oxygen Therapy  SpO2 99 %  O2 Device Room Air  During Hemodialysis Assessment  Dialysis Fluid Bolus Normal Saline  Bolus Amount (mL) 250 mL  Intra-Hemodialysis Comments Tx completed

## 2018-02-19 NOTE — ED Notes (Signed)
Admitted MD at bedside. 

## 2018-02-19 NOTE — Progress Notes (Signed)
RN called to a rapid response, will call me back as soon as he is free to finish giving report. Pt currently in MRI.    02/19/18 1420  Hand-Off documentation  Report given to (Full Name) Stark Bray  Report received from (Full Name) Barnabas Lister Dougherty,RN

## 2018-02-19 NOTE — ED Notes (Signed)
RT [name] informed of order for ABG.  CT staff, Nicole Kindred called and informed patient ready for CT scan at this time.

## 2018-02-19 NOTE — ED Notes (Signed)
Pt opens eyes to this RN. Continues to stay nonverbal. Wife at bedside.

## 2018-02-19 NOTE — ED Notes (Signed)
Report to Christina, RN

## 2018-02-19 NOTE — Progress Notes (Signed)
[facility], [place] 02/19/18  Subjective:   Patient seen at the end of HD Drop in BP for few min. UF goal reduced Net UF 200 cc Patient awake but not interactive   HEMODIALYSIS FLOWSHEET:  Blood Flow Rate (mL/min): 285 mL/min Arterial Pressure (mmHg): -130 mmHg Venous Pressure (mmHg): 170 mmHg Transmembrane Pressure (mmHg): 50 mmHg Ultrafiltration Rate (mL/min): 300 mL/min Dialysate Flow Rate (mL/min): 600 ml/min Conductivity: Machine : 13.6 Conductivity: Machine : 13.6 Dialysis Fluid Bolus: Normal Saline Bolus Amount (mL): 250 mL    Objective:  Vital signs in last 24 hours:  Temp:  [98 F (36.7 C)] 98 F (36.7 C) (09/05 0900) Pulse Rate:  [36-76] 74 (09/05 1000) Resp:  [18-23] 18 (09/05 1000) BP: (158-199)/(87-140) 192/95 (09/05 1000) SpO2:  [98 %-100 %] 99 % (09/05 1000)  Weight change:  There were no vitals filed for this visit.  Intake/Output:   No intake or output data in the 24 hours ending 02/19/18 1058   Physical Exam: General: NAD, laying in bed  HEENT Anicteric, moist mucus membraned  Neck supple  Pulm/lungs Coarse b/l  CVS/Heart No rub  Abdomen:  Soft, NT  Extremities: no edema  Neurologic: Alert, but not interactive  Skin: No rashes  Access: Left forearm AVF       Basic Metabolic Panel:  Recent Labs  Lab 02/19/18 0700  NA 137  K 3.8  CL 93*  CO2 27  GLUCOSE 161*  BUN 43*  CREATININE 12.89*  CALCIUM 10.3     CBC: Recent Labs  Lab 02/19/18 0700  WBC 10.6  NEUTROABS 4.3  HGB 13.0  HCT 40.4  MCV 75.4*  PLT 195      Lab Results  Component Value Date   HEPBSAG Negative 09/05/2016      Microbiology:  No results found for this or any previous visit (from the past 240 hour(s)).  Coagulation Studies: Recent Labs    02/19/18 0700  LABPROT 12.4  INR 0.93    Urinalysis: No results for input(s): COLORURINE, LABSPEC, PHURINE, GLUCOSEU, HGBUR, BILIRUBINUR, KETONESUR, PROTEINUR,  UROBILINOGEN, NITRITE, LEUKOCYTESUR in the last 72 hours.  Invalid input(s): APPERANCEUR    Imaging: Ct Head Wo Contrast  Result Date: 02/19/2018 CLINICAL DATA:  Altered level of consciousness EXAM: CT HEAD WITHOUT CONTRAST TECHNIQUE: Contiguous axial images were obtained from the base of the skull through the vertex without intravenous contrast. COMPARISON:  Head CT July 26, 2016; brain MRI July 26, 2016 FINDINGS: Brain: There is moderate diffuse atrophy. There is no intracranial mass, hemorrhage, extra-axial fluid collection, or midline shift. There is patchy small vessel disease in the centra semiovale bilaterally. There is a prior small infarct in the inferior left cerebellum. There is a prior small infarct in the right pons. Elsewhere gray-white compartments appear normal. No acute infarct evident. Vascular: No hyperdense vessels are evident. There is calcification in each cavernous carotid artery region as well as in the distal left vertebral artery. Skull: The bony calvarium appears intact. Sinuses/Orbits: There is mucosal thickening in several ethmoid air cells. Other visualized paranasal sinuses are clear. Visualized orbits appear symmetric bilaterally. Other: Mastoid air cells are clear. IMPRESSION: Atrophy with patchy supratentorial small vessel disease. 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 No acute infarct evident. No mass or hemorrhage. There are foci of arterial vascular calcification. There is mucosal thickening in several ethmoid air cells. Electronically Signed   By: Lowella Grip III M.D.   On: 02/19/2018 07:59  Dg Chest Port 1 View  Result Date: 02/19/2018 CLINICAL DATA:  Unresponsive.  Dialysis patient. EXAM: PORTABLE CHEST 1 VIEW COMPARISON:  07/12/2017 FINDINGS: Mild left lower lobe airspace disease unchanged. Right lung clear. Negative for edema or effusion. Prior median sternotomy. Heart size upper normal. Vascular stent in the left arm. IMPRESSION:  Mild left lower lobe atelectasis/infiltrate, similar to the prior study. Electronically Signed   By: Franchot Gallo M.D.   On: 02/19/2018 07:37     Medications:   . aspirin EC  81 mg Oral Daily  . atorvastatin  80 mg Oral q1800  . calcium acetate  1,334 mg Oral TID WC  . Chlorhexidine Gluconate Cloth  6 each Topical Q0600  . [START ON 02/20/2018] clopidogrel  75 mg Oral Q breakfast  . heparin  5,000 Units Subcutaneous Q8H  . [START ON 02/20/2018] Influenza vac split quadrivalent PF  0.5 mL Intramuscular Tomorrow-1000  . insulin aspart  0-15 Units Subcutaneous TID WC  . insulin aspart  0-5 Units Subcutaneous QHS  . losartan  25 mg Oral Daily  . metoprolol succinate  12.5 mg Oral Q T,Th,Sat-1800  . [START ON 02/20/2018] metoprolol succinate  25 mg Oral Q M,W,F,Su-1800  . multivitamin  1 tablet Oral QHS  . sodium chloride flush  3 mL Intravenous Q12H      Assessment/ Plan:  67 y.o. 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   TTS [facility] [address]/ 70 kg/   1. ESRD - HD today - next HD on Saturday  2. AOCKD - Hgb 13.0, Hold EPO  3. SHPTH - monitor phos  4. Altered mental status - work up in progress - neurology following      LOS: [place] 9/5/201910:[id number] [place], [place]  Note: This note was prepared with Dragon dictation. Any transcription errors are unintentional

## 2018-02-19 NOTE — Progress Notes (Signed)
HD tx start    02/19/18 1518  Vital Signs  Pulse Rate 88  Pulse Rate Source Monitor  Resp 19  BP (!) 164/95  BP Location Right Arm  BP Method Automatic  Patient Position (if appropriate) Lying  Oxygen Therapy  SpO2 99 %  O2 Device Room Air  During Hemodialysis Assessment  Blood Flow Rate (mL/min) 350 mL/min  Arterial Pressure (mmHg) -60 mmHg  Venous Pressure (mmHg) 250 mmHg  Transmembrane Pressure (mmHg) 60 mmHg  Ultrafiltration Rate (mL/min) 500 mL/min  Dialysate Flow Rate (mL/min) 800 ml/min  Conductivity: Machine  14  HD Safety Checks Performed Yes  Dialysis Fluid Bolus Normal Saline  Bolus Amount (mL) 250 mL  Intra-Hemodialysis Comments Tx initiated

## 2018-02-19 NOTE — Progress Notes (Deleted)
HD tx start    02/19/18 1518  Vital Signs  Pulse Rate 88  Pulse Rate Source Monitor  Resp 19  BP (!) 164/95  BP Location Right Arm  BP Method Automatic  Patient Position (if appropriate) Lying  Oxygen Therapy  SpO2 99 %  O2 Device Room Air  During Hemodialysis Assessment  Blood Flow Rate (mL/min) 350 mL/min  Arterial Pressure (mmHg) -60 mmHg  Venous Pressure (mmHg) 250 mmHg  Transmembrane Pressure (mmHg) 60 mmHg  Ultrafiltration Rate (mL/min) 500 mL/min  Dialysate Flow Rate (mL/min) 800 ml/min  Conductivity: Machine  400  HD Safety Checks Performed Yes  Dialysis Fluid Bolus Normal Saline  Bolus Amount (mL) 250 mL  Intra-Hemodialysis Comments Tx initiated

## 2018-02-19 NOTE — ED Notes (Addendum)
Unable to obtain second set of blood cultures due to patient difficult stick and limited to sticks on right arm only due to dialysis access in left arm.   Lab staff, Shirlean Mylar contacted and informed that only 1 set collected and able to be collected at this time. States that they will attempt to send phlebotomist to assist.

## 2018-02-19 NOTE — Consult Note (Signed)
Referring Physician: Gorden Harms, MD    Chief Complaint: Altered mental status  HPI: [name] is an 67 y.o. male with history of hypertension, coronary artery disease status post CABG, hyperlipidemia, gout, diabetes mellitus type II, End stage renal disease with history of renal transplant on hemodialysis presenting with worsening altered mental status. Per family, patient went to his regular dialysis but they would not dialyze him because he was lethargic and appeared obtunded. Per dialysis team he was "slumped over in wheelchair and dry heaving". Per patient's family, patient's mental status has been worsening in the last two weeks. Prior to this, he was able to walk unassisted with a walker, use the commode by himself and feed himself. Due to the worsening cognition, he was evaluated by his primary care provider on 02/13/2018 who ordered an MRI brain which was normal. He had  CT head on arrival to the ED which showed no acute finding for altered mental status. 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 Chest x-ray interpreted as atelectasis versus infiltrate similar to prior. He had dialysis yesterday with improvement in his creatine levels from previous. Family state that they are unable to care for him at home due to his worsening functional status.   Date last known well: Unable to determine Time last known well: Unable to determine tPA Given: No: Symptoms started over two weeks ago therefore outside window period for intervention.  Past Medical History:  Diagnosis Date  . Chronic diastolic congestive heart failure ([place])   . Chronic disease anemia   . ESRD (end stage renal disease) on dialysis ([place])    "[place]; [name]" (09/29/2014)  . GERD (gastroesophageal reflux disease)   . Gout   . High cholesterol   . History of blood transfusion    "related to anemia"  . History of stomach ulcers   . Hypertension   . Type II diabetes  mellitus ([place])     Past Surgical History:  Procedure Laterality Date  . ARTERIOVENOUS GRAFT PLACEMENT Left ~ 1996  . CARDIAC CATHETERIZATION  09/29/2014   "[place]"  . CARDIAC CATHETERIZATION N/A 02/26/2016   Procedure: Left Heart Cath and Coronary Angiography Possible PCI;  Surgeon: [name], MD;  Location: [facility] LAB;  Service: Cardiovascular;  Laterality: N/A;  . ENDOSCOPIC RETROGRADE CHOLANGIOPANCREATOGRAPHY (ERCP) WITH PROPOFOL N/A 09/17/2016   Procedure: ENDOSCOPIC RETROGRADE CHOLANGIOPANCREATOGRAPHY (ERCP) WITH PROPOFOL;  Surgeon: [name], MD;  Location: [facility] ENDOSCOPY;  Service: Endoscopy;  Laterality: N/A;  . ERCP N/A 09/10/2016   Procedure: ENDOSCOPIC RETROGRADE CHOLANGIOPANCREATOGRAPHY (ERCP);  Surgeon: [name], MD;  Location: [facility] ENDOSCOPY;  Service: Endoscopy;  Laterality: N/A;  . KIDNEY TRANSPLANT Right 2004  . NEPHRECTOMY TRANSPLANTED ORGAN  2015  . PERCUTANEOUS CORONARY STENT INTERVENTION (PCI-S) N/A 09/30/2014   Procedure: PERCUTANEOUS CORONARY STENT INTERVENTION (PCI-S);  Surgeon: [name], MD;  Location: [facility] CATH LAB;  Service: Cardiovascular;  Laterality: N/A;  . PERITONEAL CATHETER INSERTION  02/2014  . PERITONEAL CATHETER REMOVAL  08/2014  . THROMBECTOMY / ARTERIOVENOUS GRAFT REVISION  2015    Family History  Problem Relation Age of Onset  . Hypertension Mother   . Stroke Mother   . Hypertension Father   . Diabetes Mellitus II Father   . Asthma Father    Social History:  reports that he has quit smoking. His smoking use included cigarettes. He has never used smokeless tobacco. He reports that he does not drink alcohol or use  drugs.  Allergies:  Allergies  Allergen Reactions  . Ivp Dye [Iodinated Diagnostic Agents] Other (See Comments)    Pt denied    Medications:  I have reviewed the patient's current medications. Prior to Admission:  Medications Prior to Admission  Medication Sig Dispense Refill Last Dose  . allopurinol  (ZYLOPRIM) 100 MG tablet Take 100 mg by mouth daily.  10 07/11/2017 at 0800  . aspirin 81 MG EC tablet Take 81 mg by mouth daily.  10 07/11/2017 at 0800  . atorvastatin (LIPITOR) 40 MG tablet Take 2 tablets (80 mg total) by mouth daily at 6 PM. 30 tablet 0 Not Taking at Unknown time  . calcium acetate (PHOSLO) 667 MG capsule Take 1,334 mg by mouth 3 (three) times daily with meals.   07/11/2017 at 2000  . levETIRAcetam (KEPPRA) 1000 MG tablet Take 1 tablet (1,000 mg total) by mouth daily. (Patient taking differently: Take 750 mg by mouth daily. ) 60 tablet 1 07/11/2017 at 0800  . levETIRAcetam (KEPPRA) 250 MG tablet Take on the Days on Dialysis (Tues, Thurs, Sat) after Dialysis. (Patient taking differently: Take 250 mg by mouth daily. Take 250 mg on Dialysis days (Tues, Thurs, Sat) after Dialysis.) 60 tablet 1 07/10/2017 at 1500  . metoprolol succinate (TOPROL-XL) 25 MG 24 hr tablet Take 12.5-25 mg by mouth daily. Take 25 mg by mouth on non-dialysis days and 12.5 mg on dialysis days (Tuesday, Thursday and Saturday) after dialysis.   07/11/2017 at 0800  . mirtazapine (REMERON) 15 MG tablet Take 15 mg by mouth at bedtime.    07/11/2017 at 2000  . acetaminophen (TYLENOL) 325 MG tablet Take 2 tablets (650 mg total) by mouth every 6 (six) hours as needed for mild pain (or Fever >/= 101). 30 tablet 0 PRN at PRN  . clopidogrel (PLAVIX) 75 MG tablet Take 1 tablet (75 mg total) by mouth daily with breakfast. 30 tablet 11 07/11/2017 at 0800  . heparin 100-0.45 UNIT/ML-% infusion Inject 1,000 Units/hr into the vein continuous. (Patient not taking: Reported on 02/19/2018) 250 mL 0 Not Taking at Unknown time  . losartan (COZAAR) 25 MG tablet Take 1 tablet (25 mg total) by mouth daily. (Patient not taking: Reported on 10/23/2016) 30 tablet 2 Not Taking at Unknown time  . multivitamin (RENA-VIT) TABS tablet Take 1 tablet by mouth at bedtime. (Patient not taking: Reported on 09/26/2016) 30 tablet 3 Not Taking at Unknown time  .  nitroGLYCERIN (NITROSTAT) 0.4 MG SL tablet Place 1 tablet (0.4 mg total) under the tongue every 5 (five) minutes x 3 doses as needed for chest pain. (Patient not taking: Reported on 09/26/2016) 25 tablet 12 Not Taking at Unknown time   Scheduled: . aspirin EC  81 mg Oral Daily  . atorvastatin  80 mg Oral q1800  . calcium acetate  1,334 mg Oral TID WC  . Chlorhexidine Gluconate Cloth  6 each Topical Q0600  . clopidogrel  75 mg Oral Q breakfast  . heparin  5,000 Units Subcutaneous Q8H  . Influenza vac split quadrivalent PF  0.5 mL Intramuscular Tomorrow-1000  . insulin aspart  0-15 Units Subcutaneous TID WC  . insulin aspart  0-5 Units Subcutaneous QHS  . losartan  25 mg Oral Daily  . metoprolol succinate  12.5 mg Oral Q T,Th,Sat-1800  . metoprolol succinate  25 mg Oral Q M,W,F,Su-1800  . multivitamin  1 tablet Oral QHS  . sodium chloride flush  3 mL Intravenous Q12H    ROS: Unable to  obtain due to altered mental status and obtundation.  Physical Exam   Vitals Blood pressure (!) 153/68, pulse 80, temperature 99 F (37.2 C), resp. rate 18, height 5\' 5"  (1.651 m), weight 69.1 kg, SpO2 100 %.  Neurological Exam . Lethargic, opens eyes to verbal and tactile stimuli . I. Olfactory not examined . II: Visual fields were full. Pupils were equal, round and reactive to light and accommodation . III,IV, VI: ptosis not present, extra-ocular motions intact bilaterally . V,VII: Unable to assess facial symmetry or sensation . VIII: Unable to assess hearing but will responds to family's voice . IX, X:  gag reflex deffered . XI: Unable to assess . XII: unable to assess midline tongue extension . Tone is spastic on the right, and flaccid in all extremities, no abnormal movements seen  . Muscle strength in all extremities seemed normal.  . Deep tendon reflexes were symmetric  . Sensations were intact to light pinprick in all extremities, withdraws . Gait and station deffered  Data  Reviewed  Laboratory Studies:  Basic Metabolic Panel: Recent Labs  Lab 02/19/18 0700 02/20/18 0454  NA 137 140  K 3.8 4.2  CL 93* 98  CO2 27 28  GLUCOSE 161* 126*  BUN 43* 22  CREATININE 12.89* 8.04*  CALCIUM 10.3 9.1    Liver Function Tests: Recent Labs  Lab 02/19/18 0700  AST 23  ALT 14  ALKPHOS 82  BILITOT 1.3*  PROT 8.5*  ALBUMIN 4.3   Recent Labs  Lab 02/19/18 0700  LIPASE 62*   Recent Labs  Lab 02/19/18 0700 02/19/18 1202  AMMONIA 20 19    CBC: Recent Labs  Lab 02/19/18 0700  WBC 10.6  NEUTROABS 4.3  HGB 13.0  HCT 40.4  MCV 75.4*  PLT 195    Cardiac Enzymes: Recent Labs  Lab 02/19/18 0700 02/19/18 1544 02/19/18 2220 02/20/18 0454  TROPONINI 0.08* 0.07* 0.07* 0.09*    BNP: Invalid input(s): POCBNP  CBG: Recent Labs  Lab 02/19/18 0653 02/19/18 2146 02/20/18 0102 02/20/18 0730  GLUCAP 150* 109* 103* 115*    Microbiology: Results for orders placed or performed during the hospital encounter of 02/19/18  Culture, blood (routine x 2)     Status: None (Preliminary result)   Collection Time: 02/19/18  9:17 AM  Result Value Ref Range Status   Specimen Description BLOOD RIGHT ARM  Final   Special Requests   Final    BOTTLES DRAWN AEROBIC AND ANAEROBIC Blood Culture adequate volume   Culture   Final    NO GROWTH < 24 HOURS Performed at [facility], [address], [place], [place] [address]    Report Status PENDING  Incomplete  Culture, blood (routine x 2)     Status: None (Preliminary result)   Collection Time: 02/19/18  9:48 AM  Result Value Ref Range Status   Specimen Description BLOOD RIGHT FINGER  Final   Special Requests   Final    BOTTLES DRAWN AEROBIC AND ANAEROBIC Blood Culture adequate volume   Culture  Setup Time   Final    Organism ID to follow AEROBIC BOTTLE ONLY GRAM POSITIVE COCCI IN CLUSTERS GRAM POSITIVE RODS CRITICAL RESULT CALLED TO, READ BACK BY AND VERIFIED WITH: C/MATT [facility] @0220  02/20/18  [facility] Performed at [facility], [address], [place], [place] [address]    Culture   Final    GRAM POSITIVE COCCI IN CLUSTERS GRAM POSITIVE RODS    Report Status PENDING  Incomplete  Blood Culture ID Panel (Reflexed)     Status: Abnormal   Collection Time: 02/19/18  9:48 AM  Result Value Ref Range Status   Enterococcus species NOT DETECTED NOT DETECTED Final   Listeria monocytogenes NOT DETECTED NOT DETECTED Final   Staphylococcus species DETECTED (A) NOT DETECTED Final    Comment: Methicillin (oxacillin) susceptible coagulase negative staphylococcus. Possible blood culture contaminant (unless isolated from more than one blood culture draw or clinical case suggests pathogenicity). No antibiotic treatment is indicated for blood  culture contaminants. CRITICAL RESULT CALLED TO, READ BACK BY AND VERIFIED WITH: C/MATT [facility] @0220  02/20/18 [facility]    Staphylococcus aureus NOT DETECTED NOT DETECTED Final   Methicillin resistance NOT DETECTED NOT DETECTED Final   Streptococcus species NOT DETECTED NOT DETECTED Final   Streptococcus agalactiae NOT DETECTED NOT DETECTED Final   Streptococcus pneumoniae NOT DETECTED NOT DETECTED Final   Streptococcus pyogenes NOT DETECTED NOT DETECTED Final   Acinetobacter baumannii NOT DETECTED NOT DETECTED Final   Enterobacteriaceae species NOT DETECTED NOT DETECTED Final   Enterobacter cloacae complex NOT DETECTED NOT DETECTED Final   Escherichia coli NOT DETECTED NOT DETECTED Final   Klebsiella oxytoca NOT DETECTED NOT DETECTED Final   Klebsiella pneumoniae NOT DETECTED NOT DETECTED Final   Proteus species NOT DETECTED NOT DETECTED Final   Serratia marcescens NOT DETECTED NOT DETECTED Final   Haemophilus influenzae NOT DETECTED NOT DETECTED Final   Neisseria meningitidis NOT DETECTED NOT DETECTED Final   Pseudomonas aeruginosa NOT DETECTED NOT DETECTED Final   Candida albicans NOT DETECTED NOT DETECTED Final   Candida glabrata NOT DETECTED NOT  DETECTED Final   Candida krusei NOT DETECTED NOT DETECTED Final   Candida parapsilosis NOT DETECTED NOT DETECTED Final   Candida tropicalis NOT DETECTED NOT DETECTED Final    Comment: Performed at [facility], [address] [place], [place] [address]  MRSA PCR Screening     Status: None   Collection Time: 02/19/18 12:36 PM  Result Value Ref Range Status   MRSA by PCR NEGATIVE NEGATIVE Final    Comment:        The GeneXpert MRSA Assay (FDA approved for NASAL specimens only), is one component of a comprehensive MRSA colonization surveillance program. It is not intended to diagnose MRSA infection nor to guide or monitor treatment for MRSA infections. Performed at [facility], [address] [place],  [address]     Coagulation Studies: Recent Labs    02/19/18 0700  LABPROT 12.4  INR 0.93    Urinalysis: No results for input(s): COLORURINE, LABSPEC, PHURINE, GLUCOSEU, HGBUR, BILIRUBINUR, KETONESUR, PROTEINUR, UROBILINOGEN, NITRITE, LEUKOCYTESUR in the last 168 hours.  Invalid input(s): APPERANCEUR  Lipid Panel:    Component Value Date/Time   CHOL 115 09/30/2014 0640   CHOL 103 08/06/2013 0551   TRIG 79 09/30/2014 0640   TRIG 191 08/06/2013 0551   HDL 30 (L) 09/30/2014 0640   HDL 32 (L) 08/06/2013 0551   CHOLHDL 3.8 09/30/2014 0640   VLDL 16 09/30/2014 0640   VLDL 38 08/06/2013 0551   LDLCALC 69 09/30/2014 0640   LDLCALC 33 08/06/2013 0551    HgbA1C:  Lab Results  Component Value Date   HGBA1C 6.3 (H) 02/19/2018    Urine Drug Screen:  No results found for: LABOPIA, COCAINSCRNUR, LABBENZ, AMPHETMU, THCU, LABBARB  Alcohol Level:  Recent Labs  Lab 02/19/18 0700  ETH <10    Imaging: Ct Head Wo Contrast  Result Date: 02/19/2018 CLINICAL DATA:  Altered  level of consciousness EXAM: CT HEAD WITHOUT CONTRAST TECHNIQUE: Contiguous axial images were obtained from the base of the skull through the vertex without intravenous contrast.  COMPARISON:  Head CT July 26, 2016; brain MRI July 26, 2016 FINDINGS: Brain: There is moderate diffuse atrophy. There is no intracranial mass, hemorrhage, extra-axial fluid collection, or midline shift. There is patchy small vessel disease in the centra semiovale bilaterally. There is a prior small infarct in the inferior left cerebellum. There is a prior small infarct in the right pons. Elsewhere gray-white compartments appear normal. No acute infarct evident. Vascular: No hyperdense vessels are evident. There is calcification in each cavernous carotid artery region as well as in the distal left vertebral artery. Skull: The bony calvarium appears intact. Sinuses/Orbits: There is mucosal thickening in several ethmoid air cells. Other visualized paranasal sinuses are clear. Visualized orbits appear symmetric bilaterally. Other: Mastoid air cells are clear. IMPRESSION: Atrophy with patchy supratentorial small vessel disease. 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 No acute infarct evident. No mass or hemorrhage. There are foci of arterial vascular calcification. There is mucosal thickening in several ethmoid air cells. Electronically Signed   By: Lowella Grip III M.D.   On: 02/19/2018 07:59   Mr Brain Wo Contrast  Result Date: 02/19/2018 CLINICAL DATA:  Altered mental status for 2 weeks. History of end-stage renal disease on dialysis, hypertension, hypercholesterolemia and diabetes. EXAM: MRI HEAD WITHOUT CONTRAST TECHNIQUE: Multiplanar, multiecho pulse sequences of the brain and surrounding structures were obtained without intravenous contrast. COMPARISON:  CT HEAD February 19, 2018 and MRI of the head February 13, 2018 FINDINGS: Moderately motion degraded examination. INTRACRANIAL CONTENTS: 6 mm reduced diffusion RIGHT basal ganglia with low ADC values. Minimal T2 shine through LEFT fourth frontal periventricular white matter. Scattered chronic microhemorrhages better seen on today's  SWI sequence. Cerebellar dentate nuclei and basal ganglia mineralization. Moderate to severe parenchymal brain volume loss. Patchy to confluent supratentorial white matter FLAIR T2 hyperintensities. Small area RIGHT occipital lobe and LEFT frontal encephalomalacia. Old small LEFT cerebellar infarcts. Old small pontine lacunar infarcts. Old bilateral basal ganglia lacunar infarcts. VASCULAR: Normal major intracranial vascular flow voids present at skull base. SKULL AND UPPER CERVICAL SPINE: No abnormal sellar expansion. No suspicious calvarial bone marrow signal. Craniocervical junction maintained. SINUSES/ORBITS: The mastoid air-cells and included paranasal sinuses are well-aerated.The included ocular globes and orbital contents are non-suspicious. OTHER: None. IMPRESSION: 1. Moderately motion degraded examination. Acute RIGHT basal ganglia lacunar infarct. 2. Old small LEFT frontal RIGHT occipital lobe infarcts. Multiple old small supra and infratentorial infarcts. 3. Moderate chronic small vessel ischemic changes. 4. Moderate to severe parenchymal brain volume loss. Electronically Signed   By: Elon Alas M.D.   On: 02/19/2018 15:05   Dg Chest Port 1 View  Result Date: 02/19/2018 CLINICAL DATA:  Unresponsive.  Dialysis patient. EXAM: PORTABLE CHEST 1 VIEW COMPARISON:  07/12/2017 FINDINGS: Mild left lower lobe airspace disease unchanged. Right lung clear. Negative for edema or effusion. Prior median sternotomy. Heart size upper normal. Vascular stent in the left arm. IMPRESSION: Mild left lower lobe atelectasis/infiltrate, similar to the prior study. Electronically Signed   By: Franchot Gallo M.D.   On: 02/19/2018 07:37     Patient seen and examined.  Clinical course and management discussed.  Necessary edits performed.  I agree with the above.  Assessment and plan of care developed and discussed below.     Assessment: 67 y.o. male presenting with 2 weeks of worsening altered  mental status noted to  have acute right basal ganglia lacunar infarct on MRI brain which was personally reviewed.  Etiology likely small vessel disease.  Altered mental status likely multifactorial in the setting of acute stroke, metabolic and current infectious process, but stroke alone not significant enough to cause these mental status changes. EEG did not show any epileptiform discharges. US carotids showed no significant stenosis within either internal carotid artery.  Echocardiogram did not show show source of emboli with EF of 30-35%.  HgbA1c,6.3. LDL, 20  Stroke Risk Factors - diabetes mellitus, family history, hyperlipidemia and hypertension  Plan: 1. Continue dual antiplatelet therapy with Aspirin 81 mg/day and Plavix 75 mg /day  2. Continue statin 3. PT consult, OT consult, Speech consult 4. NPO until RN stroke swallow screen 5. Telemetry monitoring 6. Agree with palliative consult per family wishes 27. Frequent neuro checks 8. Renal following patient for optimization of renal status   This patient was staffed with Dr. Magda Paganini, Doy Mince who personally evaluated patient, reviewed documentation and agreed with assessment and plan of care as above.  Rufina Falco, DNP, FNP-BC Board certified Nurse Practitioner Neurology Department  02/20/2018, 9:21 AM   Alexis Goodell, MD Neurology [phone]  02/20/2018  4:50 PM

## 2018-02-19 NOTE — Progress Notes (Signed)
Post HD assessment. Pt tolerated tx well. Pt experienced a brief symptomatic drop in BP requiring a total of 150 NS bolus to be given. Pt's UF goal was decreased r/t hypotension, MD aware. Pt's access shows signs of stenosis at venous site, MD aware. Net UF 204, goal not met.    02/19/18 1842  Vital Signs  Temp 97.9 F (36.6 C)  Temp Source Axillary  Pulse Rate 87  Pulse Rate Source Monitor  Resp (!) 26  BP (!) 148/77  BP Location Right Leg  BP Method Automatic  Patient Position (if appropriate) Lying  Oxygen Therapy  SpO2 99 %  O2 Device Room Air  Dialysis Weight  Weight 69.7 kg  Type of Weight Post-Dialysis  Post-Hemodialysis Assessment  Rinseback Volume (mL) 250 mL  KECN 54.6 V  Dialyzer Clearance Lightly streaked  Duration of HD Treatment -hour(s) 3 hour(s)  Hemodialysis Intake (mL) 650 mL  UF Total -Machine (mL) 854 mL  Net UF (mL) 204 mL  Tolerated HD Treatment Yes  AVG/AVF Arterial Site Held (minutes) 15 minutes  AVG/AVF Venous Site Held (minutes) 15 minutes  Education / Care Plan  Dialysis Education Provided Yes  Documented Education in Care Plan Yes

## 2018-02-19 NOTE — Progress Notes (Signed)
Post HD assessment    02/19/18 1841  Neurological  Level of Consciousness Alert  Orientation Level Disoriented X4  Respiratory  Respiratory Pattern Apnea;Tachypnea  Chest Assessment Chest expansion symmetrical  Cardiac  ECG Monitor Yes  Vascular  R Radial Pulse +2  L Radial Pulse +2  Integumentary  Integumentary (WDL) X  Skin Color Appropriate for ethnicity  Skin Integrity Ecchymosis (dialysis L-AVG access )  Ecchymosis Location Arm  Ecchymosis Location Orientation Left;Lower ( L-AVG)  Musculoskeletal  Musculoskeletal (WDL) X  Generalized Weakness Yes  Assistive Device None  GU Assessment  Genitourinary (WDL) X  Genitourinary Symptoms  (HD)  Psychosocial  Psychosocial (WDL) X  Patient Behaviors Not interactive

## 2018-02-19 NOTE — ED Provider Notes (Signed)
[facility] Emergency Department Provider Note   ____________________________________________   First MD Initiated Contact with Patient 02/19/18 [phone]     (approximate)  I have reviewed the triage vital signs and the nursing notes.   HISTORY  Chief Complaint AMS  Level V caveat: limited by altered mentation  HPI [name] is a 67 y.o. male who presents to the ED from dialysis center with a chief complaint of altered mentation.  Patient has a history of end-stage renal disease on dialysis Tuesday/Thursday/Saturday, seizure disorder, hypertension, diabetes.  Wife states patient has had intermittent confusion over the past 2 weeks.  This morning she took him to dialysis and they were told to come to the emergency department due to his altered mental status.  Wife denies recent fever, chest pain, shortness of breath, seizures, abdominal pain, vomiting or diarrhea.  Wife denies seizure this morning.  Denies recent trauma.   Past Medical History:  Diagnosis Date  . Chronic diastolic congestive heart failure ([place])   . Chronic disease anemia   . ESRD (end stage renal disease) on dialysis ([place])    "[place]; [place]; [name]" (09/29/2014)  . GERD (gastroesophageal reflux disease)   . Gout   . High cholesterol   . History of blood transfusion    "related to anemia"  . History of stomach ulcers   . Hypertension   . Type II diabetes mellitus [facility])     Patient Active Problem List   Diagnosis Date Noted  . Calculus of common duct   . Fitting and adjustment of gastrointestinal appliance and device   . Elevated LFTs 09/16/2016  . Calculus of bile duct without cholecystitis with obstruction   . Choledocholithiasis   . Diabetes ([place]) 09/04/2016  . HTN (hypertension) 09/04/2016  . GERD (gastroesophageal reflux disease) 09/04/2016  . Chronic diastolic CHF (congestive heart failure) ([place]) 09/04/2016  . Transaminitis 09/04/2016  . Sepsis ([place]) 09/04/2016  . CAP  (community acquired pneumonia) 09/04/2016  . Encephalopathy acute   . HCAP (healthcare-associated pneumonia) 07/24/2016  . Bradycardia 06/25/2016  . Alteration in self-care ability 03/05/2016  . Hx of subdural hematoma 03/05/2016  . 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 NSTEMI (non-ST elevated myocardial infarction) ([place]) 02/26/2016  . H/O non-ST elevation myocardial infarction (NSTEMI) 01/10/2016  . S/P coronary artery stent placement 01/10/2016  . Depression, major, single episode, complete remission ([place]) 07/22/2015  . Acute respiratory failure with hypoxia ([place]) 02/14/2015  . Syncope 11/05/2014  . Acute cystitis with hematuria 10/11/2014  . Acute subdural hematoma ([place]) 10/10/2014  . Anemia in chronic kidney disease (CKD) 10/07/2014  . ESRD on hemodialysis ([place]) 10/07/2014  . Mixed hyperlipidemia 10/07/2014  . 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 Contaminant given to patient   . Shortness of breath   . Viridans streptococci infection   . Polymicrobial bacterial infection   . Bacteremia   . 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 ESRD on dialysis ([place])   . Type 1 diabetes mellitus with other diabetic kidney complication ([place])   . Acute coronary syndrome ([place]) 09/29/2014  . Anemia associated with chronic renal failure 03/25/2014  . Nausea & vomiting 03/25/2014  . Gout 03/17/2012  . H/O: upper GI bleed 03/17/2012  . Hyperlipidemia, unspecified 03/17/2012  . Kidney transplant status, cadaveric 07/16/2002    Past Surgical History:  Procedure Laterality Date  . ARTERIOVENOUS GRAFT PLACEMENT Left ~ 1996  . CARDIAC CATHETERIZATION  09/29/2014   "[place]"  . CARDIAC CATHETERIZATION  N/A 02/26/2016   Procedure: Left Heart Cath and Coronary Angiography Possible PCI;  Surgeon: [name], MD;  Location: [place] CV LAB;  Service: Cardiovascular;  Laterality: N/A;  . ENDOSCOPIC RETROGRADE CHOLANGIOPANCREATOGRAPHY (ERCP) WITH PROPOFOL N/A 09/17/2016    Procedure: ENDOSCOPIC RETROGRADE CHOLANGIOPANCREATOGRAPHY (ERCP) WITH PROPOFOL;  Surgeon: [name], MD;  Location: [facility] ENDOSCOPY;  Service: Endoscopy;  Laterality: N/A;  . ERCP N/A 09/10/2016   Procedure: ENDOSCOPIC RETROGRADE CHOLANGIOPANCREATOGRAPHY (ERCP);  Surgeon: [name], MD;  Location[facility] ENDOSCOPY;  Service: Endoscopy;  Laterality: N/A;  . KIDNEY TRANSPLANT Right 2004  . NEPHRECTOMY TRANSPLANTED ORGAN  2015  . PERCUTANEOUS CORONARY STENT INTERVENTION (PCI-S) N/A 09/30/2014   Procedure: PERCUTANEOUS CORONARY STENT INTERVENTION (PCI-S);  Surgeon: [name], MD;  Location: [facility] CATH LAB;  Service: Cardiovascular;  Laterality: N/A;  . PERITONEAL CATHETER INSERTION  02/2014  . PERITONEAL CATHETER REMOVAL  08/2014  . THROMBECTOMY / ARTERIOVENOUS GRAFT REVISION  2015    Prior to Admission medications   Medication Sig Start Date End Date Taking? Authorizing Provider  acetaminophen (TYLENOL) 325 MG tablet Take 2 tablets (650 mg total) by mouth every 6 (six) hours as needed for mild pain (or Fever >/= 101). 02/27/16   [name], MD  allopurinol (ZYLOPRIM) 100 MG tablet Take 100 mg by mouth daily. 09/23/14   [provider]  aspirin 81 MG EC tablet Take 81 mg by mouth daily. 09/23/14   [provider]  atorvastatin (LIPITOR) 40 MG tablet Take 2 tablets (80 mg total) by mouth daily at 6 PM. Patient not taking: Reported on 07/12/2017 09/14/16   Bettey Costa, MD  atorvastatin (LIPITOR) 80 MG tablet Take 80 mg by mouth daily.  09/25/16   [provider]  calcium acetate (PHOSLO) 667 MG capsule Take 1,334 mg by mouth 3 (three) times daily with meals.    [provider]  carvedilol (COREG) 6.25 MG tablet Take 1 tablet (6.25 mg total) by mouth 2 (two) times daily with a meal. Patient not taking: Reported on 09/26/2016 09/18/16   Demetrios Loll, MD  clopidogrel (PLAVIX) 75 MG tablet Take 1 tablet (75 mg total) by mouth daily with breakfast. 10/02/14   [name],  MD  epoetin alfa (EPOGEN,PROCRIT) 4000 UNIT/ML injection Inject 4,000 Units into the vein.    [provider]  heparin 100-0.45 UNIT/ML-% infusion Inject 1,000 Units/hr into the vein continuous. 02/27/16   [name], MD  levETIRAcetam (KEPPRA) 1000 MG tablet Take 1 tablet (1,000 mg total) by mouth daily. 07/29/16   Henreitta Leber, MD  levETIRAcetam (KEPPRA) 250 MG tablet Take on the Days on Dialysis (Tues, Thurs, Sat) after Dialysis. Patient taking differently: Take 250 mg by mouth 3 (three) times daily. Take 250 mg on Dialysis days (Tues, Thurs, Sat) after Dialysis. 07/29/16   Henreitta Leber, MD  losartan (COZAAR) 25 MG tablet Take 1 tablet (25 mg total) by mouth daily. Patient not taking: Reported on 10/23/2016 09/18/16   Demetrios Loll, MD  metoprolol succinate (TOPROL-XL) 25 MG 24 hr tablet Take 12.5-25 mg by mouth daily. Take 25 mg by mouth on non-dialysis days and 12.5 mg on dialysis days (Tuesday, Thursday and Saturday) after dialysis.    [provider]  midodrine (PROAMATINE) 10 MG tablet Take 10 mg by mouth 3 (three) times a week. Take 10 mg by mouth 30 minutes prior to dialysis on Tuesday, Thursday and Saturday.    [provider]  mirtazapine (REMERON) 7.5 MG tablet Take 7.5 mg by  mouth at bedtime.    [provider]  multivitamin (RENA-VIT) TABS tablet Take 1 tablet by mouth at bedtime. Patient not taking: Reported on 09/26/2016 10/02/14   [name], MD  nitroGLYCERIN (NITROSTAT) 0.4 MG SL tablet Place 1 tablet (0.4 mg total) under the tongue every 5 (five) minutes x 3 doses as needed for chest pain. Patient not taking: Reported on 09/26/2016 10/02/14   [name], MD    Allergies Ivp dye [iodinated diagnostic agents]  Family History  Problem Relation Age of Onset  . Hypertension Mother   . Stroke Mother   . Hypertension Father   . Diabetes Mellitus II Father   . Asthma Father     Social History Social History   Tobacco Use  .  Smoking status: Former Smoker    Types: Cigarettes  . Smokeless tobacco: Never Used  . Tobacco comment: "quit smoking cigarettes in the 1980's"  Substance Use Topics  . Alcohol use: No  . Drug use: No    Review of Systems  Constitutional: No fever/chills Eyes: No visual changes. ENT: No sore throat. Cardiovascular: Denies chest pain. Respiratory: Denies shortness of breath. Gastrointestinal: No abdominal pain.  No nausea, no vomiting.  No diarrhea.  No constipation. Genitourinary: Negative for dysuria. Musculoskeletal: Negative for back pain. Skin: Negative for rash. Neurological: Positive for altered mentation. Negative for headaches, focal weakness or numbness.   ____________________________________________   PHYSICAL EXAM:  VITAL SIGNS: ED Triage Vitals  Enc Vitals Group     BP      Pulse      Resp      Temp      Temp src      SpO2      Weight      Height      Head Circumference      Peak Flow      Pain Score      Pain Loc      Pain Edu?      Excl. in [place]?     Constitutional: Obtunded. Ill appearing and in severe acute distress. Eyes: Conjunctivae are normal. PERRL. EOMI. Head: Atraumatic. Nose: No congestion/rhinnorhea. Mouth/Throat: Mucous membranes are moist.  Oropharynx non-erythematous. Neck: No stridor.   Cardiovascular: Normal rate, regular rhythm. Grossly normal heart sounds.  Good peripheral circulation. Respiratory: Increased respiratory effort.  No retractions. Lungs with audible rales.  Spitting up frothy sputum. Gastrointestinal: Soft and nontender. No distention. No abdominal bruits. No CVA tenderness. Musculoskeletal: No lower extremity tenderness nor edema.  No joint effusions. Neurologic: Obtunded.  Eyes open spontaneously. Skin:  Skin is warm, dry and intact. No rash noted. Psychiatric: Unable to assess. ____________________________________________   LABS (all labs ordered are listed, but only abnormal results are displayed)  Labs  Reviewed  GLUCOSE, CAPILLARY - Abnormal; Notable for the following components:      Result Value   Glucose-Capillary 150 (*)    All other components within normal limits  COMPREHENSIVE METABOLIC PANEL  TROPONIN I  ETHANOL  CBC WITH DIFFERENTIAL/PLATELET  URINALYSIS, COMPLETE (UACMP) WITH MICROSCOPIC  URINE DRUG SCREEN, QUALITATIVE ([facility] ONLY)  LIPASE, BLOOD  AMMONIA  PROTIME-INR  LACTIC ACID, PLASMA  LACTIC ACID, PLASMA  LEVETIRACETAM LEVEL   ____________________________________________  EKG  ED ECG REPORT I, Hennesy Sobalvarro J, the attending physician, personally viewed and interpreted this ECG.   Date: 02/19/2018  EKG Time: 0654  Rate: 75  Rhythm: normal EKG, normal sinus rhythm  Axis: Normal  Intervals:none  ST&T Change: Nonspecific  ____________________________________________  RADIOLOGY  ED MD interpretation: Pending  Official radiology report(s): No results found.  ____________________________________________   PROCEDURES  Procedure(s) performed: None  Procedures  Critical Care performed: No  ____________________________________________   INITIAL IMPRESSION / ASSESSMENT AND PLAN / ED COURSE  As part of my medical decision making, I reviewed the following data within the electronic MEDICAL RECORD NUMBER History obtained from family, Nursing notes reviewed and incorporated, EKG interpreted, Old chart reviewed, Patient signed out to and Notes from prior ED visits   67 year old male with end-stage renal disease, hypertension, diabetes brought for altered mental status. Differential diagnosis includes, but is not limited to, alcohol, illicit or prescription medications, or other toxic ingestion; intracranial pathology such as stroke or intracerebral hemorrhage; fever or infectious causes including sepsis; hypoxemia and/or hypercarbia; uremia; trauma; endocrine related disorders such as diabetes, hypoglycemia, and thyroid-related diseases; hypertensive  encephalopathy; etc.  Will obtain screening labwork, CT head, CXR; anticipate hospitalization. Care transferred to Dr. Reita Cliche.      ____________________________________________   FINAL CLINICAL IMPRESSION(S) / ED DIAGNOSES  Final diagnoses:  Altered mental status, unspecified altered mental status type     ED Discharge Orders    None       Note:  This document was prepared using Dragon voice recognition software and may include unintentional dictation errors.    Paulette Blanch, MD 02/19/18 [phone]

## 2018-02-19 NOTE — Progress Notes (Signed)
Family Meeting Note  Advance Directive:yes  Today a meeting took place with the Patient, wife.  Patient is unable to participate due [id number] capacity obtunded   The following clinical team members were present during this meeting:MD  The following were discussed:Patient's diagnosis:esrd, ams, cap , Patient's progosis: Unable to determine and Goals for treatment: DNR  Additional follow-up to be provided: prn  Time spent during discussion:20 minutes  Gorden Harms, MD

## 2018-02-19 NOTE — Progress Notes (Signed)
eeg complete.

## 2018-02-19 NOTE — ED Notes (Addendum)
Pt does not make urine at all per wife. Pt has had generalized weakness and increase in number of hours of sleep over past 2 weeks-only wakes up to eat and take medications. Nonambulatory X 2 weeks. Pt went to have dialysis today and they refused him. Last dialysis on Tuesday. Pt non eye opening, nonverbal currently. Wife at bedside.

## 2018-02-19 NOTE — Progress Notes (Signed)
Pre HD assessment    02/19/18 1506  Neurological  Level of Consciousness Responds to Pain  Respiratory  Respiratory Pattern Tachypnea;Regular  Chest Assessment Chest expansion symmetrical  Cardiac  ECG Monitor Yes  Vascular  R Radial Pulse +2  L Radial Pulse +2  Integumentary  Integumentary (WDL) X  Skin Color Appropriate for ethnicity  Skin Integrity Ecchymosis (dialysis L-AVG access )  Ecchymosis Location Arm  Ecchymosis Location Orientation Left;Lower ( L-AVG)  Musculoskeletal  Musculoskeletal (WDL) X  Generalized Weakness Yes  Assistive Device None  GU Assessment  Genitourinary (WDL) X  Genitourinary Symptoms  (HD)  Psychosocial  Psychosocial (WDL) X  Patient Behaviors Not interactive

## 2018-02-19 NOTE — H&P (Signed)
[facility] NAME: [name]    MR#:  [id number]  DATE OF BIRTH:  [birth year]  DATE OF ADMISSION:  02/19/2018  PRIMARY CARE PHYSICIAN: [name], MD   REQUESTING/REFERRING PHYSICIAN:   CHIEF COMPLAINT:  No chief complaint on file.   HISTORY OF PRESENT ILLNESS: [name]  is a 67 y.o. male with a known history of per below presenting from hemodialysis prior to receiving dialysis for altered mental status, per wife the patient has been altered mentally for 2 weeks, unable to walk, lying in bed, limited verbal output, in the emergency room patient was found to have blood pressure in the 190s, normal blood gas, creatinine 12, CT noted for old strokes/no acute process, chest x-ray noted for questionable left pneumonia, troponin 0.08, CBC unimpressive, wife at the bedside, patient is DNR per her, patient is poor historian due to encephalopathy/obtundation, patient now be admitted for acute/subacute encephalopathy, acute probable left community-acquired pneumonia, and end-stage renal disease due for hemodialysis today.  PAST MEDICAL HISTORY:   Past Medical History:  Diagnosis Date  . Chronic diastolic congestive heart failure ([facility])   . Chronic disease anemia   . ESRD (end stage renal disease) on dialysis ([facility])    "[facility]; [facility]; [name]" (09/29/2014)  . GERD (gastroesophageal reflux disease)   . Gout   . High cholesterol   . History of blood transfusion    "related to anemia"  . History of stomach ulcers   . Hypertension   . Type II diabetes mellitus ([facility])     PAST SURGICAL HISTORY:  Past Surgical History:  Procedure Laterality Date  . ARTERIOVENOUS GRAFT PLACEMENT Left ~ 1996  . CARDIAC CATHETERIZATION  09/29/2014   ""  . CARDIAC CATHETERIZATION N/A 02/26/2016   Procedure: Left Heart Cath and Coronary Angiography Possible PCI;  Surgeon: [name], MD;  Location: [facility];  Service: Cardiovascular;   Laterality: N/A;  . ENDOSCOPIC RETROGRADE CHOLANGIOPANCREATOGRAPHY (ERCP) WITH PROPOFOL N/A 09/17/2016   Procedure: ENDOSCOPIC RETROGRADE CHOLANGIOPANCREATOGRAPHY (ERCP) WITH PROPOFOL;  Surgeon: [name], MD;  Location: [facility] ENDOSCOPY;  Service: Endoscopy;  Laterality: N/A;  . ERCP N/A 09/10/2016   Procedure: ENDOSCOPIC RETROGRADE CHOLANGIOPANCREATOGRAPHY (ERCP);  Surgeon: [name], MD;  Location: [facility] ENDOSCOPY;  Service: Endoscopy;  Laterality: N/A;  . KIDNEY TRANSPLANT Right 2004  . NEPHRECTOMY TRANSPLANTED ORGAN  2015  . PERCUTANEOUS CORONARY STENT INTERVENTION (PCI-S) N/A 09/30/2014   Procedure: PERCUTANEOUS CORONARY STENT INTERVENTION (PCI-S);  Surgeon: [name], MD;  Location: [facility] CATH LAB;  Service: Cardiovascular;  Laterality: N/A;  . PERITONEAL CATHETER INSERTION  02/2014  . PERITONEAL CATHETER REMOVAL  08/2014  . THROMBECTOMY / ARTERIOVENOUS GRAFT REVISION  2015    SOCIAL HISTORY:  Social History   Tobacco Use  . Smoking status: Former Smoker    Types: Cigarettes  . Smokeless tobacco: Never Used  . Tobacco comment: "quit smoking cigarettes in the 1980's"  Substance Use Topics  . Alcohol use: No    FAMILY HISTORY:  Family History  Problem Relation Age of Onset  . Hypertension Mother   . Stroke Mother   . Hypertension Father   . Diabetes Mellitus II Father   . Asthma Father     DRUG ALLERGIES:  Allergies  Allergen Reactions  . Ivp Dye [Iodinated Diagnostic Agents] Other (See Comments)    Pt denied    REVIEW OF SYSTEMS: Unable to be obtained given encephalopathy  CONSTITUTIONAL: No fever, fatigue or weakness.  EYES: No blurred or double vision.  EARS, NOSE, AND THROAT: No tinnitus or ear pain.  RESPIRATORY: No cough, shortness of breath, wheezing or hemoptysis.  CARDIOVASCULAR: No chest pain, orthopnea, edema.  GASTROINTESTINAL: No nausea, vomiting, diarrhea or abdominal pain.  GENITOURINARY: No dysuria, hematuria.  ENDOCRINE: No polyuria, nocturia,   HEMATOLOGY: No anemia, easy bruising or bleeding SKIN: No rash or lesion. MUSCULOSKELETAL: No joint pain or arthritis.   NEUROLOGIC: No tingling, numbness, weakness.  PSYCHIATRY: No anxiety or depression.   MEDICATIONS AT HOME:  Prior to Admission medications   Medication Sig Start Date End Date Taking? Authorizing Provider  allopurinol (ZYLOPRIM) 100 MG tablet Take 100 mg by mouth daily. 09/23/14  Yes [provider]  aspirin 81 MG EC tablet Take 81 mg by mouth daily. 09/23/14  Yes [provider]  atorvastatin (LIPITOR) 40 MG tablet Take 2 tablets (80 mg total) by mouth daily at 6 PM. 09/14/16  Yes Mody, Sital, MD  calcium acetate (PHOSLO) 667 MG capsule Take 1,334 mg by mouth 3 (three) times daily with meals.   Yes [provider]  levETIRAcetam (KEPPRA) 1000 MG tablet Take 1 tablet (1,000 mg total) by mouth daily. Patient taking differently: Take 750 mg by mouth daily.  07/29/16  Yes Henreitta Leber, MD  levETIRAcetam (KEPPRA) 250 MG tablet Take on the Days on Dialysis (Tues, Thurs, Sat) after Dialysis. Patient taking differently: Take 250 mg by mouth daily. Take 250 mg on Dialysis days (Tues, Thurs, Sat) after Dialysis. 07/29/16  Yes Henreitta Leber, MD  metoprolol succinate (TOPROL-XL) 25 MG 24 hr tablet Take 12.5-25 mg by mouth daily. Take 25 mg by mouth on non-dialysis days and 12.5 mg on dialysis days (Tuesday, Thursday and Saturday) after dialysis.   Yes [provider]  mirtazapine (REMERON) 15 MG tablet Take 15 mg by mouth at bedtime.    Yes [provider]  acetaminophen (TYLENOL) 325 MG tablet Take 2 tablets (650 mg total) by mouth every 6 (six) hours as needed for mild pain (or Fever >/= 101). 02/27/16   [name], MD  clopidogrel (PLAVIX) 75 MG tablet Take 1 tablet (75 mg total) by mouth daily with breakfast. 10/02/14   [name], MD  heparin 100-0.45 UNIT/ML-% infusion Inject 1,000 Units/hr into the vein continuous. Patient  not taking: Reported on 02/19/2018 02/27/16   [name], MD  losartan (COZAAR) 25 MG tablet Take 1 tablet (25 mg total) by mouth daily. Patient not taking: Reported on 10/23/2016 09/18/16   Demetrios Loll, MD  multivitamin (RENA-VIT) TABS tablet Take 1 tablet by mouth at bedtime. Patient not taking: Reported on 09/26/2016 10/02/14   [name], MD  nitroGLYCERIN (NITROSTAT) 0.4 MG SL tablet Place 1 tablet (0.4 mg total) under the tongue every 5 (five) minutes x 3 doses as needed for chest pain. Patient not taking: Reported on 09/26/2016 10/02/14   [name], MD      PHYSICAL EXAMINATION:   VITAL SIGNS: Blood pressure (!) 192/95, pulse 74, temperature 98 F (36.7 C), temperature source Oral, resp. rate 18, SpO2 99 %.  GENERAL:  67 y.o.-year-old patient lying in the bed with no acute distress.  Frail-appearing EYES: Pupils equal, round, reactive to light and accommodation. No scleral icterus. Extraocular muscles intact.  HEENT: Head atraumatic, normocephalic. Oropharynx and nasopharynx clear.  NECK:  Supple, no jugular venous distention. No thyroid enlargement, no tenderness.  LUNGS: Diminished breath sounds bilaterally. No use of accessory muscles of respiration.  CARDIOVASCULAR: S1, S2  normal. No murmurs, rubs, or gallops.  ABDOMEN: Soft, nontender, nondistended. Bowel sounds present. No organomegaly or mass.  EXTREMITIES: No pedal edema, cyanosis, or clubbing.  NEUROLOGIC: Cranial nerves II through XII are intact. MAES. Gait not checked.  Not able to follow commands, opens eyes to voice PSYCHIATRIC: The patient is obtunded   SKIN: No obvious rash, lesion, or ulcer.   LABORATORY PANEL:   CBC Recent Labs  Lab 02/19/18 0700  WBC 10.6  HGB 13.0  HCT 40.4  PLT 195  MCV 75.4*  MCH 24.2*  MCHC 32.0  RDW 15.8*  LYMPHSABS 4.6*  MONOABS 1.0  EOSABS 0.7  BASOSABS 0.1    ------------------------------------------------------------------------------------------------------------------  Chemistries  Recent Labs  Lab 02/19/18 0700  NA 137  K 3.8  CL 93*  CO2 27  GLUCOSE 161*  BUN 43*  CREATININE 12.89*  CALCIUM 10.3  AST 23  ALT 14  ALKPHOS 82  BILITOT 1.3*   ------------------------------------------------------------------------------------------------------------------ CrCl cannot be calculated (Unknown ideal weight.). ------------------------------------------------------------------------------------------------------------------ No results for input(s): TSH, T4TOTAL, T3FREE, THYROIDAB in the last 72 hours.  Invalid input(s): FREET3   Coagulation profile Recent Labs  Lab 02/19/18 0700  INR 0.93   ------------------------------------------------------------------------------------------------------------------- No results for input(s): DDIMER in the last 72 hours. -------------------------------------------------------------------------------------------------------------------  Cardiac Enzymes Recent Labs  Lab 02/19/18 0700  TROPONINI 0.08*   ------------------------------------------------------------------------------------------------------------------ Invalid input(s): POCBNP  ---------------------------------------------------------------------------------------------------------------  Urinalysis    Component Value Date/Time   COLORURINE Straw 02/28/2014 1326   APPEARANCEUR Clear 02/28/2014 1326   LABSPEC 1.010 02/28/2014 1326   PHURINE 5.0 02/28/2014 1326   GLUCOSEU Negative 02/28/2014 1326   HGBUR 1+ 02/28/2014 1326   BILIRUBINUR Negative 02/28/2014 1326   KETONESUR Negative 02/28/2014 1326   PROTEINUR Negative 02/28/2014 1326   NITRITE Negative 02/28/2014 1326   LEUKOCYTESUR Negative 02/28/2014 1326     RADIOLOGY: Ct Head Wo Contrast  Result Date: 02/19/2018 CLINICAL DATA:  Altered level of  consciousness EXAM: CT HEAD WITHOUT CONTRAST TECHNIQUE: Contiguous axial images were obtained from the base of the skull through the vertex without intravenous contrast. COMPARISON:  Head CT July 26, 2016; brain MRI July 26, 2016 FINDINGS: Brain: There is moderate diffuse atrophy. There is no intracranial mass, hemorrhage, extra-axial fluid collection, or midline shift. There is patchy small vessel disease in the centra semiovale bilaterally. There is a prior small infarct in the inferior left cerebellum. There is a prior small infarct in the right pons. Elsewhere gray-white compartments appear normal. No acute infarct evident. Vascular: No hyperdense vessels are evident. There is calcification in each cavernous carotid artery region as well as in the distal left vertebral artery. Skull: The bony calvarium appears intact. Sinuses/Orbits: There is mucosal thickening in several ethmoid air cells. Other visualized paranasal sinuses are clear. Visualized orbits appear symmetric bilaterally. Other: Mastoid air cells are clear. IMPRESSION: Atrophy with patchy supratentorial small vessel disease. 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 No acute infarct evident. No mass or hemorrhage. There are foci of arterial vascular calcification. There is mucosal thickening in several ethmoid air cells. Electronically Signed   By: Lowella Grip III M.D.   On: 02/19/2018 07:59   Dg Chest Port 1 View  Result Date: 02/19/2018 CLINICAL DATA:  Unresponsive.  Dialysis patient. EXAM: PORTABLE CHEST 1 VIEW COMPARISON:  07/12/2017 FINDINGS: Mild left lower lobe airspace disease unchanged. Right lung clear. Negative for edema or effusion. Prior median sternotomy. Heart size upper normal. Vascular stent in the left arm. IMPRESSION: Mild left lower lobe  atelectasis/infiltrate, similar to the prior study. Electronically Signed   By: Franchot Gallo M.D.   On: 02/19/2018 07:37    EKG: Orders placed or performed  during the hospital encounter of 02/19/18  . ED EKG  . ED EKG  . EKG 12-Lead  . EKG 12-Lead  . EKG 12-Lead  . EKG 12-Lead    IMPRESSION AND PLAN: *Acute toxic metabolic encephalopathy *Acute probable left community-acquired pneumonia *Chronic ESRD on HD TTS *Chronic diabetes mellitus type 2 *Chronic PUD *Acute accelerated hypertension *History of seizure disorder   Admit to regular nursing for bed on our pneumonia protocol, increase nursing care PRN/aspiration/fall/skin care/seizure precautions while in house, neurochecks per routine, head of bed at 30 degrees at all times, nephrology consulted for hemodialysis needs, check ammonia level, RPR, check MRI of the brain to evaluate for possible stroke, rule out acute coronary syndrome chronic enzymes x3 sets, check echocardiogram given elevated troponins, cardiology to see, EEG given history of seizure disorder, check Keppra level, neurology consultation for expert opinion, continue DAPT with aspirin/Plavix, statin therapy, continue home antihypertensive regimen, IV hydralazine as needed systolic blood pressure greater than 160, and continue close medical monitoring   All the records are reviewed and case discussed with ED provider. Management plans discussed with the patient, family and they are in agreement.  CODE STATUS:dnr Code Status History    Date Active Date Inactive Code Status Order ID Comments User Context   09/16/2016 0325 09/18/2016 1709 Full Code [id number]  Harvie Bridge, DO Inpatient   09/04/2016 2336 09/14/2016 1909 Full Code [id number]  Lance Coon, MD Inpatient   07/24/2016 0219 07/29/2016 2015 Full Code [id number]  Harvie Bridge, DO Inpatient   02/26/2016 0603 02/27/2016 1705 Full Code [id number]  Harrie Foreman, MD Inpatient   02/26/2016 0540 02/26/2016 0603 DNR [id number]  Harrie Foreman, MD Inpatient   02/14/2015 0816 02/17/2015 1347 DNR [id number]  Epifanio Lesches, MD ED   12/12/2014 2044 12/16/2014 1926 DNR  [id number]  Loletha Grayer, MD ED   11/05/2014 2209 11/09/2014 2148 Full Code [id number]  Henreitta Leber, MD Inpatient   09/30/2014 1348 10/02/2014 1902 Full Code [id number]  [name], MD Inpatient   09/29/2014 2036 09/30/2014 1348 Full Code [id number]  [name], MD Inpatient    Advance Directive Documentation     Most Recent Value  Type of Advance Directive  Healthcare Power of Attorney  Pre-existing out of facility DNR order (yellow form or pink MOST form)  -  "MOST" Form in Place?  -       TOTAL TIME TAKING CARE OF THIS PATIENT: 45 minutes.    Avel Peace Estuardo Frisbee M.D on 02/19/2018   Between 7am to 6pm - Pager - [phone]  After 6pm go to [email or web address] - password [email or web address] [facility]  Office  [phone]  CC: Primary care physician; [name], MD   Note: This dictation was prepared with Dragon dictation along with smaller phrase technology. Any transcriptional errors that result from this process are unintentional.

## 2018-02-19 NOTE — ED Triage Notes (Signed)
Patient's family member states that he went to dialysis this morning and "they would not take him because he was like this". Patient slumped over in wheelchair and dry heavying. Family states that he has not been quiet right for 2 weeks but this morning had trouble waking him up.

## 2018-02-20 ENCOUNTER — Inpatient Hospital Stay
Admit: 2018-02-20 | Discharge: 2018-02-20 | Disposition: A | Discharge status: Home / Self Care | Payer: Medicare Other | Attending: Family Medicine | Admitting: Family Medicine

## 2018-02-20 ENCOUNTER — Inpatient Hospital Stay: Payer: Medicare Other

## 2018-02-20 LAB — BLOOD CULTURE ID PANEL (REFLEXED)
Acinetobacter baumannii: NOT DETECTED
CANDIDA GLABRATA: NOT DETECTED
CANDIDA KRUSEI: NOT DETECTED
CANDIDA TROPICALIS: NOT DETECTED
Candida albicans: NOT DETECTED
Candida parapsilosis: NOT DETECTED
ENTEROBACTER CLOACAE COMPLEX: NOT DETECTED
ENTEROBACTERIACEAE SPECIES: NOT DETECTED
ESCHERICHIA COLI: NOT DETECTED
Enterococcus species: NOT DETECTED
Haemophilus influenzae: NOT DETECTED
KLEBSIELLA PNEUMONIAE: NOT DETECTED
Klebsiella oxytoca: NOT DETECTED
LISTERIA MONOCYTOGENES: NOT DETECTED
Methicillin resistance: NOT DETECTED
NEISSERIA MENINGITIDIS: NOT DETECTED
PROTEUS SPECIES: NOT DETECTED
Pseudomonas aeruginosa: NOT DETECTED
STREPTOCOCCUS AGALACTIAE: NOT DETECTED
STREPTOCOCCUS SPECIES: NOT DETECTED
Serratia marcescens: NOT DETECTED
Staphylococcus aureus (BCID): NOT DETECTED
Staphylococcus species: DETECTED — AB
Streptococcus pneumoniae: NOT DETECTED
Streptococcus pyogenes: NOT DETECTED

## 2018-02-20 LAB — BASIC METABOLIC PANEL
ANION GAP: 14 (ref 5–15)
BUN: 22 mg/dL (ref 8–23)
CHLORIDE: 98 mmol/L (ref 98–111)
CO2: 28 mmol/L (ref 22–32)
Calcium: 9.1 mg/dL (ref 8.9–10.3)
Creatinine, Ser: 8.04 mg/dL — ABNORMAL HIGH (ref 0.61–1.24)
GFR calc Af Amer: 7 mL/min — ABNORMAL LOW (ref >60)
GFR, EST NON AFRICAN AMERICAN: 6 mL/min — AB (ref >60)
GLUCOSE: 126 mg/dL — AB (ref 70–99)
POTASSIUM: 4.2 mmol/L (ref 3.5–5.1)
Sodium: 140 mmol/L (ref 135–145)

## 2018-02-20 LAB — LIPID PANEL
Cholesterol: 69 mg/dL (ref 0–200)
HDL: 31 mg/dL — ABNORMAL LOW (ref >40)
LDL CALC: 20 mg/dL (ref 0–99)
Total CHOL/HDL Ratio: 2.2 RATIO
Triglycerides: 88 mg/dL (ref <150)
VLDL: 18 mg/dL (ref 0–40)

## 2018-02-20 LAB — ECHOCARDIOGRAM COMPLETE: Weight: 2458.57 oz

## 2018-02-20 LAB — GLUCOSE, CAPILLARY
GLUCOSE-CAPILLARY: 103 mg/dL — AB (ref 70–99)
GLUCOSE-CAPILLARY: 115 mg/dL — AB (ref 70–99)
GLUCOSE-CAPILLARY: 83 mg/dL (ref 70–99)
Glucose-Capillary: 96 mg/dL (ref 70–99)
Glucose-Capillary: 82 mg/dL (ref 70–99)

## 2018-02-20 LAB — TROPONIN I
Troponin I: 0.07 ng/mL (ref <0.03)
Troponin I: 0.09 ng/mL (ref <0.03)

## 2018-02-20 LAB — RPR: RPR Ser Ql: NONREACTIVE

## 2018-02-20 LAB — LEVETIRACETAM LEVEL: Levetiracetam Lvl: 48.6 ug/mL — ABNORMAL HIGH (ref 10.0–40.0)

## 2018-02-20 LAB — HIV ANTIBODY (ROUTINE TESTING W REFLEX): HIV SCREEN 4TH GENERATION: NONREACTIVE

## 2018-02-20 MED ORDER — VANCOMYCIN HCL IN DEXTROSE 750-5 MG/150ML-% IV SOLN
750.0000 mg | INTRAVENOUS | Status: DC
  Administered 2018-02-21: 750 mg via INTRAVENOUS
  Filled 2018-02-20: qty 150

## 2018-02-20 MED ORDER — VANCOMYCIN HCL 10 G IV SOLR
1500.0000 mg | Freq: Once | INTRAVENOUS | Status: AC
  Administered 2018-02-20: 1500 mg via INTRAVENOUS
  Filled 2018-02-20: qty 1500

## 2018-02-20 MED ORDER — DEXTROSE-NACL 5-0.45 % IV SOLN
INTRAVENOUS | Status: DC
  Administered 2018-02-21: 09:00:00 via INTRAVENOUS

## 2018-02-20 MED ORDER — FAMOTIDINE IN NACL 20-0.9 MG/50ML-% IV SOLN
20.0000 mg | INTRAVENOUS | Status: DC
  Administered 2018-02-22: 20 mg via INTRAVENOUS
  Filled 2018-02-20 (×2): qty 50

## 2018-02-20 NOTE — Progress Notes (Signed)
OT Cancellation Note  Patient Details Name: [name] MRN: [id number] DOB: 09/01/1950   Cancelled Treatment:    Reason Eval/Treat Not Completed: Fatigue/lethargy limiting ability to participate(Per nursing report pt. is lethargic, and has decreased arousability. Pt. is not appropriate to actively engage in, or participate in an inital OT eval at this time. WIll reattempt on the next available treatment date.)  Harrel Carina, MS, OTR/L 02/20/2018, 12:05 PM

## 2018-02-20 NOTE — Progress Notes (Signed)
CCMD called to say the patient was running bigeminy with PVC.  Dr Jerelyn Charles notified.  Order also received for a palliative consult

## 2018-02-20 NOTE — Progress Notes (Signed)
PT Cancellation Note  Patient Details Name: [name] MRN: [id number] DOB: 26-Oct-1950   Cancelled Treatment:    Reason Eval/Treat Not Completed: Patient at procedure or test/unavailable;Other (comment)(Patient out of room for testing at this time. PT will follow up as able.)   Lieutenant Diego PT, DPT 9:29 AM,02/20/18 [phone]

## 2018-02-20 NOTE — Progress Notes (Signed)
SLP Cancellation Note  Patient Details Name: [name] MRN: [id number] DOB: 10/13/1950   Cancelled treatment:       Reason Eval/Treat Not Completed: Fatigue/lethargy limiting ability to participate;Patient not medically ready;Patient's level of consciousness(chart reviewed; consulted NSG re: pt's status this morning). NSG reported pt was not appropriate yet for BSE; reduced arousability. Pt is also leaving floor for test soon per NSG.  ST services will f/u this PM or in the morning w/ BSE when pt is appropriate. Recommend oral care w/ aspiration precautions. NSG agreed.     Orinda Kenner, MS, CCC-SLP Watson,Katherine 02/20/2018, 9:25 AM

## 2018-02-20 NOTE — Progress Notes (Signed)
Patient transported to US via bed

## 2018-02-20 NOTE — Progress Notes (Signed)
[place] results called to Dr Candiss Norse

## 2018-02-20 NOTE — Progress Notes (Signed)
Pharmacist - Prescriber Communication   Famotidine modified from 20 mg IV BID to 20 mg IV Q48H due to CrCl less than 30 mL/min (ESRD HD) and history of stomach ulcer/GERD. Please note H2RAs have also been associated with AMS which is chief complaint. Patient does not use antisecretory therapy prior to admission.  Betsy Rosello A. [place], [place].D., BCPS Clinical Pharmacist 02/20/18 13:05

## 2018-02-20 NOTE — Progress Notes (Signed)
Pharmacy Antibiotic Note  [name] is a 67 y.o. male admitted on 02/19/2018 with bacteremia.  Pharmacy has been consulted for vancomycin dosing.  Plan: Vancomycin 1500 mg IV x 1 load dose followed by vancomycin 750 mg IV Q-dialysis (Tuesday-Thursday-Saturday). [name] has been therapeutic on this regimen in the past. Pharmacy will continue to follow and adjust as needed to maintain trough 15 to 20 mcg/ml.   Weight: 152 lb 4.8 oz (69.1 kg)  Temp (24hrs), Avg:98.1 F (36.7 C), Min:97.7 F (36.5 C), Max:98.8 F (37.1 C)  Recent Labs  Lab 02/19/18 0700 02/19/18 0948 02/20/18 0454  WBC 10.6  --   --   CREATININE 12.89*  --  8.04*  LATICACIDVEN 3.4* 1.4  --     CrCl cannot be calculated (Unknown ideal weight.).    Allergies  Allergen Reactions  . Ivp Dye [Iodinated Diagnostic Agents] Other (See Comments)    Pt denied    Antimicrobials this admission: Azithromycin and ceftriaxone started 02/19/2018 Vancomycin added 02/20/2018  Dose adjustments this admission: Vanc adjusted for ESRD HD  Microbiology results: 9/5 BCx: 2 of 4 bottles (single set) with GPC and GPR   UCx:    Sputum:    MRSA PCR:   Thank you for allowing pharmacy to be a part of this patient's care.  [name], Pharm.D., BCPS Clinical Pharmacist 02/20/2018 8:02 AM

## 2018-02-20 NOTE — Progress Notes (Signed)
PT Cancellation Note  Patient Details Name: [name] MRN: [id number] DOB: 01-Feb-1951   Cancelled Treatment:    Reason Eval/Treat Not Completed: Other (comment)(Per nursing this AM and chart review, patient lethargic with minimal to no participation in care. PT will hold until patient is more medically ready/able to participate.)  Lieutenant Diego PT, DPT 1:47 PM,02/20/18 [phone]

## 2018-02-20 NOTE — Progress Notes (Signed)
[facility] [place] NAME: [name]    MR#:  [id number]  DATE OF BIRTH:  [birth year]  SUBJECTIVE:  CHIEF COMPLAINT:  No chief complaint on file. Patient remains encephalopathic, family at the bedside, findings of acute stroke, acute blood infection shared with the family with all questions answered, infectious disease to see, neurology input appreciated, case discussed with nephrology  REVIEW OF SYSTEMS:  CONSTITUTIONAL: No fever, fatigue or weakness.  EYES: No blurred or double vision.  EARS, NOSE, AND THROAT: No tinnitus or ear pain.  RESPIRATORY: No cough, shortness of breath, wheezing or hemoptysis.  CARDIOVASCULAR: No chest pain, orthopnea, edema.  GASTROINTESTINAL: No nausea, vomiting, diarrhea or abdominal pain.  GENITOURINARY: No dysuria, hematuria.  ENDOCRINE: No polyuria, nocturia,  HEMATOLOGY: No anemia, easy bruising or bleeding SKIN: No rash or lesion. MUSCULOSKELETAL: No joint pain or arthritis.   NEUROLOGIC: No tingling, numbness, weakness.  PSYCHIATRY: No anxiety or depression.   ROS  DRUG ALLERGIES:   Allergies  Allergen Reactions  . Ivp Dye [Iodinated Diagnostic Agents] Other (See Comments)    Pt denied    VITALS:  Blood pressure (!) 165/71, pulse 83, temperature 98.8 F (37.1 C), temperature source Oral, resp. rate 18, height 5\' 5"  (1.651 m), weight 69.1 kg, SpO2 100 %.  PHYSICAL EXAMINATION:  GENERAL:  67 y.o.-year-old patient lying in the bed with no acute distress.  EYES: Pupils equal, round, reactive to light and accommodation. No scleral icterus. Extraocular muscles intact.  HEENT: Head atraumatic, normocephalic. Oropharynx and nasopharynx clear.  NECK:  Supple, no jugular venous distention. No thyroid enlargement, no tenderness.  LUNGS: Normal breath sounds bilaterally, no wheezing, rales,rhonchi or crepitation. No use of accessory muscles of respiration.  CARDIOVASCULAR: S1, S2 normal. No murmurs, rubs,  or gallops.  ABDOMEN: Soft, nontender, nondistended. Bowel sounds present. No organomegaly or mass.  EXTREMITIES: No pedal edema, cyanosis, or clubbing.  NEUROLOGIC: Cranial nerves II through XII are intact. Muscle strength 5/5 in all extremities. Sensation intact. Gait not checked.  PSYCHIATRIC: The patient is alert and oriented x 3.  SKIN: No obvious rash, lesion, or ulcer.   Physical Exam LABORATORY PANEL:   CBC Recent Labs  Lab 02/19/18 0700  WBC 10.6  HGB 13.0  HCT 40.4  PLT 195   ------------------------------------------------------------------------------------------------------------------  Chemistries  Recent Labs  Lab 02/19/18 0700 02/20/18 0454  NA 137 140  K 3.8 4.2  CL 93* 98  CO2 27 28  GLUCOSE 161* 126*  BUN 43* 22  CREATININE 12.89* 8.04*  CALCIUM 10.3 9.1  AST 23  --   ALT 14  --   ALKPHOS 82  --   BILITOT 1.3*  --    ------------------------------------------------------------------------------------------------------------------  Cardiac Enzymes Recent Labs  Lab 02/19/18 2220 02/20/18 0454  TROPONINI 0.07* 0.09*   ------------------------------------------------------------------------------------------------------------------  RADIOLOGY:  Ct Head Wo Contrast  Result Date: 02/19/2018 CLINICAL DATA:  Altered level of consciousness EXAM: CT HEAD WITHOUT CONTRAST TECHNIQUE: Contiguous axial images were obtained from the base of the skull through the vertex without intravenous contrast. COMPARISON:  Head CT July 26, 2016; brain MRI July 26, 2016 FINDINGS: Brain: There is moderate diffuse atrophy. There is no intracranial mass, hemorrhage, extra-axial fluid collection, or midline shift. There is patchy small vessel disease in the centra semiovale bilaterally. There is a prior small infarct in the inferior left cerebellum. There is a prior small infarct in the right pons. Elsewhere gray-white compartments appear normal. No acute infarct evident.  Vascular: No hyperdense vessels are evident. There is calcification in each cavernous carotid artery region as well as in the distal left vertebral artery. Skull: The bony calvarium appears intact. Sinuses/Orbits: There is mucosal thickening in several ethmoid air cells. Other visualized paranasal sinuses are clear. Visualized orbits appear symmetric bilaterally. Other: Mastoid air cells are clear. IMPRESSION: Atrophy with patchy supratentorial small vessel disease. 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 No acute infarct evident. No mass or hemorrhage. There are foci of arterial vascular calcification. There is mucosal thickening in several ethmoid air cells. Electronically Signed   By: Lowella Grip III M.D.   On: 02/19/2018 07:59   Mr Brain Wo Contrast  Result Date: 02/19/2018 CLINICAL DATA:  Altered mental status for 2 weeks. History of end-stage renal disease on dialysis, hypertension, hypercholesterolemia and diabetes. EXAM: MRI HEAD WITHOUT CONTRAST TECHNIQUE: Multiplanar, multiecho pulse sequences of the brain and surrounding structures were obtained without intravenous contrast. COMPARISON:  CT HEAD February 19, 2018 and MRI of the head February 13, 2018 FINDINGS: Moderately motion degraded examination. INTRACRANIAL CONTENTS: 6 mm reduced diffusion RIGHT basal ganglia with low ADC values. Minimal T2 shine through LEFT fourth frontal periventricular white matter. Scattered chronic microhemorrhages better seen on today's SWI sequence. Cerebellar dentate nuclei and basal ganglia mineralization. Moderate to severe parenchymal brain volume loss. Patchy to confluent supratentorial white matter FLAIR T2 hyperintensities. Small area RIGHT occipital lobe and LEFT frontal encephalomalacia. Old small LEFT cerebellar infarcts. Old small pontine lacunar infarcts. Old bilateral basal ganglia lacunar infarcts. VASCULAR: Normal major intracranial vascular flow voids present at skull base. SKULL AND  UPPER CERVICAL SPINE: No abnormal sellar expansion. No suspicious calvarial bone marrow signal. Craniocervical junction maintained. SINUSES/ORBITS: The mastoid air-cells and included paranasal sinuses are well-aerated.The included ocular globes and orbital contents are non-suspicious. OTHER: None. IMPRESSION: 1. Moderately motion degraded examination. Acute RIGHT basal ganglia lacunar infarct. 2. Old small LEFT frontal RIGHT occipital lobe infarcts. Multiple old small supra and infratentorial infarcts. 3. Moderate chronic small vessel ischemic changes. 4. Moderate to severe parenchymal brain volume loss. Electronically Signed   By: Elon Alas M.D.   On: 02/19/2018 15:05   US Carotid Bilateral  Result Date: 02/20/2018 CLINICAL DATA:  Cerebrovascular accident. History of hypertension, hyperlipidemia and diabetes. Former smoker. History of end-stage renal disease, currently on dialysis. EXAM: BILATERAL CAROTID DUPLEX ULTRASOUND TECHNIQUE: Pearline Cables scale imaging, color Doppler and duplex ultrasound were performed of bilateral carotid and vertebral arteries in the neck. COMPARISON:  11/06/2014 FINDINGS: Criteria: Quantification of carotid stenosis is based on velocity parameters that correlate the residual internal carotid diameter with NASCET-based stenosis levels, using the diameter of the distal internal carotid lumen as the denominator for stenosis measurement. The following velocity measurements were obtained: RIGHT ICA:  107/23 cm/sec CCA:  11/65 cm/sec SYSTOLIC ICA/CCA RATIO:  1.3 ECA:  104 cm/sec LEFT ICA:  85/19 cm/sec CCA:  79/03 cm/sec SYSTOLIC ICA/CCA RATIO:  1.0 ECA:  87 cm/sec RIGHT CAROTID ARTERY: There is a minimal amount of echogenic plaque within the right carotid bulb (images 16 and 23), morphologically similar to the 10/2014 examination and again not resulting in elevated peak systolic velocities within the interrogated course of the right internal carotid artery to suggest a hemodynamically  significant stenosis. RIGHT VERTEBRAL ARTERY:  Antegrade flow LEFT CAROTID ARTERY: There is a minimal amount of echogenic plaque within the left carotid bulb (image 47 and 49), extending to involve the origin and proximal aspect the left internal carotid artery (  image 55), morphologically similar to the 10/2014 examination and again not resulting in elevated peak systolic velocities within the interrogated course the left internal carotid artery to suggest a hemodynamically significant stenosis. LEFT VERTEBRAL ARTERY:  Antegrade Flow IMPRESSION: Minimal amount of bilateral atherosclerotic plaque, left subjectively greater than right, morphologically similar to the 10/2014 examination, and again not resulting in a hemodynamically significant stenosis within either internal carotid artery. Electronically Signed   By: Sandi Mariscal M.D.   On: 02/20/2018 10:37   Dg Chest Port 1 View  Result Date: 02/19/2018 CLINICAL DATA:  Unresponsive.  Dialysis patient. EXAM: PORTABLE CHEST 1 VIEW COMPARISON:  07/12/2017 FINDINGS: Mild left lower lobe airspace disease unchanged. Right lung clear. Negative for edema or effusion. Prior median sternotomy. Heart size upper normal. Vascular stent in the left arm. IMPRESSION: Mild left lower lobe atelectasis/infiltrate, similar to the prior study. Electronically Signed   By: Franchot Gallo M.D.   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 Delayed presentation-patient was encephalopathic for approximately 2 weeks before [name] for evaluation from outpatient dialysis We will place on stroke protocol, neurology input greatly appreciated, aspirin, statin therapy, check lipids in the morning, check echocardiogram, carotid Dopplers, neurochecks per routine, aspiration/fall/skin care precautions while in house, and continue close medical monitoring  MRI of the brain noted   *Acute gram-positive cocci blood  infection Continue IV vancomycin, follow-up on cultures   *Acute probable left community-acquired pneumonia Continue empiric Rocephin/azithromycin, pneumonia protocol, follow-up on cultures  *Chronic ESRD on HD TTS Nephrology following for hemodialysis needs  *History of peptic ulcer disease PPI daily  *Acute accelerated hypertension Improved Continue current regiment  *History of seizure disorder Continue Keppra, seizure precautions   All the records are reviewed and case discussed with Care Management/Social Workerr. Management plans discussed with the patient, family and they are in agreement.  CODE STATUS: dnr  TOTAL TIME TAKING CARE OF THIS PATIENT: 40 minutes.     POSSIBLE D/C IN 2-3 DAYS, DEPENDING ON CLINICAL CONDITION.   Avel Peace Pharell Rolfson M.D on 02/20/2018   Between 7am to 6pm - Pager - [phone]  After 6pm go to [email or web address] - password [email or web address] [facility]  Office  [phone]  CC: Primary care physician; David Axe, MD  Note: This dictation was prepared with Dragon dictation along with smaller phrase technology. Any transcriptional errors that result from this process are unintentional.

## 2018-02-20 NOTE — Consult Note (Signed)
[facility] Cardiology  CARDIOLOGY CONSULT NOTE  Patient ID: [name] MRN: [id number] DOB/AGE: 66-Dec-1952 66 y.o.  Admit date: 02/19/2018 Referring Physician Salary Primary Physician Candiss Norse Primary Cardiologist Paraschos Reason for Consultation Elevated troponin  HPI: 67 year old male referred for evaluation of elevated troponin. There are no family members at the bedside at the time of this patient interview and exam; the patient is a poor historian due to encephalopathy. The patient's medical history was obtained from ER and hospitalist notes. The patient has a history of coronary artery disease, status post CABG x1 in 2017, NSTEMI with PCI 05/2015, ischemic cardiomyopathy with LVEF 93%, chronic systolic congestive heart failure, essential hypertension, type 2 diabetes, end-stage renal disease on chronic hemodialysis, status post renal transplant, failed transplant, and nephrectomy in 2015, and history of subdural hematoma in 2016.  The patient was at his routine dialysis treatment yesterday and the patient was noted to have altered mental status, slumped over in wheelchair and dry heaving, and was brought to the emergency department.  Per the ER note, the patient's wife noted intermittent confusion for the last 2 weeks.  In the ER the patient's systolic blood pressure was noted to be in the 190s. Admission labs notable for troponin 0.08, 0.07, 0.07, followed by 0.09, creatinine 12.89, BUN 43, GFR 4. The patient is unable to tell me if he has had chest pain or shortness of breath. Brain MRI revealed acute right basal ganglia lacunar infarct. Chest xray revealed mild left lower lobe atelectasis/infiltrate, though similar to prior study.    Review of systems unable to be completed due to patient's altered mental status.   Past Medical History:  Diagnosis Date  . Chronic diastolic congestive heart failure ([place])   . Chronic disease anemia   . ESRD (end stage renal disease) on dialysis ([place])    "[place];  [place]; [name]" (09/29/2014)  . GERD (gastroesophageal reflux disease)   . Gout   . High cholesterol   . History of blood transfusion    "related to anemia"  . History of stomach ulcers   . Hypertension   . Type II diabetes mellitus ([place])     Past Surgical History:  Procedure Laterality Date  . ARTERIOVENOUS GRAFT PLACEMENT Left ~ 1996  . CARDIAC CATHETERIZATION  09/29/2014   "[place]"  . CARDIAC CATHETERIZATION N/A 02/26/2016   Procedure: Left Heart Cath and Coronary Angiography Possible PCI;  Surgeon: [name], MD;  Location: [place] CV LAB;  Service: Cardiovascular;  Laterality: N/A;  . ENDOSCOPIC RETROGRADE CHOLANGIOPANCREATOGRAPHY (ERCP) WITH PROPOFOL N/A 09/17/2016   Procedure: ENDOSCOPIC RETROGRADE CHOLANGIOPANCREATOGRAPHY (ERCP) WITH PROPOFOL;  Surgeon: [name], MD;  Location: [facility] ENDOSCOPY;  Service: Endoscopy;  Laterality: N/A;  . ERCP N/A 09/10/2016   Procedure: ENDOSCOPIC RETROGRADE CHOLANGIOPANCREATOGRAPHY (ERCP);  Surgeon: [name], MD;  Location: [facility] ENDOSCOPY;  Service: Endoscopy;  Laterality: N/A;  . KIDNEY TRANSPLANT Right 2004  . NEPHRECTOMY TRANSPLANTED ORGAN  2015  . PERCUTANEOUS CORONARY STENT INTERVENTION (PCI-S) N/A 09/30/2014   Procedure: PERCUTANEOUS CORONARY STENT INTERVENTION (PCI-S);  Surgeon: [name], MD;  Location: [facility] CATH LAB;  Service: Cardiovascular;  Laterality: N/A;  . PERITONEAL CATHETER INSERTION  02/2014  . PERITONEAL CATHETER REMOVAL  08/2014  . THROMBECTOMY / ARTERIOVENOUS GRAFT REVISION  2015    Medications Prior to Admission  Medication Sig Dispense Refill Last Dose  . allopurinol (ZYLOPRIM) 100 MG tablet Take 100 mg by mouth daily.  10 07/11/2017 at 0800  . aspirin 81 MG EC tablet Take 81 mg by  mouth daily.  10 07/11/2017 at 0800  . atorvastatin (LIPITOR) 40 MG tablet Take 2 tablets (80 mg total) by mouth daily at 6 PM. 30 tablet 0 Not Taking at Unknown time  . calcium acetate (PHOSLO) 667 MG capsule Take 1,334 mg by  mouth 3 (three) times daily with meals.   07/11/2017 at 2000  . levETIRAcetam (KEPPRA) 1000 MG tablet Take 1 tablet (1,000 mg total) by mouth daily. (Patient taking differently: Take 750 mg by mouth daily. ) 60 tablet 1 07/11/2017 at 0800  . levETIRAcetam (KEPPRA) 250 MG tablet Take on the Days on Dialysis (Tues, Thurs, Sat) after Dialysis. (Patient taking differently: Take 250 mg by mouth daily. Take 250 mg on Dialysis days (Tues, Thurs, Sat) after Dialysis.) 60 tablet 1 07/10/2017 at 1500  . metoprolol succinate (TOPROL-XL) 25 MG 24 hr tablet Take 12.5-25 mg by mouth daily. Take 25 mg by mouth on non-dialysis days and 12.5 mg on dialysis days (Tuesday, Thursday and Saturday) after dialysis.   07/11/2017 at 0800  . mirtazapine (REMERON) 15 MG tablet Take 15 mg by mouth at bedtime.    07/11/2017 at 2000  . acetaminophen (TYLENOL) 325 MG tablet Take 2 tablets (650 mg total) by mouth every 6 (six) hours as needed for mild pain (or Fever >/= 101). 30 tablet 0 PRN at PRN  . clopidogrel (PLAVIX) 75 MG tablet Take 1 tablet (75 mg total) by mouth daily with breakfast. 30 tablet 11 07/11/2017 at 0800  . heparin 100-0.45 UNIT/ML-% infusion Inject 1,000 Units/hr into the vein continuous. (Patient not taking: Reported on 02/19/2018) 250 mL 0 Not Taking at Unknown time  . losartan (COZAAR) 25 MG tablet Take 1 tablet (25 mg total) by mouth daily. (Patient not taking: Reported on 10/23/2016) 30 tablet 2 Not Taking at Unknown time  . multivitamin (RENA-VIT) TABS tablet Take 1 tablet by mouth at bedtime. (Patient not taking: Reported on 09/26/2016) 30 tablet 3 Not Taking at Unknown time  . nitroGLYCERIN (NITROSTAT) 0.4 MG SL tablet Place 1 tablet (0.4 mg total) under the tongue every 5 (five) minutes x 3 doses as needed for chest pain. (Patient not taking: Reported on 09/26/2016) 25 tablet 12 Not Taking at Unknown time   Social History   Socioeconomic History  . Marital status: Married    Spouse name: Not on file  . Number of  children: Not on file  . Years of education: Not on file  . Highest education level: Not on file  Occupational History  . Not on file  Social Needs  . Financial resource strain: Not on file  . Food insecurity:    Worry: Not on file    Inability: Not on file  . Transportation needs:    Medical: Not on file    Non-medical: Not on file  Tobacco Use  . Smoking status: Former Smoker    Types: Cigarettes  . Smokeless tobacco: Never Used  . Tobacco comment: "quit smoking cigarettes in the 1980's"  Substance and Sexual Activity  . Alcohol use: No  . Drug use: No  . Sexual activity: Not on file  Lifestyle  . Physical activity:    Days per week: Not on file    Minutes per session: Not on file  . Stress: Not on file  Relationships  . Social connections:    Talks on phone: Not on file    Gets together: Not on file    Attends religious service: Not on file    Active  member of club or organization: Not on file    Attends meetings of clubs or organizations: Not on file    Relationship status: Not on file  . Intimate partner violence:    Fear of current or ex partner: Not on file    Emotionally abused: Not on file    Physically abused: Not on file    Forced sexual activity: Not on file  Other Topics Concern  . Not on file  Social History Narrative  . Not on file    Family History  Problem Relation Age of Onset  . Hypertension Mother   . Stroke Mother   . Hypertension Father   . Diabetes Mellitus II Father   . Asthma Father       Review of systems unable to be completed due to patient's altered mental status.     PHYSICAL EXAM  General: Chronically-ill appearing, lying in bed, obtunded HEENT:  Normocephalic and atramatic Neck:  No JVD.  Lungs: Normal effort of breathing on room air, no wheezing. Heart: HRRR . Normal S1 and S2 without gallops or murmurs.  Msk:  Gait not assessed. No obvious deformities. Extremities: No clubbing, cyanosis or edema.   Neuro: Obtunded.  Arousable, not following commands. Opens eyes when his name is called. Psych:  Obtunded, arousable   Labs:   Lab Results  Component Value Date   WBC 10.6 02/19/2018   HGB 13.0 02/19/2018   HCT 40.4 02/19/2018   MCV 75.4 (L) 02/19/2018   PLT 195 02/19/2018    Recent Labs  Lab 02/19/18 0700 02/20/18 0454  NA 137 140  K 3.8 4.2  CL 93* 98  CO2 27 28  BUN 43* 22  CREATININE 12.89* 8.04*  CALCIUM 10.3 9.1  PROT 8.5*  --   BILITOT 1.3*  --   ALKPHOS 82  --   ALT 14  --   AST 23  --   GLUCOSE 161* 126*   Lab Results  Component Value Date   CKTOTAL 42 (L) 09/27/2014   CKMB 0.5 09/27/2014   TROPONINI 0.09 (HH) 02/20/2018    Lab Results  Component Value Date   CHOL 115 09/30/2014   CHOL 103 08/06/2013   Lab Results  Component Value Date   HDL 30 (L) 09/30/2014   HDL 32 (L) 08/06/2013   Lab Results  Component Value Date   LDLCALC 69 09/30/2014   LDLCALC 33 08/06/2013   Lab Results  Component Value Date   TRIG 79 09/30/2014   TRIG 191 08/06/2013   Lab Results  Component Value Date   CHOLHDL 3.8 09/30/2014   No results found for: LDLDIRECT    Radiology: Ct Head Wo Contrast  Result Date: 02/19/2018 CLINICAL DATA:  Altered level of consciousness EXAM: CT HEAD WITHOUT CONTRAST TECHNIQUE: Contiguous axial images were obtained from the base of the skull through the vertex without intravenous contrast. COMPARISON:  Head CT July 26, 2016; brain MRI July 26, 2016 FINDINGS: Brain: There is moderate diffuse atrophy. There is no intracranial mass, hemorrhage, extra-axial fluid collection, or midline shift. There is patchy small vessel disease in the centra semiovale bilaterally. There is a prior small infarct in the inferior left cerebellum. There is a prior small infarct in the right pons. Elsewhere gray-white compartments appear normal. No acute infarct evident. Vascular: No hyperdense vessels are evident. There is calcification in each cavernous carotid artery region  as well as in the distal left vertebral artery. Skull: The bony calvarium appears intact. Sinuses/Orbits:  There is mucosal thickening in several ethmoid air cells. Other visualized paranasal sinuses are clear. Visualized orbits appear symmetric bilaterally. Other: Mastoid air cells are clear. IMPRESSION: Atrophy with patchy supratentorial small vessel disease. 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 No acute infarct evident. No mass or hemorrhage. There are foci of arterial vascular calcification. There is mucosal thickening in several ethmoid air cells. Electronically Signed   By: Lowella Grip III M.D.   On: 02/19/2018 07:59   Mr Brain Wo Contrast  Result Date: 02/19/2018 CLINICAL DATA:  Altered mental status for 2 weeks. History of end-stage renal disease on dialysis, hypertension, hypercholesterolemia and diabetes. EXAM: MRI HEAD WITHOUT CONTRAST TECHNIQUE: Multiplanar, multiecho pulse sequences of the brain and surrounding structures were obtained without intravenous contrast. COMPARISON:  CT HEAD February 19, 2018 and MRI of the head February 13, 2018 FINDINGS: Moderately motion degraded examination. INTRACRANIAL CONTENTS: 6 mm reduced diffusion RIGHT basal ganglia with low ADC values. Minimal T2 shine through LEFT fourth frontal periventricular white matter. Scattered chronic microhemorrhages better seen on today's SWI sequence. Cerebellar dentate nuclei and basal ganglia mineralization. Moderate to severe parenchymal brain volume loss. Patchy to confluent supratentorial white matter FLAIR T2 hyperintensities. Small area RIGHT occipital lobe and LEFT frontal encephalomalacia. Old small LEFT cerebellar infarcts. Old small pontine lacunar infarcts. Old bilateral basal ganglia lacunar infarcts. VASCULAR: Normal major intracranial vascular flow voids present at skull base. SKULL AND UPPER CERVICAL SPINE: No abnormal sellar expansion. No suspicious calvarial bone marrow signal.  Craniocervical junction maintained. SINUSES/ORBITS: The mastoid air-cells and included paranasal sinuses are well-aerated.The included ocular globes and orbital contents are non-suspicious. OTHER: None. IMPRESSION: 1. Moderately motion degraded examination. Acute RIGHT basal ganglia lacunar infarct. 2. Old small LEFT frontal RIGHT occipital lobe infarcts. Multiple old small supra and infratentorial infarcts. 3. Moderate chronic small vessel ischemic changes. 4. Moderate to severe parenchymal brain volume loss. Electronically Signed   By: Elon Alas M.D.   On: 02/19/2018 15:05   Mr Brain Wo Contrast  Result Date: 02/13/2018 CLINICAL DATA:  Initial evaluation for intermittent weakness and confusion for 2 weeks. EXAM: MRI HEAD WITHOUT CONTRAST TECHNIQUE: Multiplanar, multiecho pulse sequences of the brain and surrounding structures were obtained without intravenous contrast. COMPARISON:  Prior MRI from 07/26/2016. FINDINGS: Brain: Moderately advanced cerebral atrophy. Patchy and confluent T2/FLAIR hyperintensity within the periventricular and deep white matter both cerebral hemispheres most consistent with chronic small vessel ischemic disease, moderate nature. Small remote right occipital cortical infarct (series 6, image 17). Few small remote left cerebellar infarcts. No abnormal foci of restricted diffusion to suggest acute or subacute ischemia. Gray-white matter differentiation maintained. No other areas of remote cortical infarction. No acute intracranial hemorrhage. Few tiny chronic micro hemorrhages noted. No mass lesion, midline shift or mass effect. Mild ventricular prominence related global parenchymal volume loss of hydrocephalus. No extra-axial fluid collection. Normal pituitary gland. Vascular: Major intracranial vascular flow voids grossly maintained at the skull base. Skull and upper cervical spine: Craniocervical junction normal. Cervical spondylolysis noted at C5-6 without significant  stenosis. No focal marrow replacing lesion. Scalp soft tissues unremarkable. Sinuses/Orbits: Globes orbital soft tissues demonstrate no acute finding paranasal sinuses are clear. No mastoid effusion. Inner ear structures normal. Other: None. IMPRESSION: 1. No acute intracranial abnormality. 2. Small remote right occipital and left cerebellar infarcts. 3. Moderately advanced cerebral atrophy with chronic small vessel ischemic disease. Electronically Signed   By: Jeannine Boga M.D.   On: 02/13/2018 21:20   Dg Chest  Port 1 View  Result Date: 02/19/2018 CLINICAL DATA:  Unresponsive.  Dialysis patient. EXAM: PORTABLE CHEST 1 VIEW COMPARISON:  07/12/2017 FINDINGS: Mild left lower lobe airspace disease unchanged. Right lung clear. Negative for edema or effusion. Prior median sternotomy. Heart size upper normal. Vascular stent in the left arm. IMPRESSION: Mild left lower lobe atelectasis/infiltrate, similar to the prior study. Electronically Signed   By: Franchot Gallo M.D.   On: 02/19/2018 07:37    EKG: Sinus rhythm, rate 75 bpm, with nonspecific ST-T wave abnormalities  ASSESSMENT AND PLAN:  1. Altered mental status, acute encephalopathy, being treated for pneumonia 2. Acute basal ganglia lacunar infarct per brain MRI 3. End stage renal disease on dialysis 4. Elevated troponin, of  0.08, 0.07, 0.07, followed by 0.09, with no peak or trough, without appropriate trend, likely demand supply ischemia, end stage renal disease 5. Accelerated hypertension 6. Known coronary artery disease, status post CABG x1 2017, NSTEMI, and PCI ostial proximal LAD 05/2015  Plan: 1. Stroke protocol 2. 2D echocardiogram 3. Continue DAPT and statin 4. Continue losartan, metoprolol succinate, hydralazine 5. Telemetry monitoring  Signed: Clabe Seal  PA-C 02/20/2018, 8:47 AM

## 2018-02-20 NOTE — Progress Notes (Signed)
ID I was alerted by vigilanz system for MRSA in the blood and as per the hospital policy all MRSA bacteremia will have to be seen by ID. So I told Dr.Salary to place an ID consult. But on reviewing the blood culture BCID alert it is Coag neg staph and not MRSA. This is usually a skin contaminant. He is currently on vancomycin- can be discontinued once  ( repeat culture and also the first culture to finalize-) So  mandatory ID consult not needed- IF primary team deems consult is needed please call. Thanks [employer]

## 2018-02-20 NOTE — Progress Notes (Signed)
*  PRELIMINARY RESULTS* Echocardiogram 2D Echocardiogram has been performed.  [name] 02/20/2018, 11:02 AM

## 2018-02-20 NOTE — Progress Notes (Signed)
[facility], [place] 02/20/18  Subjective:   Patient remains critically ill Blood cultures are positive for staph species- MSSA-possibly contaminant  Objective:  Vital signs in last 24 hours:  Temp:  [96.2 F (35.7 C)-99 F (37.2 C)] 99 F (37.2 C) (09/06 1300) Pulse Rate:  [69-92] 80 (09/06 1218) Resp:  [16-30] 18 (09/06 0544) BP: (75-180)/(44-96) 153/68 (09/06 1218) SpO2:  [95 %-100 %] 100 % (09/06 1218) Weight:  [69.1 kg-69.7 kg] 69.1 kg (09/06 0759)  Weight change:  Filed Weights   02/19/18 1505 02/19/18 1842 02/20/18 0759  Weight: 69.6 kg 69.7 kg 69.1 kg    Intake/Output:    Intake/Output Summary (Last 24 hours) at 02/20/2018 1347 Last data filed at 02/20/2018 1322 Gross per 24 hour  Intake 1542.1 ml  Output 204 ml  Net 1338.1 ml     Physical Exam: General: NAD, laying in bed  HEENT Anicteric, moist mucus membranes  Neck supple  Pulm/lungs Coarse b/l  CVS/Heart No rub  Abdomen:  Soft, NT  Extremities: no edema  Neurologic:  Did not respond to voice or tactile stimuli  Skin: No rashes  Access: Left forearm AVG, some swelling around the graft       Basic Metabolic Panel:  Recent Labs  Lab 02/19/18 0700 02/20/18 0454  NA 137 140  K 3.8 4.2  CL 93* 98  CO2 27 28  GLUCOSE 161* 126*  BUN 43* 22  CREATININE 12.89* 8.04*  CALCIUM 10.3 9.1     CBC: Recent Labs  Lab 02/19/18 0700  WBC 10.6  NEUTROABS 4.3  HGB 13.0  HCT 40.4  MCV 75.4*  PLT 195      Lab Results  Component Value Date   HEPBSAG Negative 09/05/2016      Microbiology:  Recent Results (from the past 240 hour(s))  Culture, blood (routine x 2)     Status: None (Preliminary result)   Collection Time: 02/19/18  9:17 AM  Result Value Ref Range Status   Specimen Description BLOOD RIGHT ARM  Final   Special Requests   Final    BOTTLES DRAWN AEROBIC AND ANAEROBIC Blood Culture adequate volume   Culture   Final    NO GROWTH < 24 HOURS Performed at  [facility], [address] [place], [place] [address]    Report Status PENDING  Incomplete  Culture, blood (routine x 2)     Status: None (Preliminary result)   Collection Time: 02/19/18  9:48 AM  Result Value Ref Range Status   Specimen Description BLOOD RIGHT FINGER  Final   Special Requests   Final    BOTTLES DRAWN AEROBIC AND ANAEROBIC Blood Culture adequate volume   Culture  Setup Time   Final    Organism ID to follow IN BOTH AEROBIC AND ANAEROBIC BOTTLES GRAM POSITIVE COCCI IN CLUSTERS GRAM POSITIVE RODS CRITICAL RESULT CALLED TO, READ BACK BY AND VERIFIED WITH: C/MATT [place] @0220  02/20/18 FLC Performed at [facility], [address] [place], [place] [address]    Culture   Final    GRAM POSITIVE COCCI IN CLUSTERS GRAM POSITIVE RODS    Report Status PENDING  Incomplete  Blood Culture ID Panel (Reflexed)     Status: Abnormal   Collection Time: 02/19/18  9:48 AM  Result Value Ref Range Status   Enterococcus species NOT DETECTED NOT DETECTED Final   Listeria monocytogenes NOT DETECTED NOT DETECTED Final   Staphylococcus species DETECTED (A) NOT DETECTED Final  Comment: Methicillin (oxacillin) susceptible coagulase negative staphylococcus. Possible blood culture contaminant (unless isolated from more than one blood culture draw or clinical case suggests pathogenicity). No antibiotic treatment is indicated for blood  culture contaminants. CRITICAL RESULT CALLED TO, READ BACK BY AND VERIFIED WITH: C/MATT MCBANE @0220  02/20/18 FLC    Staphylococcus aureus NOT DETECTED NOT DETECTED Final   Methicillin resistance NOT DETECTED NOT DETECTED Final   Streptococcus species NOT DETECTED NOT DETECTED Final   Streptococcus agalactiae NOT DETECTED NOT DETECTED Final   Streptococcus pneumoniae NOT DETECTED NOT DETECTED Final   Streptococcus pyogenes NOT DETECTED NOT DETECTED Final   Acinetobacter baumannii NOT DETECTED NOT DETECTED Final   Enterobacteriaceae species  NOT DETECTED NOT DETECTED Final   Enterobacter cloacae complex NOT DETECTED NOT DETECTED Final   Escherichia coli NOT DETECTED NOT DETECTED Final   Klebsiella oxytoca NOT DETECTED NOT DETECTED Final   Klebsiella pneumoniae NOT DETECTED NOT DETECTED Final   Proteus species NOT DETECTED NOT DETECTED Final   Serratia marcescens NOT DETECTED NOT DETECTED Final   Haemophilus influenzae NOT DETECTED NOT DETECTED Final   Neisseria meningitidis NOT DETECTED NOT DETECTED Final   Pseudomonas aeruginosa NOT DETECTED NOT DETECTED Final   Candida albicans NOT DETECTED NOT DETECTED Final   Candida glabrata NOT DETECTED NOT DETECTED Final   Candida krusei NOT DETECTED NOT DETECTED Final   Candida parapsilosis NOT DETECTED NOT DETECTED Final   Candida tropicalis NOT DETECTED NOT DETECTED Final    Comment: Performed at [facility], [address] [place], [place] [address]  MRSA PCR Screening     Status: None   Collection Time: 02/19/18 12:36 PM  Result Value Ref Range Status   MRSA by PCR NEGATIVE NEGATIVE Final    Comment:        The GeneXpert MRSA Assay (FDA approved for NASAL specimens only), is one component of a comprehensive MRSA colonization surveillance program. It is not intended to diagnose MRSA infection nor to guide or monitor treatment for MRSA infections. Performed at [facility], [address] [place], [place] [address]     Coagulation Studies: Recent Labs    02/19/18 0700  LABPROT 12.4  INR 0.93    Urinalysis: No results for input(s): COLORURINE, LABSPEC, PHURINE, GLUCOSEU, HGBUR, BILIRUBINUR, KETONESUR, PROTEINUR, UROBILINOGEN, NITRITE, LEUKOCYTESUR in the last 72 hours.  Invalid input(s): APPERANCEUR    Imaging: Ct Head Wo Contrast  Result Date: 02/19/2018 CLINICAL DATA:  Altered level of consciousness EXAM: CT HEAD WITHOUT CONTRAST TECHNIQUE: Contiguous axial images were obtained from the base of the skull through the vertex without  intravenous contrast. COMPARISON:  Head CT July 26, 2016; brain MRI July 26, 2016 FINDINGS: Brain: There is moderate diffuse atrophy. There is no intracranial mass, hemorrhage, extra-axial fluid collection, or midline shift. There is patchy small vessel disease in the centra semiovale bilaterally. There is a prior small infarct in the inferior left cerebellum. There is a prior small infarct in the right pons. Elsewhere gray-white compartments appear normal. No acute infarct evident. Vascular: No hyperdense vessels are evident. There is calcification in each cavernous carotid artery region as well as in the distal left vertebral artery. Skull: The bony calvarium appears intact. Sinuses/Orbits: There is mucosal thickening in several ethmoid air cells. Other visualized paranasal sinuses are clear. Visualized orbits appear symmetric bilaterally. Other: Mastoid air cells are clear. IMPRESSION: Atrophy with patchy supratentorial small vessel disease. 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 No acute infarct evident. No  mass or hemorrhage. There are foci of arterial vascular calcification. There is mucosal thickening in several ethmoid air cells. Electronically Signed   By: Lowella Grip III M.D.   On: 02/19/2018 07:59   Mr Brain Wo Contrast  Result Date: 02/19/2018 CLINICAL DATA:  Altered mental status for 2 weeks. History of end-stage renal disease on dialysis, hypertension, hypercholesterolemia and diabetes. EXAM: MRI HEAD WITHOUT CONTRAST TECHNIQUE: Multiplanar, multiecho pulse sequences of the brain and surrounding structures were obtained without intravenous contrast. COMPARISON:  CT HEAD February 19, 2018 and MRI of the head February 13, 2018 FINDINGS: Moderately motion degraded examination. INTRACRANIAL CONTENTS: 6 mm reduced diffusion RIGHT basal ganglia with low ADC values. Minimal T2 shine through LEFT fourth frontal periventricular white matter. Scattered chronic microhemorrhages  better seen on today's SWI sequence. Cerebellar dentate nuclei and basal ganglia mineralization. Moderate to severe parenchymal brain volume loss. Patchy to confluent supratentorial white matter FLAIR T2 hyperintensities. Small area RIGHT occipital lobe and LEFT frontal encephalomalacia. Old small LEFT cerebellar infarcts. Old small pontine lacunar infarcts. Old bilateral basal ganglia lacunar infarcts. VASCULAR: Normal major intracranial vascular flow voids present at skull base. SKULL AND UPPER CERVICAL SPINE: No abnormal sellar expansion. No suspicious calvarial bone marrow signal. Craniocervical junction maintained. SINUSES/ORBITS: The mastoid air-cells and included paranasal sinuses are well-aerated.The included ocular globes and orbital contents are non-suspicious. OTHER: None. IMPRESSION: 1. Moderately motion degraded examination. Acute RIGHT basal ganglia lacunar infarct. 2. Old small LEFT frontal RIGHT occipital lobe infarcts. Multiple old small supra and infratentorial infarcts. 3. Moderate chronic small vessel ischemic changes. 4. Moderate to severe parenchymal brain volume loss. Electronically Signed   By: Elon Alas M.D.   On: 02/19/2018 15:05   US Carotid Bilateral  Result Date: 02/20/2018 CLINICAL DATA:  Cerebrovascular accident. History of hypertension, hyperlipidemia and diabetes. Former smoker. History of end-stage renal disease, currently on dialysis. EXAM: BILATERAL CAROTID DUPLEX ULTRASOUND TECHNIQUE: Pearline Cables scale imaging, color Doppler and duplex ultrasound were performed of bilateral carotid and vertebral arteries in the neck. COMPARISON:  11/06/2014 FINDINGS: Criteria: Quantification of carotid stenosis is based on velocity parameters that correlate the residual internal carotid diameter with NASCET-based stenosis levels, using the diameter of the distal internal carotid lumen as the denominator for stenosis measurement. The following velocity measurements were obtained: RIGHT ICA:   107/23 cm/sec CCA:  42/68 cm/sec SYSTOLIC ICA/CCA RATIO:  1.3 ECA:  104 cm/sec LEFT ICA:  85/19 cm/sec CCA:  34/19 cm/sec SYSTOLIC ICA/CCA RATIO:  1.0 ECA:  87 cm/sec RIGHT CAROTID ARTERY: There is a minimal amount of echogenic plaque within the right carotid bulb (images 16 and 23), morphologically similar to the 10/2014 examination and again not resulting in elevated peak systolic velocities within the interrogated course of the right internal carotid artery to suggest a hemodynamically significant stenosis. RIGHT VERTEBRAL ARTERY:  Antegrade flow LEFT CAROTID ARTERY: There is a minimal amount of echogenic plaque within the left carotid bulb (image 47 and 49), extending to involve the origin and proximal aspect the left internal carotid artery (image 55), morphologically similar to the 10/2014 examination and again not resulting in elevated peak systolic velocities within the interrogated course the left internal carotid artery to suggest a hemodynamically significant stenosis. LEFT VERTEBRAL ARTERY:  Antegrade Flow IMPRESSION: Minimal amount of bilateral atherosclerotic plaque, left subjectively greater than right, morphologically similar to the 10/2014 examination, and again not resulting in a hemodynamically significant stenosis within either internal carotid artery. Electronically Signed   By: Eldridge Abrahams.D.  On: 02/20/2018 10:37   Dg Chest Port 1 View  Result Date: 02/19/2018 CLINICAL DATA:  Unresponsive.  Dialysis patient. EXAM: PORTABLE CHEST 1 VIEW COMPARISON:  07/12/2017 FINDINGS: Mild left lower lobe airspace disease unchanged. Right lung clear. Negative for edema or effusion. Prior median sternotomy. Heart size upper normal. Vascular stent in the left arm. IMPRESSION: Mild left lower lobe atelectasis/infiltrate, similar to the prior study. Electronically Signed   By: Franchot Gallo M.D.   On: 02/19/2018 07:37     Medications:   . aspirin EC  81 mg Oral Daily  . atorvastatin  80 mg Oral  q1800  . calcium acetate  1,334 mg Oral TID WC  . Chlorhexidine Gluconate Cloth  6 each Topical Q0600  . clopidogrel  75 mg Oral Q breakfast  . heparin  5,000 Units Subcutaneous Q8H  . Influenza vac split quadrivalent PF  0.5 mL Intramuscular Tomorrow-1000  . insulin aspart  0-15 Units Subcutaneous TID WC  . insulin aspart  0-5 Units Subcutaneous QHS  . losartan  25 mg Oral Daily  . metoprolol succinate  12.5 mg Oral Q T,Th,Sat-1800  . metoprolol succinate  25 mg Oral Q M,W,F,Su-1800  . multivitamin  1 tablet Oral QHS  . sodium chloride flush  3 mL Intravenous Q12H      Assessment/ Plan:  67 y.o. 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   TTS [facility] [address]/ 70 kg/   1. ESRD -Received hemodialysis yesterday.  No acute indication today. - next HD on Saturday  2. AOCKD - Hgb 13.0, Hold EPO  3. SHPTH - monitor phos  4. Altered mental status - work up in progress - neurology following  5.  Swelling around AV graft Obtain ultrasound to evaluate for abscess or fluid collection      LOS: [place] 9/6/20191:47 PM  Victoria Vera, [place]  Note: This note was prepared with Dragon dictation. Any transcription errors are unintentional

## 2018-02-20 NOTE — Progress Notes (Signed)
Family Meeting Note  Advance Directive:yes  Today a meeting took place with the Patient/wife.  Patient is unable to participate due AP:TCKFWB capacity obtunded   The following clinical team members were present during this meeting:MD  The following were discussed:Patient's diagnosis: Acute stroke, pneumonia, acute blood infection, diarrhea, end-stage renal disease, Patient's progosis: Unable to determine and Goals for treatment: DNR  Additional follow-up to be provided: prn  Time spent during discussion:20 minutes  Gorden Harms, MD

## 2018-02-20 NOTE — Progress Notes (Signed)
PHARMACY - PHYSICIAN COMMUNICATION CRITICAL VALUE ALERT - BLOOD CULTURE IDENTIFICATION (BCID)  [name] is an 67 y.o. male who presented to [facility] on 02/19/2018 with a chief complaint of   Assessment:  1/4 Staph spp mecA(-) (include suspected source if known)  Name of physician (or Provider) Contacted: Willis  Current antibiotics: ceftriaxone/azithromycin for CAP  Changes to prescribed antibiotics recommended:  n/a  Results for orders placed or performed during the hospital encounter of 09/04/16  Blood Culture ID Panel (Reflexed) (Collected: 09/04/2016  9:01 PM)  Result Value Ref Range   Enterococcus species NOT DETECTED NOT DETECTED   Listeria monocytogenes NOT DETECTED NOT DETECTED   Staphylococcus species NOT DETECTED NOT DETECTED   Staphylococcus aureus NOT DETECTED NOT DETECTED   Streptococcus species NOT DETECTED NOT DETECTED   Streptococcus agalactiae NOT DETECTED NOT DETECTED   Streptococcus pneumoniae NOT DETECTED NOT DETECTED   Streptococcus pyogenes NOT DETECTED NOT DETECTED   Acinetobacter baumannii NOT DETECTED NOT DETECTED   Enterobacteriaceae species DETECTED (A) NOT DETECTED   Enterobacter cloacae complex NOT DETECTED NOT DETECTED   Escherichia coli DETECTED (A) NOT DETECTED   Klebsiella oxytoca NOT DETECTED NOT DETECTED   Klebsiella pneumoniae NOT DETECTED NOT DETECTED   Proteus species NOT DETECTED NOT DETECTED   Serratia marcescens NOT DETECTED NOT DETECTED   Carbapenem resistance NOT DETECTED NOT DETECTED   Haemophilus influenzae NOT DETECTED NOT DETECTED   Neisseria meningitidis NOT DETECTED NOT DETECTED   Pseudomonas aeruginosa NOT DETECTED NOT DETECTED   Candida albicans NOT DETECTED NOT DETECTED   Candida glabrata NOT DETECTED NOT DETECTED   Candida krusei NOT DETECTED NOT DETECTED   Candida parapsilosis NOT DETECTED NOT DETECTED   Candida tropicalis NOT DETECTED NOT DETECTED    [name] 02/20/2018  2:24 AM

## 2018-02-21 LAB — GLUCOSE, CAPILLARY
GLUCOSE-CAPILLARY: 84 mg/dL (ref 70–99)
GLUCOSE-CAPILLARY: 93 mg/dL (ref 70–99)
Glucose-Capillary: 78 mg/dL (ref 70–99)
Glucose-Capillary: 108 mg/dL — ABNORMAL HIGH (ref 70–99)

## 2018-02-21 NOTE — Progress Notes (Signed)
PT Cancellation Note  Patient Details Name: [name] MRN: [id number] DOB: Jun 03, 1951   Cancelled Treatment:    Reason Eval/Treat Not Completed: Other (comment);Fatigue/lethargy limiting ability to participate.  Lethartic and unable to hold eyes open but per nsg was able to be more awake earlier.  Will try again at another time.   Ramond Dial 02/21/2018, 9:31 AM   Mee Hives, PT MS Acute Rehab Dept. Number: [place]

## 2018-02-21 NOTE — Progress Notes (Signed)
HD Treatment Complete    02/21/18 1527  Vital Signs  Pulse Rate 91  Pulse Rate Source Monitor  Resp 16  BP Location Right Arm  BP Method Automatic  Patient Position (if appropriate) Lying  Oxygen Therapy  SpO2 100 %  O2 Device Room Air  During Hemodialysis Assessment  Blood Flow Rate (mL/min) 350 mL/min  Arterial Pressure (mmHg) -90 mmHg  Venous Pressure (mmHg) 220 mmHg  Transmembrane Pressure (mmHg) 60 mmHg  Ultrafiltration Rate (mL/min) 240 mL/min  Dialysate Flow Rate (mL/min) 800 ml/min  Conductivity: Machine  14.1  HD Safety Checks Performed Yes  Intra-Hemodialysis Comments Tolerated well;Tx completed (UF 1034)  Fistula / Graft Left Forearm Arteriovenous vein graft  No Placement Date or Time found.   Placed prior to admission: Yes  Orientation: Left  Access Location: Forearm  Access Type: Arteriovenous vein graft  Site Condition No complications  Fistula / Graft Assessment Present;Thrill;Bruit  Status Deaccessed  Drainage Description None

## 2018-02-21 NOTE — Progress Notes (Signed)
Pre HD Treatment    02/21/18 1131  Vital Signs  Temp 98.9 F (37.2 C)  Temp Source Oral  Pulse Rate 90  Pulse Rate Source Monitor  Resp 18  BP Location Right Arm  BP Method Automatic  Patient Position (if appropriate) Lying  Oxygen Therapy  SpO2 98 %  O2 Device Room Air  Pain Assessment  Pain Scale 0-10  Pain Score 0  Dialysis Weight  Weight 74.3 kg  Type of Weight Pre-Dialysis  Time-Out for Hemodialysis  What Procedure? HD  Pt Identifiers(min of two) First/Last Name;MRN/Account#  Correct Site? Yes  Correct Side? Yes  Correct Procedure? Yes  Consents Verified? Yes  Rad Studies Available? N/A  Safety Precautions Reviewed? Yes  Machine [employer] Number [id number]  Station Number 3  UF/Alarm Test Passed  Conductivity: Meter 14  Conductivity: Machine  14  pH 7.6  Reverse Osmosis Main  Normal Saline Lot Number [id number]  Dialyzer Lot Number [id number]  Disposable Set Lot Number [id number]  Machine Temperature 98.6 F (37 C)  Musician and Audible Yes  Blood Lines Intact and Secured Yes  Pre Treatment Patient Checks  Vascular access used during treatment Graft  Hepatitis B Surface Antigen Results Negative  Date Hepatitis B Surface Antigen Drawn 01/27/18  Hepatitis B Surface Antibody  (66)  Date Hepatitis B Surface Antibody Drawn 03/20/17  Hemodialysis Consent Verified Yes  Hemodialysis Standing Orders Initiated Yes  ECG (Telemetry) Monitor On Yes  Prime Ordered Normal Saline  Length of  DialysisTreatment -hour(s) 3 Hour(s)  Dialyzer Elisio 17H NR  Dialysate 3K, 2.5 Ca  Dialysis Anticoagulant None  Dialysate Flow Ordered 800  Blood Flow Rate Ordered 400 mL/min  Ultrafiltration Goal 1 Liters  Pre Treatment Labs Other (Comment)  Dialysis Blood Pressure Support Ordered Normal Saline  Education / Care Plan  Dialysis Education Provided Yes  Documented Education in Care Plan Yes  Fistula / Graft Left Forearm Arteriovenous vein graft  No Placement Date or  Time found.   Placed prior to admission: Yes  Orientation: Left  Access Location: Forearm  Access Type: Arteriovenous vein graft  Site Condition No complications  Fistula / Graft Assessment Present;Thrill;Bruit  Drainage Description None

## 2018-02-21 NOTE — Progress Notes (Signed)
Post HD Treatment  Patient tolerated treatment well. His net UF removed was 534. His BVP was 67.6. During treatment, patient's blood pressure began to drop and his BFR had to be decreased. MD aware. Patient was able to complete treatment at a BFR of 350. Report called and given to Cannon Kettle.     02/21/18 1530  Vital Signs  Temp 98.8 F (37.1 C)  Temp Source Oral  Pulse Rate (!) 45  Pulse Rate Source Monitor  Resp (!) 24  BP (!) 142/67  BP Location Right Arm  BP Method Automatic  Patient Position (if appropriate) Lying  Oxygen Therapy  SpO2 100 %  O2 Device Room Air  Dialysis Weight  Weight 74.1 kg  Type of Weight Post-Dialysis  Post-Hemodialysis Assessment  Rinseback Volume (mL) 250 mL  KECN 67.6 V  Dialyzer Clearance Lightly streaked  Duration of HD Treatment -hour(s) 3 hour(s)  Hemodialysis Intake (mL) 500 mL  UF Total -Machine (mL) 1034 mL  Net UF (mL) 534 mL  Tolerated HD Treatment Yes  AVG/AVF Arterial Site Held (minutes) 10 minutes  AVG/AVF Venous Site Held (minutes) 10 minutes  Fistula / Graft Left Forearm Arteriovenous vein graft  No Placement Date or Time found.   Placed prior to admission: Yes  Orientation: Left  Access Location: Forearm  Access Type: Arteriovenous vein graft  Site Condition No complications  Fistula / Graft Assessment Present;Thrill;Bruit  Drainage Description None

## 2018-02-21 NOTE — Evaluation (Addendum)
Clinical/Bedside Swallow Evaluation Patient Details  Name: [name] MRN: [id number] Date of Birth: 04-10-1951  Today's Date: 02/21/2018 Time: SLP Start Time (ACUTE ONLY): 0845 SLP Stop Time (ACUTE ONLY): 0945 SLP Time Calculation (min) (ACUTE ONLY): 60 min  Past Medical History:  Past Medical History:  Diagnosis Date  . Chronic diastolic congestive heart failure ([place])   . Chronic disease anemia   . ESRD (end stage renal disease) on dialysis ([place])    "[place]; [place]; [name]" (09/29/2014)  . GERD (gastroesophageal reflux disease)   . Gout   . High cholesterol   . History of blood transfusion    "related to anemia"  . History of stomach ulcers   . Hypertension   . Type II diabetes mellitus ([place])    Past Surgical History:  Past Surgical History:  Procedure Laterality Date  . ARTERIOVENOUS GRAFT PLACEMENT Left ~ 1996  . CARDIAC CATHETERIZATION  09/29/2014   "[place]"  . CARDIAC CATHETERIZATION N/A 02/26/2016   Procedure: Left Heart Cath and Coronary Angiography Possible PCI;  Surgeon: [name], MD;  Location: [facility];  Service: Cardiovascular;  Laterality: N/A;  . ENDOSCOPIC RETROGRADE CHOLANGIOPANCREATOGRAPHY (ERCP) WITH PROPOFOL N/A 09/17/2016   Procedure: ENDOSCOPIC RETROGRADE CHOLANGIOPANCREATOGRAPHY (ERCP) WITH PROPOFOL;  Surgeon: [name], MD;  Location: [facility] ENDOSCOPY;  Service: Endoscopy;  Laterality: N/A;  . ERCP N/A 09/10/2016   Procedure: ENDOSCOPIC RETROGRADE CHOLANGIOPANCREATOGRAPHY (ERCP);  Surgeon: [name], MD;  Location: [facility] ENDOSCOPY;  Service: Endoscopy;  Laterality: N/A;  . KIDNEY TRANSPLANT Right 2004  . NEPHRECTOMY TRANSPLANTED ORGAN  2015  . PERCUTANEOUS CORONARY STENT INTERVENTION (PCI-S) N/A 09/30/2014   Procedure: PERCUTANEOUS CORONARY STENT INTERVENTION (PCI-S);  Surgeon: [name], MD;  Location: [facility] CATH LAB;  Service: Cardiovascular;  Laterality: N/A;  . PERITONEAL CATHETER INSERTION  02/2014  . PERITONEAL CATHETER REMOVAL   08/2014  . THROMBECTOMY / ARTERIOVENOUS GRAFT REVISION  2015   HPI:  Pt is a 67 y.o. 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), ERSD, w/ HD, who is presenting from hemodialysis prior to receiving dialysis for altered mental status, per wife the patient has been altered mentally for 2 weeks, unable to walk, lying in bed, limited verbal output, in the emergency room patient was found to have blood pressure in the 190s, normal blood gas, creatinine 12, CT noted for old strokes/no acute process, chest x-ray noted for questionable left pneumonia, troponin 0.08, CBC unimpressive, patient is DNR and a poor historian due to encephalopathy/obtundation, patient now be admitted for acute/subacute encephalopathy, acute probable left community-acquired pneumonia, and end-stage renal disease. Pt has been NPO for ~2-3 days d/t declined Cognitive status and alertness.    Assessment / Plan / Recommendation Clinical Impression  Pt appears to present min increased alertness w/ a more awake state. He appeared to present w/ oropharyngeal phase dysphagia suspect impacted by declined Cognitive status and orientation to the tasks of swallowing. Pt did respond adequately to verbal/tactile/visual cues during po trial presentation. He consumed trials of ice chips, Nectar consistency liquids, and purees fed to him w/ no immediate, overt s/s of aspiration; no decline in respiratory status w/ trials. However, suspect a delay in pharygneal swallowing initiation, and pt exhibited oral phase deficits. Oral phase impacted at times by pt's decreased awareness to tasks. Pt exhibited oral holding for 45+ seconds intermittently but did respond to the cues as stated. Oral clearing was achieived given time b/t trials.  OM exam appeared grossly wfl for lingual/labial movements during bolus manipulation and  transfer/clearing. He required total feeding support. Recommend a dysphagia level 1 (PUREE) w/ NECTAR liquids at this time; aspiration precautions; feeding support; Pills in Puree - Crushed as needed. ST services will f/u w/ trials to upgrade as appropriate.  SLP Visit Diagnosis: Dysphagia, oropharyngeal phase (R13.12)    Aspiration Risk  Mild aspiration risk;Moderate aspiration risk;Risk for inadequate nutrition/hydration    Diet Recommendation  Dysphagia level 1 (PUREE) w/ NECTAR consistency liquids; aspiration precautions; feeding support at meals giving cues as needed   Medication Administration: Crushed with puree(as needed for safer swallowing )    Other  Recommendations Recommended Consults: (Dietician f/u) Oral Care Recommendations: Oral care BID;Staff/trained caregiver to provide oral care Other Recommendations: Order thickener from pharmacy;Prohibited food (jello, ice cream, thin soups);Remove water pitcher;Have oral suction available   Follow up Recommendations Skilled Nursing facility(TBD)      Frequency and Duration min 3x week  2 weeks       Prognosis Prognosis for Safe Diet Advancement: Fair Barriers to Reach Goals: Cognitive deficits;Severity of deficits      Swallow Study   General Date of Onset: 02/19/18 HPI: Pt is a 67 y.o. male with a known history of per below presenting from hemodialysis prior to receiving dialysis for altered mental status, per wife the patient has been altered mentally for 2 weeks, unable to walk, lying in bed, limited verbal output, in the emergency room patient was found to have blood pressure in the 190s, normal blood gas, creatinine 12, CT noted for old strokes/no acute process, chest x-ray noted for questionable left pneumonia, troponin 0.08, CBC unimpressive, patient is DNR and a poor historian due to encephalopathy/obtundation, patient now be admitted for acute/subacute encephalopathy, acute probable left community-acquired pneumonia, and  end-stage renal disease. Pt has been NPO for ~2-3 days d/t declined Cognitive status and alertness.  Type of Study: Bedside Swallow Evaluation Previous Swallow Assessment: none reported Diet Prior to this Study: NPO(recommended NPO 2 days ago to declined status) Temperature Spikes Noted: No(wbc 10.6) Respiratory Status: Room air History of Recent Intubation: No Behavior/Cognition: Cooperative;Pleasant mood;Confused;Distractible;Requires cueing;Doesn't follow directions(Awake) Oral Cavity Assessment: Dry(sticky) Oral Care Completed by SLP: Recent completion by staff Oral Cavity - Dentition: Adequate natural dentition Vision: (n/a) Self-Feeding Abilities: Total assist Patient Positioning: Upright in bed(needed positioning) Baseline Vocal Quality: Low vocal intensity(adequate) Volitional Cough: Cognitively unable to elicit Volitional Swallow: Unable to elicit    Oral/Motor/Sensory Function Overall Oral Motor/Sensory Function: (appeared grossly WFL w/ bolus management/clearing)   Ice Chips Ice chips: Impaired Presentation: Spoon(fed; 3 trials) Oral Phase Impairments: Poor awareness of bolus Oral Phase Functional Implications: Oral holding(decreased manipulation of chips) Pharyngeal Phase Impairments: Suspected delayed Swallow   Thin Liquid Thin Liquid: Not tested    Nectar Thick Nectar Thick Liquid: Impaired Presentation: Cup;Spoon;Straw(fed; ~3 ozs total) Oral Phase Impairments: Poor awareness of bolus Oral phase functional implications: Oral holding;Prolonged oral transit Pharyngeal Phase Impairments: (none) Other Comments: verbal/tactile/visual cues given intermittently to encourage swallowing   Honey Thick Honey Thick Liquid: Not tested   Puree Puree: Impaired Presentation: Spoon(fed; 10 trials) Oral Phase Impairments: Poor awareness of bolus Oral Phase Functional Implications: Prolonged oral transit;Oral holding Pharyngeal Phase Impairments: Suspected delayed Swallow Other  Comments: again, cues given   Solid     Solid: Not tested       Orinda Kenner, MS, CCC-SLP Joan Avetisyan 02/21/2018,12:02 PM

## 2018-02-21 NOTE — Progress Notes (Signed)
HD Pre Assessment    02/21/18 1140  Neurological  Level of Consciousness Alert  Orientation Level Oriented to person  Respiratory  Respiratory Pattern Regular;Unlabored;Symmetrical  Chest Assessment Chest expansion symmetrical  Bilateral Breath Sounds Diminished  Cardiac  Pulse Regular  Heart Sounds S1, S2  Jugular Venous Distention (JVD) No  ECG Monitor Yes  Cardiac Rhythm NSR  Ectopy Unifocal PVC's  Ectopy Frequency Frequent  Antiarrhythmic device No  Vascular  R Radial Pulse +2  L Radial Pulse +2  R Dorsalis Pedis Pulse +2  L Dorsalis Pedis Pulse +2  Edema Generalized  Generalized Edema +1  Integumentary  Integumentary (WDL) X  Skin Color Appropriate for ethnicity  Skin Condition Dry  Skin Integrity Ecchymosis  Ecchymosis Location Arm  Ecchymosis Location Orientation Left;Lower  Musculoskeletal  Musculoskeletal (WDL) X  Generalized Weakness Yes  GU Assessment  Genitourinary (WDL) X (HD pt)  Psychosocial  Psychosocial (WDL) WDL

## 2018-02-21 NOTE — Progress Notes (Signed)
PT Cancellation Note  Patient Details Name: [name] MRN: [id number] DOB: [birth year]   Cancelled Treatment:    Reason Eval/Treat Not Completed: Patient at procedure or test/unavailable.   Will try again in the AM.   Ramond Dial 02/21/2018, 12:54 PM   Mee Hives, PT MS Acute Rehab Dept. Number: [place]

## 2018-02-21 NOTE — Progress Notes (Signed)
HD Post Assessment    02/21/18 1530  Neurological  Level of Consciousness Alert  Orientation Level Oriented to person;Disoriented to place;Disoriented to time;Disoriented to situation  Respiratory  Respiratory Pattern Regular;Labored;Symmetrical  Chest Assessment Chest expansion symmetrical  Bilateral Breath Sounds Diminished  Cardiac  Pulse Regular  Heart Sounds S1, S2  Jugular Venous Distention (JVD) No  ECG Monitor Yes  Cardiac Rhythm NSR  Ectopy Unifocal PVC's  Ectopy Frequency Occasional  Antiarrhythmic device No  Vascular  R Radial Pulse +2  L Radial Pulse +2  R Dorsalis Pedis Pulse +2  L Dorsalis Pedis Pulse +2  Edema Generalized  Generalized Edema +1  Integumentary  Integumentary (WDL) X  Skin Color Appropriate for ethnicity  Skin Condition Dry  Skin Integrity Ecchymosis  Ecchymosis Location Arm  Ecchymosis Location Orientation Left;Lower  Musculoskeletal  Musculoskeletal (WDL) X  Generalized Weakness Yes  GU Assessment  Genitourinary (WDL) X (HD pt)  Psychosocial  Psychosocial (WDL) WDL

## 2018-02-21 NOTE — Evaluation (Signed)
Occupational Therapy Evaluation Patient Details Name: [name] MRN: [id number] DOB: 1950-11-12 Today's Date: 02/21/2018    History of Present Illness Pt. is a 67 y.o. male who presented to the ER from hemodialysis with altered mental status. Pt. was admitted for encephalopathy, acute probable left community-acquired pneumonia, and end-stage renal disease due for hemodialysis today   Clinical Impression   Pt. presents with lethargy, weakness, limited activity tolerance, and limited functional mobility which limits the ability to complete basic ADL and IADL functioning. Pt. resides at home with family. Pt. required assist with all ADLs, IADLs, meal preparation, medication management, and transportation. Pt. required total assist for positioning secondary to leaning to the left. Pt. family education was provided about pt. ROM, positioning, pt. functional status, and lethargy. Pt. could benefit from OT services for ADL training, UE ROM, and pt. family education about positioning. Pt. would benefit from SNF level of care upon discharge.     Follow Up Recommendations  SNF    Equipment Recommendations       Recommendations for Other Services       Precautions / Restrictions Restrictions Weight Bearing Restrictions: No      Mobility Bed Mobility    Total assist              Transfers    Deferred                                                             ADL either performed or assessed with clinical judgement   ADL Overall ADL's : Needs assistance/impaired Eating/Feeding: Total assistance   Grooming: Total assistance   Upper Body Bathing: Total assistance   Lower Body Bathing: Total assistance   Upper Body Dressing : Total assistance   Lower Body Dressing: Total assistance   Toilet Transfer: Total assistance   Toileting- Clothing Manipulation and Hygiene: Total assistance               Vision Baseline Vision/History:  (TBA) Patient Visual Report: (TBA)       Perception     Praxis      Pertinent Vitals/Pain Pain Assessment: No/denies pain     Hand Dominance     Extremity/Trunk Assessment Upper Extremity Assessment Upper Extremity Assessment: Generalized weakness           Communication Communication Communication: (TBA)   Cognition Arousal/Alertness: Lethargic   Overall Cognitive Status: Difficult to assess                                     General Comments       Exercises     Shoulder Instructions      Home Living Family/patient expects to be discharged to:: Private residence Living Arrangements: Spouse/significant other;Children Available Help at Discharge: Family Type of Home: House Home Access: Stairs to enter Technical brewer of Steps: 4   Home Layout: One level     Bathroom Shower/Tub: Walk-in shower         Home Equipment: Environmental consultant - 2 wheels;Shower seat;Wheelchair - manual          Prior Functioning/Environment          Comments: Pt. required assist from family for basic ADL tasks, meal preparation,  medication management, and transportation.        OT Problem List: Decreased strength;Decreased range of motion;Impaired UE functional use;Decreased knowledge of use of DME or AE;Decreased activity tolerance;Decreased cognition      OT Treatment/Interventions: Self-care/ADL training;Therapeutic exercise;Patient/family education;Therapeutic activities;DME and/or AE instruction    OT Goals(Current goals can be found in the care plan section) Acute Rehab OT Goals OT Goal Formulation: Patient unable to participate in goal setting  OT Frequency: Min 1X/week   Barriers to D/C:            Co-evaluation              AM-PAC PT "6 Clicks" Daily Activity     Outcome Measure Help from another person eating meals?: Total Help from another person taking care of personal grooming?: Total Help from another person toileting, which  includes using toliet, bedpan, or urinal?: Total Help from another person bathing (including washing, rinsing, drying)?: Total Help from another person to put on and taking off regular upper body clothing?: Total Help from another person to put on and taking off regular lower body clothing?: Total 6 Click Score: 6   End of Session    Activity Tolerance: Patient limited by lethargy;Patient limited by fatigue Patient left: in bed;with family/visitor present;with call bell/phone within reach;with bed alarm set                   Time: 1000-1015 OT Time Calculation (min): 15 min Charges:  OT General Charges $OT Visit: 1 Visit OT Evaluation $OT Eval Low Complexity: 1 Low  Harrel Carina, MS, OTR/L   Harrel Carina 02/21/2018, 1:16 PM

## 2018-02-21 NOTE — Progress Notes (Signed)
HD Treatment Initiated    02/21/18 1156  Vital Signs  Pulse Rate 92  Resp 17  Oxygen Therapy  SpO2 97 %  O2 Device Room Air  During Hemodialysis Assessment  Blood Flow Rate (mL/min) 400 mL/min  Arterial Pressure (mmHg) -150 mmHg  Venous Pressure (mmHg) 220 mmHg  Transmembrane Pressure (mmHg) 70 mmHg  Ultrafiltration Rate (mL/min) 500 mL/min  Dialysate Flow Rate (mL/min) 800 ml/min  Conductivity: Machine  14.1  HD Safety Checks Performed Yes  Dialysis Fluid Bolus Normal Saline  Bolus Amount (mL) 250 mL  Intra-Hemodialysis Comments Tx initiated  Fistula / Graft Left Forearm Arteriovenous vein graft  No Placement Date or Time found.   Placed prior to admission: Yes  Orientation: Left  Access Location: Forearm  Access Type: Arteriovenous vein graft  Status Accessed  Needle Size 15g

## 2018-02-21 NOTE — Progress Notes (Signed)
[facility], [place] 02/21/18  Subjective:   Patient remains critically ill Blood cultures are positive for staph species (1/4)- MSSA-possibly contaminant More alert today Able to answer yes/no No nausea or vomiting. Able to drink some liquid when prompted  Objective:  Vital signs in last 24 hours:  Temp:  [96.2 F (35.7 C)-99 F (37.2 C)] 98.6 F (37 C) (09/07 0519) Pulse Rate:  [71-84] 84 (09/07 0519) Resp:  [16-18] 18 (09/07 0519) BP: (153-187)/(68-86) 187/86 (09/07 0519) SpO2:  [100 %] 100 % (09/06 2038) Weight:  [71.7 kg] 71.7 kg (09/07 0500)  Weight change: -0.517 kg Filed Weights   02/19/18 1842 02/20/18 0759 02/21/18 0500  Weight: 69.7 kg 69.1 kg 71.7 kg    Intake/Output:    Intake/Output Summary (Last 24 hours) at 02/21/2018 1051 Last data filed at 02/21/2018 1042 Gross per 24 hour  Intake 1280.34 ml  Output 0 ml  Net 1280.34 ml     Physical Exam: General: NAD, laying in bed  HEENT Anicteric, moist mucus membranes  Neck supple  Pulm/lungs Coarse b/l  CVS/Heart No rub  Abdomen:  Soft, NT  Extremities: no edema  Neurologic: Able to follow a few basic commands  Skin: No rashes  Access: Left forearm AVG, some swelling around the graft       Basic Metabolic Panel:  Recent Labs  Lab 02/19/18 0700 02/20/18 0454  NA 137 140  K 3.8 4.2  CL 93* 98  CO2 27 28  GLUCOSE 161* 126*  BUN 43* 22  CREATININE 12.89* 8.04*  CALCIUM 10.3 9.1     CBC: Recent Labs  Lab 02/19/18 0700  WBC 10.6  NEUTROABS 4.3  HGB 13.0  HCT 40.4  MCV 75.4*  PLT 195      Lab Results  Component Value Date   HEPBSAG Negative 09/05/2016      Microbiology:  Recent Results (from the past 240 hour(s))  Culture, blood (routine x 2)     Status: None (Preliminary result)   Collection Time: 02/19/18  9:17 AM  Result Value Ref Range Status   Specimen Description BLOOD RIGHT ARM  Final   Special Requests   Final    BOTTLES DRAWN AEROBIC  AND ANAEROBIC Blood Culture adequate volume   Culture   Final    NO GROWTH 2 DAYS Performed at [facility], [address], [place], [place] [address]    Report Status PENDING  Incomplete  Culture, blood (routine x 2)     Status: None (Preliminary result)   Collection Time: 02/19/18  9:48 AM  Result Value Ref Range Status   Specimen Description   Final    BLOOD RIGHT FINGER Performed at [facility], [address], [place], [place] [address]    Special Requests   Final    BOTTLES DRAWN AEROBIC AND ANAEROBIC Blood Culture adequate volume Performed at [facility] [address] [place], [place] [address]    Culture  Setup Time   Final    IN BOTH AEROBIC AND ANAEROBIC BOTTLES GRAM POSITIVE COCCI IN CLUSTERS GRAM POSITIVE RODS CRITICAL RESULT CALLED TO, READ BACK BY AND VERIFIED WITH: C/MATT [facility] @0220  02/20/18 FLC Performed at [facility], [place] [address] [place], [place] [address]    Culture   Final    GRAM POSITIVE COCCI IN CLUSTERS GRAM POSITIVE RODS    Report Status PENDING  Incomplete  Blood Culture ID Panel (Reflexed)     Status: Abnormal  Collection Time: 02/19/18  9:48 AM  Result Value Ref Range Status   Enterococcus species NOT DETECTED NOT DETECTED Final   Listeria monocytogenes NOT DETECTED NOT DETECTED Final   Staphylococcus species DETECTED (A) NOT DETECTED Final    Comment: Methicillin (oxacillin) susceptible coagulase negative staphylococcus. Possible blood culture contaminant (unless isolated from more than one blood culture draw or clinical case suggests pathogenicity). No antibiotic treatment is indicated for blood  culture contaminants. CRITICAL RESULT CALLED TO, READ BACK BY AND VERIFIED WITH: C/MATT [facility] @0220  02/20/18 FLC    Staphylococcus aureus NOT DETECTED NOT DETECTED Final   Methicillin resistance NOT DETECTED NOT DETECTED Final   Streptococcus species NOT DETECTED NOT DETECTED Final   Streptococcus  agalactiae NOT DETECTED NOT DETECTED Final   Streptococcus pneumoniae NOT DETECTED NOT DETECTED Final   Streptococcus pyogenes NOT DETECTED NOT DETECTED Final   Acinetobacter baumannii NOT DETECTED NOT DETECTED Final   Enterobacteriaceae species NOT DETECTED NOT DETECTED Final   Enterobacter cloacae complex NOT DETECTED NOT DETECTED Final   Escherichia coli NOT DETECTED NOT DETECTED Final   Klebsiella oxytoca NOT DETECTED NOT DETECTED Final   Klebsiella pneumoniae NOT DETECTED NOT DETECTED Final   Proteus species NOT DETECTED NOT DETECTED Final   Serratia marcescens NOT DETECTED NOT DETECTED Final   Haemophilus influenzae NOT DETECTED NOT DETECTED Final   Neisseria meningitidis NOT DETECTED NOT DETECTED Final   Pseudomonas aeruginosa NOT DETECTED NOT DETECTED Final   Candida albicans NOT DETECTED NOT DETECTED Final   Candida glabrata NOT DETECTED NOT DETECTED Final   Candida krusei NOT DETECTED NOT DETECTED Final   Candida parapsilosis NOT DETECTED NOT DETECTED Final   Candida tropicalis NOT DETECTED NOT DETECTED Final    Comment: Performed at [facility], [address] [place], [place] [address]  MRSA PCR Screening     Status: None   Collection Time: 02/19/18 12:36 PM  Result Value Ref Range Status   MRSA by PCR NEGATIVE NEGATIVE Final    Comment:        The GeneXpert MRSA Assay (FDA approved for NASAL specimens only), is one component of a comprehensive MRSA colonization surveillance program. It is not intended to diagnose MRSA infection nor to guide or monitor treatment for MRSA infections. Performed at [facility], [address] [place], [place] [address]   CULTURE, BLOOD (ROUTINE X 2) w Reflex to ID Panel     Status: None (Preliminary result)   Collection Time: 02/20/18 11:27 PM  Result Value Ref Range Status   Specimen Description BLOOD RIGHT HAND  Final   Special Requests   Final    BOTTLES DRAWN AEROBIC AND ANAEROBIC Blood Culture results may  not be optimal due to an excessive volume of blood received in culture bottles   Culture   Final    NO GROWTH < 12 HOURS Performed at [facility], [address], [place], [place] [address]    Report Status PENDING  Incomplete  CULTURE, BLOOD (ROUTINE X 2) w Reflex to ID Panel     Status: None (Preliminary result)   Collection Time: 02/20/18 11:39 PM  Result Value Ref Range Status   Specimen Description BLOOD RIGHT ANTECUBITAL  Final   Special Requests   Final    BOTTLES DRAWN AEROBIC AND ANAEROBIC Blood Culture results may not be optimal due to an excessive volume of blood received in culture bottles   Culture   Final    NO GROWTH < 12 HOURS Performed at  [facility], [address], [place], [place] [address]    Report Status PENDING  Incomplete    Coagulation Studies: Recent Labs    02/19/18 0700  LABPROT 12.4  INR 0.93    Urinalysis: No results for input(s): COLORURINE, LABSPEC, PHURINE, GLUCOSEU, HGBUR, BILIRUBINUR, KETONESUR, PROTEINUR, UROBILINOGEN, NITRITE, LEUKOCYTESUR in the last 72 hours.  Invalid input(s): APPERANCEUR    Imaging: Mr Brain Wo Contrast  Result Date: 02/19/2018 CLINICAL DATA:  Altered mental status for 2 weeks. History of end-stage renal disease on dialysis, hypertension, hypercholesterolemia and diabetes. EXAM: MRI HEAD WITHOUT CONTRAST TECHNIQUE: Multiplanar, multiecho pulse sequences of the brain and surrounding structures were obtained without intravenous contrast. COMPARISON:  CT HEAD February 19, 2018 and MRI of the head February 13, 2018 FINDINGS: Moderately motion degraded examination. INTRACRANIAL CONTENTS: 6 mm reduced diffusion RIGHT basal ganglia with low ADC values. Minimal T2 shine through LEFT fourth frontal periventricular white matter. Scattered chronic microhemorrhages better seen on today's SWI sequence. Cerebellar dentate nuclei and basal ganglia mineralization. Moderate to severe parenchymal brain volume loss. Patchy  to confluent supratentorial white matter FLAIR T2 hyperintensities. Small area RIGHT occipital lobe and LEFT frontal encephalomalacia. Old small LEFT cerebellar infarcts. Old small pontine lacunar infarcts. Old bilateral basal ganglia lacunar infarcts. VASCULAR: Normal major intracranial vascular flow voids present at skull base. SKULL AND UPPER CERVICAL SPINE: No abnormal sellar expansion. No suspicious calvarial bone marrow signal. Craniocervical junction maintained. SINUSES/ORBITS: The mastoid air-cells and included paranasal sinuses are well-aerated.The included ocular globes and orbital contents are non-suspicious. OTHER: None. IMPRESSION: 1. Moderately motion degraded examination. Acute RIGHT basal ganglia lacunar infarct. 2. Old small LEFT frontal RIGHT occipital lobe infarcts. Multiple old small supra and infratentorial infarcts. 3. Moderate chronic small vessel ischemic changes. 4. Moderate to severe parenchymal brain volume loss. Electronically Signed   By: Elon Alas M.D.   On: 02/19/2018 15:05   US Carotid Bilateral  Result Date: 02/20/2018 CLINICAL DATA:  Cerebrovascular accident. History of hypertension, hyperlipidemia and diabetes. Former smoker. History of end-stage renal disease, currently on dialysis. EXAM: BILATERAL CAROTID DUPLEX ULTRASOUND TECHNIQUE: Pearline Cables scale imaging, color Doppler and duplex ultrasound were performed of bilateral carotid and vertebral arteries in the neck. COMPARISON:  11/06/2014 FINDINGS: Criteria: Quantification of carotid stenosis is based on velocity parameters that correlate the residual internal carotid diameter with NASCET-based stenosis levels, using the diameter of the distal internal carotid lumen as the denominator for stenosis measurement. The following velocity measurements were obtained: RIGHT ICA:  107/23 cm/sec CCA:  19/41 cm/sec SYSTOLIC ICA/CCA RATIO:  1.3 ECA:  104 cm/sec LEFT ICA:  85/19 cm/sec CCA:  74/08 cm/sec SYSTOLIC ICA/CCA RATIO:  1.0 ECA:   87 cm/sec RIGHT CAROTID ARTERY: There is a minimal amount of echogenic plaque within the right carotid bulb (images 16 and 23), morphologically similar to the 10/2014 examination and again not resulting in elevated peak systolic velocities within the interrogated course of the right internal carotid artery to suggest a hemodynamically significant stenosis. RIGHT VERTEBRAL ARTERY:  Antegrade flow LEFT CAROTID ARTERY: There is a minimal amount of echogenic plaque within the left carotid bulb (image 47 and 49), extending to involve the origin and proximal aspect the left internal carotid artery (image 55), morphologically similar to the 10/2014 examination and again not resulting in elevated peak systolic velocities within the interrogated course the left internal carotid artery to suggest a hemodynamically significant stenosis. LEFT VERTEBRAL ARTERY:  Antegrade Flow IMPRESSION: Minimal amount of bilateral atherosclerotic plaque, left subjectively greater  than right, morphologically similar to the 10/2014 examination, and again not resulting in a hemodynamically significant stenosis within either internal carotid artery. Electronically Signed   By: Sandi Mariscal M.D.   On: 02/20/2018 10:37   [place] Dialysis Access  Result Date: 02/20/2018 CLINICAL DATA:  Left upper extremity dialysis access assess for patency EXAM: ULTRASOUND DIALYSIS ACCESS TECHNIQUE: Ultrasound Doppler interrogation of the left arm access site performed with grayscale, color and Doppler evaluation. COMPARISON:  None. FINDINGS: Inflow artery: 2.5 mm, 62 centimeters/second Arterial anastomosis 5.8 mm, 106 centimeters/seconds Graft: Proximal 6.7 mm, 145 centimeters/second Mid 5.8 mm, 82 centimeters/second Distal 5.8 mm, 106 centimeters/second Left upper arm AV access appears patent. Stented segments throughout the access appear patent. No significant velocity abnormality to suggest significant access stenosis. Negative for occlusion or significant  thrombus. Irregular hypoechoic perigraft complex fluid about the mid forearm level measures 1.8 x 1.1 cm more compatible with perigraft hematoma rather than abscess. This correlates with an area repeated puncture site. No significant velocity abnormality. AV anastomosis demonstrates significant vascular disease of the inflow radial artery. IMPRESSION: Patent left upper extremity dialysis access conduit containing stented segments which are also patent. Negative for occlusion or significant stenosis. 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 Electronically Signed   By: Jerilynn Mages.  Shick M.D.   On: 02/20/2018 15:56     Medications:   . aspirin EC  81 mg Oral Daily  . atorvastatin  80 mg Oral q1800  . calcium acetate  1,334 mg Oral TID WC  . Chlorhexidine Gluconate Cloth  6 each Topical Q0600  . clopidogrel  75 mg Oral Q breakfast  . heparin  5,000 Units Subcutaneous Q8H  . Influenza vac split quadrivalent PF  0.5 mL Intramuscular Tomorrow-1000  . insulin aspart  0-15 Units Subcutaneous TID WC  . insulin aspart  0-5 Units Subcutaneous QHS  . losartan  25 mg Oral Daily  . metoprolol succinate  12.5 mg Oral Q T,Th,Sat-1800  . metoprolol succinate  25 mg Oral Q M,W,F,Su-1800  . multivitamin  1 tablet Oral QHS  . sodium chloride flush  3 mL Intravenous Q12H      Assessment/ Plan:  67 y.o. 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   TTS [facility] [address]/ 70 kg/   1. ESRD - continue HD TTS - orders in place Minimal fluid removal AVG hematoma detected by ultrasound  2. AOCKD - Hgb 13.0, Hold EPO  3. SHPTH - monitor phos  4. Altered mental  status - work up in progress - neurology following  5.  Swelling around AV graft Hematoma  6. Bacteremia - staph coag neg - likely contaminant        LOS: [place] 9/7/201910:51 AM  [place], [place]  Note: This note was prepared with Dragon dictation. Any transcription errors are unintentional

## 2018-02-21 NOTE — Plan of Care (Signed)

## 2018-02-21 NOTE — Progress Notes (Addendum)
[facility] [place] NAME: [name]    MR#:  [id number]  DATE OF BIRTH:  Sep 18, 1950  SUBJECTIVE:  CHIEF COMPLAINT:  No chief complaint on file. Patient is demented or confused.  Communicative.  On hemodialysis.  REVIEW OF SYSTEMS:    Review of Systems  Unable to perform ROS: Mental status change    DRUG ALLERGIES:   Allergies  Allergen Reactions  . Ivp Dye [Iodinated Diagnostic Agents] Other (See Comments)    Pt denied    VITALS:  Blood pressure 138/72, pulse 90, temperature 98.9 F (37.2 C), temperature source Oral, resp. rate 14, height 5\' 5"  (1.651 m), weight 74.3 kg, SpO2 100 %.  PHYSICAL EXAMINATION:  GENERAL:  67 y.o.-year-old patient lying in the bed with no acute distress.  EYES: Pupils equal, round, reactive to light and accommodation. No scleral icterus. Extraocular muscles intact.  HEENT: Head atraumatic, normocephalic. Oropharynx and nasopharynx clear.  NECK:  Supple, no jugular venous distention. No thyroid enlargement, no tenderness.  LUNGS: Normal breath sounds bilaterally, no wheezing, rales,rhonchi or crepitation. No use of accessory muscles of respiration.  CARDIOVASCULAR: S1, S2 normal. No murmurs, rubs, or gallops.  ABDOMEN: Soft, nontender, nondistended. Bowel sounds present. No organomegaly or mass.  EXTREMITIES: No pedal edema, cyanosis, or clubbing.  NEUROLOGIC: Unable to exam. PSYCHIATRIC: The patient is confused and noncommunicative. SKIN: No obvious rash, lesion, or ulcer.   Physical Exam LABORATORY PANEL:   CBC Recent Labs  Lab 02/19/18 0700  WBC 10.6  HGB 13.0  HCT 40.4  PLT 195   ------------------------------------------------------------------------------------------------------------------  Chemistries  Recent Labs  Lab 02/19/18 0700 02/20/18 0454  NA 137 140  K 3.8 4.2  CL 93* 98  CO2 27 28  GLUCOSE 161* 126*  BUN 43* 22  CREATININE 12.89* 8.04*  CALCIUM 10.3 9.1  AST 23   --   ALT 14  --   ALKPHOS 82  --   BILITOT 1.3*  --    ------------------------------------------------------------------------------------------------------------------  Cardiac Enzymes Recent Labs  Lab 02/19/18 2220 02/20/18 0454  TROPONINI 0.07* 0.09*   ------------------------------------------------------------------------------------------------------------------  RADIOLOGY:  Mr Brain Wo Contrast  Result Date: 02/19/2018 CLINICAL DATA:  Altered mental status for 2 weeks. History of end-stage renal disease on dialysis, hypertension, hypercholesterolemia and diabetes. EXAM: MRI HEAD WITHOUT CONTRAST TECHNIQUE: Multiplanar, multiecho pulse sequences of the brain and surrounding structures were obtained without intravenous contrast. COMPARISON:  CT HEAD February 19, 2018 and MRI of the head February 13, 2018 FINDINGS: Moderately motion degraded examination. INTRACRANIAL CONTENTS: 6 mm reduced diffusion RIGHT basal ganglia with low ADC values. Minimal T2 shine through LEFT fourth frontal periventricular white matter. Scattered chronic microhemorrhages better seen on today's SWI sequence. Cerebellar dentate nuclei and basal ganglia mineralization. Moderate to severe parenchymal brain volume loss. Patchy to confluent supratentorial white matter FLAIR T2 hyperintensities. Small area RIGHT occipital lobe and LEFT frontal encephalomalacia. Old small LEFT cerebellar infarcts. Old small pontine lacunar infarcts. Old bilateral basal ganglia lacunar infarcts. VASCULAR: Normal major intracranial vascular flow voids present at skull base. SKULL AND UPPER CERVICAL SPINE: No abnormal sellar expansion. No suspicious calvarial bone marrow signal. Craniocervical junction maintained. SINUSES/ORBITS: The mastoid air-cells and included paranasal sinuses are well-aerated.The included ocular globes and orbital contents are non-suspicious. OTHER: None. IMPRESSION: 1. Moderately motion degraded examination. Acute RIGHT  basal ganglia lacunar infarct. 2. Old small LEFT frontal RIGHT occipital lobe infarcts. Multiple old small supra and infratentorial infarcts. 3. Moderate chronic small vessel ischemic  changes. 4. Moderate to severe parenchymal brain volume loss. Electronically Signed   By: Elon Alas M.D.   On: 02/19/2018 15:05   US Carotid Bilateral  Result Date: 02/20/2018 CLINICAL DATA:  Cerebrovascular accident. History of hypertension, hyperlipidemia and diabetes. Former smoker. History of end-stage renal disease, currently on dialysis. EXAM: BILATERAL CAROTID DUPLEX ULTRASOUND TECHNIQUE: Pearline Cables scale imaging, color Doppler and duplex ultrasound were performed of bilateral carotid and vertebral arteries in the neck. COMPARISON:  11/06/2014 FINDINGS: Criteria: Quantification of carotid stenosis is based on velocity parameters that correlate the residual internal carotid diameter with NASCET-based stenosis levels, using the diameter of the distal internal carotid lumen as the denominator for stenosis measurement. The following velocity measurements were obtained: RIGHT ICA:  107/23 cm/sec CCA:  02/54 cm/sec SYSTOLIC ICA/CCA RATIO:  1.3 ECA:  104 cm/sec LEFT ICA:  85/19 cm/sec CCA:  27/06 cm/sec SYSTOLIC ICA/CCA RATIO:  1.0 ECA:  87 cm/sec RIGHT CAROTID ARTERY: There is a minimal amount of echogenic plaque within the right carotid bulb (images 16 and 23), morphologically similar to the 10/2014 examination and again not resulting in elevated peak systolic velocities within the interrogated course of the right internal carotid artery to suggest a hemodynamically significant stenosis. RIGHT VERTEBRAL ARTERY:  Antegrade flow LEFT CAROTID ARTERY: There is a minimal amount of echogenic plaque within the left carotid bulb (image 47 and 49), extending to involve the origin and proximal aspect the left internal carotid artery (image 55), morphologically similar to the 10/2014 examination and again not resulting in elevated peak  systolic velocities within the interrogated course the left internal carotid artery to suggest a hemodynamically significant stenosis. LEFT VERTEBRAL ARTERY:  Antegrade Flow IMPRESSION: Minimal amount of bilateral atherosclerotic plaque, left subjectively greater than right, morphologically similar to the 10/2014 examination, and again not resulting in a hemodynamically significant stenosis within either internal carotid artery. Electronically Signed   By: Sandi Mariscal M.D.   On: 02/20/2018 10:37   [place] Dialysis Access  Result Date: 02/20/2018 CLINICAL DATA:  Left upper extremity dialysis access assess for patency EXAM: ULTRASOUND DIALYSIS ACCESS TECHNIQUE: Ultrasound Doppler interrogation of the left arm access site performed with grayscale, color and Doppler evaluation. COMPARISON:  None. FINDINGS: Inflow artery: 2.5 mm, 62 centimeters/second Arterial anastomosis 5.8 mm, 106 centimeters/seconds Graft: Proximal 6.7 mm, 145 centimeters/second Mid 5.8 mm, 82 centimeters/second Distal 5.8 mm, 106 centimeters/second Left upper arm AV access appears patent. Stented segments throughout the access appear patent. No significant velocity abnormality to suggest significant access stenosis. Negative for occlusion or significant thrombus. Irregular hypoechoic perigraft complex fluid about the mid forearm level measures 1.8 x 1.1 cm more compatible with perigraft hematoma rather than abscess. This correlates with an area repeated puncture site. No significant velocity abnormality. AV anastomosis demonstrates significant vascular disease of the inflow radial artery. IMPRESSION: Patent left upper extremity dialysis access conduit containing stented segments which are also patent. Negative for occlusion or significant stenosis. No significant velocity abnormality Mid forearm perigraft heterogeneous complex fluid favored to represent surrounding perigraft hematoma rather than abscess Atherosclerotic arterial disease of the inflow  radial artery noted. Electronically Signed   By: Jerilynn Mages.  Shick M.D.   On: 02/20/2018 15:56    ASSESSMENT AND PLAN:  *Acute ischemic right basal ganglia infarct with encephalopathy Delayed presentation-patient was encephalopathic for approximately 2 weeks before [name] for evaluation from outpatient dialysis On stroke protocol, neurology input greatly appreciated, aspirin, statin therapy.  Echocardiogram: LV EF: 30% -   35%,  carotid Dopplers is unremarkable.  Aspiration/fall/skin care precautions while in house, and continue close medical monitoring   *Acute gram-positive cocci blood infection Continue IV vancomycin, follow-up on repeated blood cultures.  Can discontinue vancomycin if repeated blood culture is negative per ID Dr. Steva Ready.  *Acute probable left community-acquired pneumonia Continue empiric Rocephin/azithromycin, pneumonia protocol, follow-up on cultures   *Chronic ESRD on HD TTS Nephrology following for hemodialysis needs  *History of peptic ulcer disease PPI daily  *Acute accelerated hypertension Improved Continue current regiment  *History of seizure disorder Continue Keppra, seizure precautions  Discussed with Dr. Candiss Norse. All the records are reviewed and case discussed with Care Management/Social Workerr. Management plans discussed with the patient, family and they are in agreement.  CODE STATUS: dnr  TOTAL TIME TAKING CARE OF THIS PATIENT: 33 minutes.  POSSIBLE D/C IN 2-3 DAYS, DEPENDING ON CLINICAL CONDITION.   Demetrios Loll M.D on 02/21/2018   Between 7am to 6pm - Pager - [phone]  After 6pm go to [email or web address] - password [email or web address] [facility]  Office  [phone]  CC: Primary care physician; [name], MD  Note: This dictation was prepared with Dragon dictation along with smaller phrase technology. Any transcriptional errors that result from this process are unintentional.

## 2018-02-22 LAB — GLUCOSE, CAPILLARY
GLUCOSE-CAPILLARY: 106 mg/dL — AB (ref 70–99)
GLUCOSE-CAPILLARY: 108 mg/dL — AB (ref 70–99)
GLUCOSE-CAPILLARY: 98 mg/dL (ref 70–99)
Glucose-Capillary: 90 mg/dL (ref 70–99)

## 2018-02-22 LAB — CULTURE, BLOOD (ROUTINE X 2): SPECIAL REQUESTS: ADEQUATE

## 2018-02-22 MED ORDER — HYDRALAZINE HCL 25 MG PO TABS
25.0000 mg | ORAL_TABLET | Freq: Three times a day (TID) | ORAL | Status: DC
  Administered 2018-02-22 – 2018-02-27 (×10): 25 mg via ORAL
  Filled 2018-02-22 (×11): qty 1

## 2018-02-22 NOTE — Progress Notes (Signed)
[name] NAME: [name]    MR#:  [id number]  DATE OF BIRTH:  1950-08-05  SUBJECTIVE:  CHIEF COMPLAINT:  No chief complaint on file. Patient is demented or confused.  He is more awake. REVIEW OF SYSTEMS:    Review of Systems  Unable to perform ROS: Mental status change    DRUG ALLERGIES:   Allergies  Allergen Reactions  . Ivp Dye [Iodinated Diagnostic Agents] Other (See Comments)    Pt denied    VITALS:  Blood pressure (!) 198/93, pulse 95, temperature 98.4 F (36.9 C), temperature source Axillary, resp. rate 19, height 5\' 5"  (1.651 m), weight 72.2 kg, SpO2 99 %.  PHYSICAL EXAMINATION:  GENERAL:  67 y.o.-year-old patient lying in the bed with no acute distress.  EYES: Pupils equal, round, reactive to light and accommodation. No scleral icterus. Extraocular muscles intact.  HEENT: Head atraumatic, normocephalic.  NECK:  Supple, no jugular venous distention. No thyroid enlargement, no tenderness.  LUNGS: Normal breath sounds bilaterally, no wheezing, rales,rhonchi or crepitation. No use of accessory muscles of respiration.  CARDIOVASCULAR: S1, S2 normal. No murmurs, rubs, or gallops.  ABDOMEN: Soft, nontender, nondistended. Bowel sounds present. No organomegaly or mass.  EXTREMITIES: No pedal edema, cyanosis, or clubbing.  NEUROLOGIC: Unable to exam. PSYCHIATRIC: The patient is confused and noncommunicative. SKIN: No obvious rash, lesion, or ulcer.   Physical Exam LABORATORY PANEL:   CBC Recent Labs  Lab 02/19/18 0700  WBC 10.6  HGB 13.0  HCT 40.4  PLT 195   ------------------------------------------------------------------------------------------------------------------  Chemistries  Recent Labs  Lab 02/19/18 0700 02/20/18 0454  NA 137 140  K 3.8 4.2  CL 93* 98  CO2 27 28  GLUCOSE 161* 126*  BUN 43* 22  CREATININE 12.89* 8.04*  CALCIUM 10.3 9.1  AST 23  --   ALT 14  --   ALKPHOS 82  --    BILITOT 1.3*  --    ------------------------------------------------------------------------------------------------------------------  Cardiac Enzymes Recent Labs  Lab 02/19/18 2220 02/20/18 0454  TROPONINI 0.07* 0.09*   ------------------------------------------------------------------------------------------------------------------  RADIOLOGY:  [place] Dialysis Access  Result Date: 02/20/2018 CLINICAL DATA:  Left upper extremity dialysis access assess for patency EXAM: ULTRASOUND DIALYSIS ACCESS TECHNIQUE: Ultrasound Doppler interrogation of the left arm access site performed with grayscale, color and Doppler evaluation. COMPARISON:  None. FINDINGS: Inflow artery: 2.5 mm, 62 centimeters/second Arterial anastomosis 5.8 mm, 106 centimeters/seconds Graft: Proximal 6.7 mm, 145 centimeters/second Mid 5.8 mm, 82 centimeters/second Distal 5.8 mm, 106 centimeters/second Left upper arm AV access appears patent. Stented segments throughout the access appear patent. No significant velocity abnormality to suggest significant access stenosis. Negative for occlusion or significant thrombus. Irregular hypoechoic perigraft complex fluid about the mid forearm level measures 1.8 x 1.1 cm more compatible with perigraft hematoma rather than abscess. This correlates with an area repeated puncture site. No significant velocity abnormality. AV anastomosis demonstrates significant vascular disease of the inflow radial artery. IMPRESSION: Patent left upper extremity dialysis access conduit containing stented segments which are also patent. Negative for occlusion or significant stenosis. 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 Electronically Signed   By: David Mages.  Hobbs M.D.   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  Echocardiogram: LV EF: 30% -   35%, carotid Dopplers is unremarkable.  Aspiration/fall/skin care precautions while in house, and continue close medical monitoring   *Acute gram-positive cocci blood infection Continue IV vancomycin, follow-up on repeated blood cultures, so far negative.  Can discontinue vancomycin if repeated blood culture is negative per ID [name].  *Acute probable left community-acquired pneumonia Continue empiric Rocephin/azithromycin, pneumonia protocol, follow-up on cultures   *Chronic ESRD on HD TTS Nephrology following for hemodialysis needs  *History of peptic ulcer disease PPI daily  *Acute accelerated hypertension Continue Lopressor, and hydralazine p.o. and IV as needed.  *History of seizure disorder Continue Keppra, seizure precautions  Acute metabolic encephalopathy and possible underlying dementia. Per his wife, the patient has confusion at baseline, but worse this time. Aspiration fall precaution.  All the records are reviewed and case discussed with Care Management/Social Workerr. Management plans discussed with the patient, his wife and they are in agreement.  CODE STATUS: dnr  TOTAL TIME TAKING CARE OF THIS PATIENT: 36 minutes.  POSSIBLE D/C IN 2 DAYS, DEPENDING ON CLINICAL CONDITION.   [name] M.D on 02/22/2018   Between 7am to 6pm - Pager - [phone]  After 6pm go to [email or web address] - password [email or web address] [facility]  Office  [phone]  CC: Primary care physician; David Axe, MD  Note: This dictation was prepared with Dragon dictation along with smaller phrase technology. Any transcriptional errors that result from this process are  unintentional.

## 2018-02-22 NOTE — Progress Notes (Signed)
[facility], [place] 02/22/18  Subjective:   Patient remains ill More alert today Able to answer yes/no and follow simple commands HD done on saturday  Objective:  Vital signs in last 24 hours:  Temp:  [97.7 F (36.5 C)-98.9 F (37.2 C)] 97.7 F (36.5 C) (09/08 0345) Pulse Rate:  [43-97] 80 (09/08 0345) Resp:  [5-24] 20 (09/08 0345) BP: (105-183)/(61-149) 183/68 (09/08 0345) SpO2:  [96 %-100 %] 100 % (09/08 0345) Weight:  [72.2 kg-74.3 kg] 72.2 kg (09/08 0702)  Weight change: 5.217 kg Filed Weights   02/21/18 1131 02/21/18 1530 02/22/18 0702  Weight: 74.3 kg 74.1 kg 72.2 kg    Intake/Output:    Intake/Output Summary (Last 24 hours) at 02/22/2018 0943 Last data filed at 02/21/2018 2220 Gross per 24 hour  Intake 405.21 ml  Output 534 ml  Net -128.79 ml     Physical Exam: General: NAD, laying in bed  HEENT Anicteric, moist mucus membranes  Neck supple  Pulm/lungs Coarse b/l  CVS/Heart No rub  Abdomen:  Soft, NT  Extremities: no edema  Neurologic: Able to follow a few basic commands  Skin: No rashes  Access: Left forearm AVG, some swelling around the graft       Basic Metabolic Panel:  Recent Labs  Lab 02/19/18 0700 02/20/18 0454  NA 137 140  K 3.8 4.2  CL 93* 98  CO2 27 28  GLUCOSE 161* 126*  BUN 43* 22  CREATININE 12.89* 8.04*  CALCIUM 10.3 9.1     CBC: Recent Labs  Lab 02/19/18 0700  WBC 10.6  NEUTROABS 4.3  HGB 13.0  HCT 40.4  MCV 75.4*  PLT 195      Lab Results  Component Value Date   HEPBSAG Negative 09/05/2016      Microbiology:  Recent Results (from the past 240 hour(s))  Culture, blood (routine x 2)     Status: None (Preliminary result)   Collection Time: 02/19/18  9:17 AM  Result Value Ref Range Status   Specimen Description BLOOD RIGHT ARM  Final   Special Requests   Final    BOTTLES DRAWN AEROBIC AND ANAEROBIC Blood Culture adequate volume   Culture   Final    NO GROWTH 3  DAYS Performed at [facility], [address] [place], [place] [address]    Report Status PENDING  Incomplete  Culture, blood (routine x 2)     Status: Abnormal   Collection Time: 02/19/18  9:48 AM  Result Value Ref Range Status   Specimen Description   Final    BLOOD RIGHT FINGER Performed at [facility], [address] [place], [place] [address]    Special Requests   Final    BOTTLES DRAWN AEROBIC AND ANAEROBIC Blood Culture adequate volume Performed at [facility], [address] [place], [place] [address]    Culture  Setup Time   Final    IN BOTH AEROBIC AND ANAEROBIC BOTTLES GRAM POSITIVE COCCI IN CLUSTERS GRAM POSITIVE RODS CRITICAL RESULT CALLED TO, READ BACK BY AND VERIFIED WITH: C/MATT MCBANE @0220  02/20/18 [facility]    Culture (A)  Final    STAPHYLOCOCCUS SPECIES (COAGULASE NEGATIVE) THE SIGNIFICANCE OF ISOLATING THIS ORGANISM FROM A SINGLE SET OF BLOOD CULTURES WHEN MULTIPLE SETS ARE DRAWN IS UNCERTAIN. PLEASE NOTIFY THE MICROBIOLOGY DEPARTMENT WITHIN ONE WEEK IF SPECIATION AND SENSITIVITIES ARE REQUIRED. BACILLUS SPECIES Standardized susceptibility testing for this organism is not available. Performed at [facility], [address] [place], [place] [address]    Report Status 02/22/2018 FINAL  Final  Blood Culture ID Panel (Reflexed)     Status: Abnormal   Collection Time: 02/19/18  9:48 AM  Result Value Ref Range Status   Enterococcus species NOT DETECTED NOT DETECTED Final   Listeria monocytogenes NOT DETECTED NOT DETECTED Final   Staphylococcus species DETECTED (A) NOT DETECTED Final    Comment: Methicillin (oxacillin) susceptible coagulase negative staphylococcus. Possible blood culture contaminant (unless isolated from more than one blood culture draw or clinical case suggests pathogenicity). No antibiotic treatment is indicated for blood  culture contaminants. CRITICAL RESULT CALLED TO, READ BACK BY AND VERIFIED WITH: C/MATT  MCBANE @0220  02/20/18 [facility]    Staphylococcus aureus NOT DETECTED NOT DETECTED Final   Methicillin resistance NOT DETECTED NOT DETECTED Final   Streptococcus species NOT DETECTED NOT DETECTED Final   Streptococcus agalactiae NOT DETECTED NOT DETECTED Final   Streptococcus pneumoniae NOT DETECTED NOT DETECTED Final   Streptococcus pyogenes NOT DETECTED NOT DETECTED Final   Acinetobacter baumannii NOT DETECTED NOT DETECTED Final   Enterobacteriaceae species NOT DETECTED NOT DETECTED Final   Enterobacter cloacae complex NOT DETECTED NOT DETECTED Final   Escherichia coli NOT DETECTED NOT DETECTED Final   Klebsiella oxytoca NOT DETECTED NOT DETECTED Final   Klebsiella pneumoniae NOT DETECTED NOT DETECTED Final   Proteus species NOT DETECTED NOT DETECTED Final   Serratia marcescens NOT DETECTED NOT DETECTED Final   Haemophilus influenzae NOT DETECTED NOT DETECTED Final   Neisseria meningitidis NOT DETECTED NOT DETECTED Final   Pseudomonas aeruginosa NOT DETECTED NOT DETECTED Final   Candida albicans NOT DETECTED NOT DETECTED Final   Candida glabrata NOT DETECTED NOT DETECTED Final   Candida krusei NOT DETECTED NOT DETECTED Final   Candida parapsilosis NOT DETECTED NOT DETECTED Final   Candida tropicalis NOT DETECTED NOT DETECTED Final    Comment: Performed at [facility], [address] [place], [place] [address]  MRSA PCR Screening     Status: None   Collection Time: 02/19/18 12:36 PM  Result Value Ref Range Status   MRSA by PCR NEGATIVE NEGATIVE Final    Comment:        The GeneXpert MRSA Assay (FDA approved for NASAL specimens only), is one component of a comprehensive MRSA colonization surveillance program. It is not intended to diagnose MRSA infection nor to guide or monitor treatment for MRSA infections. Performed at [facility], [address] [place], [place] [address]   CULTURE, BLOOD (ROUTINE X 2) w Reflex to ID Panel     Status: None (Preliminary  result)   Collection Time: 02/20/18 11:27 PM  Result Value Ref Range Status   Specimen Description BLOOD RIGHT HAND  Final   Special Requests   Final    BOTTLES DRAWN AEROBIC AND ANAEROBIC Blood Culture results may not be optimal due to an excessive volume of blood received in culture bottles   Culture   Final    NO GROWTH 2 DAYS Performed at [facility], [address], [place],  [address]    Report Status PENDING  Incomplete  CULTURE, BLOOD (ROUTINE X 2) w Reflex to ID Panel     Status: None (Preliminary result)   Collection Time: 02/20/18 11:39 PM  Result Value Ref Range Status   Specimen Description BLOOD RIGHT ANTECUBITAL  Final   Special Requests   Final    BOTTLES DRAWN AEROBIC AND ANAEROBIC Blood Culture results may not be  optimal due to an excessive volume of blood received in culture bottles   Culture   Final    NO GROWTH 2 DAYS Performed at [facility], [address] [place], [place] [address]    Report Status PENDING  Incomplete    Coagulation Studies: No results for input(s): LABPROT, INR in the last 72 hours.  Urinalysis: No results for input(s): COLORURINE, LABSPEC, PHURINE, GLUCOSEU, HGBUR, BILIRUBINUR, KETONESUR, PROTEINUR, UROBILINOGEN, NITRITE, LEUKOCYTESUR in the last 72 hours.  Invalid input(s): APPERANCEUR    Imaging: US Carotid Bilateral  Result Date: 02/20/2018 CLINICAL DATA:  Cerebrovascular accident. History of hypertension, hyperlipidemia and diabetes. Former smoker. History of end-stage renal disease, currently on dialysis. EXAM: BILATERAL CAROTID DUPLEX ULTRASOUND TECHNIQUE: Pearline Cables scale imaging, color Doppler and duplex ultrasound were performed of bilateral carotid and vertebral arteries in the neck. COMPARISON:  11/06/2014 FINDINGS: Criteria: Quantification of carotid stenosis is based on velocity parameters that correlate the residual internal carotid diameter with NASCET-based stenosis levels, using the diameter of the  distal internal carotid lumen as the denominator for stenosis measurement. The following velocity measurements were obtained: RIGHT ICA:  107/23 cm/sec CCA:  09/32 cm/sec SYSTOLIC ICA/CCA RATIO:  1.3 ECA:  104 cm/sec LEFT ICA:  85/19 cm/sec CCA:  35/57 cm/sec SYSTOLIC ICA/CCA RATIO:  1.0 ECA:  87 cm/sec RIGHT CAROTID ARTERY: There is a minimal amount of echogenic plaque within the right carotid bulb (images 16 and 23), morphologically similar to the 10/2014 examination and again not resulting in elevated peak systolic velocities within the interrogated course of the right internal carotid artery to suggest a hemodynamically significant stenosis. RIGHT VERTEBRAL ARTERY:  Antegrade flow LEFT CAROTID ARTERY: There is a minimal amount of echogenic plaque within the left carotid bulb (image 47 and 49), extending to involve the origin and proximal aspect the left internal carotid artery (image 55), morphologically similar to the 10/2014 examination and again not resulting in elevated peak systolic velocities within the interrogated course the left internal carotid artery to suggest a hemodynamically significant stenosis. LEFT VERTEBRAL ARTERY:  Antegrade Flow IMPRESSION: Minimal amount of bilateral atherosclerotic plaque, left subjectively greater than right, morphologically similar to the 10/2014 examination, and again not resulting in a hemodynamically significant stenosis within either internal carotid artery. Electronically Signed   By: Sandi Mariscal M.D.   On: 02/20/2018 10:37   [place] Dialysis Access  Result Date: 02/20/2018 CLINICAL DATA:  Left upper extremity dialysis access assess for patency EXAM: ULTRASOUND DIALYSIS ACCESS TECHNIQUE: Ultrasound Doppler interrogation of the left arm access site performed with grayscale, color and Doppler evaluation. COMPARISON:  None. FINDINGS: Inflow artery: 2.5 mm, 62 centimeters/second Arterial anastomosis 5.8 mm, 106 centimeters/seconds Graft: Proximal 6.7 mm, 145  centimeters/second Mid 5.8 mm, 82 centimeters/second Distal 5.8 mm, 106 centimeters/second Left upper arm AV access appears patent. Stented segments throughout the access appear patent. No significant velocity abnormality to suggest significant access stenosis. Negative for occlusion or significant thrombus. Irregular hypoechoic perigraft complex fluid about the mid forearm level measures 1.8 x 1.1 cm more compatible with perigraft hematoma rather than abscess. This correlates with an area repeated puncture site. No significant velocity abnormality. AV anastomosis demonstrates significant vascular disease of the inflow radial artery. IMPRESSION: Patent left upper extremity dialysis access conduit containing stented segments which are also patent. Negative for occlusion or significant stenosis. 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 Electronically  Signed   By: Jerilynn Mages.  Shick M.D.   On: 02/20/2018 15:56     Medications:   . aspirin EC  81 mg Oral Daily  . atorvastatin  80 mg Oral q1800  . calcium acetate  1,334 mg Oral TID WC  . Chlorhexidine Gluconate Cloth  6 each Topical Q0600  . clopidogrel  75 mg Oral Q breakfast  . heparin  5,000 Units Subcutaneous Q8H  . Influenza vac split quadrivalent PF  0.5 mL Intramuscular Tomorrow-1000  . insulin aspart  0-15 Units Subcutaneous TID WC  . insulin aspart  0-5 Units Subcutaneous QHS  . losartan  25 mg Oral Daily  . metoprolol succinate  12.5 mg Oral Q T,Th,Sat-1800  . metoprolol succinate  25 mg Oral Q M,W,F,Su-1800  . multivitamin  1 tablet Oral QHS  . sodium chloride flush  3 mL Intravenous Q12H      Assessment/ Plan:  67 y.o. Asian ([place])  male with hypertension, coronary artery disease status post CABG, hyperlipidemia, gout, diabetes mellitus type II, End stage  renal disease with history of renal transplant on hemodialysis.   TTS [facility] [address]/ 70 kg/   1. ESRD - continue HD TTS - AVG hematoma detected by ultrasound - Next HD on Tuesday  2. AOCKD - Hgb 13.0, Hold EPO  3. SHPTH - monitor phos  4. Altered mental status - work up in progress - neurology following  5.  Swelling around AV graft Hematoma  6. Bacteremia - staph coag neg - likely contaminant        LOS: 3 Nathanyal Ashmead 9/8/20199:43 AM  [facility] [place], [place]  Note: This note was prepared with Dragon dictation. Any transcription errors are unintentional

## 2018-02-22 NOTE — Progress Notes (Signed)
Pt was seen for evaluation of basic mobility and note his lethargic presentation upon arrival.  However, he was somewhat resisting the movement of trunk and to side of bed, demonstrating pusher syndrome as well.  He is very L side involved and obviously neglecting that side in sitting postures.  Improved his midline awareness with the work side of bed and would recommend transition to a chair next visit if he is more tolerant of being up.    02/22/18 0900  PT Visit Information  Last PT Received On 02/22/18  Assistance Needed +2  History of Present Illness Pt. is a 67 y.o. male who presented to the ER from hemodialysis with altered mental status. Pt. was admitted for encephalopathy, acute probable left community-acquired pneumonia, and end-stage renal disease due for hemodialysis today  Precautions  Precautions Fall  Restrictions  Weight Bearing Restrictions No  Home Living  Family/patient expects to be discharged to: Unsure  Living Arrangements Spouse/significant other;Children  Available Help at Discharge Family  Type of [place] to enter  Entrance Stairs-Number of Steps [place] One level  Bathroom Shower/Tub [place] - 2 wheels;Shower seat;Wheelchair - manual  Additional Comments retrieved from previous note, pt cannot elaborate  Prior Function  Level of Independence Needs assistance  ADL's / [place] family assisted him for bathing and dressing  Comments family not available to ask about mobility  Communication  Communication Other (comment) (not verbal other than a couple words)  Pain Assessment  Pain Assessment No/denies pain  Cognition  Arousal/Alertness Lethargic  Behavior During Therapy Flat affect;Impulsive  Overall Cognitive Status Difficult to assess  General Comments Pt is not very verbal today  Difficult to assess due to Level of arousal  Upper Extremity Assessment  Upper Extremity  Assessment Generalized weakness  Lower Extremity Assessment  Lower Extremity Assessment Generalized weakness  Cervical / Trunk Assessment  Cervical / Trunk Assessment Normal  Bed Mobility  Overal bed mobility Needs Assistance  Bed Mobility Rolling;Supine to Sit;Sit to Supine  Rolling Max assist  Supine to sit Max assist  Sit to supine Total assist  General bed mobility comments Pt is less helpful once he is tired  Transfers  Overall transfer level Needs assistance  Equipment used 1 person hand held assist  Transfers Sit to/from Stand  Sit to Stand Total assist  General transfer comment pt did not exert through LE's  Ambulation/Gait  General Gait Details unable  Balance  Overall balance assessment Needs assistance  Sitting-balance support Feet supported;Bilateral upper extremity supported  Sitting balance-Leahy Scale Zero  General Comments  General comments (skin integrity, edema, etc.) Pt is up to side of bed with extensive assistance and would not demonstrate enough active mobility to even attempt sitting without max assist  PT - End of Session  Activity Tolerance Patient limited by fatigue;Patient limited by lethargy  Patient left in bed;with call bell/phone within reach;with bed alarm set;with nursing/sitter in room  Nurse Communication Mobility status  PT Assessment  PT Recommendation/Assessment Patient needs continued PT services  PT Visit Diagnosis Muscle weakness (generalized) (M62.81);Difficulty in walking, not elsewhere classified (R26.2);Other abnormalities of gait and mobility (R26.89);Hemiplegia and hemiparesis  Hemiplegia - Right/Left Left  Hemiplegia - dominant/non-dominant Non-dominant  Hemiplegia - caused by Cerebral infarction  PT Problem List Decreased strength;Decreased range of motion;Decreased activity tolerance;Decreased balance;Decreased mobility;Decreased coordination;Decreased cognition;Decreased knowledge of use of DME;Decreased safety  awareness;Cardiopulmonary status limiting activity;Obesity;Other (comment) (L calf has dramatic  clonus but flexion mm's are slightly act)  Barriers to Discharge Other (comment) (dependent care)  Barriers to Discharge Comments dependent transfers and bed mob max of 2  PT Plan  PT Frequency (ACUTE ONLY) 7X/week  PT Treatment/Interventions (ACUTE ONLY) DME instruction;Functional mobility training;Therapeutic activities;Therapeutic exercise;Balance training;Neuromuscular re-education;Patient/family education  AM-PAC PT "6 Clicks" Daily Activity Outcome Measure  Difficulty turning over in bed (including adjusting bedclothes, sheets and blankets)? 1  Difficulty moving from lying on back to sitting on the side of the bed?  1  Difficulty sitting down on and standing up from a chair with arms (e.g., wheelchair, bedside commode, etc,.)? 1  Help needed moving to and from a bed to chair (including a wheelchair)? 1  Help needed walking in hospital room? 1  Help needed climbing 3-5 steps with a railing?  1  6 Click Score 6  Mobility G Code  CN  PT Recommendation  Follow Up Recommendations CIR  PT equipment None recommended by PT  Individuals Consulted  Consulted and Agree with Results and Recommendations Patient unable/family or caregiver not available  Acute Rehab PT Goals  Patient Stated Goal none stated  PT Goal Formulation Patient unable to participate in goal setting  Time For Goal Achievement 03/08/18  Potential to Achieve Goals Fair  PT Time Calculation  PT Start Time (ACUTE ONLY) 0901  PT Stop Time (ACUTE ONLY) 0926  PT Time Calculation (min) (ACUTE ONLY) 25 min  PT General Charges  $$ ACUTE PT VISIT 1 Visit  PT Evaluation  $PT Eval Moderate Complexity 1 Mod  PT Treatments  $Therapeutic Activity 8-22 mins    Mee Hives, PT MS Acute Rehab Dept. Number: [place] and [place]

## 2018-02-23 DIAGNOSIS — I639 Cerebral infarction, unspecified: Secondary | ICD-10-CM

## 2018-02-23 DIAGNOSIS — N186 End stage renal disease: Secondary | ICD-10-CM

## 2018-02-23 DIAGNOSIS — Z992 Dependence on renal dialysis: Secondary | ICD-10-CM

## 2018-02-23 DIAGNOSIS — G9340 Encephalopathy, unspecified: Secondary | ICD-10-CM

## 2018-02-23 DIAGNOSIS — I6359 Cerebral infarction due to unspecified occlusion or stenosis of other cerebral artery: Secondary | ICD-10-CM

## 2018-02-23 DIAGNOSIS — Z515 Encounter for palliative care: Secondary | ICD-10-CM

## 2018-02-23 LAB — GLUCOSE, CAPILLARY
GLUCOSE-CAPILLARY: 69 mg/dL — AB (ref 70–99)
Glucose-Capillary: 92 mg/dL (ref 70–99)
Glucose-Capillary: 138 mg/dL — ABNORMAL HIGH (ref 70–99)
Glucose-Capillary: 164 mg/dL — ABNORMAL HIGH (ref 70–99)
Glucose-Capillary: 70 mg/dL (ref 70–99)

## 2018-02-23 MED ORDER — DEXTROSE 50 % IV SOLN
INTRAVENOUS | Status: AC
  Administered 2018-02-23: 17:00:00
  Filled 2018-02-23: qty 50

## 2018-02-23 MED ORDER — SODIUM CHLORIDE 0.9 % IV SOLN
1.0000 g | INTRAVENOUS | Status: AC
  Administered 2018-02-24 – 2018-02-25 (×2): 1 g via INTRAVENOUS
  Filled 2018-02-23: qty 10
  Filled 2018-02-23: qty 1

## 2018-02-23 MED ORDER — FAMOTIDINE 20 MG PO TABS
20.0000 mg | ORAL_TABLET | ORAL | Status: DC
  Administered 2018-02-24 – 2018-02-26 (×2): 20 mg via ORAL
  Filled 2018-02-23 (×4): qty 1

## 2018-02-23 MED ORDER — LEVETIRACETAM 250 MG PO TABS
250.0000 mg | ORAL_TABLET | ORAL | Status: DC
  Administered 2018-02-25 – 2018-02-26 (×2): 250 mg via ORAL
  Filled 2018-02-23 (×2): qty 1

## 2018-02-23 MED ORDER — AZITHROMYCIN 250 MG PO TABS
500.0000 mg | ORAL_TABLET | Freq: Every day | ORAL | Status: DC
  Administered 2018-02-24 – 2018-02-25 (×2): 500 mg via ORAL
  Filled 2018-02-23 (×2): qty 2

## 2018-02-23 MED ORDER — LEVETIRACETAM 750 MG PO TABS
750.0000 mg | ORAL_TABLET | Freq: Every day | ORAL | Status: DC
  Administered 2018-02-24 – 2018-02-25 (×2): 750 mg via ORAL
  Filled 2018-02-23 (×4): qty 1

## 2018-02-23 NOTE — Progress Notes (Signed)
OT Cancellation Note  Patient Details Name: [name] MRN: [id number] DOB: 07/08/1950   Cancelled Treatment:    Reason Eval/Treat Not Completed: Fatigue/lethargy limiting ability to participate. Pt lethargic, unable to meaningfully participate in therapy this date. Will re-attempt next date as pt is able to participate and is medically appropriate.   Jeni Salles, MPH, MS, OTR/L ascom [phone] 02/23/18, 3:29 PM

## 2018-02-23 NOTE — Progress Notes (Addendum)
[facility] [place] NAME: [name]    MR#:  [id number]  DATE OF BIRTH:  1950/07/02  SUBJECTIVE:  CHIEF COMPLAINT:  No chief complaint on file. Patient is demented or confused.  Less responsive just now.  Blood sugar is 63 per RN.  He is being given D50 now. REVIEW OF SYSTEMS:    Review of Systems  Unable to perform ROS: Mental status change    DRUG ALLERGIES:   Allergies  Allergen Reactions  . Ivp Dye [Iodinated Diagnostic Agents] Other (See Comments)    Pt denied    VITALS:  Blood pressure (!) 155/77, pulse 87, temperature 97.7 F (36.5 C), temperature source Oral, resp. rate 20, height 5\' 5"  (1.651 m), weight 72.6 kg, SpO2 99 %.  PHYSICAL EXAMINATION:  GENERAL:  67 y.o.-year-old patient lying in the bed with no acute distress.  EYES: Pupils equal, round, reactive to light and accommodation. No scleral icterus. Extraocular muscles intact.  HEENT: Head atraumatic, normocephalic.  NECK:  Supple, no jugular venous distention. No thyroid enlargement, no tenderness.  LUNGS: Normal breath sounds bilaterally, no wheezing, rales,rhonchi or crepitation. No use of accessory muscles of respiration.  CARDIOVASCULAR: S1, S2 normal. No murmurs, rubs, or gallops.  ABDOMEN: Soft, nontender, nondistended. Bowel sounds present. No organomegaly or mass.  EXTREMITIES: No pedal edema, cyanosis, or clubbing.  NEUROLOGIC: Unable to exam. PSYCHIATRIC: The patient is confused and noncommunicative. SKIN: No obvious rash, lesion, or ulcer.   Physical Exam LABORATORY PANEL:   CBC Recent Labs  Lab 02/19/18 0700  WBC 10.6  HGB 13.0  HCT 40.4  PLT 195   ------------------------------------------------------------------------------------------------------------------  Chemistries  Recent Labs  Lab 02/19/18 0700 02/20/18 0454  NA 137 140  K 3.8 4.2  CL 93* 98  CO2 27 28  GLUCOSE 161* 126*  BUN 43* 22  CREATININE 12.89* 8.04*  CALCIUM  10.3 9.1  AST 23  --   ALT 14  --   ALKPHOS 82  --   BILITOT 1.3*  --    ------------------------------------------------------------------------------------------------------------------  Cardiac Enzymes Recent Labs  Lab 02/19/18 2220 02/20/18 0454  TROPONINI 0.07* 0.09*   ------------------------------------------------------------------------------------------------------------------  RADIOLOGY:  No results found.  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  Echocardiogram: LV EF: 30% -   35%, carotid Dopplers is unremarkable.  Aspiration/fall/skin care precautions while in house, and continue close medical monitoring. Continue aspirin 81 mg/day and Plavix 75 mg per Dr. Doy Mince. Possible inpatient rehab placement.  *Acute gram-positive cocci blood infection He was on IV vancomycin, repeated blood cultures are negative so far.  Discontinued vancomycin if repeated blood culture is negative per ID Dr. Steva Ready.  *Acute probable left community-acquired pneumonia Continue empiric Rocephin/azithromycin, pneumonia protocol.  *Chronic ESRD on HD TTS Nephrology following for hemodialysis needs  *History of peptic ulcer disease PPI daily  *Acute accelerated hypertension Continue Lopressor, and hydralazine p.o. and IV as needed. Controlled.  *History of seizure disorder Continue Keppra, seizure precautions  Hypoglycemia.  D50 IV and monitor blood glucose.  Diabetes 2.  On sliding scale.  Acute metabolic encephalopathy and possible underlying dementia. Per his wife, the patient has confusion at baseline, but worse this time. Aspiration fall precaution.  Very poor prognosis.  Palliative care staff is  following.  Family is  not ready for hospice. All the records are reviewed and case discussed with Care Management/Social Workerr. Management plans discussed with the patient, his wife and daughter and they are in agreement.  CODE STATUS: dnr  TOTAL TIME TAKING CARE OF THIS PATIENT: 36 minutes.  POSSIBLE D/C IN 2 DAYS, DEPENDING ON CLINICAL CONDITION.   [name] M.D on 02/23/2018   Between 7am to 6pm - Pager - [phone]  After 6pm go to [email or web address] - password [email or web address] [facility]  Office  [phone]  CC: Primary care physician; David Axe, MD  Note: This dictation was prepared with Dragon dictation along with smaller phrase technology. Any transcriptional errors that result from this process are unintentional.

## 2018-02-23 NOTE — Progress Notes (Signed)
Inpatient Rehabilitation  Here to meet with patient and if available family regarding recommendations for IP Rehab.  However, patient currently with change in status and with medical team at bedside to assess.  Colletta Maryland, case manager reported earlier to follow up with spouse or daughter [name].  Plan to follow up with them based on medical stability.  I will be off tomorrow, but my phone will be forwarded, and my co-workers will be covering.  Call if questions.   Carmelia Roller., CCC/SLP Admission Coordinator  [place]  Cell [phone]

## 2018-02-23 NOTE — Progress Notes (Signed)
   02/23/18 1700  Clinical Encounter Type  Visited With Patient not available  Visit Type Code    RRT page received after patient became unconscious/non-responsive. Chaplain maintained pastoral presence outside of the patient's room and offered silent prayers while awaiting medical team's assessment. Patient eventually awakened and became alert. Nurse will page if necessary.

## 2018-02-23 NOTE — Progress Notes (Signed)
Pt has refused his 12 noon meds (phoslo) and his Hydralazine eventhough his BP is 155/77. I alerted him how important it is to take his BP meds but he still refused.

## 2018-02-23 NOTE — Care Management (Signed)
Message left for Gunnar Fusi, Admission Coordinator, [place]

## 2018-02-23 NOTE — Progress Notes (Signed)
[facility], [place] 02/23/18  Subjective:  Patient arousable this a.m. and does converse a bit. Overall still appears quite weak.   Objective:  Vital signs in last 24 hours:  Temp:  [97.7 F (36.5 C)-98.4 F (36.9 C)] 97.7 F (36.5 C) (09/09 0453) Pulse Rate:  [53-95] 90 (09/09 0757) Resp:  [18-20] 18 (09/09 0757) BP: (149-198)/(80-93) 150/90 (09/09 0757) SpO2:  [98 %-100 %] 99 % (09/09 0757) Weight:  [72.6 kg] 72.6 kg (09/09 0427)  Weight change: -2.1 kg Filed Weights   02/21/18 1530 02/22/18 0702 02/23/18 0427  Weight: 74.1 kg 72.2 kg 72.6 kg    Intake/Output:    Intake/Output Summary (Last 24 hours) at 02/23/2018 1121 Last data filed at 02/23/2018 1002 Gross per 24 hour  Intake 1260.37 ml  Output -  Net 1260.37 ml     Physical Exam: General: NAD, laying in bed  HEENT Anicteric, moist mucus membranes  Neck supple  Pulm/lungs Coarse b/l, BS present  CVS/Heart S1S2 no rubs  Abdomen:  Soft, NT  Extremities: no edema  Neurologic: Able to follow a few basic commands  Skin: No rashes  Access: Left forearm AVG, some swelling around the graft       Basic Metabolic Panel:  Recent Labs  Lab 02/19/18 0700 02/20/18 0454  NA 137 140  K 3.8 4.2  CL 93* 98  CO2 27 28  GLUCOSE 161* 126*  BUN 43* 22  CREATININE 12.89* 8.04*  CALCIUM 10.3 9.1     CBC: Recent Labs  Lab 02/19/18 0700  WBC 10.6  NEUTROABS 4.3  HGB 13.0  HCT 40.4  MCV 75.4*  PLT 195      Lab Results  Component Value Date   HEPBSAG Negative 09/05/2016      Microbiology:  Recent Results (from the past 240 hour(s))  Culture, blood (routine x 2)     Status: None (Preliminary result)   Collection Time: 02/19/18  9:17 AM  Result Value Ref Range Status   Specimen Description BLOOD RIGHT ARM  Final   Special Requests   Final    BOTTLES DRAWN AEROBIC AND ANAEROBIC Blood Culture adequate volume   Culture   Final    NO GROWTH 4 DAYS Performed at [facility], [address] [place], [place] [address]    Report Status PENDING  Incomplete  Culture, blood (routine x 2)     Status: Abnormal   Collection Time: 02/19/18  9:48 AM  Result Value Ref Range Status   Specimen Description   Final    BLOOD RIGHT FINGER Performed at [facility], [address] [place], [place] [address]    Special Requests   Final    BOTTLES DRAWN AEROBIC AND ANAEROBIC Blood Culture adequate volume Performed at [facility] [address] [place], [place] [address]    Culture  Setup Time   Final    IN BOTH AEROBIC AND ANAEROBIC BOTTLES GRAM POSITIVE COCCI IN CLUSTERS GRAM POSITIVE RODS CRITICAL RESULT CALLED TO, READ BACK BY AND VERIFIED WITH: C/MATT MCBANE @0220  02/20/18 [facility]    Culture (A)  Final    STAPHYLOCOCCUS SPECIES (COAGULASE NEGATIVE) THE SIGNIFICANCE OF ISOLATING THIS ORGANISM FROM A SINGLE SET OF BLOOD CULTURES WHEN MULTIPLE SETS ARE DRAWN IS UNCERTAIN. PLEASE NOTIFY THE MICROBIOLOGY DEPARTMENT WITHIN ONE WEEK IF SPECIATION AND SENSITIVITIES ARE REQUIRED. BACILLUS SPECIES Standardized susceptibility testing for this organism is not available. Performed at [facility], [place] [address],  [place], [place] [address]    Report Status 02/22/2018 FINAL  Final  Blood Culture ID Panel (Reflexed)     Status: Abnormal   Collection Time: 02/19/18  9:48 AM  Result Value Ref Range Status   Enterococcus species NOT DETECTED NOT DETECTED Final   Listeria monocytogenes NOT DETECTED NOT DETECTED Final   Staphylococcus species DETECTED (A) NOT DETECTED Final    Comment: Methicillin (oxacillin) susceptible coagulase negative staphylococcus. Possible blood culture contaminant (unless isolated from more than one blood culture draw or clinical case suggests pathogenicity). No antibiotic treatment is indicated for blood  culture contaminants. CRITICAL RESULT CALLED TO, READ BACK BY AND VERIFIED WITH: C/MATT MCBANE @0220  02/20/18 [facility]     Staphylococcus aureus NOT DETECTED NOT DETECTED Final   Methicillin resistance NOT DETECTED NOT DETECTED Final   Streptococcus species NOT DETECTED NOT DETECTED Final   Streptococcus agalactiae NOT DETECTED NOT DETECTED Final   Streptococcus pneumoniae NOT DETECTED NOT DETECTED Final   Streptococcus pyogenes NOT DETECTED NOT DETECTED Final   Acinetobacter baumannii NOT DETECTED NOT DETECTED Final   Enterobacteriaceae species NOT DETECTED NOT DETECTED Final   Enterobacter cloacae complex NOT DETECTED NOT DETECTED Final   Escherichia coli NOT DETECTED NOT DETECTED Final   Klebsiella oxytoca NOT DETECTED NOT DETECTED Final   Klebsiella pneumoniae NOT DETECTED NOT DETECTED Final   Proteus species NOT DETECTED NOT DETECTED Final   Serratia marcescens NOT DETECTED NOT DETECTED Final   Haemophilus influenzae NOT DETECTED NOT DETECTED Final   Neisseria meningitidis NOT DETECTED NOT DETECTED Final   Pseudomonas aeruginosa NOT DETECTED NOT DETECTED Final   Candida albicans NOT DETECTED NOT DETECTED Final   Candida glabrata NOT DETECTED NOT DETECTED Final   Candida krusei NOT DETECTED NOT DETECTED Final   Candida parapsilosis NOT DETECTED NOT DETECTED Final   Candida tropicalis NOT DETECTED NOT DETECTED Final    Comment: Performed at [facility], [address] [place], [place] [address]  MRSA PCR Screening     Status: None   Collection Time: 02/19/18 12:36 PM  Result Value Ref Range Status   MRSA by PCR NEGATIVE NEGATIVE Final    Comment:        The GeneXpert MRSA Assay (FDA approved for NASAL specimens only), is one component of a comprehensive MRSA colonization surveillance program. It is not intended to diagnose MRSA infection nor to guide or monitor treatment for MRSA infections. Performed at [facility], [address] [place], [place] [address]   CULTURE, BLOOD (ROUTINE X 2) w Reflex to ID Panel     Status: None (Preliminary result)   Collection Time:  02/20/18 11:27 PM  Result Value Ref Range Status   Specimen Description BLOOD RIGHT HAND  Final   Special Requests   Final    BOTTLES DRAWN AEROBIC AND ANAEROBIC Blood Culture results may not be optimal due to an excessive volume of blood received in culture bottles   Culture   Final    NO GROWTH 3 DAYS Performed at [facility], [address] [place], [place] [address]    Report Status PENDING  Incomplete  CULTURE, BLOOD (ROUTINE X 2) w Reflex to ID Panel     Status: None (Preliminary result)   Collection Time: 02/20/18 11:39 PM  Result Value Ref Range Status   Specimen Description BLOOD RIGHT ANTECUBITAL  Final   Special Requests   Final    BOTTLES DRAWN AEROBIC AND ANAEROBIC Blood Culture results may not be optimal due to  an excessive volume of blood received in culture bottles   Culture   Final    NO GROWTH 3 DAYS Performed at [facility], [address] [place], [place] [address]    Report Status PENDING  Incomplete    Coagulation Studies: No results for input(s): LABPROT, INR in the last 72 hours.  Urinalysis: No results for input(s): COLORURINE, LABSPEC, PHURINE, GLUCOSEU, HGBUR, BILIRUBINUR, KETONESUR, PROTEINUR, UROBILINOGEN, NITRITE, LEUKOCYTESUR in the last 72 hours.  Invalid input(s): APPERANCEUR    Imaging: No results found.   Medications:   . aspirin EC  81 mg Oral Daily  . atorvastatin  80 mg Oral q1800  . calcium acetate  1,334 mg Oral TID WC  . Chlorhexidine Gluconate Cloth  6 each Topical Q0600  . clopidogrel  75 mg Oral Q breakfast  . heparin  5,000 Units Subcutaneous Q8H  . hydrALAZINE  25 mg Oral Q8H  . Influenza vac split quadrivalent PF  0.5 mL Intramuscular Tomorrow-1000  . insulin aspart  0-15 Units Subcutaneous TID WC  . insulin aspart  0-5 Units Subcutaneous QHS  . losartan  25 mg Oral Daily  . metoprolol succinate  12.5 mg Oral Q T,Th,Sat-1800  . metoprolol succinate  25 mg Oral Q M,W,F,Su-1800  . multivitamin  1  tablet Oral QHS  . sodium chloride flush  3 mL Intravenous Q12H      Assessment/ Plan:  67 y.o. 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   TTS [facility] [address]/ 70 kg/   1. ESRD -Patient due for hemodialysis again tomorrow.  We will prepare orders.  2. AOCKD -Need to hold Epogen at this time.  3. SHPTH -We plan to repeat serum phosphorus tomorrow.  4. Altered mental status - work up in progress - neurology following, more conversant today.  5.  Swelling around AV graft Hematoma  6. Bacteremia - staph coag neg - likely contaminant        LOS: 4 [name] 9/9/201911:21 AM  [place], [place]  Note: This note was prepared with Dragon dictation. Any transcription errors are unintentional

## 2018-02-23 NOTE — Consult Note (Signed)
Consultation Note Date: 02/23/2018   Patient Name: [name]  DOB: 1951-06-07  MRN: [id number]  Age / Sex: 67 y.o., male  PCP: [name], MD Referring Physician: Demetrios Loll, MD  Reason for Consultation: Establishing goals of care and Psychosocial/spiritual support   Patient and his wife speak [place].  HPI/Patient Profile: 67 y.o. [place]ian (speaks very little [place]) male  with past medical history of CAD s/p CABG, ESRD on HD, DM II, gout who was admitted on 02/19/2018 with altered mental status.  Per his wife, [name], he had been altered for two weeks with decreased PO intake, decreased speech and unable to walk.  She had been managing to get him to HD.   Clinical Assessment and Goals of Care:  I have reviewed medical records including EPIC notes, labs and imaging, received report from team, assessed the patient and then met at the bedside along with his wife [name]) and two daughters, [name] and [name].  [name] speaks English well.  We attempted to discuss diagnosis prognosis, GOC, EOL wishes, disposition and options.  I introduced Palliative Medicine as specialized medical care for people living with serious illness. It focuses on providing relief from the symptoms and stress of a serious illness. The goal is to improve quality of life for both the patient and the family.  We discussed a brief life review of the patient.  He lives at home with his family.  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  He had 3 children and 3 grandchildren.  He was on HD in 1996.  In 2004 he had a kidney transplant that served him well until 2015 when he needed hemodialysis again.    As far as functional and nutritional status over the last 6 months he has had a steady decline.  He is often extremely fatigued after hemodialysis.  He was able to walk from room to room with a can at home and he was eating fairly well  until he became altered two weeks prior to admission.     We discussed his current illness and what it means in the larger context of his on-going co-morbidities.  Natural disease trajectory and expectations at EOL were discussed. Specifically we discussed his current immobility, dysphagia and the likelihood that he would recover.  The family tells me that he has been through a period of illness that was worse than this after he restarted HD (approx 4 years ago) he had stopped eating and walking.  After a long period he recovered.  He was able to walk and eat again.  The family did not know what caused the previous illness, but they are optimistic that if he recovered then - he may recover now.  I attempted to elicit values and goals of care important to the patient.  The patient has been very clear in his wishes.  He is a DNR/DNI.  He does not want surgeries or other invasive procedures.  I does want to continue hemo dialysis and if he had the  opportunity for another kidney transplant he would want it.  Per [name] wants to spend his time at home with his grand children (particularly his 68 yo grand son).  His grand children are his great joy.  Given his current dysphagia and potential for aspiration we discussed feeding tubes - per the family - [name] would never want a feeding tube.   Questions and concerns were addressed.  Hard Choices booklet left for review. The family was encouraged to call with questions or concerns.    Primary Decision Maker:  NEXT OF KIN Wife [name] and daughters [name] and [name]    SUMMARY OF RECOMMENDATIONS     Confirmed DNR/DNI  Patient would never want a PEG or even an N/G.  No artificial feeding.  No surgery or invasive procedures.  Continue hemodialysis.  Family not ready for hospice.  They are hopeful he will improve as he has in the past.  "Treat the treatable".  Code Status/Advance Care Planning: DNR/DNI  Palliative Prophylaxis:    Aspiration  Prognosis:  Likely less than 6 months given CVA now bedbound, D1 diet with dysphagia, ESRD, and mixed heart failure with EF of 30-35%   Discharge Planning: To Be Determined  Recommend Palliative to follow on discharge.      Primary Diagnoses: Present on Admission: . Acute encephalopathy   I have reviewed the medical record, interviewed the patient and family, and examined the patient. The following aspects are pertinent.  Past Medical History:  Diagnosis Date  . Chronic diastolic congestive heart failure ([place])   . Chronic disease anemia   . ESRD (end stage renal disease) on dialysis ([place])    "[facility] [place]; [name]" (09/29/2014)  . GERD (gastroesophageal reflux disease)   . Gout   . High cholesterol   . History of blood transfusion    "related to anemia"  . History of stomach ulcers   . Hypertension   . Type II diabetes mellitus ([place])    Social History   Socioeconomic History  . Marital status: Married    Spouse name: Not on file  . Number of children: Not on file  . Years of education: Not on file  . Highest education level: Not on file  Occupational History  . Not on file  Social Needs  . Financial resource strain: Not on file  . Food insecurity:    Worry: Not on file    Inability: Not on file  . Transportation needs:    Medical: Not on file    Non-medical: Not on file  Tobacco Use  . Smoking status: Former Smoker    Types: Cigarettes  . Smokeless tobacco: Never Used  . Tobacco comment: "quit smoking cigarettes in the 1980's"  Substance and Sexual Activity  . Alcohol use: No  . Drug use: No  . Sexual activity: Not on file  Lifestyle  . Physical activity:    Days per week: Not on file    Minutes per session: Not on file  . Stress: Not on file  Relationships  . Social connections:    Talks on phone: Not on file    Gets together: Not on file    Attends religious service: Not on file    Active member of club or organization: Not on  file    Attends meetings of clubs or organizations: Not on file    Relationship status: Not on file  Other Topics Concern  . Not on file  Social History Narrative  . Not on  file   Family History  Problem Relation Age of Onset  . Hypertension Mother   . Stroke Mother   . Hypertension Father   . Diabetes Mellitus II Father   . Asthma Father    Scheduled Meds: . aspirin EC  81 mg Oral Daily  . atorvastatin  80 mg Oral q1800  . calcium acetate  1,334 mg Oral TID WC  . Chlorhexidine Gluconate Cloth  6 each Topical Q0600  . clopidogrel  75 mg Oral Q breakfast  . heparin  5,000 Units Subcutaneous Q8H  . hydrALAZINE  25 mg Oral Q8H  . Influenza vac split quadrivalent PF  0.5 mL Intramuscular Tomorrow-1000  . insulin aspart  0-15 Units Subcutaneous TID WC  . insulin aspart  0-5 Units Subcutaneous QHS  . losartan  25 mg Oral Daily  . metoprolol succinate  12.5 mg Oral Q T,Th,Sat-1800  . metoprolol succinate  25 mg Oral Q M,W,F,Su-1800  . multivitamin  1 tablet Oral QHS  . sodium chloride flush  3 mL Intravenous Q12H   Continuous Infusions: . sodium chloride Stopped (02/23/18 1142)  . azithromycin 250 mL/hr at 02/23/18 1144  . cefTRIAXone (ROCEPHIN)  IV Stopped (02/22/18 1259)  . famotidine (PEPCID) IV 20 mg (02/22/18 0905)  . levETIRAcetam Stopped (02/21/18 2130)  . levETIRAcetam Stopped (02/23/18 0914)   PRN Meds:.sodium chloride, acetaminophen **OR** acetaminophen, hydrALAZINE, nitroGLYCERIN, ondansetron **OR** ondansetron (ZOFRAN) IV, polyethylene glycol Allergies  Allergen Reactions  . Ivp Dye [Iodinated Diagnostic Agents] Other (See Comments)    Pt denied   Review of Systems patient denies pain.  Unable to give the rest of ROS.  Physical Exam  Chronically ill appearing Asian male, lying in bed.  Able to give me 2 or 3 one word answers to simple questions. CV distant sounds Resp no distress Abdomen soft, nt, nd  Vital Signs: BP (!) 150/90 (BP Location: Right Arm)    Pulse 95   Temp 97.7 F (36.5 C) (Oral)   Resp 20   Ht '5\' 5"'$  (1.651 m)   Wt 72.6 kg   SpO2 99%   BMI 26.63 kg/m  Pain Scale: 0-10   Pain Score: 0-No pain   SpO2: SpO2: 99 % O2 Device:SpO2: 99 % O2 Flow Rate: .O2 Flow Rate (L/min): 2 L/min  IO: Intake/output summary:   Intake/Output Summary (Last 24 hours) at 02/23/2018 1259 Last data filed at 02/23/2018 1144 Gross per 24 hour  Intake 1373.15 ml  Output -  Net 1373.15 ml    LBM: Last BM Date: 02/23/18 Baseline Weight: Weight: 69.6 kg Most recent weight: Weight: 72.6 kg     Palliative Assessment/Data: 20%     Time In: 12:00 Time Out: 1:10 Time Total: 70 min. Greater than 50%  of this time was spent counseling and coordinating care related to the above assessment and plan.  Signed by: Florentina Jenny, PA-C Palliative Medicine Pager: [phone]  Please contact Palliative Medicine Team phone at [phone] for questions and concerns.  For individual provider: See Shea Evans

## 2018-02-23 NOTE — Progress Notes (Signed)
Subjective: Patient is awake and conversing with family. Family at bedside and they report that he has been more awake since he had his hemodialysis on Saturday. He is due for next HD tomorrow.  Objective: Current vital signs: BP (!) 150/90 (BP Location: Right Arm)   Pulse 95   Temp 97.7 F (36.5 C) (Oral)   Resp 20   Ht 5\' 5"  (1.651 m)   Wt 72.6 kg   SpO2 99%   BMI 26.63 kg/m  Vital signs in last 24 hours: Temp:  [97.7 F (36.5 C)-98 F (36.7 C)] 97.7 F (36.5 C) (09/09 0453) Pulse Rate:  [53-95] 95 (09/09 1142) Resp:  [18-20] 20 (09/09 1142) BP: (149-183)/(80-92) 150/90 (09/09 1142) SpO2:  [98 %-100 %] 99 % (09/09 1142) Weight:  [72.6 kg] 72.6 kg (09/09 0427)  Intake/Output from previous day: 09/08 0701 - 09/09 0700 In: 1260.4 [I.V.:3.6; IV Piggyback:1256.8] Out: -  Intake/Output this shift: Total I/O In: 359.4 [I.V.:3.6; IV Piggyback:355.7] Out: -  Nutritional status:  Diet Order            DIET - DYS 1 Room service appropriate? Yes with Assist; Fluid consistency: Nectar Thick  Diet effective now              Physical Exam   Vitals Blood pressure (!) 150/90, pulse 95, temperature 97.7 F (36.5 C), temperature source Oral, resp. rate 20, height 5\' 5"  (1.651 m), weight 72.6 kg, SpO2 99 %.   Neurological Exam  Physical Exam   Vitals Blood pressure (!) 150/90, pulse 95, temperature 97.7 F (36.5 C), temperature source Oral, resp. rate 20, height 5\' 5"  (1.651 m), weight 72.6 kg, SpO2 99 %.   Neurological Exam . Alert, but confused . Nods to simple prompts . Attention span and concentration seemed appropriate  . Language seemed intact (naming, spontaneous speech, comprehension)  . I. Olfactory not examined . II: Visual fields were full. Pupils were equal, round and reactive to light and accommodation . III,IV, VI: ptosis not present, extra-ocular motions intact bilaterally . V,VII: Unable to assess facial symmetry or sensation . VIII: Unable to assess  hearing but will responds to family's voice . IX, X:  gag reflex differed . XI: Unable to assess . XII: unable to assess midline tongue extension  Tone is spastic on the right, and flaccid in all extremities, no abnormal movements seen   Muscle strength in all extremities seemed normal.   Deep tendon reflexes were symmetric   Sensations were intact to light pinprick in all extremities, withdraws  Gait and station deffered  Lab Results: Basic Metabolic Panel: Recent Labs  Lab 02/19/18 0700 02/20/18 0454  NA 137 140  K 3.8 4.2  CL 93* 98  CO2 27 28  GLUCOSE 161* 126*  BUN 43* 22  CREATININE 12.89* 8.04*  CALCIUM 10.3 9.1    Liver Function Tests: Recent Labs  Lab 02/19/18 0700  AST 23  ALT 14  ALKPHOS 82  BILITOT 1.3*  PROT 8.5*  ALBUMIN 4.3   Recent Labs  Lab 02/19/18 0700  LIPASE 62*   Recent Labs  Lab 02/19/18 0700 02/19/18 1202  AMMONIA 20 19    CBC: Recent Labs  Lab 02/19/18 0700  WBC 10.6  NEUTROABS 4.3  HGB 13.0  HCT 40.4  MCV 75.4*  PLT 195    Cardiac Enzymes: Recent Labs  Lab 02/19/18 0700 02/19/18 1544 02/19/18 2220 02/20/18 0454  TROPONINI 0.08* 0.07* 0.07* 0.09*    Lipid Panel: Recent Labs  Lab 02/20/18 0454  CHOL 69  TRIG 88  HDL 31*  CHOLHDL 2.2  VLDL 18  LDLCALC 20    CBG: Recent Labs  Lab 02/22/18 1147 02/22/18 1625 02/22/18 2133 02/23/18 0803 02/23/18 1137  GLUCAP 108* 98 90 92 138*    Microbiology: Results for orders placed or performed during the hospital encounter of 02/19/18  Culture, blood (routine x 2)     Status: None (Preliminary result)   Collection Time: 02/19/18  9:17 AM  Result Value Ref Range Status   Specimen Description BLOOD RIGHT ARM  Final   Special Requests   Final    BOTTLES DRAWN AEROBIC AND ANAEROBIC Blood Culture adequate volume   Culture   Final    NO GROWTH 4 DAYS Performed at [facility], [address], [place], [place] [address]    Report Status PENDING   Incomplete  Culture, blood (routine x 2)     Status: Abnormal   Collection Time: 02/19/18  9:48 AM  Result Value Ref Range Status   Specimen Description   Final    BLOOD RIGHT FINGER Performed at [facility], [address] [place], [place] [address]    Special Requests   Final    BOTTLES DRAWN AEROBIC AND ANAEROBIC Blood Culture adequate volume Performed at [facility], [address] [place], [place] [address]    Culture  Setup Time   Final    IN BOTH AEROBIC AND ANAEROBIC BOTTLES GRAM POSITIVE COCCI IN CLUSTERS Ina CRITICAL RESULT CALLED TO, READ BACK BY AND VERIFIED WITH: C/MATT MCBANE @0220  02/20/18 [facility]    Culture (A)  Final    STAPHYLOCOCCUS SPECIES (COAGULASE NEGATIVE) THE SIGNIFICANCE OF ISOLATING THIS ORGANISM FROM A SINGLE SET OF BLOOD CULTURES WHEN MULTIPLE SETS ARE DRAWN IS UNCERTAIN. PLEASE NOTIFY THE MICROBIOLOGY DEPARTMENT WITHIN ONE WEEK IF SPECIATION AND SENSITIVITIES ARE REQUIRED. BACILLUS SPECIES Standardized susceptibility testing for this organism is not available. Performed at [facility], [place] [address] [place], [place] [address]    Report Status 02/22/2018 FINAL  Final  Blood Culture ID Panel (Reflexed)     Status: Abnormal   Collection Time: 02/19/18  9:48 AM  Result Value Ref Range Status   Enterococcus species NOT DETECTED NOT DETECTED Final   Listeria monocytogenes NOT DETECTED NOT DETECTED Final   Staphylococcus species DETECTED (A) NOT DETECTED Final    Comment: Methicillin (oxacillin) susceptible coagulase negative staphylococcus. Possible blood culture contaminant (unless isolated from more than one blood culture draw or clinical case suggests pathogenicity). No antibiotic treatment is indicated for blood  culture contaminants. CRITICAL RESULT CALLED TO, READ BACK BY AND VERIFIED WITH: C/MATT MCBANE @0220  02/20/18 [facility]    Staphylococcus aureus NOT DETECTED NOT DETECTED Final   Methicillin resistance NOT  DETECTED NOT DETECTED Final   Streptococcus species NOT DETECTED NOT DETECTED Final   Streptococcus agalactiae NOT DETECTED NOT DETECTED Final   Streptococcus pneumoniae NOT DETECTED NOT DETECTED Final   Streptococcus pyogenes NOT DETECTED NOT DETECTED Final   Acinetobacter baumannii NOT DETECTED NOT DETECTED Final   Enterobacteriaceae species NOT DETECTED NOT DETECTED Final   Enterobacter cloacae complex NOT DETECTED NOT DETECTED Final   Escherichia coli NOT DETECTED NOT DETECTED Final   Klebsiella oxytoca NOT DETECTED NOT DETECTED Final   Klebsiella pneumoniae NOT DETECTED NOT DETECTED Final   Proteus species NOT DETECTED NOT DETECTED Final   Serratia marcescens NOT DETECTED NOT DETECTED Final   Haemophilus influenzae NOT DETECTED NOT  DETECTED Final   Neisseria meningitidis NOT DETECTED NOT DETECTED Final   Pseudomonas aeruginosa NOT DETECTED NOT DETECTED Final   Candida albicans NOT DETECTED NOT DETECTED Final   Candida glabrata NOT DETECTED NOT DETECTED Final   Candida krusei NOT DETECTED NOT DETECTED Final   Candida parapsilosis NOT DETECTED NOT DETECTED Final   Candida tropicalis NOT DETECTED NOT DETECTED Final    Comment: Performed at [facility], [address] [place], [place] [address]  MRSA PCR Screening     Status: None   Collection Time: 02/19/18 12:36 PM  Result Value Ref Range Status   MRSA by PCR NEGATIVE NEGATIVE Final    Comment:        The GeneXpert MRSA Assay (FDA approved for NASAL specimens only), is one component of a comprehensive MRSA colonization surveillance program. It is not intended to diagnose MRSA infection nor to guide or monitor treatment for MRSA infections. Performed at [facility], [address] [place], [place] [address]   CULTURE, BLOOD (ROUTINE X 2) w Reflex to ID Panel     Status: None (Preliminary result)   Collection Time: 02/20/18 11:27 PM  Result Value Ref Range Status   Specimen Description BLOOD RIGHT  HAND  Final   Special Requests   Final    BOTTLES DRAWN AEROBIC AND ANAEROBIC Blood Culture results may not be optimal due to an excessive volume of blood received in culture bottles   Culture   Final    NO GROWTH 3 DAYS Performed at [facility], [address] [place], [place] [address]    Report Status PENDING  Incomplete  CULTURE, BLOOD (ROUTINE X 2) w Reflex to ID Panel     Status: None (Preliminary result)   Collection Time: 02/20/18 11:39 PM  Result Value Ref Range Status   Specimen Description BLOOD RIGHT ANTECUBITAL  Final   Special Requests   Final    BOTTLES DRAWN AEROBIC AND ANAEROBIC Blood Culture results may not be optimal due to an excessive volume of blood received in culture bottles   Culture   Final    NO GROWTH 3 DAYS Performed at [facility], [address] [place],  [address]    Report Status PENDING  Incomplete    Coagulation Studies: No results for input(s): LABPROT, INR in the last 72 hours.  Imaging: No results found.  Medications:  I have reviewed the patient's current medications. Prior to Admission:  Medications Prior to Admission  Medication Sig Dispense Refill Last Dose  . allopurinol (ZYLOPRIM) 100 MG tablet Take 100 mg by mouth daily.  10 07/11/2017 at 0800  . aspirin 81 MG EC tablet Take 81 mg by mouth daily.  10 07/11/2017 at 0800  . atorvastatin (LIPITOR) 40 MG tablet Take 2 tablets (80 mg total) by mouth daily at 6 PM. 30 tablet 0 Not Taking at Unknown time  . calcium acetate (PHOSLO) 667 MG capsule Take 1,334 mg by mouth 3 (three) times daily with meals.   07/11/2017 at 2000  . levETIRAcetam (KEPPRA) 1000 MG tablet Take 1 tablet (1,000 mg total) by mouth daily. (Patient taking differently: Take 750 mg by mouth daily. ) 60 tablet 1 07/11/2017 at 0800  . levETIRAcetam (KEPPRA) 250 MG tablet Take on the Days on Dialysis (Tues, Thurs, Sat) after Dialysis. (Patient taking differently: Take 250 mg by mouth daily. Take 250  mg on Dialysis days (Tues, Thurs, Sat) after Dialysis.) 60 tablet 1 07/10/2017 at 1500  .  metoprolol succinate (TOPROL-XL) 25 MG 24 hr tablet Take 12.5-25 mg by mouth daily. Take 25 mg by mouth on non-dialysis days and 12.5 mg on dialysis days (Tuesday, Thursday and Saturday) after dialysis.   07/11/2017 at 0800  . mirtazapine (REMERON) 15 MG tablet Take 15 mg by mouth at bedtime.    07/11/2017 at 2000  . acetaminophen (TYLENOL) 325 MG tablet Take 2 tablets (650 mg total) by mouth every 6 (six) hours as needed for mild pain (or Fever >/= 101). 30 tablet 0 PRN at PRN  . clopidogrel (PLAVIX) 75 MG tablet Take 1 tablet (75 mg total) by mouth daily with breakfast. 30 tablet 11 07/11/2017 at 0800  . heparin 100-0.45 UNIT/ML-% infusion Inject 1,000 Units/hr into the vein continuous. (Patient not taking: Reported on 02/19/2018) 250 mL 0 Not Taking at Unknown time  . losartan (COZAAR) 25 MG tablet Take 1 tablet (25 mg total) by mouth daily. (Patient not taking: Reported on 10/23/2016) 30 tablet 2 Not Taking at Unknown time  . multivitamin (RENA-VIT) TABS tablet Take 1 tablet by mouth at bedtime. (Patient not taking: Reported on 09/26/2016) 30 tablet 3 Not Taking at Unknown time  . nitroGLYCERIN (NITROSTAT) 0.4 MG SL tablet Place 1 tablet (0.4 mg total) under the tongue every 5 (five) minutes x 3 doses as needed for chest pain. (Patient not taking: Reported on 09/26/2016) 25 tablet 12 Not Taking at Unknown time   Scheduled: . aspirin EC  81 mg Oral Daily  . atorvastatin  80 mg Oral q1800  . calcium acetate  1,334 mg Oral TID WC  . Chlorhexidine Gluconate Cloth  6 each Topical Q0600  . clopidogrel  75 mg Oral Q breakfast  . heparin  5,000 Units Subcutaneous Q8H  . hydrALAZINE  25 mg Oral Q8H  . Influenza vac split quadrivalent PF  0.5 mL Intramuscular Tomorrow-1000  . insulin aspart  0-15 Units Subcutaneous TID WC  . insulin aspart  0-5 Units Subcutaneous QHS  . losartan  25 mg Oral Daily  . metoprolol succinate   12.5 mg Oral Q T,Th,Sat-1800  . metoprolol succinate  25 mg Oral Q M,W,F,Su-1800  . multivitamin  1 tablet Oral QHS  . sodium chloride flush  3 mL Intravenous Q12H    Patient seen and examined.  Clinical course and management discussed.  Necessary edits performed.  I agree with the above.  Assessment and plan of care developed and discussed below.    Assessment:  67 y.o. male with altered mental status likely multifactorial in the setting of acute stroke, metabolic and current infectious process now improving.  Patient more alert, able to converse and able to follow simple commands.   Plan 1. Continue dual antiplatelet therapy with Aspirin 81 mg/day and Plavix 75 mg /day  2. Continue statin 3. PT consult, OT consult, Speech consult 4. Agree with current medical treatment plan and disposition  This patient was staffed with Dr. Magda Paganini, Doy Mince who personally evaluated patient, reviewed documentation and agreed with assessment and plan of care as above.  Rufina Falco, DNP, FNP-BC Board certified Nurse Practitioner Neurology Department    LOS: 4 days   02/23/2018  1:13 PM   Alexis Goodell, MD Neurology [phone]  02/23/2018  2:25 PM

## 2018-02-23 NOTE — Progress Notes (Addendum)
Inpatient Rehabilitation  Per PT request, patient was screened by Gunnar Fusi for appropriateness for an Inpatient Acute Rehab consult.  At this time note working diagnosis of acute CVA.  OT recommending SNF and PT recommending CIR.  Discussed case with case Freight forwarder, Colletta Maryland.  Plan to place an Inpatient Rehab consult and visit with patient later today for assessment.  Call if questions.   Carmelia Roller., CCC/SLP Admission Coordinator  [place]  Cell [phone]

## 2018-02-23 NOTE — Progress Notes (Signed)
PT Cancellation Note  Patient Details Name: [name] MRN: [id number] DOB: 03/22/1951   Cancelled Treatment:    Reason Eval/Treat Not Completed: Fatigue/lethargy limiting ability to participate;Patient declined, no reason specified. Treatment attempted; pt soundly sleeping, but able to partially awaken with voice and touch. Pt only moans when encouraging PT session then returns to soundly sleeping with inability to awaken further. Re attempt at a later time/date as the schedule allows.    Larae Grooms, PTA 02/23/2018, 1:12 PM

## 2018-02-24 LAB — CULTURE, BLOOD (ROUTINE X 2)
Culture: NO GROWTH
Special Requests: ADEQUATE

## 2018-02-24 LAB — GLUCOSE, CAPILLARY
Glucose-Capillary: 140 mg/dL — ABNORMAL HIGH (ref 70–99)
Glucose-Capillary: 103 mg/dL — ABNORMAL HIGH (ref 70–99)
Glucose-Capillary: 121 mg/dL — ABNORMAL HIGH (ref 70–99)
Glucose-Capillary: 114 mg/dL — ABNORMAL HIGH (ref 70–99)

## 2018-02-24 LAB — RENAL FUNCTION PANEL
ALBUMIN: 3.3 g/dL — AB (ref 3.5–5.0)
Anion gap: 10 (ref 5–15)
BUN: 25 mg/dL — AB (ref 8–23)
CALCIUM: 9 mg/dL (ref 8.9–10.3)
CO2: 28 mmol/L (ref 22–32)
Chloride: 103 mmol/L (ref 98–111)
Creatinine, Ser: 12.49 mg/dL — ABNORMAL HIGH (ref 0.61–1.24)
GFR calc Af Amer: 4 mL/min — ABNORMAL LOW (ref >60)
GFR, EST NON AFRICAN AMERICAN: 4 mL/min — AB (ref >60)
GLUCOSE: 83 mg/dL (ref 70–99)
PHOSPHORUS: 2.3 mg/dL — AB (ref 2.5–4.6)
Potassium: 3.9 mmol/L (ref 3.5–5.1)
SODIUM: 141 mmol/L (ref 135–145)

## 2018-02-24 LAB — CBC
HCT: 30.8 % — ABNORMAL LOW (ref 40.0–52.0)
HEMOGLOBIN: 9.9 g/dL — AB (ref 13.0–18.0)
MCH: 24 pg — ABNORMAL LOW (ref 26.0–34.0)
MCHC: 32 g/dL (ref 32.0–36.0)
MCV: 75 fL — ABNORMAL LOW (ref 80.0–100.0)
Platelets: 173 10*3/uL (ref 150–440)
RBC: 4.1 MIL/uL — ABNORMAL LOW (ref 4.40–5.90)
RDW: 15.8 % — AB (ref 11.5–14.5)
WBC: 7.2 10*3/uL (ref 3.8–10.6)

## 2018-02-24 MED ORDER — INSULIN ASPART 100 UNIT/ML ~~LOC~~ SOLN
0.0000 [IU] | Freq: Three times a day (TID) | SUBCUTANEOUS | Status: DC
  Administered 2018-02-24 – 2018-02-27 (×5): 1 [IU] via SUBCUTANEOUS
  Filled 2018-02-24 (×5): qty 1

## 2018-02-24 MED ORDER — INSULIN ASPART 100 UNIT/ML ~~LOC~~ SOLN: 0.0000 [IU] | Freq: Every day | SUBCUTANEOUS | Status: DC

## 2018-02-24 NOTE — Progress Notes (Signed)
Pre hd 

## 2018-02-24 NOTE — Progress Notes (Signed)
[place], [place] 02/24/18  Subjective:  Patient seen at bedside. Wife and daughter were at bedside as well today. Overall still quite weak. Lethargic but is arousable. Patient due for dialysis today.   Objective:  Vital signs in last 24 hours:  Temp:  [98.6 F (37 C)-98.9 F (37.2 C)] 98.9 F (37.2 C) (09/10 1345) Pulse Rate:  [73-95] 95 (09/10 1400) Resp:  [15-17] 15 (09/10 1400) BP: (144-193)/(68-99) 193/97 (09/10 1400) SpO2:  [95 %-100 %] 100 % (09/10 1345) Weight:  [71.7 kg-72 kg] 72 kg (09/10 1345)  Weight change: -0.5 kg Filed Weights   02/23/18 0427 02/24/18 0450 02/24/18 1345  Weight: 72.6 kg 71.7 kg 72 kg    Intake/Output:    Intake/Output Summary (Last 24 hours) at 02/24/2018 1425 Last data filed at 02/24/2018 1255 Gross per 24 hour  Intake 240 ml  Output 0 ml  Net 240 ml     Physical Exam: General: NAD, laying in bed  HEENT Anicteric, moist mucus membranes  Neck supple  Pulm/lungs Coarse b/l, BS present  CVS/Heart S1S2 no rubs  Abdomen:  Soft, NTND BS present  Extremities: no edema  Neurologic: Able to follow a few basic commands  Skin: No rashes  Access: Left forearm AVG, some swelling around the graft       Basic Metabolic Panel:  Recent Labs  Lab 02/19/18 0700 02/20/18 0454  NA 137 140  K 3.8 4.2  CL 93* 98  CO2 27 28  GLUCOSE 161* 126*  BUN 43* 22  CREATININE 12.89* 8.04*  CALCIUM 10.3 9.1     CBC: Recent Labs  Lab 02/19/18 0700  WBC 10.6  NEUTROABS 4.3  HGB 13.0  HCT 40.4  MCV 75.4*  PLT 195      Lab Results  Component Value Date   HEPBSAG Negative 09/05/2016      Microbiology:  Recent Results (from the past 240 hour(s))  Culture, blood (routine x 2)     Status: None   Collection Time: 02/19/18  9:17 AM  Result Value Ref Range Status   Specimen Description BLOOD RIGHT ARM  Final   Special Requests   Final    BOTTLES DRAWN AEROBIC AND ANAEROBIC Blood Culture adequate volume    Culture   Final    NO GROWTH 5 DAYS Performed at [facility], [address] [place], [place] [address]    Report Status 02/24/2018 FINAL  Final  Culture, blood (routine x 2)     Status: Abnormal   Collection Time: 02/19/18  9:48 AM  Result Value Ref Range Status   Specimen Description   Final    BLOOD RIGHT FINGER Performed at [facility], [address] [place], [place] [address]    Special Requests   Final    BOTTLES DRAWN AEROBIC AND ANAEROBIC Blood Culture adequate volume Performed at [facility], [address] [place],  [address]    Culture  Setup Time   Final    IN BOTH AEROBIC AND ANAEROBIC BOTTLES GRAM POSITIVE COCCI IN CLUSTERS GRAM POSITIVE RODS CRITICAL RESULT CALLED TO, READ BACK BY AND VERIFIED WITH: C/MATT MCBANE @0220  02/20/18 [facility]    Culture (A)  Final    STAPHYLOCOCCUS SPECIES (COAGULASE NEGATIVE) THE SIGNIFICANCE OF ISOLATING THIS ORGANISM FROM A SINGLE SET OF BLOOD CULTURES WHEN MULTIPLE SETS ARE DRAWN IS UNCERTAIN. PLEASE NOTIFY THE MICROBIOLOGY DEPARTMENT WITHIN ONE WEEK IF SPECIATION AND SENSITIVITIES ARE REQUIRED. BACILLUS SPECIES Standardized susceptibility testing for  this organism is not available. Performed at [facility], [place] [address], [place], [place] [address]    Report Status 02/22/2018 FINAL  Final  Blood Culture ID Panel (Reflexed)     Status: Abnormal   Collection Time: 02/19/18  9:48 AM  Result Value Ref Range Status   Enterococcus species NOT DETECTED NOT DETECTED Final   Listeria monocytogenes NOT DETECTED NOT DETECTED Final   Staphylococcus species DETECTED (A) NOT DETECTED Final    Comment: Methicillin (oxacillin) susceptible coagulase negative staphylococcus. Possible blood culture contaminant (unless isolated from more than one blood culture draw or clinical case suggests pathogenicity). No antibiotic treatment is indicated for blood  culture contaminants. CRITICAL RESULT CALLED TO,  READ BACK BY AND VERIFIED WITH: C/MATT MCBANE @0220  02/20/18 [facility]    Staphylococcus aureus NOT DETECTED NOT DETECTED Final   Methicillin resistance NOT DETECTED NOT DETECTED Final   Streptococcus species NOT DETECTED NOT DETECTED Final   Streptococcus agalactiae NOT DETECTED NOT DETECTED Final   Streptococcus pneumoniae NOT DETECTED NOT DETECTED Final   Streptococcus pyogenes NOT DETECTED NOT DETECTED Final   Acinetobacter baumannii NOT DETECTED NOT DETECTED Final   Enterobacteriaceae species NOT DETECTED NOT DETECTED Final   Enterobacter cloacae complex NOT DETECTED NOT DETECTED Final   Escherichia coli NOT DETECTED NOT DETECTED Final   Klebsiella oxytoca NOT DETECTED NOT DETECTED Final   Klebsiella pneumoniae NOT DETECTED NOT DETECTED Final   Proteus species NOT DETECTED NOT DETECTED Final   Serratia marcescens NOT DETECTED NOT DETECTED Final   Haemophilus influenzae NOT DETECTED NOT DETECTED Final   Neisseria meningitidis NOT DETECTED NOT DETECTED Final   Pseudomonas aeruginosa NOT DETECTED NOT DETECTED Final   Candida albicans NOT DETECTED NOT DETECTED Final   Candida glabrata NOT DETECTED NOT DETECTED Final   Candida krusei NOT DETECTED NOT DETECTED Final   Candida parapsilosis NOT DETECTED NOT DETECTED Final   Candida tropicalis NOT DETECTED NOT DETECTED Final    Comment: Performed at [facility], [address] [place], [place] [address]  MRSA PCR Screening     Status: None   Collection Time: 02/19/18 12:36 PM  Result Value Ref Range Status   MRSA by PCR NEGATIVE NEGATIVE Final    Comment:        The GeneXpert MRSA Assay (FDA approved for NASAL specimens only), is one component of a comprehensive MRSA colonization surveillance program. It is not intended to diagnose MRSA infection nor to guide or monitor treatment for MRSA infections. Performed at [facility], [address] [place], [place] [address]   CULTURE, BLOOD (ROUTINE X 2) w Reflex to ID  Panel     Status: None (Preliminary result)   Collection Time: 02/20/18 11:27 PM  Result Value Ref Range Status   Specimen Description BLOOD RIGHT HAND  Final   Special Requests   Final    BOTTLES DRAWN AEROBIC AND ANAEROBIC Blood Culture results may not be optimal due to an excessive volume of blood received in culture bottles   Culture   Final    NO GROWTH 4 DAYS Performed at [facility], [address], [place], [place] [address]    Report Status PENDING  Incomplete  CULTURE, BLOOD (ROUTINE X 2) w Reflex to ID Panel     Status: None (Preliminary result)   Collection Time: 02/20/18 11:39 PM  Result Value Ref Range Status   Specimen Description BLOOD RIGHT ANTECUBITAL  Final   Special Requests   Final  BOTTLES DRAWN AEROBIC AND ANAEROBIC Blood Culture results may not be optimal due to an excessive volume of blood received in culture bottles   Culture   Final    NO GROWTH 4 DAYS Performed at [facility], [address] [place], [place] [address]    Report Status PENDING  Incomplete    Coagulation Studies: No results for input(s): LABPROT, INR in the last 72 hours.  Urinalysis: No results for input(s): COLORURINE, LABSPEC, PHURINE, GLUCOSEU, HGBUR, BILIRUBINUR, KETONESUR, PROTEINUR, UROBILINOGEN, NITRITE, LEUKOCYTESUR in the last 72 hours.  Invalid input(s): APPERANCEUR    Imaging: No results found.   Medications:   . aspirin EC  81 mg Oral Daily  . atorvastatin  80 mg Oral q1800  . azithromycin  500 mg Oral Daily  . calcium acetate  1,334 mg Oral TID WC  . Chlorhexidine Gluconate Cloth  6 each Topical Q0600  . clopidogrel  75 mg Oral Q breakfast  . famotidine  20 mg Oral Q48H  . heparin  5,000 Units Subcutaneous Q8H  . hydrALAZINE  25 mg Oral Q8H  . insulin aspart  0-5 Units Subcutaneous QHS  . insulin aspart  0-9 Units Subcutaneous TID WC  . levETIRAcetam  250 mg Oral Once per day on Tue Thu Sat  . levETIRAcetam  750 mg Oral Daily  .  losartan  25 mg Oral Daily  . metoprolol succinate  12.5 mg Oral Q T,Th,Sat-1800  . metoprolol succinate  25 mg Oral Q M,W,F,Su-1800  . multivitamin  1 tablet Oral QHS  . sodium chloride flush  3 mL Intravenous Q12H      Assessment/ Plan:  67 y.o. 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   TTS [facility] [address]/ 70 kg/   1. ESRD - Patient due for hemodialysis today.  Orders have been prepared.  2. AOCKD -hemoglobin 13.  Hold Epogen at this time.  3. SHPTH -check serum phosphorus today.  4. Altered mental status/right basal ganglia infarct - still quite weak.  He will likely need inpatient rehabilitation.  5.  Swelling around AV graft Hematoma  6. Bacteremia - staph coag neg - likely contaminant        LOS: 5 Ligia Duguay 9/10/20192:25 PM  [place], [place]  Note: This note was prepared with Dragon dictation. Any transcription errors are unintentional

## 2018-02-24 NOTE — Progress Notes (Signed)
Inpatient Rehabilitation Admissions Coordinator  Reviewed current therapy progress today. Patient is max assist to roll in bed, fatigues quickly, and difficulty following instructions. . Patient currently not at a level to be able to tolerate the intensity of an inpt rehab admission. I will contact RN CM, Colletta Maryland, and follow at a distance.  Danne Baxter, RN, MSN Rehab Admissions Coordinator [phone] 02/24/2018 1:40 PM

## 2018-02-24 NOTE — Progress Notes (Signed)
SLP Cancellation Note  Patient Details Name: [name] MRN: [id number] DOB: [birth year]   Cancelled treatment:       Reason Eval/Treat Not Completed: Patient at procedure or test/unavailable(chart reviewed; pt at HD). Pt remains at HD out of room this afternoon. Will f/u w/ pt in the morning for dysphagia tx, toleration of diet, trials to upgrade to Nectar consistency liquids in his diet.     Orinda Kenner, MS, CCC-SLP [facility] [place] 02/24/2018, 3:58 PM

## 2018-02-24 NOTE — Care Management Important Message (Signed)
Copy of signed Medicare IM left with patient's family in room.

## 2018-02-24 NOTE — Progress Notes (Signed)
HD initiated via L AVG (reversed) using 15g needles x2 without issue. No heparin treatment. 2k bath with labs pending. Patient is lethargic but easily aroused. Shakes head to answer few questions. Denies pain. Stable for dialysis.

## 2018-02-24 NOTE — Progress Notes (Signed)
[place] [facility] [place] NAME: [name]    MR#:  [id number]  DATE OF BIRTH:  10/16/1950  SUBJECTIVE:  CHIEF COMPLAINT:  No chief complaint on file. Patient is demented or confused.  Noncommunicative. REVIEW OF SYSTEMS:    Review of Systems  Unable to perform ROS: Mental status change    DRUG ALLERGIES:   Allergies  Allergen Reactions  . Ivp Dye [Iodinated Diagnostic Agents] Other (See Comments)    Pt denied    VITALS:  Blood pressure (!) 169/68, pulse 74, temperature 98.6 F (37 C), temperature source Oral, resp. rate 16, height 5\' 5"  (1.651 m), weight 71.7 kg, SpO2 100 %.  PHYSICAL EXAMINATION:  GENERAL:  67 y.o.-year-old patient lying in the bed with no acute distress.  EYES: Pupils equal, round, reactive to light and accommodation. No scleral icterus. Extraocular muscles intact.  HEENT: Head atraumatic, normocephalic.  NECK:  Supple, no jugular venous distention. No thyroid enlargement, no tenderness.  LUNGS: Normal breath sounds bilaterally, no wheezing, rales,rhonchi or crepitation. No use of accessory muscles of respiration.  CARDIOVASCULAR: S1, S2 normal. No murmurs, rubs, or gallops.  ABDOMEN: Soft, nontender, nondistended. Bowel sounds present. No organomegaly or mass.  EXTREMITIES: No pedal edema, cyanosis, or clubbing.  NEUROLOGIC: Unable to exam. PSYCHIATRIC: The patient is confused and noncommunicative. SKIN: No obvious rash, lesion, or ulcer.   Physical Exam LABORATORY PANEL:   CBC Recent Labs  Lab 02/19/18 0700  WBC 10.6  HGB 13.0  HCT 40.4  PLT 195   ------------------------------------------------------------------------------------------------------------------  Chemistries  Recent Labs  Lab 02/19/18 0700 02/20/18 0454  NA 137 140  K 3.8 4.2  CL 93* 98  CO2 27 28  GLUCOSE 161* 126*  BUN 43* 22  CREATININE 12.89* 8.04*  CALCIUM 10.3 9.1  AST 23  --   ALT 14  --   ALKPHOS 82  --   BILITOT  1.3*  --    ------------------------------------------------------------------------------------------------------------------  Cardiac Enzymes Recent Labs  Lab 02/19/18 2220 02/20/18 0454  TROPONINI 0.07* 0.09*   ------------------------------------------------------------------------------------------------------------------  RADIOLOGY:  No results found.  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  Echocardiogram: LV EF: 30% -   35%, carotid Dopplers is unremarkable.  Aspiration/fall/skin care precautions while in house, and continue close medical monitoring. Continue aspirin 81 mg/day and Plavix 75 mg per Dr. Doy Mince. Possible inpatient rehab placement.  *Acute gram-positive cocci blood infection He was on IV vancomycin, repeated blood cultures are negative so far.  Discontinued vancomycin if repeated blood culture is negative per ID Dr. Steva Ready.  *Acute probable left community-acquired pneumonia Continue empiric Rocephin/azithromycin, pneumonia protocol.  *Chronic ESRD on HD TTS Nephrology following for hemodialysis needs  *History of peptic ulcer disease PPI daily  *Acute accelerated hypertension Continue Lopressor, and hydralazine p.o. and IV as needed. Controlled.  *History of seizure disorder Continue Keppra, seizure precautions  Hypoglycemia.  D50 IV and monitor blood glucose.  Improved.  Diabetes 2.  Changed to sensitive sliding scale.  Acute metabolic encephalopathy and possible underlying dementia. Per his wife, the patient has confusion at baseline, but worse this time. Aspiration fall precaution.  Very poor prognosis.  Palliative care staff is following.  Family is not ready for  hospice. All the records are reviewed and case discussed with  Care Management/Social Workerr. Management plans discussed with the patient, his wife and daughter and they are in agreement.  CODE STATUS: dnr  TOTAL TIME TAKING CARE OF THIS PATIENT: 32 minutes.  POSSIBLE D/C IN 2 DAYS, DEPENDING ON CLINICAL CONDITION.   Demetrios Loll M.D on 02/24/2018   Between 7am to 6pm - Pager - [phone]  After 6pm go to [email or web address] - password [email or web address] [facility]  Office  [phone]  CC: Primary care physician; [name], MD  Note: This dictation was prepared with Dragon dictation along with smaller phrase technology. Any transcriptional errors that result from this process are unintentional.

## 2018-02-24 NOTE — Progress Notes (Signed)
Inpatient Diabetes Program Recommendations  AACE/ADA: New Consensus Statement on Inpatient Glycemic Control (2019)  Target Ranges:  Prepandial:   less than 140 mg/dL      Peak postprandial:   less than 180 mg/dL (1-2 hours)      Critically ill patients:  140 - 180 mg/dL   Results for LOWEN, BARRINGER (MRN [id number]) as of 02/24/2018 09:22  Ref. Range 02/23/2018 11:37 02/23/2018 16:51 02/23/2018 17:46 02/23/2018 22:08 02/24/2018 08:04 02/24/2018 09:14  Glucose-Capillary Latest Ref Range: 70 - 99 mg/dL 138 (H)  Novolog 2 units 69 (L) 164 (H) 70 140 (H) 103 (H)  Results for AJDIN, MACKE (MRN [id number]) as of 02/24/2018 09:22  Ref. Range 02/19/2018 15:44  Hemoglobin A1C Latest Ref Range: 4.8 - 5.6 % 6.3 (H)   Review of Glycemic Control  Diabetes history: DM Outpatient Diabetes medications: None Current orders for Inpatient glycemic control: Novolog 0-15 units TID with meals, Novolog 0-5 units QHS  Inpatient Diabetes Program Recommendations:  Correction (SSI): Please consider decreasing Novolog correction to sensitive scale (0-9 units).  Thanks, Barnie Alderman, RN, MSN, CDE Diabetes Coordinator Inpatient Diabetes Program [phone] (Team Pager from 8am to 5pm)

## 2018-02-24 NOTE — Progress Notes (Signed)
Physical Therapy Treatment Patient Details Name: [name] MRN: [id number] DOB: 01-Oct-1950 Today's Date: 02/24/2018    History of Present Illness Pt. is a 67 y.o. male who presented to the ER from hemodialysis with altered mental status. Pt. was admitted for encephalopathy, acute probable left community-acquired pneumonia, and end-stage renal disease due for hemodialysis today    PT Comments    Pt awake with family in room.  Pt stated he is feeling better today.  Participated in exercises as described below.  Pt with difficulty following directions at times for exercises.  Bed mobility skills attempted but pt is unable and at times resists reaching across for rails R and L.  Pt seems to have some difficulty eye tracking.  He requires max a x 1 to attempt rolling and he unable to hold position or assist in a meaningful way.  Fatigues quickly.  Initial recommendations for CIR noted.  Pt may be more appropriate for SNF  if participation ability/tolerance does not increase.  He may have a difficult time tolerating therapy requirements for CIR at this time.  Will continue to access recommendations during session.     Follow Up Recommendations  CIR     Equipment Recommendations  None recommended by PT    Recommendations for Other Services       Precautions / Restrictions Precautions Precautions: Fall Restrictions Weight Bearing Restrictions: No    Mobility  Bed Mobility Overal bed mobility: Needs Assistance Bed Mobility: Rolling Rolling: Max assist         General bed mobility comments: resists rolling left and right - further mobility deferred as pt resisting movements for pt and staff safety  Transfers                 General transfer comment: deferred  Ambulation/Gait             General Gait Details: unable   Stairs             Wheelchair Mobility    Modified Rankin (Stroke Patients Only)       Balance                                             Cognition   Behavior During Therapy: Flat affect;Restless Overall Cognitive Status: Difficult to assess                                 General Comments: Pt is not very verbal today      Exercises Other Exercises Other Exercises: BLE AAROM for ankle pumps. heels slides, ab/add, SLR x 10    General Comments        Pertinent Vitals/Pain Pain Assessment: No/denies pain    Home Living                      Prior Function            PT Goals (current goals can now be found in the care plan section) Progress towards PT goals: Not progressing toward goals - comment    Frequency    7X/week      PT Plan Current plan remains appropriate    Co-evaluation              AM-PAC PT "6 Clicks" Daily Activity  Outcome Measure  Difficulty turning over in bed (including adjusting bedclothes, sheets and blankets)?: Unable Difficulty moving from lying on back to sitting on the side of the bed? : Unable Difficulty sitting down on and standing up from a chair with arms (e.g., wheelchair, bedside commode, etc,.)?: Unable Help needed moving to and from a bed to chair (including a wheelchair)?: Total Help needed walking in hospital room?: Total Help needed climbing 3-5 steps with a railing? : Total 6 Click Score: 6    End of Session   Activity Tolerance: Patient limited by lethargy Patient left: in bed;with bed alarm set;with call bell/phone within reach;with family/visitor present Nurse Communication: Mobility status Hemiplegia - Right/Left: Left Hemiplegia - dominant/non-dominant: Dominant Hemiplegia - caused by: Cerebral infarction     Time: 9597-4718 PT Time Calculation (min) (ACUTE ONLY): 13 min  Charges:  $Therapeutic Exercise: 8-22 mins                    Chesley Noon, PTA 02/24/18, 11:25 AM

## 2018-02-24 NOTE — Progress Notes (Signed)
On call nephrology was notified that daily keppra 750 mg was after dialysis at 1800 and pt has a 250 mg dose of keppra due at 2100. Verbal order to continue with the extra dose of keppra 250 mg at 2100. RN will relay the message to on coming RN.   [name] [employer]

## 2018-02-24 NOTE — Care Management (Signed)
Danne Baxter, Rehab Admissions Coordinator notified RNCM that patient is not currently appropriate for CIR.  If patient medically cleared will have to pursue SNF or home with home health.   Prior to discharge patient will have to tolerate HD SITTING, that way he can continue his outpatient HD.  If patient discharges home will need hoyer lift.  CSW updated.

## 2018-02-25 LAB — GLUCOSE, CAPILLARY
Glucose-Capillary: 108 mg/dL — ABNORMAL HIGH (ref 70–99)
Glucose-Capillary: 102 mg/dL — ABNORMAL HIGH (ref 70–99)
Glucose-Capillary: 130 mg/dL — ABNORMAL HIGH (ref 70–99)
Glucose-Capillary: 132 mg/dL — ABNORMAL HIGH (ref 70–99)
Glucose-Capillary: 119 mg/dL — ABNORMAL HIGH (ref 70–99)

## 2018-02-25 LAB — CULTURE, BLOOD (ROUTINE X 2)
CULTURE: NO GROWTH
Culture: NO GROWTH

## 2018-02-25 NOTE — Clinical Social Work Placement (Signed)
   CLINICAL SOCIAL WORK PLACEMENT  NOTE  Date:  02/25/2018  Patient Details  Name: [name] MRN: [id number] Date of Birth: [birth year]  Clinical Social Work is seeking post-discharge placement for this patient at the [place] level of care (*CSW will initial, date and re-position this form in  chart as items are completed):  Yes   Patient/family provided with [facility] Work Department's list of facilities offering this level of care within the geographic area requested by the patient (or if unable, by the patient's family).  Yes   Patient/family informed of their freedom to choose among providers that offer the needed level of care, that participate in Medicare, Medicaid or managed care program needed by the patient, have an available bed and are willing to accept the patient.  Yes   Patient/family informed of [facility]'s ownership interest in [facility] and [facility], as well as of the fact that they are under no obligation to receive care at these facilities.  PASRR submitted to EDS on       PASRR number received on       Existing PASRR number confirmed on 02/25/18     FL2 transmitted to all facilities in geographic area requested by pt/family on 02/25/18     FL2 transmitted to all facilities within larger geographic area on       Patient informed that his/her managed care company has contracts with or will negotiate with certain facilities, including the following:        Yes   Patient/family informed of bed offers received.  Patient chooses bed at       Physician recommends and patient chooses bed at      Patient to be transferred to   on  .  Patient to be transferred to facility by       Patient family notified on   of transfer.  Name of family member notified:        PHYSICIAN       Additional Comment:    _______________________________________________ Robin Petrakis, Veronia Beets, LCSW 02/25/2018, 1:31 PM

## 2018-02-25 NOTE — NC FL2 (Signed)
[place] LEVEL OF CARE SCREENING TOOL     IDENTIFICATION  Patient Name: [name] Birthdate: 01-05-1951 Sex: male Admission Date (Current Location): 02/19/2018  [place] and [place] Number:  Engineering geologist and Address:  [facility], [address] [place], [place] [address]      Provider Number: [id number]  Attending Physician Name and Address:  Demetrios Loll, MD  Relative Name and Phone Number:       Current Level of Care: Hospital Recommended Level of Care: [place] Prior Approval Number:    Date Approved/Denied:   PASRR Number: [id number])  Discharge Plan: SNF    Current Diagnoses: Patient Active Problem List   Diagnosis Date Noted  . CVA (cerebral vascular accident) ([place])   . Palliative care by specialist   . Acute encephalopathy 02/19/2018  . Calculus of common duct   . Fitting and adjustment of gastrointestinal appliance and device   . Elevated LFTs 09/16/2016  . Calculus of bile duct without cholecystitis with obstruction   . Choledocholithiasis   . Diabetes ([place]) 09/04/2016  . HTN (hypertension) 09/04/2016  . GERD (gastroesophageal reflux disease) 09/04/2016  . Chronic diastolic CHF (congestive heart failure) ([place]) 09/04/2016  . Transaminitis 09/04/2016  . Sepsis ([place]) 09/04/2016  . CAP (community acquired pneumonia) 09/04/2016  . Encephalopathy acute   . HCAP (healthcare-associated pneumonia) 07/24/2016  . Bradycardia 06/25/2016  . Alteration in self-care ability 03/05/2016  . Hx of subdural hematoma 03/05/2016  . 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 NSTEMI (non-ST elevated myocardial infarction) ([place]) 02/26/2016  . H/O non-ST elevation myocardial infarction (NSTEMI) 01/10/2016  . S/P coronary artery stent placement 01/10/2016  . Depression, major, single episode, complete remission ([place]) 07/22/2015  . Acute respiratory failure with hypoxia ([place]) 02/14/2015  . Syncope 11/05/2014  . Acute cystitis  with hematuria 10/11/2014  . Acute subdural hematoma ([place]) 10/10/2014  . Anemia in chronic kidney disease (CKD) 10/07/2014  . ESRD on hemodialysis ([place]) 10/07/2014  . Mixed hyperlipidemia 10/07/2014  . 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 Contaminant given to patient   . Shortness of breath   . Viridans streptococci infection   . Polymicrobial bacterial infection   . Bacteremia   . 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 ESRD on dialysis ([place])   . Type 1 diabetes mellitus with other diabetic kidney complication ([place])   . Acute coronary syndrome ([place]) 09/29/2014  . Anemia associated with chronic renal failure 03/25/2014  . Nausea & vomiting 03/25/2014  . Gout 03/17/2012  . H/O: upper GI bleed 03/17/2012  . Hyperlipidemia, unspecified 03/17/2012  . Kidney transplant status, cadaveric 07/16/2002    Orientation RESPIRATION BLADDER Height & Weight     Self  Normal Continent Weight: 156 lb 8.4 oz (71 kg) Height:  5\' 5"  (165.1 cm)  BEHAVIORAL SYMPTOMS/MOOD NEUROLOGICAL BOWEL NUTRITION STATUS  (none) (none) Incontinent Diet(Diet: DYS 1 nectar thick. )  AMBULATORY STATUS COMMUNICATION OF NEEDS Skin   Extensive Assist Verbally Normal                       Personal Care Assistance Level of Assistance  Bathing, Feeding, Dressing Bathing Assistance: Limited assistance Feeding assistance: Independent Dressing Assistance: Limited assistance Total Care Assistance: Maximum assistance   Functional Limitations Info  Sight, Hearing, Speech Sight Info: Adequate Hearing Info: Adequate Speech Info: Adequate    SPECIAL CARE FACTORS FREQUENCY  PT (By licensed PT), OT (By licensed OT)(Dialysis:  [address] TTS. )     PT Frequency: (5) OT Frequency: (5)            Contractures Contractures Info: Not present    Additional Factors Info  Code Status, Allergies Code Status Info: (DNR ) Allergies Info: (Ivp Dye Iodinated Diagnostic  Agents)           Current Medications (02/25/2018):  This is the current hospital active medication list Current Facility-Administered Medications  Medication Dose Route Frequency Provider Last Rate Last Dose  . 0.9 %  sodium chloride infusion  250 mL Intravenous PRN Salary, Holly Bodily D, MD 10 mL/hr at 02/24/18 1821    . acetaminophen (TYLENOL) tablet 650 mg  650 mg Oral Q6H PRN Salary, Montell D, MD       Or  . acetaminophen (TYLENOL) suppository 650 mg  650 mg Rectal Q6H PRN Salary, Montell D, MD      . aspirin EC tablet 81 mg  81 mg Oral Daily Salary, Montell D, MD   81 mg at 02/24/18 1758  . atorvastatin (LIPITOR) tablet 80 mg  80 mg Oral q1800 Salary, Holly Bodily D, MD   80 mg at 02/24/18 1802  . azithromycin (ZITHROMAX) tablet 500 mg  500 mg Oral Daily Demetrios Loll, MD   500 mg at 02/24/18 1758  . calcium acetate (PHOSLO) capsule 1,334 mg  1,334 mg Oral TID WC Salary, Montell D, MD   1,334 mg at 02/25/18 0831  . cefTRIAXone (ROCEPHIN) 1 g in sodium chloride 0.9 % 100 mL IVPB  1 g Intravenous Q24H Demetrios Loll, MD 200 mL/hr at 02/24/18 1820 1 g at 02/24/18 1820  . Chlorhexidine Gluconate Cloth 2 % PADS 6 each  6 each Topical Q0600 Murlean Iba, MD   6 each at 02/22/18 [phone]  . clopidogrel (PLAVIX) tablet 75 mg  75 mg Oral Q breakfast Salary, Montell D, MD   75 mg at 02/25/18 0831  . famotidine (PEPCID) tablet 20 mg  20 mg Oral Q48H Demetrios Loll, MD   20 mg at 02/24/18 1759  . heparin injection 5,000 Units  5,000 Units Subcutaneous Q8H Salary, Montell D, MD   5,000 Units at 02/25/18 0526  . hydrALAZINE (APRESOLINE) injection 10 mg  10 mg Intravenous Q4H PRN Salary, Holly Bodily D, MD   10 mg at 02/23/18 0517  . hydrALAZINE (APRESOLINE) tablet 25 mg  25 mg Oral Q8H Demetrios Loll, MD   25 mg at 02/25/18 0527  . insulin aspart (novoLOG) injection 0-5 Units  0-5 Units Subcutaneous QHS Demetrios Loll, MD      . insulin aspart (novoLOG) injection 0-9 Units  0-9 Units Subcutaneous TID WC Demetrios Loll, MD   1 Units at  02/24/18 1157  . levETIRAcetam (KEPPRA) tablet 250 mg  250 mg Oral Once per day on Tue Thu Sat Demetrios Loll, MD   250 mg at 02/25/18 0353  . levETIRAcetam (KEPPRA) tablet 750 mg  750 mg Oral Daily Demetrios Loll, MD   750 mg at 02/24/18 1800  . losartan (COZAAR) tablet 25 mg  25 mg Oral Daily Salary, Montell D, MD   25 mg at 02/24/18 1801  . metoprolol succinate (TOPROL-XL) 24 hr tablet 12.5 mg  12.5 mg Oral Q T,Th,Sat-1800 Salary, Montell D, MD   12.5 mg at 02/24/18 1802  . metoprolol succinate (TOPROL-XL) 24 hr tablet 25 mg  25 mg Oral Q M,W,F,Su-1800 Salary, Montell D, MD      . multivitamin (RENA-VIT) tablet 1 tablet  1  tablet Oral QHS Gorden Harms, MD   1 tablet at 02/22/18 2035  . nitroGLYCERIN (NITROSTAT) SL tablet 0.4 mg  0.4 mg Sublingual Q5 Min x 3 PRN Salary, Montell D, MD      . ondansetron (ZOFRAN) tablet 4 mg  4 mg Oral Q6H PRN Salary, Montell D, MD       Or  . ondansetron (ZOFRAN) injection 4 mg  4 mg Intravenous Q6H PRN Salary, Montell D, MD      . polyethylene glycol (MIRALAX / GLYCOLAX) packet 17 g  17 g Oral Daily PRN Salary, Montell D, MD      . sodium chloride flush (NS) 0.9 % injection 3 mL  3 mL Intravenous Q12H Salary, Montell D, MD   3 mL at 02/25/18 0400     Discharge Medications: Please see discharge summary for a list of discharge medications.  Relevant Imaging Results:  Relevant Lab Results:   Additional Information (SSN: [id number])  Fredrick Geoghegan, Ver[place] Beets, LCSW

## 2018-02-25 NOTE — Progress Notes (Addendum)
Speech Language Pathology Treatment: Dysphagia  Patient Details Name: [name] MRN: [id number] DOB: 08-19-1950 Today's Date: 02/25/2018 Time: 1120-1204 SLP Time Calculation (min) (ACUTE ONLY): 44 min  Assessment / Plan / Recommendation Clinical Impression  Pt seen for ongoing assessment of toleration of diet; education on diet consistency and strategies to support pt during feeding at meals. Wife present. Pt was mostly nonverbal except for 2-3 single word "yes" responses. He appeared extremely fatigued and did NOT help to position himself in the bed(appeared to have decreased tone in even in H&N area and noted pt leaned to his Left side requiring support). Eyes closed intermittently. Pt given cues throughout. 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   Pt presented w/ trials of Nectar liquids via TSP and purees d/t his presentation. Trials to upgrade diet were NOT given d/t concern for risk of aspiration, dysphagia, and poor ability to safely meet his nutritional needs. Pt required TOTAL assistance w/ feeding of trials. Moderate+ verbal cues from SLP and Wife. Pt opened mouth to accept few po trials of the Nectar liquids and purees w/ adequate oral phase management and timely A-P transfer; oral clearing was achieved w/ time and visual checking, verbal cues. Pt only opened his mouth slightly to accept boluses and did not seem to want the po trials as we continued. He appeared fatigued and disinterested. After ~10 trials, SLP stopped and pt nodded he wanted to rest. Education given to Wife on aspiration precautions and verbal cues during feeding as well as need to check for oral clearing during oral intake.  Recommend continuing w/ current diet d/t pt's declined Cognitive status and overall awareness w/ the task of oral intake as this can impact safety w/ oral intake thus  increasing risk for aspiration. Wife agreed. NSG updated.  Recommend Dysphagia level 1 (PUREE) w/ NECTAR consistency liquids; aspiration precautions; Pills in Puree.    HPI HPI: Pt is a 67 y.o. male with a known history of multiple medical issues including Seizures, Lacunar Infarcts, ERSD w/ HD, multifactorial Cognitive Impairment presenting from hemodialysis prior to receiving dialysis for altered mental status, per wife the patient has been altered mentally for 2 weeks, unable to walk, lying in bed, limited verbal output, in the emergency room patient was found to have blood pressure in the 190s, normal blood gas, creatinine 12, CT noted for old strokes/no acute process, chest x-ray noted for questionable left pneumonia, troponin 0.08, CBC unimpressive, patient is DNR and a poor historian due to encephalopathy/obtundation, patient now be admitted for acute/subacute encephalopathy, acute probable left community-acquired pneumonia, and end-stage renal disease. Pt was previously NPO for ~2-3 days d/t declined Cognitive status and alertness. Pt is intermittently awake/drowsy per NSG report.       SLP Plan  Continue with current plan of care       Recommendations  Diet recommendations: Dysphagia 1 (puree);Nectar-thick liquid Liquids provided via: Teaspoon;Cup Medication Administration: Crushed with puree(as able or in liquid form mixed in puree) Supervision: Full supervision/cueing for compensatory strategies;Trained caregiver to feed patient Compensations: Minimize environmental distractions;Slow rate;Small sips/bites;Lingual sweep for clearance of pocketing;Multiple dry swallows after each bite/sip;Follow solids with liquid Postural Changes and/or Swallow Maneuvers: Seated upright 90 degrees;Upright 30-60 min after meal                General recommendations: (Dietician f/u) Oral Care Recommendations: Oral care BID;Staff/trained caregiver to provide  oral care Follow up Recommendations:  Skilled Nursing facility SLP Visit Diagnosis: Dysphagia, oropharyngeal phase (R13.12) Plan: Continue with current plan of care       [name], [place], CCC-SLP [name] 02/25/2018, 2:34 PM

## 2018-02-25 NOTE — Clinical Social Work Note (Signed)
Clinical Social Work Assessment  Patient Details  Name: [name] MRN: [id number] Date of Birth: 01-May-1951  Date of referral:  02/25/18               Reason for consult:  Facility Placement                Permission sought to share information with:  Chartered certified accountant granted to share information::  Yes, Verbal Permission Granted  Name::      [name]::   [name]   Relationship::     Contact Information:     Housing/Transportation Living arrangements for the past 2 months:  [place] of Information:  Adult Children Patient Interpreter Needed:  None Criminal Activity/Legal Involvement Pertinent to Current Situation/Hospitalization:  No - Comment as needed Significant Relationships:  Adult Children, Spouse Lives with:  Spouse Do you feel safe going back to the place where you live?  Yes Need for family participation in patient care:  Yes (Comment)  Care giving concerns:  Patient lives in [place] with his wife [name].    Social Worker assessment / plan:  Holiday representative (CSW) received SNF consult. Per RN case manager [facility] did not accept patient. CSW attempted to meet with patient however he was not alert and oriented and no family was at the bedside. CSW attempted to contact patient's wife [name] however she did not answer and her voicemail was full. CSW left patient's daughter [name] a Advertising account executive. CSW contacted patient's daughter [name] and was able to reach her via telephone. Per [name] patient lives in [place] with his wife. CSW explained SNF process and [facility] denied patient. CSW explained that medicare requires a 3 night qualifying inpatient stay in the hospital in order to pay for SNF. Patient was admitted to inpatient 02/19/18. Patient goes to dialysis TTS at [facility] on [address]. CSW explained to daughter that patient will have to be sitting for dialysis prior to discharge. Daughter is agreeable to SNF  search in [place]. FL2 complete and faxed out.   CSW presented bed offers to patient's daughter [name]. Per [name] she will discuss offers with her mother and sister and call CSW back. CSW will continue to follow and assist as needed.    Employment status:  Disabled (Comment on whether or not currently receiving Disability), Retired Forensic scientist:  Medicare PT Recommendations:  [place] / Referral to community resources:  [place]  Patient/Family's Response to care:  Patient's daughter [name] will discuss offers with her family and call CSW back with their choice.   Patient/Family's Understanding of and Emotional Response to Diagnosis, Current Treatment, and Prognosis:  Patient's daughter was very pleasant and thanked CSW for assistance.   Emotional Assessment Appearance:  Appears stated age Attitude/Demeanor/Rapport:  Unable to Assess Affect (typically observed):  Unable to Assess Orientation:  Oriented to Self, Fluctuating Orientation (Suspected and/or reported Sundowners) Alcohol / Substance use:  Not Applicable Psych involvement (Current and /or in the community):  No (Comment)  Discharge Needs  Concerns to be addressed:  Discharge Planning Concerns Readmission within the last 30 days:  No Current discharge risk:  Dependent with Mobility, Cognitively Impaired, Chronically ill Barriers to Discharge:  Continued Medical Work up   [employer], Veronia Beets, LCSW 02/25/2018, 1:32 PM

## 2018-02-25 NOTE — Progress Notes (Signed)
Cardiac MD was notified of pt. Elevated HR 140's while turning and changing pt. early in the day. MD acknowledge.

## 2018-02-25 NOTE — Progress Notes (Signed)
[place], [place] 02/25/18  Subjective:  Patient seen at bedside. More awake and alert today. Completed dialysis yesterday.   Objective:  Vital signs in last 24 hours:  Temp:  [97.5 F (36.4 C)-98.9 F (37.2 C)] 97.5 F (36.4 C) (09/11 0435) Pulse Rate:  [56-103] 81 (09/11 0435) Resp:  [12-24] 16 (09/11 0435) BP: (139-193)/(66-107) 164/84 (09/11 0435) SpO2:  [97 %-100 %] 97 % (09/11 0435) Weight:  [71 kg-72 kg] 71 kg (09/10 1725)  Weight change: 0.3 kg Filed Weights   02/24/18 0450 02/24/18 1345 02/24/18 1725  Weight: 71.7 kg 72 kg 71 kg    Intake/Output:    Intake/Output Summary (Last 24 hours) at 02/25/2018 1015 Last data filed at 02/25/2018 0842 Gross per 24 hour  Intake 520.6 ml  Output 1000 ml  Net -479.4 ml     Physical Exam: General: NAD, laying in bed  HEENT Anicteric, moist mucus membranes  Neck supple  Pulm/lungs CTAB, normal effort  CVS/Heart S1S2 no rubs  Abdomen:  Soft, NTND BS present  Extremities: no edema  Neurologic: Able to follow a few basic commands  Skin: No rashes  Access: Left forearm AVG, some swelling around the graft       Basic Metabolic Panel:  Recent Labs  Lab 02/19/18 0700 02/20/18 0454 02/24/18 1427  NA 137 140 141  K 3.8 4.2 3.9  CL 93* 98 103  CO2 27 28 28   GLUCOSE 161* 126* 83  BUN 43* 22 25*  CREATININE 12.89* 8.04* 12.49*  CALCIUM 10.3 9.1 9.0  PHOS  --   --  2.3*     CBC: Recent Labs  Lab 02/19/18 0700 02/24/18 1427  WBC 10.6 7.2  NEUTROABS 4.3  --   HGB 13.0 9.9*  HCT 40.4 30.8*  MCV 75.4* 75.0*  PLT 195 173      Lab Results  Component Value Date   HEPBSAG Negative 09/05/2016      Microbiology:  Recent Results (from the past 240 hour(s))  Culture, blood (routine x 2)     Status: None   Collection Time: 02/19/18  9:17 AM  Result Value Ref Range Status   Specimen Description BLOOD RIGHT ARM  Final   Special Requests   Final    BOTTLES DRAWN AEROBIC AND  ANAEROBIC Blood Culture adequate volume   Culture   Final    NO GROWTH 5 DAYS Performed at [facility], [address] [place], [place] [address]    Report Status 02/24/2018 FINAL  Final  Culture, blood (routine x 2)     Status: Abnormal   Collection Time: 02/19/18  9:48 AM  Result Value Ref Range Status   Specimen Description   Final    BLOOD RIGHT FINGER Performed at [facility], [address] [place], [place] [address]    Special Requests   Final    BOTTLES DRAWN AEROBIC AND ANAEROBIC Blood Culture adequate volume Performed at [facility], [address] [place], [place] [address]    Culture  Setup Time   Final    IN BOTH AEROBIC AND ANAEROBIC BOTTLES GRAM POSITIVE COCCI IN CLUSTERS GRAM POSITIVE RODS CRITICAL RESULT CALLED TO, READ BACK BY AND VERIFIED WITH: C/MATT MCBANE @0220  02/20/18 [facility]    Culture (A)  Final    STAPHYLOCOCCUS SPECIES (COAGULASE NEGATIVE) THE SIGNIFICANCE OF ISOLATING THIS ORGANISM FROM A SINGLE SET OF BLOOD CULTURES WHEN MULTIPLE SETS ARE DRAWN IS UNCERTAIN. PLEASE NOTIFY THE MICROBIOLOGY DEPARTMENT WITHIN  ONE WEEK IF SPECIATION AND SENSITIVITIES ARE REQUIRED. BACILLUS SPECIES Standardized susceptibility testing for this organism is not available. Performed at [facility], [place] [address] [place], [place] [address]    Report Status 02/22/2018 FINAL  Final  Blood Culture ID Panel (Reflexed)     Status: Abnormal   Collection Time: 02/19/18  9:48 AM  Result Value Ref Range Status   Enterococcus species NOT DETECTED NOT DETECTED Final   Listeria monocytogenes NOT DETECTED NOT DETECTED Final   Staphylococcus species DETECTED (A) NOT DETECTED Final    Comment: Methicillin (oxacillin) susceptible coagulase negative staphylococcus. Possible blood culture contaminant (unless isolated from more than one blood culture draw or clinical case suggests pathogenicity). No antibiotic treatment is indicated for blood  culture  contaminants. CRITICAL RESULT CALLED TO, READ BACK BY AND VERIFIED WITH: C/MATT MCBANE @0220  02/20/18 [facility]    Staphylococcus aureus NOT DETECTED NOT DETECTED Final   Methicillin resistance NOT DETECTED NOT DETECTED Final   Streptococcus species NOT DETECTED NOT DETECTED Final   Streptococcus agalactiae NOT DETECTED NOT DETECTED Final   Streptococcus pneumoniae NOT DETECTED NOT DETECTED Final   Streptococcus pyogenes NOT DETECTED NOT DETECTED Final   Acinetobacter baumannii NOT DETECTED NOT DETECTED Final   Enterobacteriaceae species NOT DETECTED NOT DETECTED Final   Enterobacter cloacae complex NOT DETECTED NOT DETECTED Final   Escherichia coli NOT DETECTED NOT DETECTED Final   Klebsiella oxytoca NOT DETECTED NOT DETECTED Final   Klebsiella pneumoniae NOT DETECTED NOT DETECTED Final   Proteus species NOT DETECTED NOT DETECTED Final   Serratia marcescens NOT DETECTED NOT DETECTED Final   Haemophilus influenzae NOT DETECTED NOT DETECTED Final   Neisseria meningitidis NOT DETECTED NOT DETECTED Final   Pseudomonas aeruginosa NOT DETECTED NOT DETECTED Final   Candida albicans NOT DETECTED NOT DETECTED Final   Candida glabrata NOT DETECTED NOT DETECTED Final   Candida krusei NOT DETECTED NOT DETECTED Final   Candida parapsilosis NOT DETECTED NOT DETECTED Final   Candida tropicalis NOT DETECTED NOT DETECTED Final    Comment: Performed at [facility], [address] [place], [place] [address]  MRSA PCR Screening     Status: None   Collection Time: 02/19/18 12:36 PM  Result Value Ref Range Status   MRSA by PCR NEGATIVE NEGATIVE Final    Comment:        The GeneXpert MRSA Assay (FDA approved for NASAL specimens only), is one component of a comprehensive MRSA colonization surveillance program. It is not intended to diagnose MRSA infection nor to guide or monitor treatment for MRSA infections. Performed at [facility], [address] [place],  [address]    CULTURE, BLOOD (ROUTINE X 2) w Reflex to ID Panel     Status: None   Collection Time: 02/20/18 11:27 PM  Result Value Ref Range Status   Specimen Description BLOOD RIGHT HAND  Final   Special Requests   Final    BOTTLES DRAWN AEROBIC AND ANAEROBIC Blood Culture results may not be optimal due to an excessive volume of blood received in culture bottles   Culture   Final    NO GROWTH 5 DAYS Performed at [facility], [address] [place],  [address]    Report Status 02/25/2018 FINAL  Final  CULTURE, BLOOD (ROUTINE X 2) w Reflex to ID Panel     Status: None   Collection Time: 02/20/18 11:39 PM  Result Value Ref Range Status   Specimen Description BLOOD RIGHT ANTECUBITAL  Final   Special Requests   Final    BOTTLES DRAWN AEROBIC AND ANAEROBIC Blood Culture results may not be optimal due to an excessive volume of blood received in culture bottles   Culture   Final    NO GROWTH 5 DAYS Performed at [facility], [address] [place], [place] [address]    Report Status 02/25/2018 FINAL  Final    Coagulation Studies: No results for input(s): LABPROT, INR in the last 72 hours.  Urinalysis: No results for input(s): COLORURINE, LABSPEC, PHURINE, GLUCOSEU, HGBUR, BILIRUBINUR, KETONESUR, PROTEINUR, UROBILINOGEN, NITRITE, LEUKOCYTESUR in the last 72 hours.  Invalid input(s): APPERANCEUR    Imaging: No results found.   Medications:   . aspirin EC  81 mg Oral Daily  . atorvastatin  80 mg Oral q1800  . azithromycin  500 mg Oral Daily  . calcium acetate  1,334 mg Oral TID WC  . Chlorhexidine Gluconate Cloth  6 each Topical Q0600  . clopidogrel  75 mg Oral Q breakfast  . famotidine  20 mg Oral Q48H  . heparin  5,000 Units Subcutaneous Q8H  . hydrALAZINE  25 mg Oral Q8H  . insulin aspart  0-5 Units Subcutaneous QHS  . insulin aspart  0-9 Units Subcutaneous TID WC  . levETIRAcetam  250 mg Oral Once per day on Tue Thu Sat  . levETIRAcetam  750 mg Oral  Daily  . losartan  25 mg Oral Daily  . metoprolol succinate  12.5 mg Oral Q T,Th,Sat-1800  . metoprolol succinate  25 mg Oral Q M,W,F,Su-1800  . multivitamin  1 tablet Oral QHS  . sodium chloride flush  3 mL Intravenous Q12H      Assessment/ Plan:  66 y.o. 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   TTS [facility] [address]/70 kg/  1. ESRD -Patient had hemodialysis yesterday.  No acute indication for dialysis today.  We will plan for dialysis again tomorrow.  2. AOCKD -Continue to hold Epogen at this time.  3. SHPTH -Phosphorus was actually a bit low at 2.3 yesterday.  Poor p.o. intake noted.  Continue to monitor.  4. Altered mental status/right basal ganglia infarct - still quite weak.  He will likely need inpatient rehabilitation.  5.  Swelling around AV graft Continue to monitor clinically.    LOS: 6 Jaylee Freeze 9/11/201910:15 AM  [place], [place]  Note: This note was prepared with Dragon dictation. Any transcription errors are unintentional

## 2018-02-25 NOTE — Progress Notes (Signed)
Central Tele advised that Pt HR had brady down into the 30's and then went up to the 140's. They suggested that Pt  Seemed to be converting  to Afib. MD was notified and EKG ordered and completed and results called back to the on call MD. MD noted, no new orders given, continue to monitor.

## 2018-02-25 NOTE — Consult Note (Signed)
[facility] Consultation Note  Patient ID: [name], MRN: [id number], DOB/AGE: 24-Jul-1950 67 y.o. Admit date: 02/19/2018   Date of Consult: 02/25/2018 Primary Physician: [name], MD Primary Cardiologist: Raynelle Chary  Chief Complaint: No chief complaint on file.  Reason for Consult: Heart failure  HPI: 67 y.o. male with known coronary artery disease status post LIMA to the LAD in the remote past essential hypertension mixed hyperlipidemia diabetes with complication and chronic kidney disease stage V with dialysis.  The patient has had worsening symptoms of weakness and fatigue and unable to do the things he wishes in his house.  With this he has had some pulmonary edema and mild lower extremity edema.  With continued dialysis he is not improved at all.  He has known chronic systolic dysfunction heart failure which appears to be worsening ejection fraction over the last several months with ejection fraction of 40% now to 30%.  There is no evidence of valvular heart disease.  EKG has shown normal sinus rhythm with preventricular contractions and troponin level is 0.08 consistent with demand ischemia rather than acute coronary syndrome.  At this time the patient appears to be continuing to have difficulty with no evidence of infection or other primary source.  There may be concerns of high-output congestive heart failure due to his shunt.  May be further evaluated by vascular if necessary currently the patient does not converse well due to weakness  Past Medical History:  Diagnosis Date  . Chronic diastolic congestive heart failure ([place])   . Chronic disease anemia   . ESRD (end stage renal disease) on dialysis ([place])    "[place]; [place]; [name]" (09/29/2014)  . GERD (gastroesophageal reflux disease)   . Gout   . High cholesterol   . History of blood transfusion    "related to anemia"  . History of stomach ulcers   . Hypertension   . Type II diabetes mellitus ([place])        Surgical History:  Past Surgical History:  Procedure Laterality Date  . ARTERIOVENOUS GRAFT PLACEMENT Left ~ 1996  . CARDIAC CATHETERIZATION  09/29/2014   ""  . CARDIAC CATHETERIZATION N/A 02/26/2016   Procedure: Left Heart Cath and Coronary Angiography Possible PCI;  Surgeon: [name], MD;  Location: [facility];  Service: Cardiovascular;  Laterality: N/A;  . ENDOSCOPIC RETROGRADE CHOLANGIOPANCREATOGRAPHY (ERCP) WITH PROPOFOL N/A 09/17/2016   Procedure: ENDOSCOPIC RETROGRADE CHOLANGIOPANCREATOGRAPHY (ERCP) WITH PROPOFOL;  Surgeon: [name], MD;  Location: [facility] ENDOSCOPY;  Service: Endoscopy;  Laterality: N/A;  . ERCP N/A 09/10/2016   Procedure: ENDOSCOPIC RETROGRADE CHOLANGIOPANCREATOGRAPHY (ERCP);  Surgeon: [name], MD;  Location[facility] ENDOSCOPY;  Service: Endoscopy;  Laterality: N/A;  . KIDNEY TRANSPLANT Right 2004  . NEPHRECTOMY TRANSPLANTED ORGAN  2015  . PERCUTANEOUS CORONARY STENT INTERVENTION (PCI-S) N/A 09/30/2014   Procedure: PERCUTANEOUS CORONARY STENT INTERVENTION (PCI-S);  Surgeon: [name], MD;  Location: [facility] CATH LAB;  Service: Cardiovascular;  Laterality: N/A;  . PERITONEAL CATHETER INSERTION  02/2014  . PERITONEAL CATHETER REMOVAL  08/2014  . THROMBECTOMY / ARTERIOVENOUS GRAFT REVISION  2015     Home Meds: Prior to Admission medications   Medication Sig Start Date End Date Taking? Authorizing Provider  allopurinol (ZYLOPRIM) 100 MG tablet Take 100 mg by mouth daily. 09/23/14  Yes [provider]  aspirin 81 MG EC tablet Take 81 mg by mouth daily. 09/23/14  Yes [provider]  atorvastatin (LIPITOR) 40 MG tablet Take 2 tablets (80 mg total)  by mouth daily at 6 PM. 09/14/16  Yes Mody, Sital, MD  calcium acetate (PHOSLO) 667 MG capsule Take 1,334 mg by mouth 3 (three) times daily with meals.   Yes [provider]  levETIRAcetam (KEPPRA) 1000 MG tablet Take 1 tablet (1,000 mg total) by mouth daily. Patient taking  differently: Take 750 mg by mouth daily.  07/29/16  Yes Henreitta Leber, MD  levETIRAcetam (KEPPRA) 250 MG tablet Take on the Days on Dialysis (Tues, Thurs, Sat) after Dialysis. Patient taking differently: Take 250 mg by mouth daily. Take 250 mg on Dialysis days (Tues, Thurs, Sat) after Dialysis. 07/29/16  Yes Henreitta Leber, MD  metoprolol succinate (TOPROL-XL) 25 MG 24 hr tablet Take 12.5-25 mg by mouth daily. Take 25 mg by mouth on non-dialysis days and 12.5 mg on dialysis days (Tuesday, Thursday and Saturday) after dialysis.   Yes [provider]  mirtazapine (REMERON) 15 MG tablet Take 15 mg by mouth at bedtime.    Yes [provider]  acetaminophen (TYLENOL) 325 MG tablet Take 2 tablets (650 mg total) by mouth every 6 (six) hours as needed for mild pain (or Fever >/= 101). 02/27/16   [name], MD  clopidogrel (PLAVIX) 75 MG tablet Take 1 tablet (75 mg total) by mouth daily with breakfast. 10/02/14   [name], MD  heparin 100-0.45 UNIT/ML-% infusion Inject 1,000 Units/hr into the vein continuous. Patient not taking: Reported on 02/19/2018 02/27/16   [name], MD  losartan (COZAAR) 25 MG tablet Take 1 tablet (25 mg total) by mouth daily. Patient not taking: Reported on 10/23/2016 09/18/16   Demetrios Loll, MD  multivitamin (RENA-VIT) TABS tablet Take 1 tablet by mouth at bedtime. Patient not taking: Reported on 09/26/2016 10/02/14   [name], MD  nitroGLYCERIN (NITROSTAT) 0.4 MG SL tablet Place 1 tablet (0.4 mg total) under the tongue every 5 (five) minutes x 3 doses as needed for chest pain. Patient not taking: Reported on 09/26/2016 10/02/14   [name], MD    Inpatient Medications:  . aspirin EC  81 mg Oral Daily  . atorvastatin  80 mg Oral q1800  . calcium acetate  1,334 mg Oral TID WC  . Chlorhexidine Gluconate Cloth  6 each Topical Q0600  . clopidogrel  75 mg Oral Q breakfast  . famotidine  20 mg Oral Q48H  . heparin  5,000 Units Subcutaneous  Q8H  . hydrALAZINE  25 mg Oral Q8H  . insulin aspart  0-5 Units Subcutaneous QHS  . insulin aspart  0-9 Units Subcutaneous TID WC  . levETIRAcetam  250 mg Oral Once per day on Tue Thu Sat  . levETIRAcetam  750 mg Oral Daily  . losartan  25 mg Oral Daily  . metoprolol succinate  12.5 mg Oral Q T,Th,Sat-1800  . metoprolol succinate  25 mg Oral Q M,W,F,Su-1800  . multivitamin  1 tablet Oral QHS  . sodium chloride flush  3 mL Intravenous Q12H   . sodium chloride 10 mL/hr at 02/24/18 1821    Allergies:  Allergies  Allergen Reactions  . Ivp Dye [Iodinated Diagnostic Agents] Other (See Comments)    Pt denied    Social History   Socioeconomic History  . Marital status: Married    Spouse name: Not on file  . Number of children: Not on file  . Years of education: Not on file  . Highest education level: Not on file  Occupational History  . Not on file  Social Needs  .  Financial resource strain: Not on file  . Food insecurity:    Worry: Not on file    Inability: Not on file  . Transportation needs:    Medical: Not on file    Non-medical: Not on file  Tobacco Use  . Smoking status: Former Smoker    Types: Cigarettes  . Smokeless tobacco: Never Used  . Tobacco comment: "quit smoking cigarettes in the 1980's"  Substance and Sexual Activity  . Alcohol use: No  . Drug use: No  . Sexual activity: Not on file  Lifestyle  . Physical activity:    Days per week: Not on file    Minutes per session: Not on file  . Stress: Not on file  Relationships  . Social connections:    Talks on phone: Not on file    Gets together: Not on file    Attends religious service: Not on file    Active member of club or organization: Not on file    Attends meetings of clubs or organizations: Not on file    Relationship status: Not on file  . Intimate partner violence:    Fear of current or ex partner: Not on file    Emotionally abused: Not on file    Physically abused: Not on file    Forced  sexual activity: Not on file  Other Topics Concern  . Not on file  Social History Narrative  . Not on file     Family History  Problem Relation Age of Onset  . Hypertension Mother   . Stroke Mother   . Hypertension Father   . Diabetes Mellitus II Father   . Asthma Father      Review of Systems Not assessed due to.  Labs: No results for input(s): CKTOTAL, CKMB, TROPONINI in the last 72 hours. Lab Results  Component Value Date   WBC 7.2 02/24/2018   HGB 9.9 (L) 02/24/2018   HCT 30.8 (L) 02/24/2018   MCV 75.0 (L) 02/24/2018   PLT 173 02/24/2018    Recent Labs  Lab 02/19/18 0700  02/24/18 1427  NA 137   < > 141  K 3.8   < > 3.9  CL 93*   < > 103  CO2 27   < > 28  BUN 43*   < > 25*  CREATININE 12.89*   < > 12.49*  CALCIUM 10.3   < > 9.0  PROT 8.5*  --   --   BILITOT 1.3*  --   --   ALKPHOS 82  --   --   ALT 14  --   --   AST 23  --   --   GLUCOSE 161*   < > 83   < > = values in this interval not displayed.   Lab Results  Component Value Date   CHOL 69 02/20/2018   HDL 31 (L) 02/20/2018   LDLCALC 20 02/20/2018   TRIG 88 02/20/2018   No results found for: DDIMER  Radiology/Studies:  Ct Head Wo Contrast  Result Date: 02/19/2018 CLINICAL DATA:  Altered level of consciousness EXAM: CT HEAD WITHOUT CONTRAST TECHNIQUE: Contiguous axial images were obtained from the base of the skull through the vertex without intravenous contrast. COMPARISON:  Head CT July 26, 2016; brain MRI July 26, 2016 FINDINGS: Brain: There is moderate diffuse atrophy. There is no intracranial mass, hemorrhage, extra-axial fluid collection, or midline shift. There is patchy small vessel disease in the centra semiovale bilaterally. There is  a prior small infarct in the inferior left cerebellum. There is a prior small infarct in the right pons. Elsewhere gray-white compartments appear normal. No acute infarct evident. Vascular: No hyperdense vessels are evident. There is calcification in each  cavernous carotid artery region as well as in the distal left vertebral artery. Skull: The bony calvarium appears intact. Sinuses/Orbits: There is mucosal thickening in several ethmoid air cells. Other visualized paranasal sinuses are clear. Visualized orbits appear symmetric bilaterally. Other: Mastoid air cells are clear. IMPRESSION: Atrophy with patchy supratentorial small vessel disease. 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 No acute infarct evident. No mass or hemorrhage. There are foci of arterial vascular calcification. There is mucosal thickening in several ethmoid air cells. Electronically Signed   By: Lowella Grip III M.D.   On: 02/19/2018 07:59   Mr Brain Wo Contrast  Result Date: 02/19/2018 CLINICAL DATA:  Altered mental status for 2 weeks. History of end-stage renal disease on dialysis, hypertension, hypercholesterolemia and diabetes. EXAM: MRI HEAD WITHOUT CONTRAST TECHNIQUE: Multiplanar, multiecho pulse sequences of the brain and surrounding structures were obtained without intravenous contrast. COMPARISON:  CT HEAD February 19, 2018 and MRI of the head February 13, 2018 FINDINGS: Moderately motion degraded examination. INTRACRANIAL CONTENTS: 6 mm reduced diffusion RIGHT basal ganglia with low ADC values. Minimal T2 shine through LEFT fourth frontal periventricular white matter. Scattered chronic microhemorrhages better seen on today's SWI sequence. Cerebellar dentate nuclei and basal ganglia mineralization. Moderate to severe parenchymal brain volume loss. Patchy to confluent supratentorial white matter FLAIR T2 hyperintensities. Small area RIGHT occipital lobe and LEFT frontal encephalomalacia. Old small LEFT cerebellar infarcts. Old small pontine lacunar infarcts. Old bilateral basal ganglia lacunar infarcts. VASCULAR: Normal major intracranial vascular flow voids present at skull base. SKULL AND UPPER CERVICAL SPINE: No abnormal sellar expansion. No suspicious calvarial  bone marrow signal. Craniocervical junction maintained. SINUSES/ORBITS: The mastoid air-cells and included paranasal sinuses are well-aerated.The included ocular globes and orbital contents are non-suspicious. OTHER: None. IMPRESSION: 1. Moderately motion degraded examination. Acute RIGHT basal ganglia lacunar infarct. 2. Old small LEFT frontal RIGHT occipital lobe infarcts. Multiple old small supra and infratentorial infarcts. 3. Moderate chronic small vessel ischemic changes. 4. Moderate to severe parenchymal brain volume loss. Electronically Signed   By: Elon Alas M.D.   On: 02/19/2018 15:05   Mr Brain Wo Contrast  Result Date: 02/13/2018 CLINICAL DATA:  Initial evaluation for intermittent weakness and confusion for 2 weeks. EXAM: MRI HEAD WITHOUT CONTRAST TECHNIQUE: Multiplanar, multiecho pulse sequences of the brain and surrounding structures were obtained without intravenous contrast. COMPARISON:  Prior MRI from 07/26/2016. FINDINGS: Brain: Moderately advanced cerebral atrophy. Patchy and confluent T2/FLAIR hyperintensity within the periventricular and deep white matter both cerebral hemispheres most consistent with chronic small vessel ischemic disease, moderate nature. Small remote right occipital cortical infarct (series 6, image 17). Few small remote left cerebellar infarcts. No abnormal foci of restricted diffusion to suggest acute or subacute ischemia. Gray-white matter differentiation maintained. No other areas of remote cortical infarction. No acute intracranial hemorrhage. Few tiny chronic micro hemorrhages noted. No mass lesion, midline shift or mass effect. Mild ventricular prominence related global parenchymal volume loss of hydrocephalus. No extra-axial fluid collection. Normal pituitary gland. Vascular: Major intracranial vascular flow voids grossly maintained at the skull base. Skull and upper cervical spine: Craniocervical junction normal. Cervical spondylolysis noted at C5-6  without significant stenosis. No focal marrow replacing lesion. Scalp soft tissues unremarkable. Sinuses/Orbits: Globes orbital soft tissues demonstrate  no acute finding paranasal sinuses are clear. No mastoid effusion. Inner ear structures normal. Other: None. IMPRESSION: 1. No acute intracranial abnormality. 2. Small remote right occipital and left cerebellar infarcts. 3. Moderately advanced cerebral atrophy with chronic small vessel ischemic disease. Electronically Signed   By: Jeannine Boga M.D.   On: 02/13/2018 21:20   US Carotid Bilateral  Result Date: 02/20/2018 CLINICAL DATA:  Cerebrovascular accident. History of hypertension, hyperlipidemia and diabetes. Former smoker. History of end-stage renal disease, currently on dialysis. EXAM: BILATERAL CAROTID DUPLEX ULTRASOUND TECHNIQUE: Pearline Cables scale imaging, color Doppler and duplex ultrasound were performed of bilateral carotid and vertebral arteries in the neck. COMPARISON:  11/06/2014 FINDINGS: Criteria: Quantification of carotid stenosis is based on velocity parameters that correlate the residual internal carotid diameter with NASCET-based stenosis levels, using the diameter of the distal internal carotid lumen as the denominator for stenosis measurement. The following velocity measurements were obtained: RIGHT ICA:  107/23 cm/sec CCA:  37/62 cm/sec SYSTOLIC ICA/CCA RATIO:  1.3 ECA:  104 cm/sec LEFT ICA:  85/19 cm/sec CCA:  83/15 cm/sec SYSTOLIC ICA/CCA RATIO:  1.0 ECA:  87 cm/sec RIGHT CAROTID ARTERY: There is a minimal amount of echogenic plaque within the right carotid bulb (images 16 and 23), morphologically similar to the 10/2014 examination and again not resulting in elevated peak systolic velocities within the interrogated course of the right internal carotid artery to suggest a hemodynamically significant stenosis. RIGHT VERTEBRAL ARTERY:  Antegrade flow LEFT CAROTID ARTERY: There is a minimal amount of echogenic plaque within the left carotid  bulb (image 47 and 49), extending to involve the origin and proximal aspect the left internal carotid artery (image 55), morphologically similar to the 10/2014 examination and again not resulting in elevated peak systolic velocities within the interrogated course the left internal carotid artery to suggest a hemodynamically significant stenosis. LEFT VERTEBRAL ARTERY:  Antegrade Flow IMPRESSION: Minimal amount of bilateral atherosclerotic plaque, left subjectively greater than right, morphologically similar to the 10/2014 examination, and again not resulting in a hemodynamically significant stenosis within either internal carotid artery. Electronically Signed   By: Sandi Mariscal M.D.   On: 02/20/2018 10:37   [place] Dialysis Access  Result Date: 02/20/2018 CLINICAL DATA:  Left upper extremity dialysis access assess for patency EXAM: ULTRASOUND DIALYSIS ACCESS TECHNIQUE: Ultrasound Doppler interrogation of the left arm access site performed with grayscale, color and Doppler evaluation. COMPARISON:  None. FINDINGS: Inflow artery: 2.5 mm, 62 centimeters/second Arterial anastomosis 5.8 mm, 106 centimeters/seconds Graft: Proximal 6.7 mm, 145 centimeters/second Mid 5.8 mm, 82 centimeters/second Distal 5.8 mm, 106 centimeters/second Left upper arm AV access appears patent. Stented segments throughout the access appear patent. No significant velocity abnormality to suggest significant access stenosis. Negative for occlusion or significant thrombus. Irregular hypoechoic perigraft complex fluid about the mid forearm level measures 1.8 x 1.1 cm more compatible with perigraft hematoma rather than abscess. This correlates with an area repeated puncture site. No significant velocity abnormality. AV anastomosis demonstrates significant vascular disease of the inflow radial artery. IMPRESSION: Patent left upper extremity dialysis access conduit containing stented segments which are also patent. Negative for occlusion or significant  stenosis. 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 Electronically Signed   By: Jerilynn Mages.  Shick M.D.   On: 02/20/2018 15:56   Dg Chest Port 1 View  Result Date: 02/19/2018 CLINICAL DATA:  Unresponsive.  Dialysis patient. EXAM: PORTABLE  CHEST 1 VIEW COMPARISON:  07/12/2017 FINDINGS: Mild left lower lobe airspace disease unchanged. Right lung clear. Negative for edema or effusion. Prior median sternotomy. Heart size upper normal. Vascular stent in the left arm. IMPRESSION: Mild left lower lobe atelectasis/infiltrate, similar to the prior study. Electronically Signed   By: Franchot Gallo M.D.   On: 02/19/2018 07:37    EKG: Sinus rhythm with preventricular contractions  Weights: Filed Weights   02/24/18 0450 02/24/18 1345 02/24/18 1725  Weight: 71.7 kg 72 kg 71 kg     Physical Exam: Blood pressure (!) 161/77, pulse 88, temperature 97.8 F (36.6 C), temperature source Oral, resp. rate 17, height 5\' 5"  (1.651 m), weight 71 kg, SpO2 98 %. Body mass index is 26.05 kg/m. General: Well developed, well nourished, in no acute distress. Head eyes ears nose throat: Normocephalic, atraumatic, sclera non-icteric, no xanthomas, nares are without discharge. No apparent thyromegaly and/or mass  Lungs: Normal respiratory effort.  no wheezes, no rales, no rhonchi.  Heart: RRR with normal S1 S2. no murmur gallop, no rub, there is vascular home at the left upper chest into the left arm PMI is normal size and placement, carotid upstroke normal without bruit, jugular venous pressure is normal Abdomen: Soft, non-tender, non-distended with normoactive bowel sounds. No hepatomegaly. No rebound/guarding. No obvious abdominal masses. Abdominal aorta is normal size without bruit Extremities: Trace to 1+ edema. no cyanosis, no clubbing, no ulcers   Peripheral : 2+ bilateral upper extremity pulses, 2+ bilateral femoral pulses, 2+ bilateral dorsal pedal pulse left arm shunt Neuro: Is not alert and oriented. No facial asymmetry. No focal deficit. Moves all extremities spontaneously. Musculoskeletal: Weak muscle tone without kyphosis Psych: Does not responds to questions appropriately with a normal affect.    Assessment: 67 year old male with known coronary disease status post coronary bypass graft essential hypertension mixed hyperlipidemia diabetes chronic kidney disease with acute on chronic systolic dysfunction heart failure possibly be due to high output failure due to shunt versus idiopathic cause  Plan: 1.  Continue dialysis as able for volume overload lower extremity edema and pulmonary edema 2.  Hypertension control with current medical regimen watching closely for any particular side effects at this time 3.  High intensity cholesterol therapy for coronary artery disease 4.  Further potential investigation of neurologic or light abnormalities contributing to above 5.  Further possible investigation of high output heart failure  Signed, Corey Skains M.D. [facility] 02/25/2018, 6:03 PM

## 2018-02-25 NOTE — Progress Notes (Signed)
Patient's daughter [name] called [facility] representative (CSW) back. CSW presented bed offers. She chose [facility]. [facility] liaison is aware of aware of above.  [employer], LCSW [phone]

## 2018-02-25 NOTE — Progress Notes (Signed)
Physical Therapy Treatment Patient Details Name: [name] MRN: [id number] DOB: 1951-04-24 Today's Date: 02/25/2018    History of Present Illness Pt. is a 67 y.o. male who presented to the ER from hemodialysis with altered mental status. Pt. was admitted for encephalopathy, acute probable left community-acquired pneumonia, and end-stage renal disease due for hemodialysis today    PT Comments    Pt lethargic; responds a little initially to questioning with lethargy and flat affect. Only able to demonstrate AAROM on R with DF/PF and trace on L. All other attempts at range or isometric strength result in PROM; pt does exhibit some resistance of movement on the RLE. Pt remains lethargic and no longer responds to voice or attempted treatment. No further attempts. Discharge planning changed due to low tolerance/participation.    Follow Up Recommendations  SNF(possibly LTC if unable to demonstrate progress)     Equipment Recommendations       Recommendations for Other Services       Precautions / Restrictions Precautions Precautions: Fall Restrictions Weight Bearing Restrictions: No    Mobility  Bed Mobility               General bed mobility comments: Not tested  Transfers                    Ambulation/Gait                 Stairs             Wheelchair Mobility    Modified Rankin (Stroke Patients Only)       Balance                                            Cognition Arousal/Alertness: Lethargic   Overall Cognitive Status: Impaired/Different from baseline Area of Impairment: Orientation;Attention;Following commands;Awareness                 Orientation Level: Disoriented to;Place;Time;Situation Current Attention Level: (Little to no attention)   Following Commands: (Does not follow most commands)   Awareness: (limited awareness)   General Comments: Very limited interaction      Exercises General  Exercises - Lower Extremity Ankle Circles/Pumps: AAROM;Both;15 reps;Supine Quad Sets: Other (comment)(attempted; does not follow/comprehend) Short Arc Quad: PROM;Both;5 reps;Supine Heel Slides: PROM;Both;5 reps;Supine(tends to mildly resist movement on R) Hip ABduction/ADduction: PROM;Both;5 reps;Supine    General Comments        Pertinent Vitals/Pain Pain Assessment: No/denies pain    Home Living                      Prior Function            PT Goals (current goals can now be found in the care plan section) Progress towards PT goals: Not progressing toward goals - comment    Frequency    7X/week      PT Plan Discharge plan needs to be updated    Co-evaluation              AM-PAC PT "6 Clicks" Daily Activity  Outcome Measure  Difficulty turning over in bed (including adjusting bedclothes, sheets and blankets)?: Unable Difficulty moving from lying on back to sitting on the side of the bed? : Unable Difficulty sitting down on and standing up from a chair with arms (e.g., wheelchair, bedside commode, etc,.)?: Unable Help  needed moving to and from a bed to chair (including a wheelchair)?: Total Help needed walking in hospital room?: Total Help needed climbing 3-5 steps with a railing? : Total 6 Click Score: 6    End of Session   Activity Tolerance: Patient limited by lethargy;Other (comment)(cognition) Patient left: in bed;with call bell/phone within reach;with bed alarm set;with family/visitor present   PT Visit Diagnosis: Muscle weakness (generalized) (M62.81);Difficulty in walking, not elsewhere classified (R26.2);Other abnormalities of gait and mobility (R26.89);Hemiplegia and hemiparesis Hemiplegia - Right/Left: Left Hemiplegia - dominant/non-dominant: Dominant Hemiplegia - caused by: Cerebral infarction     Time: 1518-3437 PT Time Calculation (min) (ACUTE ONLY): 12 min  Charges:  $Therapeutic Exercise: 8-22 mins                       Larae Grooms, PTA 02/25/2018, 12:36 PM

## 2018-02-25 NOTE — Progress Notes (Signed)
[facility] NAME: [name]    MR#:  [id number]  DATE OF BIRTH:  04-Aug-1950  SUBJECTIVE:  CHIEF COMPLAINT:  No chief complaint on file. Patient is demented or confused.  Noncommunicative.  He had a one episode of bradycardia and tachycardia last night. REVIEW OF SYSTEMS:    Review of Systems  Unable to perform ROS: Mental status change    DRUG ALLERGIES:   Allergies  Allergen Reactions  . Ivp Dye [Iodinated Diagnostic Agents] Other (See Comments)    Pt denied    VITALS:  Blood pressure (!) 161/77, pulse 88, temperature 97.8 F (36.6 C), temperature source Oral, resp. rate 17, height 5\' 5"  (1.651 m), weight 71 kg, SpO2 98 %.  PHYSICAL EXAMINATION:  GENERAL:  67 y.o.-year-old patient lying in the bed with no acute distress.  EYES: Pupils equal, round, reactive to light and accommodation. No scleral icterus. Extraocular muscles intact.  HEENT: Head atraumatic, normocephalic.  NECK:  Supple, no jugular venous distention. No thyroid enlargement, no tenderness.  LUNGS: Normal breath sounds bilaterally, no wheezing, rales,rhonchi or crepitation. No use of accessory muscles of respiration.  CARDIOVASCULAR: S1, S2 normal. No murmurs, rubs, or gallops.  ABDOMEN: Soft, nontender, nondistended. Bowel sounds present. No organomegaly or mass.  EXTREMITIES: No pedal edema, cyanosis, or clubbing.  NEUROLOGIC: Unable to exam. PSYCHIATRIC: The patient is confused and noncommunicative. SKIN: No obvious rash, lesion, or ulcer.   Physical Exam LABORATORY PANEL:   CBC Recent Labs  Lab 02/24/18 1427  WBC 7.2  HGB 9.9*  HCT 30.8*  PLT 173   ------------------------------------------------------------------------------------------------------------------  Chemistries  Recent Labs  Lab 02/19/18 0700  02/24/18 1427  NA 137   < > 141  K 3.8   < > 3.9  CL 93*   < > 103  CO2 27   < > 28  GLUCOSE 161*   < > 83  BUN 43*   < > 25*   CREATININE 12.89*   < > 12.49*  CALCIUM 10.3   < > 9.0  AST 23  --   --   ALT 14  --   --   ALKPHOS 82  --   --   BILITOT 1.3*  --   --    < > = values in this interval not displayed.   ------------------------------------------------------------------------------------------------------------------  Cardiac Enzymes Recent Labs  Lab 02/19/18 2220 02/20/18 0454  TROPONINI 0.07* 0.09*   ------------------------------------------------------------------------------------------------------------------  RADIOLOGY:  No results found.  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  Echocardiogram: LV EF: 30% -   35%, carotid Dopplers is unremarkable.  Aspiration/fall/skin care precautions while in house, and continue close medical monitoring. Continue aspirin 81 mg/day and Plavix 75 mg per Dr. Doy Mince. Possible SNF placement.  *Acute gram-positive cocci blood infection He was on IV vancomycin, repeated blood cultures are negative so far.  Discontinued vancomycin if repeated blood culture is negative per ID Dr. Steva Ready.  *Acute probable left community-acquired pneumonia Continue empiric Rocephin/azithromycin, pneumonia protocol.  *Chronic ESRD on HD TTS Nephrology following for hemodialysis needs  *History of peptic ulcer disease PPI daily  *Acute accelerated hypertension Continue Lopressor, and hydralazine p.o. and IV as needed. Controlled.  *History of seizure disorder Continue Keppra, seizure precautions  Hypoglycemia.  D50  IV and monitor blood glucose.  Improved.  Diabetes 2.  Changed to sensitive sliding scale.  Acute metabolic encephalopathy and possible underlying dementia. Per his wife, the  patient has confusion at baseline, but worse this time. Aspiration fall precaution.  Tachycardia and bradycardia.  Cardiology consult.  Very poor prognosis.  Palliative care staff is following.  Family is not ready for hospice. All the records are reviewed and case discussed with Care Management/Social Workerr. Management plans discussed with the patient, his wife and daughter and they are in agreement.  CODE STATUS: dnr  TOTAL TIME TAKING CARE OF THIS PATIENT: 32 minutes.  POSSIBLE D/C IN 1-2 DAYS, DEPENDING ON CLINICAL CONDITION.   Demetrios Loll M.D on 02/25/2018   Between 7am to 6pm - Pager - [phone]  After 6pm go to [email or web address] - password [email or web address] [facility]  Office  [phone]  CC: Primary care physician; [name], MD  Note: This dictation was prepared with Dragon dictation along with smaller phrase technology. Any transcriptional errors that result from this process are unintentional.

## 2018-02-26 LAB — PHOSPHORUS: Phosphorus: 2.8 mg/dL (ref 2.5–4.6)

## 2018-02-26 LAB — GLUCOSE, CAPILLARY
GLUCOSE-CAPILLARY: 126 mg/dL — AB (ref 70–99)
GLUCOSE-CAPILLARY: 138 mg/dL — AB (ref 70–99)
GLUCOSE-CAPILLARY: 161 mg/dL — AB (ref 70–99)
Glucose-Capillary: 108 mg/dL — ABNORMAL HIGH (ref 70–99)

## 2018-02-26 MED ORDER — HYDRALAZINE HCL 25 MG PO TABS: 25.0000 mg | ORAL_TABLET | Freq: Three times a day (TID) | ORAL | Status: DC

## 2018-02-26 MED ORDER — NEPRO/CARBSTEADY PO LIQD: 237.0000 mL | Freq: Two times a day (BID) | ORAL | Status: DC

## 2018-02-26 MED ORDER — VITAMIN C 500 MG PO TABS
250.0000 mg | ORAL_TABLET | Freq: Two times a day (BID) | ORAL | Status: DC
  Administered 2018-02-26 – 2018-02-27 (×2): 250 mg via ORAL
  Filled 2018-02-26 (×4): qty 0.5

## 2018-02-26 MED ORDER — LEVETIRACETAM 1000 MG PO TABS: 750.0000 mg | ORAL_TABLET | Freq: Every day | ORAL | Status: DC

## 2018-02-26 MED ORDER — NITROGLYCERIN 0.4 MG SL SUBL: 0.4000 mg | SUBLINGUAL_TABLET | SUBLINGUAL | 12 refills | Status: AC | PRN

## 2018-02-26 NOTE — Progress Notes (Signed)
Pre HD Treatment    02/26/18 1352  Vital Signs  Temp 98.1 F (36.7 C)  Temp Source Axillary  Pulse Rate Source Monitor  Resp 18  BP (!) 197/94  BP Location Right Arm  BP Method Automatic  Patient Position (if appropriate) Lying  Oxygen Therapy  SpO2 100 %  O2 Device Room Air  Pain Assessment  Pain Scale 0-10  Pain Score 0  Dialysis Weight  Weight  (unable to obtain, pt unsteady)  Time-Out for Hemodialysis  What Procedure? HD  Pt Identifiers(min of two) First/Last Name;MRN/Account#;Pt's DOB(use if MRN/Acct# not available  Correct Site? Yes  Correct Side? Yes  Correct Procedure? Yes  Consents Verified? Yes  Rad Studies Available? N/A  Safety Precautions Reviewed? Yes  [employer] Number [id number]  Station Number 3  UF/Alarm Test Passed  Conductivity: Meter 14  Conductivity: Machine  14.2  pH 7  Reverse Osmosis Main  Machine Temperature 97.7 F (36.5 C)  Musician and Audible Yes  Blood Lines Intact and Secured Yes  Pre Treatment Patient Checks  Vascular access used during treatment Graft  Hepatitis B Surface Antigen Results Negative  Date Hepatitis B Surface Antigen Drawn 01/27/18  Hepatitis B Surface Antibody 66  Date Hepatitis B Surface Antibody Drawn 04/09/17  Hemodialysis Consent Verified Yes  Hemodialysis Standing Orders Initiated Yes  ECG (Telemetry) Monitor On Yes  Prime Ordered Normal Saline  Length of  DialysisTreatment -hour(s) 3.5 Hour(s) (3.5 Hours)  Dialyzer Elisio 17H NR  Dialysate 2K, 2.5 Ca  Dialysis Anticoagulant None  Dialysate Flow Ordered 800  Blood Flow Rate Ordered 400 mL/min  Ultrafiltration Goal 1 Liters (1 Liter)  Pre Treatment Labs Phosphorus  Dialysis Blood Pressure Support Ordered Normal Saline  Education / Care Plan  Dialysis Education Provided Yes  Documented Education in Care Plan Yes  Fistula / Graft Left Forearm Arteriovenous vein graft  No Placement Date or Time found.   Placed prior to admission:  Yes  Orientation: Left  Access Location: Forearm  Access Type: Arteriovenous vein graft  Site Condition Red/inflamed;Other (Comment) (Bruising noted)  Fistula / Graft Assessment Present;Thrill;Bruit  Drainage Description None

## 2018-02-26 NOTE — Progress Notes (Signed)
Physical Therapy Treatment Patient Details Name: [name] MRN: [id number] DOB: Sep 18, 1950 Today's Date: 02/26/2018    History of Present Illness Pt. is a 67 y.o. male who presented to the ER from hemodialysis with altered mental status. Pt. was admitted for encephalopathy, acute probable left community-acquired pneumonia, and end-stage renal disease due for hemodialysis today    PT Comments    Pt sleeping upon arrival, leaning heavily to left side.  Repositioned with max a x 1.  Pt awoke briefly and stated he was "OK" but fell quickly back to sleep.  Participated in exercises as described below in attempts to keep pt engaged but unsuccessful and he was unable to participate much at all during session. Mobility deferred.  HR remained in 70's during limited activity.   Follow Up Recommendations  SNF     Equipment Recommendations  None recommended by PT    Recommendations for Other Services       Precautions / Restrictions Precautions Precautions: Fall Restrictions Weight Bearing Restrictions: No    Mobility  Bed Mobility               General bed mobility comments: deferred due to lethargy  Transfers                 General transfer comment: deferred  Ambulation/Gait                 Stairs             Wheelchair Mobility    Modified Rankin (Stroke Patients Only)       Balance                                            Cognition Arousal/Alertness: Lethargic Behavior During Therapy: Flat affect Overall Cognitive Status: Impaired/Different from baseline                                 General Comments: Very limited interaction      Exercises Other Exercises Other Exercises: BLE AAROM for ankle pumps. heels slides, ab/add, SLR x 10    General Comments        Pertinent Vitals/Pain Pain Assessment: No/denies pain    Home Living                      Prior Function            PT  Goals (current goals can now be found in the care plan section) Progress towards PT goals: Not progressing toward goals - comment    Frequency    7X/week      PT Plan Current plan remains appropriate    Co-evaluation              AM-PAC PT "6 Clicks" Daily Activity  Outcome Measure  Difficulty turning over in bed (including adjusting bedclothes, sheets and blankets)?: Unable Difficulty moving from lying on back to sitting on the side of the bed? : Unable Difficulty sitting down on and standing up from a chair with arms (e.g., wheelchair, bedside commode, etc,.)?: Unable Help needed moving to and from a bed to chair (including a wheelchair)?: Total Help needed walking in hospital room?: Total Help needed climbing 3-5 steps with a railing? : Total 6 Click Score: 6    End  of Session   Activity Tolerance: Patient limited by lethargy;Other (comment) Patient left: in bed;with bed alarm set;with call bell/phone within reach   Hemiplegia - Right/Left: Left Hemiplegia - dominant/non-dominant: Dominant Hemiplegia - caused by: Cerebral infarction     Time: 6922-3009 PT Time Calculation (min) (ACUTE ONLY): 8 min  Charges:  $Therapeutic Exercise: 8-22 mins                     Chesley Noon, PTA 02/26/18, 10:07 AM

## 2018-02-26 NOTE — Discharge Instructions (Signed)
Fall and aspiration precaution.

## 2018-02-26 NOTE — Progress Notes (Signed)
Post HD Treatment  Pt tolerated treatment well overall. Towards the end of treatment patient started experiencing hypotension episodes. MD notified and aware. This led to patient's BFR being decreased and his UF being turned off. Patient's net UF was 981. His BVP was 72.1 and the Kt was 46.7. All of his blood was returned. Patient alert and stable. Report called and given to Verdis Prime, RN.    02/26/18 1825  Hand-Off documentation  Report given to (Full Name) Verdis Prime, RN  Report received from (Full Name) Stephannie Peters, RN  Vital Signs  Temp 98.1 F (36.7 C)  Temp Source Axillary  Pulse Rate 94  Pulse Rate Source Monitor  Resp 17  BP 130/73  BP Location Right Arm  BP Method Automatic  Patient Position (if appropriate) Sitting  Oxygen Therapy  SpO2 100 %  O2 Device Room Air  Pain Assessment  Pain Scale 0-10  Pain Score 0  Dialysis Weight  Weight  (unable to obtain weight)  Type of Weight Post-Dialysis  Post-Hemodialysis Assessment  Rinseback Volume (mL) 250 mL  KECN 72.1 V  Dialyzer Clearance Lightly streaked  Duration of HD Treatment -hour(s) 3.5 hour(s)  Hemodialysis Intake (mL) 750 mL  UF Total -Machine (mL) 1731 mL  Net UF (mL) 981 mL  Tolerated HD Treatment No (Comment)  AVG/AVF Arterial Site Held (minutes) 10 minutes  AVG/AVF Venous Site Held (minutes) 10 minutes  Fistula / Graft Left Forearm Arteriovenous vein graft  No Placement Date or Time found.   Placed prior to admission: Yes  Orientation: Left  Access Location: Forearm  Access Type: Arteriovenous vein graft  Site Condition No complications  Fistula / Graft Assessment Present;Thrill;Bruit  Drainage Description None

## 2018-02-26 NOTE — Progress Notes (Signed)
Occupational Therapy Treatment Patient Details Name: [name] MRN: [id number] DOB: Oct 14, 1950 Today's Date: 02/26/2018    History of present illness Pt. is a 67 y.o. male who presented to the ER from hemodialysis with altered mental status. Pt. was admitted for encephalopathy, acute probable left community-acquired pneumonia, and end-stage renal disease due for hemodialysis today   OT comments  Pt. continues to be lethargic. Pt. Was able to open his eyes intermittently in response to verbal stimuli.  Pt. Tolerated UE gentle PROM with limited UE range. Pt. Was assisted with repositioning requiring MaxA. Pt.'s UEs were positioned on pillows. Pt. Nurse was educated about UE positioning. Pt. Continues to benefit from OT services for ADL training, Positioning and pt./caregiver Education  about positioning  For ADLs, Pt. would benefit from SNF level of care upon discharge. Pt. Could benefit from follow-up OT services at discharge.   Follow Up Recommendations  SNF    Equipment Recommendations       Recommendations for Other Services      Precautions / Restrictions Precautions Precautions: Fall Restrictions Weight Bearing Restrictions: No       Mobility Bed Mobility               General bed mobility comments: deferred due to lethargy  Transfers                 General transfer comment: deferred    Balance                                           ADL either performed or assessed with clinical judgement   ADL   Eating/Feeding: Total assistance   Grooming: Total assistance   Upper Body Bathing: Total assistance   Lower Body Bathing: Total assistance   Upper Body Dressing : Total assistance   Lower Body Dressing: Total assistance   Toilet Transfer: Total assistance   Toileting- Clothing Manipulation and Hygiene: Total assistance               Vision       Perception     Praxis      Cognition Arousal/Alertness:  Lethargic(Intermittent periods of opening eyes to verbal stimulli) Behavior During Therapy: Flat affect Overall Cognitive Status: Difficult to assess                   Orientation Level: Disoriented to;Place;Time;Situation             General Comments: Very limited interaction        Exercises Slow gentle PROM  Shoulder Instructions       General Comments      Pertinent Vitals/ Pain       Pain Assessment: (Not Rated)  Home Living                                          Prior Functioning/Environment              Frequency  Min 1X/week        Progress Toward Goals  OT Goals(current goals can now be found in the care plan section)  Progress towards OT goals: OT to reassess next treatment  Acute Rehab OT Goals Patient Stated Goal: none stated OT Goal Formulation: Patient unable to participate in goal setting  Plan      Co-evaluation                 AM-PAC PT "6 Clicks" Daily Activity     Outcome Measure   Help from another person eating meals?: Total Help from another person taking care of personal grooming?: Total Help from another person toileting, which includes using toliet, bedpan, or urinal?: Total Help from another person bathing (including washing, rinsing, drying)?: Total Help from another person to put on and taking off regular upper body clothing?: Total Help from another person to put on and taking off regular lower body clothing?: Total 6 Click Score: 6    End of Session        Activity Tolerance Patient limited by lethargy;Patient limited by fatigue   Patient Left in bed;with family/visitor present;with call bell/phone within reach;with bed alarm set(Bilateral mitt restraints in place)   Nurse Communication          Time: 1130-1145 OT Time Calculation (min): 15 min  Charges: OT General Charges $OT Visit: 1 Visit OT Treatments $Self Care/Home Management : 8-22 mins  Harrel Carina, MS,  OTR/L    Harrel Carina, MS, OTR/L 02/26/2018, 11:59 AM

## 2018-02-26 NOTE — Care Management Important Message (Signed)
Copy of signed IM left with patient in room.  

## 2018-02-26 NOTE — Progress Notes (Deleted)
Pre HD Treatment    02/26/18 1352  Vital Signs  Temp 98.1 F (36.7 C)  Temp Source Axillary  Pulse Rate Source Monitor  Resp 18  BP (!) 197/94  BP Location Right Arm  BP Method Automatic  Patient Position (if appropriate) Lying  Oxygen Therapy  SpO2 100 %  O2 Device Room Air  Pain Assessment  Pain Scale 0-10  Pain Score 0  Dialysis Weight  Weight  (unable to obtain, pt unsteady)  Time-Out for Hemodialysis  What Procedure? HD  Pt Identifiers(min of two) First/Last Name;MRN/Account#;Pt's DOB(use if MRN/Acct# not available  Correct Site? Yes  Correct Side? Yes  Correct Procedure? Yes  Consents Verified? Yes  Rad Studies Available? N/A  Safety Precautions Reviewed? Yes  [employer] Number [id number]  Station Number 3  UF/Alarm Test Passed  Conductivity: Meter 14  Conductivity: Machine  14.2  pH 7  Reverse Osmosis Main  Machine Temperature 97.7 F (36.5 C)  Musician and Audible Yes  Blood Lines Intact and Secured Yes  Pre Treatment Patient Checks  Vascular access used during treatment Graft  Hepatitis B Surface Antigen Results Negative  Date Hepatitis B Surface Antigen Drawn 01/27/18  Hepatitis B Surface Antibody 66  Date Hepatitis B Surface Antibody Drawn 04/09/17  Hemodialysis Consent Verified Yes  Hemodialysis Standing Orders Initiated Yes  ECG (Telemetry) Monitor On Yes  Prime Ordered Normal Saline  Length of  DialysisTreatment -hour(s) 3.5 Hour(s) (3.5 Hours)  Dialyzer Elisio 17H NR  Dialysate 2K, 2.5 Ca  Dialysis Anticoagulant None  Dialysate Flow Ordered 800  Blood Flow Rate Ordered 400 mL/min  Ultrafiltration Goal 1 Liters (1 Liter)  Pre Treatment Labs Phosphorus  Dialysis Blood Pressure Support Ordered Normal Saline  Education / Care Plan  Dialysis Education Provided Yes  Documented Education in Care Plan Yes  Fistula / Graft Left Forearm Arteriovenous vein graft  No Placement Date or Time found.   Placed prior to admission:  Yes  Orientation: Left  Access Location: Forearm  Access Type: Arteriovenous vein graft  Site Condition Red/inflamed;Other (Comment) (Bruising noted)  Fistula / Graft Assessment Present;Thrill;Bruit  Drainage Description None

## 2018-02-26 NOTE — Progress Notes (Signed)
[place], [place] 02/26/18  Subjective:  Patient due for dialysis.   Still quite weak. Refusing to eat this a.m.   Objective:  Vital signs in last 24 hours:  Temp:  [97.6 F (36.4 C)-99.3 F (37.4 C)] 98 F (36.7 C) (09/12 0438) Pulse Rate:  [76-92] 76 (09/12 0438) Resp:  [17-20] 20 (09/12 0438) BP: (135-161)/(77-86) 142/81 (09/12 0438) SpO2:  [97 %-100 %] 97 % (09/12 0438) Weight:  [71.6 kg] 71.6 kg (09/12 0438)  Weight change: -0.4 kg Filed Weights   02/24/18 1345 02/24/18 1725 02/26/18 0438  Weight: 72 kg 71 kg 71.6 kg    Intake/Output:    Intake/Output Summary (Last 24 hours) at 02/26/2018 0929 Last data filed at 02/25/2018 1940 Gross per 24 hour  Intake 350 ml  Output 200 ml  Net 150 ml     Physical Exam: General: NAD, laying in bed  HEENT Anicteric, moist mucus membranes  Neck supple  Pulm/lungs CTAB, normal effort  CVS/Heart S1S2 no rubs  Abdomen:  Soft, NTND BS present  Extremities: no edema  Neurologic: Able to follow a few basic commands  Skin: No rashes  Access: Left forearm AVG       Basic Metabolic Panel:  Recent Labs  Lab 02/20/18 0454 02/24/18 1427  NA 140 141  K 4.2 3.9  CL 98 103  CO2 28 28  GLUCOSE 126* 83  BUN 22 25*  CREATININE 8.04* 12.49*  CALCIUM 9.1 9.0  PHOS  --  2.3*     CBC: Recent Labs  Lab 02/24/18 1427  WBC 7.2  HGB 9.9*  HCT 30.8*  MCV 75.0*  PLT 173      Lab Results  Component Value Date   HEPBSAG Negative 09/05/2016      Microbiology:  Recent Results (from the past 240 hour(s))  Culture, blood (routine x 2)     Status: None   Collection Time: 02/19/18  9:17 AM  Result Value Ref Range Status   Specimen Description BLOOD RIGHT ARM  Final   Special Requests   Final    BOTTLES DRAWN AEROBIC AND ANAEROBIC Blood Culture adequate volume   Culture   Final    NO GROWTH 5 DAYS Performed at [facility], [address] [place], [place] [address]    Report Status 02/24/2018 FINAL  Final  Culture, blood (routine x 2)     Status: Abnormal   Collection Time: 02/19/18  9:48 AM  Result Value Ref Range Status   Specimen Description   Final    BLOOD RIGHT FINGER Performed at [facility], [address], [place], [place] [address]    Special Requests   Final    BOTTLES DRAWN AEROBIC AND ANAEROBIC Blood Culture adequate volume Performed at [facility], [address] [place], [place] [address]    Culture  Setup Time   Final    IN BOTH AEROBIC AND ANAEROBIC BOTTLES GRAM POSITIVE COCCI IN CLUSTERS GRAM POSITIVE RODS CRITICAL RESULT CALLED TO, READ BACK BY AND VERIFIED WITH: C/MATT MCBANE @0220  02/20/18 FLC    Culture (A)  Final    STAPHYLOCOCCUS SPECIES (COAGULASE NEGATIVE) THE SIGNIFICANCE OF ISOLATING THIS ORGANISM FROM A SINGLE SET OF BLOOD CULTURES WHEN MULTIPLE SETS ARE DRAWN IS UNCERTAIN. PLEASE NOTIFY THE MICROBIOLOGY DEPARTMENT WITHIN ONE WEEK IF SPECIATION AND SENSITIVITIES ARE REQUIRED. BACILLUS SPECIES Standardized susceptibility testing for this organism is not available. Performed at [facility], [place] [address] [place], [place]  [address]    Report Status 02/22/2018 FINAL  Final  Blood Culture ID Panel (Reflexed)     Status: Abnormal   Collection Time: 02/19/18  9:48 AM  Result Value Ref Range Status   Enterococcus species NOT DETECTED NOT DETECTED Final   Listeria monocytogenes NOT DETECTED NOT DETECTED Final   Staphylococcus species DETECTED (A) NOT DETECTED Final    Comment: Methicillin (oxacillin) susceptible coagulase negative staphylococcus. Possible blood culture contaminant (unless isolated from more than one blood culture draw or clinical case suggests pathogenicity). No antibiotic treatment is indicated for blood  culture contaminants. CRITICAL RESULT CALLED TO, READ BACK BY AND VERIFIED WITH: C/MATT MCBANE @0220  02/20/18 FLC    Staphylococcus aureus NOT DETECTED NOT DETECTED Final    Methicillin resistance NOT DETECTED NOT DETECTED Final   Streptococcus species NOT DETECTED NOT DETECTED Final   Streptococcus agalactiae NOT DETECTED NOT DETECTED Final   Streptococcus pneumoniae NOT DETECTED NOT DETECTED Final   Streptococcus pyogenes NOT DETECTED NOT DETECTED Final   Acinetobacter baumannii NOT DETECTED NOT DETECTED Final   Enterobacteriaceae species NOT DETECTED NOT DETECTED Final   Enterobacter cloacae complex NOT DETECTED NOT DETECTED Final   Escherichia coli NOT DETECTED NOT DETECTED Final   Klebsiella oxytoca NOT DETECTED NOT DETECTED Final   Klebsiella pneumoniae NOT DETECTED NOT DETECTED Final   Proteus species NOT DETECTED NOT DETECTED Final   Serratia marcescens NOT DETECTED NOT DETECTED Final   Haemophilus influenzae NOT DETECTED NOT DETECTED Final   Neisseria meningitidis NOT DETECTED NOT DETECTED Final   Pseudomonas aeruginosa NOT DETECTED NOT DETECTED Final   Candida albicans NOT DETECTED NOT DETECTED Final   Candida glabrata NOT DETECTED NOT DETECTED Final   Candida krusei NOT DETECTED NOT DETECTED Final   Candida parapsilosis NOT DETECTED NOT DETECTED Final   Candida tropicalis NOT DETECTED NOT DETECTED Final    Comment: Performed at [facility], [address] [place], [place] [address]  MRSA PCR Screening     Status: None   Collection Time: 02/19/18 12:36 PM  Result Value Ref Range Status   MRSA by PCR NEGATIVE NEGATIVE Final    Comment:        The GeneXpert MRSA Assay (FDA approved for NASAL specimens only), is one component of a comprehensive MRSA colonization surveillance program. It is not intended to diagnose MRSA infection nor to guide or monitor treatment for MRSA infections. Performed at [facility], [address] [place], [place] [address]   CULTURE, BLOOD (ROUTINE X 2) w Reflex to ID Panel     Status: None   Collection Time: 02/20/18 11:27 PM  Result Value Ref Range Status   Specimen Description BLOOD  RIGHT HAND  Final   Special Requests   Final    BOTTLES DRAWN AEROBIC AND ANAEROBIC Blood Culture results may not be optimal due to an excessive volume of blood received in culture bottles   Culture   Final    NO GROWTH 5 DAYS Performed at [facility], [address] [place], [place] [address]    Report Status 02/25/2018 FINAL  Final  CULTURE, BLOOD (ROUTINE X 2) w Reflex to ID Panel     Status: None   Collection Time: 02/20/18 11:39 PM  Result Value Ref Range Status   Specimen Description BLOOD RIGHT ANTECUBITAL  Final   Special Requests   Final    BOTTLES DRAWN AEROBIC AND ANAEROBIC Blood Culture results may not be optimal due to an excessive volume of blood  received in culture bottles   Culture   Final    NO GROWTH 5 DAYS Performed at [facility], [address] [place], [place] [address]    Report Status 02/25/2018 FINAL  Final    Coagulation Studies: No results for input(s): LABPROT, INR in the last 72 hours.  Urinalysis: No results for input(s): COLORURINE, LABSPEC, PHURINE, GLUCOSEU, HGBUR, BILIRUBINUR, KETONESUR, PROTEINUR, UROBILINOGEN, NITRITE, LEUKOCYTESUR in the last 72 hours.  Invalid input(s): APPERANCEUR    Imaging: No results found.   Medications:   . aspirin EC  81 mg Oral Daily  . atorvastatin  80 mg Oral q1800  . calcium acetate  1,334 mg Oral TID WC  . Chlorhexidine Gluconate Cloth  6 each Topical Q0600  . clopidogrel  75 mg Oral Q breakfast  . famotidine  20 mg Oral Q48H  . heparin  5,000 Units Subcutaneous Q8H  . hydrALAZINE  25 mg Oral Q8H  . insulin aspart  0-5 Units Subcutaneous QHS  . insulin aspart  0-9 Units Subcutaneous TID WC  . levETIRAcetam  250 mg Oral Once per day on Tue Thu Sat  . levETIRAcetam  750 mg Oral Daily  . losartan  25 mg Oral Daily  . metoprolol succinate  12.5 mg Oral Q T,Th,Sat-1800  . metoprolol succinate  25 mg Oral Q M,W,F,Su-1800  . multivitamin  1 tablet Oral QHS  . sodium chloride flush   3 mL Intravenous Q12H      Assessment/ Plan:  67 y.o. 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   TTS [facility] [address]/70 kg/  1. ESRD -Patient due for hemodialysis today.  Orders have been prepared.  2. AOCKD -Hemoglobin currently 9.9.  Epogen has been on hold given recent CVA.  3. SHPTH -We plan to recheck serum phosphorus today.  Poor p.o. intake noted.  We may need to stop phosphorus binders.  4. Altered mental status/right basal ganglia infarct -It appears patient will be transition to a nursing home.  5.  Swelling around AV graft Reassess this today at the time of dialysis.    LOS: 7 Lance Galas 9/12/20199:29 AM  [place], [place]  Note: This note was prepared with Dragon dictation. Any transcription errors are unintentional

## 2018-02-26 NOTE — Progress Notes (Signed)
HD Treatment Complete    02/26/18 1820  Vital Signs  Pulse Rate 97  Pulse Rate Source Monitor  Resp 17  BP 129/73  BP Location Right Arm  BP Method Automatic  Patient Position (if appropriate) Sitting  Oxygen Therapy  SpO2 100 %  O2 Device Room Air  During Hemodialysis Assessment  Intra-Hemodialysis Comments See progress note;Tx completed  Fistula / Graft Left Forearm Arteriovenous vein graft  No Placement Date or Time found.   Placed prior to admission: Yes  Orientation: Left  Access Location: Forearm  Access Type: Arteriovenous vein graft  Status Deaccessed

## 2018-02-26 NOTE — Progress Notes (Signed)
PT Cancellation Note  Patient Details Name: [name] MRN: [id number] DOB: 10-Oct-1950   Contact Note:     Per discussion with PTA, patient's treatment frequency changed/updated to 2x/week due to limited ability to actively participate/progress with current 7x/week frequency.  Will continue to monitor and update again as appropriate.  [name], PT, DPT, NCS 02/26/18, 10:32 PM [phone]

## 2018-02-26 NOTE — Progress Notes (Signed)
Pre HD Treatment    02/26/18 1352  Vital Signs  Temp 98.1 F (36.7 C)  Temp Source Axillary  Pulse Rate Source Monitor  Resp 18  BP (!) 197/94  BP Location Right Arm  BP Method Automatic  Patient Position (if appropriate) Lying  Oxygen Therapy  SpO2 100 %  O2 Device Room Air  Pain Assessment  Pain Scale 0-10  Pain Score 0  Dialysis Weight  Weight  (unable to obtain, pt unsteady)  Time-Out for Hemodialysis  What Procedure? HD  Pt Identifiers(min of two) First/Last Name;MRN/Account#;Pt's DOB(use if MRN/Acct# not available  Correct Site? Yes  Correct Side? Yes  Correct Procedure? Yes  Consents Verified? Yes  Rad Studies Available? N/A  Safety Precautions Reviewed? Yes  Machine [employer] Number [id number]  Station Number 3  UF/Alarm Test Passed  Conductivity: Meter 14  Conductivity: Machine  14.2  pH 7  Reverse Osmosis Main  Normal Saline Lot Number [id number]  Dialyzer Lot Number [id number]  Disposable Set Lot Number [id number]  Machine Temperature 97.7 F (36.5 C)  Musician and Audible Yes  Blood Lines Intact and Secured Yes  Pre Treatment Patient Checks  Vascular access used during treatment Graft  Patient is receiving dialysis in a chair Yes  Hepatitis B Surface Antigen Results Negative  Date Hepatitis B Surface Antigen Drawn 01/27/18  Hepatitis B Surface Antibody 66  Date Hepatitis B Surface Antibody Drawn 04/09/17  Hemodialysis Consent Verified Yes  Hemodialysis Standing Orders Initiated Yes  ECG (Telemetry) Monitor On Yes  Prime Ordered Normal Saline  Length of  DialysisTreatment -hour(s) 3.5 Hour(s) (3.5 Hours)  Dialyzer Elisio 17H NR  Dialysate 2K, 2.5 Ca  Dialysis Anticoagulant None  Dialysate Flow Ordered 800  Blood Flow Rate Ordered 400 mL/min  Ultrafiltration Goal 1 Liters (1 Liter)  Pre Treatment Labs Phosphorus  Dialysis Blood Pressure Support Ordered Normal Saline  Education / Care Plan  Dialysis Education Provided Yes  Documented  Education in Care Plan Yes  Fistula / Graft Left Forearm Arteriovenous vein graft  No Placement Date or Time found.   Placed prior to admission: Yes  Orientation: Left  Access Location: Forearm  Access Type: Arteriovenous vein graft  Site Condition Red/inflamed;Other (Comment) (Bruising noted)  Fistula / Graft Assessment Present;Thrill;Bruit  Drainage Description None

## 2018-02-26 NOTE — Progress Notes (Signed)
Pt. returned to floor from dialysis. He sat up on the chair for the whole treatment. He was asleep half of the time but awake and interactive the rest of the time per dialysis RN.

## 2018-02-26 NOTE — Progress Notes (Signed)
HD Treatment Incomplete  Patient's line set clotted. Able to rinse back half of blood. Estimated blood loss was 100 mL. MD notified. Ordered to restart treatment.     02/26/18 1428  Vital Signs  Pulse Rate 83  Pulse Rate Source Monitor  Resp (!) 25  BP (!) 183/97  BP Location Right Arm  BP Method Automatic  Patient Position (if appropriate) Sitting  During Hemodialysis Assessment  Intra-Hemodialysis Comments See progress note  Post-Hemodialysis Assessment  Rinseback Volume (mL) 250 mL  Dialyzer Clearance Clotted  Duration of HD Treatment -hour(s) 3 hour(s) (3 minutes)  Hemodialysis Intake (mL) 500 mL  UF Total -Machine (mL) 12 mL  Net UF (mL) -488 mL  Tolerated HD Treatment Yes  Fistula / Graft Left Forearm Arteriovenous vein graft  No Placement Date or Time found.   Placed prior to admission: Yes  Orientation: Left  Access Location: Forearm  Access Type: Arteriovenous vein graft  Status Flushed;Other (Comment)

## 2018-02-26 NOTE — Progress Notes (Signed)
Post HD Assessment    02/26/18 1830  Neurological  Level of Consciousness Alert  Orientation Level Oriented to person;Oriented to situation;Disoriented to place;Disoriented to time  Cardiac  Pulse Regular  Heart Sounds S1, S2  Jugular Venous Distention (JVD) No  ECG Monitor Yes  Cardiac Rhythm NSR;ST  Ectopy Unifocal PVC's  Ectopy Frequency Occasional  Antiarrhythmic device No  Vascular  R Radial Pulse +2  L Radial Pulse +2  Edema Right lower extremity;Left lower extremity  RLE Edema Non-pitting  LLE Edema Non-Pitting  Integumentary  Integumentary (WDL) X  Skin Color Appropriate for ethnicity  Skin Condition Dry  Skin Integrity Other (Comment) (Bruising to left lower arm)  Ecchymosis Location Arm  Ecchymosis Location Orientation Left;Lower  Musculoskeletal  Musculoskeletal (WDL) X  Generalized Weakness Yes  GU Assessment  Genitourinary (WDL) X (HD pt)  Psychosocial  Psychosocial (WDL) X  Patient Behaviors Cooperative;Anxious;Restless  Needs Expressed Denies  Emotional support given Given to patient (Notified primary nurse pt restless, pulling at tubes)

## 2018-02-26 NOTE — Progress Notes (Addendum)
HD Treatment Ended    02/26/18 1425  Vital Signs  Pulse Rate 85  Resp (!) 24  BP Location Right Arm  BP Method Automatic  Patient Position (if appropriate) Sitting  During Hemodialysis Assessment  Blood Flow Rate (mL/min) 400 mL/min  Arterial Pressure (mmHg) -140 mmHg  Venous Pressure (mmHg) 200 mmHg  Transmembrane Pressure (mmHg) 80 mmHg  Ultrafiltration Rate (mL/min) 430 mL/min  Dialysate Flow Rate (mL/min) 800 ml/min  Conductivity: Machine  14  HD Safety Checks Performed Yes  Intra-Hemodialysis Comments Tx initiated  Fistula / Graft Left Forearm Arteriovenous vein graft  No Placement Date or Time found.   Placed prior to admission: Yes  Orientation: Left  Access Location: Forearm  Access Type: Arteriovenous vein graft  Status Accessed  Needle Size 15

## 2018-02-26 NOTE — Progress Notes (Signed)
[place] [facility] [place] NAME: [name]    MR#:  [id number]  DATE OF BIRTH:  September 06, 1950  SUBJECTIVE:  CHIEF COMPLAINT:  No chief complaint on file. Patient is demented or confused.  Noncommunicative.  He opened eyes on verbal stimuli. REVIEW OF SYSTEMS:    Review of Systems  Unable to perform ROS: Mental status change    DRUG ALLERGIES:   Allergies  Allergen Reactions  . Ivp Dye [Iodinated Diagnostic Agents] Other (See Comments)    Pt denied    VITALS:  Blood pressure (!) 174/82, pulse 88, temperature 98.1 F (36.7 C), temperature source Oral, resp. rate 18, height 5\' 5"  (1.651 m), weight 71.6 kg, SpO2 100 %.  PHYSICAL EXAMINATION:  GENERAL:  67 y.o.-year-old patient lying in the bed with no acute distress.  EYES: Pupils equal, round, reactive to light and accommodation. No scleral icterus. Extraocular muscles intact.  HEENT: Head atraumatic, normocephalic.  NECK:  Supple, no jugular venous distention. No thyroid enlargement, no tenderness.  LUNGS: Normal breath sounds bilaterally, no wheezing, rales,rhonchi or crepitation. No use of accessory muscles of respiration.  CARDIOVASCULAR: S1, S2 normal. No murmurs, rubs, or gallops.  ABDOMEN: Soft, nontender, nondistended. Bowel sounds present. No organomegaly or mass.  EXTREMITIES: No pedal edema, cyanosis, or clubbing.  NEUROLOGIC: Unable to exam. PSYCHIATRIC: The patient is confused and noncommunicative. SKIN: No obvious rash, lesion, or ulcer.   Physical Exam LABORATORY PANEL:   CBC Recent Labs  Lab 02/24/18 1427  WBC 7.2  HGB 9.9*  HCT 30.8*  PLT 173   ------------------------------------------------------------------------------------------------------------------  Chemistries  Recent Labs  Lab 02/24/18 1427  NA 141  K 3.9  CL 103  CO2 28  GLUCOSE 83  BUN 25*  CREATININE 12.49*  CALCIUM 9.0    ------------------------------------------------------------------------------------------------------------------  Cardiac Enzymes Recent Labs  Lab 02/19/18 2220 02/20/18 0454  TROPONINI 0.07* 0.09*   ------------------------------------------------------------------------------------------------------------------  RADIOLOGY:  No results found.  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  Echocardiogram: LV EF: 30% -   35%, carotid Dopplers is unremarkable.  Aspiration/fall/skin care precautions while in house, and continue close medical monitoring. Continue aspirin 81 mg/day and Plavix 75 mg per Dr. Doy Mince. Waiting for SNF placement.  *Acute gram-positive cocci blood infection He was on IV vancomycin, repeated blood cultures are negative so far.  Discontinued vancomycin if repeated blood culture is negative per ID Dr. Steva Ready.  *Acute probable left community-acquired pneumonia Completed Rocephin/azithromycin.  *Chronic ESRD on HD TTS Continue hemodialysis.  The patient needs to sit on chair to have hemodialysis for skilled nursing facility placement.  *History of peptic ulcer disease PPI daily  *Acute accelerated hypertension Continue Lopressor, and hydralazine p.o. and IV as needed. Controlled.  *History of seizure disorder Continue Keppra, seizure precautions  Hypoglycemia.  D50 IV and monitor blood glucose.  Improved.  Diabetes 2.  Changed to sensitive sliding scale.  Acute metabolic encephalopathy and possible underlying dementia. Per his wife, the patient has confusion at baseline, but worse this time. Aspiration fall precaution.  Tachycardia and bradycardia.  Follow-up Dr. Alveria Apley  recommendation.  Very poor prognosis.  Palliative care staff is following.  Family is not ready for hospice. All the records are reviewed and case discussed with Care Management/Social Workerr. Management plans discussed with the  patient, his wife and daughter and they are in agreement.  CODE STATUS: dnr  TOTAL TIME TAKING CARE OF THIS PATIENT: 27 minutes.  POSSIBLE D/C IN 1-2 DAYS, DEPENDING ON CLINICAL CONDITION.   Demetrios Loll M.D on 02/26/2018   Between 7am to 6pm - Pager - [phone]  After 6pm go to [email or web address] - password [email or web address] [facility] [place] [facility]  Office  [phone]  CC: Primary care physician; [name], MD  Note: This dictation was prepared with Dragon dictation along with smaller phrase technology. Any transcriptional errors that result from this process are unintentional.

## 2018-02-26 NOTE — Progress Notes (Signed)
HD Re-initiated  HD treatment re-initiated due to previous clotting of set and dialyzer. Pt tolerated re-initiation well.     02/26/18 1445  Vital Signs  Pulse Rate (!) 104  Pulse Rate Source Monitor  Resp 18  BP (!) 125/112  BP Location Right Arm  BP Method Automatic  Patient Position (if appropriate) Lying  Oxygen Therapy  SpO2 100 %  O2 Device Room [employer]  Machine Number [id number]  Station Number 2  UF/Alarm Test Passed  Conductivity: Meter 14  Conductivity: Machine  13.8  pH 7.2  Reverse Osmosis Main  Normal Saline Lot Number [id number]  Dialyzer Lot Number [id number]  Disposable Set Lot Number [id number]  Machine Temperature 98.6 F (37 C)  Musician and Audible Yes  Blood Lines Intact and Secured Yes  Pre Treatment Patient Checks  Vascular access used during treatment Graft  Patient is receiving dialysis in a chair Yes  Hepatitis B Surface Antigen Results Negative  Date Hepatitis B Surface Antigen Drawn 01/27/18  Hepatitis B Surface Antibody 66  Date Hepatitis B Surface Antibody Drawn 04/09/18  Hemodialysis Consent Verified Yes  Hemodialysis Standing Orders Initiated Yes  ECG (Telemetry) Monitor On Yes  Prime Ordered Normal Saline  Length of  DialysisTreatment -hour(s) 3.5 Hour(s)  Dialyzer Elisio 17H NR  Dialysate 2K, 2.5 Ca  Dialysate Flow Ordered 800  Blood Flow Rate Ordered 400 mL/min  Ultrafiltration Goal 1 Liters  Pre Treatment Labs Phosphorus  Dialysis Blood Pressure Support Ordered Normal Saline  During Hemodialysis Assessment  Blood Flow Rate (mL/min) 400 mL/min  Arterial Pressure (mmHg) -160 mmHg  Venous Pressure (mmHg) 190 mmHg  Transmembrane Pressure (mmHg) 60 mmHg  Ultrafiltration Rate (mL/min) 560 mL/min  Dialysate Flow Rate (mL/min) 800 ml/min  Conductivity: Machine  14.1  HD Safety Checks Performed Yes  Dialysis Fluid Bolus Normal Saline  Bolus Amount (mL) 250 mL  Intra-Hemodialysis Comments Tx initiated  Education / Care  Plan  Dialysis Education Provided Yes  Documented Education in Care Plan Yes  Fistula / Graft Left Forearm Arteriovenous vein graft  No Placement Date or Time found.   Placed prior to admission: Yes  Orientation: Left  Access Location: Forearm  Access Type: Arteriovenous vein graft  Status Accessed;Flushed;Patent

## 2018-02-26 NOTE — Progress Notes (Signed)
Pre HD Assessment    02/26/18 1400  Neurological  Level of Consciousness Alert  Orientation Level Oriented to person;Oriented to situation;Disoriented to place;Disoriented to time  RUE Motor Strength 5  LUE Motor Strength 5  Respiratory  Respiratory Pattern Regular;Unlabored;Symmetrical  Chest Assessment Chest expansion symmetrical  Bilateral Breath Sounds Diminished  Cough Non-productive  Cardiac  Pulse Regular  Heart Sounds S1, S2  Jugular Venous Distention (JVD) No  ECG Monitor Yes  Cardiac Rhythm NSR;ST  Ectopy Unifocal PVC's  Ectopy Frequency Occasional  Antiarrhythmic device No  Vascular  R Radial Pulse +2  L Radial Pulse +2  Edema Right lower extremity;Left lower extremity  RLE Edema Non-pitting  LLE Edema Non-Pitting  Integumentary  Integumentary (WDL) X  Skin Color Appropriate for ethnicity  Skin Condition Dry  Skin Integrity Other (Comment) (Bruising to left lower arm)  Ecchymosis Location Arm  Ecchymosis Location Orientation Left;Lower  Musculoskeletal  Musculoskeletal (WDL) X  Generalized Weakness Yes  GU Assessment  Genitourinary (WDL) X (HD pt)  Psychosocial  Psychosocial (WDL) X  Patient Behaviors Cooperative;Anxious;Restless  Needs Expressed Denies  Emotional support given Given to patient (Notified primary nurse pt restless, pulling at tubes)

## 2018-02-26 NOTE — Progress Notes (Signed)
UF turned off and BFR turned down due to pt experiencing hypotension. MD aware, states to continue to monitor pt.

## 2018-02-27 LAB — CBC
HCT: 35 % — ABNORMAL LOW (ref 40.0–52.0)
Hemoglobin: 11.2 g/dL — ABNORMAL LOW (ref 13.0–18.0)
MCH: 24.3 pg — ABNORMAL LOW (ref 26.0–34.0)
MCHC: 32.1 g/dL (ref 32.0–36.0)
MCV: 75.6 fL — ABNORMAL LOW (ref 80.0–100.0)
Platelets: 201 10*3/uL (ref 150–440)
RBC: 4.63 MIL/uL (ref 4.40–5.90)
RDW: 15.8 % — AB (ref 11.5–14.5)
WBC: 6.5 10*3/uL (ref 3.8–10.6)

## 2018-02-27 LAB — GLUCOSE, CAPILLARY
GLUCOSE-CAPILLARY: 93 mg/dL (ref 70–99)
Glucose-Capillary: 126 mg/dL — ABNORMAL HIGH (ref 70–99)

## 2018-02-27 MED ORDER — NEPRO/CARBSTEADY PO LIQD: 237.0000 mL | Freq: Two times a day (BID) | ORAL | 0 refills | Status: DC

## 2018-02-27 MED ORDER — ASCORBIC ACID 250 MG PO TABS: 250.0000 mg | ORAL_TABLET | Freq: Two times a day (BID) | ORAL | Status: DC

## 2018-02-27 NOTE — Progress Notes (Signed)
Patient is medically stable for D/C to [facility] today. Patient sat up for dialysis session yesterday. Per Otila Kluver [facility] liaison patient can come today to room 503. RN will call report and arrange EMS for transport. Clinical Education officer, museum (CSW) sent D/C orders to [facility] via [facility]. Patient's wife [name] is at bedside and aware of above. Please reconsult if future social work needs arise. CSW signing off.   [employer], LCSW [phone]

## 2018-02-27 NOTE — Progress Notes (Signed)
[place], [place] 02/27/18  Subjective:  Patient seen at bedside. Was awake and alert this a.m. Completed hemodialysis yesterday.   Objective:  Vital signs in last 24 hours:  Temp:  [97.5 F (36.4 C)-98.1 F (36.7 C)] 97.6 F (36.4 C) (09/13 0526) Pulse Rate:  [80-105] 80 (09/13 0905) Resp:  [9-25] 20 (09/13 0526) BP: (86-197)/(54-144) 145/65 (09/13 0905) SpO2:  [98 %-100 %] 98 % (09/13 0526) Weight:  [70.4 kg] 70.4 kg (09/13 0526)  Weight change: -1.156 kg Filed Weights   02/26/18 0438 02/27/18 0526  Weight: 71.6 kg 70.4 kg    Intake/Output:    Intake/Output Summary (Last 24 hours) at 02/27/2018 0932 Last data filed at 02/27/2018 0902 Gross per 24 hour  Intake 3 ml  Output 493 ml  Net -490 ml     Physical Exam: General: NAD, laying in bed  HEENT Anicteric, moist mucus membranes  Neck supple  Pulm/lungs CTAB, normal effort  CVS/Heart S1S2 no rubs  Abdomen:  Soft, NTND BS present  Extremities: no edema  Neurologic: Able to follow a few basic commands  Skin: No rashes  Access: Left forearm AVG       Basic Metabolic Panel:  Recent Labs  Lab 02/24/18 1427 02/26/18 1512  NA 141  --   K 3.9  --   CL 103  --   CO2 28  --   GLUCOSE 83  --   BUN 25*  --   CREATININE 12.49*  --   CALCIUM 9.0  --   PHOS 2.3* 2.8     CBC: Recent Labs  Lab 02/24/18 1427 02/27/18 0455  WBC 7.2 6.5  HGB 9.9* 11.2*  HCT 30.8* 35.0*  MCV 75.0* 75.6*  PLT 173 201      Lab Results  Component Value Date   HEPBSAG Negative 09/05/2016      Microbiology:  Recent Results (from the past 240 hour(s))  Culture, blood (routine x 2)     Status: None   Collection Time: 02/19/18  9:17 AM  Result Value Ref Range Status   Specimen Description BLOOD RIGHT ARM  Final   Special Requests   Final    BOTTLES DRAWN AEROBIC AND ANAEROBIC Blood Culture adequate volume   Culture   Final    NO GROWTH 5 DAYS Performed at [facility], [address] [place], [place] [address]    Report Status 02/24/2018 FINAL  Final  Culture, blood (routine x 2)     Status: Abnormal   Collection Time: 02/19/18  9:48 AM  Result Value Ref Range Status   Specimen Description   Final    BLOOD RIGHT FINGER Performed at [facility], [address] [place], [place] [address]    Special Requests   Final    BOTTLES DRAWN AEROBIC AND ANAEROBIC Blood Culture adequate volume Performed at [facility], [address] [place], [place] [address]    Culture  Setup Time   Final    IN BOTH AEROBIC AND ANAEROBIC BOTTLES GRAM POSITIVE COCCI IN CLUSTERS [place] CRITICAL RESULT CALLED TO, READ BACK BY AND VERIFIED WITH: C/MATT MCBANE @0220  02/20/18 [facility]    Culture (A)  Final    STAPHYLOCOCCUS SPECIES (COAGULASE NEGATIVE) THE SIGNIFICANCE OF ISOLATING THIS ORGANISM FROM A SINGLE SET OF BLOOD CULTURES WHEN MULTIPLE SETS ARE DRAWN IS UNCERTAIN. PLEASE NOTIFY THE MICROBIOLOGY DEPARTMENT WITHIN ONE WEEK IF SPECIATION AND SENSITIVITIES ARE REQUIRED. BACILLUS SPECIES Standardized susceptibility testing for  this organism is not available. Performed at [facility], [address] [place], [place] [address]    Report Status 02/22/2018 FINAL  Final  Blood Culture ID Panel (Reflexed)     Status: Abnormal   Collection Time: 02/19/18  9:48 AM  Result Value Ref Range Status   Enterococcus species NOT DETECTED NOT DETECTED Final   Listeria monocytogenes NOT DETECTED NOT DETECTED Final   Staphylococcus species DETECTED (A) NOT DETECTED Final    Comment: Methicillin (oxacillin) susceptible coagulase negative staphylococcus. Possible blood culture contaminant (unless isolated from more than one blood culture draw or clinical case suggests pathogenicity). No antibiotic treatment is indicated for blood  culture contaminants. CRITICAL RESULT CALLED TO, READ BACK BY AND VERIFIED WITH: C/MATT MCBANE @0220  02/20/18 [facility]    Staphylococcus  aureus NOT DETECTED NOT DETECTED Final   Methicillin resistance NOT DETECTED NOT DETECTED Final   Streptococcus species NOT DETECTED NOT DETECTED Final   Streptococcus agalactiae NOT DETECTED NOT DETECTED Final   Streptococcus pneumoniae NOT DETECTED NOT DETECTED Final   Streptococcus pyogenes NOT DETECTED NOT DETECTED Final   Acinetobacter baumannii NOT DETECTED NOT DETECTED Final   Enterobacteriaceae species NOT DETECTED NOT DETECTED Final   Enterobacter cloacae complex NOT DETECTED NOT DETECTED Final   Escherichia coli NOT DETECTED NOT DETECTED Final   Klebsiella oxytoca NOT DETECTED NOT DETECTED Final   Klebsiella pneumoniae NOT DETECTED NOT DETECTED Final   Proteus species NOT DETECTED NOT DETECTED Final   Serratia marcescens NOT DETECTED NOT DETECTED Final   Haemophilus influenzae NOT DETECTED NOT DETECTED Final   Neisseria meningitidis NOT DETECTED NOT DETECTED Final   Pseudomonas aeruginosa NOT DETECTED NOT DETECTED Final   Candida albicans NOT DETECTED NOT DETECTED Final   Candida glabrata NOT DETECTED NOT DETECTED Final   Candida krusei NOT DETECTED NOT DETECTED Final   Candida parapsilosis NOT DETECTED NOT DETECTED Final   Candida tropicalis NOT DETECTED NOT DETECTED Final    Comment: Performed at [facility], [address] [place], [place] [address]  MRSA PCR Screening     Status: None   Collection Time: 02/19/18 12:36 PM  Result Value Ref Range Status   MRSA by PCR NEGATIVE NEGATIVE Final    Comment:        The GeneXpert MRSA Assay (FDA approved for NASAL specimens only), is one component of a comprehensive MRSA colonization surveillance program. It is not intended to diagnose MRSA infection nor to guide or monitor treatment for MRSA infections. Performed at [facility], [address] [place], [place] [address]   CULTURE, BLOOD (ROUTINE X 2) w Reflex to ID Panel     Status: None   Collection Time: 02/20/18 11:27 PM  Result Value Ref  Range Status   Specimen Description BLOOD RIGHT HAND  Final   Special Requests   Final    BOTTLES DRAWN AEROBIC AND ANAEROBIC Blood Culture results may not be optimal due to an excessive volume of blood received in culture bottles   Culture   Final    NO GROWTH 5 DAYS Performed at [facility], [address] [place], [place] [address]    Report Status 02/25/2018 FINAL  Final  CULTURE, BLOOD (ROUTINE X 2) w Reflex to ID Panel     Status: None   Collection Time: 02/20/18 11:39 PM  Result Value Ref Range Status   Specimen Description BLOOD RIGHT ANTECUBITAL  Final   Special Requests   Final    BOTTLES DRAWN  AEROBIC AND ANAEROBIC Blood Culture results may not be optimal due to an excessive volume of blood received in culture bottles   Culture   Final    NO GROWTH 5 DAYS Performed at [facility], [address] [place], [place] [address]    Report Status 02/25/2018 FINAL  Final    Coagulation Studies: No results for input(s): LABPROT, INR in the last 72 hours.  Urinalysis: No results for input(s): COLORURINE, LABSPEC, PHURINE, GLUCOSEU, HGBUR, BILIRUBINUR, KETONESUR, PROTEINUR, UROBILINOGEN, NITRITE, LEUKOCYTESUR in the last 72 hours.  Invalid input(s): APPERANCEUR    Imaging: No results found.   Medications:   . aspirin EC  81 mg Oral Daily  . atorvastatin  80 mg Oral q1800  . calcium acetate  1,334 mg Oral TID WC  . Chlorhexidine Gluconate Cloth  6 each Topical Q0600  . clopidogrel  75 mg Oral Q breakfast  . famotidine  20 mg Oral Q48H  . feeding supplement (NEPRO CARB STEADY)  237 mL Oral BID BM  . heparin  5,000 Units Subcutaneous Q8H  . hydrALAZINE  25 mg Oral Q8H  . insulin aspart  0-5 Units Subcutaneous QHS  . insulin aspart  0-9 Units Subcutaneous TID WC  . levETIRAcetam  250 mg Oral Once per day on Tue Thu Sat  . levETIRAcetam  750 mg Oral Daily  . losartan  25 mg Oral Daily  . metoprolol succinate  12.5 mg Oral Q T,Th,Sat-1800  .  metoprolol succinate  25 mg Oral Q M,W,F,Su-1800  . multivitamin  1 tablet Oral QHS  . sodium chloride flush  3 mL Intravenous Q12H  . vitamin C  250 mg Oral BID      Assessment/ Plan:  67 y.o. 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   TTS [facility] [address]/70 kg/  1. ESRD -Completed hemodialysis yesterday.  No acute indication for dialysis today.  We will plan for dialysis again tomorrow if the patient is still here.  2. AOCKD -Hemoglobin up to 11.2.  Hold off on Epogen.  3. SHPTH -Phosphorus currently 2.8 and acceptable.  Continue to monitor.  4. Altered mental status/right basal ganglia infarct -It appears patient will be transition to a nursing home.  5.  Swelling around AV graft Improving.     LOS: 8 Coleby Yett 9/13/20199:32 [facility], [place]  Note: This note was prepared with Dragon dictation. Any transcription errors are unintentional

## 2018-02-27 NOTE — Care Management (Addendum)
Patient to discharge to [facility].  CSW facilitating.  Elvera Bicker dialysis liaison notified of discharge.  Joelene Millin with [facility] notified of disposition.

## 2018-02-27 NOTE — Discharge Summary (Signed)
[place] at [place] NAME: [name]    MR#:  [id number]  DATE OF BIRTH:  Jan 16, 1951  DATE OF ADMISSION:  02/19/2018   ADMITTING PHYSICIAN: Gorden Harms, MD  DATE OF DISCHARGE: 02/27/2018 PRIMARY CARE PHYSICIAN: [name], MD   ADMISSION DIAGNOSIS:  Generalized weakness [R53.1] Altered mental status, unspecified altered mental status type [R41.82] DISCHARGE DIAGNOSIS:  Active Problems:   Acute encephalopathy   CVA (cerebral vascular accident) [facility])   Palliative care by specialist  SECONDARY DIAGNOSIS:   Past Medical History:  Diagnosis Date  . Chronic diastolic congestive heart failure ([place])   . Chronic disease anemia   . ESRD (end stage renal disease) on dialysis ([place])    "[facility] [place]; [name]" (09/29/2014)  . GERD (gastroesophageal reflux disease)   . Gout   . High cholesterol   . History of blood transfusion    "related to anemia"  . History of stomach ulcers   . Hypertension   . Type II diabetes mellitus [facility])    HOSPITAL COURSE:  *Acute ischemic right basal ganglia infarct with encephalopathy Delayed presentation-patient was encephalopathic for approximately 2 weeks before [name] for evaluation from outpatient dialysis On stroke protocol, neurology input greatly appreciated, aspirin, statin therapy.  Echocardiogram: LV EF: 30% - 35%, carotid Dopplers is unremarkable.  Aspiration/fall/skin care precautions while in house, and continue close medical monitoring. Continue aspirin 81 mg/day and Plavix 75 mg per Dr. Doy Mince. SNF placement.  *Acute gram-positive cocci blood infection He was on IV vancomycin, repeated blood cultures are negative so far.  Discontinued vancomycin if repeated blood culture is negative per ID Dr. Steva Ready.  *Acute probable left community-acquired pneumonia Completed Rocephin/azithromycin.  *ChronicESRD on HD TTS Continue hemodialysis.  The patient needs to  sit on chair to have hemodialysis for skilled nursing facility placement.  *History of peptic ulcer disease PPI daily  *Acute accelerated hypertension Continue Lopressor, and hydralazine p.o. and IV as needed. Controlled.  *History of seizure disorder Continue Keppra, seizure precautions  Hypoglycemia.  D50 IV and monitor blood glucose.  Improved.  Diabetes 2.  Changed to sensitive sliding scale.  Acute metabolic encephalopathy and possible underlying dementia. Per his wife, the patient has confusion at baseline, but worse this time. Aspiration fall precaution.  Tachycardia and bradycardia.  Follow-up Dr. Alveria Apley recommendation. DISCHARGE CONDITIONS:  Stable, discharge to SNF today. CONSULTS OBTAINED:  Treatment Team:  Murlean Iba, MD Alexis Goodell, MD Corey Skains, MD DRUG ALLERGIES:   Allergies  Allergen Reactions  . Ivp Dye [Iodinated Diagnostic Agents] Other (See Comments)    Pt denied   DISCHARGE MEDICATIONS:   Allergies as of 02/27/2018      Reactions   Ivp Dye [iodinated Diagnostic Agents] Other (See Comments)   Pt denied      Medication List    STOP taking these medications   mirtazapine 15 MG tablet Commonly known as:  REMERON     TAKE these medications   acetaminophen 325 MG tablet Commonly known as:  TYLENOL Take 2 tablets (650 mg total) by mouth every 6 (six) hours as needed for mild pain (or Fever >/= 101).   allopurinol 100 MG tablet Commonly known as:  ZYLOPRIM Take 100 mg by mouth daily.   ascorbic acid 250 MG tablet Commonly known as:  VITAMIN C Take 1 tablet (250 mg total) by mouth 2 (two) times daily.   aspirin 81 MG EC tablet Take 81 mg by  mouth daily.   atorvastatin 40 MG tablet Commonly known as:  LIPITOR Take 2 tablets (80 mg total) by mouth daily at 6 PM.   calcium acetate 667 MG capsule Commonly known as:  PHOSLO Take 1,334 mg by mouth 3 (three) times daily with meals.   clopidogrel 75 MG  tablet Commonly known as:  PLAVIX Take 1 tablet (75 mg total) by mouth daily with breakfast.   feeding supplement (NEPRO CARB STEADY) Liqd Take 237 mLs by mouth 2 (two) times daily between meals.   heparin 100-0.45 UNIT/ML-% infusion Inject 1,000 Units/hr into the vein continuous.   hydrALAZINE 25 MG tablet Commonly known as:  APRESOLINE Take 1 tablet (25 mg total) by mouth every 8 (eight) hours.   levETIRAcetam 250 MG tablet Commonly known as:  KEPPRA Take on the Days on Dialysis (Tues, Thurs, Sat) after Dialysis. What changed:    how much to take  how to take this  when to take this  additional instructions   levETIRAcetam 1000 MG tablet Commonly known as:  KEPPRA Take 1 tablet (1,000 mg total) by mouth daily. What changed:  how much to take   losartan 25 MG tablet Commonly known as:  COZAAR Take 1 tablet (25 mg total) by mouth daily.   metoprolol succinate 25 MG 24 hr tablet Commonly known as:  TOPROL-XL Take 12.5-25 mg by mouth daily. Take 25 mg by mouth on non-dialysis days and 12.5 mg on dialysis days (Tuesday, Thursday and Saturday) after dialysis.   multivitamin Tabs tablet Take 1 tablet by mouth at bedtime.   nitroGLYCERIN 0.4 MG SL tablet Commonly known as:  NITROSTAT Place 1 tablet (0.4 mg total) under the tongue every 5 (five) minutes x 3 doses as needed for chest pain.        DISCHARGE INSTRUCTIONS:  See AVS.   If you experience worsening of your admission symptoms, develop shortness of breath, life threatening emergency, suicidal or homicidal thoughts you must seek medical attention immediately by calling 911 or calling your MD immediately  if symptoms less severe.  You Must read complete instructions/literature along with all the possible adverse reactions/side effects for all the Medicines you take and that have been prescribed to you. Take any new Medicines after you have completely understood and accpet all the possible adverse reactions/side  effects.   Please note  You were cared for by a hospitalist during your hospital stay. If you have any questions about your discharge medications or the care you received while you were in the hospital after you are discharged, you can call the unit and asked to speak with the hospitalist on call if the hospitalist that took care of you is not available. Once you are discharged, your primary care physician will handle any further medical issues. Please note that NO REFILLS for any discharge medications will be authorized once you are discharged, as it is imperative that you return to your primary care physician (or establish a relationship with a primary care physician if you do not have one) for your aftercare needs so that they can reassess your need for medications and monitor your lab values.    On the day of Discharge:  VITAL SIGNS:  Blood pressure (!) 145/65, pulse 80, temperature 97.6 F (36.4 C), temperature source Oral, resp. rate 20, height 5\' 5"  (1.651 m), weight 70.4 kg, SpO2 98 %. PHYSICAL EXAMINATION:  GENERAL:  67 y.o.-year-old patient lying in the bed with no acute distress.  EYES: Pupils equal, round,  reactive to light and accommodation. No scleral icterus. Extraocular muscles intact.  HEENT: Head atraumatic, normocephalic. NECK:  Supple, no jugular venous distention. No thyroid enlargement, no tenderness.  LUNGS: Normal breath sounds bilaterally, no wheezing, rales,rhonchi or crepitation. No use of accessory muscles of respiration.  CARDIOVASCULAR: S1, S2 normal. No murmurs, rubs, or gallops.  ABDOMEN: Soft, non-tender, non-distended. Bowel sounds present. No organomegaly or mass.  EXTREMITIES: No pedal edema, cyanosis, or clubbing.  NEUROLOGIC: Unable to exam.  PSYCHIATRIC: The patient is demented. SKIN: No obvious rash, lesion, or ulcer.  DATA REVIEW:   CBC Recent Labs  Lab 02/27/18 0455  WBC 6.5  HGB 11.2*  HCT 35.0*  PLT 201    Chemistries  Recent Labs  Lab  02/24/18 1427  NA 141  K 3.9  CL 103  CO2 28  GLUCOSE 83  BUN 25*  CREATININE 12.49*  CALCIUM 9.0     Microbiology Results  Results for orders placed or performed during the hospital encounter of 02/19/18  Culture, blood (routine x 2)     Status: None   Collection Time: 02/19/18  9:17 AM  Result Value Ref Range Status   Specimen Description BLOOD RIGHT ARM  Final   Special Requests   Final    BOTTLES DRAWN AEROBIC AND ANAEROBIC Blood Culture adequate volume   Culture   Final    NO GROWTH 5 DAYS Performed at [facility], [address], [place], [place] [address]    Report Status 02/24/2018 FINAL  Final  Culture, blood (routine x 2)     Status: Abnormal   Collection Time: 02/19/18  9:48 AM  Result Value Ref Range Status   Specimen Description   Final    BLOOD RIGHT FINGER Performed at [facility], [address] [place], [place] [address]    Special Requests   Final    BOTTLES DRAWN AEROBIC AND ANAEROBIC Blood Culture adequate volume Performed at [facility], [address] [place], [place] [address]    Culture  Setup Time   Final    IN BOTH AEROBIC AND ANAEROBIC BOTTLES GRAM POSITIVE COCCI IN CLUSTERS GRAM POSITIVE [place]ODS CRITICAL RESULT CALLED TO, READ BACK BY AND VERIFIED WITH: C/MATT MCBANE @0220  02/20/18 [facility]    Culture (A)  Final    STAPHYLOCOCCUS SPECIES (COAGULASE NEGATIVE) THE SIGNIFICANCE OF ISOLATING THIS ORGANISM FROM A SINGLE SET OF BLOOD CULTURES WHEN MULTIPLE SETS ARE DRAWN IS UNCERTAIN. PLEASE NOTIFY THE MICROBIOLOGY DEPARTMENT WITHIN ONE WEEK IF SPECIATION AND SENSITIVITIES ARE REQUIRED. BACILLUS SPECIES Standardized susceptibility testing for this organism is not available. Performed at [facility], [place] [address] [place], [place] [address]    Report Status 02/22/2018 FINAL  Final  Blood Culture ID Panel (Reflexed)     Status: Abnormal   Collection Time: 02/19/18  9:48 AM  Result Value Ref Range Status    Enterococcus species NOT DETECTED NOT DETECTED Final   Listeria monocytogenes NOT DETECTED NOT DETECTED Final   Staphylococcus species DETECTED (A) NOT DETECTED Final    Comment: Methicillin (oxacillin) susceptible coagulase negative staphylococcus. Possible blood culture contaminant (unless isolated from more than one blood culture draw or clinical case suggests pathogenicity). No antibiotic treatment is indicated for blood  culture contaminants. CRITICAL RESULT CALLED TO, READ BACK BY AND VERIFIED WITH: C/MATT MCBANE @0220  02/20/18 [facility]    Staphylococcus aureus NOT DETECTED NOT DETECTED Final   Methicillin resistance NOT DETECTED NOT DETECTED Final   Streptococcus species NOT DETECTED NOT  DETECTED Final   Streptococcus agalactiae NOT DETECTED NOT DETECTED Final   Streptococcus pneumoniae NOT DETECTED NOT DETECTED Final   Streptococcus pyogenes NOT DETECTED NOT DETECTED Final   Acinetobacter baumannii NOT DETECTED NOT DETECTED Final   Enterobacteriaceae species NOT DETECTED NOT DETECTED Final   Enterobacter cloacae complex NOT DETECTED NOT DETECTED Final   Escherichia coli NOT DETECTED NOT DETECTED Final   Klebsiella oxytoca NOT DETECTED NOT DETECTED Final   Klebsiella pneumoniae NOT DETECTED NOT DETECTED Final   Proteus species NOT DETECTED NOT DETECTED Final   Serratia marcescens NOT DETECTED NOT DETECTED Final   Haemophilus influenzae NOT DETECTED NOT DETECTED Final   Neisseria meningitidis NOT DETECTED NOT DETECTED Final   Pseudomonas aeruginosa NOT DETECTED NOT DETECTED Final   Candida albicans NOT DETECTED NOT DETECTED Final   Candida glabrata NOT DETECTED NOT DETECTED Final   Candida krusei NOT DETECTED NOT DETECTED Final   Candida parapsilosis NOT DETECTED NOT DETECTED Final   Candida tropicalis NOT DETECTED NOT DETECTED Final    Comment: Performed at [facility], [address] [place], [place] [address]  MRSA PCR Screening     Status: None   Collection Time:  02/19/18 12:36 PM  Result Value Ref Range Status   MRSA by PCR NEGATIVE NEGATIVE Final    Comment:        The GeneXpert MRSA Assay (FDA approved for NASAL specimens only), is one component of a comprehensive MRSA colonization surveillance program. It is not intended to diagnose MRSA infection nor to guide or monitor treatment for MRSA infections. Performed at [facility], [address] [place], [place] [address]   CULTURE, BLOOD (ROUTINE X 2) w Reflex to ID Panel     Status: None   Collection Time: 02/20/18 11:27 PM  Result Value Ref Range Status   Specimen Description BLOOD RIGHT HAND  Final   Special Requests   Final    BOTTLES DRAWN AEROBIC AND ANAEROBIC Blood Culture results may not be optimal due to an excessive volume of blood received in culture bottles   Culture   Final    NO GROWTH 5 DAYS Performed at [facility], [address] [place], [place] [address]    Report Status 02/25/2018 FINAL  Final  CULTURE, BLOOD (ROUTINE X 2) w Reflex to ID Panel     Status: None   Collection Time: 02/20/18 11:39 PM  Result Value Ref Range Status   Specimen Description BLOOD RIGHT ANTECUBITAL  Final   Special Requests   Final    BOTTLES DRAWN AEROBIC AND ANAEROBIC Blood Culture results may not be optimal due to an excessive volume of blood received in culture bottles   Culture   Final    NO GROWTH 5 DAYS Performed at [facility], [address], [place], [place] [address]    Report Status 02/25/2018 FINAL  Final    RADIOLOGY:  No results found.   Management plans discussed with the patient, family and they are in agreement.  CODE STATUS: DNR   TOTAL TIME TAKING CARE OF THIS PATIENT: 35 minutes.    Demetrios Loll M.D on 02/27/2018 at 11:36 AM  Between 7am to 6pm - Pager - [phone]  After 6pm go to [email or web address] - Proofreader  [employer] [place] [facility]  Office  [phone]  CC: Primary care physician; [name], MD   Note: This dictation was prepared with Dragon dictation along with smaller phrase technology. Any transcriptional errors that result  from this process are unintentional.

## 2018-02-27 NOTE — Clinical Social Work Placement (Signed)
   CLINICAL SOCIAL WORK PLACEMENT  NOTE  Date:  02/27/2018  Patient Details  Name: [name] MRN: [id number] Date of Birth: 06-Dec-1950  Clinical Social Work is seeking post-discharge placement for this patient at the [place] level of care (*CSW will initial, date and re-position this form in  chart as items are completed):  Yes   Patient/family provided with [facility] Work Department's list of facilities offering this level of care within the geographic area requested by the patient (or if unable, by the patient's family).  Yes   Patient/family informed of their freedom to choose among providers that offer the needed level of care, that participate in Medicare, Medicaid or managed care program needed by the patient, have an available bed and are willing to accept the patient.  Yes   Patient/family informed of [facility]'s ownership interest in [facility] and [facility], as well as of the fact that they are under no obligation to receive care at these facilities.  PASRR submitted to EDS on       PASRR number received on       Existing PASRR number confirmed on 02/25/18     FL2 transmitted to all facilities in geographic area requested by pt/family on 02/25/18     FL2 transmitted to all facilities within larger geographic area on       Patient informed that his/her managed care company has contracts with or will negotiate with certain facilities, including the following:        Yes   Patient/family informed of bed offers received.  Patient chooses bed at ([facility] )     Physician recommends and patient chooses bed at      Patient to be transferred to ([facility] ) on 02/27/18.  Patient to be transferred to facility by [facility] )     Patient family notified on 02/27/18 of transfer.  Name of family member notified:  (Patient's wife [name] is at bedside and aware of D/C today. )     PHYSICIAN       Additional Comment:     _______________________________________________ Lynn Sissel, Veronia Beets, LCSW 02/27/2018, 1:24 PM

## 2018-02-27 NOTE — Progress Notes (Signed)
I called handoff report to [place] at [facility]. Pt is stable, vitals WDL and EMS transported pt to [facility]. Family is aware that pt is being sent to [facility] and they will meet him there.

## 2018-09-29 ENCOUNTER — Emergency Department: Payer: Medicare Other

## 2018-09-29 ENCOUNTER — Emergency Department
Admission: EM | Admit: 2018-09-29 | Discharge: 2018-09-29 | Disposition: A | Discharge status: Home / Self Care | Payer: Medicare Other | Attending: Emergency Medicine | Admitting: Emergency Medicine

## 2018-09-29 ENCOUNTER — Other Ambulatory Visit: Payer: Self-pay

## 2018-09-29 DIAGNOSIS — Z8673 Personal history of transient ischemic attack (TIA), and cerebral infarction without residual deficits: Secondary | ICD-10-CM | POA: Insufficient documentation

## 2018-09-29 DIAGNOSIS — I252 Old myocardial infarction: Secondary | ICD-10-CM | POA: Insufficient documentation

## 2018-09-29 DIAGNOSIS — R531 Weakness: Secondary | ICD-10-CM | POA: Insufficient documentation

## 2018-09-29 DIAGNOSIS — N186 End stage renal disease: Secondary | ICD-10-CM

## 2018-09-29 DIAGNOSIS — Z951 Presence of aortocoronary bypass graft: Secondary | ICD-10-CM | POA: Insufficient documentation

## 2018-09-29 DIAGNOSIS — I5032 Chronic diastolic (congestive) heart failure: Secondary | ICD-10-CM | POA: Insufficient documentation

## 2018-09-29 DIAGNOSIS — Z87891 Personal history of nicotine dependence: Secondary | ICD-10-CM | POA: Insufficient documentation

## 2018-09-29 DIAGNOSIS — Z992 Dependence on renal dialysis: Secondary | ICD-10-CM | POA: Diagnosis not present

## 2018-09-29 DIAGNOSIS — E1122 Type 2 diabetes mellitus with diabetic chronic kidney disease: Secondary | ICD-10-CM | POA: Diagnosis not present

## 2018-09-29 DIAGNOSIS — I132 Hypertensive heart and chronic kidney disease with heart failure and with stage 5 chronic kidney disease, or end stage renal disease: Secondary | ICD-10-CM | POA: Insufficient documentation

## 2018-09-29 LAB — TROPONIN I: Troponin I: 0.03 ng/mL (ref <0.03)

## 2018-09-29 LAB — CBC WITH DIFFERENTIAL/PLATELET
Abs Immature Granulocytes: 0.06 10*3/uL (ref 0.00–0.07)
Basophils Absolute: 0 10*3/uL (ref 0.0–0.1)
Basophils Relative: 1 %
Eosinophils Absolute: 0.4 10*3/uL (ref 0.0–0.5)
Eosinophils Relative: 6 %
HCT: 41.6 % (ref 39.0–52.0)
Hemoglobin: 12.8 g/dL — ABNORMAL LOW (ref 13.0–17.0)
Immature Granulocytes: 1 %
Lymphocytes Relative: 17 %
Lymphs Abs: 1.2 10*3/uL (ref 0.7–4.0)
MCH: 23.2 pg — ABNORMAL LOW (ref 26.0–34.0)
MCHC: 30.8 g/dL (ref 30.0–36.0)
MCV: 75.5 fL — ABNORMAL LOW (ref 80.0–100.0)
Monocytes Absolute: 0.4 10*3/uL (ref 0.1–1.0)
Monocytes Relative: 5 %
Neutro Abs: 5.1 10*3/uL (ref 1.7–7.7)
Neutrophils Relative %: 70 %
Platelets: 167 10*3/uL (ref 150–400)
RBC: 5.51 MIL/uL (ref 4.22–5.81)
RDW: 16.1 % — ABNORMAL HIGH (ref 11.5–15.5)
WBC: 7.2 10*3/uL (ref 4.0–10.5)
nRBC: 0 % (ref 0.0–0.2)

## 2018-09-29 LAB — COMPREHENSIVE METABOLIC PANEL
ALT: 14 U/L (ref 0–44)
AST: 20 U/L (ref 15–41)
Albumin: 3.8 g/dL (ref 3.5–5.0)
Alkaline Phosphatase: 86 U/L (ref 38–126)
Anion gap: 14 (ref 5–15)
BUN: 36 mg/dL — ABNORMAL HIGH (ref 8–23)
CO2: 28 mmol/L (ref 22–32)
Calcium: 8.4 mg/dL — ABNORMAL LOW (ref 8.9–10.3)
Chloride: 96 mmol/L — ABNORMAL LOW (ref 98–111)
Creatinine, Ser: 7.86 mg/dL — ABNORMAL HIGH (ref 0.61–1.24)
GFR calc Af Amer: 7 mL/min — ABNORMAL LOW (ref >60)
GFR calc non Af Amer: 6 mL/min — ABNORMAL LOW (ref >60)
Glucose, Bld: 102 mg/dL — ABNORMAL HIGH (ref 70–99)
Potassium: 4.1 mmol/L (ref 3.5–5.1)
Sodium: 138 mmol/L (ref 135–145)
Total Bilirubin: 1 mg/dL (ref 0.3–1.2)
Total Protein: 8 g/dL (ref 6.5–8.1)

## 2018-09-29 LAB — GLUCOSE, CAPILLARY: Glucose-Capillary: 106 mg/dL — ABNORMAL HIGH (ref 70–99)

## 2018-09-29 NOTE — ED Notes (Signed)
Pt sluggish and has a hard time answering questions

## 2018-09-29 NOTE — ED Notes (Signed)
Attempted Iv- difficult stick- is a dialysis pt

## 2018-09-29 NOTE — ED Notes (Signed)
Pt's son called to pick him up

## 2018-09-29 NOTE — ED Notes (Signed)
Pt removed all monitoring equipment and was yelling out - when ask what he needed he states that he is ready to go home and he wants his mom called to come and get him - reconnected all the monitoring equipment and explained to pt that he was not ready to leave yet

## 2018-09-29 NOTE — ED Notes (Signed)
Daughter- [name] [phone]) updated on pt condition and [phone]

## 2018-09-29 NOTE — ED Triage Notes (Addendum)
Pt arrived via EMS from dialysis after having an episode of lethargy and a low BP- EMS stated that the dialysis dr did a sternal rub to wake up the pt- BP for EMS was 104/68- pt's fistula still accessed

## 2018-09-29 NOTE — ED Provider Notes (Signed)
[facility] Emergency Department Provider Note       Time seen: ----------------------------------------- 9:58 AM on 09/29/2018 -----------------------------------------   I have reviewed the triage vital signs and the nursing notes.  HISTORY   Chief Complaint No chief complaint on file.    HPI [name] is a 68 y.o. male with a history of CHF, anemia, end-stage renal disease on dialysis, GERD, hyperlipidemia, diabetes who presents to the ED for weakness and lethargy.  Patient finished all with the last 30 minutes of his dialysis today.  He was reportedly lethargic and had low blood pressure.  EMS stated that the dialysis doctor did a sternal rub to wake the patient up.  Blood pressure was 104/68 on EMS arrival.  Patient complaining of weakness, denies any acute complaints.  Past Medical History:  Diagnosis Date  . Chronic diastolic congestive heart failure ([place])   . Chronic disease anemia   . ESRD (end stage renal disease) on dialysis ([place])    "[place]; [place]; [name]" (09/29/2014)  . GERD (gastroesophageal reflux disease)   . Gout   . High cholesterol   . History of blood transfusion    "related to anemia"  . History of stomach ulcers   . Hypertension   . Type II diabetes mellitus [facility])     Patient Active Problem List   Diagnosis Date Noted  . CVA (cerebral vascular accident) ([place])   . Palliative care by specialist   . Acute encephalopathy 02/19/2018  . Calculus of common duct   . Fitting and adjustment of gastrointestinal appliance and device   . Elevated LFTs 09/16/2016  . Calculus of bile duct without cholecystitis with obstruction   . Choledocholithiasis   . Diabetes ([place]) 09/04/2016  . HTN (hypertension) 09/04/2016  . GERD (gastroesophageal reflux disease) 09/04/2016  . Chronic diastolic CHF (congestive heart failure) ([place]) 09/04/2016  . Transaminitis 09/04/2016  . Sepsis ([place]) 09/04/2016  . CAP (community acquired pneumonia)  09/04/2016  . Encephalopathy acute   . HCAP (healthcare-associated pneumonia) 07/24/2016  . Bradycardia 06/25/2016  . Alteration in self-care ability 03/05/2016  . Hx of subdural hematoma 03/05/2016  . 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 NSTEMI (non-ST elevated myocardial infarction) ([place]) 02/26/2016  . H/O non-ST elevation myocardial infarction (NSTEMI) 01/10/2016  . S/P coronary artery stent placement 01/10/2016  . Depression, major, single episode, complete remission ([place]) 07/22/2015  . Acute respiratory failure with hypoxia ([place]) 02/14/2015  . Syncope 11/05/2014  . Acute cystitis with hematuria 10/11/2014  . Acute subdural hematoma ([place]) 10/10/2014  . Anemia in chronic kidney disease (CKD) 10/07/2014  . ESRD on hemodialysis ([place]) 10/07/2014  . Mixed hyperlipidemia 10/07/2014  . 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 Contaminant given to patient   . Shortness of breath   . Viridans streptococci infection   . Polymicrobial bacterial infection   . Bacteremia   . 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 ESRD on dialysis ([place])   . Type 1 diabetes mellitus with other diabetic kidney complication ([place])   . Acute coronary syndrome ([place]) 09/29/2014  . Anemia associated with chronic renal failure 03/25/2014  . Nausea & vomiting 03/25/2014  . Gout 03/17/2012  . H/O: upper GI bleed 03/17/2012  . Hyperlipidemia, unspecified 03/17/2012  . Kidney transplant status, cadaveric 07/16/2002    Past Surgical History:  Procedure Laterality Date  . ARTERIOVENOUS GRAFT PLACEMENT Left ~ 1996  . CARDIAC CATHETERIZATION  09/29/2014   ""  . CARDIAC CATHETERIZATION N/A 02/26/2016  Procedure: Left Heart Cath and Coronary Angiography Possible PCI;  Surgeon: [name], MD;  Location: [place] CV LAB;  Service: Cardiovascular;  Laterality: N/A;  . ENDOSCOPIC RETROGRADE CHOLANGIOPANCREATOGRAPHY (ERCP) WITH PROPOFOL N/A 09/17/2016   Procedure: ENDOSCOPIC  RETROGRADE CHOLANGIOPANCREATOGRAPHY (ERCP) WITH PROPOFOL;  Surgeon: [name], MD;  Location: [facility] ENDOSCOPY;  Service: Endoscopy;  Laterality: N/A;  . ERCP N/A 09/10/2016   Procedure: ENDOSCOPIC RETROGRADE CHOLANGIOPANCREATOGRAPHY (ERCP);  Surgeon: [name], MD;  Location: [facility] ENDOSCOPY;  Service: Endoscopy;  Laterality: N/A;  . KIDNEY TRANSPLANT Right 2004  . NEPHRECTOMY TRANSPLANTED ORGAN  2015  . PERCUTANEOUS CORONARY STENT INTERVENTION (PCI-S) N/A 09/30/2014   Procedure: PERCUTANEOUS CORONARY STENT INTERVENTION (PCI-S);  Surgeon: [name], MD;  Location: [facility] CATH LAB;  Service: Cardiovascular;  Laterality: N/A;  . PERITONEAL CATHETER INSERTION  02/2014  . PERITONEAL CATHETER REMOVAL  08/2014  . THROMBECTOMY / ARTERIOVENOUS GRAFT REVISION  2015    Allergies Ivp dye [iodinated diagnostic agents]  Social History Social History   Tobacco Use  . Smoking status: Former Smoker    Types: Cigarettes  . Smokeless tobacco: Never Used  . Tobacco comment: "quit smoking cigarettes in the 1980's"  Substance Use Topics  . Alcohol use: No  . Drug use: No   Review of Systems Constitutional: Negative for fever. Cardiovascular: Negative for chest pain. Respiratory: Negative for shortness of breath. Gastrointestinal: Negative for abdominal pain, vomiting and diarrhea. Musculoskeletal: Negative for back pain. Skin: Negative for rash. Neurological: Positive for generalized weakness  All systems negative/normal/unremarkable except as stated in the HPI  ____________________________________________   PHYSICAL EXAM:  VITAL SIGNS: ED Triage Vitals  Enc Vitals Group     BP      Pulse      Resp      Temp      Temp src      SpO2      Weight      Height      Head Circumference      [facility] Flow      Pain Score      Pain Loc      Pain Edu?      Excl. in [place]?    Constitutional: Lethargic but oriented, no distress Eyes: Conjunctivae are normal. Normal extraocular movements. ENT       Head: Normocephalic and atraumatic.      Nose: No congestion/rhinnorhea.      Mouth/Throat: Mucous membranes are moist.      Neck: No stridor. Cardiovascular: Normal rate, regular rhythm. No murmurs, rubs, or gallops.  Left arm AV fistula is accessed Respiratory: Normal respiratory effort without tachypnea nor retractions. Breath sounds are clear and equal bilaterally. No wheezes/rales/rhonchi. Gastrointestinal: Soft and nontender. Normal bowel sounds Musculoskeletal: Nontender with normal range of motion in extremities. No lower extremity tenderness nor edema. Neurologic:  Normal speech and language. No gross focal neurologic deficits are appreciated.  Generalized weakness, nothing focal Skin:  Skin is warm, dry and intact. No rash noted. Psychiatric: Flat affect ____________________________________________  EKG: Interpreted by me.  Sinus rhythm the rate of 67 bpm, normal PR interval, normal QRS, normal QT  ____________________________________________  ED COURSE:  As part of my medical decision making, I reviewed the following data within the [place] History obtained from family if available, nursing notes, old chart and ekg, as well as notes from prior ED visits. Patient presented for weakness after dialysis, we will assess with labs and imaging as indicated at this time.  Procedures  Nihal Marzella was evaluated in Emergency Department on 09/29/2018 for the symptoms described in the history of present illness. He was evaluated in the context of the global COVID-19 pandemic, which necessitated consideration that the patient might be at risk for infection with the SARS-CoV-2 virus that causes COVID-19. Institutional protocols and algorithms that pertain to the evaluation of patients at risk for COVID-19 are in a state of rapid change based on information released by regulatory bodies including the CDC and federal and state organizations. These policies and algorithms were  followed during the patient's care in the ED.  ____________________________________________   LABS (pertinent positives/negatives)  Labs Reviewed  CBC WITH DIFFERENTIAL/PLATELET - Abnormal; Notable for the following components:      Result Value   Hemoglobin 12.8 (*)    MCV 75.5 (*)    MCH 23.2 (*)    RDW 16.1 (*)    All other components within normal limits  GLUCOSE, CAPILLARY - Abnormal; Notable for the following components:   Glucose-Capillary 106 (*)    All other components within normal limits  COMPREHENSIVE METABOLIC PANEL - Abnormal; Notable for the following components:   Chloride 96 (*)    Glucose, Bld 102 (*)    BUN 36 (*)    Creatinine, Ser 7.86 (*)    Calcium 8.4 (*)    GFR calc non Af Amer 6 (*)    GFR calc Af Amer 7 (*)    All other components within normal limits  TROPONIN I - Abnormal; Notable for the following components:   Troponin I 0.03 (*)    All other components within normal limits  CBG MONITORING, ED    RADIOLOGY Images were viewed by me  Chest x-ray IMPRESSION: Opacity at the left lung base, potentially consolidation or atelectasis.  Surgical changes of median sternotomy.  ____________________________________________   DIFFERENTIAL DIAGNOSIS   Transient hypotension secondary to dialysis, electrolyte abnormality, arrhythmia, sepsis, MI  FINAL ASSESSMENT AND PLAN  Weakness, end-stage renal disease on dialysis   Plan: The patient had presented for weakness during dialysis. Patient's labs are as expected. Patient's imaging revealed a possible left basilar opacity which I do not think is pneumonia.  Patient denies any symptoms of pneumonia or cough or shortness of breath.  He is cleared for outpatient follow-up.   Laurence Aly, MD    Note: This note was generated in part or whole with voice recognition software. Voice recognition is usually quite accurate but there are transcription errors that can and very often do occur. I  apologize for any typographical errors that were not detected and corrected.     Earleen Newport, MD 09/29/18 [phone]

## 2018-09-29 NOTE — ED Notes (Signed)
Dr Jimmye Norman notified of Troponin 0.03- no new orders at this time

## 2018-09-29 NOTE — ED Notes (Signed)
Called pt's wife to inquire about living situation- she stated the address was [place]

## 2018-09-29 NOTE — ED Notes (Signed)
Lab called and said green top had hemolysed again and are sending someone to [facility]

## 2018-09-29 NOTE — ED Notes (Signed)
Lab called and stated the green top had hemolysed- asked to send a phlebotomist to redraw d/t hard stick

## 2018-09-29 NOTE — ED Notes (Signed)
Dialysis nurse at bedside to deaccess fistula

## 2018-09-29 NOTE — ED Notes (Signed)
Called dialysis to deaccess pt's fistula- was informed they would come when another team member arrived

## 2018-09-29 NOTE — ED Notes (Signed)
Lab in room to draw the blood

## 2018-11-13 ENCOUNTER — Inpatient Hospital Stay
Admission: EM | Admit: 2018-11-13 | Discharge: 2018-11-15 | DRG: 193 | Disposition: A | Discharge status: Home Health | Payer: Medicare Other | Attending: Specialist | Admitting: Specialist

## 2018-11-13 ENCOUNTER — Emergency Department: Payer: Medicare Other

## 2018-11-13 ENCOUNTER — Inpatient Hospital Stay: Payer: Medicare Other

## 2018-11-13 ENCOUNTER — Other Ambulatory Visit: Payer: Self-pay

## 2018-11-13 ENCOUNTER — Encounter: Payer: Self-pay | Admitting: Emergency Medicine

## 2018-11-13 DIAGNOSIS — Z7902 Long term (current) use of antithrombotics/antiplatelets: Secondary | ICD-10-CM | POA: Diagnosis not present

## 2018-11-13 DIAGNOSIS — J189 Pneumonia, unspecified organism: Secondary | ICD-10-CM | POA: Diagnosis present

## 2018-11-13 DIAGNOSIS — I2510 Atherosclerotic heart disease of native coronary artery without angina pectoris: Secondary | ICD-10-CM | POA: Diagnosis present

## 2018-11-13 DIAGNOSIS — Z951 Presence of aortocoronary bypass graft: Secondary | ICD-10-CM

## 2018-11-13 DIAGNOSIS — K219 Gastro-esophageal reflux disease without esophagitis: Secondary | ICD-10-CM | POA: Diagnosis present

## 2018-11-13 DIAGNOSIS — Z833 Family history of diabetes mellitus: Secondary | ICD-10-CM

## 2018-11-13 DIAGNOSIS — I252 Old myocardial infarction: Secondary | ICD-10-CM | POA: Diagnosis not present

## 2018-11-13 DIAGNOSIS — I132 Hypertensive heart and chronic kidney disease with heart failure and with stage 5 chronic kidney disease, or end stage renal disease: Secondary | ICD-10-CM | POA: Diagnosis present

## 2018-11-13 DIAGNOSIS — Z825 Family history of asthma and other chronic lower respiratory diseases: Secondary | ICD-10-CM | POA: Diagnosis not present

## 2018-11-13 DIAGNOSIS — R569 Unspecified convulsions: Secondary | ICD-10-CM | POA: Diagnosis present

## 2018-11-13 DIAGNOSIS — E7800 Pure hypercholesterolemia, unspecified: Secondary | ICD-10-CM | POA: Diagnosis present

## 2018-11-13 DIAGNOSIS — Z823 Family history of stroke: Secondary | ICD-10-CM

## 2018-11-13 DIAGNOSIS — Z91041 Radiographic dye allergy status: Secondary | ICD-10-CM

## 2018-11-13 DIAGNOSIS — Z955 Presence of coronary angioplasty implant and graft: Secondary | ICD-10-CM | POA: Diagnosis not present

## 2018-11-13 DIAGNOSIS — N186 End stage renal disease: Secondary | ICD-10-CM | POA: Diagnosis present

## 2018-11-13 DIAGNOSIS — Z7982 Long term (current) use of aspirin: Secondary | ICD-10-CM

## 2018-11-13 DIAGNOSIS — Z8249 Family history of ischemic heart disease and other diseases of the circulatory system: Secondary | ICD-10-CM

## 2018-11-13 DIAGNOSIS — Z8673 Personal history of transient ischemic attack (TIA), and cerebral infarction without residual deficits: Secondary | ICD-10-CM | POA: Diagnosis not present

## 2018-11-13 DIAGNOSIS — T8612 Kidney transplant failure: Secondary | ICD-10-CM | POA: Diagnosis present

## 2018-11-13 DIAGNOSIS — M109 Gout, unspecified: Secondary | ICD-10-CM | POA: Diagnosis present

## 2018-11-13 DIAGNOSIS — E785 Hyperlipidemia, unspecified: Secondary | ICD-10-CM | POA: Diagnosis present

## 2018-11-13 DIAGNOSIS — R531 Weakness: Secondary | ICD-10-CM

## 2018-11-13 DIAGNOSIS — E782 Mixed hyperlipidemia: Secondary | ICD-10-CM | POA: Diagnosis present

## 2018-11-13 DIAGNOSIS — D631 Anemia in chronic kidney disease: Secondary | ICD-10-CM | POA: Diagnosis present

## 2018-11-13 DIAGNOSIS — N2581 Secondary hyperparathyroidism of renal origin: Secondary | ICD-10-CM | POA: Diagnosis present

## 2018-11-13 DIAGNOSIS — Z992 Dependence on renal dialysis: Secondary | ICD-10-CM

## 2018-11-13 DIAGNOSIS — E1122 Type 2 diabetes mellitus with diabetic chronic kidney disease: Secondary | ICD-10-CM | POA: Diagnosis present

## 2018-11-13 DIAGNOSIS — Z87891 Personal history of nicotine dependence: Secondary | ICD-10-CM

## 2018-11-13 DIAGNOSIS — Z20828 Contact with and (suspected) exposure to other viral communicable diseases: Secondary | ICD-10-CM | POA: Diagnosis present

## 2018-11-13 DIAGNOSIS — I5032 Chronic diastolic (congestive) heart failure: Secondary | ICD-10-CM | POA: Diagnosis present

## 2018-11-13 LAB — HEPATIC FUNCTION PANEL
ALT: 12 U/L (ref 0–44)
AST: 19 U/L (ref 15–41)
Albumin: 3.7 g/dL (ref 3.5–5.0)
Alkaline Phosphatase: 104 U/L (ref 38–126)
Bilirubin, Direct: 0.1 mg/dL (ref 0.0–0.2)
Indirect Bilirubin: 0.6 mg/dL (ref 0.3–0.9)
Total Bilirubin: 0.7 mg/dL (ref 0.3–1.2)
Total Protein: 8.1 g/dL (ref 6.5–8.1)

## 2018-11-13 LAB — TROPONIN I
Troponin I: 0.03 ng/mL (ref <0.03)
Troponin I: 0.03 ng/mL (ref <0.03)
Troponin I: 0.03 ng/mL (ref <0.03)

## 2018-11-13 LAB — BASIC METABOLIC PANEL
Anion gap: 15 (ref 5–15)
BUN: 46 mg/dL — ABNORMAL HIGH (ref 8–23)
CO2: 28 mmol/L (ref 22–32)
Calcium: 8.8 mg/dL — ABNORMAL LOW (ref 8.9–10.3)
Chloride: 97 mmol/L — ABNORMAL LOW (ref 98–111)
Creatinine, Ser: 9.19 mg/dL — ABNORMAL HIGH (ref 0.61–1.24)
GFR calc Af Amer: 6 mL/min — ABNORMAL LOW (ref >60)
GFR calc non Af Amer: 5 mL/min — ABNORMAL LOW (ref >60)
Glucose, Bld: 110 mg/dL — ABNORMAL HIGH (ref 70–99)
Potassium: 4.6 mmol/L (ref 3.5–5.1)
Sodium: 140 mmol/L (ref 135–145)

## 2018-11-13 LAB — CBC WITH DIFFERENTIAL/PLATELET
Basophils Absolute: 0.1 10*3/uL (ref 0.0–0.1)
Basophils Relative: 0 %
Eosinophils Absolute: 0.6 10*3/uL — ABNORMAL HIGH (ref 0.0–0.5)
Eosinophils Relative: 5 %
HCT: 37.2 % — ABNORMAL LOW (ref 39.0–52.0)
Hemoglobin: 11.6 g/dL — ABNORMAL LOW (ref 13.0–17.0)
Lymphocytes Relative: 16 %
Lymphs Abs: 1.9 10*3/uL (ref 0.7–4.0)
MCH: 23.3 pg — ABNORMAL LOW (ref 26.0–34.0)
MCHC: 31.2 g/dL (ref 30.0–36.0)
MCV: 74.8 fL — ABNORMAL LOW (ref 80.0–100.0)
Monocytes Absolute: 0.9 10*3/uL (ref 0.1–1.0)
Monocytes Relative: 8 %
Neutro Abs: 8.2 10*3/uL — ABNORMAL HIGH (ref 1.7–7.7)
Neutrophils Relative %: 71 %
Platelets: 209 10*3/uL (ref 150–400)
RBC: 4.97 MIL/uL (ref 4.22–5.81)
RDW: 16.1 % — ABNORMAL HIGH (ref 11.5–15.5)
WBC: 11.6 10*3/uL — ABNORMAL HIGH (ref 4.0–10.5)
nRBC: 0 % (ref 0.0–0.2)

## 2018-11-13 LAB — PROCALCITONIN: Procalcitonin: 0.31 ng/mL

## 2018-11-13 LAB — BRAIN NATRIURETIC PEPTIDE: B Natriuretic Peptide: 163 pg/mL — ABNORMAL HIGH (ref 0.0–100.0)

## 2018-11-13 LAB — SARS CORONAVIRUS 2 BY RT PCR (HOSPITAL ORDER, PERFORMED IN ~~LOC~~ HOSPITAL LAB): SARS Coronavirus 2: NEGATIVE

## 2018-11-13 MED ORDER — ASPIRIN EC 81 MG PO TBEC
81.0000 mg | DELAYED_RELEASE_TABLET | Freq: Every day | ORAL | Status: DC
  Administered 2018-11-15: 81 mg via ORAL
  Filled 2018-11-13: qty 1

## 2018-11-13 MED ORDER — HEPARIN SODIUM (PORCINE) 5000 UNIT/ML IJ SOLN
5000.0000 [IU] | Freq: Three times a day (TID) | INTRAMUSCULAR | Status: DC
  Administered 2018-11-14 – 2018-11-15 (×3): 5000 [IU] via SUBCUTANEOUS
  Filled 2018-11-13 (×4): qty 1

## 2018-11-13 MED ORDER — RENA-VITE PO TABS
1.0000 | ORAL_TABLET | Freq: Every day | ORAL | Status: DC
  Administered 2018-11-14 (×2): 1 via ORAL
  Filled 2018-11-13 (×3): qty 1

## 2018-11-13 MED ORDER — SODIUM CHLORIDE 0.9 % IV SOLN
1.0000 g | INTRAVENOUS | Status: DC
  Administered 2018-11-14: 17:00:00 1 g via INTRAVENOUS
  Filled 2018-11-13: qty 1
  Filled 2018-11-13: qty 10

## 2018-11-13 MED ORDER — SODIUM CHLORIDE 0.9% FLUSH: 3.0000 mL | Freq: Once | INTRAVENOUS | Status: DC

## 2018-11-13 MED ORDER — SODIUM CHLORIDE 0.9 % IV SOLN
500.0000 mg | Freq: Once | INTRAVENOUS | Status: AC
  Administered 2018-11-13: 16:00:00 500 mg via INTRAVENOUS
  Filled 2018-11-13 (×2): qty 500

## 2018-11-13 MED ORDER — LEVETIRACETAM 750 MG PO TABS
750.0000 mg | ORAL_TABLET | Freq: Every day | ORAL | Status: DC
  Administered 2018-11-15: 750 mg via ORAL
  Filled 2018-11-13 (×3): qty 1

## 2018-11-13 MED ORDER — METOPROLOL SUCCINATE ER 25 MG PO TB24: 12.5000 mg | ORAL_TABLET | ORAL | Status: DC

## 2018-11-13 MED ORDER — CHLORHEXIDINE GLUCONATE CLOTH 2 % EX PADS
6.0000 | MEDICATED_PAD | Freq: Every day | CUTANEOUS | Status: DC
  Administered 2018-11-14 – 2018-11-15 (×2): 6 via TOPICAL

## 2018-11-13 MED ORDER — NEPRO/CARBSTEADY PO LIQD: 237.0000 mL | Freq: Two times a day (BID) | ORAL | Status: DC

## 2018-11-13 MED ORDER — CALCIUM ACETATE (PHOS BINDER) 667 MG PO CAPS
1334.0000 mg | ORAL_CAPSULE | Freq: Three times a day (TID) | ORAL | Status: DC
  Filled 2018-11-13 (×6): qty 2

## 2018-11-13 MED ORDER — ALLOPURINOL 100 MG PO TABS
100.0000 mg | ORAL_TABLET | Freq: Every day | ORAL | Status: DC
  Administered 2018-11-15: 100 mg via ORAL
  Filled 2018-11-13: qty 1

## 2018-11-13 MED ORDER — CLOPIDOGREL BISULFATE 75 MG PO TABS
75.0000 mg | ORAL_TABLET | Freq: Every day | ORAL | Status: DC
  Administered 2018-11-15: 10:00:00 75 mg via ORAL
  Filled 2018-11-13: qty 1

## 2018-11-13 MED ORDER — MIRTAZAPINE 15 MG PO TABS
15.0000 mg | ORAL_TABLET | Freq: Every day | ORAL | Status: DC
  Administered 2018-11-14 (×2): 15 mg via ORAL
  Filled 2018-11-13 (×2): qty 1

## 2018-11-13 MED ORDER — SODIUM CHLORIDE 0.9 % IV SOLN
1.0000 g | Freq: Once | INTRAVENOUS | Status: AC
  Administered 2018-11-13: 15:00:00 1 g via INTRAVENOUS
  Filled 2018-11-13 (×2): qty 10

## 2018-11-13 MED ORDER — ATORVASTATIN CALCIUM 20 MG PO TABS
80.0000 mg | ORAL_TABLET | Freq: Every day | ORAL | Status: DC
  Administered 2018-11-15: 10:00:00 80 mg via ORAL
  Filled 2018-11-13: qty 4

## 2018-11-13 MED ORDER — SODIUM CHLORIDE 0.9 % IV SOLN
500.0000 mg | INTRAVENOUS | Status: DC
  Administered 2018-11-14: 500 mg via INTRAVENOUS
  Filled 2018-11-13 (×2): qty 500

## 2018-11-13 MED ORDER — LOSARTAN POTASSIUM 25 MG PO TABS
25.0000 mg | ORAL_TABLET | Freq: Every day | ORAL | Status: DC
  Administered 2018-11-15: 10:00:00 25 mg via ORAL
  Filled 2018-11-13: qty 1

## 2018-11-13 NOTE — Progress Notes (Signed)
Post HD Assessment     11/13/18 2330  Neurological  Level of Consciousness Alert  Orientation Level Oriented to person;Oriented to place;Oriented to time;Disoriented to situation  Respiratory  Respiratory Pattern Regular  Chest Assessment Chest expansion symmetrical  Bilateral Breath Sounds Coarse crackles  Cough Non-productive  Cardiac  Pulse Regular  Heart Sounds S1, S2  ECG Monitor Yes  Vascular  R Radial Pulse +2  L Radial Pulse +2  Edema Right lower extremity;Left lower extremity  RLE Edema +1  LLE Edema +1  Integumentary  Integumentary (WDL) X  Skin Color Appropriate for ethnicity  Skin Condition Dry  Skin Integrity  (HD Pt)  Musculoskeletal  Musculoskeletal (WDL) X  Generalized Weakness Yes  Gastrointestinal  Bowel Sounds Assessment Active  GU Assessment  Genitourinary (WDL) X  Genitourinary Symptoms Anuria (HD pt 6+ years)  Psychosocial  Psychosocial (WDL) WDL

## 2018-11-13 NOTE — ED Notes (Signed)
Attempted to ambulate pt for possible discharge.  Pt unable to sit on side of bed under his own power.  Dr. Cinda Quest aware of same.

## 2018-11-13 NOTE — Progress Notes (Signed)
Pre HD Assessment   11/13/18 2015  Neurological  Level of Consciousness Alert  Orientation Level Oriented to person;Oriented to place;Oriented to time;Disoriented to situation  Respiratory  Respiratory Pattern Regular  Chest Assessment Chest expansion symmetrical  Bilateral Breath Sounds Coarse crackles  Cough Non-productive  Cardiac  Pulse Regular  Heart Sounds S1, S2  ECG Monitor Yes  Vascular  R Radial Pulse +2  L Radial Pulse +2  Edema Right lower extremity;Left lower extremity  RLE Edema +1  LLE Edema +1  Integumentary  Integumentary (WDL) X  Skin Color Appropriate for ethnicity  Skin Condition Dry  Skin Integrity  (HD Pt)  Musculoskeletal  Musculoskeletal (WDL) X  Generalized Weakness Yes  Gastrointestinal  Bowel Sounds Assessment Active  GU Assessment  Genitourinary (WDL) X  Genitourinary Symptoms Anuria (HD pt 6+ years)  Psychosocial  Psychosocial (WDL) WDL

## 2018-11-13 NOTE — H&P (Signed)
[facility] NAME: [name]    MR#:  [id number]  DATE OF BIRTH:  [birth year]  DATE OF ADMISSION:  11/13/2018  PRIMARY CARE PHYSICIAN: [name], MD   REQUESTING/REFERRING PHYSICIAN: Conni Slipper, MD  CHIEF COMPLAINT:   Chief Complaint  Patient presents with   Cough   Weakness    HISTORY OF PRESENT ILLNESS:  [name]  is a 68 y.o. male with a known history of chronic diastolic congestive heart failure, ESRD on HD TTS, hyperlipidemia, hypertension, type 2 diabetes who presented to the ED with generalized weakness and cough that started this morning.  Patient states that he overall just "feels bad".  He denies any shortness of breath, fevers, or chills.  He denies any chest pain.  In the ED, vitals were unremarkable.  Labs were significant for WBC 11.8, BNP 163, troponin 0.03.  CT chest with mild left lower lobe opacity which may be due to atelectasis, infiltrate, or scarring.  He was started on ceftriaxone and azithromycin for possible CAP.  The ED staff attempted to ambulate patient, but he was unable to even sit up on the side of the bed. Hospitalists were called for admission.  PAST MEDICAL HISTORY:   Past Medical History:  Diagnosis Date   Chronic diastolic congestive heart failure (HCC)    Chronic disease anemia    ESRD (end stage renal disease) on dialysis ([place])    "[place]; [place]; [name]" (09/29/2014)   GERD (gastroesophageal reflux disease)    Gout    High cholesterol    History of blood transfusion    "related to anemia"   History of stomach ulcers    Hypertension    Type II diabetes mellitus ([place])     PAST SURGICAL HISTORY:   Past Surgical History:  Procedure Laterality Date   ARTERIOVENOUS GRAFT PLACEMENT Left ~ [place]  09/29/2014   "[place]"   CARDIAC CATHETERIZATION N/A 02/26/2016   Procedure: Left Heart Cath and Coronary Angiography Possible PCI;  Surgeon:  [name], MD;  Location: [facility];  Service: Cardiovascular;  Laterality: N/A;   ENDOSCOPIC RETROGRADE CHOLANGIOPANCREATOGRAPHY (ERCP) WITH PROPOFOL N/A 09/17/2016   Procedure: ENDOSCOPIC RETROGRADE CHOLANGIOPANCREATOGRAPHY (ERCP) WITH PROPOFOL;  Surgeon: [name], MD;  Location: [facility] ENDOSCOPY;  Service: Endoscopy;  Laterality: N/A;   ERCP N/A 09/10/2016   Procedure: ENDOSCOPIC RETROGRADE CHOLANGIOPANCREATOGRAPHY (ERCP);  Surgeon: [name], MD;  Location: [facility] ENDOSCOPY;  Service: Endoscopy;  Laterality: N/A;   KIDNEY TRANSPLANT Right 2004   NEPHRECTOMY TRANSPLANTED ORGAN  2015   PERCUTANEOUS CORONARY STENT INTERVENTION (PCI-S) N/A 09/30/2014   Procedure: PERCUTANEOUS CORONARY STENT INTERVENTION (PCI-S);  Surgeon: [name], MD;  Location: [facility] CATH LAB;  Service: Cardiovascular;  Laterality: N/A;   PERITONEAL CATHETER INSERTION  02/2014   PERITONEAL CATHETER REMOVAL  08/2014   THROMBECTOMY / ARTERIOVENOUS GRAFT REVISION  2015    SOCIAL HISTORY:   Social History   Tobacco Use   Smoking status: Former Smoker    Types: Cigarettes   Smokeless tobacco: Never Used   Tobacco comment: "quit smoking cigarettes in the 1980's"  Substance Use Topics   Alcohol use: No    FAMILY HISTORY:   Family History  Problem Relation Age of Onset   Hypertension Mother    Stroke Mother    Hypertension Father    Diabetes Mellitus II Father    Asthma Father     DRUG ALLERGIES:   Allergies  Allergen Reactions   Ivp Dye [Iodinated Diagnostic Agents] Other (See Comments)    Pt denied    REVIEW OF SYSTEMS:   Review of Systems  Constitutional: Positive for malaise/fatigue. Negative for chills and fever.  HENT: Negative for congestion and sore throat.   Eyes: Negative for blurred vision and double vision.  Respiratory: Positive for cough. Negative for sputum production and shortness of breath.   Cardiovascular: Negative for chest pain and palpitations.    Gastrointestinal: Negative for nausea and vomiting.  Genitourinary: Negative for dysuria and urgency.  Musculoskeletal: Negative for back pain and neck pain.  Neurological: Positive for weakness. Negative for dizziness, focal weakness and headaches.  Psychiatric/Behavioral: Negative for depression. The patient is not nervous/anxious.     MEDICATIONS AT HOME:   Prior to Admission medications   Medication Sig Start Date End Date Taking? Authorizing Provider  allopurinol (ZYLOPRIM) 100 MG tablet Take 100 mg by mouth daily. 09/23/14  Yes [provider]  aspirin 81 MG EC tablet Take 81 mg by mouth daily. 09/23/14  Yes [provider]  atorvastatin (LIPITOR) 80 MG tablet Take 80 mg by mouth daily. 09/18/18  Yes [provider]  calcium acetate (PHOSLO) 667 MG capsule Take 1,334 mg by mouth 3 (three) times daily with meals.   Yes [provider]  clopidogrel (PLAVIX) 75 MG tablet Take 1 tablet (75 mg total) by mouth daily with breakfast. 10/02/14  Yes [name], MD  levETIRAcetam (KEPPRA) 750 MG tablet Take 750 mg by mouth daily.   Yes [provider]  losartan (COZAAR) 25 MG tablet Take 1 tablet (25 mg total) by mouth daily. 09/18/16  Yes Demetrios Loll, MD  metoprolol succinate (TOPROL-XL) 25 MG 24 hr tablet Take 12.5-25 mg by mouth See admin instructions. Take 25 mg by mouth on non-dialysis days and 12.5 mg on dialysis days (Tuesday, Thursday and Saturday) after dialysis.    Yes [provider]  mirtazapine (REMERON) 15 MG tablet Take 15 mg by mouth at bedtime.   Yes [provider]  multivitamin (RENA-VIT) TABS tablet Take 1 tablet by mouth at bedtime. 10/02/14  Yes [name], MD  nitroGLYCERIN (NITROSTAT) 0.4 MG SL tablet Place 1 tablet (0.4 mg total) under the tongue every 5 (five) minutes x 3 doses as needed for chest pain. 02/26/18  Yes Demetrios Loll, MD  vitamin C (VITAMIN C) 250 MG tablet Take 1 tablet (250 mg total) by mouth 2 (two)  times daily. 02/27/18  Yes Demetrios Loll, MD  hydrALAZINE (APRESOLINE) 25 MG tablet Take 1 tablet (25 mg total) by mouth every 8 (eight) hours. Patient not taking: Reported on 11/13/2018 02/26/18   Demetrios Loll, MD  levETIRAcetam (KEPPRA) 1000 MG tablet Take 1 tablet (1,000 mg total) by mouth daily. Patient not taking: Reported on 09/29/2018 02/26/18   Demetrios Loll, MD  levETIRAcetam (KEPPRA) 250 MG tablet Take on the Days on Dialysis (Tues, Thurs, Sat) after Dialysis. Patient not taking: Reported on 11/13/2018 07/29/16   Henreitta Leber, MD  Nutritional Supplements (FEEDING SUPPLEMENT, NEPRO CARB STEADY,) LIQD Take 237 mLs by mouth 2 (two) times daily between meals. 02/27/18   Demetrios Loll, MD      VITAL SIGNS:  Blood pressure (!) 163/72, pulse 64, temperature 99.1 F (37.3 C), temperature source Oral, resp. rate 18, height 5\' 1"  (1.549 m), weight 70.3 kg, SpO2 100 %.  PHYSICAL EXAMINATION:  Physical Exam  GENERAL:  68 y.o.-year-old patient lying in the bed with no acute distress.  EYES: Pupils equal, round, reactive to light and accommodation. No scleral icterus. Extraocular muscles intact.  HEENT: Head atraumatic, normocephalic. Oropharynx and nasopharynx clear.  NECK:  Supple, no jugular venous distention. No thyroid enlargement, no tenderness.  LUNGS: + Diffuse rhonchi present.  No crackles or wheezing. No use of accessory muscles of respiration.  Able to speak in full sentences. CARDIOVASCULAR: RRR, S1, S2 normal. No murmurs, rubs, or gallops.  ABDOMEN: Soft, nontender, nondistended. Bowel sounds present. No organomegaly or mass.  EXTREMITIES: No pedal edema, cyanosis, or clubbing.  NEUROLOGIC: Cranial nerves II through XII are intact. + Global weakness. Sensation intact. Gait not checked.  PSYCHIATRIC: The patient is alert and oriented x 3.  SKIN: No obvious rash, lesion, or ulcer.   LABORATORY PANEL:   CBC Recent Labs  Lab 11/13/18 1052  WBC 11.6*  HGB 11.6*  HCT 37.2*  PLT 209    ------------------------------------------------------------------------------------------------------------------  Chemistries  Recent Labs  Lab 11/13/18 1052  NA 140  K 4.6  CL 97*  CO2 28  GLUCOSE 110*  BUN 46*  CREATININE 9.19*  CALCIUM 8.8*  AST 19  ALT 12  ALKPHOS 104  BILITOT 0.7   ------------------------------------------------------------------------------------------------------------------  Cardiac Enzymes Recent Labs  Lab 11/13/18 1312  TROPONINI 0.03*   ------------------------------------------------------------------------------------------------------------------  RADIOLOGY:  Ct Chest Wo Contrast  Result Date: 11/13/2018 CLINICAL DATA:  Cough. EXAM: CT CHEST WITHOUT CONTRAST TECHNIQUE: Multidetector CT imaging of the chest was performed following the standard protocol without IV contrast. COMPARISON:  Radiograph of same day.  CT scan of September 04, 2016. FINDINGS: Cardiovascular: Atherosclerosis of thoracic aorta is noted without aneurysm formation. Normal cardiac size. Status post coronary bypass graft. No pericardial effusion is noted. Mediastinum/Nodes: No enlarged mediastinal or axillary lymph nodes. Thyroid gland, trachea, and esophagus demonstrate no significant findings. Lungs/Pleura: No pneumothorax or pleural effusion is noted. Ground-glass opacity seen in right upper lobe on prior exam is no longer visualized. Left lower lobe subsegmental atelectasis, infiltrate or scarring is noted. Upper Abdomen: No acute abnormality. 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 No focal abnormality is noted. IMPRESSION: Mild left lower lobe opacity is noted which may represent subsegmental atelectasis, infiltrate or possibly scarring. Ground-glass opacity seen in right upper lobe on prior exam is no longer present. Aortic Atherosclerosis (ICD10-I70.0). Electronically Signed   By: Marijo Conception M.D.   On: 11/13/2018 13:34    Dg Chest Portable 1 View  Result Date: 11/13/2018 CLINICAL DATA:  Cough and weakness EXAM: PORTABLE CHEST 1 VIEW COMPARISON:  September 29, 2018 FINDINGS: There is persistent airspace consolidation in the left base with small left pleural effusion. Lungs elsewhere are clear. Heart is mildly enlarged with pulmonary vascularity normal. No adenopathy. Patient is status post median sternotomy. A stent is noted in the left brachial region. IMPRESSION: Persistent airspace consolidation felt to represent a degree of pneumonia in the left base with small left pleural effusion. Right lung clear. Stable cardiac prominence. Postoperative changes noted. Electronically Signed   By: Lowella Grip III M.D.   On: 11/13/2018 10:48      IMPRESSION AND PLAN:   Community-acquired pneumonia- CT chest showed a left lower lobe opacity that may represent pneumonia.  Given patient's mild leukocytosis, will treat for possible pneumonia.  Not meeting sepsis criteria on admission. -Continue ceftriaxone and azithromycin -Follow-up blood and sputum cultures (blood cultures drawn after first dose of antibiotic) -Check procalcitonin -Wean O2 as able -Duonebs prn -Ambulate with pulse ox prior to discharge -PT  consult  ESRD on HD TTS- has not missed any dialysis sessions recently -Nephrology consult  Mildly elevated troponin- unlikely ACS. No chest pain. -Trend troponin  Hypertension-blood pressures overall controlled in the ED -Continue home losartan and metoprolol  CAD with a history of CABG x 1 -Continue home aspirin and Plavix  Hyperlipidemia-stable -Continue home Lipitor  History of seizures-no seizure-like activity recently -Continue home Keppra  All the records are reviewed and case discussed with ED provider. Management plans discussed with the patient, family and they are in agreement.  CODE STATUS: Full  TOTAL TIME TAKING CARE OF THIS PATIENT: 45 minutes.    Berna Spare Dio Giller M.D on 11/13/2018 at  3:51 PM  Between 7am to 6pm - Pager - [phone]  After 6pm go to [email or web address] - Proofreader  [facility]  Office  [phone]  CC: Primary care physician; [name], MD   Note: This dictation was prepared with Dragon dictation along with smaller phrase technology. Any transcriptional errors that result from this process are unintentional.

## 2018-11-13 NOTE — ED Notes (Signed)
Date and time results received: 11/13/18 12:12 PM   Test: Troponin Critical Value: 0.03  Name of Provider Notified: Cinda Quest

## 2018-11-13 NOTE — ED Triage Notes (Signed)
Pt to ER via EMS from home with c/o cough and weakness.  Pt is dialysis pt and had last treatment yesterday.

## 2018-11-13 NOTE — Progress Notes (Signed)
Family Meeting Note  Advance Directive:no  Today a meeting took place with the Patient.  Patient is able to participate.  The following clinical team members were present during this meeting:MD  The following were discussed:Patient's diagnosis: CAP, generalized weakness, Patient's progosis: Unable to determine and Goals for treatment: Full Code  Additional follow-up to be provided: prn  Time spent during discussion:20 minutes  Evette Doffing, MD

## 2018-11-13 NOTE — Progress Notes (Signed)
[facility]  ROUNDING NOTE   Subjective:   [name] admitted to [facility] on 11/13/2018 for Weakness [R53.1] Community acquired pneumonia of left lung, unspecified part of lung [J18.9]  Placed on dialysis this evening. Normally scheduled for TTS. Seen and examined on hemodialysis treatment. Tolerating treatment.     HEMODIALYSIS FLOWSHEET:  Blood Flow Rate (mL/min): 300 mL/min Arterial Pressure (mmHg): -100 mmHg Venous Pressure (mmHg): 170 mmHg Transmembrane Pressure (mmHg): 50 mmHg Ultrafiltration Rate (mL/min): 280 mL/min Dialysate Flow Rate (mL/min): 600 ml/min Conductivity: Machine : 14 Conductivity: Machine : 14 Dialysis Fluid Bolus: Normal Saline Bolus Amount (mL): 250 mL    Objective:  Vital signs in last 24 hours:  Temp:  [97.7 F (36.5 C)-99.1 F (37.3 C)] 97.7 F (36.5 C) (05/29 2015) Pulse Rate:  [60-76] 72 (05/29 2200) Resp:  [15-21] 19 (05/29 2200) BP: (110-172)/(67-81) 140/77 (05/29 2200) SpO2:  [95 %-100 %] 98 % (05/29 2200) Weight:  [70.3 kg] 70.3 kg (05/29 2015)  Weight change:  Filed Weights   11/13/18 1021 11/13/18 2015  Weight: 70.3 kg 70.3 kg    Intake/Output: No intake/output data recorded.   Intake/Output this shift:  No intake/output data recorded.  Physical Exam: General: NAD, laying in bed  Head: Normocephalic, atraumatic. Moist oral mucosal membranes  Eyes: Anicteric, PERRL  Neck: Supple, trachea midline  Lungs:  Bilateral rhonchi  Heart: Regular rate and rhythm  Abdomen:  Soft, nontender,   Extremities: no peripheral edema.  Neurologic: Nonfocal, moving all four extremities  Skin: No lesions  Access: Left AVG    Basic Metabolic Panel: Recent Labs  Lab 11/13/18 1052  NA 140  K 4.6  CL 97*  CO2 28  GLUCOSE 110*  BUN 46*  CREATININE 9.19*  CALCIUM 8.8*    Liver Function Tests: Recent Labs  Lab 11/13/18 1052  AST 19  ALT 12  ALKPHOS 104  BILITOT 0.7  PROT 8.1  ALBUMIN 3.7   No results for  input(s): LIPASE, AMYLASE in the last 168 hours. No results for input(s): AMMONIA in the last 168 hours.  CBC: Recent Labs  Lab 11/13/18 1052  WBC 11.6*  NEUTROABS 8.2*  HGB 11.6*  HCT 37.2*  MCV 74.8*  PLT 209    Cardiac Enzymes: Recent Labs  Lab 11/13/18 1052 11/13/18 1312 11/13/18 1806  TROPONINI 0.03* 0.03* 0.03*    BNP: Invalid input(s): POCBNP  CBG: No results for input(s): GLUCAP in the last 168 hours.  Microbiology: Results for orders placed or performed during the hospital encounter of 11/13/18  SARS Coronavirus 2 (CEPHEID - Performed in [place] hospital lab), Hosp Order     Status: None   Collection Time: 11/13/18 11:12 AM  Result Value Ref Range Status   SARS Coronavirus 2 NEGATIVE NEGATIVE Final    Comment: (NOTE) If result is NEGATIVE SARS-CoV-2 target nucleic acids are NOT DETECTED. The SARS-CoV-2 RNA is generally detectable in upper and lower  respiratory specimens during the acute phase of infection. The lowest  concentration of SARS-CoV-2 viral copies this assay can detect is 250  copies / mL. A negative result does not preclude SARS-CoV-2 infection  and should not be used as the sole basis for treatment or other  patient management decisions.  A negative result may occur with  improper specimen collection / handling, submission of specimen other  than nasopharyngeal swab, presence of viral mutation(s) within the  areas targeted by this assay, and inadequate number of viral copies  (<250 copies /  mL). A negative result must be combined with clinical  observations, patient history, and epidemiological information. If result is POSITIVE SARS-CoV-2 target nucleic acids are DETECTED. The SARS-CoV-2 RNA is generally detectable in upper and lower  respiratory specimens dur ing the acute phase of infection.  Positive  results are indicative of active infection with SARS-CoV-2.  Clinical  correlation with patient history and other diagnostic  information is  necessary to determine patient infection status.  Positive results do  not rule out bacterial infection or co-infection with other viruses. If result is PRESUMPTIVE POSTIVE SARS-CoV-2 nucleic acids MAY BE PRESENT.   A presumptive positive result was obtained on the submitted specimen  and confirmed on repeat testing.  While 2019 novel coronavirus  (SARS-CoV-2) nucleic acids may be present in the submitted sample  additional confirmatory testing may be necessary for epidemiological  and / or clinical management purposes  to differentiate between  SARS-CoV-2 and other Sarbecovirus currently known to infect humans.  If clinically indicated additional testing with an alternate test  methodology [phone]) is advised. The SARS-CoV-2 RNA is generally  detectable in upper and lower respiratory sp ecimens during the acute  phase of infection. The expected result is Negative. Fact Sheet for Patients:  StrictlyIdeas.no Fact Sheet for Healthcare Providers: BankingDealers.co.za This test is not yet approved or cleared by the [place] FDA and has been authorized for detection and/or diagnosis of SARS-CoV-2 by FDA under an Emergency Use Authorization (EUA).  This EUA will remain in effect (meaning this test can be used) for the duration of the COVID-19 declaration under Section 564(b)(1) of the Act, 21 U.S.C. section 360bbb-3(b)(1), unless the authorization is terminated or revoked sooner. Performed at [facility], [address] [place], [place] [address]     Coagulation Studies: No results for input(s): LABPROT, INR in the last 72 hours.  Urinalysis: No results for input(s): COLORURINE, LABSPEC, PHURINE, GLUCOSEU, HGBUR, BILIRUBINUR, KETONESUR, PROTEINUR, UROBILINOGEN, NITRITE, LEUKOCYTESUR in the last 72 hours.  Invalid input(s): APPERANCEUR    Imaging: Ct Head Wo Contrast  Result Date: 11/13/2018 CLINICAL  DATA:  Dialysis patient.  Cough and weakness. EXAM: CT HEAD WITHOUT CONTRAST TECHNIQUE: Contiguous axial images were obtained from the base of the skull through the vertex without intravenous contrast. COMPARISON:  02/19/2018 FINDINGS: Brain: Generalized atrophy. Chronic small-vessel ischemic changes affecting the cerebral hemispheric white matter. 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 No sign of acute infarction, mass lesion, hemorrhage, hydrocephalus or extra-axial collection. Vascular: There is atherosclerotic calcification of the major vessels at the base of the brain. Skull: Normal Sinuses/Orbits: Clear/normal Other: None IMPRESSION: No acute finding by CT. Atrophy and chronic small-vessel ischemic changes. 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 Electronically Signed   By: Nelson Chimes M.D.   On: 11/13/2018 17:01   Ct Chest Wo Contrast  Result Date: 11/13/2018 CLINICAL DATA:  Cough. EXAM: CT CHEST WITHOUT CONTRAST TECHNIQUE: Multidetector CT imaging of the chest was performed following the standard protocol without IV contrast. COMPARISON:  Radiograph of same day.  CT scan of September 04, 2016. FINDINGS: Cardiovascular: Atherosclerosis of thoracic aorta is noted without aneurysm formation. Normal cardiac size. Status post coronary bypass graft. No pericardial effusion is noted. Mediastinum/Nodes: No enlarged mediastinal or axillary lymph nodes. Thyroid gland, trachea, and esophagus demonstrate no significant findings. Lungs/Pleura: No pneumothorax or pleural effusion is noted. Ground-glass opacity seen in right upper lobe on prior exam is no longer visualized. Left lower lobe  subsegmental atelectasis, infiltrate or scarring is noted. Upper Abdomen: No acute abnormality. 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 No focal abnormality is noted. IMPRESSION: Mild left lower lobe opacity  is noted which may represent subsegmental atelectasis, infiltrate or possibly scarring. Ground-glass opacity seen in right upper lobe on prior exam is no longer present. Aortic Atherosclerosis (ICD10-I70.0). Electronically Signed   By: Marijo Conception M.D.   On: 11/13/2018 13:34   Dg Chest Portable 1 View  Result Date: 11/13/2018 CLINICAL DATA:  Cough and weakness EXAM: PORTABLE CHEST 1 VIEW COMPARISON:  September 29, 2018 FINDINGS: There is persistent airspace consolidation in the left base with small left pleural effusion. Lungs elsewhere are clear. Heart is mildly enlarged with pulmonary vascularity normal. No adenopathy. Patient is status post median sternotomy. A stent is noted in the left brachial region. IMPRESSION: Persistent airspace consolidation felt to represent a degree of pneumonia in the left base with small left pleural effusion. Right lung clear. Stable cardiac prominence. Postoperative changes noted. Electronically Signed   By: Lowella Grip III M.D.   On: 11/13/2018 10:48     Medications:   . [START ON 11/14/2018] azithromycin    . [START ON 11/14/2018] cefTRIAXone (ROCEPHIN)  IV     . allopurinol  100 mg Oral Daily  . aspirin EC  81 mg Oral Daily  . atorvastatin  80 mg Oral Daily  . [START ON 11/14/2018] calcium acetate  1,334 mg Oral TID WC  . [START ON 11/14/2018] Chlorhexidine Gluconate Cloth  6 each Topical Q0600  . [START ON 11/14/2018] clopidogrel  75 mg Oral Q breakfast  . [START ON 11/14/2018] feeding supplement (NEPRO CARB STEADY)  237 mL Oral BID BM  . heparin  5,000 Units Subcutaneous Q8H  . levETIRAcetam  750 mg Oral Daily  . losartan  25 mg Oral Daily  . metoprolol succinate  12.5-25 mg Oral See admin instructions  . mirtazapine  15 mg Oral QHS  . multivitamin  1 tablet Oral QHS     Assessment/ Plan:  Mr. Erasmus Bistline is a 68 y.o. Asian [place]) male with end stage renal disease on hemodialysis, failed renal transplant, hypertension, diabetes mellitus type II,  GERD, diastolic congestive heart failure admitted to [facility] on 11/13/2018 for pneumonia  [facility] [address] TTS 72kg Left AVG  1. End Stage Renal Disease: seen and examined on hemodialysis treatment. Tolerated treatment well.  Monitor daily for dialysis need  2. Hypertension: blood pressures at goal during treatment. Restart metoprolol and losartan Regularly gets midodrine prior to dialysis treatments.   3. Anemia of chronic kidney disease: Hemoglobin 11.6. No indication for EPO.   4. Secondary Hyperparathyroidism: outpatient labs 5/12 PTH 455, phos 4.7, calcium 8.1 - calcium acetate with meals.   5. Pneumonia - azithromycin and ceftriaxone empirically.    LOS: 0 [place] 5/29/202010:16 PM

## 2018-11-13 NOTE — ED Provider Notes (Signed)
[facility] Emergency Department Provider Note   ____________________________________________   First MD Initiated Contact with Patient 11/13/18 1042     (approximate)  I have reviewed the triage vital signs and the nursing notes.   HISTORY  Chief Complaint Cough and Weakness    HPI [name] is a 68 y.o. male patient gets dialysis and had a yesterday.  He complains of cough unsure if there is any productivity of the cough and weakness.  He has a low-grade fever temperature 99.1.  Does not appear to be short of breath.  He does not have any chest pain.        Past Medical History:  Diagnosis Date  . Chronic diastolic congestive heart failure ([place])   . Chronic disease anemia   . ESRD (end stage renal disease) on dialysis ([place])    "[place]; [place]; [name]" (09/29/2014)  . GERD (gastroesophageal reflux disease)   . Gout   . High cholesterol   . History of blood transfusion    "related to anemia"  . History of stomach ulcers   . Hypertension   . Type II diabetes mellitus [facility])     Patient Active Problem List   Diagnosis Date Noted  . CVA (cerebral vascular accident) ([place])   . Palliative care by specialist   . Acute encephalopathy 02/19/2018  . Calculus of common duct   . Fitting and adjustment of gastrointestinal appliance and device   . Elevated LFTs 09/16/2016  . Calculus of bile duct without cholecystitis with obstruction   . Choledocholithiasis   . Diabetes ([place]) 09/04/2016  . HTN (hypertension) 09/04/2016  . GERD (gastroesophageal reflux disease) 09/04/2016  . Chronic diastolic CHF (congestive heart failure) ([place]) 09/04/2016  . Transaminitis 09/04/2016  . Sepsis ([place]) 09/04/2016  . CAP (community acquired pneumonia) 09/04/2016  . Encephalopathy acute   . HCAP (healthcare-associated pneumonia) 07/24/2016  . Bradycardia 06/25/2016  . Alteration in self-care ability 03/05/2016  . Hx of subdural hematoma 03/05/2016  . S/P CABG x 1  02/29/2016  . NSTEMI (non-ST elevated myocardial infarction) ([place]) 02/26/2016  . H/O non-ST elevation myocardial infarction (NSTEMI) 01/10/2016  . S/P coronary artery stent placement 01/10/2016  . Depression, major, single episode, complete remission ([place]) 07/22/2015  . Acute respiratory failure with hypoxia ([place]) 02/14/2015  . Syncope 11/05/2014  . Acute cystitis with hematuria 10/11/2014  . Acute subdural hematoma ([place]) 10/10/2014  . Anemia in chronic kidney disease (CKD) 10/07/2014  . ESRD on hemodialysis ([place]) 10/07/2014  . Mixed hyperlipidemia 10/07/2014  . 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 Contaminant given to patient   . Shortness of breath   . Viridans streptococci infection   . Polymicrobial bacterial infection   . Bacteremia   . 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 ESRD on dialysis ([place])   . Type 1 diabetes mellitus with other diabetic kidney complication ([place])   . Acute coronary syndrome ([place]) 09/29/2014  . Anemia associated with chronic renal failure 03/25/2014  . Nausea & vomiting 03/25/2014  . Gout 03/17/2012  . H/O: upper GI bleed 03/17/2012  . Hyperlipidemia, unspecified 03/17/2012  . Kidney transplant status, cadaveric 07/16/2002    Past Surgical History:  Procedure Laterality Date  . ARTERIOVENOUS GRAFT PLACEMENT Left ~ 1996  . CARDIAC CATHETERIZATION  09/29/2014   "[place]"  . CARDIAC CATHETERIZATION N/A 02/26/2016   Procedure: Left Heart Cath and Coronary Angiography Possible PCI;  Surgeon: [name], MD;  Location: [facility] CV LAB;  Service: Cardiovascular;  Laterality: N/A;  . ENDOSCOPIC RETROGRADE CHOLANGIOPANCREATOGRAPHY (ERCP) WITH PROPOFOL N/A 09/17/2016   Procedure: ENDOSCOPIC RETROGRADE CHOLANGIOPANCREATOGRAPHY (ERCP) WITH PROPOFOL;  Surgeon: [name], MD;  Location: [facility] ENDOSCOPY;  Service: Endoscopy;  Laterality: N/A;  . ERCP N/A 09/10/2016   Procedure: ENDOSCOPIC RETROGRADE  CHOLANGIOPANCREATOGRAPHY (ERCP);  Surgeon: [name], MD;  Location: [facility] ENDOSCOPY;  Service: Endoscopy;  Laterality: N/A;  . KIDNEY TRANSPLANT Right 2004  . NEPHRECTOMY TRANSPLANTED ORGAN  2015  . PERCUTANEOUS CORONARY STENT INTERVENTION (PCI-S) N/A 09/30/2014   Procedure: PERCUTANEOUS CORONARY STENT INTERVENTION (PCI-S);  Surgeon: [name], MD;  Location: [facility] CATH LAB;  Service: Cardiovascular;  Laterality: N/A;  . PERITONEAL CATHETER INSERTION  02/2014  . PERITONEAL CATHETER REMOVAL  08/2014  . THROMBECTOMY / ARTERIOVENOUS GRAFT REVISION  2015    Prior to Admission medications   Medication Sig Start Date End Date Taking? Authorizing Provider  allopurinol (ZYLOPRIM) 100 MG tablet Take 100 mg by mouth daily. 09/23/14   [provider]  aspirin 81 MG EC tablet Take 81 mg by mouth daily. 09/23/14   [provider]  atorvastatin (LIPITOR) 40 MG tablet Take 2 tablets (80 mg total) by mouth daily at 6 PM. 09/14/16   Bettey Costa, MD  calcium acetate (PHOSLO) 667 MG capsule Take 1,334 mg by mouth 3 (three) times daily with meals.    [provider]  clopidogrel (PLAVIX) 75 MG tablet Take 1 tablet (75 mg total) by mouth daily with breakfast. 10/02/14   [name], MD  hydrALAZINE (APRESOLINE) 25 MG tablet Take 1 tablet (25 mg total) by mouth every 8 (eight) hours. 02/26/18   Demetrios Loll, MD  levETIRAcetam (KEPPRA) 1000 MG tablet Take 1 tablet (1,000 mg total) by mouth daily. Patient not taking: Reported on 09/29/2018 02/26/18   Demetrios Loll, MD  levETIRAcetam (KEPPRA) 250 MG tablet Take on the Days on Dialysis (Tues, Thurs, Sat) after Dialysis. Patient taking differently: Take 250 mg by mouth daily. Take 250 mg on Dialysis days (Tues, Thurs, Sat) after Dialysis. 07/29/16   Henreitta Leber, MD  levETIRAcetam (KEPPRA) 750 MG tablet Take 750 mg by mouth daily.    [provider]  losartan (COZAAR) 25 MG tablet Take 1 tablet (25 mg total) by mouth daily. 09/18/16   Demetrios Loll, MD  metoprolol succinate (TOPROL-XL) 25 MG 24 hr tablet Take 12.5-25 mg by mouth See admin instructions. Take 25 mg by mouth on non-dialysis days and 12.5 mg on dialysis days (Tuesday, Thursday and Saturday) after dialysis.     [provider]  mirtazapine (REMERON) 15 MG tablet Take 15 mg by mouth at bedtime.    [provider]  multivitamin (RENA-VIT) TABS tablet Take 1 tablet by mouth at bedtime. 10/02/14   [name], MD  nitroGLYCERIN (NITROSTAT) 0.4 MG SL tablet Place 1 tablet (0.4 mg total) under the tongue every 5 (five) minutes x 3 doses as needed for chest pain. 02/26/18   Demetrios Loll, MD  Nutritional Supplements (FEEDING SUPPLEMENT, NEPRO CARB STEADY,) LIQD Take 237 mLs by mouth 2 (two) times daily between meals. 02/27/18   Demetrios Loll, MD  vitamin C (VITAMIN C) 250 MG tablet Take 1 tablet (250 mg total) by mouth 2 (two) times daily. 02/27/18   Demetrios Loll, MD    Allergies Ivp dye [iodinated diagnostic agents]  Family History  Problem Relation Age of Onset  . Hypertension Mother   . Stroke Mother   . Hypertension Father   . Diabetes Mellitus  II Father   . Asthma Father     Social History Social History   Tobacco Use  . Smoking status: Former Smoker    Types: Cigarettes  . Smokeless tobacco: Never Used  . Tobacco comment: "quit smoking cigarettes in the 1980's"  Substance Use Topics  . Alcohol use: No  . Drug use: No    Review of Systems  Constitutional: Slight fever no chills Eyes: No visual changes. ENT: No sore throat. Cardiovascular: Denies chest pain. Respiratory: Denies shortness of breath. Gastrointestinal: No abdominal pain.  No nausea, no vomiting.  No diarrhea.  No constipation. Genitourinary: Negative for dysuria. Musculoskeletal: Negative for back pain. Skin: Negative for rash. Neurological: Negative for headaches, focal weakness   ____________________________________________   PHYSICAL EXAM:  VITAL SIGNS: ED Triage  Vitals  Enc Vitals Group     BP 11/13/18 1014 (!) 152/78     Pulse Rate 11/13/18 1014 73     Resp 11/13/18 1014 17     Temp 11/13/18 1014 99.1 F (37.3 C)     Temp Source 11/13/18 1014 Oral     SpO2 11/13/18 1014 95 %     Weight 11/13/18 1021 155 lb (70.3 kg)     Height 11/13/18 1021 5\' 1"  (1.549 m)     Head Circumference --      Peak Flow --      Pain Score 11/13/18 1021 0     Pain Loc --      Pain Edu? --      Excl. in [place]? --     Constitutional: Alert and oriented.  Flushed but otherwise well appearing and in no acute distress. Eyes: Conjunctivae are normal Head: Atraumatic. Nose: No congestion/rhinnorhea. Mouth/Throat: Mucous membranes are moist.  Oropharynx non-erythematous. Neck: No stridor.   Cardiovascular: Normal rate, regular rhythm. Grossly normal heart sounds.  Good peripheral circulation. Respiratory: Normal respiratory effort.  No retractions. Lungs CTAB. Gastrointestinal: Soft and nontender. No distention. No abdominal bruits. No CVA tenderness. Musculoskeletal: No lower extremity tenderness nor edema.  Neurologic:  Normal speech and language. No gross focal neurologic deficits are appreciated.  Skin:  Skin is warm, dry and intact. No rash noted.   ____________________________________________   LABS (all labs ordered are listed, but only abnormal results are displayed)  Labs Reviewed  BASIC METABOLIC PANEL - Abnormal; Notable for the following components:      Result Value   Chloride 97 (*)    Glucose, Bld 110 (*)    BUN 46 (*)    Creatinine, Ser 9.19 (*)    Calcium 8.8 (*)    GFR calc non Af Amer 5 (*)    GFR calc Af Amer 6 (*)    All other components within normal limits  TROPONIN I - Abnormal; Notable for the following components:   Troponin I 0.03 (*)    All other components within normal limits  BRAIN NATRIURETIC PEPTIDE - Abnormal; Notable for the following components:   B Natriuretic Peptide 163.0 (*)    All other components within normal  limits  CBC WITH DIFFERENTIAL/PLATELET - Abnormal; Notable for the following components:   WBC 11.6 (*)    Hemoglobin 11.6 (*)    HCT 37.2 (*)    MCV 74.8 (*)    MCH 23.3 (*)    RDW 16.1 (*)    Neutro Abs 8.2 (*)    Eosinophils Absolute 0.6 (*)    All other components within normal limits  TROPONIN I - Abnormal; Notable  for the following components:   Troponin I 0.03 (*)    All other components within normal limits  SARS CORONAVIRUS 2 (HOSPITAL ORDER, [place] [facility])  HEPATIC FUNCTION PANEL  CBC  URINALYSIS, COMPLETE (UACMP) WITH MICROSCOPIC   ____________________________________________  EKG  EKG read interpreted by me shows normal sinus rhythm rate of 72 left axis nonspecific ST-T wave changes ____________________________________________  RADIOLOGY  ED MD interpretation: Chest x-ray shows persistent left basilar opacification and possibly effusion.  I reviewed the film radiology read it afterward.  CT read by radiology reviewed by me does not help.  Official radiology report(s): Ct Chest Wo Contrast  Result Date: 11/13/2018 CLINICAL DATA:  Cough. EXAM: CT CHEST WITHOUT CONTRAST TECHNIQUE: Multidetector CT imaging of the chest was performed following the standard protocol without IV contrast. COMPARISON:  Radiograph of same day.  CT scan of September 04, 2016. FINDINGS: Cardiovascular: Atherosclerosis of thoracic aorta is noted without aneurysm formation. Normal cardiac size. Status post coronary bypass graft. No pericardial effusion is noted. Mediastinum/Nodes: No enlarged mediastinal or axillary lymph nodes. Thyroid gland, trachea, and esophagus demonstrate no significant findings. Lungs/Pleura: No pneumothorax or pleural effusion is noted. Ground-glass opacity seen in right upper lobe on prior exam is no longer visualized. Left lower lobe subsegmental atelectasis, infiltrate or scarring is noted. Upper Abdomen: No acute abnormality. Musculoskeletal: Diffuse  osseous sclerosis is noted consistent with renal osteodystrophy. No focal abnormality is noted. IMPRESSION: Mild left lower lobe opacity is noted which may represent subsegmental atelectasis, infiltrate or possibly scarring. Ground-glass opacity seen in right upper lobe on prior exam is no longer present. Aortic Atherosclerosis (ICD10-I70.0). Electronically Signed   By: Marijo Conception M.D.   On: 11/13/2018 13:34   Dg Chest Portable 1 View  Result Date: 11/13/2018 CLINICAL DATA:  Cough and weakness EXAM: PORTABLE CHEST 1 VIEW COMPARISON:  September 29, 2018 FINDINGS: There is persistent airspace consolidation in the left base with small left pleural effusion. Lungs elsewhere are clear. Heart is mildly enlarged with pulmonary vascularity normal. No adenopathy. Patient is status post median sternotomy. A stent is noted in the left brachial region. IMPRESSION: Persistent airspace consolidation felt to represent a degree of pneumonia in the left base with small left pleural effusion. Right lung clear. Stable cardiac prominence. Postoperative changes noted. Electronically Signed   By: Lowella Grip III M.D.   On: 11/13/2018 10:48    ____________________________________________   PROCEDURES  Procedure(s) performed (including Critical Care):  Procedures   ____________________________________________   INITIAL IMPRESSION / ASSESSMENT AND PLAN / ED COURSE  Patient is unable to sit up on the side of the bed by himself he is so weak.  Not sure what it is from.  This could be a pneumonia.  I will give him some Rocephin and Zithromax as it appears to been community-acquired.  Hospitalist will admit him and will work on him.              ____________________________________________   FINAL CLINICAL IMPRESSION(S) / ED DIAGNOSES  Final diagnoses:  Weakness  Community acquired pneumonia of left lung, unspecified part of lung     ED Discharge Orders    None       Note:  This document  was prepared using Dragon voice recognition software and may include unintentional dictation errors.    Nena Polio, MD 11/13/18 [id number]

## 2018-11-13 NOTE — Progress Notes (Addendum)
Pre HD Tx  Receiving HD tonight in place of tomorrow's tx (Normally Tues/Thurs/Sat).   Attempting 500 mL fluid removal & BFR @ 300, as pt has h/o going unresponsive and experiencing cardiac issues during HD with flow rates >300 or attempting fluid removal greater than 500 mL (heart unable to handle ultrafiltration rate higher than 280 mL per hour).     11/13/18 2015  Hand-Off documentation  Report given to (Full Name) Trellis Paganini RN   Report received from (Full Name) [facility] RN   Vital Signs  Temp 97.7 F (36.5 C)  Temp Source Oral  Pulse Rate 60  Pulse Rate Source Monitor  BP (!) 172/70  BP Location Right Arm  BP Method Automatic  Patient Position (if appropriate) Lying  Oxygen Therapy  SpO2 100 %  O2 Device Room Air  Pain Assessment  Pain Scale 0-10  Pain Score 0  Dialysis Weight  Weight 70.3 kg  Type of Weight Pre-Dialysis  Time-Out for Hemodialysis  What Procedure? Hemodialysis  Pt Identifiers(min of two) First/Last Name;MRN/Account#  Correct Site? Yes  Correct Side? Yes  Correct Procedure? Yes  Consents Verified? Yes  Rad Studies Available? N/A  Safety Precautions Reviewed? Yes  Engineer, civil (consulting) Number 7  Station Number 2  UF/Alarm Test Passed  Conductivity: Meter 14  Conductivity: Machine  14  pH 7.4  Reverse Osmosis Main  Normal Saline Lot Number [id number]  Dialyzer Lot Number [id number]  Disposable Set Lot Number [id number]  Machine Temperature 98.6 F (37 C)  Musician and Audible Yes  Blood Lines Intact and Secured Yes  Pre Treatment Patient Checks  Vascular access used during treatment Graft  Hepatitis B Surface Antigen Results Negative  Date Hepatitis B Surface Antigen Drawn 10/27/18  Hemodialysis Consent Verified Yes  Hemodialysis Standing Orders Initiated Yes  ECG (Telemetry) Monitor On Yes  Prime Ordered Normal Saline  Length of  DialysisTreatment -hour(s) 3 Hour(s)  Dialysis Treatment Comments Na 140   Dialyzer Elisio 17H NR   Dialysate 2K, 2.5 Ca  Dialysate Flow Ordered 600  Blood Flow Rate Ordered 400 mL/min  Ultrafiltration Goal 1.5 Liters  Dialysis Blood Pressure Support Ordered Normal Saline  Education / Care Plan  Dialysis Education Provided Yes  Documented Education in Care Plan Yes  Fistula / Graft Left Forearm Arteriovenous vein graft  No Placement Date or Time found.   Placed prior to admission: Yes  Orientation: Left  Access Location: Forearm  Access Type: Arteriovenous vein graft  Site Condition No complications  Fistula / Graft Assessment Present;Thrill;Bruit  Status Accessed  Needle Size 15  Drainage Description None

## 2018-11-13 NOTE — ED Notes (Signed)
ED TO INPATIENT HANDOFF REPORT  ED Nurse Name and Phone #: Anderson Malta [phone]  S Name/Age/Gender [name] 68 y.o. male Room/Bed: ED35A/ED35A  Code Status   Code Status: Prior  Home/SNF/Other Home Patient oriented to: self, place, time and situation Is this baseline? Yes   Triage Complete: Triage complete  Chief Complaint cough;weakness  Triage Note Pt to ER via EMS from home with c/o cough and weakness.  Pt is dialysis pt and had last treatment yesterday.   Allergies Allergies  Allergen Reactions  . Ivp Dye [Iodinated Diagnostic Agents] Other (See Comments)    Pt denied    Level of Care/Admitting Diagnosis ED Disposition    ED Disposition Condition [place]: [place] [[id number]]  Level of Care: Med-Surg [16]  Covid Evaluation: Confirmed COVID Negative  Diagnosis: Community acquired pneumonia [[id number]]  Admitting Physician: Hyman Bible DODD [[id number]]  Attending Physician: Hyman Bible DODD [[id number]]  Estimated length of stay: past midnight tomorrow  Certification:: I certify this patient will need inpatient services for at least 2 midnights  PT Class (Do Not Modify): Inpatient [101]  PT Acc Code (Do Not Modify): Private [1]       B Medical/Surgery History Past Medical History:  Diagnosis Date  . Chronic diastolic congestive heart failure ([place])   . Chronic disease anemia   . ESRD (end stage renal disease) on dialysis ([place])    "[place]; [place]; [name]" (09/29/2014)  . GERD (gastroesophageal reflux disease)   . Gout   . High cholesterol   . History of blood transfusion    "related to anemia"  . History of stomach ulcers   . Hypertension   . Type II diabetes mellitus ([place])    Past Surgical History:  Procedure Laterality Date  . ARTERIOVENOUS GRAFT PLACEMENT Left ~ 1996  . CARDIAC CATHETERIZATION  09/29/2014   "[place]"  . CARDIAC CATHETERIZATION N/A 02/26/2016   Procedure: Left Heart Cath and Coronary  Angiography Possible PCI;  Surgeon: [name], MD;  Location: [facility] CV LAB;  Service: Cardiovascular;  Laterality: N/A;  . ENDOSCOPIC RETROGRADE CHOLANGIOPANCREATOGRAPHY (ERCP) WITH PROPOFOL N/A 09/17/2016   Procedure: ENDOSCOPIC RETROGRADE CHOLANGIOPANCREATOGRAPHY (ERCP) WITH PROPOFOL;  Surgeon: [name], MD;  Location: [facility] ENDOSCOPY;  Service: Endoscopy;  Laterality: N/A;  . ERCP N/A 09/10/2016   Procedure: ENDOSCOPIC RETROGRADE CHOLANGIOPANCREATOGRAPHY (ERCP);  Surgeon: [name], MD;  Location: [facility] ENDOSCOPY;  Service: Endoscopy;  Laterality: N/A;  . KIDNEY TRANSPLANT Right 2004  . NEPHRECTOMY TRANSPLANTED ORGAN  2015  . PERCUTANEOUS CORONARY STENT INTERVENTION (PCI-S) N/A 09/30/2014   Procedure: PERCUTANEOUS CORONARY STENT INTERVENTION (PCI-S);  Surgeon: [name], MD;  Location: [facility] CATH LAB;  Service: Cardiovascular;  Laterality: N/A;  . PERITONEAL CATHETER INSERTION  02/2014  . PERITONEAL CATHETER REMOVAL  08/2014  . THROMBECTOMY / ARTERIOVENOUS GRAFT REVISION  2015     A IV Location/Drains/Wounds Patient Lines/Drains/Airways Status   Active Line/Drains/Airways    Name:   Placement date:   Placement time:   Site:   Days:   Peripheral IV 11/13/18 Right Hand   11/13/18    1051    Hand   less than 1   Fistula / Graft Left Forearm Arteriovenous vein graft   -    -    Forearm             Intake/Output Last 24 hours No intake or output data in the 24 hours ending 11/13/18 1629  Labs/Imaging Results for orders placed or  performed during the hospital encounter of 11/13/18 (from the past 48 hour(s))  Basic metabolic panel     Status: Abnormal   Collection Time: 11/13/18 10:52 AM  Result Value Ref Range   Sodium 140 135 - 145 mmol/L   Potassium 4.6 3.5 - 5.1 mmol/L   Chloride 97 (L) 98 - 111 mmol/L   CO2 28 22 - 32 mmol/L   Glucose, Bld 110 (H) 70 - 99 mg/dL   BUN 46 (H) 8 - 23 mg/dL   Creatinine, Ser 9.19 (H) 0.61 - 1.24 mg/dL   Calcium 8.8 (L) 8.9 - 10.3  mg/dL   GFR calc non Af Amer 5 (L) >60 mL/min   GFR calc Af Amer 6 (L) >60 mL/min   Anion gap 15 5 - 15    Comment: Performed at [facility], [address] [place], [place] [address]  Hepatic function panel     Status: None   Collection Time: 11/13/18 10:52 AM  Result Value Ref Range   Total Protein 8.1 6.5 - 8.1 g/dL   Albumin 3.7 3.5 - 5.0 g/dL   AST 19 15 - 41 U/L   ALT 12 0 - 44 U/L   Alkaline Phosphatase 104 38 - 126 U/L   Total Bilirubin 0.7 0.3 - 1.2 mg/dL   Bilirubin, Direct 0.1 0.0 - 0.2 mg/dL   Indirect Bilirubin 0.6 0.3 - 0.9 mg/dL    Comment: Performed at [facility], [address] [place], [place] [address]  Troponin I - Once     Status: Abnormal   Collection Time: 11/13/18 10:52 AM  Result Value Ref Range   Troponin I 0.03 (HH) <0.03 ng/mL    Comment: CRITICAL RESULT CALLED TO, READ BACK BY AND VERIFIED WITH Gustabo Gordillo AT 1145 ON 11/13/18 KLM Performed at [facility], [address] [place], [place] [address]   CBC with Differential/Platelet     Status: Abnormal   Collection Time: 11/13/18 10:52 AM  Result Value Ref Range   WBC 11.6 (H) 4.0 - 10.5 K/uL   RBC 4.97 4.22 - 5.81 MIL/uL   Hemoglobin 11.6 (L) 13.0 - 17.0 g/dL   HCT 37.2 (L) 39.0 - 52.0 %   MCV 74.8 (L) 80.0 - 100.0 fL   MCH 23.3 (L) 26.0 - 34.0 pg   MCHC 31.2 30.0 - 36.0 g/dL   RDW 16.1 (H) 11.5 - 15.5 %   Platelets 209 150 - 400 K/uL   nRBC 0.0 0.0 - 0.2 %   Neutrophils Relative % 71 %   Neutro Abs 8.2 (H) 1.7 - 7.7 K/uL   Lymphocytes Relative 16 %   Lymphs Abs 1.9 0.7 - 4.0 K/uL   Monocytes Relative 8 %   Monocytes Absolute 0.9 0.1 - 1.0 K/uL   Eosinophils Relative 5 %   Eosinophils Absolute 0.6 (H) 0.0 - 0.5 K/uL   Basophils Relative 0 %   Basophils Absolute 0.1 0.0 - 0.1 K/uL    Comment: Performed at [facility], [address] [place], [place] [address]  Brain natriuretic peptide     Status: Abnormal   Collection Time: 11/13/18 10:54  AM  Result Value Ref Range   B Natriuretic Peptide 163.0 (H) 0.0 - 100.0 pg/mL    Comment: Performed at [facility], [address], [place], [place] [address]  SARS Coronavirus 2 (CEPHEID - Performed in [place] hospital lab), Hosp Order     Status: None   Collection Time: 11/13/18  11:12 AM  Result Value Ref Range   SARS Coronavirus 2 NEGATIVE NEGATIVE    Comment: (NOTE) If result is NEGATIVE SARS-CoV-2 target nucleic acids are NOT DETECTED. The SARS-CoV-2 RNA is generally detectable in upper and lower  respiratory specimens during the acute phase of infection. The lowest  concentration of SARS-CoV-2 viral copies this assay can detect is 250  copies / mL. A negative result does not preclude SARS-CoV-2 infection  and should not be used as the sole basis for treatment or other  patient management decisions.  A negative result may occur with  improper specimen collection / handling, submission of specimen other  than nasopharyngeal swab, presence of viral mutation(s) within the  areas targeted by this assay, and inadequate number of viral copies  (<250 copies / mL). A negative result must be combined with clinical  observations, patient history, and epidemiological information. If result is POSITIVE SARS-CoV-2 target nucleic acids are DETECTED. The SARS-CoV-2 RNA is generally detectable in upper and lower  respiratory specimens dur ing the acute phase of infection.  Positive  results are indicative of active infection with SARS-CoV-2.  Clinical  correlation with patient history and other diagnostic information is  necessary to determine patient infection status.  Positive results do  not rule out bacterial infection or co-infection with other viruses. If result is PRESUMPTIVE POSTIVE SARS-CoV-2 nucleic acids MAY BE PRESENT.   A presumptive positive result was obtained on the submitted specimen  and confirmed on repeat testing.  While 2019 novel coronavirus  (SARS-CoV-2)  nucleic acids may be present in the submitted sample  additional confirmatory testing may be necessary for epidemiological  and / or clinical management purposes  to differentiate between  SARS-CoV-2 and other Sarbecovirus currently known to infect humans.  If clinically indicated additional testing with an alternate test  methodology [phone]) is advised. The SARS-CoV-2 RNA is generally  detectable in upper and lower respiratory sp ecimens during the acute  phase of infection. The expected result is Negative. Fact Sheet for Patients:  StrictlyIdeas.no Fact Sheet for Healthcare Providers: BankingDealers.co.za This test is not yet approved or cleared by the [place] FDA and has been authorized for detection and/or diagnosis of SARS-CoV-2 by FDA under an Emergency Use Authorization (EUA).  This EUA will remain in effect (meaning this test can be used) for the duration of the COVID-19 declaration under Section 564(b)(1) of the Act, 21 U.S.C. section 360bbb-3(b)(1), unless the authorization is terminated or revoked sooner. Performed at [facility], [address] [place], [place] [address]   Troponin I - ONCE - STAT     Status: Abnormal   Collection Time: 11/13/18  1:12 PM  Result Value Ref Range   Troponin I 0.03 (HH) <0.03 ng/mL    Comment: CRITICAL VALUE NOTED. VALUE IS CONSISTENT WITH PREVIOUSLY REPORTED/CALLED VALUE KLM Performed at [facility], [address] [place], [place] [address]    Ct Chest Wo Contrast  Result Date: 11/13/2018 CLINICAL DATA:  Cough. EXAM: CT CHEST WITHOUT CONTRAST TECHNIQUE: Multidetector CT imaging of the chest was performed following the standard protocol without IV contrast. COMPARISON:  Radiograph of same day.  CT scan of September 04, 2016. FINDINGS: Cardiovascular: Atherosclerosis of thoracic aorta is noted without aneurysm formation. Normal cardiac size. Status post coronary bypass  graft. No pericardial effusion is noted. Mediastinum/Nodes: No enlarged mediastinal or axillary lymph nodes. Thyroid gland, trachea, and esophagus demonstrate no significant findings. Lungs/Pleura: No pneumothorax or pleural effusion is noted. Ground-glass opacity  seen in right upper lobe on prior exam is no longer visualized. Left lower lobe subsegmental atelectasis, infiltrate or scarring is noted. Upper Abdomen: No acute abnormality. 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 No focal abnormality is noted. IMPRESSION: Mild left lower lobe opacity is noted which may represent subsegmental atelectasis, infiltrate or possibly scarring. Ground-glass opacity seen in right upper lobe on prior exam is no longer present. Aortic Atherosclerosis (ICD10-I70.0). Electronically Signed   By: Marijo Conception M.D.   On: 11/13/2018 13:34   Dg Chest Portable 1 View  Result Date: 11/13/2018 CLINICAL DATA:  Cough and weakness EXAM: PORTABLE CHEST 1 VIEW COMPARISON:  September 29, 2018 FINDINGS: There is persistent airspace consolidation in the left base with small left pleural effusion. Lungs elsewhere are clear. Heart is mildly enlarged with pulmonary vascularity normal. No adenopathy. Patient is status post median sternotomy. A stent is noted in the left brachial region. IMPRESSION: Persistent airspace consolidation felt to represent a degree of pneumonia in the left base with small left pleural effusion. Right lung clear. Stable cardiac prominence. Postoperative changes noted. Electronically Signed   By: Lowella Grip III M.D.   On: 11/13/2018 10:48    Pending Labs Unresulted Labs (From admission, onward)    Start     Ordered   11/13/18 1628  Culture, blood (routine x 2) Call MD if unable to obtain prior to antibiotics being given  BLOOD CULTURE X 2,   STAT    Comments:  If blood cultures drawn in Emergency Department - Do not draw and cancel order    11/13/18 1627    11/13/18 1628  Procalcitonin - Baseline  ONCE - STAT,   STAT     11/13/18 1627   Signed and Held  Culture, sputum-assessment  Once,   R     Signed and Held   Signed and Held  Gram stain  Once,   R     Signed and Held   Signed and Held  HIV antibody (Routine Screening)  Once,   R     Signed and Held   Signed and Held  Strep pneumoniae urinary antigen  Once,   R     Signed and Held   Signed and Held  Troponin I - Once  Once,   R     Signed and Held          Vitals/Pain Today's Vitals   11/13/18 1014 11/13/18 1021 11/13/18 1256 11/13/18 1443  BP: (!) 152/78  139/75 (!) 163/72  Pulse: 73  68 64  Resp: 17  18 18   Temp: 99.1 F (37.3 C)     TempSrc: Oral     SpO2: 95%  96% 100%  Weight:  70.3 kg    Height:  5\' 1"  (1.549 m)    PainSc:  0-No pain      Isolation Precautions No active isolations  Medications Medications  azithromycin (ZITHROMAX) 500 mg in sodium chloride 0.9 % 250 mL IVPB (500 mg Intravenous New Bag/Given 11/13/18 1555)  cefTRIAXone (ROCEPHIN) 1 g in sodium chloride 0.9 % 100 mL IVPB (0 g Intravenous Stopped 11/13/18 1556)    Mobility walks Low fall risk   Focused Assessments Pulmonary Assessment Handoff:  Lung sounds: L Breath Sounds: Diminished R Breath Sounds: Coarse crackles O2 Device: Room Air        R Recommendations: See Admitting Provider Note  Report given to:   Additional Notes: Normally walks at home, but unable  to stand today.  Has not been ambulatory this visit.

## 2018-11-13 NOTE — Progress Notes (Signed)
HD Tx Start   11/13/18 2030  Vital Signs  Pulse Rate 64  Resp 15  BP (!) 170/75  Oxygen Therapy  SpO2 95 %  During Hemodialysis Assessment  Blood Flow Rate (mL/min) 300 mL/min  Arterial Pressure (mmHg) -80 mmHg  Venous Pressure (mmHg) 180 mmHg  Transmembrane Pressure (mmHg) 60 mmHg  Ultrafiltration Rate (mL/min) 670 mL/min (670 mL per HOUR removal )  Dialysate Flow Rate (mL/min) 600 ml/min  Conductivity: Machine  14  HD Safety Checks Performed Yes  Dialysis Fluid Bolus Normal Saline  Bolus Amount (mL) 250 mL  Intra-Hemodialysis Comments Tx initiated

## 2018-11-13 NOTE — Plan of Care (Signed)
  Problem: Clinical Measurements: Goal: Will remain free from infection Outcome: Progressing Goal: Diagnostic test results will improve Outcome: Progressing Goal: Respiratory complications will improve Outcome: Progressing Goal: Cardiovascular complication will be avoided Outcome: Progressing   Problem: Activity: Goal: Risk for activity intolerance will decrease Outcome: Progressing   Problem: Nutrition: Goal: Adequate nutrition will be maintained Outcome: Progressing   Problem: Coping: Goal: Level of anxiety will decrease Outcome: Progressing   Problem: Elimination: Goal: Will not experience complications related to bowel motility Outcome: Progressing   Problem: Pain Managment: Goal: General experience of comfort will improve Outcome: Progressing   Problem: Safety: Goal: Ability to remain free from injury will improve Outcome: Progressing   Problem: Skin Integrity: Goal: Risk for impaired skin integrity will decrease Outcome: Progressing

## 2018-11-13 NOTE — ED Notes (Signed)
Pt assisted in calling his wife, no answer from wife at this time

## 2018-11-13 NOTE — ED Notes (Signed)
Patient transported to CT 

## 2018-11-13 NOTE — Progress Notes (Signed)
HD Tx End  500 mL removed (see pre-tx note), tolerated tx well.     11/13/18 2315  Hand-Off documentation  Report given to (Full Name) [facility] RN  Report received from (Full Name) Trellis Paganini RN  Vital Signs  Temp 98.6 F (37 C)  Temp Source Oral  Pulse Rate 84  Pulse Rate Source Monitor  Resp (!) 22  BP 127/77  BP Location Right Arm  BP Method Automatic  Patient Position (if appropriate) Lying  Oxygen Therapy  SpO2 100 %  O2 Device Room Air  Pain Assessment  Pain Scale 0-10  Pain Score 0  Dialysis Weight  Weight 69.9 kg  Type of Weight Post-Dialysis  During Hemodialysis Assessment  Blood Flow Rate (mL/min) 300 mL/min  Arterial Pressure (mmHg) -120 mmHg  Venous Pressure (mmHg) 180 mmHg  Transmembrane Pressure (mmHg) 50 mmHg  Ultrafiltration Rate (mL/min) 280 mL/min  Dialysate Flow Rate (mL/min) 600 ml/min  Conductivity: Machine  14  HD Safety Checks Performed Yes  Intra-Hemodialysis Comments Progressing as prescribed  Post-Hemodialysis Assessment  Rinseback Volume (mL) 250 mL  Dialyzer Clearance Lightly streaked  Duration of HD Treatment -hour(s) 3 hour(s)  Hemodialysis Intake (mL) 500 mL  UF Total -Machine (mL) 1000 mL  Net UF (mL) 500 mL  Tolerated HD Treatment Yes  AVG/AVF Arterial Site Held (minutes) 10 minutes  AVG/AVF Venous Site Held (minutes) 10 minutes  Fistula / Graft Left Forearm Arteriovenous vein graft  No Placement Date or Time found.   Placed prior to admission: Yes  Orientation: Left  Access Location: Forearm  Access Type: Arteriovenous vein graft  Site Condition No complications  Fistula / Graft Assessment Present;Thrill;Bruit  Status Deaccessed  Drainage Description None

## 2018-11-13 NOTE — ED Notes (Signed)
Pt back in room from CT 

## 2018-11-13 NOTE — ED Notes (Signed)
Son: Nicole Kindred [phone]  Daughter: [name] [phone]

## 2018-11-14 NOTE — Progress Notes (Signed)
[place] [facility] [place] NAME: [name]    MR#:  [id number]  DATE OF BIRTH:  1950-12-19  SUBJECTIVE:   Patient here due to cough and weakness and suspected to have pneumonia.  Patient is quite agitated this morning and did not want to be examined.  No other acute events overnight.  Patient is afebrile, not hypoxic.  REVIEW OF SYSTEMS:    Review of Systems  Unable to perform ROS: Mental acuity    Nutrition: Renal with fluid restriction.  Tolerating Diet: Yes Tolerating PT: Await Eval.   DRUG ALLERGIES:   Allergies  Allergen Reactions   Ivp Dye [Iodinated Diagnostic Agents] Other (See Comments)    Pt denied    VITALS:  Blood pressure (!) 166/81, pulse 87, temperature 98.3 F (36.8 C), temperature source Oral, resp. rate 19, height 5\' 1"  (1.549 m), weight 69.9 kg, SpO2 98 %.  PHYSICAL EXAMINATION:   Physical Exam  GENERAL:  68 y.o.-year-old patient lying in bed lethargic/Encephalopathic.  EYES: Pupils equal, round, reactive to light and accommodation. No scleral icterus. Extraocular muscles intact.  HEENT: Head atraumatic, normocephalic. Oropharynx and nasopharynx clear.  NECK:  Supple, no jugular venous distention. No thyroid enlargement, no tenderness.  LUNGS: Normal breath sounds bilaterally, no wheezing, rales, rhonchi. No use of accessory muscles of respiration.  CARDIOVASCULAR: S1, S2 normal. No murmurs, rubs, or gallops.  ABDOMEN: Soft, nontender, nondistended. Bowel sounds present. No organomegaly or mass.  EXTREMITIES: No cyanosis, clubbing or edema b/l.    NEUROLOGIC: Cranial nerves II through XII are intact. No focal Motor or sensory deficits b/l. Encephalopathic/Lethargic   PSYCHIATRIC: The patient is alert and oriented x 1.  SKIN: No obvious rash, lesion, or ulcer.    LABORATORY PANEL:   CBC Recent Labs  Lab 11/13/18 1052  WBC 11.6*  HGB 11.6*  HCT 37.2*  PLT 209    ------------------------------------------------------------------------------------------------------------------  Chemistries  Recent Labs  Lab 11/13/18 1052  NA 140  K 4.6  CL 97*  CO2 28  GLUCOSE 110*  BUN 46*  CREATININE 9.19*  CALCIUM 8.8*  AST 19  ALT 12  ALKPHOS 104  BILITOT 0.7   ------------------------------------------------------------------------------------------------------------------  Cardiac Enzymes Recent Labs  Lab 11/13/18 1806  TROPONINI 0.03*   ------------------------------------------------------------------------------------------------------------------  RADIOLOGY:  Ct Head Wo Contrast  Result Date: 11/13/2018 CLINICAL DATA:  Dialysis patient.  Cough and weakness. EXAM: CT HEAD WITHOUT CONTRAST TECHNIQUE: Contiguous axial images were obtained from the base of the skull through the vertex without intravenous contrast. COMPARISON:  02/19/2018 FINDINGS: Brain: Generalized atrophy. Chronic small-vessel ischemic changes affecting the cerebral hemispheric white matter. 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 No sign of acute infarction, mass lesion, hemorrhage, hydrocephalus or extra-axial collection. Vascular: There is atherosclerotic calcification of the major vessels at the base of the brain. Skull: Normal Sinuses/Orbits: Clear/normal Other: None IMPRESSION: No acute finding by CT. Atrophy and chronic small-vessel ischemic changes. 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 Electronically Signed   By: Nelson Chimes M.D.   On: 11/13/2018 17:01   Ct Chest Wo Contrast  Result Date: 11/13/2018 CLINICAL DATA:  Cough. EXAM: CT CHEST WITHOUT CONTRAST TECHNIQUE: Multidetector CT imaging of the chest was performed following the standard protocol without IV contrast. COMPARISON:  Radiograph of same day.  CT scan of September 04, 2016. FINDINGS: Cardiovascular: Atherosclerosis of thoracic aorta  is noted without aneurysm formation. Normal cardiac size. Status post coronary  bypass graft. No pericardial effusion is noted. Mediastinum/Nodes: No enlarged mediastinal or axillary lymph nodes. Thyroid gland, trachea, and esophagus demonstrate no significant findings. Lungs/Pleura: No pneumothorax or pleural effusion is noted. Ground-glass opacity seen in right upper lobe on prior exam is no longer visualized. Left lower lobe subsegmental atelectasis, infiltrate or scarring is noted. Upper Abdomen: No acute abnormality. 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 No focal abnormality is noted. IMPRESSION: Mild left lower lobe opacity is noted which may represent subsegmental atelectasis, infiltrate or possibly scarring. Ground-glass opacity seen in right upper lobe on prior exam is no longer present. Aortic Atherosclerosis (ICD10-I70.0). Electronically Signed   By: Marijo Conception M.D.   On: 11/13/2018 13:34   Dg Chest Portable 1 View  Result Date: 11/13/2018 CLINICAL DATA:  Cough and weakness EXAM: PORTABLE CHEST 1 VIEW COMPARISON:  September 29, 2018 FINDINGS: There is persistent airspace consolidation in the left base with small left pleural effusion. Lungs elsewhere are clear. Heart is mildly enlarged with pulmonary vascularity normal. No adenopathy. Patient is status post median sternotomy. A stent is noted in the left brachial region. IMPRESSION: Persistent airspace consolidation felt to represent a degree of pneumonia in the left base with small left pleural effusion. Right lung clear. Stable cardiac prominence. Postoperative changes noted. Electronically Signed   By: Lowella Grip III M.D.   On: 11/13/2018 10:48     ASSESSMENT AND PLAN:   68 yo male w/ hx of ESRD on hemodialysis, diabetes, hypertension, GERD, chronic diastolic CHF who presented to the hospital due to shortness of breath and generalized weakness.  1.  Shortness of breath/generalized  weakness-secondary to suspected pneumonia.  Patient is not hypoxic although CT chest showed mild left lower opacity secondary to suspected pneumonia. -Continue empiric ceftriaxone, Zithromax.  Follow cultures.    2.  Pneumonia-suspected to be committee acquired pneumonia. - Continue ceftriaxone, Zithromax.  Cultures remain negative.  Procalcitonin level was normal.  3.  End-stage renal disease on hemodialysis-patient gets dialysis on Tuesday Thursday Saturday.  Patient received dialysis yesterday. -Nephrology following.  No acute need for dialysis today.  4.  Hyperlipidemia-continue atorvastatin.  5.  Hx of seizures-no acute seizure activity.  Continue Keppra.  6.  Essential hypertension-continue Toprol, losartan.  7.  Secondary hyperparathyroidism-continue PhosLo.  8. Hx of Gout - cont. Allopurinol.   Attempted to call family for update but no answer.   All the records are reviewed and case discussed with Care Management/Social Worker. Management plans discussed with the patient, family and they are in agreement.  CODE STATUS: Full cod  DVT Prophylaxis: Hep SQ  TOTAL TIME TAKING CARE OF THIS PATIENT: 30 minutes.   POSSIBLE D/C IN 1-2 DAYS, DEPENDING ON CLINICAL CONDITION.   Henreitta Leber M.D on 11/14/2018 at 3:02 PM  Between 7am to 6pm - Pager - [phone]  After 6pm go to [email or web address] - Proofreader  [facility] [place] [facility]  Office  [phone]  CC: Primary care physician; [name], MD

## 2018-11-14 NOTE — Progress Notes (Signed)
PT Cancellation Note  Patient Details Name: [name] MRN: [id number] DOB: 1951-02-23   Cancelled Treatment:    Reason Eval/Treat Not Completed: Patient's level of consciousness(RN reports patient is altered this morning, not responding appropriately to staff and 'combative' at times. Will evaluated at later date/time once appropriate. ) 10:33 AM, 11/14/18 Etta Grandchild, PT, DPT Physical Therapist - [facility]  [phone] ([place])    Vickery C 11/14/2018, 10:33 AM

## 2018-11-14 NOTE — Progress Notes (Signed)
[facility]  ROUNDING NOTE   Subjective:   Hemodialysis treatment yesterday. Tolerated treatment well. UF of 0.5 liter.   Patient does not want to be bothered this morning  Objective:  Vital signs in last 24 hours:  Temp:  [97.7 F (36.5 C)-98.6 F (37 C)] 98.3 F (36.8 C) (05/30 0417) Pulse Rate:  [60-87] 87 (05/30 0417) Resp:  [15-22] 19 (05/30 0417) BP: (110-172)/(67-83) 166/81 (05/30 0417) SpO2:  [95 %-100 %] 98 % (05/30 0417) Weight:  [69.9 kg-70.3 kg] 69.9 kg (05/29 2315)  Weight change:  Filed Weights   11/13/18 1021 11/13/18 2015 11/13/18 2315  Weight: 70.3 kg 70.3 kg 69.9 kg    Intake/Output: I/O last 3 completed shifts: In: -  Out: 500 [Other:500]   Intake/Output this shift:  No intake/output data recorded.  Physical Exam: General: NAD, laying in bed  Head: Normocephalic, atraumatic. Moist oral mucosal membranes  Eyes: Anicteric, PERRL  Neck: Supple, trachea midline  Lungs:  Bilateral rhonchi  Heart: Regular rate and rhythm  Abdomen:  Soft, nontender,   Extremities: no peripheral edema.  Neurologic: Nonfocal, moving all four extremities  Skin: No lesions  Access: Left AVG    Basic Metabolic Panel: Recent Labs  Lab 11/13/18 1052  NA 140  K 4.6  CL 97*  CO2 28  GLUCOSE 110*  BUN 46*  CREATININE 9.19*  CALCIUM 8.8*    Liver Function Tests: Recent Labs  Lab 11/13/18 1052  AST 19  ALT 12  ALKPHOS 104  BILITOT 0.7  PROT 8.1  ALBUMIN 3.7   No results for input(s): LIPASE, AMYLASE in the last 168 hours. No results for input(s): AMMONIA in the last 168 hours.  CBC: Recent Labs  Lab 11/13/18 1052  WBC 11.6*  NEUTROABS 8.2*  HGB 11.6*  HCT 37.2*  MCV 74.8*  PLT 209    Cardiac Enzymes: Recent Labs  Lab 11/13/18 1052 11/13/18 1312 11/13/18 1806  TROPONINI 0.03* 0.03* 0.03*    BNP: Invalid input(s): POCBNP  CBG: No results for input(s): GLUCAP in the last 168 hours.  Microbiology: Results for orders  placed or performed during the hospital encounter of 11/13/18  Culture, blood (routine x 2) Call MD if unable to obtain prior to antibiotics being given     Status: None (Preliminary result)   Collection Time: 11/13/18 10:53 AM  Result Value Ref Range Status   Specimen Description BLOOD R HAND  Final   Special Requests   Final    BOTTLES DRAWN AEROBIC AND ANAEROBIC Blood Culture adequate volume   Culture   Final    NO GROWTH < 24 HOURS Performed at [facility], [address] [place], [place] [address]    Report Status PENDING  Incomplete  SARS Coronavirus 2 (CEPHEID - Performed in [place] hospital lab), Hosp Order     Status: None   Collection Time: 11/13/18 11:12 AM  Result Value Ref Range Status   SARS Coronavirus 2 NEGATIVE NEGATIVE Final    Comment: (NOTE) If result is NEGATIVE SARS-CoV-2 target nucleic acids are NOT DETECTED. The SARS-CoV-2 RNA is generally detectable in upper and lower  respiratory specimens during the acute phase of infection. The lowest  concentration of SARS-CoV-2 viral copies this assay can detect is 250  copies / mL. A negative result does not preclude SARS-CoV-2 infection  and should not be used as the sole basis for treatment or other  patient management decisions.  A negative result may occur with  improper  specimen collection / handling, submission of specimen other  than nasopharyngeal swab, presence of viral mutation(s) within the  areas targeted by this assay, and inadequate number of viral copies  (<250 copies / mL). A negative result must be combined with clinical  observations, patient history, and epidemiological information. If result is POSITIVE SARS-CoV-2 target nucleic acids are DETECTED. The SARS-CoV-2 RNA is generally detectable in upper and lower  respiratory specimens dur ing the acute phase of infection.  Positive  results are indicative of active infection with SARS-CoV-2.  Clinical  correlation with patient history  and other diagnostic information is  necessary to determine patient infection status.  Positive results do  not rule out bacterial infection or co-infection with other viruses. If result is PRESUMPTIVE POSTIVE SARS-CoV-2 nucleic acids MAY BE PRESENT.   A presumptive positive result was obtained on the submitted specimen  and confirmed on repeat testing.  While 2019 novel coronavirus  (SARS-CoV-2) nucleic acids may be present in the submitted sample  additional confirmatory testing may be necessary for epidemiological  and / or clinical management purposes  to differentiate between  SARS-CoV-2 and other Sarbecovirus currently known to infect humans.  If clinically indicated additional testing with an alternate test  methodology [phone]) is advised. The SARS-CoV-2 RNA is generally  detectable in upper and lower respiratory sp ecimens during the acute  phase of infection. The expected result is Negative. Fact Sheet for Patients:  StrictlyIdeas.no Fact Sheet for Healthcare Providers: BankingDealers.co.za This test is not yet approved or cleared by the [place] FDA and has been authorized for detection and/or diagnosis of SARS-CoV-2 by FDA under an Emergency Use Authorization (EUA).  This EUA will remain in effect (meaning this test can be used) for the duration of the COVID-19 declaration under Section 564(b)(1) of the Act, 21 U.S.C. section 360bbb-3(b)(1), unless the authorization is terminated or revoked sooner. Performed at [facility], [address] [place], [place] [address]     Coagulation Studies: No results for input(s): LABPROT, INR in the last 72 hours.  Urinalysis: No results for input(s): COLORURINE, LABSPEC, PHURINE, GLUCOSEU, HGBUR, BILIRUBINUR, KETONESUR, PROTEINUR, UROBILINOGEN, NITRITE, LEUKOCYTESUR in the last 72 hours.  Invalid input(s): APPERANCEUR    Imaging: Ct Head Wo Contrast  Result Date:  11/13/2018 CLINICAL DATA:  Dialysis patient.  Cough and weakness. EXAM: CT HEAD WITHOUT CONTRAST TECHNIQUE: Contiguous axial images were obtained from the base of the skull through the vertex without intravenous contrast. COMPARISON:  02/19/2018 FINDINGS: Brain: Generalized atrophy. Chronic small-vessel ischemic changes affecting the cerebral hemispheric white matter. 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 No sign of acute infarction, mass lesion, hemorrhage, hydrocephalus or extra-axial collection. Vascular: There is atherosclerotic calcification of the major vessels at the base of the brain. Skull: Normal Sinuses/Orbits: Clear/normal Other: None IMPRESSION: No acute finding by CT. Atrophy and chronic small-vessel ischemic changes. 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 Electronically Signed   By: Nelson Chimes M.D.   On: 11/13/2018 17:01   Ct Chest Wo Contrast  Result Date: 11/13/2018 CLINICAL DATA:  Cough. EXAM: CT CHEST WITHOUT CONTRAST TECHNIQUE: Multidetector CT imaging of the chest was performed following the standard protocol without IV contrast. COMPARISON:  Radiograph of same day.  CT scan of September 04, 2016. FINDINGS: Cardiovascular: Atherosclerosis of thoracic aorta is noted without aneurysm formation. Normal cardiac size. Status post coronary bypass graft. No pericardial effusion is noted. Mediastinum/Nodes: No enlarged mediastinal or axillary lymph nodes.  Thyroid gland, trachea, and esophagus demonstrate no significant findings. Lungs/Pleura: No pneumothorax or pleural effusion is noted. Ground-glass opacity seen in right upper lobe on prior exam is no longer visualized. Left lower lobe subsegmental atelectasis, infiltrate or scarring is noted. Upper Abdomen: No acute abnormality. 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 No focal abnormality is noted. IMPRESSION: Mild left  lower lobe opacity is noted which may represent subsegmental atelectasis, infiltrate or possibly scarring. Ground-glass opacity seen in right upper lobe on prior exam is no longer present. Aortic Atherosclerosis (ICD10-I70.0). Electronically Signed   By: Marijo Conception M.D.   On: 11/13/2018 13:34   Dg Chest Portable 1 View  Result Date: 11/13/2018 CLINICAL DATA:  Cough and weakness EXAM: PORTABLE CHEST 1 VIEW COMPARISON:  September 29, 2018 FINDINGS: There is persistent airspace consolidation in the left base with small left pleural effusion. Lungs elsewhere are clear. Heart is mildly enlarged with pulmonary vascularity normal. No adenopathy. Patient is status post median sternotomy. A stent is noted in the left brachial region. IMPRESSION: Persistent airspace consolidation felt to represent a degree of pneumonia in the left base with small left pleural effusion. Right lung clear. Stable cardiac prominence. Postoperative changes noted. Electronically Signed   By: Lowella Grip III M.D.   On: 11/13/2018 10:48     Medications:   . azithromycin    . cefTRIAXone (ROCEPHIN)  IV     . allopurinol  100 mg Oral Daily  . aspirin EC  81 mg Oral Daily  . atorvastatin  80 mg Oral Daily  . calcium acetate  1,334 mg Oral TID WC  . Chlorhexidine Gluconate Cloth  6 each Topical Q0600  . clopidogrel  75 mg Oral Q breakfast  . feeding supplement (NEPRO CARB STEADY)  237 mL Oral BID BM  . heparin  5,000 Units Subcutaneous Q8H  . levETIRAcetam  750 mg Oral Daily  . losartan  25 mg Oral Daily  . metoprolol succinate  12.5-25 mg Oral See admin instructions  . mirtazapine  15 mg Oral QHS  . multivitamin  1 tablet Oral QHS     Assessment/ Plan:  [name] is a 68 y.o. [place]) male with end stage renal disease on hemodialysis, failed renal transplant, hypertension, diabetes mellitus type II, GERD, diastolic congestive heart failure admitted to [facility] on 11/13/2018 for pneumonia  [facility]  [address] TTS 72kg Left AVG  1. End Stage Renal Disease: hemodialysis treatment yesterday. Tolerated treatment well.  Monitor daily for dialysis need  2. Hypertension:   metoprolol and losartan Regularly gets midodrine prior to dialysis treatments.   3. Anemia of chronic kidney disease: Hemoglobin 11.6. No indication for EPO.   4. Secondary Hyperparathyroidism: outpatient labs 5/12 PTH 455, phos 4.7, calcium 8.1 - calcium acetate with meals.   5. Pneumonia - azithromycin and ceftriaxone empirically.    LOS: 1 Abryanna Musolino 5/30/202012:29 PM

## 2018-11-15 LAB — CBC
HCT: 34.4 % — ABNORMAL LOW (ref 39.0–52.0)
Hemoglobin: 10.6 g/dL — ABNORMAL LOW (ref 13.0–17.0)
MCH: 23 pg — ABNORMAL LOW (ref 26.0–34.0)
MCHC: 30.8 g/dL (ref 30.0–36.0)
MCV: 74.8 fL — ABNORMAL LOW (ref 80.0–100.0)
Platelets: 204 10*3/uL (ref 150–400)
RBC: 4.6 MIL/uL (ref 4.22–5.81)
RDW: 15.9 % — ABNORMAL HIGH (ref 11.5–15.5)
WBC: 6.4 10*3/uL (ref 4.0–10.5)
nRBC: 0 % (ref 0.0–0.2)

## 2018-11-15 LAB — HIV ANTIBODY (ROUTINE TESTING W REFLEX): HIV Screen 4th Generation wRfx: NONREACTIVE

## 2018-11-15 MED ORDER — DOXYCYCLINE HYCLATE 100 MG PO CAPS: 100.0000 mg | ORAL_CAPSULE | Freq: Two times a day (BID) | ORAL | 0 refills | Status: AC

## 2018-11-15 NOTE — Progress Notes (Signed)
[facility]  ROUNDING NOTE   Subjective:   Feeling better this morning. Wants to go home.   Objective:  Vital signs in last 24 hours:  Temp:  [97.8 F (36.6 C)-98.2 F (36.8 C)] 97.8 F (36.6 C) (05/31 0941) Pulse Rate:  [66-71] 71 (05/31 0941) Resp:  [16] 16 (05/31 0443) BP: (155-166)/(76-83) 160/83 (05/31 0941) SpO2:  [96 %-100 %] 96 % (05/31 0941) Weight:  [71.1 kg] 71.1 kg (05/31 0746)  Weight change:  Filed Weights   11/13/18 2015 11/13/18 2315 11/15/18 0746  Weight: 70.3 kg 69.9 kg 71.1 kg    Intake/Output: I/O last 3 completed shifts: In: 250 [IV Piggyback:250] Out: 500 [Other:500]   Intake/Output this shift:  No intake/output data recorded.  Physical Exam: General: NAD, laying in bed  Head: Normocephalic, atraumatic. Moist oral mucosal membranes  Eyes: Anicteric, PERRL  Neck: Supple   Lungs:  Bilateral rhonchi R > L  Heart: Regular rate and rhythm  Abdomen:  Soft, nontender,   Extremities: no peripheral edema.  Neurologic: Nonfocal, moving all four extremities  Skin: No lesions  Access: Left AVG    Basic Metabolic Panel: Recent Labs  Lab 11/13/18 1052  NA 140  K 4.6  CL 97*  CO2 28  GLUCOSE 110*  BUN 46*  CREATININE 9.19*  CALCIUM 8.8*    Liver Function Tests: Recent Labs  Lab 11/13/18 1052  AST 19  ALT 12  ALKPHOS 104  BILITOT 0.7  PROT 8.1  ALBUMIN 3.7   No results for input(s): LIPASE, AMYLASE in the last 168 hours. No results for input(s): AMMONIA in the last 168 hours.  CBC: Recent Labs  Lab 11/13/18 1052 11/15/18 0453  WBC 11.6* 6.4  NEUTROABS 8.2*  --   HGB 11.6* 10.6*  HCT 37.2* 34.4*  MCV 74.8* 74.8*  PLT 209 204    Cardiac Enzymes: Recent Labs  Lab 11/13/18 1052 11/13/18 1312 11/13/18 1806  TROPONINI 0.03* 0.03* 0.03*    BNP: Invalid input(s): POCBNP  CBG: No results for input(s): GLUCAP in the last 168 hours.  Microbiology: Results for orders placed or performed during the hospital  encounter of 11/13/18  Culture, blood (routine x 2) Call MD if unable to obtain prior to antibiotics being given     Status: None (Preliminary result)   Collection Time: 11/13/18 10:53 AM  Result Value Ref Range Status   Specimen Description BLOOD R HAND  Final   Special Requests   Final    BOTTLES DRAWN AEROBIC AND ANAEROBIC Blood Culture adequate volume   Culture   Final    NO GROWTH 2 DAYS Performed at [facility], [address] [place], [place] [address]    Report Status PENDING  Incomplete  SARS Coronavirus 2 (CEPHEID - Performed in [place] hospital lab), Hosp Order     Status: None   Collection Time: 11/13/18 11:12 AM  Result Value Ref Range Status   SARS Coronavirus 2 NEGATIVE NEGATIVE Final    Comment: (NOTE) If result is NEGATIVE SARS-CoV-2 target nucleic acids are NOT DETECTED. The SARS-CoV-2 RNA is generally detectable in upper and lower  respiratory specimens during the acute phase of infection. The lowest  concentration of SARS-CoV-2 viral copies this assay can detect is 250  copies / mL. A negative result does not preclude SARS-CoV-2 infection  and should not be used as the sole basis for treatment or other  patient management decisions.  A negative result may occur with  improper specimen  collection / handling, submission of specimen other  than nasopharyngeal swab, presence of viral mutation(s) within the  areas targeted by this assay, and inadequate number of viral copies  (<250 copies / mL). A negative result must be combined with clinical  observations, patient history, and epidemiological information. If result is POSITIVE SARS-CoV-2 target nucleic acids are DETECTED. The SARS-CoV-2 RNA is generally detectable in upper and lower  respiratory specimens dur ing the acute phase of infection.  Positive  results are indicative of active infection with SARS-CoV-2.  Clinical  correlation with patient history and other diagnostic information is   necessary to determine patient infection status.  Positive results do  not rule out bacterial infection or co-infection with other viruses. If result is PRESUMPTIVE POSTIVE SARS-CoV-2 nucleic acids MAY BE PRESENT.   A presumptive positive result was obtained on the submitted specimen  and confirmed on repeat testing.  While 2019 novel coronavirus  (SARS-CoV-2) nucleic acids may be present in the submitted sample  additional confirmatory testing may be necessary for epidemiological  and / or clinical management purposes  to differentiate between  SARS-CoV-2 and other Sarbecovirus currently known to infect humans.  If clinically indicated additional testing with an alternate test  methodology [phone]) is advised. The SARS-CoV-2 RNA is generally  detectable in upper and lower respiratory sp ecimens during the acute  phase of infection. The expected result is Negative. Fact Sheet for Patients:  StrictlyIdeas.no Fact Sheet for Healthcare Providers: BankingDealers.co.za This test is not yet approved or cleared by the [place] FDA and has been authorized for detection and/or diagnosis of SARS-CoV-2 by FDA under an Emergency Use Authorization (EUA).  This EUA will remain in effect (meaning this test can be used) for the duration of the COVID-19 declaration under Section 564(b)(1) of the Act, 21 U.S.C. section 360bbb-3(b)(1), unless the authorization is terminated or revoked sooner. Performed at [facility], [address] [place], [place] [address]     Coagulation Studies: No results for input(s): LABPROT, INR in the last 72 hours.  Urinalysis: No results for input(s): COLORURINE, LABSPEC, PHURINE, GLUCOSEU, HGBUR, BILIRUBINUR, KETONESUR, PROTEINUR, UROBILINOGEN, NITRITE, LEUKOCYTESUR in the last 72 hours.  Invalid input(s): APPERANCEUR    Imaging: Ct Head Wo Contrast  Result Date: 11/13/2018 CLINICAL DATA:  Dialysis  patient.  Cough and weakness. EXAM: CT HEAD WITHOUT CONTRAST TECHNIQUE: Contiguous axial images were obtained from the base of the skull through the vertex without intravenous contrast. COMPARISON:  02/19/2018 FINDINGS: Brain: Generalized atrophy. Chronic small-vessel ischemic changes affecting the cerebral hemispheric white matter. 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 No sign of acute infarction, mass lesion, hemorrhage, hydrocephalus or extra-axial collection. Vascular: There is atherosclerotic calcification of the major vessels at the base of the brain. Skull: Normal Sinuses/Orbits: Clear/normal Other: None IMPRESSION: No acute finding by CT. Atrophy and chronic small-vessel ischemic changes. 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 Electronically Signed   By: Nelson Chimes M.D.   On: 11/13/2018 17:01     Medications:   . azithromycin 500 mg (11/14/18 1540)  . cefTRIAXone (ROCEPHIN)  IV 1 g (11/14/18 1640)   . allopurinol  100 mg Oral Daily  . aspirin EC  81 mg Oral Daily  . atorvastatin  80 mg Oral Daily  . calcium acetate  1,334 mg Oral TID WC  . Chlorhexidine Gluconate Cloth  6 each Topical Q0600  . clopidogrel  75 mg Oral Q breakfast  . feeding  supplement (NEPRO CARB STEADY)  237 mL Oral BID BM  . heparin  5,000 Units Subcutaneous Q8H  . levETIRAcetam  750 mg Oral Daily  . losartan  25 mg Oral Daily  . metoprolol succinate  12.5-25 mg Oral See admin instructions  . mirtazapine  15 mg Oral QHS  . multivitamin  1 tablet Oral QHS     Assessment/ Plan:  [name] is a 68 y.o. [place]) male with end stage renal disease on hemodialysis, failed renal transplant, hypertension, diabetes mellitus type II, GERD, diastolic congestive heart failure admitted to [facility] on 11/13/2018 for pneumonia  [facility] [address] TTS 72kg Left AVG  1. End Stage Renal Disease: hemodialysis treatment on Friday.  Tolerated treatment well.  Next treatment for Tuesday.   2. Hypertension:   metoprolol and losartan Regularly gets midodrine prior to dialysis treatments.   3. Anemia of chronic kidney disease: Hemoglobin 10.6. No indication for EPO.   4. Secondary Hyperparathyroidism: outpatient labs 5/12 PTH 455, phos 4.7, calcium 8.1 - calcium acetate with meals.   5. Pneumonia - PO levofloxacin   LOS: 2 [name] 5/31/20201:20 PM

## 2018-11-15 NOTE — Discharge Summary (Signed)
[facility] NAME: [name]    MR#:  [id number]  DATE OF BIRTH:  [birth year]  DATE OF ADMISSION:  11/13/2018 ADMITTING PHYSICIAN: Sela Hua, MD  DATE OF DISCHARGE: 11/15/2018 12:50 PM  PRIMARY CARE PHYSICIAN: [name], MD    ADMISSION DIAGNOSIS:  Weakness [R53.1] Community acquired pneumonia of left lung, unspecified part of lung [J18.9]  DISCHARGE DIAGNOSIS:  Active Problems:   Community acquired pneumonia   SECONDARY DIAGNOSIS:   Past Medical History:  Diagnosis Date  . Chronic diastolic congestive heart failure ([place])   . Chronic disease anemia   . ESRD (end stage renal disease) on dialysis ([place])    "[place]; [place]; [name]" (09/29/2014)  . GERD (gastroesophageal reflux disease)   . Gout   . High cholesterol   . History of blood transfusion    "related to anemia"  . History of stomach ulcers   . Hypertension   . Type II diabetes mellitus [facility])     HOSPITAL COURSE:   68 yo male w/ hx of ESRD on hemodialysis, diabetes, hypertension, GERD, chronic diastolic CHF who presented to the hospital due to shortness of breath and generalized weakness.  1.  Shortness of breath/generalized weakness-secondary to suspected pneumonia.  Patient was not hypoxic although CT chest showed mild left lower opacity secondary to suspected pneumonia. Patient was admitted to the hospital and treated empirically with IV ceftriaxone, Zithromax.  Patient remains afebrile and is no longer hypoxic. -She will be discharged home on oral doxycycline for 5 more days.   2.  Pneumonia-suspected to be community acquired pneumonia. -Patient was treated with IV ceftriaxone, Zithromax.  Procalcitonin level were normal.  He is being discharged on oral doxycycline for a few more days.  3.  End-stage renal disease on hemodialysis-patient gets dialysis on Tuesday Thursday Saturday. -Patient was dialyzed once in the hospital on Friday.  He did not  receive his scheduled dialysis on Saturday.  He will resume his dialysis on the schedule as mentioned above.  No acute indication for dialysis upon the day of discharge.  4.  Hyperlipidemia- pt. Will continue atorvastatin.  5.  Hx of seizures-no acute seizure activity.  pt. Will Continue Keppra.  6.  Essential hypertension- pt. Will continue Toprol, losartan.  7.  Secondary hyperparathyroidism-pt. Will continue PhosLo.  8. Hx of Gout - cont. Allopurinol.  - no acute attack.   DISCHARGE CONDITIONS:   Stable.   CONSULTS OBTAINED:    DRUG ALLERGIES:   Allergies  Allergen Reactions  . Ivp Dye [Iodinated Diagnostic Agents] Other (See Comments)    Pt denied    DISCHARGE MEDICATIONS:   Allergies as of 11/15/2018      Reactions   Ivp Dye [iodinated Diagnostic Agents] Other (See Comments)   Pt denied      Medication List    STOP taking these medications   hydrALAZINE 25 MG tablet Commonly known as:  APRESOLINE     TAKE these medications   allopurinol 100 MG tablet Commonly known as:  ZYLOPRIM Take 100 mg by mouth daily.   ascorbic acid 250 MG tablet Commonly known as:  VITAMIN C Take 1 tablet (250 mg total) by mouth 2 (two) times daily.   aspirin 81 MG EC tablet Take 81 mg by mouth daily.   atorvastatin 80 MG tablet Commonly known as:  LIPITOR Take 80 mg by mouth daily.   calcium acetate 667 MG capsule Commonly known as:  PHOSLO Take 1,334  mg by mouth 3 (three) times daily with meals.   clopidogrel 75 MG tablet Commonly known as:  PLAVIX Take 1 tablet (75 mg total) by mouth daily with breakfast.   doxycycline 100 MG capsule Commonly known as:  VIBRAMYCIN Take 1 capsule (100 mg total) by mouth 2 (two) times daily for 5 days.   feeding supplement (NEPRO CARB STEADY) Liqd Take 237 mLs by mouth 2 (two) times daily between meals.   levETIRAcetam 750 MG tablet Commonly known as:  KEPPRA Take 750 mg by mouth daily. What changed:  Another medication  with the same name was removed. Continue taking this medication, and follow the directions you see here.   losartan 25 MG tablet Commonly known as:  COZAAR Take 1 tablet (25 mg total) by mouth daily.   metoprolol succinate 25 MG 24 hr tablet Commonly known as:  TOPROL-XL Take 12.5-25 mg by mouth See admin instructions. Take 25 mg by mouth on non-dialysis days and 12.5 mg on dialysis days (Tuesday, Thursday and Saturday) after dialysis.   mirtazapine 15 MG tablet Commonly known as:  REMERON Take 15 mg by mouth at bedtime.   multivitamin Tabs tablet Take 1 tablet by mouth at bedtime.   nitroGLYCERIN 0.4 MG SL tablet Commonly known as:  NITROSTAT Place 1 tablet (0.4 mg total) under the tongue every 5 (five) minutes x 3 doses as needed for chest pain.         DISCHARGE INSTRUCTIONS:   DIET:  Cardiac diet and Renal diet  DISCHARGE CONDITION:  Stable  ACTIVITY:  Activity as tolerated  OXYGEN:  Home Oxygen: No.   Oxygen Delivery: room air  DISCHARGE LOCATION:  home   If you experience worsening of your admission symptoms, develop shortness of breath, life threatening emergency, suicidal or homicidal thoughts you must seek medical attention immediately by calling 911 or calling your MD immediately  if symptoms less severe.  You Must read complete instructions/literature along with all the possible adverse reactions/side effects for all the Medicines you take and that have been prescribed to you. Take any new Medicines after you have completely understood and accpet all the possible adverse reactions/side effects.   Please note  You were cared for by a hospitalist during your hospital stay. If you have any questions about your discharge medications or the care you received while you were in the hospital after you are discharged, you can call the unit and asked to speak with the hospitalist on call if the hospitalist that took care of you is not available. Once you are  discharged, your primary care physician will handle any further medical issues. Please note that NO REFILLS for any discharge medications will be authorized once you are discharged, as it is imperative that you return to your primary care physician (or establish a relationship with a primary care physician if you do not have one) for your aftercare needs so that they can reassess your need for medications and monitor your lab values.     Today   It is more awake today.  No other acute events overnight.  Afebrile, hemodynamically stable.  Denies any respiratory symptoms.  Wants to go home.  VITAL SIGNS:  Blood pressure (!) 160/83, pulse 71, temperature 97.8 F (36.6 C), temperature source Oral, resp. rate 16, height 5\' 1"  (1.549 m), weight 71.1 kg, SpO2 96 %.  I/O:    Intake/Output Summary (Last 24 hours) at 11/15/2018 1546 Last data filed at 11/15/2018 0958 Gross per 24 hour  Intake 250 ml  Output -  Net 250 ml    PHYSICAL EXAMINATION:   GENERAL:  68 y.o.-year-old patient lying in bed lethargic/Encephalopathic.  EYES: Pupils equal, round, reactive to light and accommodation. No scleral icterus. Extraocular muscles intact.  HEENT: Head atraumatic, normocephalic. Oropharynx and nasopharynx clear.  NECK:  Supple, no jugular venous distention. No thyroid enlargement, no tenderness.  LUNGS: Normal breath sounds bilaterally, no wheezing, rales, rhonchi. No use of accessory muscles of respiration.  CARDIOVASCULAR: S1, S2 normal. II-III/VI SEM at LSB, No rubs, or gallops.  ABDOMEN: Soft, nontender, nondistended. Bowel sounds present. No organomegaly or mass.  EXTREMITIES: No cyanosis, clubbing or edema b/l.    NEUROLOGIC: Cranial nerves II through XII are intact. No focal Motor or sensory deficits. Globally weak.  PSYCHIATRIC: The patient is alert and oriented x 1.  SKIN: No obvious rash, lesion, or ulcer.   DATA REVIEW:   CBC Recent Labs  Lab 11/15/18 0453  WBC 6.4  HGB 10.6*   HCT 34.4*  PLT 204    Chemistries  Recent Labs  Lab 11/13/18 1052  NA 140  K 4.6  CL 97*  CO2 28  GLUCOSE 110*  BUN 46*  CREATININE 9.19*  CALCIUM 8.8*  AST 19  ALT 12  ALKPHOS 104  BILITOT 0.7    Cardiac Enzymes Recent Labs  Lab 11/13/18 1806  TROPONINI 0.03*    Microbiology Results  Results for orders placed or performed during the hospital encounter of 11/13/18  Culture, blood (routine x 2) Call MD if unable to obtain prior to antibiotics being given     Status: None (Preliminary result)   Collection Time: 11/13/18 10:53 AM  Result Value Ref Range Status   Specimen Description BLOOD R HAND  Final   Special Requests   Final    BOTTLES DRAWN AEROBIC AND ANAEROBIC Blood Culture adequate volume   Culture   Final    NO GROWTH 2 DAYS Performed at [facility], [address], [place], [place] [address]    Report Status PENDING  Incomplete  SARS Coronavirus 2 (CEPHEID - Performed in [place] hospital lab), Hosp Order     Status: None   Collection Time: 11/13/18 11:12 AM  Result Value Ref Range Status   SARS Coronavirus 2 NEGATIVE NEGATIVE Final    Comment: (NOTE) If result is NEGATIVE SARS-CoV-2 target nucleic acids are NOT DETECTED. The SARS-CoV-2 RNA is generally detectable in upper and lower  respiratory specimens during the acute phase of infection. The lowest  concentration of SARS-CoV-2 viral copies this assay can detect is 250  copies / mL. A negative result does not preclude SARS-CoV-2 infection  and should not be used as the sole basis for treatment or other  patient management decisions.  A negative result may occur with  improper specimen collection / handling, submission of specimen other  than nasopharyngeal swab, presence of viral mutation(s) within the  areas targeted by this assay, and inadequate number of viral copies  (<250 copies / mL). A negative result must be combined with clinical  observations, patient history, and  epidemiological information. If result is POSITIVE SARS-CoV-2 target nucleic acids are DETECTED. The SARS-CoV-2 RNA is generally detectable in upper and lower  respiratory specimens dur ing the acute phase of infection.  Positive  results are indicative of active infection with SARS-CoV-2.  Clinical  correlation with patient history and other diagnostic information is  necessary to determine patient infection status.  Positive results do  not rule out bacterial infection or co-infection with other viruses. If result is PRESUMPTIVE POSTIVE SARS-CoV-2 nucleic acids MAY BE PRESENT.   A presumptive positive result was obtained on the submitted specimen  and confirmed on repeat testing.  While 2019 novel coronavirus  (SARS-CoV-2) nucleic acids may be present in the submitted sample  additional confirmatory testing may be necessary for epidemiological  and / or clinical management purposes  to differentiate between  SARS-CoV-2 and other Sarbecovirus currently known to infect humans.  If clinically indicated additional testing with an alternate test  methodology [id number]) is advised. The SARS-CoV-2 RNA is generally  detectable in upper and lower respiratory sp ecimens during the acute  phase of infection. The expected result is Negative. Fact Sheet for Patients:  StrictlyIdeas.no Fact Sheet for Healthcare Providers: BankingDealers.co.za This test is not yet approved or cleared by the [place] FDA and has been authorized for detection and/or diagnosis of SARS-CoV-2 by FDA under an Emergency Use Authorization (EUA).  This EUA will remain in effect (meaning this test can be used) for the duration of the COVID-19 declaration under Section 564(b)(1) of the Act, 21 U.S.C. section 360bbb-3(b)(1), unless the authorization is terminated or revoked sooner. Performed at [facility], [place], [place] [address]      RADIOLOGY:  Ct Head Wo Contrast  Result Date: 11/13/2018 CLINICAL DATA:  Dialysis patient.  Cough and weakness. EXAM: CT HEAD WITHOUT CONTRAST TECHNIQUE: Contiguous axial images were obtained from the base of the skull through the vertex without intravenous contrast. COMPARISON:  02/19/2018 FINDINGS: Brain: Generalized atrophy. Chronic small-vessel ischemic changes affecting the cerebral hemispheric white matter. 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 No sign of acute infarction, mass lesion, hemorrhage, hydrocephalus or extra-axial collection. Vascular: There is atherosclerotic calcification of the major vessels at the base of the brain. Skull: Normal Sinuses/Orbits: Clear/normal Other: None IMPRESSION: No acute finding by CT. Atrophy and chronic small-vessel ischemic changes. 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 Electronically Signed   By: Nelson Chimes M.D.   On: 11/13/2018 17:01      Management plans discussed with the patient, family and they are in agreement.  CODE STATUS:     Code Status Orders  (From admission, onward)         Start     Ordered   11/13/18 1743  Full code  Continuous     11/13/18 1742       TOTAL TIME TAKING CARE OF THIS PATIENT: 40 minutes.    Henreitta Leber M.D on 11/15/2018 at 3:46 PM  Between 7am to 6pm - Pager - [phone]  After 6pm go to [email or web address] - Proofreader  [facility]  Office  [phone]  CC: Primary care physician; [name], MD

## 2018-11-15 NOTE — Evaluation (Addendum)
Physical Therapy Evaluation Patient Details Name: [name] MRN: [id number] DOB: 10-27-1950 Today's Date: 11/15/2018   History of Present Illness  [name] is a 68yo male (loatian speaking), who comes to [facility] on 8/[facility] c diastolic congestive heart failure, ESRD on HD TTS, hyperlipidemia, hypertension, type 2 diabetes. Pt admitted c CAP.    Clinical Impression  Pt admitted with above diagnosis. Pt currently with functional limitations due to the deficits listed below (see "PT Problem List"). Upon entry, pt in bed and agreeable to participate. Reports to feel week. History obtained from Son over phone. ModA req to EOB and to stand, MinGuard for AMB of 57ft. Pt has fluctuating functional status PTA, and at present he is at the lower end of that range. Family has provided mod-maxA c WC in the past. Functional mobility assessment demonstrates increased effort/time requirements, poor tolerance, and need for physical assistance, whereas the patient performed these at a higher level of independence PTA. Pt will benefit from skilled PT intervention to increase independence and safety with basic mobility in preparation for discharge to the venue listed below.       Follow Up Recommendations Home health PT;Supervision for mobility/OOB    Equipment Recommendations  None recommended by PT    Recommendations for Other Services       Precautions / Restrictions Precautions Precautions: Fall Restrictions Weight Bearing Restrictions: No      Mobility  Bed Mobility Overal bed mobility: Needs Assistance Bed Mobility: Supine to Sit;Sit to Supine     Supine to sit: Mod assist(assist for trunk flexion) Sit to supine: Min guard   General bed mobility comments: able to sit self supported for short periods   Transfers Overall transfer level: Needs assistance Equipment used: Rolling walker (2 wheeled);1 person hand held assist Transfers: Sit to/from Stand Sit to Stand: Mod assist;Min assist          General transfer comment: modA inititally, but improved to minA by the 4th time  Ambulation/Gait Ambulation/Gait assistance: Min guard Gait Distance (Feet): 12 Feet Assistive device: Rolling walker (2 wheeled) Gait Pattern/deviations: Step-to pattern;Ataxic     General Gait Details: gait instablity, but no large amplitude LOB, pt corrects LOB without physical assist   Stairs            Wheelchair Mobility    Modified Rankin (Stroke Patients Only)       Balance Overall balance assessment: Needs assistance Sitting-balance support: Single extremity supported;Feet supported Sitting balance-Leahy Scale: Fair     Standing balance support: Bilateral upper extremity supported;During functional activity Standing balance-Leahy Scale: Poor                               Pertinent Vitals/Pain Pain Assessment: No/denies pain    Home Living Family/patient expects to be discharged to:: Private residence Living Arrangements: Spouse/significant other;Children(Wife, Son, DTR) Available Help at Discharge: Family;Available 24 hours/day Type of Home: House Home Access: Stairs to enter Entrance Stairs-Rails: Chemical engineer of Steps: 4 (2 in front without rail)  Home Layout: One level Home Equipment: Walker - 2 wheels;Shower seat;Wheelchair - manual;Cane - single point Additional Comments: From son over phone    Prior Function Level of Independence: Needs assistance   Gait / Transfers Assistance Needed: fluctuating: household AMB c supervision and or WC c family pushing  ADL's / Homemaking Assistance Needed: fluctuating: typically needs assistance for bathing/dressing; set-up to BR where t performs pericare c modI  Hand Dominance        Extremity/Trunk Assessment   Upper Extremity Assessment Upper Extremity Assessment: Generalized weakness;Overall [facility] for tasks assessed    Lower Extremity Assessment Lower Extremity Assessment:  Generalized weakness;Overall [facility] for tasks assessed    Cervical / Trunk Assessment Cervical / Trunk Assessment: Normal  Communication   Communication: Interpreter utilized  Cognition Arousal/Alertness: Awake/alert Behavior During Therapy: WFL for tasks assessed/performed Overall Cognitive Status: Within Functional Limits for tasks assessed                                        General Comments      Exercises     Assessment/Plan    PT Assessment Patient needs continued PT services  PT Problem List Decreased strength;Decreased activity tolerance;Decreased mobility       PT Treatment Interventions Therapeutic exercise;Gait training;Functional mobility training;Stair training;Therapeutic activities    PT Goals (Current goals can be found in the Care Plan section)  Acute Rehab PT Goals Patient Stated Goal: feel better, get stronger  PT Goal Formulation: With patient Time For Goal Achievement: 11/29/18 Potential to Achieve Goals: Fair    Frequency Min 2X/week   Barriers to discharge        Co-evaluation               AM-PAC PT "6 Clicks" Mobility  Outcome Measure Help needed turning from your back to your side while in a flat bed without using bedrails?: A Little Help needed moving from lying on your back to sitting on the side of a flat bed without using bedrails?: A Lot Help needed moving to and from a bed to a chair (including a wheelchair)?: A Lot Help needed standing up from a chair using your arms (e.g., wheelchair or bedside chair)?: A Little Help needed to walk in hospital room?: A Little Help needed climbing 3-5 steps with a railing? : A Lot 6 Click Score: 15    End of Session Equipment Utilized During Treatment: Gait belt Activity Tolerance: Patient limited by fatigue;No increased pain Patient left: in bed;with call bell/phone within reach Nurse Communication: Mobility status PT Visit Diagnosis: Unsteadiness on feet (R26.81);Muscle  weakness (generalized) (M62.81)    Time: 4132-4401 PT Time Calculation (min) (ACUTE ONLY): 19 min   Charges:   PT Evaluation $PT Eval Low Complexity: 1 Low          12:45 PM, 11/15/18 Etta Grandchild, PT, DPT Physical Therapist - [facility]  [phone] ([place])   [place] [facility] 11/15/2018, 12:43 PM

## 2018-11-15 NOTE — Plan of Care (Signed)
  Problem: Clinical Measurements: Goal: Will remain free from infection Outcome: Progressing Goal: Diagnostic test results will improve Outcome: Progressing Goal: Respiratory complications will improve Outcome: Progressing Goal: Cardiovascular complication will be avoided Outcome: Progressing   Problem: Activity: Goal: Risk for activity intolerance will decrease Outcome: Progressing   Problem: Nutrition: Goal: Adequate nutrition will be maintained Outcome: Progressing   Problem: Coping: Goal: Level of anxiety will decrease Outcome: Progressing   Problem: Elimination: Goal: Will not experience complications related to bowel motility Outcome: Progressing   Problem: Pain Managment: Goal: General experience of comfort will improve Outcome: Progressing   Problem: Safety: Goal: Ability to remain free from injury will improve Outcome: Progressing   Problem: Skin Integrity: Goal: Risk for impaired skin integrity will decrease Outcome: Progressing

## 2018-11-18 LAB — CULTURE, BLOOD (ROUTINE X 2)
Culture: NO GROWTH
Special Requests: ADEQUATE

## 2019-04-25 ENCOUNTER — Inpatient Hospital Stay
Admission: EM | Admit: 2019-04-25 | Discharge: 2019-05-02 | DRG: 871 | Disposition: A | Discharge status: Home / Self Care | Payer: Medicare Other | Attending: Internal Medicine | Admitting: Internal Medicine

## 2019-04-25 DIAGNOSIS — Z7902 Long term (current) use of antithrombotics/antiplatelets: Secondary | ICD-10-CM

## 2019-04-25 DIAGNOSIS — Z87891 Personal history of nicotine dependence: Secondary | ICD-10-CM

## 2019-04-25 DIAGNOSIS — Z66 Do not resuscitate: Secondary | ICD-10-CM | POA: Diagnosis present

## 2019-04-25 DIAGNOSIS — Z951 Presence of aortocoronary bypass graft: Secondary | ICD-10-CM

## 2019-04-25 DIAGNOSIS — N186 End stage renal disease: Secondary | ICD-10-CM

## 2019-04-25 DIAGNOSIS — M109 Gout, unspecified: Secondary | ICD-10-CM | POA: Diagnosis present

## 2019-04-25 DIAGNOSIS — Z888 Allergy status to other drugs, medicaments and biological substances status: Secondary | ICD-10-CM

## 2019-04-25 DIAGNOSIS — R0600 Dyspnea, unspecified: Secondary | ICD-10-CM

## 2019-04-25 DIAGNOSIS — Z8711 Personal history of peptic ulcer disease: Secondary | ICD-10-CM

## 2019-04-25 DIAGNOSIS — J189 Pneumonia, unspecified organism: Secondary | ICD-10-CM | POA: Diagnosis present

## 2019-04-25 DIAGNOSIS — A419 Sepsis, unspecified organism: Principal | ICD-10-CM | POA: Diagnosis present

## 2019-04-25 DIAGNOSIS — Z79899 Other long term (current) drug therapy: Secondary | ICD-10-CM

## 2019-04-25 DIAGNOSIS — I132 Hypertensive heart and chronic kidney disease with heart failure and with stage 5 chronic kidney disease, or end stage renal disease: Secondary | ICD-10-CM | POA: Diagnosis present

## 2019-04-25 DIAGNOSIS — Z825 Family history of asthma and other chronic lower respiratory diseases: Secondary | ICD-10-CM

## 2019-04-25 DIAGNOSIS — G40909 Epilepsy, unspecified, not intractable, without status epilepticus: Secondary | ICD-10-CM | POA: Diagnosis present

## 2019-04-25 DIAGNOSIS — R0602 Shortness of breath: Secondary | ICD-10-CM | POA: Diagnosis not present

## 2019-04-25 DIAGNOSIS — D631 Anemia in chronic kidney disease: Secondary | ICD-10-CM | POA: Diagnosis present

## 2019-04-25 DIAGNOSIS — E1029 Type 1 diabetes mellitus with other diabetic kidney complication: Secondary | ICD-10-CM | POA: Diagnosis present

## 2019-04-25 DIAGNOSIS — F0390 Unspecified dementia without behavioral disturbance: Secondary | ICD-10-CM | POA: Diagnosis present

## 2019-04-25 DIAGNOSIS — I2510 Atherosclerotic heart disease of native coronary artery without angina pectoris: Secondary | ICD-10-CM | POA: Diagnosis present

## 2019-04-25 DIAGNOSIS — E7800 Pure hypercholesterolemia, unspecified: Secondary | ICD-10-CM | POA: Diagnosis present

## 2019-04-25 DIAGNOSIS — E785 Hyperlipidemia, unspecified: Secondary | ICD-10-CM | POA: Diagnosis present

## 2019-04-25 DIAGNOSIS — Z940 Kidney transplant status: Secondary | ICD-10-CM

## 2019-04-25 DIAGNOSIS — K219 Gastro-esophageal reflux disease without esophagitis: Secondary | ICD-10-CM | POA: Diagnosis present

## 2019-04-25 DIAGNOSIS — Z823 Family history of stroke: Secondary | ICD-10-CM

## 2019-04-25 DIAGNOSIS — Z7982 Long term (current) use of aspirin: Secondary | ICD-10-CM

## 2019-04-25 DIAGNOSIS — I5033 Acute on chronic diastolic (congestive) heart failure: Secondary | ICD-10-CM | POA: Diagnosis present

## 2019-04-25 DIAGNOSIS — N2581 Secondary hyperparathyroidism of renal origin: Secondary | ICD-10-CM | POA: Diagnosis present

## 2019-04-25 DIAGNOSIS — Z8249 Family history of ischemic heart disease and other diseases of the circulatory system: Secondary | ICD-10-CM

## 2019-04-25 DIAGNOSIS — Z992 Dependence on renal dialysis: Secondary | ICD-10-CM

## 2019-04-25 DIAGNOSIS — Z91041 Radiographic dye allergy status: Secondary | ICD-10-CM

## 2019-04-25 DIAGNOSIS — Z20828 Contact with and (suspected) exposure to other viral communicable diseases: Secondary | ICD-10-CM | POA: Diagnosis present

## 2019-04-25 DIAGNOSIS — Z955 Presence of coronary angioplasty implant and graft: Secondary | ICD-10-CM

## 2019-04-25 DIAGNOSIS — Z8673 Personal history of transient ischemic attack (TIA), and cerebral infarction without residual deficits: Secondary | ICD-10-CM

## 2019-04-25 DIAGNOSIS — E1122 Type 2 diabetes mellitus with diabetic chronic kidney disease: Secondary | ICD-10-CM | POA: Diagnosis present

## 2019-04-25 DIAGNOSIS — G9341 Metabolic encephalopathy: Secondary | ICD-10-CM | POA: Diagnosis present

## 2019-04-25 DIAGNOSIS — I252 Old myocardial infarction: Secondary | ICD-10-CM

## 2019-04-25 DIAGNOSIS — Z833 Family history of diabetes mellitus: Secondary | ICD-10-CM

## 2019-04-25 DIAGNOSIS — R569 Unspecified convulsions: Secondary | ICD-10-CM

## 2019-04-26 ENCOUNTER — Emergency Department: Payer: Medicare Other

## 2019-04-26 ENCOUNTER — Other Ambulatory Visit: Payer: Self-pay

## 2019-04-26 DIAGNOSIS — I2510 Atherosclerotic heart disease of native coronary artery without angina pectoris: Secondary | ICD-10-CM | POA: Diagnosis present

## 2019-04-26 DIAGNOSIS — N2581 Secondary hyperparathyroidism of renal origin: Secondary | ICD-10-CM | POA: Diagnosis present

## 2019-04-26 DIAGNOSIS — Z7902 Long term (current) use of antithrombotics/antiplatelets: Secondary | ICD-10-CM | POA: Diagnosis not present

## 2019-04-26 DIAGNOSIS — K219 Gastro-esophageal reflux disease without esophagitis: Secondary | ICD-10-CM | POA: Diagnosis present

## 2019-04-26 DIAGNOSIS — E7800 Pure hypercholesterolemia, unspecified: Secondary | ICD-10-CM | POA: Diagnosis present

## 2019-04-26 DIAGNOSIS — F0390 Unspecified dementia without behavioral disturbance: Secondary | ICD-10-CM | POA: Diagnosis not present

## 2019-04-26 DIAGNOSIS — Z940 Kidney transplant status: Secondary | ICD-10-CM | POA: Diagnosis not present

## 2019-04-26 DIAGNOSIS — A403 Sepsis due to Streptococcus pneumoniae: Secondary | ICD-10-CM | POA: Diagnosis not present

## 2019-04-26 DIAGNOSIS — G9341 Metabolic encephalopathy: Secondary | ICD-10-CM | POA: Diagnosis present

## 2019-04-26 DIAGNOSIS — R6520 Severe sepsis without septic shock: Secondary | ICD-10-CM

## 2019-04-26 DIAGNOSIS — E1122 Type 2 diabetes mellitus with diabetic chronic kidney disease: Secondary | ICD-10-CM | POA: Diagnosis present

## 2019-04-26 DIAGNOSIS — E1029 Type 1 diabetes mellitus with other diabetic kidney complication: Secondary | ICD-10-CM | POA: Diagnosis not present

## 2019-04-26 DIAGNOSIS — Z66 Do not resuscitate: Secondary | ICD-10-CM | POA: Diagnosis present

## 2019-04-26 DIAGNOSIS — I5033 Acute on chronic diastolic (congestive) heart failure: Secondary | ICD-10-CM | POA: Diagnosis present

## 2019-04-26 DIAGNOSIS — I252 Old myocardial infarction: Secondary | ICD-10-CM | POA: Diagnosis not present

## 2019-04-26 DIAGNOSIS — Z20828 Contact with and (suspected) exposure to other viral communicable diseases: Secondary | ICD-10-CM | POA: Diagnosis present

## 2019-04-26 DIAGNOSIS — I132 Hypertensive heart and chronic kidney disease with heart failure and with stage 5 chronic kidney disease, or end stage renal disease: Secondary | ICD-10-CM | POA: Diagnosis present

## 2019-04-26 DIAGNOSIS — N186 End stage renal disease: Secondary | ICD-10-CM

## 2019-04-26 DIAGNOSIS — Z951 Presence of aortocoronary bypass graft: Secondary | ICD-10-CM | POA: Diagnosis not present

## 2019-04-26 DIAGNOSIS — R4182 Altered mental status, unspecified: Secondary | ICD-10-CM | POA: Diagnosis not present

## 2019-04-26 DIAGNOSIS — M109 Gout, unspecified: Secondary | ICD-10-CM | POA: Diagnosis present

## 2019-04-26 DIAGNOSIS — Z833 Family history of diabetes mellitus: Secondary | ICD-10-CM | POA: Diagnosis not present

## 2019-04-26 DIAGNOSIS — J9601 Acute respiratory failure with hypoxia: Secondary | ICD-10-CM

## 2019-04-26 DIAGNOSIS — Z8673 Personal history of transient ischemic attack (TIA), and cerebral infarction without residual deficits: Secondary | ICD-10-CM | POA: Diagnosis not present

## 2019-04-26 DIAGNOSIS — Z7982 Long term (current) use of aspirin: Secondary | ICD-10-CM | POA: Diagnosis not present

## 2019-04-26 DIAGNOSIS — Z992 Dependence on renal dialysis: Secondary | ICD-10-CM | POA: Diagnosis not present

## 2019-04-26 DIAGNOSIS — J189 Pneumonia, unspecified organism: Secondary | ICD-10-CM | POA: Diagnosis present

## 2019-04-26 DIAGNOSIS — Z955 Presence of coronary angioplasty implant and graft: Secondary | ICD-10-CM | POA: Diagnosis not present

## 2019-04-26 DIAGNOSIS — Z8711 Personal history of peptic ulcer disease: Secondary | ICD-10-CM | POA: Diagnosis not present

## 2019-04-26 DIAGNOSIS — A419 Sepsis, unspecified organism: Secondary | ICD-10-CM | POA: Diagnosis present

## 2019-04-26 DIAGNOSIS — R0602 Shortness of breath: Secondary | ICD-10-CM | POA: Diagnosis present

## 2019-04-26 LAB — CBC
HCT: 35.7 % — ABNORMAL LOW (ref 39.0–52.0)
Hemoglobin: 11.3 g/dL — ABNORMAL LOW (ref 13.0–17.0)
MCH: 23.1 pg — ABNORMAL LOW (ref 26.0–34.0)
MCHC: 31.7 g/dL (ref 30.0–36.0)
MCV: 73 fL — ABNORMAL LOW (ref 80.0–100.0)
Platelets: 147 10*3/uL — ABNORMAL LOW (ref 150–400)
RBC: 4.89 MIL/uL (ref 4.22–5.81)
RDW: 15.5 % (ref 11.5–15.5)
WBC: 13.5 10*3/uL — ABNORMAL HIGH (ref 4.0–10.5)
nRBC: 0 % (ref 0.0–0.2)

## 2019-04-26 LAB — COMPREHENSIVE METABOLIC PANEL
ALT: 13 U/L (ref 0–44)
AST: 18 U/L (ref 15–41)
Albumin: 3.6 g/dL (ref 3.5–5.0)
Alkaline Phosphatase: 84 U/L (ref 38–126)
Anion gap: 14 (ref 5–15)
BUN: 49 mg/dL — ABNORMAL HIGH (ref 8–23)
CO2: 29 mmol/L (ref 22–32)
Calcium: 9 mg/dL (ref 8.9–10.3)
Chloride: 95 mmol/L — ABNORMAL LOW (ref 98–111)
Creatinine, Ser: 10.7 mg/dL — ABNORMAL HIGH (ref 0.61–1.24)
GFR calc Af Amer: 5 mL/min — ABNORMAL LOW (ref >60)
GFR calc non Af Amer: 4 mL/min — ABNORMAL LOW (ref >60)
Glucose, Bld: 128 mg/dL — ABNORMAL HIGH (ref 70–99)
Potassium: 4.1 mmol/L (ref 3.5–5.1)
Sodium: 138 mmol/L (ref 135–145)
Total Bilirubin: 0.7 mg/dL (ref 0.3–1.2)
Total Protein: 7.8 g/dL (ref 6.5–8.1)

## 2019-04-26 LAB — CBC WITH DIFFERENTIAL/PLATELET
Abs Immature Granulocytes: 0.05 10*3/uL (ref 0.00–0.07)
Basophils Absolute: 0 10*3/uL (ref 0.0–0.1)
Basophils Relative: 0 %
Eosinophils Absolute: 0.3 10*3/uL (ref 0.0–0.5)
Eosinophils Relative: 2 %
HCT: 35.7 % — ABNORMAL LOW (ref 39.0–52.0)
Hemoglobin: 11.2 g/dL — ABNORMAL LOW (ref 13.0–17.0)
Immature Granulocytes: 0 %
Lymphocytes Relative: 9 %
Lymphs Abs: 1 10*3/uL (ref 0.7–4.0)
MCH: 22.9 pg — ABNORMAL LOW (ref 26.0–34.0)
MCHC: 31.4 g/dL (ref 30.0–36.0)
MCV: 73 fL — ABNORMAL LOW (ref 80.0–100.0)
Monocytes Absolute: 0.7 10*3/uL (ref 0.1–1.0)
Monocytes Relative: 6 %
Neutro Abs: 9.5 10*3/uL — ABNORMAL HIGH (ref 1.7–7.7)
Neutrophils Relative %: 83 %
Platelets: 185 10*3/uL (ref 150–400)
RBC: 4.89 MIL/uL (ref 4.22–5.81)
RDW: 15.6 % — ABNORMAL HIGH (ref 11.5–15.5)
WBC: 11.6 10*3/uL — ABNORMAL HIGH (ref 4.0–10.5)
nRBC: 0 % (ref 0.0–0.2)

## 2019-04-26 LAB — BASIC METABOLIC PANEL
Anion gap: 13 (ref 5–15)
BUN: 49 mg/dL — ABNORMAL HIGH (ref 8–23)
CO2: 28 mmol/L (ref 22–32)
Calcium: 9.1 mg/dL (ref 8.9–10.3)
Chloride: 97 mmol/L — ABNORMAL LOW (ref 98–111)
Creatinine, Ser: 10.96 mg/dL — ABNORMAL HIGH (ref 0.61–1.24)
GFR calc Af Amer: 5 mL/min — ABNORMAL LOW (ref >60)
GFR calc non Af Amer: 4 mL/min — ABNORMAL LOW (ref >60)
Glucose, Bld: 149 mg/dL — ABNORMAL HIGH (ref 70–99)
Potassium: 4.5 mmol/L (ref 3.5–5.1)
Sodium: 138 mmol/L (ref 135–145)

## 2019-04-26 LAB — GLUCOSE, CAPILLARY
Glucose-Capillary: 102 mg/dL — ABNORMAL HIGH (ref 70–99)
Glucose-Capillary: 96 mg/dL (ref 70–99)
Glucose-Capillary: 85 mg/dL (ref 70–99)
Glucose-Capillary: 100 mg/dL — ABNORMAL HIGH (ref 70–99)

## 2019-04-26 LAB — TROPONIN I (HIGH SENSITIVITY): Troponin I (High Sensitivity): 37 ng/L — ABNORMAL HIGH (ref <18)

## 2019-04-26 LAB — LACTIC ACID, PLASMA: Lactic Acid, Venous: 1.5 mmol/L (ref 0.5–1.9)

## 2019-04-26 LAB — INFLUENZA PANEL BY PCR (TYPE A & B)
Influenza A By PCR: NEGATIVE
Influenza A By PCR: NEGATIVE
Influenza B By PCR: NEGATIVE
Influenza B By PCR: NEGATIVE

## 2019-04-26 LAB — HIV ANTIBODY (ROUTINE TESTING W REFLEX): HIV Screen 4th Generation wRfx: NONREACTIVE

## 2019-04-26 LAB — HEMOGLOBIN A1C
Hgb A1c MFr Bld: 5.8 % — ABNORMAL HIGH (ref 4.8–5.6)
Mean Plasma Glucose: 119.76 mg/dL

## 2019-04-26 LAB — SARS CORONAVIRUS 2 (TAT 6-24 HRS): SARS Coronavirus 2: NEGATIVE

## 2019-04-26 LAB — PROCALCITONIN: Procalcitonin: 0.21 ng/mL

## 2019-04-26 MED ORDER — ENOXAPARIN SODIUM 40 MG/0.4ML ~~LOC~~ SOLN: 40.0000 mg | SUBCUTANEOUS | Status: DC

## 2019-04-26 MED ORDER — METOPROLOL SUCCINATE ER 25 MG PO TB24
12.5000 mg | ORAL_TABLET | Freq: Every day | ORAL | Status: DC
  Administered 2019-04-27 – 2019-05-02 (×4): 12.5 mg via ORAL
  Filled 2019-04-26: qty 0.5
  Filled 2019-04-26 (×5): qty 1

## 2019-04-26 MED ORDER — MIRTAZAPINE 15 MG PO TABS: 15.0000 mg | ORAL_TABLET | Freq: Every day | ORAL | Status: DC

## 2019-04-26 MED ORDER — ATORVASTATIN CALCIUM 20 MG PO TABS
80.0000 mg | ORAL_TABLET | Freq: Every day | ORAL | Status: DC
  Administered 2019-04-26 – 2019-05-01 (×5): 80 mg via ORAL
  Filled 2019-04-26 (×5): qty 4

## 2019-04-26 MED ORDER — INSULIN ASPART 100 UNIT/ML ~~LOC~~ SOLN
0.0000 [IU] | Freq: Three times a day (TID) | SUBCUTANEOUS | Status: DC
  Administered 2019-04-28: 17:00:00 2 [IU] via SUBCUTANEOUS
  Administered 2019-04-30: 14:00:00 3 [IU] via SUBCUTANEOUS
  Administered 2019-05-01 – 2019-05-02 (×2): 2 [IU] via SUBCUTANEOUS
  Filled 2019-04-26 (×4): qty 1

## 2019-04-26 MED ORDER — CLOPIDOGREL BISULFATE 75 MG PO TABS: 75.0000 mg | ORAL_TABLET | Freq: Every day | ORAL | Status: DC

## 2019-04-26 MED ORDER — ATORVASTATIN CALCIUM 20 MG PO TABS: 80.0000 mg | ORAL_TABLET | Freq: Every day | ORAL | Status: DC

## 2019-04-26 MED ORDER — HEPARIN SODIUM (PORCINE) 5000 UNIT/ML IJ SOLN
5000.0000 [IU] | Freq: Three times a day (TID) | INTRAMUSCULAR | Status: DC
  Administered 2019-04-26 – 2019-05-02 (×20): 5000 [IU] via SUBCUTANEOUS
  Filled 2019-04-26 (×20): qty 1

## 2019-04-26 MED ORDER — BISACODYL 10 MG RE SUPP
10.0000 mg | Freq: Every day | RECTAL | Status: DC | PRN
  Filled 2019-04-26: qty 1

## 2019-04-26 MED ORDER — ACETAMINOPHEN 650 MG RE SUPP: 650.0000 mg | Freq: Four times a day (QID) | RECTAL | Status: DC | PRN

## 2019-04-26 MED ORDER — ASPIRIN EC 81 MG PO TBEC
81.0000 mg | DELAYED_RELEASE_TABLET | Freq: Every day | ORAL | Status: DC
  Administered 2019-04-26 – 2019-05-02 (×6): 81 mg via ORAL
  Filled 2019-04-26 (×7): qty 1

## 2019-04-26 MED ORDER — ACETAMINOPHEN 325 MG PO TABS: 650.0000 mg | ORAL_TABLET | Freq: Four times a day (QID) | ORAL | Status: DC | PRN

## 2019-04-26 MED ORDER — CALCIUM ACETATE (PHOS BINDER) 667 MG PO CAPS
1334.0000 mg | ORAL_CAPSULE | Freq: Three times a day (TID) | ORAL | Status: DC
  Administered 2019-04-26 – 2019-04-29 (×3): 1334 mg via ORAL
  Filled 2019-04-26 (×7): qty 2

## 2019-04-26 MED ORDER — LEVETIRACETAM 250 MG PO TABS: 250.0000 mg | ORAL_TABLET | ORAL | Status: DC

## 2019-04-26 MED ORDER — SODIUM CHLORIDE 0.9 % IV SOLN
750.0000 mg | INTRAVENOUS | Status: DC
  Administered 2019-04-26: 10:00:00 750 mg via INTRAVENOUS
  Filled 2019-04-26 (×2): qty 7.5

## 2019-04-26 MED ORDER — INFLUENZA VAC A&B SA ADJ QUAD 0.5 ML IM PRSY
0.5000 mL | PREFILLED_SYRINGE | INTRAMUSCULAR | Status: DC
  Filled 2019-04-26: qty 0.5

## 2019-04-26 MED ORDER — FUROSEMIDE 10 MG/ML IJ SOLN
40.0000 mg | Freq: Two times a day (BID) | INTRAMUSCULAR | Status: DC
  Administered 2019-04-26 – 2019-04-27 (×3): 40 mg via INTRAVENOUS
  Filled 2019-04-26 (×3): qty 4

## 2019-04-26 MED ORDER — LEVETIRACETAM 750 MG PO TABS
750.0000 mg | ORAL_TABLET | Freq: Every day | ORAL | Status: DC
  Administered 2019-04-27 – 2019-04-28 (×3): 750 mg via ORAL
  Filled 2019-04-26 (×3): qty 1

## 2019-04-26 MED ORDER — SODIUM CHLORIDE 0.9 % IV SOLN
500.0000 mg | INTRAVENOUS | Status: DC
  Administered 2019-04-27 – 2019-04-28 (×2): 500 mg via INTRAVENOUS
  Filled 2019-04-26 (×3): qty 500

## 2019-04-26 MED ORDER — ACETAMINOPHEN 650 MG RE SUPP
650.0000 mg | Freq: Once | RECTAL | Status: AC
  Administered 2019-04-26: 02:00:00 650 mg via RECTAL
  Filled 2019-04-26: qty 1

## 2019-04-26 MED ORDER — SODIUM CHLORIDE 0.9 % IV SOLN
2.0000 g | INTRAVENOUS | Status: DC
  Administered 2019-04-26 – 2019-04-28 (×3): 2 g via INTRAVENOUS
  Filled 2019-04-26: qty 2
  Filled 2019-04-26: qty 20
  Filled 2019-04-26: qty 2
  Filled 2019-04-26: qty 20

## 2019-04-26 MED ORDER — MIRTAZAPINE 15 MG PO TABS
15.0000 mg | ORAL_TABLET | Freq: Every day | ORAL | Status: DC
  Administered 2019-04-26 – 2019-05-01 (×5): 15 mg via ORAL
  Filled 2019-04-26 (×6): qty 1

## 2019-04-26 MED ORDER — SODIUM CHLORIDE 0.9 % IV SOLN
250.0000 mg | INTRAVENOUS | Status: DC
  Filled 2019-04-26: qty 2.5

## 2019-04-26 MED ORDER — MEMANTINE HCL 5 MG PO TABS: 10.0000 mg | ORAL_TABLET | Freq: Every day | ORAL | Status: DC

## 2019-04-26 MED ORDER — SODIUM CHLORIDE 0.9 % IV SOLN
500.0000 mg | Freq: Once | INTRAVENOUS | Status: AC
  Administered 2019-04-26: 04:00:00 500 mg via INTRAVENOUS
  Filled 2019-04-26: qty 500

## 2019-04-26 MED ORDER — NITROGLYCERIN 0.4 MG SL SUBL: 0.4000 mg | SUBLINGUAL_TABLET | SUBLINGUAL | Status: DC | PRN

## 2019-04-26 MED ORDER — LEVETIRACETAM 250 MG PO TABS
250.0000 mg | ORAL_TABLET | ORAL | Status: DC
  Administered 2019-04-27: 250 mg via ORAL
  Filled 2019-04-26: qty 1

## 2019-04-26 MED ORDER — ONDANSETRON HCL 4 MG/2ML IJ SOLN: 4.0000 mg | Freq: Four times a day (QID) | INTRAMUSCULAR | Status: DC | PRN

## 2019-04-26 MED ORDER — LEVETIRACETAM 250 MG PO TABS: 250.0000 mg | ORAL_TABLET | Freq: Every day | ORAL | Status: DC

## 2019-04-26 MED ORDER — MEMANTINE HCL 5 MG PO TABS
5.0000 mg | ORAL_TABLET | Freq: Every day | ORAL | Status: DC
  Administered 2019-04-27 – 2019-05-02 (×5): 5 mg via ORAL
  Filled 2019-04-26 (×6): qty 1

## 2019-04-26 MED ORDER — LEVETIRACETAM 750 MG PO TABS
750.0000 mg | ORAL_TABLET | Freq: Every day | ORAL | Status: DC
  Filled 2019-04-26: qty 1

## 2019-04-26 MED ORDER — VANCOMYCIN HCL IN DEXTROSE 1-5 GM/200ML-% IV SOLN
1000.0000 mg | Freq: Once | INTRAVENOUS | Status: AC
  Administered 2019-04-26: 04:00:00 1000 mg via INTRAVENOUS
  Filled 2019-04-26: qty 200

## 2019-04-26 MED ORDER — CLOPIDOGREL BISULFATE 75 MG PO TABS
75.0000 mg | ORAL_TABLET | Freq: Every day | ORAL | Status: DC
  Administered 2019-04-27 – 2019-05-02 (×5): 75 mg via ORAL
  Filled 2019-04-26 (×6): qty 1

## 2019-04-26 MED ORDER — ACETAMINOPHEN 500 MG PO TABS
1000.0000 mg | ORAL_TABLET | Freq: Once | ORAL | Status: DC
  Filled 2019-04-26: qty 2

## 2019-04-26 MED ORDER — ONDANSETRON HCL 4 MG PO TABS: 4.0000 mg | ORAL_TABLET | Freq: Four times a day (QID) | ORAL | Status: DC | PRN

## 2019-04-26 MED ORDER — MAGNESIUM HYDROXIDE 400 MG/5ML PO SUSP: 30.0000 mL | Freq: Every day | ORAL | Status: DC | PRN

## 2019-04-26 MED ORDER — METOPROLOL SUCCINATE ER 25 MG PO TB24: 12.5000 mg | ORAL_TABLET | ORAL | Status: DC

## 2019-04-26 MED ORDER — TRAZODONE HCL 50 MG PO TABS: 25.0000 mg | ORAL_TABLET | Freq: Every evening | ORAL | Status: DC | PRN

## 2019-04-26 MED ORDER — FUROSEMIDE 10 MG/ML IJ SOLN: 40.0000 mg | Freq: Two times a day (BID) | INTRAMUSCULAR | Status: DC

## 2019-04-26 MED ORDER — ALLOPURINOL 100 MG PO TABS
100.0000 mg | ORAL_TABLET | Freq: Every day | ORAL | Status: DC
  Filled 2019-04-26: qty 1

## 2019-04-26 MED ORDER — ASPIRIN EC 81 MG PO TBEC: 81.0000 mg | DELAYED_RELEASE_TABLET | Freq: Every day | ORAL | Status: DC

## 2019-04-26 MED ORDER — SODIUM CHLORIDE 0.9 % IV SOLN
INTRAVENOUS | Status: DC
  Administered 2019-04-26 (×2): via INTRAVENOUS

## 2019-04-26 MED ORDER — CALCIUM ACETATE (PHOS BINDER) 667 MG PO CAPS
1334.0000 mg | ORAL_CAPSULE | Freq: Three times a day (TID) | ORAL | Status: DC
  Filled 2019-04-26 (×2): qty 2

## 2019-04-26 MED ORDER — SODIUM CHLORIDE 0.9 % IV SOLN
1.0000 g | Freq: Once | INTRAVENOUS | Status: AC
  Administered 2019-04-26: 02:00:00 1 g via INTRAVENOUS
  Filled 2019-04-26: qty 1

## 2019-04-26 NOTE — ED Notes (Signed)
Patient resting in bed with eyes closed.

## 2019-04-26 NOTE — ED Notes (Signed)
Patient resting in bed with eyes closed. Even, unlabored respirations noted at this time.

## 2019-04-26 NOTE — Progress Notes (Signed)
PROGRESS NOTE    [name]  [id number] DOB: 04/14/1951 DOA: 04/25/2019 PCP: Kirk Ruths, MD      Brief Narrative:  [name] is a 68 y.o. M with ESRD, seizures, dCHF, CAD s/p CABG, dementia, HTN and DM who presented with fever, cough.  In the ER, CXR showed pneumonia.  Started on antibiotics.     Assessment & Plan:  Community-acquired pneumonia, right lower lobe -Continue ceftriaxone and azithromycin -SLP consult   ESRD -Hold IV fluids -Consult nephrology for maintenance HD, appreciate recommendations  Coronary disease, secondary prevention Chronic diastolic CHF Hypertension -Continue aspirin, atorvastatin -IV furosemide has been ordered, will continue -Continue metoprolol  Diabetes Glucoses normal -Continue sliding scale corrections  Dementia -Continue mirtazapine, memantine  History of seizures -Continue Keppra    DVT prophylaxis: Heparin Code Status: Full code Family Communication:  MDM and disposition Plan: This is a no charge note.  For further details, please see H&P by my partner Dr. Izora Gala from earlier today.  The below labs and imaging reports were reviewed and summarized above.    The patient was admitted with pneumonia    Objective: Vitals:   04/26/19 1715 04/26/19 1730 04/26/19 1800 04/26/19 1858  BP:  139/69 (!) 151/71 (!) 153/68  Pulse:   63 68  Resp: 18 16 18 18   Temp:    98.5 F (36.9 C)  TempSrc:    Axillary  SpO2:   98% 97%  Weight:      Height:        Intake/Output Summary (Last 24 hours) at 04/26/2019 1909 Last data filed at 04/26/2019 1049 Gross per 24 hour  Intake 106.71 ml  Output -  Net 106.71 ml   Filed Weights   04/26/19 0007  Weight: 77.1 kg    Examination: The patient was seen and examined.      Data Reviewed: I have personally reviewed following labs and imaging studies:  CBC: Recent Labs  Lab 04/26/19 0139 04/26/19 0411  WBC 11.6* 13.5*  NEUTROABS 9.5*  --   HGB 11.2* 11.3*  HCT  35.7* 35.7*  MCV 73.0* 73.0*  PLT 185 [id number]*   Basic Metabolic Panel: Recent Labs  Lab 04/26/19 0139 04/26/19 0411  NA 138 138  K 4.1 4.5  CL 95* 97*  CO2 29 28  GLUCOSE 128* 149*  BUN 49* 49*  CREATININE 10.70* 10.96*  CALCIUM 9.0 9.1   GFR: Estimated Creatinine Clearance: 6 mL/min (A) (by C-G formula based on SCr of 10.96 mg/dL (H)). Liver Function Tests: Recent Labs  Lab 04/26/19 0139  AST 18  ALT 13  ALKPHOS 84  BILITOT 0.7  PROT 7.8  ALBUMIN 3.6   No results for input(s): LIPASE, AMYLASE in the last 168 hours. No results for input(s): AMMONIA in the last 168 hours. Coagulation Profile: No results for input(s): INR, PROTIME in the last 168 hours. Cardiac Enzymes: No results for input(s): CKTOTAL, CKMB, CKMBINDEX, TROPONINI in the last 168 hours. BNP (last 3 results) No results for input(s): PROBNP in the last 8760 hours. HbA1C: Recent Labs    04/26/19 0411  HGBA1C 5.8*   CBG: Recent Labs  Lab 04/26/19 0949 04/26/19 1353 04/26/19 1737  GLUCAP 102* 96 85   Lipid Profile: No results for input(s): CHOL, HDL, LDLCALC, TRIG, CHOLHDL, LDLDIRECT in the last 72 hours. Thyroid Function Tests: No results for input(s): TSH, T4TOTAL, FREET4, T3FREE, THYROIDAB in the last 72 hours. Anemia Panel: No results for input(s): VITAMINB12, FOLATE, FERRITIN, TIBC, IRON, RETICCTPCT in  the last 72 hours. Urine analysis:    Component Value Date/Time   COLORURINE Straw 02/28/2014 1326   APPEARANCEUR Clear 02/28/2014 1326   LABSPEC 1.010 02/28/2014 1326   PHURINE 5.0 02/28/2014 1326   GLUCOSEU Negative 02/28/2014 1326   HGBUR 1+ 02/28/2014 1326   BILIRUBINUR Negative 02/28/2014 1326   KETONESUR Negative 02/28/2014 1326   PROTEINUR Negative 02/28/2014 1326   NITRITE Negative 02/28/2014 1326   LEUKOCYTESUR Negative 02/28/2014 1326   Sepsis Labs: @LABRCNTIP (procalcitonin:4,lacticacidven:4)  ) Recent Results (from the past 240 hour(s))  SARS CORONAVIRUS 2 (TAT 6-24  HRS) Nasopharyngeal Nasopharyngeal Swab     Status: None   Collection Time: 04/26/19 12:26 AM   Specimen: Nasopharyngeal Swab  Result Value Ref Range Status   SARS Coronavirus 2 NEGATIVE NEGATIVE Final    Comment: (NOTE) SARS-CoV-2 target nucleic acids are NOT DETECTED. The SARS-CoV-2 RNA is generally detectable in upper and lower respiratory specimens during the acute phase of infection. Negative results do not preclude SARS-CoV-2 infection, do not rule out co-infections with other pathogens, and should not be used as the sole basis for treatment or other patient management decisions. Negative results must be combined with clinical observations, patient history, and epidemiological information. The expected result is Negative. Fact Sheet for Patients: SugarRoll.be Fact Sheet for Healthcare Providers: https://www.woods-mathews.com/ This test is not yet approved or cleared by the [place] FDA and  has been authorized for detection and/or diagnosis of SARS-CoV-2 by FDA under an Emergency Use Authorization (EUA). This EUA will remain  in effect (meaning this test can be used) for the duration of the COVID-19 declaration under Section 56 4(b)(1) of the Act, 21 U.S.C. section 360bbb-3(b)(1), unless the authorization is terminated or revoked sooner. Performed at [facility], [place] [address], [place], [place] [address]   Blood culture (routine x 2)     Status: None (Preliminary result)   Collection Time: 04/26/19  1:39 AM   Specimen: BLOOD  Result Value Ref Range Status   Specimen Description BLOOD RIGHT HAND  Final   Special Requests   Final    BOTTLES DRAWN AEROBIC AND ANAEROBIC Blood Culture results may not be optimal due to an inadequate volume of blood received in culture bottles   Culture   Final    NO GROWTH < 12 HOURS Performed at [facility], [address] [place], [place] [address]    Report Status PENDING   Incomplete  Blood culture (routine x 2)     Status: None (Preliminary result)   Collection Time: 04/26/19  2:25 AM   Specimen: BLOOD  Result Value Ref Range Status   Specimen Description BLOOD RIGHT HAND  Final   Special Requests   Final    BOTTLES DRAWN AEROBIC AND ANAEROBIC Blood Culture adequate volume   Culture   Final    NO GROWTH < 12 HOURS Performed at [facility], [address] [place], [place] [address]    Report Status PENDING  Incomplete         Radiology Studies: Dg Chest Portable 1 View  Result Date: 04/26/2019 CLINICAL DATA:  Firmness of breath EXAM: PORTABLE CHEST 1 VIEW COMPARISON:  Nov 13, 2018 FINDINGS: There is mild cardiomegaly. Overlying median sternotomy wires. Prominence to the central pulmonary vasculature seen. No large airspace consolidation. There is blunting of the left costophrenic angle which could be due to a trace effusion. No acute osseous abnormality. Vascular stent seen within the left arm. IMPRESSION: Mild cardiomegaly and pulmonary  vascular congestion. Probable trace left pleural effusion. Electronically Signed   By: Prudencio Pair M.D.   On: 04/26/2019 00:29        Scheduled Meds: . acetaminophen  1,000 mg Oral Once  . aspirin EC  81 mg Oral Daily  . atorvastatin  80 mg Oral q1800  . calcium acetate  1,334 mg Oral TID WC  . [START ON 04/27/2019] clopidogrel  75 mg Oral Daily  . furosemide  40 mg Intravenous Q12H  . heparin injection (subcutaneous)  5,000 Units Subcutaneous Q8H  . insulin aspart  0-15 Units Subcutaneous TID WC  . [START ON 04/27/2019] levETIRAcetam  750 mg Oral Daily   And  . [START ON 04/27/2019] levETIRAcetam  250 mg Oral Q T,Th,Sat-1800  . [START ON 04/27/2019] memantine  5 mg Oral Daily  . [START ON 04/27/2019] metoprolol succinate  12.5 mg Oral Daily  . mirtazapine  15 mg Oral QHS   Continuous Infusions: . [START ON 04/27/2019] azithromycin    . cefTRIAXone (ROCEPHIN)  IV Stopped (04/26/19 1604)      LOS: 0 days    Time spent: [place], MD [facility] 04/26/2019, 7:09 PM     Pager [phone] --- please page though AMION:  [email or web address] Password [email or web address] If 7PM-7AM, please contact night-coverage

## 2019-04-26 NOTE — ED Triage Notes (Signed)
Per EMS pt's family called due to pt moaning and being unresponsive. Pt unable to tell us his name. Pt had dialysis Saturday. L arm fistula. Pt's baseline uncertain.

## 2019-04-26 NOTE — ED Notes (Signed)
Spoke with Erasmo Downer regarding patient's swallow eval. States she will get in touch with the speech therapist and see if she is available to come screen patient.

## 2019-04-26 NOTE — ED Notes (Signed)
Pt has condom cath on incase of any urine but pt states that he has not made urine in many months.

## 2019-04-26 NOTE — ED Notes (Signed)
Patient laying in bed with eyes closed. Even, unlabored respirations noted.

## 2019-04-26 NOTE — Progress Notes (Signed)
Established patient known at [facility]TS 06:30, family normally transports patient to treatments. Pending COVID test patient schedule will change if POSITIVE. Please inform me as soon as possible so a chair time can be obtained.  Elvera Bicker Dialysis Coordinator [phone]

## 2019-04-26 NOTE — ED Provider Notes (Signed)
[facility] Emergency Department Provider Note  ____________________________________________  Time seen: Approximately 12:49 AM  I have reviewed the triage vital signs and the nursing notes.   HISTORY  Chief Complaint Shortness of Breath  Level 5 caveat:  Portions of the history and physical were unable to be obtained due to patient's mental status  HPI [name] is a 68 y.o. male with a history of diastolic CHF, ESRD on HD (TTS), last treatment 2 days ago, hypertension, diabetes, CVA with a decrease cognition CAD status post CABG presents  for evaluation of cough and fever.  History is gathered from patient's wife over the phone.  According to her patient is currently at his baseline mental status and is really unable to provide any history.  She reports that after she gave him a bath this evening that he felt warm to the touch and started coughing.  The cough sounded very productive and she was concerned because patient has had several episodes of pneumonia in the past.  She denies any vomiting or diarrhea, no known exposures to Covid  Past Medical History:  Diagnosis Date  . Chronic diastolic congestive heart failure ([place])   . Chronic disease anemia   . ESRD (end stage renal disease) on dialysis ([place])    "[place]; [place]; [name]" (09/29/2014)  . GERD (gastroesophageal reflux disease)   . Gout   . High cholesterol   . History of blood transfusion    "related to anemia"  . History of stomach ulcers   . Hypertension   . Type II diabetes mellitus [facility])     Patient Active Problem List   Diagnosis Date Noted  . Community acquired pneumonia 11/13/2018  . CVA (cerebral vascular accident) ([place])   . Palliative care by specialist   . Acute encephalopathy 02/19/2018  . Calculus of common duct   . Fitting and adjustment of gastrointestinal appliance and device   . Elevated LFTs 09/16/2016  . Calculus of bile duct without cholecystitis with obstruction   .  Choledocholithiasis   . Diabetes ([place]) 09/04/2016  . HTN (hypertension) 09/04/2016  . GERD (gastroesophageal reflux disease) 09/04/2016  . Chronic diastolic CHF (congestive heart failure) ([place]) 09/04/2016  . Transaminitis 09/04/2016  . Sepsis ([place]) 09/04/2016  . CAP (community acquired pneumonia) 09/04/2016  . Encephalopathy acute   . HCAP (healthcare-associated pneumonia) 07/24/2016  . Bradycardia 06/25/2016  . Alteration in self-care ability 03/05/2016  . Hx of subdural hematoma 03/05/2016  . 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 NSTEMI (non-ST elevated myocardial infarction) ([place]) 02/26/2016  . H/O non-ST elevation myocardial infarction (NSTEMI) 01/10/2016  . S/P coronary artery stent placement 01/10/2016  . Depression, major, single episode, complete remission ([place]) 07/22/2015  . Acute respiratory failure with hypoxia ([place]) 02/14/2015  . Syncope 11/05/2014  . Acute cystitis with hematuria 10/11/2014  . Acute subdural hematoma ([place]) 10/10/2014  . Anemia in chronic kidney disease (CKD) 10/07/2014  . ESRD on hemodialysis ([place]) 10/07/2014  . Mixed hyperlipidemia 10/07/2014  . 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 Contaminant given to patient   . Shortness of breath   . Viridans streptococci infection   . Polymicrobial bacterial infection   . Bacteremia   . 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 ESRD on dialysis ([place])   . Type 1 diabetes mellitus with other diabetic kidney complication ([place])   . Acute coronary syndrome ([place]) 09/29/2014  . Anemia associated with chronic renal failure 03/25/2014  . Nausea & vomiting  03/25/2014  . Gout 03/17/2012  . H/O: upper GI bleed 03/17/2012  . Hyperlipidemia, unspecified 03/17/2012  . Kidney transplant status, cadaveric 07/16/2002    Past Surgical History:  Procedure Laterality Date  . ARTERIOVENOUS GRAFT PLACEMENT Left ~ 1996  . CARDIAC CATHETERIZATION  09/29/2014   "[place]"  . CARDIAC CATHETERIZATION N/A  02/26/2016   Procedure: Left Heart Cath and Coronary Angiography Possible PCI;  Surgeon: [name], MD;  Location: [place] CV LAB;  Service: Cardiovascular;  Laterality: N/A;  . ENDOSCOPIC RETROGRADE CHOLANGIOPANCREATOGRAPHY (ERCP) WITH PROPOFOL N/A 09/17/2016   Procedure: ENDOSCOPIC RETROGRADE CHOLANGIOPANCREATOGRAPHY (ERCP) WITH PROPOFOL;  Surgeon: [name], MD;  Location: [facility] ENDOSCOPY;  Service: Endoscopy;  Laterality: N/A;  . ERCP N/A 09/10/2016   Procedure: ENDOSCOPIC RETROGRADE CHOLANGIOPANCREATOGRAPHY (ERCP);  Surgeon: [name], MD;  Location: [facility] ENDOSCOPY;  Service: Endoscopy;  Laterality: N/A;  . KIDNEY TRANSPLANT Right 2004  . NEPHRECTOMY TRANSPLANTED ORGAN  2015  . PERCUTANEOUS CORONARY STENT INTERVENTION (PCI-S) N/A 09/30/2014   Procedure: PERCUTANEOUS CORONARY STENT INTERVENTION (PCI-S);  Surgeon: [name], MD;  Location: [facility] CATH LAB;  Service: Cardiovascular;  Laterality: N/A;  . PERITONEAL CATHETER INSERTION  02/2014  . PERITONEAL CATHETER REMOVAL  08/2014  . THROMBECTOMY / ARTERIOVENOUS GRAFT REVISION  2015    Prior to Admission medications   Medication Sig Start Date End Date Taking? Authorizing Provider  allopurinol (ZYLOPRIM) 100 MG tablet Take 100 mg by mouth daily. 09/23/14  Yes [provider]  aspirin 81 MG EC tablet Take 81 mg by mouth daily. 09/23/14  Yes [provider]  atorvastatin (LIPITOR) 80 MG tablet Take 80 mg by mouth daily. 09/18/18  Yes [provider]  calcium acetate (PHOSLO) 667 MG capsule Take 1,334 mg by mouth 3 (three) times daily with meals.   Yes [provider]  clopidogrel (PLAVIX) 75 MG tablet Take 1 tablet (75 mg total) by mouth daily with breakfast. 10/02/14  Yes [name], MD  levETIRAcetam (KEPPRA) 250 MG tablet Take 250-750 mg by mouth daily. Takes 250 mg on dialysis days (tues, thurs, sat) Takes 750 mg qd.   Yes [provider]  losartan (COZAAR) 25 MG tablet Take 1 tablet (25  mg total) by mouth daily. 09/18/16  Yes Demetrios Loll, MD  memantine [facility]) 5 MG tablet Take 10 mg by mouth daily.   Yes [provider]  metoprolol succinate (TOPROL-XL) 25 MG 24 hr tablet Take 12.5-25 mg by mouth See admin instructions. Take 25 mg by mouth on non-dialysis days and 12.5 mg on dialysis days (Tuesday, Thursday and Saturday) after dialysis.    Yes [provider]  mirtazapine (REMERON) 15 MG tablet Take 15 mg by mouth at bedtime.   Yes [provider]  nitroGLYCERIN (NITROSTAT) 0.4 MG SL tablet Place 1 tablet (0.4 mg total) under the tongue every 5 (five) minutes x 3 doses as needed for chest pain. 02/26/18   Demetrios Loll, MD    Allergies Ivp dye [iodinated diagnostic agents] and Metrizamide  Family History  Problem Relation Age of Onset  . Hypertension Mother   . Stroke Mother   . Hypertension Father   . Diabetes Mellitus II Father   . Asthma Father     Social History Social History   Tobacco Use  . Smoking status: Former Smoker    Types: Cigarettes  . Smokeless tobacco: Never Used  . Tobacco comment: "quit smoking cigarettes in the 1980's"  Substance Use Topics  . Alcohol use: No  .  Drug use: No    Review of Systems  Constitutional: + fever. Respiratory: Negative for shortness of breath. + cough Gastrointestinal: Negative for vomiting or diarrhea.  ____________________________________________   PHYSICAL EXAM:  VITAL SIGNS: ED Triage Vitals  Enc Vitals Group     BP --      Pulse Rate 04/26/19 0004 89     Resp 04/26/19 0004 (!) 28     Temp 04/26/19 0004 (!) 101 F (38.3 C)     Temp Source 04/26/19 0004 Axillary     SpO2 04/26/19 0004 99 %     Weight 04/26/19 0007 170 lb (77.1 kg)     Height 04/26/19 0007 5\' 3"  (1.6 m)     Head Circumference --      Peak Flow --      Pain Score 04/26/19 0006 0     Pain Loc --      Pain Edu? --      Excl. in [place]? --     Constitutional: Awake, will tell his name but not answer any other  questions, no distress, actively coughing. HEENT:      Head: Normocephalic and atraumatic.         Eyes: Conjunctivae are normal. Sclera is non-icteric.       Mouth/Throat: Mucous membranes are moist.       Neck: Supple with no signs of meningismus. Cardiovascular: Regular rate and rhythm. No murmurs, gallops, or rubs. 2+ symmetrical distal pulses are present in all extremities. No JVD. Respiratory: Slightly increased respiratory effort with tachypnea but satting 99% on room air, patient is unable to follow commands of taking a big breath and therefore exam is limited due to shallow breathing Gastrointestinal: Soft, non tender, and non distended with positive bowel sounds. No rebound or guarding. Musculoskeletal: No edema, cyanosis, or erythema of extremities.  Strong distal pulses Neurologic: GCS 12 Skin: Skin is warm, dry and intact. No rash noted.  ____________________________________________   LABS (all labs ordered are listed, but only abnormal results are displayed)  Labs Reviewed  CBC WITH DIFFERENTIAL/PLATELET - Abnormal; Notable for the following components:      Result Value   WBC 11.6 (*)    Hemoglobin 11.2 (*)    HCT 35.7 (*)    MCV 73.0 (*)    MCH 22.9 (*)    RDW 15.6 (*)    Neutro Abs 9.5 (*)    All other components within normal limits  COMPREHENSIVE METABOLIC PANEL - Abnormal; Notable for the following components:   Chloride 95 (*)    Glucose, Bld 128 (*)    BUN 49 (*)    Creatinine, Ser 10.70 (*)    GFR calc non Af Amer 4 (*)    GFR calc Af Amer 5 (*)    All other components within normal limits  TROPONIN I (HIGH SENSITIVITY) - Abnormal; Notable for the following components:   Troponin I (High Sensitivity) 37 (*)    All other components within normal limits  SARS CORONAVIRUS 2 (TAT 6-24 HRS)  CULTURE, BLOOD (ROUTINE X 2)  CULTURE, BLOOD (ROUTINE X 2)  LACTIC ACID, PLASMA  INFLUENZA PANEL BY PCR (TYPE A & B)  PROCALCITONIN  URINALYSIS, ROUTINE W REFLEX  MICROSCOPIC   ____________________________________________  EKG  ED ECG REPORT I, Rudene Re, the attending physician, personally viewed and interpreted this ECG.  Normal sinus rhythm, rate of 88, normal intervals, normal axis, no ST elevations or depressions.  No significant changes when compared to prior.  ____________________________________________  RADIOLOGY  I have personally reviewed the images performed during this visit and I agree with the Radiologist's read.   Interpretation by Radiologist:  Dg Chest Portable 1 View  Result Date: 04/26/2019 CLINICAL DATA:  Firmness of breath EXAM: PORTABLE CHEST 1 VIEW COMPARISON:  Nov 13, 2018 FINDINGS: There is mild cardiomegaly. Overlying median sternotomy wires. Prominence to the central pulmonary vasculature seen. No large airspace consolidation. There is blunting of the left costophrenic angle which could be due to a trace effusion. No acute osseous abnormality. Vascular stent seen within the left arm. IMPRESSION: Mild cardiomegaly and pulmonary vascular congestion. Probable trace left pleural effusion. Electronically Signed   By: Prudencio Pair M.D.   On: 04/26/2019 00:29     ____________________________________________   PROCEDURES  Procedure(s) performed: None Procedures Critical Care performed: yes  CRITICAL CARE Performed by: Rudene Re  ?  Total critical care time: 35 min  Critical care time was exclusive of separately billable procedures and treating other patients.  Critical care was necessary to treat or prevent imminent or life-threatening deterioration.  Critical care was time spent personally by me on the following activities: development of treatment plan with patient and/or surrogate as well as nursing, discussions with consultants, evaluation of patient's response to treatment, examination of patient, obtaining history from patient or surrogate, ordering and performing treatments and interventions,  ordering and review of laboratory studies, ordering and review of radiographic studies, pulse oximetry and re-evaluation of patient's condition.  ____________________________________________   INITIAL IMPRESSION / ASSESSMENT AND PLAN / ED COURSE  68 y.o. male with a history of diastolic CHF, ESRD on HD (TTS), last treatment 2 days ago, hypertension, diabetes, CVA with a decrease cognition CAD status post CABG presents  for evaluation of cough and fever.  Patient arrives currently at baseline per wife with a fever of 101F, patient with a productive cough but no significant respiratory distress other than mild tachypnea, satting well on room air.  Chest x-ray is inconclusive due to pulmonary vascular congestion on this patient with end-stage renal disease and history of CHF.  Presentation concerning for pneumonia versus Covid versus flu.  Covid and flu swab pending. Labs and cultures pending. Will give tylenol for fever. Will hold IVF due to h/o ESRD and edema on CXR, BP is stable. Will cover with broad spectrum abx and admit to Hospitalist.     _________________________ 2:58 AM on 04/26/2019 -----------------------------------------  Patient meets sepsis criteria with fever, tachypnea, leukocytosis.  Labs with no indication for emergent dialysis.  Flu negative.  Covid is pending.  Will admit to the hospitalist service.    As part of my medical decision making, I reviewed the following data within the [place] notes reviewed and incorporated, Labs reviewed , EKG interpreted , Old EKG reviewed, Old chart reviewed, Radiograph reviewed , Notes from prior ED visits and [place] Controlled Substance Database   Patient was evaluated in Emergency Department today for the symptoms described in the history of present illness. Patient was evaluated in the context of the global COVID-19 pandemic, which necessitated consideration that the patient might be at risk for infection with the  SARS-CoV-2 virus that causes COVID-19. Institutional protocols and algorithms that pertain to the evaluation of patients at risk for COVID-19 are in a state of rapid change based on information released by regulatory bodies including the CDC and federal and state organizations. These policies and algorithms were followed during the patient's care in the ED.  ____________________________________________   FINAL CLINICAL IMPRESSION(S) / ED DIAGNOSES   Final diagnoses:  Sepsis without acute organ dysfunction, due to unspecified organism [facility])  Community acquired pneumonia, unspecified laterality      NEW MEDICATIONS STARTED DURING THIS VISIT:  ED Discharge Orders    None       Note:  This document was prepared using Dragon voice recognition software and may include unintentional dictation errors.    Rudene Re, MD 04/26/19 [phone]

## 2019-04-26 NOTE — ED Notes (Signed)
Pt able to state name. Pt with wet sounding cough. Pt with O2 2 L [place] , O2 sat 98%

## 2019-04-26 NOTE — ED Notes (Signed)
Pt able to state name, hands contracted, responds to pain and verbal commands. EDP spoke with family via phone. Family states that pt does not talk much.

## 2019-04-26 NOTE — ED Notes (Signed)
This RN attempted IV access with no success 

## 2019-04-26 NOTE — Evaluation (Addendum)
Clinical/Bedside Swallow Evaluation Patient Details  Name: [name] MRN: [id number] Date of Birth: 22-Sep-1950  Today's Date: 04/26/2019 Time: SLP Start Time (ACUTE ONLY): [id number] SLP Stop Time (ACUTE ONLY): 1445 SLP Time Calculation (min) (ACUTE ONLY): 60 min  Past Medical History:  Past Medical History:  Diagnosis Date  . Chronic diastolic congestive heart failure ([place])   . Chronic disease anemia   . ESRD (end stage renal disease) on dialysis ([place])    "[place]; [place]; [name]" (09/29/2014)  . GERD (gastroesophageal reflux disease)   . Gout   . High cholesterol   . History of blood transfusion    "related to anemia"  . History of stomach ulcers   . Hypertension   . Type II diabetes mellitus ([place])    Past Surgical History:  Past Surgical History:  Procedure Laterality Date  . ARTERIOVENOUS GRAFT PLACEMENT Left ~ 1996  . CARDIAC CATHETERIZATION  09/29/2014   "[place]"  . CARDIAC CATHETERIZATION N/A 02/26/2016   Procedure: Left Heart Cath and Coronary Angiography Possible PCI;  Surgeon: [name], MD;  Location: [place] [facility];  Service: Cardiovascular;  Laterality: N/A;  . ENDOSCOPIC RETROGRADE CHOLANGIOPANCREATOGRAPHY (ERCP) WITH PROPOFOL N/A 09/17/2016   Procedure: ENDOSCOPIC RETROGRADE CHOLANGIOPANCREATOGRAPHY (ERCP) WITH PROPOFOL;  Surgeon: [name], MD;  Location: [facility] ENDOSCOPY;  Service: Endoscopy;  Laterality: N/A;  . ERCP N/A 09/10/2016   Procedure: ENDOSCOPIC RETROGRADE CHOLANGIOPANCREATOGRAPHY (ERCP);  Surgeon: [name], MD;  Location: [facility] ENDOSCOPY;  Service: Endoscopy;  Laterality: N/A;  . KIDNEY TRANSPLANT Right 2004  . NEPHRECTOMY TRANSPLANTED ORGAN  2015  . PERCUTANEOUS CORONARY STENT INTERVENTION (PCI-S) N/A 09/30/2014   Procedure: PERCUTANEOUS CORONARY STENT INTERVENTION (PCI-S);  Surgeon: [name], MD;  Location: [facility] CATH LAB;  Service: Cardiovascular;  Laterality: N/A;  . PERITONEAL CATHETER INSERTION  02/2014  . PERITONEAL CATHETER REMOVAL   08/2014  . THROMBECTOMY / ARTERIOVENOUS GRAFT REVISION  2015   HPI:  Pt  is a 68 y.o. male with a known history of MULTIPLE MEDICAL ISSUES including end-stage renal disease on hemodialysis on Tuesday Thursday and Saturday, chronic diastolic CHF, coronary artery disease status post CABG, dyslipidemia, hypertension, CVA with a decrease Cognition status, and type 2 diabetes mellitus, who presented to the emergency room with acute onset of cough with associated fever per the patient's wife.    Assessment / Plan / Recommendation Clinical Impression  Pt appears to present w/ Min+ oral phase dysphagia and grossly adequate pharyngeal phase swallowing function w/ No immediate, overt clinical s/s of aspiration w/ po trials w/ po trials including thin liquids at this assessment today. He appeared weak w/ min stiffness and did not help to sit up; he exhibited Min+ Cognitive decline (baseline) noted. This can increase risk for aspiration thus Pulmonary decline. During this assessment w/ use of [place] Interpreter, pt was positioned upright for po trials. No immediate, overt clinical s/s of aspiration noted w/ po trials of thin liquids VIA CUP(he helped to hold to lessen risk for aspiration), purees and softened solids - no decline in respiratory status during/post trials; no immediate coughing or wet vocal quality when he verbally engaged w/ SLP and Interpreter. Oral phase was min+ slower during the thorough mastication(munching) of the increased textured foods - pt did best w/ the the solids cut small and moistened well w/ puree. No signfiicant oral residue left; pt used lingual sweeping and smacking w/ Time given to aid clearing. Puree and well-broken down foods will provide much easier mastication/oral clearing for pt. Timely management  noted w/ the thin liquids - VIA CUP w/ small, single sips to limit bolus size. OM exam appeared grossly [facility] w/ no unilateral weakness assessed. Pt required full positioning upright and  feeding support but he could help to hold cup when drinking - this can lessen risk for aspiration.  Recommend a Dysphagia level 3 (w/ minced meats) w/ thin liquids VIA CUP careful to monitor for toleration of thin liquids following general aspiration precautions in light of weakness and Cognitive Impairment. Recommmend Pills in Puree - whole or crushed as needed for ease/safety. Feeding support at all meals. ST services can be available for further education on precautions if needed. NSG updated.  SLP Visit Diagnosis: Dysphagia, oral phase (R13.11)(min; generalized weakness)    Aspiration Risk  Mild aspiration risk(but reduced following precautions )    Diet Recommendation  Dysphagia level 3 (mech soft but w/ Meats more Minced w/ gravies to moisten); thin liquids - NO STRAWS. General aspiration precautions; Supervision and assistance w/ feeding at meals - pt to [place] when drinking to lessen risk for aspiration  Medication Administration: Whole meds with puree(vs Crushed as needed for safer swallowing)    Other  Recommendations Recommended Consults: (Dietician f/u) Oral Care Recommendations: Oral care BID;Staff/trained caregiver to provide oral care;Oral care before and after PO Other Recommendations: (n/a at this time)   Follow up Recommendations None      Frequency and Duration (n/a)  (n/a)       Prognosis Prognosis for Safe Diet Advancement: Fair(-Good) Barriers to Reach Goals: Cognitive deficits;Time post onset;Severity of deficits(baseline illness/weakness)      Swallow Study   General Date of Onset: 04/26/19 HPI: Pt  is a 69 y.o. male with a known history of MULTIPLE MEDICAL ISSUES including end-stage renal disease on hemodialysis on Tuesday Thursday and Saturday, chronic diastolic CHF, coronary artery disease status post CABG, dyslipidemia, hypertension, CVA with a decrease Cognition status, and type 2 diabetes mellitus, who presented to the emergency room with acute onset  of cough with associated fever per the patient's wife.  Type of Study: Bedside Swallow Evaluation Previous Swallow Assessment: 02/2018; 11/2014 Diet Prior to this Study: Thin liquids(soft foods per pt report) Temperature Spikes Noted: No(wbc 13.5 w/ no current temp) Respiratory Status: Nasal cannula(3L - off (under mask) upon entering room ) History of Recent Intubation: No Behavior/Cognition: Alert;Cooperative;Pleasant mood;Distractible;Requires cueing(labile at end of session) Oral Cavity Assessment: Dry(sticky) Oral Care Completed by SLP: Yes Oral Cavity - Dentition: Adequate natural dentition Vision: Functional for self-feeding Self-Feeding Abilities: Able to feed self;Needs assist;Needs set up;Total assist(overall weakness) Patient Positioning: Upright in bed(needed full positioning upright ) Baseline Vocal Quality: Normal;Low vocal intensity(for the Interpreter and MD(knew pt from office)) Volitional Cough: Strong(-Fair) Volitional Swallow: Able to elicit    Oral/Motor/Sensory Function Overall Oral Motor/Sensory Function: Within functional limits(appeared - no unilateral weakness)   Ice Chips Ice chips: Within functional limits Presentation: Spoon(fed; 3 trials) Other Comments: pt chewed them when small enough   Thin Liquid Thin Liquid: Within functional limits Presentation: Cup;Self Fed(assisted by SLP; 10 trials) Other Comments: NO Straw    Nectar Thick Nectar Thick Liquid: Not tested   Honey Thick Honey Thick Liquid: Not tested   Puree Puree: Within functional limits Presentation: Spoon(fed; 9 trials)   Solid     Solid: Impaired(min) Presentation: Spoon(fed; 6 trials) Oral Phase Impairments: Impaired mastication(min slower) Oral Phase Functional Implications: Impaired mastication(min slower) Pharyngeal Phase Impairments: (none) Other Comments: given time and small bites, pt cleared appropriately  Orinda Kenner, MS, CCC-SLP Watson,Katherine 04/26/2019,5:38  PM

## 2019-04-26 NOTE — Progress Notes (Signed)
[facility]  ROUNDING NOTE   Subjective:   [name] admitted to [facility] on 04/25/2019 for Shortness of breath, suspected covid Last hemodialysis was Saturday.   Patient slow to respond this morning. Seems that interpreter was required to get full history.   Objective:  Vital signs in last 24 hours:  Temp:  [99.2 F (37.3 C)-101 F (38.3 C)] 99.2 F (37.3 C) (11/09 0647) Pulse Rate:  [64-93] 67 (11/09 1100) Resp:  [17-29] 17 (11/09 1430) BP: (115-181)/(61-98) 137/64 (11/09 1430) SpO2:  [97 %-100 %] 100 % (11/09 1100) Weight:  [77.1 kg] 77.1 kg (11/09 0007)  Weight change:  Filed Weights   04/26/19 0007  Weight: 77.1 kg    Intake/Output: No intake/output data recorded.   Intake/Output this shift:  Total I/O In: 106.7 [IV Piggyback:106.7] Out: -   Physical Exam: General: NAD,   Head: Normocephalic, atraumatic. Moist oral mucosal membranes  Eyes: Anicteric, PERRL  Neck: Supple, trachea midline  Lungs:  Bilateral crackles  Heart: Regular rate and rhythm  Abdomen:  Soft, nontender,   Extremities:  no peripheral edema.  Neurologic: Nonfocal, moving all four extremities  Skin: No lesions  Access: Left AVG    Basic Metabolic Panel: Recent Labs  Lab 04/26/19 0139 04/26/19 0411  NA 138 138  K 4.1 4.5  CL 95* 97*  CO2 29 28  GLUCOSE 128* 149*  BUN 49* 49*  CREATININE 10.70* 10.96*  CALCIUM 9.0 9.1    Liver Function Tests: Recent Labs  Lab 04/26/19 0139  AST 18  ALT 13  ALKPHOS 84  BILITOT 0.7  PROT 7.8  ALBUMIN 3.6   No results for input(s): LIPASE, AMYLASE in the last 168 hours. No results for input(s): AMMONIA in the last 168 hours.  CBC: Recent Labs  Lab 04/26/19 0139 04/26/19 0411  WBC 11.6* 13.5*  NEUTROABS 9.5*  --   HGB 11.2* 11.3*  HCT 35.7* 35.7*  MCV 73.0* 73.0*  PLT 185 147*    Cardiac Enzymes: No results for input(s): CKTOTAL, CKMB, CKMBINDEX, TROPONINI in the last 168 hours.  BNP: Invalid input(s):  POCBNP  CBG: Recent Labs  Lab 04/26/19 0949 04/26/19 1353  GLUCAP 102* 96    Microbiology: Results for orders placed or performed during the hospital encounter of 04/25/19  SARS CORONAVIRUS 2 (TAT 6-24 HRS) Nasopharyngeal Nasopharyngeal Swab     Status: None   Collection Time: 04/26/19 12:26 AM   Specimen: Nasopharyngeal Swab  Result Value Ref Range Status   SARS Coronavirus 2 NEGATIVE NEGATIVE Final    Comment: (NOTE) SARS-CoV-2 target nucleic acids are NOT DETECTED. The SARS-CoV-2 RNA is generally detectable in upper and lower respiratory specimens during the acute phase of infection. Negative results do not preclude SARS-CoV-2 infection, do not rule out co-infections with other pathogens, and should not be used as the sole basis for treatment or other patient management decisions. Negative results must be combined with clinical observations, patient history, and epidemiological information. The expected result is Negative. Fact Sheet for Patients: SugarRoll.be Fact Sheet for Healthcare Providers: https://www.woods-mathews.com/ This test is not yet approved or cleared by the [place] FDA and  has been authorized for detection and/or diagnosis of SARS-CoV-2 by FDA under an Emergency Use Authorization (EUA). This EUA will remain  in effect (meaning this test can be used) for the duration of the COVID-19 declaration under Section 56 4(b)(1) of the Act, 21 U.S.C. section 360bbb-3(b)(1), unless the authorization is terminated or revoked sooner. Performed at [facility]  [facility], [place] [address] [place], [place] [address]   Blood culture (routine x 2)     Status: None (Preliminary result)   Collection Time: 04/26/19  1:39 AM   Specimen: BLOOD  Result Value Ref Range Status   Specimen Description BLOOD RIGHT HAND  Final   Special Requests   Final    BOTTLES DRAWN AEROBIC AND ANAEROBIC Blood Culture results may not be optimal due to  an inadequate volume of blood received in culture bottles   Culture   Final    NO GROWTH < 12 HOURS Performed at [facility], [address] [place], [place] [address]    Report Status PENDING  Incomplete  Blood culture (routine x 2)     Status: None (Preliminary result)   Collection Time: 04/26/19  2:25 AM   Specimen: BLOOD  Result Value Ref Range Status   Specimen Description BLOOD RIGHT HAND  Final   Special Requests   Final    BOTTLES DRAWN AEROBIC AND ANAEROBIC Blood Culture adequate volume   Culture   Final    NO GROWTH < 12 HOURS Performed at [facility], [address] [place],  [address]    Report Status PENDING  Incomplete    Coagulation Studies: No results for input(s): LABPROT, INR in the last 72 hours.  Urinalysis: No results for input(s): COLORURINE, LABSPEC, PHURINE, GLUCOSEU, HGBUR, BILIRUBINUR, KETONESUR, PROTEINUR, UROBILINOGEN, NITRITE, LEUKOCYTESUR in the last 72 hours.  Invalid input(s): APPERANCEUR    Imaging: Dg Chest Portable 1 View  Result Date: 04/26/2019 CLINICAL DATA:  Firmness of breath EXAM: PORTABLE CHEST 1 VIEW COMPARISON:  Nov 13, 2018 FINDINGS: There is mild cardiomegaly. Overlying median sternotomy wires. Prominence to the central pulmonary vasculature seen. No large airspace consolidation. There is blunting of the left costophrenic angle which could be due to a trace effusion. No acute osseous abnormality. Vascular stent seen within the left arm. IMPRESSION: Mild cardiomegaly and pulmonary vascular congestion. Probable trace left pleural effusion. Electronically Signed   By: Prudencio Pair M.D.   On: 04/26/2019 00:29     Medications:   . [START ON 04/27/2019] azithromycin    . cefTRIAXone (ROCEPHIN)  IV 2 g (04/26/19 1358)  . levETIRAcetam Stopped (04/26/19 1006)   And  . [START ON 04/27/2019] levETIRAcetam     . acetaminophen  1,000 mg Oral Once  . furosemide  40 mg Intravenous Q12H  . heparin injection  (subcutaneous)  5,000 Units Subcutaneous Q8H  . insulin aspart  0-15 Units Subcutaneous TID WC   acetaminophen **OR** acetaminophen, bisacodyl, ondansetron **OR** ondansetron (ZOFRAN) IV  Assessment/ Plan:  [name] is a 68 y.o. Asian American ([place]) male with end stage renal disease on hemodialysis, history of renal transplant, hypertension, diabetes mellitus type II, peptic ulcer disease, gout, diastolic congestive heart failure. Admitted to [facility] on 04/25/2019 for Shortness of breath, suspected covid  [facility] [address] TTS 195 minutes 71.5kg Left AVG  1. End Stage Renal Disease: last hemodialysis treatment was Saturday. Currently 6 kg over his estimated dry weight.  No indication for acute dialysis treatment today.  - Plan on hemodialysis treatment tomorrow and resume TTS schedule  2. Hypertension: well controlled. Home regimen of losartan and metoprolol - midodrine before dialysis  3. Anemia of chronic kidney disease: hemoglobin 11.3. EPO as outpatient.   4. Secondary Hyperparathyroidism: outpatient labs at goal.  - calcium acetate with meals.    LOS: 0 Shena Vinluan 11/9/20203:11 PM

## 2019-04-26 NOTE — Progress Notes (Signed)
CODE SEPSIS - PHARMACY COMMUNICATION  **Broad Spectrum Antibiotics should be administered within 1 hour of Sepsis diagnosis**  Time Code Sepsis Called/Page Received: 0257  Antibiotics Ordered: cefepime 1g   Time of 1st antibiotic administration: 0223  Additional action taken by pharmacy:   If necessary, Name of Provider/Nurse Contacted:     Tobie Lords ,PharmD Clinical Pharmacist  04/26/2019  2:59 AM

## 2019-04-26 NOTE — H&P (Signed)
[place] [facility] [place] NAME: Tru Rover    MR#:  [id number]  DATE OF BIRTH:  1950/10/20  DATE OF ADMISSION:  04/25/2019  PRIMARY CARE PHYSICIAN: Kirk Ruths, MD   REQUESTING/REFERRING PHYSICIAN: Gonzella Lex, MD  CHIEF COMPLAINT:   Chief Complaint  Patient presents with  . Shortness of Breath    HISTORY OF PRESENT ILLNESS:  [name]  is a 68 y.o. male with a known history of end-stage renal disease on hemodialysis on Tuesday Thursday and Saturday, chronic diastolic CHF, coronary artery disease status post CABG, dyslipidemia, hypertension and type 2 diabetes mellitus, who presented to the emergency room with acute onset of cough with associated fever per the patient's wife who gave history of the phone earlier.  He has been at his baseline mental status being unable to provide any further history.  The patient's wife give him a bath this evening and after that he felt so warm to touch and started coughing with congested very productive cough.  No hemoptysis.  No nausea vomiting or abdominal pain.  No recent exposure to COVID-19.  Upon presentation to the emergency room temperature was 101 with respiratory rate of 28 and pulse ox 90 was 99% on 3 L of O2 by nasal cannula.  A BUN of 49 and a creatinine of 10.7 and high-sensitivity troponin I 37 with procalcitonin of 0.21, mild leukocytosis with anemia.  Influenza a and B antigens came back negative blood cultures were drawn.  COVID-19 test is currently pending.  EKG showed normal sinus rhythm with a rate of 88 with poor R wave progression.  Portable chest ray showed mild cardiomegaly and pulmonary vascular congestion and probable trace left pleural effusion.  The patient was given 650 mg rectal Tylenol as well as IV cefepime, vancomycin and Zithromax.  He will be admitted to the medical monitored bed for further evaluation and management. PAST MEDICAL HISTORY:   Past Medical History:  Diagnosis Date   . Chronic diastolic congestive heart failure ([place])   . Chronic disease anemia   . ESRD (end stage renal disease) on dialysis ([place])    "[place]; [place]; [name]" (09/29/2014)  . GERD (gastroesophageal reflux disease)   . Gout   . High cholesterol   . History of blood transfusion    "related to anemia"  . History of stomach ulcers   . Hypertension   . Type II diabetes mellitus ([place])     PAST SURGICAL HISTORY:   Past Surgical History:  Procedure Laterality Date  . ARTERIOVENOUS GRAFT PLACEMENT Left ~ 1996  . CARDIAC CATHETERIZATION  09/29/2014   "[place]"  . CARDIAC CATHETERIZATION N/A 02/26/2016   Procedure: Left Heart Cath and Coronary Angiography Possible PCI;  Surgeon: [name], MD;  Location: [facility];  Service: Cardiovascular;  Laterality: N/A;  . ENDOSCOPIC RETROGRADE CHOLANGIOPANCREATOGRAPHY (ERCP) WITH PROPOFOL N/A 09/17/2016   Procedure: ENDOSCOPIC RETROGRADE CHOLANGIOPANCREATOGRAPHY (ERCP) WITH PROPOFOL;  Surgeon: [name], MD;  Location: [facility] ENDOSCOPY;  Service: Endoscopy;  Laterality: N/A;  . ERCP N/A 09/10/2016   Procedure: ENDOSCOPIC RETROGRADE CHOLANGIOPANCREATOGRAPHY (ERCP);  Surgeon: [name], MD;  Location: [facility] ENDOSCOPY;  Service: Endoscopy;  Laterality: N/A;  . KIDNEY TRANSPLANT Right 2004  . NEPHRECTOMY TRANSPLANTED ORGAN  2015  . PERCUTANEOUS CORONARY STENT INTERVENTION (PCI-S) N/A 09/30/2014   Procedure: PERCUTANEOUS CORONARY STENT INTERVENTION (PCI-S);  Surgeon: [name], MD;  Location: [facility] CATH LAB;  Service: Cardiovascular;  Laterality: N/A;  . PERITONEAL CATHETER INSERTION  02/2014  . PERITONEAL CATHETER REMOVAL  08/2014  . THROMBECTOMY / ARTERIOVENOUS GRAFT REVISION  2015    SOCIAL HISTORY:   Social History   Tobacco Use  . Smoking status: Former Smoker    Types: Cigarettes  . Smokeless tobacco: Never Used  . Tobacco comment: "quit smoking cigarettes in the 1980's"  Substance Use Topics  . Alcohol use: No    FAMILY  HISTORY:   Family History  Problem Relation Age of Onset  . Hypertension Mother   . Stroke Mother   . Hypertension Father   . Diabetes Mellitus II Father   . Asthma Father     DRUG ALLERGIES:   Allergies  Allergen Reactions  . Ivp Dye [Iodinated Diagnostic Agents] Other (See Comments)    Pt denied  . Metrizamide Other (See Comments)    Contains iodine    REVIEW OF SYSTEMS:   ROS As per history of present illness. All pertinent systems were reviewed above. Constitutional,  HEENT, cardiovascular, respiratory, GI, GU, musculoskeletal, neuro, psychiatric, endocrine,  integumentary and hematologic systems were reviewed and are otherwise  negative/unremarkable except for positive findings mentioned above in the HPI.   MEDICATIONS AT HOME:   Prior to Admission medications   Medication Sig Start Date End Date Taking? Authorizing Provider  allopurinol (ZYLOPRIM) 100 MG tablet Take 100 mg by mouth daily. 09/23/14  Yes [provider]  aspirin 81 MG EC tablet Take 81 mg by mouth daily. 09/23/14  Yes [provider]  atorvastatin (LIPITOR) 80 MG tablet Take 80 mg by mouth daily. 09/18/18  Yes [provider]  calcium acetate (PHOSLO) 667 MG capsule Take 1,334 mg by mouth 3 (three) times daily with meals.   Yes [provider]  clopidogrel (PLAVIX) 75 MG tablet Take 1 tablet (75 mg total) by mouth daily with breakfast. 10/02/14  Yes [name], MD  levETIRAcetam (KEPPRA) 250 MG tablet Take 250-750 mg by mouth daily. Takes 250 mg on dialysis days (tues, thurs, sat) Takes 750 mg qd.   Yes [provider]  losartan (COZAAR) 25 MG tablet Take 1 tablet (25 mg total) by mouth daily. 09/18/16  Yes Demetrios Loll, MD  memantine [facility]) 5 MG tablet Take 10 mg by mouth daily.   Yes [provider]  metoprolol succinate (TOPROL-XL) 25 MG 24 hr tablet Take 12.5-25 mg by mouth See admin instructions. Take 25 mg by mouth on non-dialysis days and 12.5 mg  on dialysis days (Tuesday, Thursday and Saturday) after dialysis.    Yes [provider]  mirtazapine (REMERON) 15 MG tablet Take 15 mg by mouth at bedtime.   Yes [provider]  nitroGLYCERIN (NITROSTAT) 0.4 MG SL tablet Place 1 tablet (0.4 mg total) under the tongue every 5 (five) minutes x 3 doses as needed for chest pain. 02/26/18   Demetrios Loll, MD      VITAL SIGNS:  Blood pressure 134/73, pulse 71, temperature (!) 101 F (38.3 C), temperature source Axillary, resp. rate 19, height 5\' 3"  (1.6 m), weight 77.1 kg, SpO2 100 %.  PHYSICAL EXAMINATION:  Physical Exam  GENERAL:  68 y.o.-year-old male patient lying in the bed with mild respiratory distress EYES: Pupils equal, round, reactive to light and accommodation. No scleral icterus. Extraocular muscles intact.  HEENT: Head atraumatic, normocephalic. Oropharynx and nasopharynx clear.  NECK:  Supple, no jugular venous distention. No thyroid enlargement, no tenderness.  LUNGS: Diminished bibasilar breath sounds with bibasal crackles.   CARDIOVASCULAR: Regular rate and rhythm,  S1, S2 normal. No murmurs, rubs, or gallops.  ABDOMEN: Soft, nondistended, nontender. Bowel sounds present. No organomegaly or mass.  EXTREMITIES: No pedal edema, cyanosis, or clubbing.  NEUROLOGIC: Cranial nerves II through XII are intact. Muscle strength 5/5 in all extremities. Sensation intact. Gait not checked.  PSYCHIATRIC: The patient is alert and oriented x 3.  Normal affect and good eye contact. SKIN: No obvious rash, lesion, or ulcer.   LABORATORY PANEL:   CBC Recent Labs  Lab 04/26/19 0411  WBC 13.5*  HGB 11.3*  HCT 35.7*  PLT 147*   ------------------------------------------------------------------------------------------------------------------  Chemistries  Recent Labs  Lab 04/26/19 0139 04/26/19 0411  NA 138 138  K 4.1 4.5  CL 95* 97*  CO2 29 28  GLUCOSE 128* 149*  BUN 49* 49*  CREATININE 10.70* 10.96*  CALCIUM 9.0  9.1  AST 18  --   ALT 13  --   ALKPHOS 84  --   BILITOT 0.7  --    ------------------------------------------------------------------------------------------------------------------  Cardiac Enzymes No results for input(s): TROPONINI in the last 168 hours. ------------------------------------------------------------------------------------------------------------------  RADIOLOGY:  Dg Chest Portable 1 View  Result Date: 04/26/2019 CLINICAL DATA:  Firmness of breath EXAM: PORTABLE CHEST 1 VIEW COMPARISON:  Nov 13, 2018 FINDINGS: There is mild cardiomegaly. Overlying median sternotomy wires. Prominence to the central pulmonary vasculature seen. No large airspace consolidation. There is blunting of the left costophrenic angle which could be due to a trace effusion. No acute osseous abnormality. Vascular stent seen within the left arm. IMPRESSION: Mild cardiomegaly and pulmonary vascular congestion. Probable trace left pleural effusion. Electronically Signed   By: Prudencio Pair M.D.   On: 04/26/2019 00:29      IMPRESSION AND PLAN:   1.  Suspected community-acquired pneumonia with subsequent sepsis without severe sepsis or septic shock.  I suspect this is related to strept. pneumonia.  The patient will be admitted to a medically monitored bed for community-acquired pneumonia and will be placed on IV Rocephin and Zithromax.  Mucolytic therapy be provided as well as duo nebs q.i.d. and q.4 hours p.r.n.Marland Kitchen  Sputum Gram stain culture and sensitivity will be obtained.  2.  End-stage renal disease on hemodialysis.  Nephrology consultation will be obtained by Dr. Holley Raring for follow-up on hemodialysis.  3.  Mild acute on chronic diastolic CHF.  He will be diuresed with IV Lasix awaiting hemodialysis.  4.  Seizure disorder.  Keppra will be continued.  5.  Coronary artery disease.  Aspirin and Plavix will be resumed as well as beta-blocker therapy with Toprol-XL and as needed sublingual nitroglycerin.   6.  Hypertension.  We will continue Toprol-XL while holding off Cozaar.  7.  Dementia.  We will continue Namenda.  8.  DVT prophylaxis.  Subtenons heparin.   All the records are reviewed and case discussed with ED provider. The plan of care was discussed in details with the patient (and family). I answered all questions. The patient agreed to proceed with the above mentioned plan. Further management will depend upon hospital course.   CODE STATUS: Full code  TOTAL TIME TAKING CARE OF THIS PATIENT: 55 minutes.    Christel Mormon M.D on 04/26/2019 at 6:40 AM  [facility]   From 7 PM-7 AM, contact night-coverage [email or web address]  CC: Primary care physician; Kirk Ruths, MD   Note: This dictation was prepared with Dragon dictation along with smaller phrase technology. Any transcriptional errors that result from this process are unintentional.

## 2019-04-26 NOTE — ED Notes (Signed)
Lab called to collect second set of blood cultures, as pt is a difficult stick.

## 2019-04-26 NOTE — ED Notes (Signed)
Speech therapy at bedside with patient

## 2019-04-26 NOTE — ED Notes (Signed)
Pt is difficult stick for IV, tried by 2 RNs. IV team consult placed.

## 2019-04-26 NOTE — ED Notes (Signed)
Patient laying in bed with eyes closed. Even, unlabored respiration. Patient making slight adjustments in position independently.

## 2019-04-27 LAB — CBC
HCT: 29.1 % — ABNORMAL LOW (ref 39.0–52.0)
HCT: 29.9 % — ABNORMAL LOW (ref 39.0–52.0)
Hemoglobin: 9.4 g/dL — ABNORMAL LOW (ref 13.0–17.0)
Hemoglobin: 9.6 g/dL — ABNORMAL LOW (ref 13.0–17.0)
MCH: 23.3 pg — ABNORMAL LOW (ref 26.0–34.0)
MCH: 23 pg — ABNORMAL LOW (ref 26.0–34.0)
MCHC: 32.3 g/dL (ref 30.0–36.0)
MCHC: 32.1 g/dL (ref 30.0–36.0)
MCV: 72 fL — ABNORMAL LOW (ref 80.0–100.0)
MCV: 71.7 fL — ABNORMAL LOW (ref 80.0–100.0)
Platelets: 158 10*3/uL (ref 150–400)
Platelets: 127 10*3/uL — ABNORMAL LOW (ref 150–400)
RBC: 4.04 MIL/uL — ABNORMAL LOW (ref 4.22–5.81)
RBC: 4.17 MIL/uL — ABNORMAL LOW (ref 4.22–5.81)
RDW: 15.6 % — ABNORMAL HIGH (ref 11.5–15.5)
RDW: 15.5 % (ref 11.5–15.5)
WBC: 9.5 10*3/uL (ref 4.0–10.5)
WBC: 9.5 10*3/uL (ref 4.0–10.5)
nRBC: 0 % (ref 0.0–0.2)
nRBC: 0 % (ref 0.0–0.2)

## 2019-04-27 LAB — RENAL FUNCTION PANEL
Albumin: 3.1 g/dL — ABNORMAL LOW (ref 3.5–5.0)
Anion gap: 13 (ref 5–15)
BUN: 63 mg/dL — ABNORMAL HIGH (ref 8–23)
CO2: 25 mmol/L (ref 22–32)
Calcium: 8.5 mg/dL — ABNORMAL LOW (ref 8.9–10.3)
Chloride: 101 mmol/L (ref 98–111)
Creatinine, Ser: 12.66 mg/dL — ABNORMAL HIGH (ref 0.61–1.24)
GFR calc Af Amer: 4 mL/min — ABNORMAL LOW (ref >60)
GFR calc non Af Amer: 4 mL/min — ABNORMAL LOW (ref >60)
Glucose, Bld: 101 mg/dL — ABNORMAL HIGH (ref 70–99)
Phosphorus: 3 mg/dL (ref 2.5–4.6)
Potassium: 4.6 mmol/L (ref 3.5–5.1)
Sodium: 139 mmol/L (ref 135–145)

## 2019-04-27 LAB — GLUCOSE, CAPILLARY
Glucose-Capillary: 102 mg/dL — ABNORMAL HIGH (ref 70–99)
Glucose-Capillary: 74 mg/dL (ref 70–99)
Glucose-Capillary: 93 mg/dL (ref 70–99)
Glucose-Capillary: 107 mg/dL — ABNORMAL HIGH (ref 70–99)

## 2019-04-27 MED ORDER — RENA-VITE PO TABS
1.0000 | ORAL_TABLET | Freq: Every day | ORAL | Status: DC
  Administered 2019-04-27 – 2019-05-01 (×4): 1 via ORAL
  Filled 2019-04-27 (×5): qty 1

## 2019-04-27 MED ORDER — NEPRO/CARBSTEADY PO LIQD
237.0000 mL | Freq: Two times a day (BID) | ORAL | Status: DC
  Administered 2019-04-27 – 2019-05-01 (×6): 237 mL via ORAL

## 2019-04-27 NOTE — Progress Notes (Signed)
Hemodialysis- Patient hypotensive. Not responding. UF off and given 200cc saline. HOB down.  Patient now responsive after bolus. Following commands. Is still somewhat lethargic. Continue to monitor closely. UF remains off. Goal decreased. BP 129/81. HOB remains down.

## 2019-04-27 NOTE — Progress Notes (Signed)
Initial Nutrition Assessment  DOCUMENTATION CODES:   Not applicable  INTERVENTION:   Nepro Shake po BID, each supplement provides 425 kcal and 19 grams protein  Rena-vite daily   NUTRITION DIAGNOSIS:   Increased nutrient needs related to chronic illness(ESRD on HD) as evidenced by increased estimated needs  GOAL:   Patient will meet greater than or equal to 90% of their needs  MONITOR:   PO intake, Supplement acceptance, Labs, Weight trends, Skin, I & O's  REASON FOR ASSESSMENT:   Malnutrition Screening Tool    ASSESSMENT:   68 y.o. male with a known history of end-stage renal disease on hemodialysis on Tuesday Thursday and Saturday, chronic diastolic CHF, coronary artery disease status post CABG, dyslipidemia, hypertension and type 2 diabetes mellitus admitted with CAP with sepsis   Unable to see patient today as pt in HD at time of RD visit. Pt seen by SLP who recommended Dysphagia 3/thin liquid diet. RD will add supplements to help pt meet his estimated needs and replace losses from HD. Per chart, pt appears fairly weight stable pta. RD will obtain nutrition related history and exam at follow-up.   Medications reviewed and include: aspirin, phoslo, plavix, heparin, insulin, remeron, azithromycin, ceftriaxone   Labs reviewed: BUN 63(H), creat 12.66(H), P 3.0 wnl Hgb 9.6(L), Hct 29.9(L), MCV 71.7(L), MCH 23.0(L)  Unable to complete Nutrition-Focused physical exam at this time.   Diet Order:   Diet Order            DIET DYS 3 Room service appropriate? Yes with Assist; Fluid consistency: Thin  Diet effective now             EDUCATION NEEDS:   Not appropriate for education at this time  Skin:  Skin Assessment: Reviewed RN Assessment  Last BM:  11/9  Height:   Ht Readings from Last 1 Encounters:  04/26/19 5\' 3"  (1.6 m)    Weight:   Wt Readings from Last 1 Encounters:  04/27/19 75 kg    Ideal Body Weight:  52 kg  BMI:  Body mass index is 29.29  kg/m.  Estimated Nutritional Needs:   Kcal:  1800-2100kcal/day  Protein:  90-105g/day  Fluid:  UOP +1L  Koleen Distance MS, RD, LDN Pager #- [phone] Office#- [phone] After Hours Pager: [phone]

## 2019-04-27 NOTE — Progress Notes (Addendum)
PROGRESS NOTE    [name]  [id number] DOB: [birth year] DOA: 04/25/2019 PCP: Kirk Ruths, MD      Brief Narrative:  [name] is a 68 y.o. M with ESRD, seizures, dCHF, CAD s/p CABG, dementia, HTN and DM who presented with fever, cough.  In the ER, CXR showed pneumonia.  Started on antibiotics.        Assessment & Plan:  Community-acquired pneumonia, right lower lobe Sepsis from pneumonia Patient presented with fever, tachypnea, leukocytosis and encephalopathy.   Afebrile ON.  Pulse and RR improved.  WBC normal -Continue ceftriaxone and azithromycin -SLP consult   Acute metabolic encephalopathy From pneumonia.  At baseline, patient is interactive and mostly oriented, currently he is still sleepy and confused.  No sedating medicines today.   -Treat underlying infection -PT/OT -If worsening, will need to obtain ABG  ESRD -Consult nephrology for maintenance HD, appreciate recommendations -Midodrine before HD  Coronary disease, secondary prevention Chronic diastolic CHF Hypertension BP improved -Continue aspirin, atorvastatin -Stop IV furosemide -Continue metoprolol  Diabetes Glucoses controlled -Continue sliding scale corrections  Dementia -Continue mirtazapine, memantine  History of seizures No seizure acitivty -Continue Keppra            DVT prophylaxis: Heparin Code Status: Full code Family Communication:       MDM and disposition Plan:  The below labs and imaging reports reviewed and summarized above.  Medication management as above.   The patient was admitted with pneumonia and encephaloapthy.   He remains sleepy today, although nursing report he is interactive with them and with family by phone.    Patient Temp < 100 F, heart rate < 100bpm, RR < 24, but mentation still poor.  Will need ongoing IV antibiotics, inpatient dialysis and PT evaluation before safe discharge.     Objective: Vitals:   04/27/19 1217  04/27/19 1225 04/27/19 1249 04/27/19 1322  BP: (!) 168/85 (!) 168/83 (!) 145/85 (!) 153/84  Pulse: 81 82 83 85  Resp: (!) 22 (!) 22  17  Temp: 97.6 F (36.4 C)  98.1 F (36.7 C) 98.6 F (37 C)  TempSrc: Oral     SpO2: 100% 95% 100% 99%  Weight: 75 kg     Height:        Intake/Output Summary (Last 24 hours) at 04/27/2019 1947 Last data filed at 04/27/2019 1643 Gross per 24 hour  Intake 546.46 ml  Output 1450 ml  Net -903.54 ml   Filed Weights   04/26/19 0007 04/27/19 0904 04/27/19 1217  Weight: 77.1 kg 76.3 kg 75 kg    Examination: General appearance:  adult male, sleeping, slow to rouse  HEENT: Anicteric, conjunctiva pink, lids and lashes normal. No nasal deformity, discharge, epistaxis.  Lips moist, teeth normal. OP dry, no oral lesions.   Skin: Warm and dry.  No suspicious rashes or lesions. Cardiac: RRR, no murmurs appreciated.  No JVD No LE edema.    Respiratory: Normal respiratory rate and rhythm, but shallow.  CTAB without rales or wheezes.  Diminished thoruhgout Abdomen: Abdomen soft.  No TTP or grimace, no guarding. No ascites, distension, hepatosplenomegaly.   MSK: No deformities or effusions of the large joints of the upper or lower extremities bilaterally. Neuro: Sleeping, very psychomotor slowed.  Muscle tone diminished.  Able to lift both arms 3/5 strength.  Psych: Understands some english.  Very few answers  Attention diminisehd, does not make eye contact or open eyes more than a little.     Data Reviewed:  I have personally reviewed following labs and imaging studies:  CBC: Recent Labs  Lab 04/26/19 0139 04/26/19 0411 04/27/19 0546 04/27/19 0825  WBC 11.6* 13.5* 9.5 9.5  NEUTROABS 9.5*  --   --   --   HGB 11.2* 11.3* 9.4* 9.6*  HCT 35.7* 35.7* 29.1* 29.9*  MCV 73.0* 73.0* 72.0* 71.7*  PLT 185 147* 158 [id number]   Basic Metabolic Panel: Recent Labs  Lab 04/26/19 0139 04/26/19 0411 04/27/19 0546  NA 138 138 139  K 4.1 4.5 4.6  CL 95* 97* 101  CO2  29 28 25   GLUCOSE 128* 149* 101*  BUN 49* 49* 63*  CREATININE 10.70* 10.96* 12.66*  CALCIUM 9.0 9.1 8.5*  PHOS  --   --  3.0   GFR: Estimated Creatinine Clearance: 5.1 mL/min (A) (by C-G formula based on SCr of 12.66 mg/dL (H)). Liver Function Tests: Recent Labs  Lab 04/26/19 0139 04/27/19 0546  AST 18  --   ALT 13  --   ALKPHOS 84  --   BILITOT 0.7  --   PROT 7.8  --   ALBUMIN 3.6 3.1*   No results for input(s): LIPASE, AMYLASE in the last 168 hours. No results for input(s): AMMONIA in the last 168 hours. Coagulation Profile: No results for input(s): INR, PROTIME in the last 168 hours. Cardiac Enzymes: No results for input(s): CKTOTAL, CKMB, CKMBINDEX, TROPONINI in the last 168 hours. BNP (last 3 results) No results for input(s): PROBNP in the last 8760 hours. HbA1C: Recent Labs    04/26/19 0411  HGBA1C 5.8*   CBG: Recent Labs  Lab 04/26/19 1737 04/26/19 2129 04/27/19 0732 04/27/19 1337 04/27/19 1623  GLUCAP 85 100* 102* 74 93   Lipid Profile: No results for input(s): CHOL, HDL, LDLCALC, TRIG, CHOLHDL, LDLDIRECT in the last 72 hours. Thyroid Function Tests: No results for input(s): TSH, T4TOTAL, FREET4, T3FREE, THYROIDAB in the last 72 hours. Anemia Panel: No results for input(s): VITAMINB12, FOLATE, FERRITIN, TIBC, IRON, RETICCTPCT in the last 72 hours. Urine analysis:    Component Value Date/Time   COLORURINE Straw 02/28/2014 1326   APPEARANCEUR Clear 02/28/2014 1326   LABSPEC 1.010 02/28/2014 1326   PHURINE 5.0 02/28/2014 1326   GLUCOSEU Negative 02/28/2014 1326   HGBUR 1+ 02/28/2014 1326   BILIRUBINUR Negative 02/28/2014 1326   KETONESUR Negative 02/28/2014 1326   PROTEINUR Negative 02/28/2014 1326   NITRITE Negative 02/28/2014 1326   LEUKOCYTESUR Negative 02/28/2014 1326   Sepsis Labs: @LABRCNTIP (procalcitonin:4,lacticacidven:4)  ) Recent Results (from the past 240 hour(s))  SARS CORONAVIRUS 2 (TAT 6-24 HRS) Nasopharyngeal Nasopharyngeal  Swab     Status: None   Collection Time: 04/26/19 12:26 AM   Specimen: Nasopharyngeal Swab  Result Value Ref Range Status   SARS Coronavirus 2 NEGATIVE NEGATIVE Final    Comment: (NOTE) SARS-CoV-2 target nucleic acids are NOT DETECTED. The SARS-CoV-2 RNA is generally detectable in upper and lower respiratory specimens during the acute phase of infection. Negative results do not preclude SARS-CoV-2 infection, do not rule out co-infections with other pathogens, and should not be used as the sole basis for treatment or other patient management decisions. Negative results must be combined with clinical observations, patient history, and epidemiological information. The expected result is Negative. Fact Sheet for Patients: SugarRoll.be Fact Sheet for Healthcare Providers: https://www.woods-mathews.com/ This test is not yet approved or cleared by the [place] FDA and  has been authorized for detection and/or diagnosis of SARS-CoV-2 by FDA under an Emergency Use Authorization (EUA).  This EUA will remain  in effect (meaning this test can be used) for the duration of the COVID-19 declaration under Section 56 4(b)(1) of the Act, 21 U.S.C. section 360bbb-3(b)(1), unless the authorization is terminated or revoked sooner. Performed at [facility], [place] [address], [place], [place] [address]   Blood culture (routine x 2)     Status: None (Preliminary result)   Collection Time: 04/26/19  1:39 AM   Specimen: BLOOD  Result Value Ref Range Status   Specimen Description BLOOD RIGHT HAND  Final   Special Requests   Final    BOTTLES DRAWN AEROBIC AND ANAEROBIC Blood Culture results may not be optimal due to an inadequate volume of blood received in culture bottles   Culture   Final    NO GROWTH 1 DAY Performed at [facility], [address], [place], [place] [address]    Report Status PENDING  Incomplete  Blood culture (routine x 2)      Status: None (Preliminary result)   Collection Time: 04/26/19  2:25 AM   Specimen: BLOOD  Result Value Ref Range Status   Specimen Description BLOOD RIGHT HAND  Final   Special Requests   Final    BOTTLES DRAWN AEROBIC AND ANAEROBIC Blood Culture adequate volume   Culture   Final    NO GROWTH 1 DAY Performed at [facility], [address] [place], [place] [address]    Report Status PENDING  Incomplete         Radiology Studies: Dg Chest Portable 1 View  Result Date: 04/26/2019 CLINICAL DATA:  Firmness of breath EXAM: PORTABLE CHEST 1 VIEW COMPARISON:  Nov 13, 2018 FINDINGS: There is mild cardiomegaly. Overlying median sternotomy wires. Prominence to the central pulmonary vasculature seen. No large airspace consolidation. There is blunting of the left costophrenic angle which could be due to a trace effusion. No acute osseous abnormality. Vascular stent seen within the left arm. IMPRESSION: Mild cardiomegaly and pulmonary vascular congestion. Probable trace left pleural effusion. Electronically Signed   By: Prudencio Pair M.D.   On: 04/26/2019 00:29        Scheduled Meds: . acetaminophen  1,000 mg Oral Once  . aspirin EC  81 mg Oral Daily  . atorvastatin  80 mg Oral q1800  . calcium acetate  1,334 mg Oral TID WC  . clopidogrel  75 mg Oral Daily  . feeding supplement (NEPRO CARB STEADY)  237 mL Oral BID BM  . heparin injection (subcutaneous)  5,000 Units Subcutaneous Q8H  . influenza vaccine adjuvanted  0.5 mL Intramuscular Tomorrow-1000  . insulin aspart  0-15 Units Subcutaneous TID WC  . levETIRAcetam  750 mg Oral Daily   And  . levETIRAcetam  250 mg Oral Q T,Th,Sat-1800  . memantine  5 mg Oral Daily  . metoprolol succinate  12.5 mg Oral Daily  . mirtazapine  15 mg Oral QHS  . multivitamin  1 tablet Oral QHS   Continuous Infusions: . azithromycin Stopped (04/27/19 1553)  . cefTRIAXone (ROCEPHIN)  IV Stopped (04/27/19 1357)     LOS: 1 day    Time  spent: 25 minutes    Edwin Dada, MD [facility] 04/27/2019, 7:47 PM     Pager [phone] --- please page though AMION:  [email or web address] Password [email or web address] If 7PM-7AM, please contact night-coverage

## 2019-04-27 NOTE — Progress Notes (Signed)
[facility]  ROUNDING NOTE   Subjective:   Seen and examined on hemodialysis treatment. Tolerating treatment well. UF of 2 liters.     HEMODIALYSIS FLOWSHEET:  Blood Flow Rate (mL/min): 300 mL/min Arterial Pressure (mmHg): -60 mmHg Venous Pressure (mmHg): 200 mmHg Transmembrane Pressure (mmHg): 50 mmHg Ultrafiltration Rate (mL/min): 140 mL/min Dialysate Flow Rate (mL/min): 600 ml/min Conductivity: Machine : 14 Conductivity: Machine : 14 Dialysis Fluid Bolus: Normal Saline Bolus Amount (mL): 100 mL Dialysate Change: 2K   Objective:  Vital signs in last 24 hours:  Temp:  [97.9 F (36.6 C)-99.4 F (37.4 C)] 97.9 F (36.6 C) (11/10 0904) Pulse Rate:  [63-89] 78 (11/10 1200) Resp:  [16-28] 22 (11/10 1200) BP: (72-179)/(51-121) 157/93 (11/10 1200) SpO2:  [93 %-100 %] 100 % (11/10 1200) Weight:  [76.3 kg] 76.3 kg (11/10 0904)  Weight change:  Filed Weights   04/26/19 0007 04/27/19 0904  Weight: 77.1 kg 76.3 kg    Intake/Output: I/O last 3 completed shifts: In: 266.7 [P.O.:60; IV Piggyback:206.7] Out: 0    Intake/Output this shift:  No intake/output data recorded.  Physical Exam: General: NAD, laying in bed  Head: Normocephalic, atraumatic. Moist oral mucosal membranes  Eyes: Anicteric, PERRL  Neck: Supple, trachea midline  Lungs:  Bilateral crackles at bases  Heart: Regular rate and rhythm  Abdomen:  Soft, nontender,   Extremities:  no peripheral edema.  Neurologic: Nonfocal, moving all four extremities  Skin: No lesions  Access: Left AVG    Basic Metabolic Panel: Recent Labs  Lab 04/26/19 0139 04/26/19 0411 04/27/19 0546  NA 138 138 139  K 4.1 4.5 4.6  CL 95* 97* 101  CO2 29 28 25   GLUCOSE 128* 149* 101*  BUN 49* 49* 63*  CREATININE 10.70* 10.96* 12.66*  CALCIUM 9.0 9.1 8.5*  PHOS  --   --  3.0    Liver Function Tests: Recent Labs  Lab 04/26/19 0139 04/27/19 0546  AST 18  --   ALT 13  --   ALKPHOS 84  --   BILITOT 0.7  --    PROT 7.8  --   ALBUMIN 3.6 3.1*   No results for input(s): LIPASE, AMYLASE in the last 168 hours. No results for input(s): AMMONIA in the last 168 hours.  CBC: Recent Labs  Lab 04/26/19 0139 04/26/19 0411 04/27/19 0546 04/27/19 0825  WBC 11.6* 13.5* 9.5 9.5  NEUTROABS 9.5*  --   --   --   HGB 11.2* 11.3* 9.4* 9.6*  HCT 35.7* 35.7* 29.1* 29.9*  MCV 73.0* 73.0* 72.0* 71.7*  PLT 185 147* 158 127*    Cardiac Enzymes: No results for input(s): CKTOTAL, CKMB, CKMBINDEX, TROPONINI in the last 168 hours.  BNP: Invalid input(s): POCBNP  CBG: Recent Labs  Lab 04/26/19 0949 04/26/19 1353 04/26/19 1737 04/26/19 2129 04/27/19 0732  GLUCAP 102* 96 85 100* 102*    Microbiology: Results for orders placed or performed during the hospital encounter of 04/25/19  SARS CORONAVIRUS 2 (TAT 6-24 HRS) Nasopharyngeal Nasopharyngeal Swab     Status: None   Collection Time: 04/26/19 12:26 AM   Specimen: Nasopharyngeal Swab  Result Value Ref Range Status   SARS Coronavirus 2 NEGATIVE NEGATIVE Final    Comment: (NOTE) SARS-CoV-2 target nucleic acids are NOT DETECTED. The SARS-CoV-2 RNA is generally detectable in upper and lower respiratory specimens during the acute phase of infection. Negative results do not preclude SARS-CoV-2 infection, do not rule out co-infections with other pathogens, and should not  be used as the sole basis for treatment or other patient management decisions. Negative results must be combined with clinical observations, patient history, and epidemiological information. The expected result is Negative. Fact Sheet for Patients: SugarRoll.be Fact Sheet for Healthcare Providers: https://www.woods-mathews.com/ This test is not yet approved or cleared by the [place] FDA and  has been authorized for detection and/or diagnosis of SARS-CoV-2 by FDA under an Emergency Use Authorization (EUA). This EUA will remain  in effect  (meaning this test can be used) for the duration of the COVID-19 declaration under Section 56 4(b)(1) of the Act, 21 U.S.C. section 360bbb-3(b)(1), unless the authorization is terminated or revoked sooner. Performed at [facility], [place] [address], [place], [place] [address]   Blood culture (routine x 2)     Status: None (Preliminary result)   Collection Time: 04/26/19  1:39 AM   Specimen: BLOOD  Result Value Ref Range Status   Specimen Description BLOOD RIGHT HAND  Final   Special Requests   Final    BOTTLES DRAWN AEROBIC AND ANAEROBIC Blood Culture results may not be optimal due to an inadequate volume of blood received in culture bottles   Culture   Final    NO GROWTH 1 DAY Performed at [facility], [address], [place], [place] [address]    Report Status PENDING  Incomplete  Blood culture (routine x 2)     Status: None (Preliminary result)   Collection Time: 04/26/19  2:25 AM   Specimen: BLOOD  Result Value Ref Range Status   Specimen Description BLOOD RIGHT HAND  Final   Special Requests   Final    BOTTLES DRAWN AEROBIC AND ANAEROBIC Blood Culture adequate volume   Culture   Final    NO GROWTH 1 DAY Performed at [facility], [address], [place], [place] [address]    Report Status PENDING  Incomplete    Coagulation Studies: No results for input(s): LABPROT, INR in the last 72 hours.  Urinalysis: No results for input(s): COLORURINE, LABSPEC, PHURINE, GLUCOSEU, HGBUR, BILIRUBINUR, KETONESUR, PROTEINUR, UROBILINOGEN, NITRITE, LEUKOCYTESUR in the last 72 hours.  Invalid input(s): APPERANCEUR    Imaging: Dg Chest Portable 1 View  Result Date: 04/26/2019 CLINICAL DATA:  Firmness of breath EXAM: PORTABLE CHEST 1 VIEW COMPARISON:  Nov 13, 2018 FINDINGS: There is mild cardiomegaly. Overlying median sternotomy wires. Prominence to the central pulmonary vasculature seen. No large airspace consolidation. There is blunting of the left  costophrenic angle which could be due to a trace effusion. No acute osseous abnormality. Vascular stent seen within the left arm. IMPRESSION: Mild cardiomegaly and pulmonary vascular congestion. Probable trace left pleural effusion. Electronically Signed   By: Prudencio Pair M.D.   On: 04/26/2019 00:29     Medications:   . azithromycin    . cefTRIAXone (ROCEPHIN)  IV Stopped (04/26/19 1604)   . acetaminophen  1,000 mg Oral Once  . aspirin EC  81 mg Oral Daily  . atorvastatin  80 mg Oral q1800  . calcium acetate  1,334 mg Oral TID WC  . clopidogrel  75 mg Oral Daily  . heparin injection (subcutaneous)  5,000 Units Subcutaneous Q8H  . influenza vaccine adjuvanted  0.5 mL Intramuscular Tomorrow-1000  . insulin aspart  0-15 Units Subcutaneous TID WC  . levETIRAcetam  750 mg Oral Daily   And  . levETIRAcetam  250 mg Oral Q T,Th,Sat-1800  . memantine  5 mg Oral Daily  . metoprolol succinate  12.5 mg Oral Daily  . mirtazapine  15 mg Oral QHS   acetaminophen **OR** acetaminophen, bisacodyl, ondansetron **OR** ondansetron (ZOFRAN) IV  Assessment/ Plan:  [name] is a 68 y.o. Asian American ([place]) male with end stage renal disease on hemodialysis, history of renal transplant, hypertension, diabetes mellitus type II, peptic ulcer disease, gout, diastolic congestive heart failure. Admitted to [facility] on 04/25/2019 for Community acquired pneumonia, unspecified laterality [J18.9] Sepsis without acute organ dysfunction, due to unspecified organism ([place]) [A41.9] Pneumonia [J18.9]  [facility] TTS 195 minutes 71.5kg Left AVG  1. End Stage Renal Disease: seen and examined on hemodialysis treatment.  Evaluate daily for dialysis need.   2. Hypertension: well controlled. Home regimen of losartan and metoprolol - midodrine before dialysis  3. Anemia of chronic kidney disease: hemoglobin 9.6. EPO as outpatient.   4. Secondary Hyperparathyroidism: outpatient labs at goal.  - calcium  acetate with meals.    LOS: 1 [name] 11/10/202012:12 PM

## 2019-04-27 NOTE — Progress Notes (Signed)
Hemodialysis- Patient arrived to unit. No distress noted. Opens eyes to voice. Denies pain. Lungs diminished. All labs and orders reviewed. HD initiated via L AVG without issue.

## 2019-04-28 LAB — CBC
HCT: 31.3 % — ABNORMAL LOW (ref 39.0–52.0)
Hemoglobin: 10 g/dL — ABNORMAL LOW (ref 13.0–17.0)
MCH: 23.1 pg — ABNORMAL LOW (ref 26.0–34.0)
MCHC: 31.9 g/dL (ref 30.0–36.0)
MCV: 72.5 fL — ABNORMAL LOW (ref 80.0–100.0)
Platelets: 141 10*3/uL — ABNORMAL LOW (ref 150–400)
RBC: 4.32 MIL/uL (ref 4.22–5.81)
RDW: 15.2 % (ref 11.5–15.5)
WBC: 7.3 10*3/uL (ref 4.0–10.5)
nRBC: 0 % (ref 0.0–0.2)

## 2019-04-28 LAB — BASIC METABOLIC PANEL
Anion gap: 13 (ref 5–15)
BUN: 32 mg/dL — ABNORMAL HIGH (ref 8–23)
CO2: 27 mmol/L (ref 22–32)
Calcium: 8.6 mg/dL — ABNORMAL LOW (ref 8.9–10.3)
Chloride: 99 mmol/L (ref 98–111)
Creatinine, Ser: 8.37 mg/dL — ABNORMAL HIGH (ref 0.61–1.24)
GFR calc Af Amer: 7 mL/min — ABNORMAL LOW (ref >60)
GFR calc non Af Amer: 6 mL/min — ABNORMAL LOW (ref >60)
Glucose, Bld: 89 mg/dL (ref 70–99)
Potassium: 4.4 mmol/L (ref 3.5–5.1)
Sodium: 139 mmol/L (ref 135–145)

## 2019-04-28 LAB — GLUCOSE, CAPILLARY
Glucose-Capillary: 85 mg/dL (ref 70–99)
Glucose-Capillary: 85 mg/dL (ref 70–99)
Glucose-Capillary: 123 mg/dL — ABNORMAL HIGH (ref 70–99)
Glucose-Capillary: 100 mg/dL — ABNORMAL HIGH (ref 70–99)

## 2019-04-28 MED ORDER — SODIUM CHLORIDE 0.9 % IV SOLN
INTRAVENOUS | Status: DC | PRN
  Administered 2019-04-28 (×2): 30 mL via INTRAVENOUS
  Administered 2019-04-30: 17:00:00 500 mL via INTRAVENOUS

## 2019-04-28 MED ORDER — CEFDINIR 300 MG PO CAPS
300.0000 mg | ORAL_CAPSULE | Freq: Every day | ORAL | Status: DC
  Administered 2019-04-29: 20:00:00 300 mg via ORAL
  Filled 2019-04-28 (×2): qty 1

## 2019-04-28 NOTE — Progress Notes (Signed)
[facility]  ROUNDING NOTE   Subjective:   Hemodialysis treatment yesterday.   Wife at bedside. Complains that patient has been sleeping and lethargic for several months now.   Objective:  Vital signs in last 24 hours:  Temp:  [97.3 F (36.3 C)-98.7 F (37.1 C)] 98.3 F (36.8 C) (11/11 1647) Pulse Rate:  [62-82] 70 (11/11 1647) Resp:  [14-24] 14 (11/11 1134) BP: (146-177)/(69-84) 166/69 (11/11 1647) SpO2:  [97 %-100 %] 99 % (11/11 1647)  Weight change:  Filed Weights   04/26/19 0007 04/27/19 0904 04/27/19 1217  Weight: 77.1 kg 76.3 kg 75 kg    Intake/Output: I/O last 3 completed shifts: In: 546.5 [P.O.:60; IV Piggyback:486.5] Out: 1450 [Other:1450]   Intake/Output this shift:  Total I/O In: 336.8 [I.V.:86.8; IV Piggyback:250] Out: -   Physical Exam: General: NAD, laying in bed  Head: Normocephalic, atraumatic. Moist oral mucosal membranes  Eyes: Anicteric, PERRL  Neck: Supple, trachea midline  Lungs:  Diminished bilaterally  Heart: Regular rate and rhythm  Abdomen:  Soft, nontender,   Extremities:  no peripheral edema.  Neurologic: Nonfocal, moving all four extremities  Skin: No lesions  Access: Left AVG    Basic Metabolic Panel: Recent Labs  Lab 04/26/19 0139 04/26/19 0411 04/27/19 0546 04/28/19 0503  NA 138 138 139 139  K 4.1 4.5 4.6 4.4  CL 95* 97* 101 99  CO2 29 28 25 27   GLUCOSE 128* 149* 101* 89  BUN 49* 49* 63* 32*  CREATININE 10.70* 10.96* 12.66* 8.37*  CALCIUM 9.0 9.1 8.5* 8.6*  PHOS  --   --  3.0  --     Liver Function Tests: Recent Labs  Lab 04/26/19 0139 04/27/19 0546  AST 18  --   ALT 13  --   ALKPHOS 84  --   BILITOT 0.7  --   PROT 7.8  --   ALBUMIN 3.6 3.1*   No results for input(s): LIPASE, AMYLASE in the last 168 hours. No results for input(s): AMMONIA in the last 168 hours.  CBC: Recent Labs  Lab 04/26/19 0139 04/26/19 0411 04/27/19 0546 04/27/19 0825 04/28/19 0503  WBC 11.6* 13.5* 9.5 9.5 7.3   NEUTROABS 9.5*  --   --   --   --   HGB 11.2* 11.3* 9.4* 9.6* 10.0*  HCT 35.7* 35.7* 29.1* 29.9* 31.3*  MCV 73.0* 73.0* 72.0* 71.7* 72.5*  PLT 185 147* 158 127* 141*    Cardiac Enzymes: No results for input(s): CKTOTAL, CKMB, CKMBINDEX, TROPONINI in the last 168 hours.  BNP: Invalid input(s): POCBNP  CBG: Recent Labs  Lab 04/27/19 1337 04/27/19 1623 04/27/19 2145 04/28/19 0749 04/28/19 1151  GLUCAP 74 93 107* 85 85    Microbiology: Results for orders placed or performed during the hospital encounter of 04/25/19  SARS CORONAVIRUS 2 (TAT 6-24 HRS) Nasopharyngeal Nasopharyngeal Swab     Status: None   Collection Time: 04/26/19 12:26 AM   Specimen: Nasopharyngeal Swab  Result Value Ref Range Status   SARS Coronavirus 2 NEGATIVE NEGATIVE Final    Comment: (NOTE) SARS-CoV-2 target nucleic acids are NOT DETECTED. The SARS-CoV-2 RNA is generally detectable in upper and lower respiratory specimens during the acute phase of infection. Negative results do not preclude SARS-CoV-2 infection, do not rule out co-infections with other pathogens, and should not be used as the sole basis for treatment or other patient management decisions. Negative results must be combined with clinical observations, patient history, and epidemiological information. The expected result is  Negative. Fact Sheet for Patients: SugarRoll.be Fact Sheet for Healthcare Providers: https://www.woods-mathews.com/ This test is not yet approved or cleared by the [place] FDA and  has been authorized for detection and/or diagnosis of SARS-CoV-2 by FDA under an Emergency Use Authorization (EUA). This EUA will remain  in effect (meaning this test can be used) for the duration of the COVID-19 declaration under Section 56 4(b)(1) of the Act, 21 U.S.C. section 360bbb-3(b)(1), unless the authorization is terminated or revoked sooner. Performed at [facility],  [place] [address] [place], [place] [address]   Blood culture (routine x 2)     Status: None (Preliminary result)   Collection Time: 04/26/19  1:39 AM   Specimen: BLOOD  Result Value Ref Range Status   Specimen Description BLOOD RIGHT HAND  Final   Special Requests   Final    BOTTLES DRAWN AEROBIC AND ANAEROBIC Blood Culture results may not be optimal due to an inadequate volume of blood received in culture bottles   Culture   Final    NO GROWTH 2 DAYS Performed at [facility], [address] [place], [place] [address]    Report Status PENDING  Incomplete  Blood culture (routine x 2)     Status: None (Preliminary result)   Collection Time: 04/26/19  2:25 AM   Specimen: BLOOD  Result Value Ref Range Status   Specimen Description BLOOD RIGHT HAND  Final   Special Requests   Final    BOTTLES DRAWN AEROBIC AND ANAEROBIC Blood Culture adequate volume   Culture   Final    NO GROWTH 2 DAYS Performed at [facility], [address], [place], [place] [address]    Report Status PENDING  Incomplete    Coagulation Studies: No results for input(s): LABPROT, INR in the last 72 hours.  Urinalysis: No results for input(s): COLORURINE, LABSPEC, PHURINE, GLUCOSEU, HGBUR, BILIRUBINUR, KETONESUR, PROTEINUR, UROBILINOGEN, NITRITE, LEUKOCYTESUR in the last 72 hours.  Invalid input(s): APPERANCEUR    Imaging: No results found.   Medications:   . sodium chloride 20 mL/hr at 04/28/19 1600   . acetaminophen  1,000 mg Oral Once  . aspirin EC  81 mg Oral Daily  . atorvastatin  80 mg Oral q1800  . calcium acetate  1,334 mg Oral TID WC  . [START ON 04/29/2019] cefdinir  300 mg Oral Daily  . clopidogrel  75 mg Oral Daily  . feeding supplement (NEPRO CARB STEADY)  237 mL Oral BID BM  . heparin injection (subcutaneous)  5,000 Units Subcutaneous Q8H  . influenza vaccine adjuvanted  0.5 mL Intramuscular Tomorrow-1000  . insulin aspart  0-15 Units Subcutaneous TID WC  .  levETIRAcetam  750 mg Oral Daily   And  . levETIRAcetam  250 mg Oral Q T,Th,Sat-1800  . memantine  5 mg Oral Daily  . metoprolol succinate  12.5 mg Oral Daily  . mirtazapine  15 mg Oral QHS  . multivitamin  1 tablet Oral QHS   sodium chloride, acetaminophen **OR** acetaminophen, bisacodyl, ondansetron **OR** ondansetron (ZOFRAN) IV  Assessment/ Plan:  [name] is a 68 y.o. Asian American ([place]) male with end stage renal disease on hemodialysis, history of renal transplant, hypertension, diabetes mellitus type II, peptic ulcer disease, gout, diastolic congestive heart failure. Admitted to [facility] on 04/25/2019 for Community acquired pneumonia, unspecified laterality [J18.9] Sepsis without acute organ dysfunction, due to unspecified organism ([place]) [A41.9] Pneumonia [J18.9]  [facility] TTS 195 minutes 71.5kg Left AVG  1. End Stage Renal Disease:  TTS schedule  2. Hypertension: well controlled. Home regimen of losartan and metoprolol - midodrine before dialysis  3. Anemia of chronic kidney disease: EPO with HD treatment.   4. Secondary Hyperparathyroidism: outpatient labs at goal.  - calcium acetate with meals.  5. Lethargy:  Concern for overmedication with anti-epileptics - Neurology consultation.    LOS: 2 [name] 11/11/20205:07 PM

## 2019-04-28 NOTE — Progress Notes (Signed)
PROGRESS NOTE    [name]  [id number] DOB: [birth year] DOA: 04/25/2019 PCP: Kirk Ruths, MD   Brief Narrative:  [name] is a 68 y.o. M with ESRD, seizures, dCHF, CAD s/p CABG, dementia, HTN and DM who presented with fever, cough.  In the ER, CXR showed pneumonia.  Started on antibiotics   Assessment & Plan:   Active Problems:   CAP (community acquired pneumonia)   Pneumonia   Community-acquired pneumonia, right lower lobe Sepsis from pneumonia Patient presented with fever, tachypnea, leukocytosis and encephalopathy.   Afebrile ON.  Pulse and RR improved.  WBC normal -Seems to p.o. antibiotics today -SLP consult   Acute metabolic encephalopathy From pneumonia.  At baseline, patient is interactive and mostly oriented, currently he is still sleepy and confused.  No sedating medicines today. -Staff discussed with family reported general he is lethargic in the morning awake in the evening.  Asked neurology to evaluate antiepileptics for sedating effect and adjusting  -Continue to treat underlying infection -PT pending, patient did not participate today  ESRD -Consulted nephrology for maintenance HD, appreciate recommendations -Midodrine before HD  Coronary disease, secondary prevention Chronic diastolic CHF Hypertension BP improved -Continue aspirin, atorvastatin -Stop IV furosemide -Continue metoprolol  Diabetes Glucoses controlled -Continue sliding scale corrections  Dementia -Continue mirtazapine, memantine  History of seizures No seizure acitivty -Requested neurology consultation for adjustment of antiepileptics -Current dosing concerning for sedating effect especially in the setting of end-stage renal disease on dialysis  DVT prophylaxis: Heparin SQ  Code Status: Full code    Code Status Orders  (From admission, onward)         Start     Ordered   04/26/19 0325  Full code  Continuous     04/26/19 0324        Code  Status History    Date Active Date Inactive Code Status Order ID Comments User Context   11/13/2018 1742 11/15/2018 1602 Full Code [id number]  Sela Hua, MD Inpatient   02/19/2018 1202 02/27/2018 1941 DNR [id number]  Gorden Harms, MD Inpatient   09/16/2016 0325 09/18/2016 1709 Full Code [id number]  [place], [place], DO Inpatient   09/04/2016 2336 09/14/2016 1909 Full Code [id number]  Lance Coon, MD Inpatient   07/24/2016 0219 07/29/2016 2015 Full Code [id number]  Hugelmeyer, Alexis, DO Inpatient   02/26/2016 0603 02/27/2016 1705 Full Code [id number]  Harrie Foreman, MD Inpatient   02/26/2016 0540 02/26/2016 0603 DNR [id number]  Harrie Foreman, MD Inpatient   02/14/2015 0816 02/17/2015 1347 DNR [id number]  Epifanio Lesches, MD ED   12/12/2014 2044 12/16/2014 1926 DNR [id number]  Loletha Grayer, MD ED   11/05/2014 2209 11/09/2014 2148 Full Code [id number]  Henreitta Leber, MD Inpatient   09/30/2014 1348 10/02/2014 1902 Full Code [id number]  [name], MD Inpatient   09/29/2014 2036 09/30/2014 1348 Full Code [id number]  [name], MD Inpatient   Advance Care Planning Activity    Advance Directive Documentation     Most Recent Value  Type of Advance Directive  Healthcare Power of Attorney  Pre-existing out of facility DNR order (yellow form or pink MOST form)  -  "MOST" Form in Place?  -     Family Communication: none today  Disposition Plan:   Remain inpatient additional day, awaiting PT evaluation.  Patient unsteady and unsafe for discharge at this point. Consults called: Neurology Admission status: Inpatient   Consultants:   neurology  Procedures:  Dg Chest Portable 1 View  Result  Date: 04/26/2019 CLINICAL DATA:  Firmness of breath EXAM: PORTABLE CHEST 1 VIEW COMPARISON:  Nov 13, 2018 FINDINGS: There is mild cardiomegaly. Overlying median sternotomy wires. Prominence to the central pulmonary vasculature seen. No large airspace consolidation. There is blunting of the left  costophrenic angle which could be due to a trace effusion. No acute osseous abnormality. Vascular stent seen within the left arm. IMPRESSION: Mild cardiomegaly and pulmonary vascular congestion. Probable trace left pleural effusion. Electronically Signed   By: Prudencio Pair M.D.   On: 04/26/2019 00:29     Antimicrobials:   swtiched from azithro/ceftriaxone to omnicef today    Subjective: Minimally interactive while this appears to be patient's baseline per family  Objective: Vitals:   04/28/19 0639 04/28/19 0955 04/28/19 1134 04/28/19 1152  BP: (!) 177/79 (!) 172/84 (!) 174/80 (!) 165/82  Pulse: 76 82 71 73  Resp: 16 (!) 24 14   Temp: 98.3 F (36.8 C) 98.7 F (37.1 C) (!) 97.3 F (36.3 C) 97.7 F (36.5 C)  TempSrc: Oral Oral Oral   SpO2: 97% 100% 100% 97%  Weight:      Height:        Intake/Output Summary (Last 24 hours) at 04/28/2019 1534 Last data filed at 04/28/2019 0639 Gross per 24 hour  Intake 386.46 ml  Output -  Net 386.46 ml   Filed Weights   04/26/19 0007 04/27/19 0904 04/27/19 1217  Weight: 77.1 kg 76.3 kg 75 kg    Examination:  General exam: Appears calm and comfortable  Respiratory system: Clear to auscultation. Respiratory effort normal. Cardiovascular system: S1 & S2 heard, RRR. No JVD, murmurs, rubs, gallops or clicks. No pedal edema. Gastrointestinal system: Abdomen is nondistended, soft and nontender. No organomegaly or masses felt. Normal bowel sounds heard. Central nervous system: Alert . No focal neurological deficits. Extremities: Does move all 4 extremities freely, warm well perfused, neurovascularly intact Skin: No rashes, lesions or ulcers Psychiatry: Judgement and insight appear normal. Mood & affect flat.     Data Reviewed: I have personally reviewed following labs and imaging studies  CBC: Recent Labs  Lab 04/26/19 0139 04/26/19 0411 04/27/19 0546 04/27/19 0825 04/28/19 0503  WBC 11.6* 13.5* 9.5 9.5 7.3  NEUTROABS 9.5*  --    --   --   --   HGB 11.2* 11.3* 9.4* 9.6* 10.0*  HCT 35.7* 35.7* 29.1* 29.9* 31.3*  MCV 73.0* 73.0* 72.0* 71.7* 72.5*  PLT 185 147* 158 127* [id number]*   Basic Metabolic Panel: Recent Labs  Lab 04/26/19 0139 04/26/19 0411 04/27/19 0546 04/28/19 0503  NA 138 138 139 139  K 4.1 4.5 4.6 4.4  CL 95* 97* 101 99  CO2 29 28 25 27   GLUCOSE 128* 149* 101* 89  BUN 49* 49* 63* 32*  CREATININE 10.70* 10.96* 12.66* 8.37*  CALCIUM 9.0 9.1 8.5* 8.6*  PHOS  --   --  3.0  --    GFR: Estimated Creatinine Clearance: 7.8 mL/min (A) (by C-G formula based on SCr of 8.37 mg/dL (H)). Liver Function Tests: Recent Labs  Lab 04/26/19 0139 04/27/19 0546  AST 18  --   ALT 13  --   ALKPHOS 84  --   BILITOT 0.7  --   PROT 7.8  --   ALBUMIN 3.6 3.1*   No results for input(s): LIPASE, AMYLASE in the last 168 hours. No results for input(s): AMMONIA in the last 168 hours. Coagulation Profile: No results for input(s): INR, PROTIME in the last 168  hours. Cardiac Enzymes: No results for input(s): CKTOTAL, CKMB, CKMBINDEX, TROPONINI in the last 168 hours. BNP (last 3 results) No results for input(s): PROBNP in the last 8760 hours. HbA1C: Recent Labs    04/26/19 0411  HGBA1C 5.8*   CBG: Recent Labs  Lab 04/27/19 1337 04/27/19 1623 04/27/19 2145 04/28/19 0749 04/28/19 1151  GLUCAP 74 93 107* 85 85   Lipid Profile: No results for input(s): CHOL, HDL, LDLCALC, TRIG, CHOLHDL, LDLDIRECT in the last 72 hours. Thyroid Function Tests: No results for input(s): TSH, T4TOTAL, FREET4, T3FREE, THYROIDAB in the last 72 hours. Anemia Panel: No results for input(s): VITAMINB12, FOLATE, FERRITIN, TIBC, IRON, RETICCTPCT in the last 72 hours. Sepsis Labs: Recent Labs  Lab 04/26/19 0139  PROCALCITON 0.21  LATICACIDVEN 1.5    Recent Results (from the past 240 hour(s))  SARS CORONAVIRUS 2 (TAT 6-24 HRS) Nasopharyngeal Nasopharyngeal Swab     Status: None   Collection Time: 04/26/19 12:26 AM   Specimen:  Nasopharyngeal Swab  Result Value Ref Range Status   SARS Coronavirus 2 NEGATIVE NEGATIVE Final    Comment: (NOTE) SARS-CoV-2 target nucleic acids are NOT DETECTED. The SARS-CoV-2 RNA is generally detectable in upper and lower respiratory specimens during the acute phase of infection. Negative results do not preclude SARS-CoV-2 infection, do not rule out co-infections with other pathogens, and should not be used as the sole basis for treatment or other patient management decisions. Negative results must be combined with clinical observations, patient history, and epidemiological information. The expected result is Negative. Fact Sheet for Patients: SugarRoll.be Fact Sheet for Healthcare Providers: https://www.woods-mathews.com/ This test is not yet approved or cleared by the [place] FDA and  has been authorized for detection and/or diagnosis of SARS-CoV-2 by FDA under an Emergency Use Authorization (EUA). This EUA will remain  in effect (meaning this test can be used) for the duration of the COVID-19 declaration under Section 56 4(b)(1) of the Act, 21 U.S.C. section 360bbb-3(b)(1), unless the authorization is terminated or revoked sooner. Performed at [facility], [place] [address] [place], [place] [address]   Blood culture (routine x 2)     Status: None (Preliminary result)   Collection Time: 04/26/19  1:39 AM   Specimen: BLOOD  Result Value Ref Range Status   Specimen Description BLOOD RIGHT HAND  Final   Special Requests   Final    BOTTLES DRAWN AEROBIC AND ANAEROBIC Blood Culture results may not be optimal due to an inadequate volume of blood received in culture bottles   Culture   Final    NO GROWTH 2 DAYS Performed at [facility], [address] [place], [place] [address]    Report Status PENDING  Incomplete  Blood culture (routine x 2)     Status: None (Preliminary result)   Collection Time: 04/26/19  2:25  AM   Specimen: BLOOD  Result Value Ref Range Status   Specimen Description BLOOD RIGHT HAND  Final   Special Requests   Final    BOTTLES DRAWN AEROBIC AND ANAEROBIC Blood Culture adequate volume   Culture   Final    NO GROWTH 2 DAYS Performed at [facility], [address] [place], [place] [address]    Report Status PENDING  Incomplete         Radiology Studies: No results found.      Scheduled Meds: . acetaminophen  1,000 mg Oral Once  . aspirin EC  81 mg Oral Daily  . atorvastatin  80 mg Oral q1800  . calcium acetate  1,334 mg Oral TID WC  . [START ON 04/29/2019] cefdinir  300 mg Oral Daily  . clopidogrel  75 mg Oral Daily  . feeding supplement (NEPRO CARB STEADY)  237 mL Oral BID BM  . heparin injection (subcutaneous)  5,000 Units Subcutaneous Q8H  . influenza vaccine adjuvanted  0.5 mL Intramuscular Tomorrow-1000  . insulin aspart  0-15 Units Subcutaneous TID WC  . levETIRAcetam  750 mg Oral Daily   And  . levETIRAcetam  250 mg Oral Q T,Th,Sat-1800  . memantine  5 mg Oral Daily  . metoprolol succinate  12.5 mg Oral Daily  . mirtazapine  15 mg Oral QHS  . multivitamin  1 tablet Oral QHS   Continuous Infusions: . sodium chloride 30 mL (04/28/19 1402)     LOS: 2 days    Time spent: 35 min    Nicolette Bang, MD [facility]  If 7PM-7AM, please contact night-coverage  04/28/2019, 3:34 PM

## 2019-04-28 NOTE — Progress Notes (Addendum)
PT Cancellation Note  Patient Details Name: [name] MRN: [id number] DOB: 1951-01-01   Cancelled Treatment:    Reason Eval/Treat Not Completed: Other (comment). Consult received and chart reviewed. Pt very lethargic and only briefly opens eyes. He does squeeze hands on commands, however no further movement noted. Unable to lift B LEs. Not able to participate in PT evaluation this date. Will re-attempt next available date.  1354: Evaluated attempted second time, pt remains lethargic. No family in room. Doesn't awaken to verbal cues. Will re-attempt next date if able to participate. Per CM, does become more alert in PM.   Amorina Doerr 04/28/2019, 10:23 AM Greggory Stallion, PT, DPT [phone]

## 2019-04-28 NOTE — TOC Initial Note (Signed)
Transition of Care [facility]) - Initial/Assessment Note    Patient Details  Name: [name] MRN: [id number] Date of Birth: 14-May-1951  [facility] CM/SW Contact:    Beverly Sessions, RN Phone Number: 04/28/2019, 4:22 PM  Clinical Narrative:                 Patient admitted from home with CAP. Patient lethargic.  Wife at bedside.  Completed assessment with wife and son via phone.   Patient lives at home with wife, son, daughter, and grandchildren  Wife and son transport patient to and from HD  PCP [facility]   Patient has RW, cane, and hospital bed in the home.  Son states at baseline patient is able to stand and pivot.    PT eval pending Family would like to take patient home with home health services through [facility].  PT and MD notified  Corene Cornea with [place] given referral for home health  Expected Discharge Plan: [place] Barriers to Discharge: Continued Medical Work up   Patient Goals and CMS Choice   CMS Medicare.gov Compare Post Acute Care list provided to:: Patient Choice offered to / list presented to : Adult Children, Spouse  Expected Discharge Plan and Services Expected Discharge Plan: [place]   Discharge Planning Services: CM Consult   Living arrangements for the past 2 months: Single Family Home                           HH Arranged: RN, PT, Nurse's Aide [place] Agency: [place] ([facility]) Date [place]: 04/28/19   Representative spoke with at [place]: Corene Cornea  Prior Living Arrangements/Services Living arrangements for the past 2 months: [place] Lives with:: Adult Children, Spouse Patient language and need for interpreter reviewed:: Yes Do you feel safe going back to the place where you live?: Yes      Need for Family Participation in Patient Care: Yes (Comment) Care giver support system in place?: Yes  (comment) Current home services: DME Criminal Activity/Legal Involvement Pertinent to Current Situation/Hospitalization: No - Comment as needed  Activities of Daily Living Home Assistive Devices/Equipment: Environmental consultant (specify type), Wheelchair ADL Screening (condition at time of admission) Patient's cognitive ability adequate to safely complete daily activities?: No Is the patient deaf or have difficulty hearing?: No Does the patient have difficulty seeing, even when wearing glasses/contacts?: Yes Does the patient have difficulty concentrating, remembering, or making decisions?: No Patient able to express need for assistance with ADLs?: Yes Does the patient have difficulty dressing or bathing?: Yes Independently performs ADLs?: No Communication: Needs assistance Is this a change from baseline?: Pre-admission baseline Dressing (OT): Needs assistance Is this a change from baseline?: Pre-admission baseline Grooming: Needs assistance Is this a change from baseline?: Pre-admission baseline Feeding: Needs assistance Is this a change from baseline?: Pre-admission baseline Bathing: Needs assistance Is this a change from baseline?: Pre-admission baseline Toileting: Needs assistance Is this a change from baseline?: Pre-admission baseline In/Out Bed: Needs assistance Is this a change from baseline?: Pre-admission baseline Walks in Home: Needs assistance Is this a change from baseline?: Pre-admission baseline Does the patient have difficulty walking or climbing stairs?: Yes Weakness of Legs: Both Weakness of Arms/Hands: Both  Permission Sought/Granted                  Emotional Assessment  Orientation: : Oriented to Self   Psych Involvement: No (comment)  Admission diagnosis:  Community acquired pneumonia, unspecified laterality [J18.9] Sepsis without acute organ dysfunction, due to unspecified organism [facility]) [A41.9] Pneumonia [J18.9] Patient Active Problem List   Diagnosis  Date Noted  . Pneumonia 04/26/2019  . Community acquired pneumonia 11/13/2018  . CVA (cerebral vascular accident) ([place])   . Palliative care by specialist   . Acute encephalopathy 02/19/2018  . Calculus of common duct   . Fitting and adjustment of gastrointestinal appliance and device   . Elevated LFTs 09/16/2016  . Calculus of bile duct without cholecystitis with obstruction   . Choledocholithiasis   . Diabetes ([place]) 09/04/2016  . HTN (hypertension) 09/04/2016  . GERD (gastroesophageal reflux disease) 09/04/2016  . Chronic diastolic CHF (congestive heart failure) ([place]) 09/04/2016  . Transaminitis 09/04/2016  . Sepsis ([place]) 09/04/2016  . CAP (community acquired pneumonia) 09/04/2016  . Encephalopathy acute   . HCAP (healthcare-associated pneumonia) 07/24/2016  . Bradycardia 06/25/2016  . Alteration in self-care ability 03/05/2016  . Hx of subdural hematoma 03/05/2016  . 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 NSTEMI (non-ST elevated myocardial infarction) ([place]) 02/26/2016  . H/O non-ST elevation myocardial infarction (NSTEMI) 01/10/2016  . S/P coronary artery stent placement 01/10/2016  . Depression, major, single episode, complete remission ([place]) 07/22/2015  . Acute respiratory failure with hypoxia ([place]) 02/14/2015  . Syncope 11/05/2014  . Acute cystitis with hematuria 10/11/2014  . Acute subdural hematoma ([place]) 10/10/2014  . Anemia in chronic kidney disease (CKD) 10/07/2014  . ESRD on hemodialysis ([place]) 10/07/2014  . Mixed hyperlipidemia 10/07/2014  . 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 Contaminant given to patient   . Shortness of breath   . Viridans streptococci infection   . Polymicrobial bacterial infection   . Bacteremia   . 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 ESRD on dialysis ([place])   . Type 1 diabetes mellitus with other diabetic kidney complication ([place])   . Acute coronary syndrome ([place]) 09/29/2014  . Anemia associated with chronic renal  failure 03/25/2014  . Nausea & vomiting 03/25/2014  . Gout 03/17/2012  . H/O: upper GI bleed 03/17/2012  . Hyperlipidemia, unspecified 03/17/2012  . Kidney transplant status, cadaveric 07/16/2002   PCP:  Kirk Ruths, MD Pharmacy:   [facility] [place], [place] - [address] [place] [address] Phone: [phone] Fax: [phone]  [facility] (ESRD Bundle Only) - [place], [place] [address] [place] [address] Phone: [phone] Fax: [place], [place] [facility] [address] [place] [address] [place] [address] Phone: [phone] Fax: [place] [id number] [place], [place] - [place] [address] Phone: [phone] Fax: [phone]     Social Determinants of Health (SDOH) Interventions    Readmission Risk Interventions Readmission Risk Prevention Plan 04/28/2019 02/20/2018  Transportation Screening Complete -  Home Care Screening - Not Complete  HRI or Home Care Consult Complete -  Medication Review (RN Care Manager) Complete -  Some recent data might be hidden

## 2019-04-29 LAB — RENAL FUNCTION PANEL
Albumin: 2.9 g/dL — ABNORMAL LOW (ref 3.5–5.0)
Anion gap: 13 (ref 5–15)
BUN: 46 mg/dL — ABNORMAL HIGH (ref 8–23)
CO2: 25 mmol/L (ref 22–32)
Calcium: 8.2 mg/dL — ABNORMAL LOW (ref 8.9–10.3)
Chloride: 100 mmol/L (ref 98–111)
Creatinine, Ser: 11.14 mg/dL — ABNORMAL HIGH (ref 0.61–1.24)
GFR calc Af Amer: 5 mL/min — ABNORMAL LOW (ref >60)
GFR calc non Af Amer: 4 mL/min — ABNORMAL LOW (ref >60)
Glucose, Bld: 94 mg/dL (ref 70–99)
Phosphorus: 3.7 mg/dL (ref 2.5–4.6)
Potassium: 4.2 mmol/L (ref 3.5–5.1)
Sodium: 138 mmol/L (ref 135–145)

## 2019-04-29 LAB — GLUCOSE, CAPILLARY
Glucose-Capillary: 80 mg/dL (ref 70–99)
Glucose-Capillary: 97 mg/dL (ref 70–99)
Glucose-Capillary: 85 mg/dL (ref 70–99)
Glucose-Capillary: 176 mg/dL — ABNORMAL HIGH (ref 70–99)
Glucose-Capillary: 178 mg/dL — ABNORMAL HIGH (ref 70–99)

## 2019-04-29 LAB — CBC
HCT: 30.1 % — ABNORMAL LOW (ref 39.0–52.0)
Hemoglobin: 9.3 g/dL — ABNORMAL LOW (ref 13.0–17.0)
MCH: 23 pg — ABNORMAL LOW (ref 26.0–34.0)
MCHC: 30.9 g/dL (ref 30.0–36.0)
MCV: 74.5 fL — ABNORMAL LOW (ref 80.0–100.0)
Platelets: 170 10*3/uL (ref 150–400)
RBC: 4.04 MIL/uL — ABNORMAL LOW (ref 4.22–5.81)
RDW: 15.1 % (ref 11.5–15.5)
WBC: 6.1 10*3/uL (ref 4.0–10.5)
nRBC: 0 % (ref 0.0–0.2)

## 2019-04-29 MED ORDER — LEVETIRACETAM 250 MG PO TABS
250.0000 mg | ORAL_TABLET | ORAL | Status: DC
  Administered 2019-04-29: 16:00:00 250 mg via ORAL
  Filled 2019-04-29: qty 1

## 2019-04-29 MED ORDER — LEVETIRACETAM 500 MG PO TABS
500.0000 mg | ORAL_TABLET | Freq: Every day | ORAL | Status: DC
  Filled 2019-04-29 (×2): qty 1

## 2019-04-29 MED ORDER — EPOETIN ALFA 10000 UNIT/ML IJ SOLN
10000.0000 [IU] | INTRAMUSCULAR | Status: DC
  Administered 2019-04-29 – 2019-05-01 (×2): 10000 [IU] via INTRAVENOUS

## 2019-04-29 NOTE — Progress Notes (Signed)
[facility]  ROUNDING NOTE   Subjective:   Lethargic   Seen and examined on hemodialysis treatment. Tolerating treatment well. UF goal of 2 liters.  Objective:  Vital signs in last 24 hours:  Temp:  [97.3 F (36.3 C)-98.5 F (36.9 C)] 98.5 F (36.9 C) (11/12 1030) Pulse Rate:  [68-73] 68 (11/12 1045) Resp:  [14-25] 24 (11/12 1045) BP: (149-190)/(63-86) 149/72 (11/12 1045) SpO2:  [95 %-100 %] 99 % (11/12 0505)  Weight change:  Filed Weights   04/26/19 0007 04/27/19 0904 04/27/19 1217  Weight: 77.1 kg 76.3 kg 75 kg    Intake/Output: I/O last 3 completed shifts: In: 336.8 [I.V.:86.8; IV Piggyback:250] Out: -    Intake/Output this shift:  No intake/output data recorded.  Physical Exam: General: NAD, laying in bed  Head: Normocephalic, atraumatic. Moist oral mucosal membranes  Eyes: Anicteric, PERRL  Neck: Supple, trachea midline  Lungs:  Diminished bilaterally  Heart: Regular rate and rhythm  Abdomen:  Soft, nontender,   Extremities:  + peripheral edema.  Neurologic: Lethargic, appropriately answers questions  Skin: No lesions  Access: Left AVG    Basic Metabolic Panel: Recent Labs  Lab 04/26/19 0139 04/26/19 0411 04/27/19 0546 04/28/19 0503 04/29/19 0856  NA 138 138 139 139 138  K 4.1 4.5 4.6 4.4 4.2  CL 95* 97* 101 99 100  CO2 29 28 25 27 25   GLUCOSE 128* 149* 101* 89 94  BUN 49* 49* 63* 32* 46*  CREATININE 10.70* 10.96* 12.66* 8.37* 11.14*  CALCIUM 9.0 9.1 8.5* 8.6* 8.2*  PHOS  --   --  3.0  --  3.7    Liver Function Tests: Recent Labs  Lab 04/26/19 0139 04/27/19 0546 04/29/19 0856  AST 18  --   --   ALT 13  --   --   ALKPHOS 84  --   --   BILITOT 0.7  --   --   PROT 7.8  --   --   ALBUMIN 3.6 3.1* 2.9*   No results for input(s): LIPASE, AMYLASE in the last 168 hours. No results for input(s): AMMONIA in the last 168 hours.  CBC: Recent Labs  Lab 04/26/19 0139 04/26/19 0411 04/27/19 0546 04/27/19 0825 04/28/19 0503  04/29/19 0856  WBC 11.6* 13.5* 9.5 9.5 7.3 6.1  NEUTROABS 9.5*  --   --   --   --   --   HGB 11.2* 11.3* 9.4* 9.6* 10.0* 9.3*  HCT 35.7* 35.7* 29.1* 29.9* 31.3* 30.1*  MCV 73.0* 73.0* 72.0* 71.7* 72.5* 74.5*  PLT 185 147* 158 127* 141* 170    Cardiac Enzymes: No results for input(s): CKTOTAL, CKMB, CKMBINDEX, TROPONINI in the last 168 hours.  BNP: Invalid input(s): POCBNP  CBG: Recent Labs  Lab 04/28/19 1151 04/28/19 1647 04/28/19 2134 04/29/19 0408 04/29/19 0807  GLUCAP 85 123* 100* 80 97    Microbiology: Results for orders placed or performed during the hospital encounter of 04/25/19  SARS CORONAVIRUS 2 (TAT 6-24 HRS) Nasopharyngeal Nasopharyngeal Swab     Status: None   Collection Time: 04/26/19 12:26 AM   Specimen: Nasopharyngeal Swab  Result Value Ref Range Status   SARS Coronavirus 2 NEGATIVE NEGATIVE Final    Comment: (NOTE) SARS-CoV-2 target nucleic acids are NOT DETECTED. The SARS-CoV-2 RNA is generally detectable in upper and lower respiratory specimens during the acute phase of infection. Negative results do not preclude SARS-CoV-2 infection, do not rule out co-infections with other pathogens, and should not be used  as the sole basis for treatment or other patient management decisions. Negative results must be combined with clinical observations, patient history, and epidemiological information. The expected result is Negative. Fact Sheet for Patients: SugarRoll.be Fact Sheet for Healthcare Providers: https://www.woods-mathews.com/ This test is not yet approved or cleared by the [place] FDA and  has been authorized for detection and/or diagnosis of SARS-CoV-2 by FDA under an Emergency Use Authorization (EUA). This EUA will remain  in effect (meaning this test can be used) for the duration of the COVID-19 declaration under Section 56 4(b)(1) of the Act, 21 U.S.C. section 360bbb-3(b)(1), unless the  authorization is terminated or revoked sooner. Performed at [facility], [place] [address] [place], [place] [address]   Blood culture (routine x 2)     Status: None (Preliminary result)   Collection Time: 04/26/19  1:39 AM   Specimen: BLOOD  Result Value Ref Range Status   Specimen Description BLOOD RIGHT HAND  Final   Special Requests   Final    BOTTLES DRAWN AEROBIC AND ANAEROBIC Blood Culture results may not be optimal due to an inadequate volume of blood received in culture bottles   Culture   Final    NO GROWTH 3 DAYS Performed at [facility], [address] [place], [place] [address]    Report Status PENDING  Incomplete  Blood culture (routine x 2)     Status: None (Preliminary result)   Collection Time: 04/26/19  2:25 AM   Specimen: BLOOD  Result Value Ref Range Status   Specimen Description BLOOD RIGHT HAND  Final   Special Requests   Final    BOTTLES DRAWN AEROBIC AND ANAEROBIC Blood Culture adequate volume   Culture   Final    NO GROWTH 3 DAYS Performed at [facility], [address] [place], [place] [address]    Report Status PENDING  Incomplete    Coagulation Studies: No results for input(s): LABPROT, INR in the last 72 hours.  Urinalysis: No results for input(s): COLORURINE, LABSPEC, PHURINE, GLUCOSEU, HGBUR, BILIRUBINUR, KETONESUR, PROTEINUR, UROBILINOGEN, NITRITE, LEUKOCYTESUR in the last 72 hours.  Invalid input(s): APPERANCEUR    Imaging: No results found.   Medications:   . sodium chloride 20 mL/hr at 04/28/19 1600   . acetaminophen  1,000 mg Oral Once  . aspirin EC  81 mg Oral Daily  . atorvastatin  80 mg Oral q1800  . calcium acetate  1,334 mg Oral TID WC  . cefdinir  300 mg Oral Daily  . clopidogrel  75 mg Oral Daily  . feeding supplement (NEPRO CARB STEADY)  237 mL Oral BID BM  . heparin injection (subcutaneous)  5,000 Units Subcutaneous Q8H  . influenza vaccine adjuvanted  0.5 mL Intramuscular Tomorrow-1000   . insulin aspart  0-15 Units Subcutaneous TID WC  . levETIRAcetam  500 mg Oral Daily   And  . levETIRAcetam  250 mg Oral Q T,Th,Sat-1800  . memantine  5 mg Oral Daily  . metoprolol succinate  12.5 mg Oral Daily  . mirtazapine  15 mg Oral QHS  . multivitamin  1 tablet Oral QHS   sodium chloride, acetaminophen **OR** acetaminophen, bisacodyl, ondansetron **OR** ondansetron (ZOFRAN) IV  Assessment/ Plan:  [name] is a 68 y.o. Asian American ([place]) male with end stage renal disease on hemodialysis, history of renal transplant, hypertension, diabetes mellitus type II, peptic ulcer disease, gout, diastolic congestive heart failure. Admitted to [facility] on 04/25/2019 for Community acquired pneumonia, unspecified laterality [  J18.9] Sepsis without acute organ dysfunction, due to unspecified organism ([place]) [A41.9] Pneumonia [J18.9]  [facility] [address] TTS 195 minutes 71.5kg Left AVG  1. End Stage Renal Disease:  Seen and examined on hemodialysis treatment.  2. Hypertension: well controlled. Home regimen of losartan and metoprolol Usually gets midodrine before dialysis  3. Anemia of chronic kidney disease: EPO with HD treatment.   4. Secondary Hyperparathyroidism: outpatient labs at goal.  - calcium acetate with meals.  5. Lethargy:  Concern for overmedication with anti-epileptics - Neurology consultation.    LOS: 3 Harper Vandervoort 11/12/202010:47 AM

## 2019-04-29 NOTE — Progress Notes (Signed)
This note also relates to the following rows which could not be included: Pulse Rate - Cannot attach notes to unvalidated device data Resp - Cannot attach notes to unvalidated device data BP - Cannot attach notes to unvalidated device data  Hd started  

## 2019-04-29 NOTE — Care Management Important Message (Signed)
Important Message  Patient Details  Name: [name] MRN: [id number] Date of Birth: [birth year]   Medicare Important Message Given:  Yes     Dannette Barbara 04/29/2019, 1:40 PM

## 2019-04-29 NOTE — Progress Notes (Signed)
Hd completed 

## 2019-04-29 NOTE — Progress Notes (Signed)
PT Cancellation Note  Patient Details Name: [name] MRN: [id number] DOB: 1950/08/25   Cancelled Treatment:    Reason Eval/Treat Not Completed: Other (comment). Pt unavailable for therapy this date, in HD. Pt also is still extremely lethargic. Will re-attempt tomorrow once pt more alert.   [name] Junio 04/29/2019, 11:19 AM  Greggory Stallion, PT, DPT [phone]

## 2019-04-29 NOTE — Progress Notes (Signed)
OT Cancellation Note  Patient Details Name: [name] MRN: [id number] DOB: 1951-03-16   Cancelled Treatment:    Reason Eval/Treat Not Completed: Patient's level of consciousness Order received for OT evaluation and chart reviewed.  Patient lying in bed asleep and did not arouse to voice or tactile stimulation to R forearm.  Pt not able to participate due to level of consciousness.  Will attempt again tomorrow in afternoon if schedule allows since he is more alert then per NSG.  Chrys Racer, OTR/L, NTMTC ascom [phone] 04/29/19, 9:24 AM

## 2019-04-29 NOTE — Progress Notes (Signed)
Pre dialysis assessment 

## 2019-04-29 NOTE — Progress Notes (Signed)
PROGRESS NOTE    [name]  [id number] DOB: Nov 28, 1950 DOA: 04/25/2019 PCP: Kirk Ruths, MD   Brief Narrative:  [name] is a47 y.o.M with ESRD, seizures, dCHF, CAD s/p CABG, dementia, HTN and DM who presented with fever, cough.  In the ER, CXR showed pneumonia. Started on antibiotics   Assessment & Plan:   Active Problems:   CAP (community acquired pneumonia)   Pneumonia   Community-acquired pneumonia, right lower lobe Sepsis from pneumonia Patient presented with fever, tachypnea, leukocytosis and encephalopathy.  Afebrile ON. Pulse and RR improved. WBC normal -Continue on antibiotics to p.o. November 11 -SLP consult   Acute metabolic encephalopathy From pneumonia. At baseline, patient is interactive and mostly oriented, currently he is still sleepy and confused. No sedating medicines today. -Staff discussed with family reported general he is lethargic in the morning awake in the evening.  Asked neurology to evaluate antiepileptics for sedating effect and adjusting -Continue to treat underlying infection -PT still pending,, need to evaluate patient's ability to perform simple tasks like standing and pivoting in order to be able to return home  ESRD -Consulted nephrology for maintenance HD, appreciate recommendations -Midodrine before HD  Coronary disease, secondary prevention Chronic diastolic CHF Hypertension BP improved -Continueaspirin, atorvastatin -Stopped IV furosemide, manage volume with dialysis -Continuemetoprolol  Diabetes Glucosescontrolled -Continuesliding scale corrections  Dementia -Continuemirtazapine, memantine  History of seizures No seizure acitivty -Requested neurology consultation for adjustment of antiepileptics -Current dosing concerning for sedating effect especially in the   DVT prophylaxis: Heparin SQ  Code Status: full code    Code Status Orders  (From admission, onward)         Start      Ordered   04/26/19 0325  Full code  Continuous     04/26/19 0324        Code Status History    Date Active Date Inactive Code Status Order ID Comments User Context   11/13/2018 1742 11/15/2018 1602 Full Code [id number]  Sela Hua, MD Inpatient   02/19/2018 1202 02/27/2018 1941 DNR [id number]  Gorden Harms, MD Inpatient   09/16/2016 0325 09/18/2016 1709 Full Code [id number]  [place], [place], DO Inpatient   09/04/2016 2336 09/14/2016 1909 Full Code [id number]  Lance Coon, MD Inpatient   07/24/2016 0219 07/29/2016 2015 Full Code [id number]  Hugelmeyer, Alexis, DO Inpatient   02/26/2016 0603 02/27/2016 1705 Full Code [id number]  Harrie Foreman, MD Inpatient   02/26/2016 0540 02/26/2016 0603 DNR [id number]  Harrie Foreman, MD Inpatient   02/14/2015 0816 02/17/2015 1347 DNR [id number]  Epifanio Lesches, MD ED   12/12/2014 2044 12/16/2014 1926 DNR [id number]  Loletha Grayer, MD ED   11/05/2014 2209 11/09/2014 2148 Full Code [id number]  Henreitta Leber, MD Inpatient   09/30/2014 1348 10/02/2014 1902 Full Code [id number]  [name], MD Inpatient   09/29/2014 2036 09/30/2014 1348 Full Code [id number]  [name], MD Inpatient   Advance Care Planning Activity    Advance Directive Documentation     Most Recent Value  Type of Advance Directive  Healthcare Power of Attorney  Pre-existing out of facility DNR order (yellow form or pink MOST form)  -  "MOST" Form in Place?  -     Family Communication: lm with son Disposition Plan:   Patient will remain inpatient, requires continued PT evaluation for gait mobility balance transfer and strength training.  Patient currently not stable or safe for discharge home Consults called: None Admission status: Inpatient   Consultants:  Neurology informal to eval for adjustment of seizure meds 2/2 sedation  Procedures:  Dg Chest Portable 1 View  Result Date: 04/26/2019 CLINICAL DATA:  Firmness of breath EXAM: PORTABLE CHEST 1 VIEW COMPARISON:  Nov 13, 2018 FINDINGS: There is mild cardiomegaly. Overlying median sternotomy wires. Prominence to the central pulmonary vasculature seen. No large airspace consolidation. There is blunting of the left costophrenic angle which could be due to a trace effusion. No acute osseous abnormality. Vascular stent seen within the left arm. IMPRESSION: Mild cardiomegaly and pulmonary vascular congestion. Probable trace left pleural effusion. Electronically Signed   By: Prudencio Pair M.D.   On: 04/26/2019 00:29     Antimicrobials:   Omnicef day 2   Subjective: Patient more awake this morning We did confirm generally he is fairly sleepy in the mornings and wakes up as the day goes on  Objective: Vitals:   04/29/19 1330 04/29/19 1345 04/29/19 1348 04/29/19 1400  BP: 124/68 109/66 (!) 106/56 108/68  Pulse: 75 74 74 (!) 45  Resp: (!) 23 20 (!) 21 (!) 24  Temp:    (P) 98.3 F (36.8 C)  TempSrc:    (P) Axillary  SpO2:      Weight:      Height:        Intake/Output Summary (Last 24 hours) at 04/29/2019 1422 Last data filed at 04/29/2019 1400 Gross per 24 hour  Intake 336.78 ml  Output 1800 ml  Net -1463.22 ml   Filed Weights   04/26/19 0007 04/27/19 0904 04/27/19 1217  Weight: 77.1 kg 76.3 kg 75 kg    Examination:  General exam: Appears calm and comfortable  Respiratory system: Clear to auscultation. Respiratory effort normal. Cardiovascular system: S1 & S2 heard, RRR. No JVD, murmurs, rubs, gallops or clicks. No pedal edema. Gastrointestinal system: Abdomen is nondistended, soft and nontender. No organomegaly or masses felt. Normal bowel sounds heard. Central nervous system: Alert  No focal neurological deficits. Extremities: Warm well perfused, neurovascularly intact Skin: No rashes, lesions or ulcers Psychiatry: Judgement and insight appear normal. Mood & affect flat.     Data Reviewed: I have personally reviewed following labs and imaging studies  CBC: Recent Labs  Lab 04/26/19 0139  04/26/19 0411 04/27/19 0546 04/27/19 0825 04/28/19 0503 04/29/19 0856  WBC 11.6* 13.5* 9.5 9.5 7.3 6.1  NEUTROABS 9.5*  --   --   --   --   --   HGB 11.2* 11.3* 9.4* 9.6* 10.0* 9.3*  HCT 35.7* 35.7* 29.1* 29.9* 31.3* 30.1*  MCV 73.0* 73.0* 72.0* 71.7* 72.5* 74.5*  PLT 185 147* 158 127* 141* [id number]   Basic Metabolic Panel: Recent Labs  Lab 04/26/19 0139 04/26/19 0411 04/27/19 0546 04/28/19 0503 04/29/19 0856  NA 138 138 139 139 138  K 4.1 4.5 4.6 4.4 4.2  CL 95* 97* 101 99 100  CO2 29 28 25 27 25   GLUCOSE 128* 149* 101* 89 94  BUN 49* 49* 63* 32* 46*  CREATININE 10.70* 10.96* 12.66* 8.37* 11.14*  CALCIUM 9.0 9.1 8.5* 8.6* 8.2*  PHOS  --   --  3.0  --  3.7   GFR: Estimated Creatinine Clearance: 5.8 mL/min (A) (by C-G formula based on SCr of 11.14 mg/dL (H)). Liver Function Tests: Recent Labs  Lab 04/26/19 0139 04/27/19 0546 04/29/19 0856  AST 18  --   --   ALT 13  --   --   ALKPHOS 84  --   --   BILITOT  0.7  --   --   PROT 7.8  --   --   ALBUMIN 3.6 3.1* 2.9*   No results for input(s): LIPASE, AMYLASE in the last 168 hours. No results for input(s): AMMONIA in the last 168 hours. Coagulation Profile: No results for input(s): INR, PROTIME in the last 168 hours. Cardiac Enzymes: No results for input(s): CKTOTAL, CKMB, CKMBINDEX, TROPONINI in the last 168 hours. BNP (last 3 results) No results for input(s): PROBNP in the last 8760 hours. HbA1C: No results for input(s): HGBA1C in the last 72 hours. CBG: Recent Labs  Lab 04/28/19 1151 04/28/19 1647 04/28/19 2134 04/29/19 0408 04/29/19 0807  GLUCAP 85 123* 100* 80 97   Lipid Profile: No results for input(s): CHOL, HDL, LDLCALC, TRIG, CHOLHDL, LDLDIRECT in the last 72 hours. Thyroid Function Tests: No results for input(s): TSH, T4TOTAL, FREET4, T3FREE, THYROIDAB in the last 72 hours. Anemia Panel: No results for input(s): VITAMINB12, FOLATE, FERRITIN, TIBC, IRON, RETICCTPCT in the last 72 hours. Sepsis Labs:  Recent Labs  Lab 04/26/19 0139  PROCALCITON 0.21  LATICACIDVEN 1.5    Recent Results (from the past 240 hour(s))  SARS CORONAVIRUS 2 (TAT 6-24 HRS) Nasopharyngeal Nasopharyngeal Swab     Status: None   Collection Time: 04/26/19 12:26 AM   Specimen: Nasopharyngeal Swab  Result Value Ref Range Status   SARS Coronavirus 2 NEGATIVE NEGATIVE Final    Comment: (NOTE) SARS-CoV-2 target nucleic acids are NOT DETECTED. The SARS-CoV-2 RNA is generally detectable in upper and lower respiratory specimens during the acute phase of infection. Negative results do not preclude SARS-CoV-2 infection, do not rule out co-infections with other pathogens, and should not be used as the sole basis for treatment or other patient management decisions. Negative results must be combined with clinical observations, patient history, and epidemiological information. The expected result is Negative. Fact Sheet for Patients: SugarRoll.be Fact Sheet for Healthcare Providers: https://www.woods-mathews.com/ This test is not yet approved or cleared by the [place] FDA and  has been authorized for detection and/or diagnosis of SARS-CoV-2 by FDA under an Emergency Use Authorization (EUA). This EUA will remain  in effect (meaning this test can be used) for the duration of the COVID-19 declaration under Section 56 4(b)(1) of the Act, 21 U.S.C. section 360bbb-3(b)(1), unless the authorization is terminated or revoked sooner. Performed at [facility], [place] [address], [place], [place] [address]   Blood culture (routine x 2)     Status: None (Preliminary result)   Collection Time: 04/26/19  1:39 AM   Specimen: BLOOD  Result Value Ref Range Status   Specimen Description BLOOD RIGHT HAND  Final   Special Requests   Final    BOTTLES DRAWN AEROBIC AND ANAEROBIC Blood Culture results may not be optimal due to an inadequate volume of blood received in culture bottles    Culture   Final    NO GROWTH 3 DAYS Performed at [facility], [address], [place], [place] [address]    Report Status PENDING  Incomplete  Blood culture (routine x 2)     Status: None (Preliminary result)   Collection Time: 04/26/19  2:25 AM   Specimen: BLOOD  Result Value Ref Range Status   Specimen Description BLOOD RIGHT HAND  Final   Special Requests   Final    BOTTLES DRAWN AEROBIC AND ANAEROBIC Blood Culture adequate volume   Culture   Final    NO GROWTH 3 DAYS Performed at [facility],  [place], [place] [address]    Report Status PENDING  Incomplete         Radiology Studies: No results found.      Scheduled Meds: . acetaminophen  1,000 mg Oral Once  . aspirin EC  81 mg Oral Daily  . atorvastatin  80 mg Oral q1800  . calcium acetate  1,334 mg Oral TID WC  . cefdinir  300 mg Oral Daily  . clopidogrel  75 mg Oral Daily  . epoetin (EPOGEN/PROCRIT) injection  10,000 Units Intravenous Q T,Th,Sa-HD  . feeding supplement (NEPRO CARB STEADY)  237 mL Oral BID BM  . heparin injection (subcutaneous)  5,000 Units Subcutaneous Q8H  . influenza vaccine adjuvanted  0.5 mL Intramuscular Tomorrow-1000  . insulin aspart  0-15 Units Subcutaneous TID WC  . levETIRAcetam  500 mg Oral Daily   And  . levETIRAcetam  250 mg Oral Q T,Th,Sat-1800  . memantine  5 mg Oral Daily  . metoprolol succinate  12.5 mg Oral Daily  . mirtazapine  15 mg Oral QHS  . multivitamin  1 tablet Oral QHS   Continuous Infusions: . sodium chloride 20 mL/hr at 04/28/19 1600     LOS: 3 days    Time spent: 35 min     Nicolette Bang, MD [facility]  If 7PM-7AM, please contact night-coverage  04/29/2019, 2:22 PM

## 2019-04-30 ENCOUNTER — Inpatient Hospital Stay: Payer: Medicare Other

## 2019-04-30 DIAGNOSIS — R4182 Altered mental status, unspecified: Secondary | ICD-10-CM

## 2019-04-30 LAB — BLOOD GAS, ARTERIAL
Acid-Base Excess: 8.6 mmol/L — ABNORMAL HIGH (ref 0.0–2.0)
Bicarbonate: 31.6 mmol/L — ABNORMAL HIGH (ref 20.0–28.0)
FIO2: 21
O2 Saturation: 95.6 %
Patient temperature: 37.2
pCO2 arterial: 37 mmHg (ref 32.0–48.0)
pH, Arterial: 7.54 — ABNORMAL HIGH (ref 7.350–7.450)
pO2, Arterial: 70 mmHg — ABNORMAL LOW (ref 83.0–108.0)

## 2019-04-30 LAB — MRSA PCR SCREENING: MRSA by PCR: NEGATIVE

## 2019-04-30 LAB — BASIC METABOLIC PANEL
Anion gap: 15 (ref 5–15)
BUN: 29 mg/dL — ABNORMAL HIGH (ref 8–23)
CO2: 26 mmol/L (ref 22–32)
Calcium: 8.8 mg/dL — ABNORMAL LOW (ref 8.9–10.3)
Chloride: 97 mmol/L — ABNORMAL LOW (ref 98–111)
Creatinine, Ser: 8.8 mg/dL — ABNORMAL HIGH (ref 0.61–1.24)
GFR calc Af Amer: 6 mL/min — ABNORMAL LOW (ref >60)
GFR calc non Af Amer: 6 mL/min — ABNORMAL LOW (ref >60)
Glucose, Bld: 142 mg/dL — ABNORMAL HIGH (ref 70–99)
Potassium: 4.2 mmol/L (ref 3.5–5.1)
Sodium: 138 mmol/L (ref 135–145)

## 2019-04-30 LAB — GLUCOSE, CAPILLARY
Glucose-Capillary: 98 mg/dL (ref 70–99)
Glucose-Capillary: 173 mg/dL — ABNORMAL HIGH (ref 70–99)
Glucose-Capillary: 130 mg/dL — ABNORMAL HIGH (ref 70–99)
Glucose-Capillary: 152 mg/dL — ABNORMAL HIGH (ref 70–99)
Glucose-Capillary: 100 mg/dL — ABNORMAL HIGH (ref 70–99)

## 2019-04-30 LAB — PROCALCITONIN: Procalcitonin: 2.32 ng/mL

## 2019-04-30 LAB — AMMONIA: Ammonia: 16 umol/L (ref 9–35)

## 2019-04-30 MED ORDER — SODIUM CHLORIDE 0.9 % IV SOLN
2.0000 g | INTRAVENOUS | Status: DC
  Filled 2019-04-30 (×2): qty 2

## 2019-04-30 MED ORDER — LEVETIRACETAM 100 MG/ML PO SOLN
500.0000 mg | Freq: Every day | ORAL | Status: DC
  Administered 2019-05-01 – 2019-05-02 (×2): 500 mg via ORAL
  Filled 2019-04-30 (×3): qty 5

## 2019-04-30 MED ORDER — LEVETIRACETAM 100 MG/ML PO SOLN
250.0000 mg | ORAL | Status: DC
  Filled 2019-04-30 (×2): qty 2.5

## 2019-04-30 MED ORDER — CHLORHEXIDINE GLUCONATE CLOTH 2 % EX PADS
6.0000 | MEDICATED_PAD | Freq: Every day | CUTANEOUS | Status: DC
  Administered 2019-04-30 – 2019-05-02 (×3): 6 via TOPICAL

## 2019-04-30 MED ORDER — CEFDINIR 250 MG/5ML PO SUSR
300.0000 mg | Freq: Every day | ORAL | Status: DC
  Filled 2019-04-30: qty 6

## 2019-04-30 MED ORDER — CALCIUM ACETATE (PHOS BINDER) 667 MG/5ML PO SOLN
1334.0000 mg | Freq: Three times a day (TID) | ORAL | Status: DC
  Administered 2019-05-01: 1334 mg via ORAL
  Filled 2019-04-30 (×11): qty 10

## 2019-04-30 MED ORDER — SODIUM CHLORIDE 0.9 % IV SOLN
1.0000 g | Freq: Once | INTRAVENOUS | Status: AC
  Administered 2019-04-30: 17:00:00 1 g via INTRAVENOUS
  Filled 2019-04-30: qty 1

## 2019-04-30 NOTE — Progress Notes (Signed)
Pharmacy Antibiotic Note  [name] is a 68 y.o. male admitted on 04/25/2019. Pharmacy has been consulted for cefepime dosing for pneumonia.  Patient on TTS HD.  Plan: Cefepime 1 g today. Will continue with cefepime 2 g IV after each dialysis session starting tomorrow.  Height: 5\' 9"  (175.3 cm) Weight: 151 lb 7.3 oz (68.7 kg) IBW/kg (Calculated) : 70.7  Temp (24hrs), Avg:99.3 F (37.4 C), Min:98.3 F (36.8 C), Max:100.2 F (37.9 C)  Recent Labs  Lab 04/26/19 0139 04/26/19 0411 04/27/19 0546 04/27/19 0825 04/28/19 0503 04/29/19 0856 04/30/19 1446  WBC 11.6* 13.5* 9.5 9.5 7.3 6.1  --   CREATININE 10.70* 10.96* 12.66*  --  8.37* 11.14* 8.80*  LATICACIDVEN 1.5  --   --   --   --   --   --     Estimated Creatinine Clearance: 7.9 mL/min (A) (by C-G formula based on SCr of 8.8 mg/dL (H)).    Allergies  Allergen Reactions  . Ivp Dye [Iodinated Diagnostic Agents] Other (See Comments)    Pt denied  . Metrizamide Other (See Comments)    Contains iodine    Antimicrobials this admission: Cefepime 11/13 >>    Thank you for allowing pharmacy to be a part of this patient's care.  Tawnya Crook, PharmD 04/30/2019 4:27 PM

## 2019-04-30 NOTE — Progress Notes (Signed)
[facility]  ROUNDING NOTE   Subjective:   Lethargic. Only answering yes and no commands  Hemodialysis treatment yesterday. Tolerated treatment well.   Objective:  Vital signs in last 24 hours:  Temp:  [98.3 F (36.8 C)-98.8 F (37.1 C)] 98.3 F (36.8 C) (11/13 0432) Pulse Rate:  [45-93] 77 (11/13 0432) Resp:  [16-25] 16 (11/13 0432) BP: (106-145)/(56-81) 138/81 (11/13 0432) SpO2:  [98 %] 98 % (11/13 0432)  Weight change:  Filed Weights   04/26/19 0007 04/27/19 0904 04/27/19 1217  Weight: 77.1 kg 76.3 kg 75 kg    Intake/Output: I/O last 3 completed shifts: In: -  Out: 1800 [Other:1800]   Intake/Output this shift:  No intake/output data recorded.  Physical Exam: General: NAD, laying in bed  Head: Normocephalic, atraumatic. Moist oral mucosal membranes  Eyes: Anicteric, PERRL  Neck: Supple, trachea midline  Lungs:  Diminished bilaterally  Heart: Regular rate and rhythm  Abdomen:  Soft, nontender,   Extremities:  + peripheral edema.  Neurologic: Lethargic, appropriately answers questions  Skin: No lesions  Access: Left AVG    Basic Metabolic Panel: Recent Labs  Lab 04/26/19 0139 04/26/19 0411 04/27/19 0546 04/28/19 0503 04/29/19 0856  NA 138 138 139 139 138  K 4.1 4.5 4.6 4.4 4.2  CL 95* 97* 101 99 100  CO2 29 28 25 27 25   GLUCOSE 128* 149* 101* 89 94  BUN 49* 49* 63* 32* 46*  CREATININE 10.70* 10.96* 12.66* 8.37* 11.14*  CALCIUM 9.0 9.1 8.5* 8.6* 8.2*  PHOS  --   --  3.0  --  3.7    Liver Function Tests: Recent Labs  Lab 04/26/19 0139 04/27/19 0546 04/29/19 0856  AST 18  --   --   ALT 13  --   --   ALKPHOS 84  --   --   BILITOT 0.7  --   --   PROT 7.8  --   --   ALBUMIN 3.6 3.1* 2.9*   No results for input(s): LIPASE, AMYLASE in the last 168 hours. No results for input(s): AMMONIA in the last 168 hours.  CBC: Recent Labs  Lab 04/26/19 0139 04/26/19 0411 04/27/19 0546 04/27/19 0825 04/28/19 0503 04/29/19 0856  WBC  11.6* 13.5* 9.5 9.5 7.3 6.1  NEUTROABS 9.5*  --   --   --   --   --   HGB 11.2* 11.3* 9.4* 9.6* 10.0* 9.3*  HCT 35.7* 35.7* 29.1* 29.9* 31.3* 30.1*  MCV 73.0* 73.0* 72.0* 71.7* 72.5* 74.5*  PLT 185 147* 158 127* 141* 170    Cardiac Enzymes: No results for input(s): CKTOTAL, CKMB, CKMBINDEX, TROPONINI in the last 168 hours.  BNP: Invalid input(s): POCBNP  CBG: Recent Labs  Lab 04/29/19 0807 04/29/19 1700 04/29/19 2139 04/29/19 2153 04/30/19 0742  GLUCAP 97 85 176* 178* 98    Microbiology: Results for orders placed or performed during the hospital encounter of 04/25/19  SARS CORONAVIRUS 2 (TAT 6-24 HRS) Nasopharyngeal Nasopharyngeal Swab     Status: None   Collection Time: 04/26/19 12:26 AM   Specimen: Nasopharyngeal Swab  Result Value Ref Range Status   SARS Coronavirus 2 NEGATIVE NEGATIVE Final    Comment: (NOTE) SARS-CoV-2 target nucleic acids are NOT DETECTED. The SARS-CoV-2 RNA is generally detectable in upper and lower respiratory specimens during the acute phase of infection. Negative results do not preclude SARS-CoV-2 infection, do not rule out co-infections with other pathogens, and should not be used as the sole basis for  treatment or other patient management decisions. Negative results must be combined with clinical observations, patient history, and epidemiological information. The expected result is Negative. Fact Sheet for Patients: SugarRoll.be Fact Sheet for Healthcare Providers: https://www.woods-mathews.com/ This test is not yet approved or cleared by the [place] FDA and  has been authorized for detection and/or diagnosis of SARS-CoV-2 by FDA under an Emergency Use Authorization (EUA). This EUA will remain  in effect (meaning this test can be used) for the duration of the COVID-19 declaration under Section 56 4(b)(1) of the Act, 21 U.S.C. section 360bbb-3(b)(1), unless the authorization is terminated  or revoked sooner. Performed at [facility], [place] [address] [place], [place] [address]   Blood culture (routine x 2)     Status: None (Preliminary result)   Collection Time: 04/26/19  1:39 AM   Specimen: BLOOD  Result Value Ref Range Status   Specimen Description BLOOD RIGHT HAND  Final   Special Requests   Final    BOTTLES DRAWN AEROBIC AND ANAEROBIC Blood Culture results may not be optimal due to an inadequate volume of blood received in culture bottles   Culture   Final    NO GROWTH 4 DAYS Performed at [facility], [address] [place], [place] [address]    Report Status PENDING  Incomplete  Blood culture (routine x 2)     Status: None (Preliminary result)   Collection Time: 04/26/19  2:25 AM   Specimen: BLOOD  Result Value Ref Range Status   Specimen Description BLOOD RIGHT HAND  Final   Special Requests   Final    BOTTLES DRAWN AEROBIC AND ANAEROBIC Blood Culture adequate volume   Culture   Final    NO GROWTH 4 DAYS Performed at [facility], [address] [place], [place] [address]    Report Status PENDING  Incomplete    Coagulation Studies: No results for input(s): LABPROT, INR in the last 72 hours.  Urinalysis: No results for input(s): COLORURINE, LABSPEC, PHURINE, GLUCOSEU, HGBUR, BILIRUBINUR, KETONESUR, PROTEINUR, UROBILINOGEN, NITRITE, LEUKOCYTESUR in the last 72 hours.  Invalid input(s): APPERANCEUR    Imaging: No results found.   Medications:   . sodium chloride 20 mL/hr at 04/28/19 1600   . acetaminophen  1,000 mg Oral Once  . aspirin EC  81 mg Oral Daily  . atorvastatin  80 mg Oral q1800  . calcium acetate  1,334 mg Oral TID WC  . cefdinir  300 mg Oral Daily  . clopidogrel  75 mg Oral Daily  . epoetin (EPOGEN/PROCRIT) injection  10,000 Units Intravenous Q T,Th,Sa-HD  . feeding supplement (NEPRO CARB STEADY)  237 mL Oral BID BM  . heparin injection (subcutaneous)  5,000 Units Subcutaneous Q8H  . influenza vaccine  adjuvanted  0.5 mL Intramuscular Tomorrow-1000  . insulin aspart  0-15 Units Subcutaneous TID WC  . levETIRAcetam  500 mg Oral Daily   And  . levETIRAcetam  250 mg Oral Q T,Th,Sat-1800  . memantine  5 mg Oral Daily  . metoprolol succinate  12.5 mg Oral Daily  . mirtazapine  15 mg Oral QHS  . multivitamin  1 tablet Oral QHS   sodium chloride, acetaminophen **OR** acetaminophen, bisacodyl, ondansetron **OR** ondansetron (ZOFRAN) IV  Assessment/ Plan:  [name] is a 68 y.o. Asian American ([place]) male with end stage renal disease on hemodialysis, history of renal transplant, hypertension, diabetes mellitus type II, peptic ulcer disease, gout, diastolic congestive heart failure. Admitted to [facility] on 04/25/2019  for Community acquired pneumonia, unspecified laterality [J18.9] Sepsis without acute organ dysfunction, due to unspecified organism ([place]) [A41.9] Pneumonia [J18.9]  [facility] [address] TTS 195 minutes 71.5kg Left AVG  1. End Stage Renal Disease:  Dialysis for tomorrow. Continue TTS schedule  2. Hypertension: well controlled. Home regimen of losartan and metoprolol Usually gets midodrine before dialysis  3. Anemia of chronic kidney disease: EPO with HD treatment.   4. Secondary Hyperparathyroidism: outpatient labs at goal.  - calcium acetate with meals.  5. Lethargy:  Concern for overmedication with anti-epileptics - Neurology consultation. Recommend reduction in keppra dose   LOS: 4 [name] 11/13/202011:42 AM

## 2019-04-30 NOTE — Consult Note (Signed)
Reason for Consult: AMS Referring Physician: DR. Wyonia Hough   CC: AMS  HPI: [name] is an 68 y.o. male with history of end-stage renal disease on hemodialysis on Tuesday Thursday and Saturday, chronic diastolic CHF, seizures, coronary artery disease status post CABG, dyslipidemia, hypertension and type 2 diabetes mellitus, who presented to the emergency room with acute onset of cough with associated fever. Patient was found to have CAP and started on antibiotics. Patient's mental status has been deteriorating for the past few days. Keppra decreased.   Past Medical History:  Diagnosis Date  . Chronic diastolic congestive heart failure ([place])   . Chronic disease anemia   . ESRD (end stage renal disease) on dialysis ([place])    "[facility] [place]; [name]" (09/29/2014)  . GERD (gastroesophageal reflux disease)   . Gout   . High cholesterol   . History of blood transfusion    "related to anemia"  . History of stomach ulcers   . Hypertension   . Type II diabetes mellitus ([place])     Past Surgical History:  Procedure Laterality Date  . ARTERIOVENOUS GRAFT PLACEMENT Left ~ 1996  . CARDIAC CATHETERIZATION  09/29/2014   "[place]"  . CARDIAC CATHETERIZATION N/A 02/26/2016   Procedure: Left Heart Cath and Coronary Angiography Possible PCI;  Surgeon: [name], MD;  Location: [facility];  Service: Cardiovascular;  Laterality: N/A;  . ENDOSCOPIC RETROGRADE CHOLANGIOPANCREATOGRAPHY (ERCP) WITH PROPOFOL N/A 09/17/2016   Procedure: ENDOSCOPIC RETROGRADE CHOLANGIOPANCREATOGRAPHY (ERCP) WITH PROPOFOL;  Surgeon: [name], MD;  Location: [facility] ENDOSCOPY;  Service: Endoscopy;  Laterality: N/A;  . ERCP N/A 09/10/2016   Procedure: ENDOSCOPIC RETROGRADE CHOLANGIOPANCREATOGRAPHY (ERCP);  Surgeon: [name], MD;  Location: [facility] ENDOSCOPY;  Service: Endoscopy;  Laterality: N/A;  . KIDNEY TRANSPLANT Right 2004  . NEPHRECTOMY TRANSPLANTED ORGAN  2015  . PERCUTANEOUS CORONARY STENT INTERVENTION  (PCI-S) N/A 09/30/2014   Procedure: PERCUTANEOUS CORONARY STENT INTERVENTION (PCI-S);  Surgeon: [name], MD;  Location: [facility] CATH LAB;  Service: Cardiovascular;  Laterality: N/A;  . PERITONEAL CATHETER INSERTION  02/2014  . PERITONEAL CATHETER REMOVAL  08/2014  . THROMBECTOMY / ARTERIOVENOUS GRAFT REVISION  2015    Family History  Problem Relation Age of Onset  . Hypertension Mother   . Stroke Mother   . Hypertension Father   . Diabetes Mellitus II Father   . Asthma Father     Social History:  reports that he has quit smoking. His smoking use included cigarettes. He has never used smokeless tobacco. He reports that he does not drink alcohol or use drugs.  Allergies  Allergen Reactions  . Ivp Dye [Iodinated Diagnostic Agents] Other (See Comments)    Pt denied  . Metrizamide Other (See Comments)    Contains iodine    Medications: I have reviewed the patient's current medications.  ROS: Unable to obtain as not verbal  Physical Examination: Blood pressure 136/73, pulse (!) 108, temperature 99.6 F (37.6 C), temperature source Oral, resp. rate (!) 24, height 5\' 3"  (1.6 m), weight 75 kg, SpO2 94 %.  Not following commands Withdraws from pain all through out Not verbal Does track through the room.   Laboratory Studies:   Basic Metabolic Panel: Recent Labs  Lab 04/26/19 0139 04/26/19 0411 04/27/19 0546 04/28/19 0503 04/29/19 0856  NA 138 138 139 139 138  K 4.1 4.5 4.6 4.4 4.2  CL 95* 97* 101 99 100  CO2 29 28 25 27 25   GLUCOSE 128* 149* 101* 89 94  BUN 49*  49* 63* 32* 46*  CREATININE 10.70* 10.96* 12.66* 8.37* 11.14*  CALCIUM 9.0 9.1 8.5* 8.6* 8.2*  PHOS  --   --  3.0  --  3.7    Liver Function Tests: Recent Labs  Lab 04/26/19 0139 04/27/19 0546 04/29/19 0856  AST 18  --   --   ALT 13  --   --   ALKPHOS 84  --   --   BILITOT 0.7  --   --   PROT 7.8  --   --   ALBUMIN 3.6 3.1* 2.9*   No results for input(s): LIPASE, AMYLASE in the last 168 hours. No  results for input(s): AMMONIA in the last 168 hours.  CBC: Recent Labs  Lab 04/26/19 0139 04/26/19 0411 04/27/19 0546 04/27/19 0825 04/28/19 0503 04/29/19 0856  WBC 11.6* 13.5* 9.5 9.5 7.3 6.1  NEUTROABS 9.5*  --   --   --   --   --   HGB 11.2* 11.3* 9.4* 9.6* 10.0* 9.3*  HCT 35.7* 35.7* 29.1* 29.9* 31.3* 30.1*  MCV 73.0* 73.0* 72.0* 71.7* 72.5* 74.5*  PLT 185 147* 158 127* 141* 170    Cardiac Enzymes: No results for input(s): CKTOTAL, CKMB, CKMBINDEX, TROPONINI in the last 168 hours.  BNP: Invalid input(s): POCBNP  CBG: Recent Labs  Lab 04/29/19 2139 04/29/19 2153 04/30/19 0742 04/30/19 1143 04/30/19 1444  GLUCAP 176* 178* 98 173* 152*    Microbiology: Results for orders placed or performed during the hospital encounter of 04/25/19  SARS CORONAVIRUS 2 (TAT 6-24 HRS) Nasopharyngeal Nasopharyngeal Swab     Status: None   Collection Time: 04/26/19 12:26 AM   Specimen: Nasopharyngeal Swab  Result Value Ref Range Status   SARS Coronavirus 2 NEGATIVE NEGATIVE Final    Comment: (NOTE) SARS-CoV-2 target nucleic acids are NOT DETECTED. The SARS-CoV-2 RNA is generally detectable in upper and lower respiratory specimens during the acute phase of infection. Negative results do not preclude SARS-CoV-2 infection, do not rule out co-infections with other pathogens, and should not be used as the sole basis for treatment or other patient management decisions. Negative results must be combined with clinical observations, patient history, and epidemiological information. The expected result is Negative. Fact Sheet for Patients: SugarRoll.be Fact Sheet for Healthcare Providers: https://www.woods-mathews.com/ This test is not yet approved or cleared by the [place] FDA and  has been authorized for detection and/or diagnosis of SARS-CoV-2 by FDA under an Emergency Use Authorization (EUA). This EUA will remain  in effect (meaning this  test can be used) for the duration of the COVID-19 declaration under Section 56 4(b)(1) of the Act, 21 U.S.C. section 360bbb-3(b)(1), unless the authorization is terminated or revoked sooner. Performed at [facility], [place] [address] [place], [place] [address]   Blood culture (routine x 2)     Status: None (Preliminary result)   Collection Time: 04/26/19  1:39 AM   Specimen: BLOOD  Result Value Ref Range Status   Specimen Description BLOOD RIGHT HAND  Final   Special Requests   Final    BOTTLES DRAWN AEROBIC AND ANAEROBIC Blood Culture results may not be optimal due to an inadequate volume of blood received in culture bottles   Culture   Final    NO GROWTH 4 DAYS Performed at [facility], [address] [place], [place] [address]    Report Status PENDING  Incomplete  Blood culture (routine x 2)     Status: None (Preliminary result)  Collection Time: 04/26/19  2:25 AM   Specimen: BLOOD  Result Value Ref Range Status   Specimen Description BLOOD RIGHT HAND  Final   Special Requests   Final    BOTTLES DRAWN AEROBIC AND ANAEROBIC Blood Culture adequate volume   Culture   Final    NO GROWTH 4 DAYS Performed at [facility], [address] [place], [place] [address]    Report Status PENDING  Incomplete    Coagulation Studies: No results for input(s): LABPROT, INR in the last 72 hours.  Urinalysis: No results for input(s): COLORURINE, LABSPEC, PHURINE, GLUCOSEU, HGBUR, BILIRUBINUR, KETONESUR, PROTEINUR, UROBILINOGEN, NITRITE, LEUKOCYTESUR in the last 168 hours.  Invalid input(s): APPERANCEUR  Lipid Panel:     Component Value Date/Time   CHOL 69 02/20/2018 0454   CHOL 103 08/06/2013 0551   TRIG 88 02/20/2018 0454   TRIG 191 08/06/2013 0551   HDL 31 (L) 02/20/2018 0454   HDL 32 (L) 08/06/2013 0551   CHOLHDL 2.2 02/20/2018 0454   VLDL 18 02/20/2018 0454   VLDL 38 08/06/2013 0551   LDLCALC 20 02/20/2018 0454   LDLCALC 33 08/06/2013 0551     HgbA1C:  Lab Results  Component Value Date   HGBA1C 5.8 (H) 04/26/2019    Urine Drug Screen:  No results found for: LABOPIA, COCAINSCRNUR, LABBENZ, AMPHETMU, THCU, LABBARB  Alcohol Level: No results for input(s): ETH in the last 168 hours.    Imaging: No results found.   Assessment/Plan:  68 y.o. male with history of end-stage renal disease on hemodialysis on Tuesday Thursday and Saturday, chronic diastolic CHF, seizures, coronary artery disease status post CABG, dyslipidemia, hypertension and type 2 diabetes mellitus, who presented to the emergency room with acute onset of cough with associated fever. Patient was found to have CAP and started on antibiotics. Patient's mental status has been deteriorating for the past few days. Keppra decreased.   - Symptoms likely in combination of hypoactive delirium and metabolic in nature - No clear focality on exam as he is able to withdraw from pain bilaterally - CTH to be ordered now.  - Pt is on Keppra daily 500mg  and 250 post HD. Not convinced Keppra causing this sedation as it was decreased - no clear neck stiffness to suggest meningitis - will follow imaging 04/30/2019, 3:12 PM

## 2019-04-30 NOTE — Progress Notes (Addendum)
OT Cancellation Note  Patient Details Name: Mathaniel Vivirito MRN: [id number] DOB: November 30, 1950   Cancelled Treatment:    Reason Eval/Treat Not Completed: Fatigue/lethargy limiting ability to participate. Consult received, chart reviewed. Spoke with RN. Pt currently lethargic, unable to meaningfully and safely participate in therapy at this time. Will re-attempt OT evaluation at later date/time as pt is medically appropriate and able to participate.   Addendum, 3:25pm: Pt transferred to ICU for change in status. Will complete current OT order. Please re-consult when pt is medically appropriate to participate.  Jeni Salles, MPH, MS, OTR/L ascom [phone] 04/30/19, 3:25 PM

## 2019-04-30 NOTE — Progress Notes (Signed)
eeg completed ° °

## 2019-04-30 NOTE — Progress Notes (Addendum)
PT Cancellation Note  Patient Details Name: [name] MRN: [id number] DOB: [birth year]   Cancelled Treatment:    Reason Eval/Treat Not Completed: Other (comment). Per conversation with OT, spoke with RN. Pt currently lethargic, unable to meaningfully and safely participate in therapy at this time. Will re-attempt PT evaluation at later date/time as pt is medically appropriate and able to participate.   [id number]: Pt now transferred to ICU secondary to rapid response. Did not receive continue upon transfer order. Due to change in status, will complete current order. Please re-order when medically stable.   [name] 04/30/2019, 2:11 PM  [name], PT, DPT [phone]

## 2019-04-30 NOTE — Progress Notes (Signed)
SLP Cancellation Note  Patient Details Name: [name] MRN: [id number] DOB: Apr 10, 1951   Cancelled treatment:       Reason Eval/Treat Not Completed: Medical issues which prohibited therapy(chart reviewed; pt transferred to CCU this PM). Pt presented w/ decreased alertness this PM. A Rapid Response was called per NSG.  In light of pt's decline in alertness and medical status, recommend strict aspiration precautions w/ any oral intake including meds d/t increased risk for aspiration at this time. Recommend HOLD any oral intake if pt cannot fully arouse/alert for safe swallowing(NSG to assess each time). Pt remains on an oral diet currently per chart. He has been recommended to have assistance at meals, aspiration precautions.  ST services can f/u w/ pt's status for any reassessment indicated per NSG/MD request.      Orinda Kenner, [place], CCC-SLP [facility] 04/30/2019, 3:28 PM

## 2019-04-30 NOTE — Progress Notes (Signed)
PROGRESS NOTE    Lc Oblander  [id number] DOB: [birth year] DOA: 04/25/2019 PCP: David Ruths, MD   Brief Narrative:  [name] is a57 y.o.M with ESRD, seizures, dCHF, CAD s/p CABG, dementia, HTN and DM who presented with fever, cough. In the ER, CXR showed pneumonia. Started on antibiotics   Assessment & Plan:   Active Problems:   CAP (community acquired pneumonia)   Pneumonia   Community-acquired pneumonia, right lower lobe Sepsis from pneumonia Patient presented with fever, tachypnea, leukocytosis and encephalopathy. Afebrile ON. Pulse and RR improved. WBC normal - Continue on antibiotics to p.o. November 11 - SLP consult recommended dysphagia level 3 mechanical soft, thin liquids   Acute metabolic encephalopathy Thought to be from pneumonia.  -At baseline, patient previously reported as interactive and mostly oriented, further discussions with family 11/13 indicated he generally stays in bed and is minimally interactive - Throughout the hospital stay he has been  sleepy and confused.  - I personally discussed with family reported general he is lethargic in the morning and although he was initially reported as more awake in the evening he indicated he tends to be lethargic and remain in bed  - Also indicated prior to admission he does require 2 person assist to get into a wheelchair and is placed on the couch in the living room - Asked neurology to evaluate antiepileptics and they rec decreasing antiepileptics 11/11 which were decreased with minimal change - Patient with persistent mental status changes, obtaining a stat CT of head and questing neurology for formal consult - ABG CBC and BMP and ammonia - check cxr - Continue to treat underlying infection - PT still pending,, need to evaluate patient's ability to perform simple tasks like standing and pivoting although as above family reported he has been a 2 person assist at home prior to admission   ESRD -Consultednephrology for maintenance HD, appreciate recommendations -Midodrine before HD  Coronary disease, secondary prevention Chronic diastolic CHF Hypertension BP improved -Continueaspirin, atorvastatin -Stopped IV furosemide, manage volume with dialysis -Continuemetoprolol  Diabetes Glucosescontrolled -Continuesliding scale corrections  Dementia -Continuemirtazapine, memantine  History of seizures No seizure acitivty -Request neurology consultation for adjustment of antiepileptics and evaluation of persistent AMS -Current dosing concerning for sedating effect especially in the   DVT prophylaxis: Heparin SQ  Code Status: FULL    Code Status Orders  (From admission, onward)         Start     Ordered   04/26/19 0325  Full code  Continuous     04/26/19 0324        Code Status History    Date Active Date Inactive Code Status Order ID Comments User Context   11/13/2018 1742 11/15/2018 1602 Full Code [id number]  Sela Hua, MD Inpatient   02/19/2018 1202 02/27/2018 1941 DNR [id number]  Gorden Harms, MD Inpatient   09/16/2016 0325 09/18/2016 1709 Full Code [id number]  [place], [place], DO Inpatient   09/04/2016 2336 09/14/2016 1909 Full Code [id number]  Lance Coon, MD Inpatient   07/24/2016 0219 07/29/2016 2015 Full Code [id number]  Harvie Bridge, DO Inpatient   02/26/2016 0603 02/27/2016 1705 Full Code [id number]  Harrie Foreman, MD Inpatient   02/26/2016 0540 02/26/2016 0603 DNR [id number]  Harrie Foreman, MD Inpatient   02/14/2015 0816 02/17/2015 1347 DNR [id number]  Epifanio Lesches, MD ED   12/12/2014 2044 12/16/2014 1926 DNR [id number]  Loletha Grayer, MD ED   11/05/2014 2209 11/09/2014 2148 Full Code [id number]  Henreitta Leber,  MD Inpatient   09/30/2014 1348 10/02/2014 1902 Full Code [id number]  [name], MD Inpatient   09/29/2014 2036 09/30/2014 1348 Full Code [id number]  [name], MD Inpatient   Advance Care Planning Activity     Advance Directive Documentation     Most Recent Value  Type of Advance Directive  Healthcare Power of Attorney  Pre-existing out of facility DNR order (yellow form or pink MOST form)  -  "MOST" Form in Place?  -     Family Communication: Discussed with daughter today Disposition Plan:   Patient remained inpatient requiring respiratory intervention secondary to rapid respiratory rate, mental status changes are unclear etiology.  Also requires expert subspecialty evaluation with neurology.  Clinically not stable for discharge Consults called: Neurology  admission status: Inpatient   Consultants:   Neurology  Procedures:  Dg Chest Portable 1 View  Result Date: 04/26/2019 CLINICAL DATA:  Firmness of breath EXAM: PORTABLE CHEST 1 VIEW COMPARISON:  Nov 13, 2018 FINDINGS: There is mild cardiomegaly. Overlying median sternotomy wires. Prominence to the central pulmonary vasculature seen. No large airspace consolidation. There is blunting of the left costophrenic angle which could be due to a trace effusion. No acute osseous abnormality. Vascular stent seen within the left arm. IMPRESSION: Mild cardiomegaly and pulmonary vascular congestion. Probable trace left pleural effusion. Electronically Signed   By: Prudencio Pair M.D.   On: 04/26/2019 00:29     Antimicrobials:   Omnicef day 3   Subjective: Patient moderately tachypneic, Transferred to ICU for BiPAP, closer monitoring Stat CT of head pending neurology saw the patient in consultation   Objective: Vitals:   04/29/19 2015 04/30/19 0432 04/30/19 1254 04/30/19 1450  BP: (!) 145/70 138/81 131/65 136/73  Pulse: 93 77 98 (!) 108  Resp: 16 16 (!) 24   Temp: 98.4 F (36.9 C) 98.3 F (36.8 C) 99.9 F (37.7 C) 99.6 F (37.6 C)  TempSrc: Oral Oral Axillary Oral  SpO2: 98% 98% 94%   Weight:      Height:       No intake or output data in the 24 hours ending 04/30/19 1502 Filed Weights   04/26/19 0007 04/27/19 0904 04/27/19 1217   Weight: 77.1 kg 76.3 kg 75 kg    Examination:  General exam: Appears calm, only lethargic unchanged from prior Respiratory system: Mild rhonchi bilaterally no wheezing. Cardiovascular system: S1 & S2 heard, RRR. No JVD, murmurs, rubs, gallops or clicks. No pedal edema. Gastrointestinal system: Abdomen is nondistended, soft and nontender. No organomegaly or masses felt. Normal bowel sounds heard. Central nervous system: Alert and oriented. No focal neurological deficits. Extremities: Warm well perfused, neurovascularly intact. Skin: No rashes, lesions or ulcers Psychiatry: Judgement and insight appear normal difficult to assess. Mood & affect flat.     Data Reviewed: I have personally reviewed following labs and imaging studies  CBC: Recent Labs  Lab 04/26/19 0139 04/26/19 0411 04/27/19 0546 04/27/19 0825 04/28/19 0503 04/29/19 0856  WBC 11.6* 13.5* 9.5 9.5 7.3 6.1  NEUTROABS 9.5*  --   --   --   --   --   HGB 11.2* 11.3* 9.4* 9.6* 10.0* 9.3*  HCT 35.7* 35.7* 29.1* 29.9* 31.3* 30.1*  MCV 73.0* 73.0* 72.0* 71.7* 72.5* 74.5*  PLT 185 147* 158 127* 141* [id number]   Basic Metabolic Panel: Recent Labs  Lab 04/26/19 0139 04/26/19 0411 04/27/19 0546 04/28/19 0503 04/29/19 0856  NA 138 138 139 139 138  K 4.1 4.5 4.6 4.4 4.2  CL 95* 97* 101 99 100  CO2 29 28 25 27 25   GLUCOSE 128* 149* 101* 89 94  BUN 49* 49* 63* 32* 46*  CREATININE 10.70* 10.96* 12.66* 8.37* 11.14*  CALCIUM 9.0 9.1 8.5* 8.6* 8.2*  PHOS  --   --  3.0  --  3.7   GFR: Estimated Creatinine Clearance: 5.8 mL/min (A) (by C-G formula based on SCr of 11.14 mg/dL (H)). Liver Function Tests: Recent Labs  Lab 04/26/19 0139 04/27/19 0546 04/29/19 0856  AST 18  --   --   ALT 13  --   --   ALKPHOS 84  --   --   BILITOT 0.7  --   --   PROT 7.8  --   --   ALBUMIN 3.6 3.1* 2.9*   No results for input(s): LIPASE, AMYLASE in the last 168 hours. No results for input(s): AMMONIA in the last 168 hours. Coagulation  Profile: No results for input(s): INR, PROTIME in the last 168 hours. Cardiac Enzymes: No results for input(s): CKTOTAL, CKMB, CKMBINDEX, TROPONINI in the last 168 hours. BNP (last 3 results) No results for input(s): PROBNP in the last 8760 hours. HbA1C: No results for input(s): HGBA1C in the last 72 hours. CBG: Recent Labs  Lab 04/29/19 2139 04/29/19 2153 04/30/19 0742 04/30/19 1143 04/30/19 1444  GLUCAP 176* 178* 98 173* 152*   Lipid Profile: No results for input(s): CHOL, HDL, LDLCALC, TRIG, CHOLHDL, LDLDIRECT in the last 72 hours. Thyroid Function Tests: No results for input(s): TSH, T4TOTAL, FREET4, T3FREE, THYROIDAB in the last 72 hours. Anemia Panel: No results for input(s): VITAMINB12, FOLATE, FERRITIN, TIBC, IRON, RETICCTPCT in the last 72 hours. Sepsis Labs: Recent Labs  Lab 04/26/19 0139  PROCALCITON 0.21  LATICACIDVEN 1.5    Recent Results (from the past 240 hour(s))  SARS CORONAVIRUS 2 (TAT 6-24 HRS) Nasopharyngeal Nasopharyngeal Swab     Status: None   Collection Time: 04/26/19 12:26 AM   Specimen: Nasopharyngeal Swab  Result Value Ref Range Status   SARS Coronavirus 2 NEGATIVE NEGATIVE Final    Comment: (NOTE) SARS-CoV-2 target nucleic acids are NOT DETECTED. The SARS-CoV-2 RNA is generally detectable in upper and lower respiratory specimens during the acute phase of infection. Negative results do not preclude SARS-CoV-2 infection, do not rule out co-infections with other pathogens, and should not be used as the sole basis for treatment or other patient management decisions. Negative results must be combined with clinical observations, patient history, and epidemiological information. The expected result is Negative. Fact Sheet for Patients: SugarRoll.be Fact Sheet for Healthcare Providers: https://www.woods-mathews.com/ This test is not yet approved or cleared by the [place] FDA and  has been authorized  for detection and/or diagnosis of SARS-CoV-2 by FDA under an Emergency Use Authorization (EUA). This EUA will remain  in effect (meaning this test can be used) for the duration of the COVID-19 declaration under Section 56 4(b)(1) of the Act, 21 U.S.C. section 360bbb-3(b)(1), unless the authorization is terminated or revoked sooner. Performed at [facility], [place] [address], [place], [place] [address]   Blood culture (routine x 2)     Status: None (Preliminary result)   Collection Time: 04/26/19  1:39 AM   Specimen: BLOOD  Result Value Ref Range Status   Specimen Description BLOOD RIGHT HAND  Final   Special Requests   Final    BOTTLES DRAWN AEROBIC AND ANAEROBIC Blood Culture results may not be optimal due to an inadequate volume of blood  received in culture bottles   Culture   Final    NO GROWTH 4 DAYS Performed at [facility], [address] [place], [place] [address]    Report Status PENDING  Incomplete  Blood culture (routine x 2)     Status: None (Preliminary result)   Collection Time: 04/26/19  2:25 AM   Specimen: BLOOD  Result Value Ref Range Status   Specimen Description BLOOD RIGHT HAND  Final   Special Requests   Final    BOTTLES DRAWN AEROBIC AND ANAEROBIC Blood Culture adequate volume   Culture   Final    NO GROWTH 4 DAYS Performed at [facility], [address] [place],  [address]    Report Status PENDING  Incomplete         Radiology Studies: No results found.      Scheduled Meds: . acetaminophen  1,000 mg Oral Once  . aspirin EC  81 mg Oral Daily  . atorvastatin  80 mg Oral q1800  . calcium acetate (Phos Binder)  1,334 mg Oral TID WC  . cefdinir  300 mg Oral Daily  . clopidogrel  75 mg Oral Daily  . epoetin (EPOGEN/PROCRIT) injection  10,000 Units Intravenous Q T,Th,Sa-HD  . feeding supplement (NEPRO CARB STEADY)  237 mL Oral BID BM  . heparin injection (subcutaneous)  5,000 Units Subcutaneous Q8H  .  influenza vaccine adjuvanted  0.5 mL Intramuscular Tomorrow-1000  . insulin aspart  0-15 Units Subcutaneous TID WC  . levETIRAcetam  500 mg Oral Daily   And  . [START ON 05/01/2019] levETIRAcetam  250 mg Oral Once per day on Tue Thu Sat  . memantine  5 mg Oral Daily  . metoprolol succinate  12.5 mg Oral Daily  . mirtazapine  15 mg Oral QHS  . multivitamin  1 tablet Oral QHS   Continuous Infusions: . sodium chloride 20 mL/hr at 04/28/19 1600     LOS: 4 days    Time spent: 35 min    Nicolette Bang, MD [facility]  If 7PM-7AM, please contact night-coverage  04/30/2019, 3:02 PM

## 2019-04-30 NOTE — Progress Notes (Signed)
Patient not responding to staff. Very lethargic. ICU nurse called. CBG and VS checked. Respirations between 35-45. Patient being transferred to ICU per ICU nurse recommendation. Dr. Wyonia Hough notified. ABG and labs drawn per orders. Patient sent ICU on bed and transported by [place] RN and ICU nurse

## 2019-05-01 DIAGNOSIS — F0390 Unspecified dementia without behavioral disturbance: Secondary | ICD-10-CM | POA: Diagnosis present

## 2019-05-01 DIAGNOSIS — G9341 Metabolic encephalopathy: Secondary | ICD-10-CM | POA: Diagnosis present

## 2019-05-01 DIAGNOSIS — R569 Unspecified convulsions: Secondary | ICD-10-CM

## 2019-05-01 LAB — CULTURE, BLOOD (ROUTINE X 2)
Culture: NO GROWTH
Culture: NO GROWTH
Special Requests: ADEQUATE

## 2019-05-01 LAB — HEPATITIS C ANTIBODY: HCV Ab: NONREACTIVE

## 2019-05-01 LAB — BASIC METABOLIC PANEL
Anion gap: 13 (ref 5–15)
BUN: 34 mg/dL — ABNORMAL HIGH (ref 8–23)
CO2: 25 mmol/L (ref 22–32)
Calcium: 8.6 mg/dL — ABNORMAL LOW (ref 8.9–10.3)
Chloride: 101 mmol/L (ref 98–111)
Creatinine, Ser: 10.6 mg/dL — ABNORMAL HIGH (ref 0.61–1.24)
GFR calc Af Amer: 5 mL/min — ABNORMAL LOW (ref >60)
GFR calc non Af Amer: 4 mL/min — ABNORMAL LOW (ref >60)
Glucose, Bld: 103 mg/dL — ABNORMAL HIGH (ref 70–99)
Potassium: 4.9 mmol/L (ref 3.5–5.1)
Sodium: 139 mmol/L (ref 135–145)

## 2019-05-01 LAB — GLUCOSE, CAPILLARY
Glucose-Capillary: 109 mg/dL — ABNORMAL HIGH (ref 70–99)
Glucose-Capillary: 124 mg/dL — ABNORMAL HIGH (ref 70–99)
Glucose-Capillary: 113 mg/dL — ABNORMAL HIGH (ref 70–99)
Glucose-Capillary: 157 mg/dL — ABNORMAL HIGH (ref 70–99)

## 2019-05-01 LAB — CBC
HCT: 33.9 % — ABNORMAL LOW (ref 39.0–52.0)
Hemoglobin: 10.6 g/dL — ABNORMAL LOW (ref 13.0–17.0)
MCH: 23 pg — ABNORMAL LOW (ref 26.0–34.0)
MCHC: 31.3 g/dL (ref 30.0–36.0)
MCV: 73.5 fL — ABNORMAL LOW (ref 80.0–100.0)
Platelets: 167 10*3/uL (ref 150–400)
RBC: 4.61 MIL/uL (ref 4.22–5.81)
RDW: 15.5 % (ref 11.5–15.5)
WBC: 8.4 10*3/uL (ref 4.0–10.5)
nRBC: 0.4 % — ABNORMAL HIGH (ref 0.0–0.2)

## 2019-05-01 LAB — HEPATITIS B SURFACE ANTIBODY,QUALITATIVE: Hep B S Ab: REACTIVE — AB

## 2019-05-01 LAB — HEPATITIS B SURFACE ANTIGEN: Hepatitis B Surface Ag: NONREACTIVE

## 2019-05-01 LAB — HEPATITIS B CORE ANTIBODY, IGM: Hep B C IgM: NONREACTIVE

## 2019-05-01 LAB — PROCALCITONIN: Procalcitonin: 1.94 ng/mL

## 2019-05-01 MED ORDER — LABETALOL HCL 5 MG/ML IV SOLN
10.0000 mg | INTRAVENOUS | Status: DC | PRN
  Administered 2019-05-01: 10 mg via INTRAVENOUS
  Filled 2019-05-01: qty 4

## 2019-05-01 NOTE — Progress Notes (Signed)
HD Tx completed   05/01/19 1755  Vital Signs  Pulse Rate 66  Resp (!) 25  BP 128/71  Oxygen Therapy  SpO2 99 %  During Hemodialysis Assessment  Blood Flow Rate (mL/min) 400 mL/min  Arterial Pressure (mmHg) -230 mmHg  Venous Pressure (mmHg) 250 mmHg  Transmembrane Pressure (mmHg) 70 mmHg  Ultrafiltration Rate (mL/min) 370 mL/min  Dialysate Flow Rate (mL/min) 600 ml/min  Conductivity: Machine  14  HD Safety Checks Performed Yes  Intra-Hemodialysis Comments Tx completed

## 2019-05-01 NOTE — Progress Notes (Signed)
[facility]  ROUNDING NOTE   Subjective:   Overnight in ICU. Now moved back to [facility], medical floor.  More awake and alert.   Objective:  Vital signs in last 24 hours:  Temp:  [97.7 F (36.5 C)-100.2 F (37.9 C)] 97.7 F (36.5 C) (11/14 1124) Pulse Rate:  [73-108] 73 (11/14 1124) Resp:  [15-31] 24 (11/14 1124) BP: (131-191)/(65-131) 179/88 (11/14 1124) SpO2:  [94 %-100 %] 99 % (11/14 1124) Weight:  [68.7 kg] 68.7 kg (11/13 1512)  Weight change:  Filed Weights   04/27/19 0904 04/27/19 1217 04/30/19 1512  Weight: 76.3 kg 75 kg 68.7 kg    Intake/Output: I/O last 3 completed shifts: In: 318.1 [I.V.:118.1; IV Piggyback:200] Out: -    Intake/Output this shift:  Total I/O In: 100 [P.O.:100] Out: -   Physical Exam: General: NAD, laying in bed  Head: Normocephalic, atraumatic. Moist oral mucosal membranes  Eyes: Anicteric, PERRL  Neck: Supple, trachea midline  Lungs:  Diminished bilaterally  Heart: Regular rate and rhythm  Abdomen:  Soft, nontender,   Extremities:  + peripheral edema.  Neurologic: Lethargic, appropriately answers questions  Skin: No lesions  Access: Left AVG    Basic Metabolic Panel: Recent Labs  Lab 04/27/19 0546 04/28/19 0503 04/29/19 0856 04/30/19 1446 05/01/19 0658  NA 139 139 138 138 139  K 4.6 4.4 4.2 4.2 4.9  CL 101 99 100 97* 101  CO2 25 27 25 26 25   GLUCOSE 101* 89 94 142* 103*  BUN 63* 32* 46* 29* 34*  CREATININE 12.66* 8.37* 11.14* 8.80* 10.60*  CALCIUM 8.5* 8.6* 8.2* 8.8* 8.6*  PHOS 3.0  --  3.7  --   --     Liver Function Tests: Recent Labs  Lab 04/26/19 0139 04/27/19 0546 04/29/19 0856  AST 18  --   --   ALT 13  --   --   ALKPHOS 84  --   --   BILITOT 0.7  --   --   PROT 7.8  --   --   ALBUMIN 3.6 3.1* 2.9*   No results for input(s): LIPASE, AMYLASE in the last 168 hours. Recent Labs  Lab 04/30/19 1446  AMMONIA 16    CBC: Recent Labs  Lab 04/26/19 0139  04/27/19 0546 04/27/19 0825  04/28/19 0503 04/29/19 0856 05/01/19 0658  WBC 11.6*   < > 9.5 9.5 7.3 6.1 8.4  NEUTROABS 9.5*  --   --   --   --   --   --   HGB 11.2*   < > 9.4* 9.6* 10.0* 9.3* 10.6*  HCT 35.7*   < > 29.1* 29.9* 31.3* 30.1* 33.9*  MCV 73.0*   < > 72.0* 71.7* 72.5* 74.5* 73.5*  PLT 185   < > 158 127* 141* 170 167   < > = values in this interval not displayed.    Cardiac Enzymes: No results for input(s): CKTOTAL, CKMB, CKMBINDEX, TROPONINI in the last 168 hours.  BNP: Invalid input(s): POCBNP  CBG: Recent Labs  Lab 04/30/19 1143 04/30/19 1444 04/30/19 1514 04/30/19 2158 05/01/19 0729  GLUCAP 173* 152* 130* 100* 109*    Microbiology: Results for orders placed or performed during the hospital encounter of 04/25/19  SARS CORONAVIRUS 2 (TAT 6-24 HRS) Nasopharyngeal Nasopharyngeal Swab     Status: None   Collection Time: 04/26/19 12:26 AM   Specimen: Nasopharyngeal Swab  Result Value Ref Range Status   SARS Coronavirus 2 NEGATIVE NEGATIVE Final  Comment: (NOTE) SARS-CoV-2 target nucleic acids are NOT DETECTED. The SARS-CoV-2 RNA is generally detectable in upper and lower respiratory specimens during the acute phase of infection. Negative results do not preclude SARS-CoV-2 infection, do not rule out co-infections with other pathogens, and should not be used as the sole basis for treatment or other patient management decisions. Negative results must be combined with clinical observations, patient history, and epidemiological information. The expected result is Negative. Fact Sheet for Patients: SugarRoll.be Fact Sheet for Healthcare Providers: https://www.woods-mathews.com/ This test is not yet approved or cleared by the [place] FDA and  has been authorized for detection and/or diagnosis of SARS-CoV-2 by FDA under an Emergency Use Authorization (EUA). This EUA will remain  in effect (meaning this test can be used) for the duration of  the COVID-19 declaration under Section 56 4(b)(1) of the Act, 21 U.S.C. section 360bbb-3(b)(1), unless the authorization is terminated or revoked sooner. Performed at [facility], [place] [address], [place], [place] [address]   Blood culture (routine x 2)     Status: None   Collection Time: 04/26/19  1:39 AM   Specimen: BLOOD  Result Value Ref Range Status   Specimen Description BLOOD RIGHT HAND  Final   Special Requests   Final    BOTTLES DRAWN AEROBIC AND ANAEROBIC Blood Culture results may not be optimal due to an inadequate volume of blood received in culture bottles   Culture   Final    NO GROWTH 5 DAYS Performed at [facility], [address] [place], [place] [address]    Report Status 05/01/2019 FINAL  Final  Blood culture (routine x 2)     Status: None   Collection Time: 04/26/19  2:25 AM   Specimen: BLOOD  Result Value Ref Range Status   Specimen Description BLOOD RIGHT HAND  Final   Special Requests   Final    BOTTLES DRAWN AEROBIC AND ANAEROBIC Blood Culture adequate volume   Culture   Final    NO GROWTH 5 DAYS Performed at [facility], [address] [place], [place] [address]    Report Status 05/01/2019 FINAL  Final  MRSA PCR Screening     Status: None   Collection Time: 04/30/19  3:14 PM   Specimen: Nasal Mucosa; Nasopharyngeal  Result Value Ref Range Status   MRSA by PCR NEGATIVE NEGATIVE Final    Comment:        The GeneXpert MRSA Assay (FDA approved for NASAL specimens only), is one component of a comprehensive MRSA colonization surveillance program. It is not intended to diagnose MRSA infection nor to guide or monitor treatment for MRSA infections. Performed at [facility], [address] [place], [place] [address]   CULTURE, BLOOD (ROUTINE X 2) w Reflex to ID Panel     Status: None (Preliminary result)   Collection Time: 04/30/19  4:07 PM   Specimen: BLOOD  Result Value Ref Range Status   Specimen  Description BLOOD RIGHT ANTECUBITAL  Final   Special Requests   Final    BOTTLES DRAWN AEROBIC AND ANAEROBIC Blood Culture adequate volume   Culture   Final    NO GROWTH < 24 HOURS Performed at [facility], [address] [place], [place] [address]    Report Status PENDING  Incomplete    Coagulation Studies: No results for input(s): LABPROT, INR in the last 72 hours.  Urinalysis: No results for input(s): COLORURINE, LABSPEC, [place], [place], [place], [place], [place], [place],  UROBILINOGEN, NITRITE, LEUKOCYTESUR in the last 72 hours.  Invalid input(s): APPERANCEUR    Imaging: Ct Head Wo Contrast  Result Date: 04/30/2019 CLINICAL DATA:  Altered mental status. EXAM: CT HEAD WITHOUT CONTRAST TECHNIQUE: Contiguous axial images were obtained from the base of the skull through the vertex without intravenous contrast. COMPARISON:  Multiple previous head CTs and MRIs of the brain. The most recent head CT is 11/13/2018 FINDINGS: Brain: Stable age advanced cerebral atrophy, ventriculomegaly and periventricular white matter disease. No extra-axial fluid collections are identified. No CT findings for acute hemispheric infarction or intracranial hemorrhage. No mass lesions. The brainstem and cerebellum are normal. Vascular: Stable vascular calcifications. No aneurysm or hyperdense vessels. Skull: No skull fracture or bone lesions. Sinuses/Orbits: The paranasal sinuses and mastoid air cells are clear. The globes are intact. Other: No scalp lesions or hematoma. IMPRESSION: Stable age advanced cerebral atrophy, ventriculomegaly and periventricular white matter disease but no acute intracranial findings or mass lesions. Electronically Signed   By: Marijo Sanes M.D.   On: 04/30/2019 15:57   Dg Chest Port 1 View  Result Date: 04/30/2019 CLINICAL DATA:  68 year old male with history of dyspnea. EXAM: PORTABLE CHEST 1 VIEW COMPARISON:  Chest x-ray 04/26/2019. FINDINGS: Chronic blunting of  the left costophrenic sulcus, similar to the prior study, which appears to correspond to a prominent epicardial fat pad based on prior chest CT 11/13/2018. No acute consolidative airspace disease. No definite pleural effusions. No evidence of pulmonary edema. No pneumothorax. Heart size is borderline enlarged. Upper mediastinal contours are within normal limits. Aortic atherosclerosis. Status post median sternotomy. IMPRESSION: 1. No radiographic evidence of acute cardiopulmonary disease. 2. Aortic atherosclerosis. Electronically Signed   By: Vinnie Langton M.D.   On: 04/30/2019 15:49     Medications:   . sodium chloride 10 mL/hr at 05/01/19 0600  . ceFEPime (MAXIPIME) IV     . acetaminophen  1,000 mg Oral Once  . aspirin EC  81 mg Oral Daily  . atorvastatin  80 mg Oral q1800  . calcium acetate (Phos Binder)  1,334 mg Oral TID WC  . Chlorhexidine Gluconate Cloth  6 each Topical Daily  . clopidogrel  75 mg Oral Daily  . epoetin (EPOGEN/PROCRIT) injection  10,000 Units Intravenous Q T,Th,Sa-HD  . feeding supplement (NEPRO CARB STEADY)  237 mL Oral BID BM  . heparin injection (subcutaneous)  5,000 Units Subcutaneous Q8H  . influenza vaccine adjuvanted  0.5 mL Intramuscular Tomorrow-1000  . insulin aspart  0-15 Units Subcutaneous TID WC  . levETIRAcetam  500 mg Oral Daily   And  . levETIRAcetam  250 mg Oral Once per day on Tue Thu Sat  . memantine  5 mg Oral Daily  . metoprolol succinate  12.5 mg Oral Daily  . mirtazapine  15 mg Oral QHS  . multivitamin  1 tablet Oral QHS   sodium chloride, acetaminophen **OR** acetaminophen, bisacodyl, labetalol, ondansetron **OR** ondansetron (ZOFRAN) IV  Assessment/ Plan:  [name] is a 68 y.o. Asian American ([place]) male with end stage renal disease on hemodialysis, history of renal transplant, hypertension, diabetes mellitus type II, peptic ulcer disease, gout, diastolic congestive heart failure. Admitted to [facility] on 04/25/2019 for Community  acquired pneumonia, unspecified laterality [J18.9] Sepsis without acute organ dysfunction, due to unspecified organism ([place]) [A41.9] Pneumonia [J18.9]  [facility] TTS 195 minutes 71.5kg Left AVG  1. End Stage Renal Disease:  Dialysis for later today. Orders prepared. Continue TTS schedule  2. Hypertension: well controlled.  Home regimen of losartan and metoprolol Usually gets midodrine before dialysis  3. Anemia of chronic kidney disease: EPO with HD treatment.   4. Secondary Hyperparathyroidism: outpatient labs at goal.  - calcium acetate with meals.  5. Lethargy:  Concern for overmedication with anti-epileptics - Neurology consultation. Recommend reduction in keppra dose   LOS: 5 [name] 11/14/202011:25 AM

## 2019-05-01 NOTE — Progress Notes (Signed)
Transferred to 216 via bed.

## 2019-05-01 NOTE — Progress Notes (Signed)
Post HD Tx Note Tx had to end 15 min earlier than prescribed time to IDH BP dropped. UF was turned off and tx stopped with 245m of NS as rinseback volume. BP restored after tx was finished. Pt reported no chest pain or SOB. Nephrologist is notified. UF of 19279mwas met. Pt run on 3K2.5Ca for 2.45hrs   05/01/19 1800  Hand-Off documentation  Report given to (Full Name) CiValinda PartyN   Report received from (Full Name) DiNewt MinionN   Vital Signs  Temp 97.7 F (36.5 C)  Temp Source Oral  Pulse Rate 70  Resp (!) 25  BP 126/69  BP Location Right Arm  BP Method Automatic  Patient Position (if appropriate) Lying  Oxygen Therapy  SpO2 99 %  O2 Device Room Air  Pain Assessment  Pain Scale 0-10  Pain Score 0  Post-Hemodialysis Assessment  Rinseback Volume (mL) 250 mL  KECN 68.3 V  Dialyzer Clearance Lightly streaked  Duration of HD Treatment -hour(s) 3 hour(s)  Hemodialysis Intake (mL) 500 mL  UF Total -Machine (mL) 1928 mL  Net UF (mL) 1428 mL  Tolerated HD Treatment Yes  AVG/AVF Arterial Site Held (minutes) 10 minutes  AVG/AVF Venous Site Held (minutes) 8 minutes  Fistula / Graft Left Forearm Arteriovenous vein graft  No Placement Date or Time found.   Placed prior to admission: Yes  Orientation: Left  Access Location: Forearm  Access Type: Arteriovenous vein graft  Site Condition No complications  Fistula / Graft Assessment Present;Thrill;Bruit  Status Deaccessed  Drainage Description None

## 2019-05-01 NOTE — Progress Notes (Signed)
   05/01/19 1445  Neurological  Level of Consciousness Alert  Orientation Level Oriented to person  Respiratory  Respiratory Pattern Regular;Unlabored  Cardiac  Pulse Regular  stable for HD TX avg +/+ ufg 3L

## 2019-05-01 NOTE — Procedures (Signed)
Patient Name: [name]  MRN: [id number]  Epilepsy Attending: Lora Havens  Referring Physician/Provider: Dr Leotis Pain Date: 04/30/2019 Duration: 2  Patient history: 68yo m with h/o seizures now with ams. EEG to evaluate for seizure  Level of alertness: awake/lethargic  AEDs during EEG study: Keppra  Technical aspects: This EEG study was done with scalp electrodes positioned according to the 10-20 International system of electrode placement. Electrical activity was acquired at a sampling rate of 500Hz  and reviewed with a high frequency filter of 70Hz  and a low frequency filter of 1Hz . EEG data were recorded continuously and digitally stored.   DESCRIPTION: During awake state, no clear posterior dominant rhythm was seen. EEG showed intermittent generalized 2-3hz  delta slowing.  Hyperventilation and photic stimulation were not performed.  ABNORMALITY - Intermittent slow, generalized  IMPRESSION: This study is suggestive of mild to moderate diffuse encephalopathy, non specific to etiology. No seizures or epileptiform discharges were seen throughout the recording.  Shayne Diguglielmo Barbra Sarks

## 2019-05-01 NOTE — Plan of Care (Signed)

## 2019-05-01 NOTE — Progress Notes (Signed)
Report called to [facility] on [facility].

## 2019-05-01 NOTE — Progress Notes (Addendum)
PROGRESS NOTE    [name]  [id number] DOB: Dec 25, 1950 DOA: 04/25/2019 PCP: Kirk Ruths, MD   Brief Narrative:  Mr. Center is a68 y.o.M with ESRD, seizures, dCHF, CAD s/p CABG, dementia, HTN and DM who presented with fever, cough. In the ER, CXR showed pneumonia. Started on antibiotics   Assessment & Plan:   Active Problems:   CAP (community acquired pneumonia)   Pneumonia   Community-acquired pneumonia, right lower lobe Sepsis from pneumonia Patient presented with fever, tachypnea, leukocytosis and encephalopathy. Afebrile ON. Pulse and RR improved. WBC normal - Continue on antibiotics to p.o. November 11 - SLP consult recommended dysphagia level 3 mechanical soft, thin liquids   Acute metabolic encephalopathy Thought to be from pneumonia.  -At baseline, patient previously reported as interactive and mostly oriented, further discussions with family 11/13 indicated he generally stays in bed and is minimally interactive - Throughout the hospital stay he has been  sleepy and confused.  - I personally discussed with family reported general he is lethargic in the morning and although he was initially reported as more awake in the evening he indicated he tends to be lethargic and remain in bed  - Also indicated prior to admission he does require 2 person assist to get into a wheelchair and is placed on the couch in the living room - Asked neurology to evaluate antiepileptics and they rec decreasing antiepileptics 11/11 which were decreased with minimal change - Patient with persistent mental status changes, stat CT of head without acute finding -Neurology saw in consultation tonight feels acute neurological event - check cxr which did not find any focal acute infection - PTstill pending,, need to evaluate patient's ability to perform simple tasks like standing and pivoting although as above family reported he has been a 2 person assist at home prior to  admission  ESRD -Consultednephrology for maintenance HD, appreciate recommendations -Midodrine before HD  Coronary disease, secondary prevention Chronic diastolic CHF Hypertension BP improved -Continueaspirin, atorvastatin -StoppedIV furosemide, manage volume with dialysis -Continuemetoprolol  Diabetes Glucosescontrolled -Continuesliding scale corrections  Dementia -Continuemirtazapine, memantine  History of seizures No seizure acitivty -Request neurology consultation for adjustment of antiepileptics and evaluation of persistent AMS -Current dosing concerning for sedating effect especially in the  DVT prophylaxis: Heparin SQ  Code Status: full-family considering transition to DNR    Code Status Orders  (From admission, onward)         Start     Ordered   04/26/19 0325  Full code  Continuous     04/26/19 0324        Code Status History    Date Active Date Inactive Code Status Order ID Comments User Context   11/13/2018 1742 11/15/2018 1602 Full Code [id number]  Sela Hua, MD Inpatient   02/19/2018 1202 02/27/2018 1941 DNR [id number]  Gorden Harms, MD Inpatient   09/16/2016 0325 09/18/2016 1709 Full Code [id number]  [place], [place], DO Inpatient   09/04/2016 2336 09/14/2016 1909 Full Code [id number]  Lance Coon, MD Inpatient   07/24/2016 0219 07/29/2016 2015 Full Code [id number]  Harvie Bridge, DO Inpatient   02/26/2016 0603 02/27/2016 1705 Full Code [id number]  Harrie Foreman, MD Inpatient   02/26/2016 0540 02/26/2016 0603 DNR [id number]  Harrie Foreman, MD Inpatient   02/14/2015 0816 02/17/2015 1347 DNR [id number]  Epifanio Lesches, MD ED   12/12/2014 2044 12/16/2014 1926 DNR [id number]  Loletha Grayer, MD ED   11/05/2014 2209 11/09/2014 2148 Full Code [id number]  Henreitta Leber,  MD Inpatient   09/30/2014 1348 10/02/2014 1902 Full Code [id number]  [name], MD Inpatient   09/29/2014 2036 09/30/2014 1348 Full Code [id number]  [name], MD  Inpatient   Advance Care Planning Activity    Advance Directive Documentation     Most Recent Value  Type of Advance Directive  Healthcare Power of Attorney  Pre-existing out of facility DNR order (yellow form or pink MOST form)  --  "MOST" Form in Place?  --     Family Communication: Discussed with family yesterday, family again indicated that they did not want to go this nursing home route did rather try to take care of the patient at home  disposition Plan:   Patient remained inpatient requiring physical therapy for gait mobility balance transfer therapy currently not safe for discharge Consults called: None Admission status: Inpatient   Consultants:   Neurology, nephrology  Procedures:  Ct Head Wo Contrast  Result Date: 04/30/2019 CLINICAL DATA:  Altered mental status. EXAM: CT HEAD WITHOUT CONTRAST TECHNIQUE: Contiguous axial images were obtained from the base of the skull through the vertex without intravenous contrast. COMPARISON:  Multiple previous head CTs and MRIs of the brain. The most recent head CT is 11/13/2018 FINDINGS: Brain: Stable age advanced cerebral atrophy, ventriculomegaly and periventricular white matter disease. No extra-axial fluid collections are identified. No CT findings for acute hemispheric infarction or intracranial hemorrhage. No mass lesions. The brainstem and cerebellum are normal. Vascular: Stable vascular calcifications. No aneurysm or hyperdense vessels. Skull: No skull fracture or bone lesions. Sinuses/Orbits: The paranasal sinuses and mastoid air cells are clear. The globes are intact. Other: No scalp lesions or hematoma. IMPRESSION: Stable age advanced cerebral atrophy, ventriculomegaly and periventricular white matter disease but no acute intracranial findings or mass lesions. Electronically Signed   By: Marijo Sanes M.D.   On: 04/30/2019 15:57   Dg Chest Port 1 View  Result Date: 04/30/2019 CLINICAL DATA:  68 year old male with history of  dyspnea. EXAM: PORTABLE CHEST 1 VIEW COMPARISON:  Chest x-ray 04/26/2019. FINDINGS: Chronic blunting of the left costophrenic sulcus, similar to the prior study, which appears to correspond to a prominent epicardial fat pad based on prior chest CT 11/13/2018. No acute consolidative airspace disease. No definite pleural effusions. No evidence of pulmonary edema. No pneumothorax. Heart size is borderline enlarged. Upper mediastinal contours are within normal limits. Aortic atherosclerosis. Status post median sternotomy. IMPRESSION: 1. No radiographic evidence of acute cardiopulmonary disease. 2. Aortic atherosclerosis. Electronically Signed   By: Vinnie Langton M.D.   On: 04/30/2019 15:49   Dg Chest Portable 1 View  Result Date: 04/26/2019 CLINICAL DATA:  Firmness of breath EXAM: PORTABLE CHEST 1 VIEW COMPARISON:  Nov 13, 2018 FINDINGS: There is mild cardiomegaly. Overlying median sternotomy wires. Prominence to the central pulmonary vasculature seen. No large airspace consolidation. There is blunting of the left costophrenic angle which could be due to a trace effusion. No acute osseous abnormality. Vascular stent seen within the left arm. IMPRESSION: Mild cardiomegaly and pulmonary vascular congestion. Probable trace left pleural effusion. Electronically Signed   By: Prudencio Pair M.D.   On: 04/26/2019 00:29     Antimicrobials:   Omnicef day 4   Subjective: Patient unchanged does open eyes slightly more interactive Peers very close to baseline We will transfer out of ICU  Objective: Vitals:   05/01/19 0900 05/01/19 0929 05/01/19 1000 05/01/19 1124  BP: (!) 174/79 (!) 174/79 (!) 158/77 (!) 179/88  Pulse: 77 77 74 73  Resp: (!) 21  19 (!) 24  Temp:    97.7 F (36.5 C)  TempSrc:    Oral  SpO2: 95%  97% 99%  Weight:      Height:        Intake/Output Summary (Last 24 hours) at 05/01/2019 1455 Last data filed at 05/01/2019 0900 Gross per 24 hour  Intake 418.14 ml  Output --  Net  418.14 ml   Filed Weights   04/27/19 0904 04/27/19 1217 04/30/19 1512  Weight: 76.3 kg 75 kg 68.7 kg    Examination:  General exam: Appears calm, only lethargic minimally changed from prior Respiratory system: Mild rhonchi bilaterally no wheezing. Cardiovascular system: S1 & S2 heard, RRR. No JVD, murmurs, rubs, gallops or clicks. No pedal edema. Gastrointestinal system: Abdomen is nondistended, soft and nontender. No organomegaly or masses felt. Normal bowel sounds heard. Central nervous system: Alert. No focal neurological deficits. Extremities: Warm well perfused, neurovascularly intact. Skin: No rashes, lesions or ulcers Psychiatry: Judgement and insight difficult to assess. Mood & affect flat.      Data Reviewed: I have personally reviewed following labs and imaging studies  CBC: Recent Labs  Lab 04/26/19 0139  04/27/19 0546 04/27/19 0825 04/28/19 0503 04/29/19 0856 05/01/19 0658  WBC 11.6*   < > 9.5 9.5 7.3 6.1 8.4  NEUTROABS 9.5*  --   --   --   --   --   --   HGB 11.2*   < > 9.4* 9.6* 10.0* 9.3* 10.6*  HCT 35.7*   < > 29.1* 29.9* 31.3* 30.1* 33.9*  MCV 73.0*   < > 72.0* 71.7* 72.5* 74.5* 73.5*  PLT 185   < > 158 127* 141* 170 167   < > = values in this interval not displayed.   Basic Metabolic Panel: Recent Labs  Lab 04/27/19 0546 04/28/19 0503 04/29/19 0856 04/30/19 1446 05/01/19 0658  NA 139 139 138 138 139  K 4.6 4.4 4.2 4.2 4.9  CL 101 99 100 97* 101  CO2 25 27 25 26 25   GLUCOSE 101* 89 94 142* 103*  BUN 63* 32* 46* 29* 34*  CREATININE 12.66* 8.37* 11.14* 8.80* 10.60*  CALCIUM 8.5* 8.6* 8.2* 8.8* 8.6*  PHOS 3.0  --  3.7  --   --    GFR: Estimated Creatinine Clearance: 6.6 mL/min (A) (by C-G formula based on SCr of 10.6 mg/dL (H)). Liver Function Tests: Recent Labs  Lab 04/26/19 0139 04/27/19 0546 04/29/19 0856  AST 18  --   --   ALT 13  --   --   ALKPHOS 84  --   --   BILITOT 0.7  --   --   PROT 7.8  --   --   ALBUMIN 3.6 3.1* 2.9*    No results for input(s): LIPASE, AMYLASE in the last 168 hours. Recent Labs  Lab 04/30/19 1446  AMMONIA 16   Coagulation Profile: No results for input(s): INR, PROTIME in the last 168 hours. Cardiac Enzymes: No results for input(s): CKTOTAL, CKMB, CKMBINDEX, TROPONINI in the last 168 hours. BNP (last 3 results) No results for input(s): PROBNP in the last 8760 hours. HbA1C: No results for input(s): HGBA1C in the last 72 hours. CBG: Recent Labs  Lab 04/30/19 1444 04/30/19 1514 04/30/19 2158 05/01/19 0729 05/01/19 1250  GLUCAP 152* 130* 100* 109* 124*   Lipid Profile: No results for input(s): CHOL, HDL, LDLCALC, TRIG, CHOLHDL, LDLDIRECT in the last 72 hours. Thyroid Function Tests: No  results for input(s): TSH, T4TOTAL, FREET4, T3FREE, THYROIDAB in the last 72 hours. Anemia Panel: No results for input(s): VITAMINB12, FOLATE, FERRITIN, TIBC, IRON, RETICCTPCT in the last 72 hours. Sepsis Labs: Recent Labs  Lab 04/26/19 0139 04/30/19 1446 05/01/19 0658  PROCALCITON 0.21 2.32 1.94  LATICACIDVEN 1.5  --   --     Recent Results (from the past 240 hour(s))  SARS CORONAVIRUS 2 (TAT 6-24 HRS) Nasopharyngeal Nasopharyngeal Swab     Status: None   Collection Time: 04/26/19 12:26 AM   Specimen: Nasopharyngeal Swab  Result Value Ref Range Status   SARS Coronavirus 2 NEGATIVE NEGATIVE Final    Comment: (NOTE) SARS-CoV-2 target nucleic acids are NOT DETECTED. The SARS-CoV-2 RNA is generally detectable in upper and lower respiratory specimens during the acute phase of infection. Negative results do not preclude SARS-CoV-2 infection, do not rule out co-infections with other pathogens, and should not be used as the sole basis for treatment or other patient management decisions. Negative results must be combined with clinical observations, patient history, and epidemiological information. The expected result is Negative. Fact Sheet for  Patients: SugarRoll.be Fact Sheet for Healthcare Providers: https://www.woods-mathews.com/ This test is not yet approved or cleared by the [place] FDA and  has been authorized for detection and/or diagnosis of SARS-CoV-2 by FDA under an Emergency Use Authorization (EUA). This EUA will remain  in effect (meaning this test can be used) for the duration of the COVID-19 declaration under Section 56 4(b)(1) of the Act, 21 U.S.C. section 360bbb-3(b)(1), unless the authorization is terminated or revoked sooner. Performed at [facility], [place] [address], [place], [place] [address]   Blood culture (routine x 2)     Status: None   Collection Time: 04/26/19  1:39 AM   Specimen: BLOOD  Result Value Ref Range Status   Specimen Description BLOOD RIGHT HAND  Final   Special Requests   Final    BOTTLES DRAWN AEROBIC AND ANAEROBIC Blood Culture results may not be optimal due to an inadequate volume of blood received in culture bottles   Culture   Final    NO GROWTH 5 DAYS Performed at [facility], [address] [place], [place] [address]    Report Status 05/01/2019 FINAL  Final  Blood culture (routine x 2)     Status: None   Collection Time: 04/26/19  2:25 AM   Specimen: BLOOD  Result Value Ref Range Status   Specimen Description BLOOD RIGHT HAND  Final   Special Requests   Final    BOTTLES DRAWN AEROBIC AND ANAEROBIC Blood Culture adequate volume   Culture   Final    NO GROWTH 5 DAYS Performed at [facility], [address] [place], [place] [address]    Report Status 05/01/2019 FINAL  Final  MRSA PCR Screening     Status: None   Collection Time: 04/30/19  3:14 PM   Specimen: Nasal Mucosa; Nasopharyngeal  Result Value Ref Range Status   MRSA by PCR NEGATIVE NEGATIVE Final    Comment:        The GeneXpert MRSA Assay (FDA approved for NASAL specimens only), is one component of a comprehensive MRSA  colonization surveillance program. It is not intended to diagnose MRSA infection nor to guide or monitor treatment for MRSA infections. Performed at [facility], [place], [place] [address]   CULTURE, BLOOD (ROUTINE X 2) w Reflex to ID Panel     Status: None (Preliminary  result)   Collection Time: 04/30/19  4:07 PM   Specimen: BLOOD  Result Value Ref Range Status   Specimen Description BLOOD RIGHT ANTECUBITAL  Final   Special Requests   Final    BOTTLES DRAWN AEROBIC AND ANAEROBIC Blood Culture adequate volume   Culture   Final    NO GROWTH < 24 HOURS Performed at [facility], [address] [place], [place] [address]    Report Status PENDING  Incomplete         Radiology Studies: Ct Head Wo Contrast  Result Date: 04/30/2019 CLINICAL DATA:  Altered mental status. EXAM: CT HEAD WITHOUT CONTRAST TECHNIQUE: Contiguous axial images were obtained from the base of the skull through the vertex without intravenous contrast. COMPARISON:  Multiple previous head CTs and MRIs of the brain. The most recent head CT is 11/13/2018 FINDINGS: Brain: Stable age advanced cerebral atrophy, ventriculomegaly and periventricular white matter disease. No extra-axial fluid collections are identified. No CT findings for acute hemispheric infarction or intracranial hemorrhage. No mass lesions. The brainstem and cerebellum are normal. Vascular: Stable vascular calcifications. No aneurysm or hyperdense vessels. Skull: No skull fracture or bone lesions. Sinuses/Orbits: The paranasal sinuses and mastoid air cells are clear. The globes are intact. Other: No scalp lesions or hematoma. IMPRESSION: Stable age advanced cerebral atrophy, ventriculomegaly and periventricular white matter disease but no acute intracranial findings or mass lesions. Electronically Signed   By: Marijo Sanes M.D.   On: 04/30/2019 15:57   Dg Chest Port 1 View  Result Date: 04/30/2019 CLINICAL DATA:   68 year old male with history of dyspnea. EXAM: PORTABLE CHEST 1 VIEW COMPARISON:  Chest x-ray 04/26/2019. FINDINGS: Chronic blunting of the left costophrenic sulcus, similar to the prior study, which appears to correspond to a prominent epicardial fat pad based on prior chest CT 11/13/2018. No acute consolidative airspace disease. No definite pleural effusions. No evidence of pulmonary edema. No pneumothorax. Heart size is borderline enlarged. Upper mediastinal contours are within normal limits. Aortic atherosclerosis. Status post median sternotomy. IMPRESSION: 1. No radiographic evidence of acute cardiopulmonary disease. 2. Aortic atherosclerosis. Electronically Signed   By: Vinnie Langton M.D.   On: 04/30/2019 15:49        Scheduled Meds:  acetaminophen  1,000 mg Oral Once   aspirin EC  81 mg Oral Daily   atorvastatin  80 mg Oral q1800   calcium acetate (Phos Binder)  1,334 mg Oral TID WC   Chlorhexidine Gluconate Cloth  6 each Topical Daily   clopidogrel  75 mg Oral Daily   epoetin (EPOGEN/PROCRIT) injection  10,000 Units Intravenous Q T,Th,Sa-HD   feeding supplement (NEPRO CARB STEADY)  237 mL Oral BID BM   heparin injection (subcutaneous)  5,000 Units Subcutaneous Q8H   influenza vaccine adjuvanted  0.5 mL Intramuscular Tomorrow-1000   insulin aspart  0-15 Units Subcutaneous TID WC   levETIRAcetam  500 mg Oral Daily   And   levETIRAcetam  250 mg Oral Once per day on Tue Thu Sat   memantine  5 mg Oral Daily   metoprolol succinate  12.5 mg Oral Daily   mirtazapine  15 mg Oral QHS   multivitamin  1 tablet Oral QHS   Continuous Infusions:  sodium chloride 10 mL/hr at 05/01/19 0600   ceFEPime (MAXIPIME) IV       LOS: 5 days    Time spent: 35 min     Nicolette Bang, MD [facility]  If 7PM-7AM, please contact night-coverage  05/01/2019, 2:55  PM

## 2019-05-01 NOTE — Progress Notes (Signed)
Post HD Assessment   05/01/19 1755  Neurological  Level of Consciousness Alert  Orientation Level Oriented to person  Respiratory  Respiratory Pattern Regular;Unlabored  Chest Assessment Chest expansion symmetrical  Bilateral Breath Sounds Diminished  Cardiac  Pulse Regular  Heart Sounds S3  Jugular Venous Distention (JVD) Yes  ECG Monitor Yes  Cardiac Rhythm NSR;Heart block  Heart Block Type 1st degree AVB  Ectopy Unifocal PVC's  Ectopy Frequency Frequent  Antiarrhythmic device No  Vascular  R Radial Pulse +2  L Radial Pulse +2  Edema Generalized  Generalized Edema +1  Integumentary  Integumentary (WDL) X  Skin Color Appropriate for ethnicity  Skin Condition Dry  Skin Integrity Ecchymosis  Ecchymosis Location Arm  Ecchymosis Location Orientation Right;Left  Musculoskeletal  Musculoskeletal (WDL) X  Generalized Weakness Yes  Gastrointestinal  Bowel Sounds Assessment Active  Last BM Date 04/29/19  GU Assessment  Genitourinary (WDL) X  Genitourinary Symptoms Anuria (HD Pt)  Psychosocial  Psychosocial (WDL) X  Patient Behaviors Sad;Flat affect  Needs Expressed Emotional  Emotional support given Given to patient

## 2019-05-02 DIAGNOSIS — E1029 Type 1 diabetes mellitus with other diabetic kidney complication: Secondary | ICD-10-CM

## 2019-05-02 DIAGNOSIS — F0390 Unspecified dementia without behavioral disturbance: Secondary | ICD-10-CM

## 2019-05-02 LAB — PROCALCITONIN: Procalcitonin: 2.34 ng/mL

## 2019-05-02 LAB — GLUCOSE, CAPILLARY
Glucose-Capillary: 104 mg/dL — ABNORMAL HIGH (ref 70–99)
Glucose-Capillary: 129 mg/dL — ABNORMAL HIGH (ref 70–99)
Glucose-Capillary: 99 mg/dL (ref 70–99)

## 2019-05-02 MED ORDER — LEVETIRACETAM 100 MG/ML PO SOLN: 500.0000 mg | Freq: Every day | ORAL | 12 refills | Status: AC

## 2019-05-02 MED ORDER — LEVETIRACETAM 100 MG/ML PO SOLN: 250.0000 mg | ORAL | 12 refills | Status: DC

## 2019-05-02 MED ORDER — LEVETIRACETAM 100 MG/ML PO SOLN: 500.0000 mg | Freq: Every day | ORAL | 12 refills | Status: DC

## 2019-05-02 NOTE — Progress Notes (Signed)
Karsten Ro to be D/C'd home with son per MD order.  Discussed prescriptions and follow up appointments with the patient. Prescriptions given to patient, medication list explained in detail. Pt verbalized understanding.  Allergies as of 05/02/2019      Reactions   Ivp Dye [iodinated Diagnostic Agents] Other (See Comments)   Pt denied   Metrizamide Other (See Comments)   Contains iodine      Medication List    STOP taking these medications   levETIRAcetam 250 MG tablet Commonly known as: KEPPRA Replaced by: levETIRAcetam 100 MG/ML solution     TAKE these medications   allopurinol 100 MG tablet Commonly known as: ZYLOPRIM Take 100 mg by mouth daily.   aspirin 81 MG EC tablet Take 81 mg by mouth daily.   atorvastatin 80 MG tablet Commonly known as: LIPITOR Take 80 mg by mouth daily.   calcium acetate 667 MG capsule Commonly known as: PHOSLO Take 1,334 mg by mouth 3 (three) times daily with meals.   clopidogrel 75 MG tablet Commonly known as: PLAVIX Take 1 tablet (75 mg total) by mouth daily with breakfast.   levETIRAcetam 100 MG/ML solution Commonly known as: KEPPRA Take 5 mLs (500 mg total) by mouth daily. Start taking on: May 03, 2019 Replaces: levETIRAcetam 250 MG tablet   levETIRAcetam 100 MG/ML solution Commonly known as: KEPPRA Take 2.5 mLs (250 mg total) by mouth Every Tuesday,Thursday,and Saturday with dialysis. Start taking on: May 04, 2019   losartan 25 MG tablet Commonly known as: COZAAR Take 1 tablet (25 mg total) by mouth daily.   memantine 5 MG tablet Commonly known as: NAMENDA Take 10 mg by mouth daily.   metoprolol succinate 25 MG 24 hr tablet Commonly known as: TOPROL-XL Take 12.5-25 mg by mouth See admin instructions. Take 25 mg by mouth on non-dialysis days and 12.5 mg on dialysis days (Tuesday, Thursday and Saturday) after dialysis.   mirtazapine 15 MG tablet Commonly known as: REMERON Take 15 mg by mouth at bedtime.    nitroGLYCERIN 0.4 MG SL tablet Commonly known as: NITROSTAT Place 1 tablet (0.4 mg total) under the tongue every 5 (five) minutes x 3 doses as needed for chest pain.            Durable Medical Equipment  (From admission, onward)         Start     Ordered   05/02/19 1326  For home use only DME lightweight manual wheelchair with seat cushion  Once    Comments: Patient suffers from end stage renal diease, dementia which impairs their ability to perform daily activities like dressing, bathing and toileting in the home.  A cane, walker will not resolve  issue with performing activities of daily living. A wheelchair will allow patient to safely perform daily activities. Patient is not able to propel themselves in the home using a standard weight wheelchair due to weakness. Patient can self propel in the lightweight wheelchair. Length of need lifetime. Accessories: elevating leg rests (ELRs), wheel locks, extensions and anti-tippers. Seat and back cushion.   05/02/19 1326          Vitals:   05/02/19 0956 05/02/19 1237  BP: 130/74 132/73  Pulse: 79 86  Resp:  (!) 28  Temp:  98.7 F (37.1 C)  SpO2:  92%    Skin clean, dry and intact without evidence of skin break down, no evidence of skin tears noted. IV catheter discontinued intact. Site without signs and symptoms of complications.  Dressing and pressure applied. Pt denies pain at this time. No complaints noted.  An After Visit Summary was printed and given to the patient. Patient escorted via [place], and D/C home via private auto.  [place] A Olive Zmuda

## 2019-05-02 NOTE — Progress Notes (Signed)
Patient suffers from dementia and end stage renal disease which impairs their ability to perform activities of daily living in the home such as dressing, bathing, and toileting. A cane, walker or crutches will not resolve issue with performing activities of daily living. A wheelchair will allow patient to perform activities of daily living. Patient is not able to self propel themselves with a standard wheelchair due to weakness. Patient can self propel the light weight wheelchair.

## 2019-05-02 NOTE — Progress Notes (Signed)
[facility]  ROUNDING NOTE   Subjective:   Hemodialysis treatment yesterday. Tolerated treatment well. UF of 1434mL.   EEG negative Patient continues to be lethargic and slow to respond.  Family meeting with hospitalist yesterday  Objective:  Vital signs in last 24 hours:  Temp:  [97.7 F (36.5 C)-99.1 F (37.3 C)] 98.7 F (37.1 C) (11/15 1237) Pulse Rate:  [66-86] 86 (11/15 1237) Resp:  [19-29] 28 (11/15 1237) BP: (58-137)/(46-79) 132/73 (11/15 1237) SpO2:  [92 %-100 %] 92 % (11/15 1237)  Weight change:  Filed Weights   04/27/19 0904 04/27/19 1217 04/30/19 1512  Weight: 76.3 kg 75 kg 68.7 kg    Intake/Output: I/O last 3 completed shifts: In: 318.1 [P.O.:100; I.V.:118.1; IV Piggyback:100] Out: 1428 [Other:1428]   Intake/Output this shift:  No intake/output data recorded.  Physical Exam: General: NAD, laying in bed  Head: Normocephalic, atraumatic. Moist oral mucosal membranes  Eyes: Anicteric, PERRL  Neck: Supple, trachea midline  Lungs:  Diminished bilaterally  Heart: Regular rate and rhythm  Abdomen:  Soft, nontender,   Extremities:  + peripheral edema.  Neurologic: Lethargic, appropriately answers questions  Skin: No lesions  Access: Left AVG    Basic Metabolic Panel: Recent Labs  Lab 04/27/19 0546 04/28/19 0503 04/29/19 0856 04/30/19 1446 05/01/19 0658  NA 139 139 138 138 139  K 4.6 4.4 4.2 4.2 4.9  CL 101 99 100 97* 101  CO2 25 27 25 26 25   GLUCOSE 101* 89 94 142* 103*  BUN 63* 32* 46* 29* 34*  CREATININE 12.66* 8.37* 11.14* 8.80* 10.60*  CALCIUM 8.5* 8.6* 8.2* 8.8* 8.6*  PHOS 3.0  --  3.7  --   --     Liver Function Tests: Recent Labs  Lab 04/26/19 0139 04/27/19 0546 04/29/19 0856  AST 18  --   --   ALT 13  --   --   ALKPHOS 84  --   --   BILITOT 0.7  --   --   PROT 7.8  --   --   ALBUMIN 3.6 3.1* 2.9*   No results for input(s): LIPASE, AMYLASE in the last 168 hours. Recent Labs  Lab 04/30/19 1446  AMMONIA 16     CBC: Recent Labs  Lab 04/26/19 0139  04/27/19 0546 04/27/19 0825 04/28/19 0503 04/29/19 0856 05/01/19 0658  WBC 11.6*   < > 9.5 9.5 7.3 6.1 8.4  NEUTROABS 9.5*  --   --   --   --   --   --   HGB 11.2*   < > 9.4* 9.6* 10.0* 9.3* 10.6*  HCT 35.7*   < > 29.1* 29.9* 31.3* 30.1* 33.9*  MCV 73.0*   < > 72.0* 71.7* 72.5* 74.5* 73.5*  PLT 185   < > 158 127* 141* 170 167   < > = values in this interval not displayed.    Cardiac Enzymes: No results for input(s): CKTOTAL, CKMB, CKMBINDEX, TROPONINI in the last 168 hours.  BNP: Invalid input(s): POCBNP  CBG: Recent Labs  Lab 05/01/19 1250 05/01/19 1826 05/01/19 2132 05/02/19 0736 05/02/19 1144  GLUCAP 124* 113* 157* 104* 20*    Microbiology: Results for orders placed or performed during the hospital encounter of 04/25/19  SARS CORONAVIRUS 2 (TAT 6-24 HRS) Nasopharyngeal Nasopharyngeal Swab     Status: None   Collection Time: 04/26/19 12:26 AM   Specimen: Nasopharyngeal Swab  Result Value Ref Range Status   SARS Coronavirus 2 NEGATIVE NEGATIVE Final  Comment: (NOTE) SARS-CoV-2 target nucleic acids are NOT DETECTED. The SARS-CoV-2 RNA is generally detectable in upper and lower respiratory specimens during the acute phase of infection. Negative results do not preclude SARS-CoV-2 infection, do not rule out co-infections with other pathogens, and should not be used as the sole basis for treatment or other patient management decisions. Negative results must be combined with clinical observations, patient history, and epidemiological information. The expected result is Negative. Fact Sheet for Patients: SugarRoll.be Fact Sheet for Healthcare Providers: https://www.woods-mathews.com/ This test is not yet approved or cleared by the [place] FDA and  has been authorized for detection and/or diagnosis of SARS-CoV-2 by FDA under an Emergency Use Authorization (EUA). This EUA will  remain  in effect (meaning this test can be used) for the duration of the COVID-19 declaration under Section 56 4(b)(1) of the Act, 21 U.S.C. section 360bbb-3(b)(1), unless the authorization is terminated or revoked sooner. Performed at [facility], [place] [address] [place], [place] [address]   Blood culture (routine x 2)     Status: None   Collection Time: 04/26/19  1:39 AM   Specimen: BLOOD  Result Value Ref Range Status   Specimen Description BLOOD RIGHT HAND  Final   Special Requests   Final    BOTTLES DRAWN AEROBIC AND ANAEROBIC Blood Culture results may not be optimal due to an inadequate volume of blood received in culture bottles   Culture   Final    NO GROWTH 5 DAYS Performed at [facility], [address] [place], [place] [address]    Report Status 05/01/2019 FINAL  Final  Blood culture (routine x 2)     Status: None   Collection Time: 04/26/19  2:25 AM   Specimen: BLOOD  Result Value Ref Range Status   Specimen Description BLOOD RIGHT HAND  Final   Special Requests   Final    BOTTLES DRAWN AEROBIC AND ANAEROBIC Blood Culture adequate volume   Culture   Final    NO GROWTH 5 DAYS Performed at [facility], [address], [place], [place] [address]    Report Status 05/01/2019 FINAL  Final  MRSA PCR Screening     Status: None   Collection Time: 04/30/19  3:14 PM   Specimen: Nasal Mucosa; Nasopharyngeal  Result Value Ref Range Status   MRSA by PCR NEGATIVE NEGATIVE Final    Comment:        The GeneXpert MRSA Assay (FDA approved for NASAL specimens only), is one component of a comprehensive MRSA colonization surveillance program. It is not intended to diagnose MRSA infection nor to guide or monitor treatment for MRSA infections. Performed at [facility], [address] [place], [place] [address]   CULTURE, BLOOD (ROUTINE X 2) w Reflex to ID Panel     Status: None (Preliminary result)   Collection Time: 04/30/19  4:07 PM    Specimen: BLOOD  Result Value Ref Range Status   Specimen Description BLOOD RIGHT ANTECUBITAL  Final   Special Requests   Final    BOTTLES DRAWN AEROBIC AND ANAEROBIC Blood Culture adequate volume   Culture   Final    NO GROWTH < 24 HOURS Performed at [facility], [address] [place], [place] [address]    Report Status PENDING  Incomplete    Coagulation Studies: No results for input(s): LABPROT, INR in the last 72 hours.  Urinalysis: No results for input(s): COLORURINE, LABSPEC, [place], [place], [place], [place], [place], [place],  UROBILINOGEN, NITRITE, LEUKOCYTESUR in the last 72 hours.  Invalid input(s): APPERANCEUR    Imaging: Ct Head Wo Contrast  Result Date: 04/30/2019 CLINICAL DATA:  Altered mental status. EXAM: CT HEAD WITHOUT CONTRAST TECHNIQUE: Contiguous axial images were obtained from the base of the skull through the vertex without intravenous contrast. COMPARISON:  Multiple previous head CTs and MRIs of the brain. The most recent head CT is 11/13/2018 FINDINGS: Brain: Stable age advanced cerebral atrophy, ventriculomegaly and periventricular white matter disease. No extra-axial fluid collections are identified. No CT findings for acute hemispheric infarction or intracranial hemorrhage. No mass lesions. The brainstem and cerebellum are normal. Vascular: Stable vascular calcifications. No aneurysm or hyperdense vessels. Skull: No skull fracture or bone lesions. Sinuses/Orbits: The paranasal sinuses and mastoid air cells are clear. The globes are intact. Other: No scalp lesions or hematoma. IMPRESSION: Stable age advanced cerebral atrophy, ventriculomegaly and periventricular white matter disease but no acute intracranial findings or mass lesions. Electronically Signed   By: Marijo Sanes M.D.   On: 04/30/2019 15:57   Dg Chest Port 1 View  Result Date: 04/30/2019 CLINICAL DATA:  68 year old male with history of dyspnea. EXAM: PORTABLE CHEST 1 VIEW  COMPARISON:  Chest x-ray 04/26/2019. FINDINGS: Chronic blunting of the left costophrenic sulcus, similar to the prior study, which appears to correspond to a prominent epicardial fat pad based on prior chest CT 11/13/2018. No acute consolidative airspace disease. No definite pleural effusions. No evidence of pulmonary edema. No pneumothorax. Heart size is borderline enlarged. Upper mediastinal contours are within normal limits. Aortic atherosclerosis. Status post median sternotomy. IMPRESSION: 1. No radiographic evidence of acute cardiopulmonary disease. 2. Aortic atherosclerosis. Electronically Signed   By: Vinnie Langton M.D.   On: 04/30/2019 15:49     Medications:   . sodium chloride 10 mL/hr at 05/01/19 0600  . ceFEPime (MAXIPIME) IV     . acetaminophen  1,000 mg Oral Once  . aspirin EC  81 mg Oral Daily  . atorvastatin  80 mg Oral q1800  . calcium acetate (Phos Binder)  1,334 mg Oral TID WC  . Chlorhexidine Gluconate Cloth  6 each Topical Daily  . clopidogrel  75 mg Oral Daily  . epoetin (EPOGEN/PROCRIT) injection  10,000 Units Intravenous Q T,Th,Sa-HD  . feeding supplement (NEPRO CARB STEADY)  237 mL Oral BID BM  . heparin injection (subcutaneous)  5,000 Units Subcutaneous Q8H  . influenza vaccine adjuvanted  0.5 mL Intramuscular Tomorrow-1000  . insulin aspart  0-15 Units Subcutaneous TID WC  . levETIRAcetam  500 mg Oral Daily   And  . levETIRAcetam  250 mg Oral Once per day on Tue Thu Sat  . memantine  5 mg Oral Daily  . metoprolol succinate  12.5 mg Oral Daily  . mirtazapine  15 mg Oral QHS  . multivitamin  1 tablet Oral QHS   sodium chloride, acetaminophen **OR** acetaminophen, bisacodyl, labetalol, ondansetron **OR** ondansetron (ZOFRAN) IV  Assessment/ Plan:  [name] is a 68 y.o. Asian American ([place]) male with end stage renal disease on hemodialysis, history of renal transplant, hypertension, diabetes mellitus type II, peptic ulcer disease, gout, diastolic  congestive heart failure. Admitted to [facility] on 04/25/2019 for Community acquired pneumonia, unspecified laterality [J18.9] Sepsis without acute organ dysfunction, due to unspecified organism ([place]) [A41.9] Pneumonia [J18.9]  [facility] [address] TTS 195 minutes 71.5kg Left AVG  1. End Stage Renal Disease:  Continue TTS schedule  2. Hypertension: well controlled. Home regimen of losartan and metoprolol  Usually gets midodrine before dialysis  3. Anemia of chronic kidney disease: EPO with HD treatment.   4. Secondary Hyperparathyroidism: outpatient labs at goal.  - calcium acetate with meals.  5. Lethargy:  Concern for overmedication with anti-epileptics. Keppra dose reduced. EEG completed.  - Appreciate neurology input.    LOS: 6 Lynde Ludwig 11/15/202012:45 PM

## 2019-05-02 NOTE — Plan of Care (Signed)
Patient is very lethargic but does respond to my voice and follows commands. He also has a poor appetite and needs encouragement to take po meds. Will continue to monitor.  Christene Slates  05/02/2019  3:17 AM

## 2019-05-02 NOTE — Discharge Summary (Addendum)
Physician Discharge Summary  [name] [id number] DOB: 07-06-1950 DOA: 04/25/2019  PCP: David Ruths, MD  Admit date: 04/25/2019 Discharge date: 05/02/2019  Admitted From: Inpatient Disposition: home  Recommendations for Outpatient Follow-up:  1. Follow up with PCP in 1-2 weeks   Home Health:Yes Equipment/Devices:no new equip  Discharge Condition:Fair CODE STATUS:DNR Diet recommendation: Renal diet  Brief/Interim Summary: [name] is a68 y.o.M with ESRD, seizures, dCHF, CAD s/p CABG, dementia, HTN and DM who presented with fever, cough. In the ER, CXR showed pneumonia. Started on antibiotics  Hospital course: Patient presented as above with right lower lobe pneumonia and sepsis.  He was placed on antibiotics.  Tolerated without complication.  Completed course of abx, wbc nl, cxr clear. No indication for additional abx.  Patient is also known to have acute versus chronic metabolic encephalopathy.  There were multiple conversations with family who indicated that patient has been lethargic for an extended period of time approximately 4 years.  They indicated he requires 2-3 person assist out of bed to wheelchair and then 2-3 person assist in the couch in the living room.  He is minimally interactive does not participate with family.  They did report that patient tends to wake up more in the afternoon per them.  I asked neurology to evaluate the patient and adjusted antiepileptics.  No acute neurological events were identified.  This is likely consistent with progressive dementia with minimal interaction with family flat affect. Several staff members had extensive discussions with family regarding placement in skilled nursing facility but they have declined,   PATIENT IS HIGH RISK OF READMISSION WITH SIGNIFICANT NEEDS AT HOME.  FAMILY CONTINUES TO DECLINE SNF.  End-stage renal disease on hemodialysis patient was seen by nephrology in consultation, he will continue dialysis  as an outpatient, tolerated dialysis without complication  While in the hospital continue patient's home medications for hypertension, diabetes and dementia.  Will refer to primary care physician for continued adjustments.  Finally as above patient's history of seizure disorders he was seen by neurology with adjustments in his Keppra.  At discharge patient is medically stable although with significant home needs would benefit from skilled nursing facility as noted above family has declined and elected to take the patient home.  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 ([name])   CAP (community acquired pneumonia)   ESRD on hemodialysis ([name])   Pneumonia   Dementia without behavioral disturbance ([name])   Seizure ([name])    Discharge Instructions  Discharge Instructions    Call MD for:   Complete by: As directed    Any acute change in medical condition   Call MD for:  difficulty breathing, headache or visual disturbances   Complete by: As directed    Call MD for:  extreme fatigue   Complete by: As directed    Call MD for:  persistant dizziness or light-headedness   Complete by: As directed    Call MD for:  persistant nausea and vomiting   Complete by: As directed    Call MD for:  temperature >100.4   Complete by: As directed    Diet - low sodium heart healthy   Complete by: As directed    Increase activity slowly   Complete by: As directed      Allergies  as of 05/02/2019      Reactions   Ivp Dye [iodinated Diagnostic Agents] Other (See Comments)   Pt denied   Metrizamide Other (See Comments)   Contains iodine      Medication List    STOP taking these medications   [name] 250 MG tablet Commonly known as: KEPPRA Replaced by: [name] 100 MG/ML  solution     TAKE these medications   allopurinol 100 MG tablet Commonly known as: ZYLOPRIM Take 100 mg by mouth daily.   aspirin 81 MG EC tablet Take 81 mg by mouth daily.   atorvastatin 80 MG tablet Commonly known as: LIPITOR Take 80 mg by mouth daily.   calcium acetate 667 MG capsule Commonly known as: PHOSLO Take 1,334 mg by mouth 3 (three) times daily with meals.   clopidogrel 75 MG tablet Commonly known as: PLAVIX Take 1 tablet (75 mg total) by mouth daily with breakfast.   [name] 100 MG/ML solution Commonly known as: KEPPRA Take 2.5 mLs (250 mg total) by mouth Every Tuesday,Thursday,and Saturday with dialysis.   [name] 100 MG/ML solution Commonly known as: KEPPRA Take 5 mLs (500 mg total) by mouth daily. Start taking on: May 03, 2019 Replaces: [name] 250 MG tablet   losartan 25 MG tablet Commonly known as: COZAAR Take 1 tablet (25 mg total) by mouth daily.   memantine 5 MG tablet Commonly known as: NAMENDA Take 10 mg by mouth daily.   metoprolol succinate 25 MG 24 hr tablet Commonly known as: TOPROL-XL Take 12.5-25 mg by mouth See admin instructions. Take 25 mg by mouth on non-dialysis days and 12.5 mg on dialysis days (Tuesday, Thursday and Saturday) after dialysis.   mirtazapine 15 MG tablet Commonly known as: REMERON Take 15 mg by mouth at bedtime.   nitroGLYCERIN 0.4 MG SL tablet Commonly known as: NITROSTAT Place 1 tablet (0.4 mg total) under the tongue every 5 (five) minutes x 3 doses as needed for chest pain.       Allergies  Allergen Reactions  . Ivp Dye [Iodinated Diagnostic Agents] Other (See Comments)    Pt denied  . Metrizamide Other (See Comments)    Contains iodine    Consultations:  NEUROLOGY, NEPHROLOGY   Procedures/Studies: Ct Head Wo Contrast  Result Date: 04/30/2019 CLINICAL DATA:  Altered mental status. EXAM: CT HEAD WITHOUT CONTRAST TECHNIQUE: Contiguous axial images were obtained from the  base of the skull through the vertex without intravenous contrast. COMPARISON:  Multiple previous head CTs and MRIs of the brain. The most recent head CT is 11/13/2018 FINDINGS: Brain: Stable age advanced cerebral atrophy, ventriculomegaly and periventricular white matter disease. No extra-axial fluid collections are identified. No CT findings for acute hemispheric infarction or intracranial hemorrhage. No mass lesions. The brainstem and cerebellum are normal. Vascular: Stable vascular calcifications. No aneurysm or hyperdense vessels. Skull: No skull fracture or bone lesions. Sinuses/Orbits: The paranasal sinuses and mastoid air cells are clear. The globes are intact. Other: No scalp lesions or hematoma. IMPRESSION: Stable age advanced cerebral atrophy, ventriculomegaly and periventricular white matter disease but no acute intracranial findings or mass lesions. Electronically Signed   By: Marijo Sanes M.D.   On: 04/30/2019 15:57   Dg Chest Port 1 View  Result Date: 04/30/2019 CLINICAL DATA:  68 year old male with history of dyspnea. EXAM: PORTABLE CHEST 1 VIEW COMPARISON:  Chest x-ray 04/26/2019. FINDINGS: Chronic blunting of the left costophrenic sulcus, similar to the prior study, which appears to correspond to a prominent epicardial fat pad  based on prior chest CT 11/13/2018. No acute consolidative airspace disease. No definite pleural effusions. No evidence of pulmonary edema. No pneumothorax. Heart size is borderline enlarged. Upper mediastinal contours are within normal limits. Aortic atherosclerosis. Status post median sternotomy. IMPRESSION: 1. No radiographic evidence of acute cardiopulmonary disease. 2. Aortic atherosclerosis. Electronically Signed   By: Vinnie Langton M.D.   On: 04/30/2019 15:49   Dg Chest Portable 1 View  Result Date: 04/26/2019 CLINICAL DATA:  Firmness of breath EXAM: PORTABLE CHEST 1 VIEW COMPARISON:  Nov 13, 2018 FINDINGS: There is mild cardiomegaly. Overlying median  sternotomy wires. Prominence to the central pulmonary vasculature seen. No large airspace consolidation. There is blunting of the left costophrenic angle which could be due to a trace effusion. No acute osseous abnormality. Vascular stent seen within the left arm. IMPRESSION: Mild cardiomegaly and pulmonary vascular congestion. Probable trace left pleural effusion. Electronically Signed   By: Prudencio Pair M.D.   On: 04/26/2019 00:29       Subjective: No acute change patient remained stable overnight   Discharge Exam: Vitals:   05/02/19 0956 05/02/19 1237  BP: 130/74 132/73  Pulse: 79 86  Resp:  (!) 28  Temp:  98.7 F (37.1 C)  SpO2:  92%   Vitals:   05/01/19 1828 05/02/19 0421 05/02/19 0956 05/02/19 1237  BP: 126/69 130/68 130/74 132/73  Pulse: 78 82 79 86  Resp: 20 20  (!) 28  Temp: 97.8 F (36.6 C) 99.1 F (37.3 C)  98.7 F (37.1 C)  TempSrc: Oral Oral  Oral  SpO2: 100% 95%  92%  Weight:      Height:        General: Pt is alert, awake, not in acute distress but remains withdrawn and minimally interactive Cardiovascular: RRR, S1/S2 +, no rubs, no gallops Respiratory: CTA bilaterally, no wheezing, no rhonchi Abdominal: Soft, NT, ND, bowel sounds + Extremities: no edema, no cyanosis    The results of significant diagnostics from this hospitalization (including imaging, microbiology, ancillary and laboratory) are listed below for reference.     Microbiology: Recent Results (from the past 240 hour(s))  SARS CORONAVIRUS 2 (TAT 6-24 HRS) Nasopharyngeal Nasopharyngeal Swab     Status: None   Collection Time: 04/26/19 12:26 AM   Specimen: Nasopharyngeal Swab  Result Value Ref Range Status   SARS Coronavirus 2 NEGATIVE NEGATIVE Final    Comment: (NOTE) SARS-CoV-2 target nucleic acids are NOT DETECTED. The SARS-CoV-2 RNA is generally detectable in upper and lower respiratory specimens during the acute phase of infection. Negative results do not preclude SARS-CoV-2  infection, do not rule out co-infections with other pathogens, and should not be used as the sole basis for treatment or other patient management decisions. Negative results must be combined with clinical observations, patient history, and epidemiological information. The expected result is Negative. Fact Sheet for Patients: SugarRoll.be Fact Sheet for Healthcare Providers: https://www.woods-mathews.com/ This test is not yet approved or cleared by the [place] FDA and  has been authorized for detection and/or diagnosis of SARS-CoV-2 by FDA under an Emergency Use Authorization (EUA). This EUA will remain  in effect (meaning this test can be used) for the duration of the COVID-19 declaration under Section 56 4(b)(1) of the Act, 21 U.S.C. section 360bbb-3(b)(1), unless the authorization is terminated or revoked sooner. Performed at [facility], [place] [address], [place], [place] [address]   Blood culture (routine x 2)     Status: None   Collection Time:  04/26/19  1:39 AM   Specimen: BLOOD  Result Value Ref Range Status   Specimen Description BLOOD RIGHT HAND  Final   Special Requests   Final    BOTTLES DRAWN AEROBIC AND ANAEROBIC Blood Culture results may not be optimal due to an inadequate volume of blood received in culture bottles   Culture   Final    NO GROWTH 5 DAYS Performed at [facility], [address] [place], [place] [address]    Report Status 05/01/2019 FINAL  Final  Blood culture (routine x 2)     Status: None   Collection Time: 04/26/19  2:25 AM   Specimen: BLOOD  Result Value Ref Range Status   Specimen Description BLOOD RIGHT HAND  Final   Special Requests   Final    BOTTLES DRAWN AEROBIC AND ANAEROBIC Blood Culture adequate volume   Culture   Final    NO GROWTH 5 DAYS Performed at [facility], [address] [place], [place] [address]    Report Status 05/01/2019 FINAL  Final  MRSA PCR  Screening     Status: None   Collection Time: 04/30/19  3:14 PM   Specimen: Nasal Mucosa; Nasopharyngeal  Result Value Ref Range Status   MRSA by PCR NEGATIVE NEGATIVE Final    Comment:        The GeneXpert MRSA Assay (FDA approved for NASAL specimens only), is one component of a comprehensive MRSA colonization surveillance program. It is not intended to diagnose MRSA infection nor to guide or monitor treatment for MRSA infections. Performed at [facility], [address] [place], [place] [address]   CULTURE, BLOOD (ROUTINE X 2) w Reflex to ID Panel     Status: None (Preliminary result)   Collection Time: 04/30/19  4:07 PM   Specimen: BLOOD  Result Value Ref Range Status   Specimen Description BLOOD RIGHT ANTECUBITAL  Final   Special Requests   Final    BOTTLES DRAWN AEROBIC AND ANAEROBIC Blood Culture adequate volume   Culture   Final    NO GROWTH < 24 HOURS Performed at [facility], [address] [place], [place] [address]    Report Status PENDING  Incomplete     Labs: BNP (last 3 results) Recent Labs    11/13/18 1054  BNP [id number]*   Basic Metabolic Panel: Recent Labs  Lab 04/27/19 0546 04/28/19 0503 04/29/19 0856 04/30/19 1446 05/01/19 0658  NA 139 139 138 138 139  K 4.6 4.4 4.2 4.2 4.9  CL 101 99 100 97* 101  CO2 25 27 25 26 25   GLUCOSE 101* 89 94 142* 103*  BUN 63* 32* 46* 29* 34*  CREATININE 12.66* 8.37* 11.14* 8.80* 10.60*  CALCIUM 8.5* 8.6* 8.2* 8.8* 8.6*  PHOS 3.0  --  3.7  --   --    Liver Function Tests: Recent Labs  Lab 04/26/19 0139 04/27/19 0546 04/29/19 0856  AST 18  --   --   ALT 13  --   --   ALKPHOS 84  --   --   BILITOT 0.7  --   --   PROT 7.8  --   --   ALBUMIN 3.6 3.1* 2.9*   No results for input(s): LIPASE, AMYLASE in the last 168 hours. Recent Labs  Lab 04/30/19 1446  AMMONIA 16   CBC: Recent Labs  Lab 04/26/19 0139  04/27/19 0546 04/27/19 0825 04/28/19 0503 04/29/19 0856 05/01/19 0658   WBC 11.6*   < >  9.5 9.5 7.3 6.1 8.4  NEUTROABS 9.5*  --   --   --   --   --   --   HGB 11.2*   < > 9.4* 9.6* 10.0* 9.3* 10.6*  HCT 35.7*   < > 29.1* 29.9* 31.3* 30.1* 33.9*  MCV 73.0*   < > 72.0* 71.7* 72.5* 74.5* 73.5*  PLT 185   < > 158 127* 141* 170 167   < > = values in this interval not displayed.   Cardiac Enzymes: No results for input(s): CKTOTAL, CKMB, CKMBINDEX, TROPONINI in the last 168 hours. BNP: Invalid input(s): POCBNP CBG: Recent Labs  Lab 05/01/19 1250 05/01/19 1826 05/01/19 2132 05/02/19 0736 05/02/19 1144  GLUCAP 124* 113* 157* 104* 129*   D-Dimer No results for input(s): DDIMER in the last 72 hours. Hgb A1c No results for input(s): HGBA1C in the last 72 hours. Lipid Profile No results for input(s): CHOL, HDL, LDLCALC, TRIG, CHOLHDL, LDLDIRECT in the last 72 hours. Thyroid function studies No results for input(s): TSH, T4TOTAL, T3FREE, THYROIDAB in the last 72 hours.  Invalid input(s): FREET3 Anemia work up No results for input(s): VITAMINB12, FOLATE, FERRITIN, TIBC, IRON, RETICCTPCT in the last 72 hours. Urinalysis    Component Value Date/Time   COLORURINE Straw 02/28/2014 1326   APPEARANCEUR Clear 02/28/2014 1326   LABSPEC 1.010 02/28/2014 1326   PHURINE 5.0 02/28/2014 1326   GLUCOSEU Negative 02/28/2014 1326   HGBUR 1+ 02/28/2014 1326   BILIRUBINUR Negative 02/28/2014 1326   KETONESUR Negative 02/28/2014 1326   PROTEINUR Negative 02/28/2014 1326   NITRITE Negative 02/28/2014 1326   LEUKOCYTESUR Negative 02/28/2014 1326   Sepsis Labs Invalid input(s): PROCALCITONIN,  WBC,  LACTICIDVEN Microbiology Recent Results (from the past 240 hour(s))  SARS CORONAVIRUS 2 (TAT 6-24 HRS) Nasopharyngeal Nasopharyngeal Swab     Status: None   Collection Time: 04/26/19 12:26 AM   Specimen: Nasopharyngeal Swab  Result Value Ref Range Status   SARS Coronavirus 2 NEGATIVE NEGATIVE Final    Comment: (NOTE) SARS-CoV-2 target nucleic acids are NOT  DETECTED. The SARS-CoV-2 RNA is generally detectable in upper and lower respiratory specimens during the acute phase of infection. Negative results do not preclude SARS-CoV-2 infection, do not rule out co-infections with other pathogens, and should not be used as the sole basis for treatment or other patient management decisions. Negative results must be combined with clinical observations, patient history, and epidemiological information. The expected result is Negative. Fact Sheet for Patients: SugarRoll.be Fact Sheet for Healthcare Providers: https://www.woods-mathews.com/ This test is not yet approved or cleared by the [place] FDA and  has been authorized for detection and/or diagnosis of SARS-CoV-2 by FDA under an Emergency Use Authorization (EUA). This EUA will remain  in effect (meaning this test can be used) for the duration of the COVID-19 declaration under Section 56 4(b)(1) of the Act, 21 U.S.C. section 360bbb-3(b)(1), unless the authorization is terminated or revoked sooner. Performed at [facility], [place] [address], [place], [place] [address]   Blood culture (routine x 2)     Status: None   Collection Time: 04/26/19  1:39 AM   Specimen: BLOOD  Result Value Ref Range Status   Specimen Description BLOOD RIGHT HAND  Final   Special Requests   Final    BOTTLES DRAWN AEROBIC AND ANAEROBIC Blood Culture results may not be optimal due to an inadequate volume of blood received in culture bottles   Culture   Final    NO  GROWTH 5 DAYS Performed at [facility], [address] [place], [place] [address]    Report Status 05/01/2019 FINAL  Final  Blood culture (routine x 2)     Status: None   Collection Time: 04/26/19  2:25 AM   Specimen: BLOOD  Result Value Ref Range Status   Specimen Description BLOOD RIGHT HAND  Final   Special Requests   Final    BOTTLES DRAWN AEROBIC AND ANAEROBIC Blood Culture adequate  volume   Culture   Final    NO GROWTH 5 DAYS Performed at [facility], [address] [place], [place] [address]    Report Status 05/01/2019 FINAL  Final  MRSA PCR Screening     Status: None   Collection Time: 04/30/19  3:14 PM   Specimen: Nasal Mucosa; Nasopharyngeal  Result Value Ref Range Status   MRSA by PCR NEGATIVE NEGATIVE Final    Comment:        The GeneXpert MRSA Assay (FDA approved for NASAL specimens only), is one component of a comprehensive MRSA colonization surveillance program. It is not intended to diagnose MRSA infection nor to guide or monitor treatment for MRSA infections. Performed at [facility], [address] [place], [place] [address]   CULTURE, BLOOD (ROUTINE X 2) w Reflex to ID Panel     Status: None (Preliminary result)   Collection Time: 04/30/19  4:07 PM   Specimen: BLOOD  Result Value Ref Range Status   Specimen Description BLOOD RIGHT ANTECUBITAL  Final   Special Requests   Final    BOTTLES DRAWN AEROBIC AND ANAEROBIC Blood Culture adequate volume   Culture   Final    NO GROWTH < 24 HOURS Performed at [facility], [address] [place], [place] [address]    Report Status PENDING  Incomplete     Time coordinating discharge: Over 30 minutes  SIGNED:   Nicolette Bang, MD  [facility] 05/02/2019, 12:57 PM Pager   If 7PM-7AM, please contact night-coverage [email or web address] Password [email or web address]

## 2019-05-02 NOTE — TOC Transition Note (Signed)
Transition of Care [facility]) - CM/SW Discharge Note   Patient Details  Name: [name] MRN: [id number] Date of Birth: 1951/01/30  Transition of Care [facility]) CM/SW Contact:  Latanya Maudlin, RN Phone Number: 05/02/2019, 2:05 PM   Clinical Narrative:  Patient to be discharged per MD order. Orders in place for home health services. Patient was previously [facility] for [facility]. Notified [facility] at [facility] of discharge. Patient in need of DME Wheelchair. Orders and narrative in place. Obtained from [place] [facility] and delivered to the room. Once family arrives we will discuss if they can transport or if EMS is needed.     Final next level of care: Home w Home Health Services Barriers to Discharge: No Barriers Identified   Patient Goals and CMS Choice   CMS Medicare.gov Compare Post Acute Care list provided to:: Patient Choice offered to / list presented to : Patient  Discharge Placement                       Discharge Plan and Services   Discharge Planning Services: CM Consult            DME Arranged: Wheelchair manual DME Agency: [facility] Date DME Agency Contacted: 05/02/19 Time DME Agency Contacted: [phone] Representative spoke with at DME Agency: [place]: RN, PT, Nurse's Aide [place] Agency: [place] ([place]) Date [place]: 05/02/19 Time [place]: 1405 Representative spoke with at [place]: [place] ([place]) Interventions     Readmission Risk Interventions Readmission Risk Prevention Plan 05/02/2019 04/28/2019 02/20/2018  Transportation Screening Complete Complete -  PCP or Specialist Appt within 3-5 Days Complete - -  Home Care Screening - - Not Complete  HRI or Home Care Consult Complete Complete -  Social Work Consult for [place] Planning/Counseling Patient refused - -  Palliative Care Screening Patient Refused - -  Medication Review Press photographer) Complete Complete -   Some recent data might be hidden

## 2019-05-05 LAB — CULTURE, BLOOD (ROUTINE X 2)
Culture: NO GROWTH
Special Requests: ADEQUATE

## 2019-05-27 ENCOUNTER — Inpatient Hospital Stay
Admission: EM | Admit: 2019-05-27 | Discharge: 2019-06-01 | DRG: 193 | Disposition: A | Discharge status: Hospice | Payer: Medicare Other | Attending: Internal Medicine | Admitting: Internal Medicine

## 2019-05-27 ENCOUNTER — Emergency Department: Payer: Medicare Other

## 2019-05-27 ENCOUNTER — Other Ambulatory Visit: Payer: Self-pay

## 2019-05-27 DIAGNOSIS — Z825 Family history of asthma and other chronic lower respiratory diseases: Secondary | ICD-10-CM | POA: Diagnosis not present

## 2019-05-27 DIAGNOSIS — Z515 Encounter for palliative care: Secondary | ICD-10-CM | POA: Diagnosis not present

## 2019-05-27 DIAGNOSIS — Z8249 Family history of ischemic heart disease and other diseases of the circulatory system: Secondary | ICD-10-CM

## 2019-05-27 DIAGNOSIS — Z833 Family history of diabetes mellitus: Secondary | ICD-10-CM | POA: Diagnosis not present

## 2019-05-27 DIAGNOSIS — Z7902 Long term (current) use of antithrombotics/antiplatelets: Secondary | ICD-10-CM

## 2019-05-27 DIAGNOSIS — E440 Moderate protein-calorie malnutrition: Secondary | ICD-10-CM | POA: Diagnosis present

## 2019-05-27 DIAGNOSIS — J181 Lobar pneumonia, unspecified organism: Secondary | ICD-10-CM | POA: Diagnosis present

## 2019-05-27 DIAGNOSIS — Z66 Do not resuscitate: Secondary | ICD-10-CM | POA: Diagnosis present

## 2019-05-27 DIAGNOSIS — F0390 Unspecified dementia without behavioral disturbance: Secondary | ICD-10-CM

## 2019-05-27 DIAGNOSIS — I5032 Chronic diastolic (congestive) heart failure: Secondary | ICD-10-CM | POA: Diagnosis present

## 2019-05-27 DIAGNOSIS — Z823 Family history of stroke: Secondary | ICD-10-CM

## 2019-05-27 DIAGNOSIS — R1310 Dysphagia, unspecified: Secondary | ICD-10-CM

## 2019-05-27 DIAGNOSIS — M109 Gout, unspecified: Secondary | ICD-10-CM | POA: Diagnosis present

## 2019-05-27 DIAGNOSIS — R627 Adult failure to thrive: Secondary | ICD-10-CM | POA: Diagnosis present

## 2019-05-27 DIAGNOSIS — D631 Anemia in chronic kidney disease: Secondary | ICD-10-CM | POA: Diagnosis present

## 2019-05-27 DIAGNOSIS — Z8673 Personal history of transient ischemic attack (TIA), and cerebral infarction without residual deficits: Secondary | ICD-10-CM | POA: Diagnosis not present

## 2019-05-27 DIAGNOSIS — Z20828 Contact with and (suspected) exposure to other viral communicable diseases: Secondary | ICD-10-CM | POA: Diagnosis present

## 2019-05-27 DIAGNOSIS — G40909 Epilepsy, unspecified, not intractable, without status epilepticus: Secondary | ICD-10-CM

## 2019-05-27 DIAGNOSIS — Z7982 Long term (current) use of aspirin: Secondary | ICD-10-CM

## 2019-05-27 DIAGNOSIS — E782 Mixed hyperlipidemia: Secondary | ICD-10-CM | POA: Diagnosis present

## 2019-05-27 DIAGNOSIS — I10 Essential (primary) hypertension: Secondary | ICD-10-CM

## 2019-05-27 DIAGNOSIS — I2510 Atherosclerotic heart disease of native coronary artery without angina pectoris: Secondary | ICD-10-CM | POA: Diagnosis present

## 2019-05-27 DIAGNOSIS — E1029 Type 1 diabetes mellitus with other diabetic kidney complication: Secondary | ICD-10-CM | POA: Diagnosis not present

## 2019-05-27 DIAGNOSIS — Z7401 Bed confinement status: Secondary | ICD-10-CM

## 2019-05-27 DIAGNOSIS — I132 Hypertensive heart and chronic kidney disease with heart failure and with stage 5 chronic kidney disease, or end stage renal disease: Secondary | ICD-10-CM | POA: Diagnosis present

## 2019-05-27 DIAGNOSIS — Z87891 Personal history of nicotine dependence: Secondary | ICD-10-CM

## 2019-05-27 DIAGNOSIS — I69354 Hemiplegia and hemiparesis following cerebral infarction affecting left non-dominant side: Secondary | ICD-10-CM

## 2019-05-27 DIAGNOSIS — F0150 Vascular dementia without behavioral disturbance: Secondary | ICD-10-CM | POA: Diagnosis present

## 2019-05-27 DIAGNOSIS — I255 Ischemic cardiomyopathy: Secondary | ICD-10-CM | POA: Diagnosis present

## 2019-05-27 DIAGNOSIS — Z8711 Personal history of peptic ulcer disease: Secondary | ICD-10-CM

## 2019-05-27 DIAGNOSIS — N186 End stage renal disease: Secondary | ICD-10-CM | POA: Diagnosis present

## 2019-05-27 DIAGNOSIS — J189 Pneumonia, unspecified organism: Secondary | ICD-10-CM | POA: Diagnosis present

## 2019-05-27 DIAGNOSIS — I252 Old myocardial infarction: Secondary | ICD-10-CM

## 2019-05-27 DIAGNOSIS — K219 Gastro-esophageal reflux disease without esophagitis: Secondary | ICD-10-CM | POA: Diagnosis present

## 2019-05-27 DIAGNOSIS — I69391 Dysphagia following cerebral infarction: Secondary | ICD-10-CM

## 2019-05-27 DIAGNOSIS — T8612 Kidney transplant failure: Secondary | ICD-10-CM | POA: Diagnosis present

## 2019-05-27 DIAGNOSIS — E1122 Type 2 diabetes mellitus with diabetic chronic kidney disease: Secondary | ICD-10-CM | POA: Diagnosis present

## 2019-05-27 DIAGNOSIS — R531 Weakness: Secondary | ICD-10-CM

## 2019-05-27 DIAGNOSIS — N189 Chronic kidney disease, unspecified: Secondary | ICD-10-CM | POA: Diagnosis present

## 2019-05-27 DIAGNOSIS — Z992 Dependence on renal dialysis: Secondary | ICD-10-CM

## 2019-05-27 DIAGNOSIS — Z7189 Other specified counseling: Secondary | ICD-10-CM | POA: Diagnosis not present

## 2019-05-27 HISTORY — DX: Unspecified dementia, unspecified severity, without behavioral disturbance, psychotic disturbance, mood disturbance, and anxiety: F03.90

## 2019-05-27 HISTORY — DX: Pneumonia, unspecified organism: J18.9

## 2019-05-27 LAB — CREATININE, SERUM
Creatinine, Ser: 11.58 mg/dL — ABNORMAL HIGH (ref 0.61–1.24)
GFR calc Af Amer: 5 mL/min — ABNORMAL LOW (ref >60)
GFR calc non Af Amer: 4 mL/min — ABNORMAL LOW (ref >60)

## 2019-05-27 LAB — CBC
HCT: 33.6 % — ABNORMAL LOW (ref 39.0–52.0)
HCT: 31.8 % — ABNORMAL LOW (ref 39.0–52.0)
Hemoglobin: 10.6 g/dL — ABNORMAL LOW (ref 13.0–17.0)
Hemoglobin: 9.9 g/dL — ABNORMAL LOW (ref 13.0–17.0)
MCH: 23 pg — ABNORMAL LOW (ref 26.0–34.0)
MCH: 23.1 pg — ABNORMAL LOW (ref 26.0–34.0)
MCHC: 31.5 g/dL (ref 30.0–36.0)
MCHC: 31.1 g/dL (ref 30.0–36.0)
MCV: 73 fL — ABNORMAL LOW (ref 80.0–100.0)
MCV: 74.1 fL — ABNORMAL LOW (ref 80.0–100.0)
Platelets: 175 10*3/uL (ref 150–400)
Platelets: 171 10*3/uL (ref 150–400)
RBC: 4.6 MIL/uL (ref 4.22–5.81)
RBC: 4.29 MIL/uL (ref 4.22–5.81)
RDW: 16.5 % — ABNORMAL HIGH (ref 11.5–15.5)
RDW: 16.1 % — ABNORMAL HIGH (ref 11.5–15.5)
WBC: 12 10*3/uL — ABNORMAL HIGH (ref 4.0–10.5)
WBC: 8.9 10*3/uL (ref 4.0–10.5)
nRBC: 0 % (ref 0.0–0.2)
nRBC: 0 % (ref 0.0–0.2)

## 2019-05-27 LAB — BASIC METABOLIC PANEL
Anion gap: 12 (ref 5–15)
BUN: 46 mg/dL — ABNORMAL HIGH (ref 8–23)
CO2: 28 mmol/L (ref 22–32)
Calcium: 9.4 mg/dL (ref 8.9–10.3)
Chloride: 96 mmol/L — ABNORMAL LOW (ref 98–111)
Creatinine, Ser: 11.24 mg/dL — ABNORMAL HIGH (ref 0.61–1.24)
GFR calc Af Amer: 5 mL/min — ABNORMAL LOW (ref >60)
GFR calc non Af Amer: 4 mL/min — ABNORMAL LOW (ref >60)
Glucose, Bld: 117 mg/dL — ABNORMAL HIGH (ref 70–99)
Potassium: 5 mmol/L (ref 3.5–5.1)
Sodium: 136 mmol/L (ref 135–145)

## 2019-05-27 LAB — PROCALCITONIN: Procalcitonin: 0.42 ng/mL

## 2019-05-27 LAB — SARS CORONAVIRUS 2 (TAT 6-24 HRS): SARS Coronavirus 2: NEGATIVE

## 2019-05-27 LAB — MAGNESIUM
Magnesium: 2 mg/dL (ref 1.7–2.4)
Magnesium: 2.1 mg/dL (ref 1.7–2.4)

## 2019-05-27 LAB — PHOSPHORUS
Phosphorus: 1.9 mg/dL — ABNORMAL LOW (ref 2.5–4.6)
Phosphorus: 2 mg/dL — ABNORMAL LOW (ref 2.5–4.6)

## 2019-05-27 LAB — POC SARS CORONAVIRUS 2 AG -  ED: SARS Coronavirus 2 Ag: NEGATIVE

## 2019-05-27 LAB — BRAIN NATRIURETIC PEPTIDE: B Natriuretic Peptide: 409 pg/mL — ABNORMAL HIGH (ref 0.0–100.0)

## 2019-05-27 MED ORDER — ACETAMINOPHEN 325 MG PO TABS: 650.0000 mg | ORAL_TABLET | ORAL | Status: DC | PRN

## 2019-05-27 MED ORDER — ATORVASTATIN CALCIUM 20 MG PO TABS
80.0000 mg | ORAL_TABLET | Freq: Every day | ORAL | Status: DC
  Administered 2019-05-27 – 2019-05-31 (×5): 80 mg via ORAL
  Filled 2019-05-27: qty 1
  Filled 2019-05-27 (×4): qty 4

## 2019-05-27 MED ORDER — ONDANSETRON HCL 4 MG/2ML IJ SOLN: 4.0000 mg | Freq: Four times a day (QID) | INTRAMUSCULAR | Status: DC | PRN

## 2019-05-27 MED ORDER — ACETAMINOPHEN 325 MG PO TABS: 650.0000 mg | ORAL_TABLET | Freq: Four times a day (QID) | ORAL | Status: DC | PRN

## 2019-05-27 MED ORDER — LOSARTAN POTASSIUM 25 MG PO TABS
25.0000 mg | ORAL_TABLET | Freq: Every day | ORAL | Status: DC
  Administered 2019-05-27 – 2019-05-28 (×2): 25 mg via ORAL
  Filled 2019-05-27 (×2): qty 1

## 2019-05-27 MED ORDER — ALLOPURINOL 100 MG PO TABS
100.0000 mg | ORAL_TABLET | Freq: Every day | ORAL | Status: DC
  Administered 2019-05-27 – 2019-05-31 (×4): 100 mg via ORAL
  Filled 2019-05-27 (×7): qty 1

## 2019-05-27 MED ORDER — METOPROLOL SUCCINATE ER 25 MG PO TB24
25.0000 mg | ORAL_TABLET | ORAL | Status: DC
  Administered 2019-05-28 – 2019-05-31 (×3): 25 mg via ORAL
  Filled 2019-05-27 (×4): qty 1

## 2019-05-27 MED ORDER — HEPARIN SODIUM (PORCINE) 5000 UNIT/ML IJ SOLN
5000.0000 [IU] | Freq: Three times a day (TID) | INTRAMUSCULAR | Status: DC
  Administered 2019-05-27 – 2019-06-01 (×15): 5000 [IU] via SUBCUTANEOUS
  Filled 2019-05-27 (×17): qty 1

## 2019-05-27 MED ORDER — CHLORHEXIDINE GLUCONATE CLOTH 2 % EX PADS
6.0000 | MEDICATED_PAD | Freq: Every day | CUTANEOUS | Status: DC
  Administered 2019-05-30 – 2019-06-01 (×3): 6 via TOPICAL
  Filled 2019-05-27 (×2): qty 6

## 2019-05-27 MED ORDER — SODIUM CHLORIDE 0.9 % IV SOLN
1.0000 g | Freq: Once | INTRAVENOUS | Status: AC
  Administered 2019-05-27: 1 g via INTRAVENOUS
  Filled 2019-05-27: qty 10

## 2019-05-27 MED ORDER — HEPARIN SODIUM (PORCINE) 1000 UNIT/ML DIALYSIS: 1000.0000 [IU] | INTRAMUSCULAR | Status: DC | PRN

## 2019-05-27 MED ORDER — LEVETIRACETAM 100 MG/ML PO SOLN
500.0000 mg | Freq: Every day | ORAL | Status: DC
  Administered 2019-05-27 – 2019-05-31 (×4): 500 mg via ORAL
  Filled 2019-05-27 (×8): qty 5

## 2019-05-27 MED ORDER — METOPROLOL SUCCINATE ER 25 MG PO TB24
12.5000 mg | ORAL_TABLET | ORAL | Status: DC
  Administered 2019-05-27 – 2019-05-29 (×2): 12.5 mg via ORAL
  Filled 2019-05-27: qty 0.5

## 2019-05-27 MED ORDER — SODIUM CHLORIDE 0.9 % IV SOLN
500.0000 mg | Freq: Once | INTRAVENOUS | Status: AC
  Administered 2019-05-27: 13:00:00 500 mg via INTRAVENOUS
  Filled 2019-05-27: qty 500

## 2019-05-27 MED ORDER — ONDANSETRON HCL 4 MG PO TABS
4.0000 mg | ORAL_TABLET | Freq: Four times a day (QID) | ORAL | Status: DC | PRN
  Filled 2019-05-27: qty 1

## 2019-05-27 MED ORDER — CLOPIDOGREL BISULFATE 75 MG PO TABS
75.0000 mg | ORAL_TABLET | Freq: Every day | ORAL | Status: DC
  Administered 2019-05-28 – 2019-05-31 (×3): 75 mg via ORAL
  Filled 2019-05-27 (×4): qty 1

## 2019-05-27 MED ORDER — MEMANTINE HCL 5 MG PO TABS
5.0000 mg | ORAL_TABLET | Freq: Every day | ORAL | Status: DC
  Administered 2019-05-27 – 2019-05-31 (×5): 5 mg via ORAL
  Filled 2019-05-27 (×5): qty 1

## 2019-05-27 MED ORDER — ACETAMINOPHEN 650 MG RE SUPP: 650.0000 mg | Freq: Four times a day (QID) | RECTAL | Status: DC | PRN

## 2019-05-27 MED ORDER — PIPERACILLIN-TAZOBACTAM 3.375 G IVPB
3.3750 g | Freq: Two times a day (BID) | INTRAVENOUS | Status: DC
  Administered 2019-05-27 – 2019-05-28 (×2): 3.375 g via INTRAVENOUS
  Filled 2019-05-27 (×5): qty 50

## 2019-05-27 MED ORDER — SODIUM CHLORIDE 0.9% FLUSH
3.0000 mL | Freq: Two times a day (BID) | INTRAVENOUS | Status: DC
  Administered 2019-05-27 – 2019-06-01 (×8): 3 mL via INTRAVENOUS

## 2019-05-27 MED ORDER — MIRTAZAPINE 15 MG PO TABS
15.0000 mg | ORAL_TABLET | Freq: Every day | ORAL | Status: DC
  Administered 2019-05-28 – 2019-05-31 (×3): 15 mg via ORAL
  Filled 2019-05-27 (×3): qty 1

## 2019-05-27 MED ORDER — ASPIRIN EC 81 MG PO TBEC
81.0000 mg | DELAYED_RELEASE_TABLET | Freq: Every day | ORAL | Status: DC
  Administered 2019-05-27 – 2019-05-31 (×6): 81 mg via ORAL
  Filled 2019-05-27 (×6): qty 1

## 2019-05-27 MED ORDER — CALCIUM ACETATE (PHOS BINDER) 667 MG PO CAPS
1334.0000 mg | ORAL_CAPSULE | Freq: Three times a day (TID) | ORAL | Status: DC
  Administered 2019-05-27 – 2019-05-31 (×10): 1334 mg via ORAL
  Filled 2019-05-27 (×17): qty 2

## 2019-05-27 NOTE — Progress Notes (Signed)
[facility] [place] 05/27/19  Subjective:   LOS: 0 No intake/output data recorded. Patient presented to the emergency room for weakness.  Recent hospitalization in November for right lower lobe pneumonia.  Admitted for possible aspiration component.  Started treatment with Zosyn. Last HD was on December 8.  Objective:  Vital signs in last 24 hours:  Temp:  [97.5 F (36.4 C)] 97.5 F (36.4 C) (12/10 0706) Pulse Rate:  [74] 74 (12/10 0706) Resp:  [19] 19 (12/10 0706) BP: (159)/(85) 159/85 (12/10 0706) SpO2:  [99 %] 99 % (12/10 0706) Weight:  [73.5 kg] 73.5 kg (12/10 0707)  Weight change:  Filed Weights   05/27/19 0707  Weight: 73.5 kg    Intake/Output:   No intake or output data in the 24 hours ending 05/27/19 1115   Physical Exam: General: NAD  HEENT Anicteric, moist oral mucous membranes  Pulm/lungs  normal breathing effort on room air, clear to auscultation  CVS/Heart  no rub or gallop  Abdomen:   Soft, nontender  Extremities:  No peripheral edema  Neurologic:  Lethargic but arousable,  Skin:  No acute rashes [place] Mustard    Basic Metabolic Panel:  Recent Labs  Lab 05/27/19 0918  NA 136  K 5.0  CL 96*  CO2 28  GLUCOSE 117*  BUN 46*  CREATININE 11.24*  CALCIUM 9.4  MG 2.0  PHOS 1.9*     CBC: Recent Labs  Lab 05/27/19 0918  WBC 12.0*  HGB 10.6*  HCT 33.6*  MCV 73.0*  PLT 175      Lab Results  Component Value Date   HEPBSAG NON REACTIVE 05/01/2019   HEPBSAB Reactive (A) 05/01/2019   HEPBIGM NON REACTIVE 05/01/2019      Microbiology:  No results found for this or any previous visit (from the past 240 hour(s)).  Coagulation Studies: No results for input(s): LABPROT, INR in the last 72 hours.  Urinalysis: No results for input(s): COLORURINE, LABSPEC, PHURINE, GLUCOSEU, HGBUR, BILIRUBINUR, KETONESUR, PROTEINUR, UROBILINOGEN, NITRITE, LEUKOCYTESUR in the last 72 hours.  Invalid input(s): APPERANCEUR     Imaging: CT Head Wo Contrast  Result Date: 05/27/2019 CLINICAL DATA:  Weakness. EXAM: CT HEAD WITHOUT CONTRAST TECHNIQUE: Contiguous axial images were obtained from the base of the skull through the vertex without intravenous contrast. COMPARISON:  April 30, 2019. FINDINGS: Brain: Mild diffuse cortical atrophy is noted. Mild chronic ischemic white matter disease is noted. No mass effect or midline shift is noted. Ventricular size is within normal limits. There is no evidence of mass lesion, hemorrhage or acute infarction. Vascular: No hyperdense vessel or unexpected calcification. Skull: Normal. Negative for fracture or focal lesion. Sinuses/Orbits: No acute finding. Other: None. IMPRESSION: Mild diffuse cortical atrophy. Mild chronic ischemic white matter disease. No acute intracranial abnormality seen. Electronically Signed   By: Marijo Conception M.D.   On: 05/27/2019 08:51   DG Chest Port 1 View  Result Date: 05/27/2019 CLINICAL DATA:  Weakness. EXAM: PORTABLE CHEST 1 VIEW COMPARISON:  Chest radiograph 04/30/2019 FINDINGS: Mild cardiomegaly.  Sequela of prior median sternotomy. Opacity at the left lung base may reflect atelectasis, pneumonia and/or effusion. The right lung is clear. No evidence of pneumothorax. No acute bony abnormality Overlying cardiac monitoring leads. Partially visualized vascular stent within the left axilla. IMPRESSION: Mild cardiomegaly. Opacity at the left lung base may reflect atelectasis, pneumonia and/or effusion. Electronically Signed   By: Kellie Simmering DO   On: 05/27/2019 07:39  Medications:   . azithromycin    . cefTRIAXone (ROCEPHIN)  IV 1 g (05/27/19 1103)   . heparin  5,000 Units Subcutaneous Q8H  . sodium chloride flush  3 mL Intravenous Q12H   acetaminophen **OR** acetaminophen, acetaminophen, ondansetron (ZOFRAN) IV, ondansetron **OR** ondansetron (ZOFRAN) IV  Assessment/ Plan:  68 y.o. [place] ([place])  male with end stage renal disease on  hemodialysis, history of renal transplant, hypertension, diabetes mellitus type II, peptic ulcer disease, gout, diastolic congestive heart failure.   Active Problems:   Pneumonia  PRD N18.6 End stage renal disease Modality In-Center Hemodialysis Shift [facility]-1 [facility] Start Date 03/15/2014 FDODE 05/22/1995 Metrics Height 149.9 cm / 59 inches Last Tx Weight 68.1 kgs / 150 lbs BMI 30.32 Access Primary Access AV Graft Site Arm (Left lower) Placement Date 02/16/12 Treatment Details Time 195 Minutes Target Weight 68.5 Kg [facility] [facility]/   Results for ALEXSANDER, STAIB (MRN [id number]) as of 05/27/2019 11:19  Ref. Range 05/01/2019 14:00  Hep B S Ab Latest Ref Range: NON REACTIVE  Reactive (A)    #. ESRD, TTS We will arrange for hemodialysis today uf goal 1.5-2 kg  #. Anemia of CKD  Lab Results  Component Value Date   HGB 10.6 (L) 05/27/2019   Low dose EPO with HD  #. SHPTH  No results found for: PTH Lab Results  Component Value Date   PHOS 1.9 (L) 05/27/2019   Monitor calcium and phos level during this admission   #. Diabetes type 2 with CKD Hgb A1c MFr Bld (%)  Date Value  04/26/2019 5.8 (H)   #Weakness Chest x-ray shows cardiomegaly, opacity at left lung base the possible pneumonia.  Currently being treated with IV Zosyn.  Continue to monitor.    LOS: 0 [name] 12/10/202011:15 AM  [facility] [place], [place]

## 2019-05-27 NOTE — ED Triage Notes (Signed)
Pt arrives from home for weakness this AM- pt has not had his dialysis today- pt has a hx of dementia and is nonverbal at baseline

## 2019-05-27 NOTE — Progress Notes (Signed)
HD Tx Completed   05/27/19 2100  Vital Signs  Pulse Rate 77  Resp 19  BP (!) 171/84  Oxygen Therapy  SpO2 100 %  O2 Device Room Air  During Hemodialysis Assessment  Blood Flow Rate (mL/min) 385 mL/min  Arterial Pressure (mmHg) -12 mmHg  Venous Pressure (mmHg) 320 mmHg  Transmembrane Pressure (mmHg) 20 mmHg  Ultrafiltration Rate (mL/min) 240 mL/min  Dialysate Flow Rate (mL/min) 600 ml/min  Conductivity: Machine  14  HD Safety Checks Performed Yes  Intra-Hemodialysis Comments Tx completed

## 2019-05-27 NOTE — Progress Notes (Signed)
HD Tx Started   05/27/19 1755  Vital Signs  Pulse Rate 65  Resp 18  BP (!) 187/77  BP Location Right Arm  BP Method Automatic  Patient Position (if appropriate) Lying  Oxygen Therapy  SpO2 100 %  O2 Device Room Air  During Hemodialysis Assessment  Blood Flow Rate (mL/min) 400 mL/min  Arterial Pressure (mmHg) -150 mmHg  Venous Pressure (mmHg) 250 mmHg  Transmembrane Pressure (mmHg) 40 mmHg  Ultrafiltration Rate (mL/min) 240 mL/min  Dialysate Flow Rate (mL/min) 600 ml/min  Conductivity: Machine  13.9  HD Safety Checks Performed Yes  Dialysis Fluid Bolus Normal Saline  Bolus Amount (mL) 250 mL  Intra-Hemodialysis Comments Tx initiated;Progressing as prescribed

## 2019-05-27 NOTE — Consult Note (Signed)
Pharmacy Antibiotic Note  [name] is a 68 y.o. male admitted on 05/27/2019 with Aspiration Pneumonia.  Pharmacy has been consulted for Zosyn dosing. Patient has ESRD (TTS).   Plan: 1. Zosyn 3.375 (EI) Q12H   Height: 5\' 4"  (162.6 cm) Weight: 162 lb (73.5 kg) IBW/kg (Calculated) : 59.2  Temp (24hrs), Avg:97.5 F (36.4 C), Min:97.5 F (36.4 C), Max:97.5 F (36.4 C)  Recent Labs  Lab 05/27/19 0918  WBC 12.0*  CREATININE 11.24*    Estimated Creatinine Clearance: 5.9 mL/min (A) (by C-G formula based on SCr of 11.24 mg/dL (H)).    Allergies  Allergen Reactions  . Ivp Dye [Iodinated Diagnostic Agents] Other (See Comments)    Pt denied  . Metrizamide Other (See Comments)    Contains iodine    Thank you for allowing pharmacy to be a part of this patient's care.  Rowland Lathe 05/27/2019 11:17 AM

## 2019-05-27 NOTE — Progress Notes (Signed)
   05/27/19 1745  Neurological  Level of Consciousness Responds to Voice  Orientation Level Oriented to person  Respiratory  Respiratory Pattern Regular;Unlabored  Bilateral Breath Sounds Diminished  Cardiac  Pulse Regular  Heart Sounds S1, S2  ECG Monitor Yes  STABLE FOR HD TX AVG +/+ UFG 1.5L

## 2019-05-27 NOTE — ED Notes (Signed)
Pt taken to CT.

## 2019-05-27 NOTE — ED Notes (Signed)
This RN and Romie Minus attempted to find IV access with no success

## 2019-05-27 NOTE — Progress Notes (Signed)
Post HD Tx Note  Pt remains lethargic however responds to voice and pain   05/27/19 2115  Hand-Off documentation  Report given to (Full Name) Seth Bake RN   Report received from (Full Name) Clinton Quant RN   Vital Signs  Temp 98.8 F (37.1 C)  Temp Source Axillary  Pulse Rate 78  Resp (!) 22  BP (!) 184/79  Oxygen Therapy  SpO2 100 %  O2 Device Room Air  Post-Hemodialysis Assessment  Rinseback Volume (mL) 250 mL  KECN 68.7 V  Dialyzer Clearance Lightly streaked  Duration of HD Treatment -hour(s) 3 hour(s)  Hemodialysis Intake (mL) 500 mL  UF Total -Machine (mL) 1000 mL  Net UF (mL) 500 mL  Tolerated HD Treatment Yes  AVG/AVF Arterial Site Held (minutes) 13 minutes  AVG/AVF Venous Site Held (minutes) 13 minutes

## 2019-05-27 NOTE — ED Notes (Signed)
IV team at bedside 

## 2019-05-27 NOTE — H&P (Addendum)
History and Physical    [name] [id number] DOB: 1950-10-28 DOA: 05/27/2019  **Will admit patient based on the expectation that the patient will need hospitalization/ hospital care that crosses at least 2 midnights  PCP: Kirk Ruths, MD   Attending physician: Renne Crigler  Patient coming from/Resides with: Private residence-family primary caretakers  Chief Complaint: Generalized weakness  HPI: [name] is a 68 y.o. 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  Patient was recently discharged on 11/15 after an admission for right lower lobe pneumonia with sepsis physiology.  Due to patient's significant physical debility and recurrent encephalopathy extensive discussions were held with the family guarding placement in skilled nursing facility but they declined.  It was documented that patient will be high risk for admission with significant needs at home if patient's family continue to decline SNF.  No family available at bedside during admission.  Patient presented from home with family reporting weakness that began this morning and was unable to attend dialysis.  Upon my evaluation of the patient he was awake and able to communicate that he was not hungry, he felt generally bad all over.  He was able to follow commands and is noted he has left hemiparesis.  Patient did have subtle leukocytosis of 12,000 with a pro calcitonin of 0.42 which has decreased from procalcitonin 2.34 on date of discharge.  SARS-CoV-2 test is pending at time of evaluation.  Chest x-ray states opacity in the left lung base.  Upon review of films from 11/13 this is similar to previous films.  Saturations are stable at 99%.  Given patient's extensive debility, requirement for dialysis and concerns of a  possible SARS-CoV-2 etiology to symptoms patient will placed in inpatient status for diagnosis of pneumonia of uncertain etiology.  ED Course: Vital Signs: BP (!) 159/85 (BP Location: Right Arm)    Pulse 74    Temp (!) 97.5 F (36.4 C) (Oral)    Resp 19    Ht 5\' 4"  (1.626 m)    Wt 73.5 kg    SpO2 99%    BMI 27.81 kg/m   Review of Systems:  In addition to the HPI above,  No Fever-chills,+ abrupt onset of myalgias this a.m., no other constitutional symptoms No Headache, changes with Vision or hearing, new weakness, tingling, numbness in any extremity, dizziness, dysarthria or word finding difficulty, gait disturbance or imbalance, tremors or seizure activity No new problems swallowing food or Liquids, indigestion/reflux, choking or coughing while eating, abdominal pain with or after eating No Chest pain, Cough or Shortness of Breath, palpitations, orthopnea or DOE No Abdominal pain, N/V, melena,hematochezia, dark tarry stools, constipation No dysuria, malodorous urine, hematuria or flank pain No new skin rashes, lesions, masses or bruises, No new joint pains, aches, swelling or redness No recent unintentional weight gain or loss No polyuria, polydypsia or polyphagia   Past Medical History:  Diagnosis Date   Chronic diastolic congestive heart failure (HCC)    Chronic disease anemia    Dementia (HCC)    ESRD (end stage renal disease) on dialysis ([place])    "[place]; [place]; [name]" (09/29/2014)   GERD (gastroesophageal reflux disease)    Gout    High cholesterol    History of blood transfusion    "related to anemia"   History of stomach ulcers  Hypertension    Pneumonia    Type II diabetes mellitus ([place])     Past Surgical History:  Procedure Laterality Date   ARTERIOVENOUS GRAFT PLACEMENT Left ~ [place]  09/29/2014   "[place]"   CARDIAC CATHETERIZATION N/A 02/26/2016   Procedure: Left Heart Cath and Coronary Angiography Possible PCI;   Surgeon: [name], MD;  Location: [place] [facility];  Service: Cardiovascular;  Laterality: N/A;   ENDOSCOPIC RETROGRADE CHOLANGIOPANCREATOGRAPHY (ERCP) WITH PROPOFOL N/A 09/17/2016   Procedure: ENDOSCOPIC RETROGRADE CHOLANGIOPANCREATOGRAPHY (ERCP) WITH PROPOFOL;  Surgeon: [name], MD;  Location: [facility] ENDOSCOPY;  Service: Endoscopy;  Laterality: N/A;   ERCP N/A 09/10/2016   Procedure: ENDOSCOPIC RETROGRADE CHOLANGIOPANCREATOGRAPHY (ERCP);  Surgeon: [name], MD;  Location: [facility] ENDOSCOPY;  Service: Endoscopy;  Laterality: N/A;   KIDNEY TRANSPLANT Right 2004   NEPHRECTOMY TRANSPLANTED ORGAN  2015   PERCUTANEOUS CORONARY STENT INTERVENTION (PCI-S) N/A 09/30/2014   Procedure: PERCUTANEOUS CORONARY STENT INTERVENTION (PCI-S);  Surgeon: [name], MD;  Location[facility] C[facility];  Service: Cardiovascular;  Laterality: N/A;   PERITONEAL CATHETER INSERTION  02/2014   PERITONEAL CATHETER REMOVAL  08/2014   THROMBECTOMY / ARTERIOVENOUS GRAFT REVISION  2015    Social History   Socioeconomic History   Marital status: Married    Spouse name: Not on file   Number of children: Not on file   Years of education: Not on file   Highest education level: Not on file  Occupational History   Not on file  Tobacco Use   Smoking status: Former Smoker    Types: Cigarettes   Smokeless tobacco: Never Used   Tobacco comment: "quit smoking cigarettes in the 1980's"  Substance and Sexual Activity   Alcohol use: No   Drug use: No   Sexual activity: Not Currently  Other Topics Concern   Not on file  Social History Narrative   Not on file   Social Determinants of Health   Financial Resource Strain:    Difficulty of Paying Living Expenses: Not on file  Food Insecurity:    Worried About [place] in the Last Year: Not on file   [place] in the Last Year: Not on file  Transportation Needs:    Lack of Transportation (Medical): Not on file   Lack of  Transportation (Non-Medical): Not on file  Physical Activity:    Days of Exercise per Week: Not on file   Minutes of Exercise per Session: Not on file  Stress:    Feeling of Stress : Not on file  Social Connections:    Frequency of Communication with Friends and Family: Not on file   Frequency of Social Gatherings with Friends and Family: Not on file   Attends Religious Services: Not on file   Active Member of Clubs or Organizations: Not on file   Attends Archivist Meetings: Not on file   Marital Status: Not on file  Intimate Partner Violence:    Fear of Current or Ex-Partner: Not on file   Emotionally Abused: Not on file   Physically Abused: Not on file   Sexually Abused: Not on file    Mobility: Bedbound Work history: Disabled   Allergies  Allergen Reactions   Ivp Dye [Iodinated Diagnostic Agents] Other (See Comments)    Pt denied   Metrizamide Other (See Comments)    Contains iodine    Family History  Problem Relation Age of Onset   Hypertension Mother  Stroke Mother    Hypertension Father    Diabetes Mellitus II Father    Asthma Father      Prior to Admission medications   Medication Sig Start Date End Date Taking? Authorizing Provider  allopurinol (ZYLOPRIM) 100 MG tablet Take 100 mg by mouth daily. 09/23/14  Yes [provider]  aspirin 81 MG EC tablet Take 81 mg by mouth daily. 09/23/14  Yes [provider]  atorvastatin (LIPITOR) 80 MG tablet Take 80 mg by mouth daily. 09/18/18  Yes [provider]  calcium acetate (PHOSLO) 667 MG capsule Take 1,334 mg by mouth 3 (three) times daily with meals.   Yes [provider]  clopidogrel (PLAVIX) 75 MG tablet Take 1 tablet (75 mg total) by mouth daily with breakfast. 10/02/14  Yes [name], MD  levETIRAcetam (KEPPRA) 100 MG/ML solution Take 5 mLs (500 mg total) by mouth daily. 05/03/19  Yes Spongberg, Audie Pinto, MD  losartan (COZAAR) 25 MG tablet  Take 1 tablet (25 mg total) by mouth daily. 09/18/16  Yes Demetrios Loll, MD  mem[facility]) 5 MG tablet Take 5 mg by mouth daily.    Yes [provider]  metoprolol succinate (TOPROL-XL) 25 MG 24 hr tablet Take 12.5-25 mg by mouth See admin instructions. Take 25 mg by mouth on non-dialysis days and 12.5 mg on dialysis days (Tuesday, Thursday and Saturday) after dialysis.    Yes [provider]  mirtazapine (REMERON) 15 MG tablet Take 15 mg by mouth at bedtime.   Yes [provider]  nitroGLYCERIN (NITROSTAT) 0.4 MG SL tablet Place 1 tablet (0.4 mg total) under the tongue every 5 (five) minutes x 3 doses as needed for chest pain. 02/26/18  Yes Demetrios Loll, MD    Physical Exam: Vitals:   05/27/19 0706 05/27/19 0707  BP: (!) 159/85   Pulse: 74   Resp: 19   Temp: (!) 97.5 F (36.4 C)   TempSrc: Oral   SpO2: 99%   Weight:  73.5 kg  Height:  5\' 4"  (1.626 m)      Constitutional: NAD, calm, comfortable-appears quite debilitated and somewhat toxic Eyes: PERRL, lids and conjunctivae normal ENMT: Mucous membranes are dry. Posterior pharynx clear of any exudate or lesions.Normal dentition.  Neck: normal, supple, no masses, no thyromegaly Respiratory: Coarse to auscultation bilaterally and somewhat diminished in the bases on anterior exam, no obvious wheezing, no crackles. Normal respiratory effort. No accessory muscle use. RA  Cardiovascular: Regular rate and rhythm, no murmurs / rubs / gallops. No extremity edema. 2+ pedal pulses. No carotid bruits.  Abdomen: no tenderness, no masses palpated. No hepatosplenomegaly. Bowel sounds positive.  Musculoskeletal: no clubbing / cyanosis. No joint deformity upper and lower extremities. Good ROM, no subtle left upper extremity contracture in context of known left hemiplegia. Normal muscle tone on right side.  Skin: no rashes, lesions, ulcers. No induration Neurologic: CN 2-12 grossly intact. Sensation intact, DTR normal. Strength  3/5 on right side, left hemiplegia Psychiatric: Awake and and oriented x name and place.  Flat affect   Labs on Admission: I have personally reviewed following labs and imaging studies  CBC: Recent Labs  Lab 05/27/19 0918  WBC 12.0*  HGB 10.6*  HCT 33.6*  MCV 73.0*  PLT 0000000   Basic Metabolic Panel: Recent Labs  Lab 05/27/19 0918  NA 136  K 5.0  CL 96*  CO2 28  GLUCOSE 117*  BUN 46*  CREATININE 11.24*  CALCIUM 9.4  MG 2.0  PHOS 1.9*   GFR: Estimated Creatinine Clearance: 5.9 mL/min (A) (by C-G formula based on SCr of 11.24 mg/dL (H)). Liver Function Tests: No results for input(s): AST, ALT, ALKPHOS, BILITOT, PROT, ALBUMIN in the last 168 hours. No results for input(s): LIPASE, AMYLASE in the last 168 hours. No results for input(s): AMMONIA in the last 168 hours. Coagulation Profile: No results for input(s): INR, PROTIME in the last 168 hours. Cardiac Enzymes: No results for input(s): CKTOTAL, CKMB, CKMBINDEX, TROPONINI in the last 168 hours. BNP (last 3 results) No results for input(s): PROBNP in the last 8760 hours. HbA1C: No results for input(s): HGBA1C in the last 72 hours. CBG: No results for input(s): GLUCAP in the last 168 hours. Lipid Profile: No results for input(s): CHOL, HDL, LDLCALC, TRIG, CHOLHDL, LDLDIRECT in the last 72 hours. Thyroid Function Tests: No results for input(s): TSH, T4TOTAL, FREET4, T3FREE, THYROIDAB in the last 72 hours. Anemia Panel: No results for input(s): VITAMINB12, FOLATE, FERRITIN, TIBC, IRON, RETICCTPCT in the last 72 hours. Urine analysis:    Component Value Date/Time   COLORURINE Straw 02/28/2014 1326   APPEARANCEUR Clear 02/28/2014 1326   LABSPEC 1.010 02/28/2014 1326   PHURINE 5.0 02/28/2014 1326   GLUCOSEU Negative 02/28/2014 1326   HGBUR 1+ 02/28/2014 1326   BILIRUBINUR Negative 02/28/2014 1326   KETONESUR Negative 02/28/2014 1326   PROTEINUR Negative 02/28/2014 1326   NITRITE Negative 02/28/2014 1326    LEUKOCYTESUR Negative 02/28/2014 1326   Sepsis Labs: @LABRCNTIP (procalcitonin:4,lacticidven:4) )No results found for this or any previous visit (from the past 240 hour(s)).   Radiological Exams on Admission: CT Head Wo Contrast  Result Date: 05/27/2019 CLINICAL DATA:  Weakness. EXAM: CT HEAD WITHOUT CONTRAST TECHNIQUE: Contiguous axial images were obtained from the base of the skull through the vertex without intravenous contrast. COMPARISON:  April 30, 2019. FINDINGS: Brain: Mild diffuse cortical atrophy is noted. Mild chronic ischemic white matter disease is noted. No mass effect or midline shift is noted. Ventricular size is within normal limits. There is no evidence of mass lesion, hemorrhage or acute infarction. Vascular: No hyperdense vessel or unexpected calcification. Skull: Normal. Negative for fracture or focal lesion. Sinuses/Orbits: No acute finding. Other: None. IMPRESSION: Mild diffuse cortical atrophy. Mild chronic ischemic white matter disease. No acute intracranial abnormality seen. Electronically Signed   By: Marijo Conception M.D.   On: 05/27/2019 08:51   DG Chest Port 1 View  Result Date: 05/27/2019 CLINICAL DATA:  Weakness. EXAM: PORTABLE CHEST 1 VIEW COMPARISON:  Chest radiograph 04/30/2019 FINDINGS: Mild cardiomegaly.  Sequela of prior median sternotomy. Opacity at the left lung base may reflect atelectasis, pneumonia and/or effusion. The right lung is clear. No evidence of pneumothorax. No acute bony abnormality Overlying cardiac monitoring leads. Partially visualized vascular stent within the left axilla. IMPRESSION: Mild cardiomegaly. Opacity at the left lung base may reflect atelectasis, pneumonia and/or effusion. Electronically Signed   By: Kellie Simmering DO   On: 05/27/2019 07:39    EKG: (Independently reviewed) normal sinus rhythm with ventricular rate 72 bpm, QTC 437 ms, normal R wave rotation, no acute ischemic changes appreciated  Assessment/Plan Principal  Problem:   Pneumonia Active Problems:   Dementia (HCC)   FTT (failure to thrive) in adult   Dysphagia   ESRD on dialysis ([place])   Type 1 diabetes mellitus with other diabetic kidney complication (HCC)   HTN (hypertension)   Anemia associated with chronic renal failure   Mixed hyperlipidemia   Seizure disorder ([place])  History of CVA (cerebrovascular accident)   Left lower lobar pneumonia -Presents from home with generalized malaise worse than baseline (unable to mobilize independently at home and primarily bedbound) -Chest x-ray in the ER concerning for possible left lobar pneumonia with associated mild leukocytosis-currently not hypoxemic -Procalcitonin 0.42 but unsure utility since recently treated for pneumonia several weeks ago and at time of discharge procalcitonin was 2.34 -Patient without overt signs of aspiration on previous swallowing evaluation last month a D3 diet with thin liquids was recommended-the patient's history of waxing and waning metabolic encephalopathy aspiration and differential -Initiate Zosyn pharmacy dosing-has received Rocephin and Zithromax in the ER -SARS-CoV-2 PCR pending-if positive will need to obtain inflammatory markers, begin steroids and evaluate for possible remdesivir-precaution airborne isolation has been initiated -For completeness of evaluation will obtain blood cultures  CKD 5 on chronic hemodialysis -Dialysis days TTS -Neurologist Dr. Candiss Norse made aware of admission-we will proceed with dialysis today if Covid testing is negative -Potassium borderline at 5.0 and otherwise patient does not appear to be significantly volume overloaded-he does have 1-2+ bilateral lower extremity edema but this may be chronic given his poor nutrition and bedbound state -Hepatitis B antibody positive  Dementia/failure to thrive in adult/mild dysphagia -Outpatient neurology documents multifactorial cognitive and impairment related to vascular dementia history of chronic  lacunar infarct -Swallowing evaluation last admission recommended D3 diet with thin liquids but to ensure all foods moistened-no straws -Patient severely debilitated and during previous admission SNF was recommended but family refused-he was made a DNR -Estimated body mass index is 27.81 kg/m as calculated from the following:   Height as of this encounter: 5\' 4"  (1.626 m).   Weight as of this encounter: 73.5 kg.   Diabetes mellitus 2 without current hyperglycemia -Patient has poor oral intake chronically therefore no longer requiring diabetes medications -Hemoglobin A1c November 9 was 5.8 which is more consistent with prediabetes as opposed to active diabetes  Known seizure disorder  -Patient presents with reported encephalopathy but was able to effectively answer questions follow commands during my evaluation -No reports of bowel or bladder incontinence but acute changes in mentation reported in triage could either be related to infectious etiology or possible undetected seizure activity -Levetiracetam level pending -Continue preadmission Keppra and give bolus/extra doses if level subtherapeutic   History of prior CVA with left hemiplegia -In review of outpatient neurology notes from August 2020 patient has a history of lacunar infarcts and chronic microvascular ischemic changes and was on dual therapy with low-dose aspirin and statin but on exam patient clearly has favored use of right side and was unable to lift left leg on command but was able to lift right leg, has some mild contracture of the left upper extremity and was unable to squeeze hand -Initial CT head unremarkable and given patient's chronic debilitated state if these are new neurological changes last known normal uncertain and unclear of utility of pursuing MRI at this juncture  **Additional lab, imaging and/or diagnostic evaluation at discretion of supervising physician  DVT prophylaxis: Subcu heparin Code Status:  DNR Family Communication: No family at bedside Disposition Plan: Patient, patient presents with altered mentation and diagnostic findings concerning for left lobar pneumonia of uncertain etiology ie aspiration/bacterial/Covid-patient will also require dialysis while in the hospital.  He has been started on empiric IV Zosyn to cover for possible aspiration.  Patient quite debilitated and previous admission SNF placement was recommended but family refused.  Discharging physician documented patient high risk for readmission Consults called: Nephrology-Dr.  Amie Portland ANP-BC [facility] Pager [phone]   If 7PM-7AM, please contact night-coverage [email or web address] Password [email or web address]  05/27/2019, 12:06 PM

## 2019-05-27 NOTE — Progress Notes (Signed)
Patient received from dialysis. Patient on stretcher. Responds to voice. When name called opens eyes briefly. Non verbal. Sleeping.

## 2019-05-27 NOTE — ED Notes (Signed)
X-ray at bedside

## 2019-05-27 NOTE — ED Notes (Signed)
Lab at bedside to get blood

## 2019-05-27 NOTE — ED Provider Notes (Addendum)
[facility] Emergency Department Provider Note  ____________________________________________   First MD Initiated Contact with Patient 05/27/19 [phone]     (approximate)  I have reviewed the triage vital signs and the nursing notes.  History  Chief Complaint Weakness    HPI [name] is a 68 y.o. male with history of CAD, ischemic cardiomyopathy, EF 30 to 35%, status post failed renal transplant, ESRD (TTS), history CVA, dementia who presents from home for generalized weakness. Patient with history of dementia and non-verbal at baseline. Did not dialyze today.  Per wife/daughter via phone: they noticed the patient becoming more generally weak over the last day or so.  This morning, when they tried to get him in the car to take him to dialysis, they were unable to transfer him.  They deny any focal lateralizing weakness.  They report chronic cough.  No known fevers, vomiting, diarrhea.  No sick contacts.  Last dialyzed on Tuesday.  He does not make urine.  At baseline he is minimally interactive.  Family confirm he is DNR status.   Caveat: history primarily obtained from chart review and family, as patient is minimally interactive at baseline.   Past Medical Hx Past Medical History:  Diagnosis Date  . Chronic diastolic congestive heart failure ([place])   . Chronic disease anemia   . ESRD (end stage renal disease) on dialysis ([place])    "[place]; [place]; [name]" (09/29/2014)  . GERD (gastroesophageal reflux disease)   . Gout   . High cholesterol   . History of blood transfusion    "related to anemia"  . History of stomach ulcers   . Hypertension   . Type II diabetes mellitus ([place])     Problem List Patient Active Problem List   Diagnosis Date Noted  . Dementia without behavioral disturbance ([place]) 05/01/2019  . Seizure ([place]) 05/01/2019  . Acute metabolic encephalopathy 123XX123  . Pneumonia 04/26/2019  . Community acquired pneumonia 11/13/2018  .  CVA (cerebral vascular accident) ([place])   . Palliative care by specialist   . Acute encephalopathy 02/19/2018  . Calculus of common duct   . Fitting and adjustment of gastrointestinal appliance and device   . Elevated LFTs 09/16/2016  . Calculus of bile duct without cholecystitis with obstruction   . Choledocholithiasis   . Diabetes ([place]) 09/04/2016  . HTN (hypertension) 09/04/2016  . GERD (gastroesophageal reflux disease) 09/04/2016  . Chronic diastolic CHF (congestive heart failure) ([place]) 09/04/2016  . Transaminitis 09/04/2016  . Sepsis ([place]) 09/04/2016  . CAP (community acquired pneumonia) 09/04/2016  . Encephalopathy acute   . HCAP (healthcare-associated pneumonia) 07/24/2016  . Bradycardia 06/25/2016  . Alteration in self-care ability 03/05/2016  . Hx of subdural hematoma 03/05/2016  . 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 NSTEMI (non-ST elevated myocardial infarction) ([place]) 02/26/2016  . H/O non-ST elevation myocardial infarction (NSTEMI) 01/10/2016  . S/P coronary artery stent placement 01/10/2016  . Depression, major, single episode, complete remission ([place]) 07/22/2015  . Acute respiratory failure with hypoxia ([place]) 02/14/2015  . Syncope 11/05/2014  . Acute cystitis with hematuria 10/11/2014  . Acute subdural hematoma ([place]) 10/10/2014  . Anemia in chronic kidney disease (CKD) 10/07/2014  . ESRD on hemodialysis ([place]) 10/07/2014  . Mixed hyperlipidemia 10/07/2014  . 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 Contaminant given to patient   . Shortness of breath   . Viridans streptococci infection   . Polymicrobial bacterial infection   . Bacteremia   . Acute and subacute infective endocarditis in diseases  classified elsewhere   . ESRD on dialysis ([place])   . Type 1 diabetes mellitus with other diabetic kidney complication ([place])   . Acute coronary syndrome ([place]) 09/29/2014  . Anemia associated with chronic renal failure 03/25/2014  . Nausea & vomiting 03/25/2014  . Gout 03/17/2012  . H/O:  upper GI bleed 03/17/2012  . Hyperlipidemia, unspecified 03/17/2012  . Kidney transplant status, cadaveric 07/16/2002    Past Surgical Hx Past Surgical History:  Procedure Laterality Date  . ARTERIOVENOUS GRAFT PLACEMENT Left ~ 1996  . CARDIAC CATHETERIZATION  09/29/2014   "[place]"  . CARDIAC CATHETERIZATION N/A 02/26/2016   Procedure: Left Heart Cath and Coronary Angiography Possible PCI;  Surgeon: [name], MD;  Location: [place] CV LAB;  Service: Cardiovascular;  Laterality: N/A;  . ENDOSCOPIC RETROGRADE CHOLANGIOPANCREATOGRAPHY (ERCP) WITH PROPOFOL N/A 09/17/2016   Procedure: ENDOSCOPIC RETROGRADE CHOLANGIOPANCREATOGRAPHY (ERCP) WITH PROPOFOL;  Surgeon: [name], MD;  Location: [facility] ENDOSCOPY;  Service: Endoscopy;  Laterality: N/A;  . ERCP N/A 09/10/2016   Procedure: ENDOSCOPIC RETROGRADE CHOLANGIOPANCREATOGRAPHY (ERCP);  Surgeon: [name], MD;  Location: [facility] ENDOSCOPY;  Service: Endoscopy;  Laterality: N/A;  . KIDNEY TRANSPLANT Right 2004  . NEPHRECTOMY TRANSPLANTED ORGAN  2015  . PERCUTANEOUS CORONARY STENT INTERVENTION (PCI-S) N/A 09/30/2014   Procedure: PERCUTANEOUS CORONARY STENT INTERVENTION (PCI-S);  Surgeon: [name], MD;  [facility] C[facility] LAB;  Service: Cardiovascular;  Laterality: N/A;  . PERITONEAL CATHETER INSERTION  02/2014  . PERITONEAL CATHETER REMOVAL  08/2014  . THROMBECTOMY / ARTERIOVENOUS GRAFT REVISION  2015    Medications Prior to Admission medications   Medication Sig Start Date End Date Taking? Authorizing Provider  allopurinol (ZYLOPRIM) 100 MG tablet Take 100 mg by mouth daily. 09/23/14   [provider]  aspirin 81 MG EC tablet Take 81 mg by mouth daily. 09/23/14   [provider]  atorvastatin (LIPITOR) 80 MG tablet Take 80 mg by mouth daily. 09/18/18   [provider]  calcium acetate (PHOSLO) 667 MG capsule Take 1,334 mg by mouth 3 (three) times daily with meals.    [provider]  clopidogrel  (PLAVIX) 75 MG tablet Take 1 tablet (75 mg total) by mouth daily with breakfast. 10/02/14   [name], MD  levETIRAcetam (KEPPRA) 100 MG/ML solution Take 5 mLs (500 mg total) by mouth daily. 05/03/19   Marcell Anger, MD  levETIRAcetam (KEPPRA) 100 MG/ML solution Take 2.5 mLs (250 mg total) by mouth Every Tuesday,Thursday,and Saturday with dialysis. 05/04/19   Spongberg, Audie Pinto, MD  losartan (COZAAR) 25 MG tablet Take 1 tablet (25 mg total) by mouth daily. 09/18/16   Demetrios Loll, MD  memantine [facility]) 5 MG tablet Take 10 mg by mouth daily.    [provider]  metoprolol succinate (TOPROL-XL) 25 MG 24 hr tablet Take 12.5-25 mg by mouth See admin instructions. Take 25 mg by mouth on non-dialysis days and 12.5 mg on dialysis days (Tuesday, Thursday and Saturday) after dialysis.     [provider]  mirtazapine (REMERON) 15 MG tablet Take 15 mg by mouth at bedtime.    [provider]  nitroGLYCERIN (NITROSTAT) 0.4 MG SL tablet Place 1 tablet (0.4 mg total) under the tongue every 5 (five) minutes x 3 doses as needed for chest pain. 02/26/18   Demetrios Loll, MD    Allergies Ivp dye [iodinated diagnostic agents] and Metrizamide  Family Hx Family History  Problem Relation Age of Onset  . Hypertension Mother   . Stroke Mother   .  Hypertension Father   . Diabetes Mellitus II Father   . Asthma Father     Social Hx Social History   Tobacco Use  . Smoking status: Former Smoker    Types: Cigarettes  . Smokeless tobacco: Never Used  . Tobacco comment: "quit smoking cigarettes in the 1980's"  Substance Use Topics  . Alcohol use: No  . Drug use: No     Review of Systems  Constitutional: Negative for fever, chills. + generalized weakness Eyes: Negative for visual changes. ENT: Negative for sore throat. Cardiovascular: Negative for chest pain. Respiratory: Negative for shortness of breath. + cough Gastrointestinal: Negative for nausea, vomiting.   Genitourinary: Negative for dysuria. Musculoskeletal: Negative for leg swelling. Skin: Negative for rash. Neurological: Negative for for headaches.   Physical Exam  Vital Signs: ED Triage Vitals  Enc Vitals Group     BP 05/27/19 0706 (!) 159/85     Pulse Rate 05/27/19 0706 74     Resp 05/27/19 0706 19     Temp 05/27/19 0706 (!) 97.5 F (36.4 C)     Temp Source 05/27/19 0706 Oral     SpO2 05/27/19 0706 99 %     Weight 05/27/19 0707 162 lb (73.5 kg)     Height 05/27/19 0707 5\' 4"  (1.626 m)     Head Circumference --      Peak Flow --      Pain Score --      Pain Loc --      Pain Edu? --      Excl. in [place]? --     Constitutional: Awake. Answers to name. Oriented to name and place. Otherwise minimally interactive/non-participatory.  Head: Normocephalic. Atraumatic. Eyes: Conjunctivae clear. Sclera anicteric. Nose: No congestion. No rhinorrhea. Mouth/Throat: Wearing mask.  Neck: No stridor.   Cardiovascular: Normal rate, regular rhythm. Extremities well perfused.  Dialysis fistula in left forearm with palpable pulse. Respiratory: Normal respiratory effort.  Decreased breath sounds at left base. Gastrointestinal: Soft.  Seemingly non-tender. Non-distended.  Musculoskeletal: No lower extremity edema. No deformities. Neurologic: Oriented to name and place.  Otherwise not particularly interactive. Skin: Skin is warm, dry and intact. No rash noted. Psychiatric: Calm and pleasant.  EKG  Personally reviewed.   Rate: 72 Rhythm: sinus Axis: normal Intervals: WNL Significant artifact, no acute ischemic changes.  No peaked T waves No STEMI    Radiology  CXR: IMPRESSION:  Mild cardiomegaly.   Opacity at the left lung base may reflect atelectasis, pneumonia  and/or effusion.   CT head: IMPRESSION: Mild diffuse cortical atrophy. Mild chronic ischemic white matter disease. No acute intracranial abnormality seen.     Procedures  Procedure(s) performed (including  critical care):  Procedures   Initial Impression / Assessment and Plan / ED Course  68 y.o. male with past medical history as above who presents to the ED for generalized weakness.  Ddx: infection, electrolyte abnormality, anemia, uremia, generalized decline related to his dementia, medication side effect  Will obtain labs, EKG, XR, reassess  Work-up concerning for pneumonia of the left lung base - no oxygen requirement at this time.  Mild leukocytosis to 12 and procalcitonin of 0.42.  We will plan to treat with ceftriaxone, azithromycin.  He may require evaluation for possible aspiration pneumonia given his history of CVA and now seemingly history of recurrent pneumonia (admitted for similar in the beginning of November).  We will plan to admit for further treatment.  Discussed with hospitalist for admission. They are aware he  has not dialyzed yet today, though he does not seem to need emergent dialysis at this time (no hypoxia, no significant hypertension, no hyperkalemia). Update family via phone, who are comfortable with plan.    Final Clinical Impression(s) / ED Diagnosis  Final diagnoses:  Generalized weakness       Note:  This document was prepared using Dragon voice recognition software and may include unintentional dictation errors.     Lilia Pro., MD 05/27/19 [phone]

## 2019-05-27 NOTE — Progress Notes (Signed)
Post HD Tx assessment   05/27/19 2110  Neurological  Level of Consciousness Responds to Voice  Orientation Level Oriented to person  Respiratory  Respiratory Pattern Regular;Unlabored  Chest Assessment Chest expansion symmetrical  Bilateral Breath Sounds Diminished  Cardiac  Pulse Regular  Heart Sounds S1, S2  ECG Monitor Yes  Vascular  R Radial Pulse +2  L Radial Pulse +2  Integumentary  Integumentary (WDL) WDL  Musculoskeletal  Musculoskeletal (WDL) X  Generalized Weakness Yes  Gastrointestinal  Bowel Sounds Assessment Hypoactive  Psychosocial  Psychosocial (WDL) X  Patient Behaviors Anxious;Flat affect;Sad;Withdrawn;Tearful  Needs Expressed Emotional  Emotional support given Given to patient

## 2019-05-28 DIAGNOSIS — Z992 Dependence on renal dialysis: Secondary | ICD-10-CM

## 2019-05-28 DIAGNOSIS — G40909 Epilepsy, unspecified, not intractable, without status epilepticus: Secondary | ICD-10-CM

## 2019-05-28 DIAGNOSIS — R1310 Dysphagia, unspecified: Secondary | ICD-10-CM

## 2019-05-28 DIAGNOSIS — R531 Weakness: Secondary | ICD-10-CM | POA: Insufficient documentation

## 2019-05-28 DIAGNOSIS — N186 End stage renal disease: Secondary | ICD-10-CM

## 2019-05-28 DIAGNOSIS — R627 Adult failure to thrive: Secondary | ICD-10-CM

## 2019-05-28 MED ORDER — SODIUM CHLORIDE 0.9 % IV SOLN
3.0000 g | INTRAVENOUS | Status: DC
  Administered 2019-05-28 – 2019-05-31 (×4): 3 g via INTRAVENOUS
  Filled 2019-05-28 (×3): qty 3
  Filled 2019-05-28 (×4): qty 8

## 2019-05-28 MED ORDER — EPOETIN ALFA 4000 UNIT/ML IJ SOLN
4000.0000 [IU] | INTRAMUSCULAR | Status: DC
  Administered 2019-05-29: 4000 [IU] via INTRAVENOUS
  Filled 2019-05-28 (×2): qty 1

## 2019-05-28 NOTE — Progress Notes (Signed)
Established patient known at [facility] ([place] TTS 06:30, family normally transports patient to treatments. Please contact me any dialysis placement concerns.  Elvera Bicker Dialysis Coordinator [phone]

## 2019-05-28 NOTE — ED Notes (Signed)
Pt' daughter leaving at this time, informed RN that pt speaks some [place].

## 2019-05-28 NOTE — NC FL2 (Signed)
[place] LEVEL OF CARE SCREENING TOOL     IDENTIFICATION  Patient Name: [name] Birthdate: 1951/01/16 Sex: male Admission Date (Current Location): 05/27/2019  [place] and [place] Number:  Engineering geologist and Address:  [facility], [address] [place], [place] [address]      Provider Number: [id number]  Attending Physician Name and Address:  Lorella Nimrod, MD  Relative Name and Phone Number:  Dyquan Krimmer [phone]    Current Level of Care: Hospital Recommended Level of Care: [place] Prior Approval Number:    Date Approved/Denied:   PASRR Number: [id number]  Discharge Plan: SNF    Current Diagnoses: Patient Active Problem List   Diagnosis Date Noted  . Generalized weakness   . FTT (failure to thrive) in adult 05/27/2019  . Dysphagia 05/27/2019  . History of CVA (cerebrovascular accident) 05/27/2019  . Dementia ([place]) 05/01/2019  . Seizure ([place]) 05/01/2019  . Acute metabolic encephalopathy 123XX123  . Pneumonia 04/26/2019  . Community acquired pneumonia 11/13/2018  . CVA (cerebral vascular accident) ([place])   . Palliative care by specialist   . Seizure disorder ([place]) 02/19/2018  . Calculus of common duct   . Fitting and adjustment of gastrointestinal appliance and device   . Elevated LFTs 09/16/2016  . Calculus of bile duct without cholecystitis with obstruction   . Choledocholithiasis   . Diabetes ([place]) 09/04/2016  . HTN (hypertension) 09/04/2016  . GERD (gastroesophageal reflux disease) 09/04/2016  . Chronic diastolic CHF (congestive heart failure) ([place]) 09/04/2016  . Transaminitis 09/04/2016  . Sepsis ([place]) 09/04/2016  . CAP (community acquired pneumonia) 09/04/2016  . Encephalopathy acute   . HCAP (healthcare-associated pneumonia) 07/24/2016  . Bradycardia 06/25/2016  . Alteration in self-care ability 03/05/2016  . Hx of subdural hematoma 03/05/2016  . 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  NSTEMI (non-ST elevated myocardial infarction) ([place]) 02/26/2016  . H/O non-ST elevation myocardial infarction (NSTEMI) 01/10/2016  . S/P coronary artery stent placement 01/10/2016  . Depression, major, single episode, complete remission ([place]) 07/22/2015  . Acute respiratory failure with hypoxia ([place]) 02/14/2015  . Syncope 11/05/2014  . Acute cystitis with hematuria 10/11/2014  . Acute subdural hematoma ([place]) 10/10/2014  . Anemia in chronic kidney disease (CKD) 10/07/2014  . ESRD on hemodialysis ([place]) 10/07/2014  . Mixed hyperlipidemia 10/07/2014  . 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 Contaminant given to patient   . Shortness of breath   . Viridans streptococci infection   . Polymicrobial bacterial infection   . Bacteremia   . 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 ESRD on dialysis ([place])   . Type 1 diabetes mellitus with other diabetic kidney complication ([place])   . Acute coronary syndrome ([place]) 09/29/2014  . Anemia associated with chronic renal failure 03/25/2014  . Nausea & vomiting 03/25/2014  . Gout 03/17/2012  . H/O: upper GI bleed 03/17/2012  . Hyperlipidemia, unspecified 03/17/2012  . Kidney transplant status, cadaveric 07/16/2002    Orientation RESPIRATION BLADDER Height & Weight     Self, Time  Normal Continent Weight: 73.5 kg Height:  5\' 4"  (162.6 cm)  BEHAVIORAL SYMPTOMS/MOOD NEUROLOGICAL BOWEL NUTRITION STATUS      Continent Diet  AMBULATORY STATUS COMMUNICATION OF NEEDS Skin     Non-Verbally(at baseline) Normal                       Personal Care Assistance Level of Assistance  Bathing, Feeding Bathing  Assistance: Limited assistance Feeding assistance: Limited assistance Dressing Assistance: Limited assistance     Functional Limitations Info             SPECIAL CARE FACTORS FREQUENCY                       Contractures Contractures Info: Not present    Additional Factors Info  Code Status,  Allergies Code Status Info: DNR Allergies Info: IVP Dye and Metrizamide           Current Medications (05/28/2019):  This is the current hospital active medication list Current Facility-Administered Medications  Medication Dose Route Frequency Provider Last Rate Last Admin  . acetaminophen (TYLENOL) tablet 650 mg  650 mg Oral Q6H PRN Samella Parr, NP       Or  . acetaminophen (TYLENOL) suppository 650 mg  650 mg Rectal Q6H PRN Samella Parr, NP      . allopurinol (ZYLOPRIM) tablet 100 mg  100 mg Oral Daily Samella Parr, NP   100 mg at 05/28/19 1202  . Ampicillin-Sulbactam (UNASYN) 3 g in sodium chloride 0.9 % 100 mL IVPB  3 g Intravenous Q24H Lu Duffel, RPH      . aspirin EC tablet 81 mg  81 mg Oral Daily Samella Parr, NP   81 mg at 05/28/19 1202  . atorvastatin (LIPITOR) tablet 80 mg  80 mg Oral Daily Samella Parr, NP   80 mg at 05/28/19 1203  . calcium acetate (PHOSLO) capsule 1,334 mg  1,334 mg Oral TID WC Samella Parr, NP   1,334 mg at 05/28/19 1201  . Chlorhexidine Gluconate Cloth 2 % PADS 6 each  6 each Topical Q0600 Murlean Iba, MD      . clopidogrel (PLAVIX) tablet 75 mg  75 mg Oral Q breakfast Samella Parr, NP   75 mg at 05/28/19 1201  . [START ON 05/29/2019] epoetin alfa (EPOGEN) injection 4,000 Units  4,000 Units Intravenous Q T,Th,Sa-HD Singh, Harmeet, MD      . heparin injection 5,000 Units  5,000 Units Subcutaneous Q8H Samella Parr, NP   5,000 Units at 05/28/19 1345  . levETIRAcetam (KEPPRA) 100 MG/ML solution 500 mg  500 mg Oral Daily Samella Parr, NP   500 mg at 05/28/19 1203  . losartan (COZAAR) tablet 25 mg  25 mg Oral Daily Samella Parr, NP   25 mg at 05/28/19 1201  . memantine (NAMENDA) tablet 5 mg  5 mg Oral Daily Samella Parr, NP   5 mg at 05/28/19 1201  . metoprolol succinate (TOPROL-XL) 24 hr tablet 12.5 mg  12.5 mg Oral Once per day on Tue Thu Sat Samella Parr, NP   12.5 mg at 05/27/19 1601  . metoprolol  succinate (TOPROL-XL) 24 hr tablet 25 mg  25 mg Oral Once per day on Sun Mon Wed Fri Gherghe, Costin M, MD      . mirtazapine (REMERON) tablet 15 mg  15 mg Oral QHS Samella Parr, NP      . ondansetron [facility]) tablet 4 mg  4 mg Oral Q6H PRN Samella Parr, NP       Or  . ondansetron [facility]) injection 4 mg  4 mg Intravenous Q6H PRN Samella Parr, NP      . sodium chloride flush (NS) 0.9 % injection 3 mL  3 mL Intravenous Q12H Samella Parr, NP   3 mL at 05/28/19  1204   Current Outpatient Medications  Medication Sig Dispense Refill  . allopurinol (ZYLOPRIM) 100 MG tablet Take 100 mg by mouth daily.  10  . aspirin 81 MG EC tablet Take 81 mg by mouth daily.  10  . atorvastatin (LIPITOR) 80 MG tablet Take 80 mg by mouth daily.    . calcium acetate (PHOSLO) 667 MG capsule Take 1,334 mg by mouth 3 (three) times daily with meals.    . clopidogrel (PLAVIX) 75 MG tablet Take 1 tablet (75 mg total) by mouth daily with breakfast. 30 tablet 11  . levETIRAcetam (KEPPRA) 100 MG/ML solution Take 5 mLs (500 mg total) by mouth daily. 473 mL 12  . losartan (COZAAR) 25 MG tablet Take 1 tablet (25 mg total) by mouth daily. 30 tablet 2  . memantine (NAMENDA) 5 MG tablet Take 5 mg by mouth daily.     . metoprolol succinate (TOPROL-XL) 25 MG 24 hr tablet Take 12.5-25 mg by mouth See admin instructions. Take 25 mg by mouth on non-dialysis days and 12.5 mg on dialysis days (Tuesday, Thursday and Saturday) after dialysis.     Marland Kitchen mirtazapine (REMERON) 15 MG tablet Take 15 mg by mouth at bedtime.    . nitroGLYCERIN (NITROSTAT) 0.4 MG SL tablet Place 1 tablet (0.4 mg total) under the tongue every 5 (five) minutes x 3 doses as needed for chest pain.  12     Discharge Medications: Please see discharge summary for a list of discharge medications.  Relevant Imaging Results:  Relevant Lab Results:   Additional Information SSN#: [id number]  Trecia Rogers, LCSW

## 2019-05-28 NOTE — ED Notes (Signed)
Spoke with pt son via speaker phone and updated him on pt status, pt does need language line for interpretation. Son states pt daughter is coming to visit with the pt.

## 2019-05-28 NOTE — Progress Notes (Signed)
PROGRESS NOTE    [name]  [id number] DOB: Apr 20, 1951 DOA: 05/27/2019 PCP: Kirk Ruths, MD   Brief Narrative:  [name] is a 68 y.o. 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  Patient was recently discharged on 11/15 after an admission for right lower lobe pneumonia with sepsis physiology.  Due to patient's significant physical debility and recurrent encephalopathy extensive discussions were held with the family guarding placement in skilled nursing facility but they declined.  It was documented that patient will be high risk for admission with significant needs at home if patient's family continue to decline SNF.  Patient presented to ED from home with generalized weakness. Having mild leukocytosis, procalcitonin of 0.42, COVID-19 negative and chest x-ray with persistent left lower lobe infiltrate.  Subjective: Patient appears very drowsy when seen this morning.  He was just opening eyes on command and not answering any questions.  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  Patient is primarily bedbound and require a lot of help for mobilization even transferring from bed to chair. Chest x-ray with persistent left lower lobe pneumonia.  There was some concern for aspiration, aspiration pneumonia mostly affects right middle to lower lobe as it is postural dependent.  Concern for some obstruction.  And has a CT chest done in May 2020 which also shows left lower lobe infiltrate/scarring. Patient to have mild leukocytosis and procalcitonin is mildly elevated although much improved from his prior numbers. Patient has a very poor baseline functional status.  Do not know how beneficial it is to involve pulmonary for a possible bronchoscopy. Per chart review patient has similar appearances on multiple occasions.  Talked with his nephrologist Dr. Candiss Norse who also told me that patient appears like this on multiple occasions for dialysis.  There are days when he is little more alert, otherwise mostly very sleepy and tired and does not communicate well. He was started on Zosyn yesterday. -Discontinue Zosyn. -Start him on Unasyn. -Palliative care consult to discuss further goals of care as of his poor functional status. -PT/OT evaluation.  ESRD (TTS).  Patient did receive dialysis yesterday.  Clinically does not appear volume overload.  No morning labs as patient is hard stick and unable to obtain a sample after multiple attempts. -Continue with his scheduled dialysis.  Dementia/failure to thrive in adult/mild dysphagia. 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 -Palliative care consult was placed.  Diabetes mellitus 2 without current hyperglycemia -Patient has poor oral intake chronically therefore no longer requiring diabetes medications -Hemoglobin A1c November 9 was 5.8 which is more consistent with prediabetes. CBG remained between 99-150. -Continue to monitor.  Seizure disorder.  No witnessed  seizure-like activity. Prior EEGs during  previous admission shows mild to moderate encephalopathy without any seizure-like activity. -Keppra level still pending. -Continue Keppra.  History of prior CVA with left hemiplegia.  No new focal deficit.  Repeat CT head yesterday was without any acute intracranial abnormality.  Objective: Vitals:   05/28/19 0001 05/28/19 0200 05/28/19 0400 05/28/19 0910  BP: (!) 137/100 116/84 105/85 115/84  Pulse:      Resp: 19 20 17 18   Temp:    98.3 F (36.8 C)  TempSrc:      SpO2: 99% 99% 94% 100%  Weight:      Height:        Intake/Output Summary (Last 24 hours) at 05/28/2019 1239 Last data filed at 05/27/2019 2115 Gross per 24 hour  Intake 250 ml  Output 500 ml  Net -250 ml   Filed Weights   05/27/19 0707 05/27/19 1750  Weight: 73.5 kg 73.5 kg    Examination:  General exam: Chronically ill-appearing gentleman, appears drowsy and tired, open eyes on command, no other response, in no acute distress. Respiratory system: Clear to auscultation. Respiratory effort normal. Cardiovascular system: S1 & S2 heard, RRR. No JVD, murmurs, rubs, gallops or clicks.  Gastrointestinal system: Abdomen is nondistended, soft and nontender. No organomegaly or masses felt. Normal bowel sounds heard. Central nervous system: Unable to assess as patient was not following any command. Extremities: Trace lower extremity edema, pulses intact and symmetrical. Psychiatry: Patient appears drowsy and tired.  DVT prophylaxis: Heparin Code Status: DNR Family Communication: Talked with his son [name] on phone to update about patient.  Patient has a a lot of decline in his functional status over the past few months.  Complains of coughing with eating.  He requires multiple family members to mobilize or help with feeding.  He does not communicate most of the time.  That we are involving palliative care and they will give him a call.  He will discuss his goals of care with family. Disposition Plan: Pending  improvement/family decision.  Consultants:   Nephrology  Palliative  Procedures:  Antimicrobials:  Unasyn.  Data Reviewed: I have personally reviewed following labs and imaging studies  CBC: Recent Labs  Lab 05/27/19 0918 05/27/19 1613  WBC 12.0* 8.9  HGB 10.6* 9.9*  HCT 33.6* 31.8*  MCV 73.0* 74.1*  PLT 175 [id number]   Basic Metabolic Panel: Recent Labs  Lab 05/27/19 0918 05/27/19 1613  NA 136  --   K 5.0  --   CL 96*  --   CO2 28  --   GLUCOSE 117*  --   BUN 46*  --   CREATININE 11.24* 11.58*  CALCIUM 9.4  --   MG 2.0 2.1  PHOS 1.9* 2.0*   GFR: Estimated Creatinine Clearance: 5.7 mL/min (A) (by C-G formula based on SCr of 11.58 mg/dL (H)). Liver Function Tests: No results for input(s): AST, ALT, ALKPHOS, BILITOT, PROT, ALBUMIN in the last 168 hours. No results for input(s): LIPASE, AMYLASE in the last 168 hours. No results for input(s): AMMONIA in the last 168 hours. Coagulation Profile: No results for input(s): INR, PROTIME in the last 168 hours. Cardiac Enzymes: No results for input(s): CKTOTAL, CKMB, CKMBINDEX, TROPONINI in the last 168 hours. BNP (last 3 results) No results for input(s): PROBNP in the last 8760 hours. HbA1C: No results for input(s): HGBA1C in the last 72 hours. CBG: No results for input(s): GLUCAP in the last 168 hours. Lipid Profile: No results for input(s): CHOL, HDL,  LDLCALC, TRIG, CHOLHDL, LDLDIRECT in the last 72 hours. Thyroid Function Tests: No results for input(s): TSH, T4TOTAL, FREET4, T3FREE, THYROIDAB in the last 72 hours. Anemia Panel: No results for input(s): VITAMINB12, FOLATE, FERRITIN, TIBC, IRON, RETICCTPCT in the last 72 hours. Sepsis Labs: Recent Labs  Lab 05/27/19 0918  PROCALCITON 0.42    Recent Results (from the past 240 hour(s))  SARS CORONAVIRUS 2 (TAT 6-24 HRS) Nasopharyngeal Nasopharyngeal Swab     Status: None   Collection Time: 05/27/19  9:54 AM   Specimen: Nasopharyngeal Swab  Result Value Ref  Range Status   SARS Coronavirus 2 NEGATIVE NEGATIVE Final    Comment: (NOTE) SARS-CoV-2 target nucleic acids are NOT DETECTED. The SARS-CoV-2 RNA is generally detectable in upper and lower respiratory specimens during the acute phase of infection. Negative results do not preclude SARS-CoV-2 infection, do not rule out co-infections with other pathogens, and should not be used as the sole basis for treatment or other patient management decisions. Negative results must be combined with clinical observations, patient history, and epidemiological information. The expected result is Negative. Fact Sheet for Patients: SugarRoll.be Fact Sheet for Healthcare Providers: https://www.woods-mathews.com/ This test is not yet approved or cleared by the [place] FDA and  has been authorized for detection and/or diagnosis of SARS-CoV-2 by FDA under an Emergency Use Authorization (EUA). This EUA will remain  in effect (meaning this test can be used) for the duration of the COVID-19 declaration under Section 56 4(b)(1) of the Act, 21 U.S.C. section 360bbb-3(b)(1), unless the authorization is terminated or revoked sooner. Performed at [facility], [place] [address] [place], [place] [address]   Culture, blood (routine x 2)     Status: None (Preliminary result)   Collection Time: 05/27/19  4:13 PM   Specimen: BLOOD  Result Value Ref Range Status   Specimen Description BLOOD BLOOD RIGHT HAND  Final   Special Requests   Final    BOTTLES DRAWN AEROBIC AND ANAEROBIC Blood Culture adequate volume   Culture   Final    NO GROWTH < 12 HOURS Performed at [facility], [address], [place], [place] [address]    Report Status PENDING  Incomplete  Culture, blood (routine x 2)     Status: None (Preliminary result)   Collection Time: 05/27/19  4:34 PM   Specimen: BLOOD  Result Value Ref Range Status   Specimen Description BLOOD RIGHT ANTECUBITAL   Final   Special Requests   Final    BOTTLES DRAWN AEROBIC AND ANAEROBIC Blood Culture adequate volume   Culture   Final    NO GROWTH < 12 HOURS Performed at [facility], [address], [place], [place] [address]    Report Status PENDING  Incomplete     Radiology Studies: CT Head Wo Contrast  Result Date: 05/27/2019 CLINICAL DATA:  Weakness. EXAM: CT HEAD WITHOUT CONTRAST TECHNIQUE: Contiguous axial images were obtained from the base of the skull through the vertex without intravenous contrast. COMPARISON:  April 30, 2019. FINDINGS: Brain: Mild diffuse cortical atrophy is noted. Mild chronic ischemic white matter disease is noted. No mass effect or midline shift is noted. Ventricular size is within normal limits. There is no evidence of mass lesion, hemorrhage or acute infarction. Vascular: No hyperdense vessel or unexpected calcification. Skull: Normal. Negative for fracture or focal lesion. Sinuses/Orbits: No acute finding. Other: None. IMPRESSION: Mild diffuse cortical atrophy. Mild chronic ischemic white matter disease. No acute intracranial abnormality seen. Electronically Signed  By: Marijo Conception M.D.   On: 05/27/2019 08:51   DG Chest Port 1 View  Result Date: 05/27/2019 CLINICAL DATA:  Weakness. EXAM: PORTABLE CHEST 1 VIEW COMPARISON:  Chest radiograph 04/30/2019 FINDINGS: Mild cardiomegaly.  Sequela of prior median sternotomy. Opacity at the left lung base may reflect atelectasis, pneumonia and/or effusion. The right lung is clear. No evidence of pneumothorax. No acute bony abnormality Overlying cardiac monitoring leads. Partially visualized vascular stent within the left axilla. IMPRESSION: Mild cardiomegaly. Opacity at the left lung base may reflect atelectasis, pneumonia and/or effusion. Electronically Signed   By: Kellie Simmering DO   On: 05/27/2019 07:39    Scheduled Meds: . allopurinol  100 mg Oral Daily  . aspirin EC  81 mg Oral Daily  . atorvastatin  80 mg Oral  Daily  . calcium acetate  1,334 mg Oral TID WC  . Chlorhexidine Gluconate Cloth  6 each Topical Q0600  . clopidogrel  75 mg Oral Q breakfast  . heparin  5,000 Units Subcutaneous Q8H  . levETIRAcetam  500 mg Oral Daily  . losartan  25 mg Oral Daily  . memantine  5 mg Oral Daily  . metoprolol succinate  12.5 mg Oral Once per day on Tue Thu Sat  . metoprolol succinate  25 mg Oral Once per day on Sun Mon Wed Fri  . mirtazapine  15 mg Oral QHS  . sodium chloride flush  3 mL Intravenous Q12H   Continuous Infusions: . piperacillin-tazobactam (ZOSYN)  IV Stopped (05/28/19 0712)     LOS: 1 day   Time spent: 45 minutes.  I personally reviewed his chart.  Lorella Nimrod, MD [facility] Pager [phone]  If 7PM-7AM, please contact night-coverage [email or web address] Password [facility] 05/28/2019, 12:39 PM   This record has been created using Dragon voice recognition software. Errors have been sought and corrected,but may not always be located. Such creation errors do not reflect on the standard of care.

## 2019-05-28 NOTE — Progress Notes (Signed)
[place], [place] 05/28/19  Subjective:   LOS: 1 12/10 0701 - 12/11 0700 In: 350 [IV Piggyback:350] Out: 500  Patient presented to the emergency room for weakness.  Recent hospitalization in November for right lower lobe pneumonia.  Admitted for possible aspiration component.  . More alert today States he is hungry  Objective:  Vital signs in last 24 hours:  Temp:  [97.7 F (36.5 C)-98.8 F (37.1 C)] 98.3 F (36.8 C) (12/11 0910) Pulse Rate:  [61-79] 75 (12/10 2149) Resp:  [17-24] 18 (12/11 0910) BP: (73-188)/(47-100) 115/84 (12/11 0910) SpO2:  [94 %-100 %] 100 % (12/11 0910) Weight:  [73.5 kg] 73.5 kg (12/10 1750)  Weight change:  Filed Weights   05/27/19 0707 05/27/19 1750  Weight: 73.5 kg 73.5 kg    Intake/Output:    Intake/Output Summary (Last 24 hours) at 05/28/2019 1259 Last data filed at 05/27/2019 2115 Gross per 24 hour  Intake 250 ml  Output 500 ml  Net -250 ml     Physical Exam: General: NAD  HEENT Anicteric, moist oral mucous membranes  Pulm/lungs  normal breathing effort on room air, clear to auscultation  CVS/Heart  no rub or gallop  Abdomen:   Soft, nontender  Extremities:  No peripheral edema  Neurologic:  Lethargic but arousable, answers few simple questions  Skin:  No acute rashes     Basic Metabolic Panel:  Recent Labs  Lab 05/27/19 0918 05/27/19 1613  NA 136  --   K 5.0  --   CL 96*  --   CO2 28  --   GLUCOSE 117*  --   BUN 46*  --   CREATININE 11.24* 11.58*  CALCIUM 9.4  --   MG 2.0 2.1  PHOS 1.9* 2.0*     CBC: Recent Labs  Lab 05/27/19 0918 05/27/19 1613  WBC 12.0* 8.9  HGB 10.6* 9.9*  HCT 33.6* 31.8*  MCV 73.0* 74.1*  PLT 175 171      Lab Results  Component Value Date   HEPBSAG NON REACTIVE 05/01/2019   HEPBSAB Reactive (A) 05/01/2019   HEPBIGM NON REACTIVE 05/01/2019      Microbiology:  Recent Results (from the past 240 hour(s))  SARS CORONAVIRUS 2 (TAT 6-24 HRS)  Nasopharyngeal Nasopharyngeal Swab     Status: None   Collection Time: 05/27/19  9:54 AM   Specimen: Nasopharyngeal Swab  Result Value Ref Range Status   SARS Coronavirus 2 NEGATIVE NEGATIVE Final    Comment: (NOTE) SARS-CoV-2 target nucleic acids are NOT DETECTED. The SARS-CoV-2 RNA is generally detectable in upper and lower respiratory specimens during the acute phase of infection. Negative results do not preclude SARS-CoV-2 infection, do not rule out co-infections with other pathogens, and should not be used as the sole basis for treatment or other patient management decisions. Negative results must be combined with clinical observations, patient history, and epidemiological information. The expected result is Negative. Fact Sheet for Patients: SugarRoll.be Fact Sheet for Healthcare Providers: https://www.woods-mathews.com/ This test is not yet approved or cleared by the [place] FDA and  has been authorized for detection and/or diagnosis of SARS-CoV-2 by FDA under an Emergency Use Authorization (EUA). This EUA will remain  in effect (meaning this test can be used) for the duration of the COVID-19 declaration under Section 56 4(b)(1) of the Act, 21 U.S.C. section 360bbb-3(b)(1), unless the authorization is terminated or revoked sooner. Performed at [facility], [place] [address] [place], [place] [address]  Culture, blood (routine x 2)     Status: None (Preliminary result)   Collection Time: 05/27/19  4:13 PM   Specimen: BLOOD  Result Value Ref Range Status   Specimen Description BLOOD BLOOD RIGHT HAND  Final   Special Requests   Final    BOTTLES DRAWN AEROBIC AND ANAEROBIC Blood Culture adequate volume   Culture   Final    NO GROWTH < 12 HOURS Performed at [facility] [address], [place], [place] [address]    Report Status PENDING  Incomplete  Culture, blood (routine x 2)     Status: None (Preliminary  result)   Collection Time: 05/27/19  4:34 PM   Specimen: BLOOD  Result Value Ref Range Status   Specimen Description BLOOD RIGHT ANTECUBITAL  Final   Special Requests   Final    BOTTLES DRAWN AEROBIC AND ANAEROBIC Blood Culture adequate volume   Culture   Final    NO GROWTH < 12 HOURS Performed at [facility], [address] [place], [place] [address]    Report Status PENDING  Incomplete    Coagulation Studies: No results for input(s): LABPROT, INR in the last 72 hours.  Urinalysis: No results for input(s): COLORURINE, LABSPEC, PHURINE, GLUCOSEU, HGBUR, BILIRUBINUR, KETONESUR, PROTEINUR, UROBILINOGEN, NITRITE, LEUKOCYTESUR in the last 72 hours.  Invalid input(s): APPERANCEUR    Imaging: CT Head Wo Contrast  Result Date: 05/27/2019 CLINICAL DATA:  Weakness. EXAM: CT HEAD WITHOUT CONTRAST TECHNIQUE: Contiguous axial images were obtained from the base of the skull through the vertex without intravenous contrast. COMPARISON:  April 30, 2019. FINDINGS: Brain: Mild diffuse cortical atrophy is noted. Mild chronic ischemic white matter disease is noted. No mass effect or midline shift is noted. Ventricular size is within normal limits. There is no evidence of mass lesion, hemorrhage or acute infarction. Vascular: No hyperdense vessel or unexpected calcification. Skull: Normal. Negative for fracture or focal lesion. Sinuses/Orbits: No acute finding. Other: None. IMPRESSION: Mild diffuse cortical atrophy. Mild chronic ischemic white matter disease. No acute intracranial abnormality seen. Electronically Signed   By: Marijo Conception M.D.   On: 05/27/2019 08:51   DG Chest Port 1 View  Result Date: 05/27/2019 CLINICAL DATA:  Weakness. EXAM: PORTABLE CHEST 1 VIEW COMPARISON:  Chest radiograph 04/30/2019 FINDINGS: Mild cardiomegaly.  Sequela of prior median sternotomy. Opacity at the left lung base may reflect atelectasis, pneumonia and/or effusion. The right lung is clear. No evidence  of pneumothorax. No acute bony abnormality Overlying cardiac monitoring leads. Partially visualized vascular stent within the left axilla. IMPRESSION: Mild cardiomegaly. Opacity at the left lung base may reflect atelectasis, pneumonia and/or effusion. Electronically Signed   By: Kellie Simmering DO   On: 05/27/2019 07:39     Medications:   . piperacillin-tazobactam (ZOSYN)  IV Stopped (05/28/19 [id number])   . allopurinol  100 mg Oral Daily  . aspirin EC  81 mg Oral Daily  . atorvastatin  80 mg Oral Daily  . calcium acetate  1,334 mg Oral TID WC  . Chlorhexidine Gluconate Cloth  6 each Topical Q0600  . clopidogrel  75 mg Oral Q breakfast  . heparin  5,000 Units Subcutaneous Q8H  . levETIRAcetam  500 mg Oral Daily  . losartan  25 mg Oral Daily  . memantine  5 mg Oral Daily  . metoprolol succinate  12.5 mg Oral Once per day on Tue Thu Sat  . metoprolol succinate  25 mg Oral Once per day on Sun Mon  Wed Fri  . mirtazapine  15 mg Oral QHS  . sodium chloride flush  3 mL Intravenous Q12H   acetaminophen **OR** acetaminophen, ondansetron **OR** ondansetron (ZOFRAN) IV  Assessment/ Plan:  68 y.o. Asian ([place])  male with end stage renal disease on hemodialysis, history of renal transplant, hypertension, diabetes mellitus type II, peptic ulcer disease, gout, diastolic congestive heart failure.   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)   HTN (hypertension)   Anemia associated with chronic renal failure   Mixed hyperlipidemia   Seizure disorder (HCC)   Dementia (HCC)   FTT (failure to thrive) in adult   Dysphagia   History of CVA (cerebrovascular accident)  PRD N18.6 End stage renal disease Modality In-Center Hemodialysis Shift [facility] Start Date 03/15/2014 FDODE 05/22/1995 Metrics Height 149.9 cm / 59 inches Last Tx Weight 68.1 kgs / 150 lbs BMI 30.32 Access Primary  Access AV Graft Site Arm (Left lower) Placement Date 02/16/12 Treatment Details Time 195 Minutes Target Weight 68.5 Kg [facility] [facility]/   Results for JAWAN, KLINKER (MRN [id number]) as of 05/27/2019 11:19  Ref. Range 05/01/2019 14:00  Hep B S Ab Latest Ref Range: NON REACTIVE  Reactive (A)    #. ESRD, TTS Tolerated hemodialysis well yesterday. Total of 500 cc removed Next hemodialysis planned for Saturday  #. Anemia of CKD  Lab Results  Component Value Date   HGB 9.9 (L) 05/27/2019   Low dose EPO with HD  #. SHPTH  No results found for: PTH Lab Results  Component Value Date   PHOS 2.0 (L) 05/27/2019   Monitor calcium and phos level during this admission   #. Diabetes type 2 with CKD Hgb A1c MFr Bld (%)  Date Value  04/26/2019 5.8 (H)   #Weakness Chest x-ray shows cardiomegaly, opacity at left lung base the possible pneumonia. IV antibiotics as per internal medicine team    LOS: 1 Tyresha Fede 12/11/202012:59 PM  [facility] [place], [place]

## 2019-05-28 NOTE — TOC Initial Note (Signed)
Transition of Care [facility]) - Initial/Assessment Note    Patient Details  Name: [name] MRN: [id number] Date of Birth: October 07, 1950  Transition of Care [facility]) CM/SW Contact:    David Rogers, LCSW Phone Number: 05/28/2019, 3:01 PM  Clinical Narrative:                  Patient is from home. Patient lives at home with his wife and arrived at the ED due to weakness. Patient's POA is his daughter [name]. CSW talked with pt's son, [name]. Patient's family was not interested in home health but stated that since his last emergency room visit the patient has no strength. Patient's family stated that a skilled nursing facility will be best for the patient at this time. He did go to one in [place] at least twice. Patient does have dementia and is non-verbal at baseline (language interpreter is needed: [name] is the language). Patient's PCP is David Richards, MD. Patient's pharmacy is [facility] in [place] and patient has no issues obtaining his medications.   Expected Discharge Plan: Skilled Nursing Facility Barriers to Discharge: Continued Medical Work up   Patient Goals and CMS Choice Patient states their goals for this hospitalization and ongoing recovery are:: Pt's son stated, "get him back walking" CMS Medicare.gov Compare Post Acute Care list provided to:: Other (Comment Required)(Patient's POA, [name] (daughter)) Choice offered to / list presented to : Patient, [facility] POA / Guardian, Adult Children  Expected Discharge Plan and Services Expected Discharge Plan: [place] In-house Referral: Clinical Social Work   Post Acute Care Choice: [name] Living arrangements for the past 2 months: [place]                                      Prior Living Arrangements/Services Living arrangements for the past 2 months: Single Family Home Lives with:: Self, Spouse Patient language and need for interpreter reviewed:: Yes Do you feel  safe going back to the place where you live?: Yes      Need for Family Participation in Patient Care: Yes (Comment)(Pt's daughter (POA), son, and wife) Care giver support system in place?: Yes (comment)(Pt's daughter (POA), son, and wife) Current home services: DME Criminal Activity/Legal Involvement Pertinent to Current Situation/Hospitalization: No - Comment as needed  Activities of Daily Living      Permission Sought/Granted                  Emotional Assessment Appearance:: Appears stated age Attitude/Demeanor/Rapport: Unable to Assess Affect (typically observed): Unable to Assess Orientation: : Oriented to Self, Oriented to Place(Language barrier)   Psych Involvement: No (comment)  Admission diagnosis:  Pneumonia [J18.9] Generalized weakness [R53.1] Patient Active Problem List   Diagnosis Date Noted  . Generalized weakness   . FTT (failure to thrive) in adult 05/27/2019  . Dysphagia 05/27/2019  . History of CVA (cerebrovascular accident) 05/27/2019  . Dementia ([place]) 05/01/2019  . Seizure ([place]) 05/01/2019  . Acute metabolic encephalopathy [id number]  . Pneumonia 04/26/2019  . Community acquired pneumonia 11/13/2018  . CVA (cerebral vascular accident) ([place])   . Palliative care by specialist   . Seizure disorder ([place]) 02/19/2018  . Calculus of common duct   . Fitting and adjustment of gastrointestinal appliance and device   . Elevated LFTs 09/16/2016  . Calculus of bile duct without cholecystitis with obstruction   . Choledocholithiasis   .  Diabetes ([place]) 09/04/2016  . HTN (hypertension) 09/04/2016  . GERD (gastroesophageal reflux disease) 09/04/2016  . Chronic diastolic CHF (congestive heart failure) ([place]) 09/04/2016  . Transaminitis 09/04/2016  . Sepsis ([place]) 09/04/2016  . CAP (community acquired pneumonia) 09/04/2016  . Encephalopathy acute   . HCAP (healthcare-associated pneumonia) 07/24/2016  . Bradycardia 06/25/2016  . Alteration in self-care ability  03/05/2016  . Hx of subdural hematoma 03/05/2016  . 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 NSTEMI (non-ST elevated myocardial infarction) ([place]) 02/26/2016  . H/O non-ST elevation myocardial infarction (NSTEMI) 01/10/2016  . S/P coronary artery stent placement 01/10/2016  . Depression, major, single episode, complete remission ([place]) 07/22/2015  . Acute respiratory failure with hypoxia ([place]) 02/14/2015  . Syncope 11/05/2014  . Acute cystitis with hematuria 10/11/2014  . Acute subdural hematoma ([place]) 10/10/2014  . Anemia in chronic kidney disease (CKD) 10/07/2014  . ESRD on hemodialysis ([place]) 10/07/2014  . Mixed hyperlipidemia 10/07/2014  . 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 Contaminant given to patient   . Shortness of breath   . Viridans streptococci infection   . Polymicrobial bacterial infection   . Bacteremia   . 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 ESRD on dialysis ([place])   . Type 1 diabetes mellitus with other diabetic kidney complication ([place])   . Acute coronary syndrome ([place]) 09/29/2014  . Anemia associated with chronic renal failure 03/25/2014  . Nausea & vomiting 03/25/2014  . Gout 03/17/2012  . H/O: upper GI bleed 03/17/2012  . Hyperlipidemia, unspecified 03/17/2012  . Kidney transplant status, cadaveric 07/16/2002   PCP:  David Ruths, MD Pharmacy:   [facility] [place], [place] - [address] [place] [address] Phone: [phone] Fax: [phone]  [facility] (ESRD Bundle Only) - [place], [place] [address] [place] [address] Phone: [phone] Fax: [place], [place] [facility] [address] [place] [address] [place] [address] Phone: [phone] Fax: [place], [place]  [address] Phone: [phone] Fax: [phone]     Social Determinants of Health ([place]) Interventions    Readmission Risk Interventions Readmission Risk Prevention Plan 05/28/2019 05/28/2019 05/02/2019  Transportation Screening Complete Complete Complete  PCP or Specialist Appt within 3-5 Days - - Complete  [place] or Home Care Consult Complete - Complete  Social Work Consult for [place] Planning/Counseling Complete - Patient refused  Palliative Care Screening Not Applicable - Patient Refused  Medication Review Press photographer) Complete - Complete  Some recent data might be hidden

## 2019-05-28 NOTE — Evaluation (Signed)
Physical Therapy Evaluation Patient Details Name: [name] MRN: [id number] DOB: 04-10-1951 Today's Date: 05/28/2019   History of Present Illness  68 y.o. 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  Patient was recently discharged on 11/15 after an admission for right lower lobe pneumonia with sepsis physiology.  Due to patient's significant physical debility and recurrent encephalopathy extensive discussions were held with the family guarding placement in skilled nursing facility but they declined.  It was documented that patient will be high risk for admission with significant needs at home if patient's family continue to decline SNF. Patient presented to ED from home with generalized weakness.    Clinical Impression  Interpretor utilized throughout session. Patient in bed, alert, oriented to self only. The patient was able to follow simple commands. PLOF gathered from chart review and CSW. Per son the patient was able to ambulate intermittently without assistance prior to most recent hospitalization (November).   The patient was able to lift extremities against gravity with tactile cues. Supine <> sit with maxA, pt unable to fully come to EOB, one moment of being able to initiate anterior weight shift. Returned to supine and repositioned.  Overall the patient demonstrated deficits (see "PT Problem List") that impede the patient's functional abilities, safety, and mobility and would benefit from skilled PT intervention. Recommendation is SNF due to acute decline in functional mobility/status and current level of assistance needed.     Follow Up Recommendations SNF    Equipment Recommendations  Other (comment)(TBD)    Recommendations for Other Services       Precautions /  Restrictions Precautions Precautions: Fall Restrictions Weight Bearing Restrictions: No      Mobility  Bed Mobility Overal bed mobility: Needs Assistance Bed Mobility: Supine to Sit;Sit to Supine     Supine to sit: Max assist;HOB elevated Sit to supine: Max assist;HOB elevated   General bed mobility comments: unable to fully to come to EOB  Transfers                 General transfer comment: deferred  Ambulation/Gait                Stairs            Wheelchair Mobility    Modified Rankin (Stroke Patients Only)       Balance                                             Pertinent Vitals/Pain Pain Assessment: Faces Faces Pain Scale: Hurts a little bit Pain Location: generalized pain Pain Descriptors / Indicators: Grimacing Pain Intervention(s): Limited activity within patient's tolerance;Monitored during session    Home Living Family/patient expects to be discharged to:: Private residence Living Arrangements: Spouse/significant other;Children Available Help at Discharge: Family;Available 24 hours/day Type of Home: House Home Access: Stairs to enter Entrance Stairs-Rails: Chemical engineer of Steps: 4 (2 in front without rail)  Home Layout: One level Home Equipment: Walker - 2 wheels;Shower seat;Wheelchair - manual;Cane - single point Additional Comments: Per CSW with son over the phone and chart review.    Prior Function           Comments: Family reported that prior to  most recent hospitalization pt was ambulating without physical assist     Hand Dominance        Extremity/Trunk Assessment   Upper Extremity Assessment Upper Extremity Assessment: Generalized weakness;RUE deficits/detail;LUE deficits/detail RUE Deficits / Details: able to lift extremity against gravity LUE Deficits / Details: able to lift extremity against gravity    Lower Extremity Assessment Lower Extremity Assessment:  Generalized weakness;RLE deficits/detail;LLE deficits/detail RLE Deficits / Details: able to lift extremity against gravity LLE Deficits / Details: able to lift extremity against gravity    Cervical / Trunk Assessment Cervical / Trunk Assessment: Kyphotic  Communication   Communication: Interpreter utilized  Cognition Arousal/Alertness: Awake/alert Behavior During Therapy: WFL for tasks assessed/performed Overall Cognitive Status: Within Functional Limits for tasks assessed                                        General Comments      Exercises General Exercises - Lower Extremity Ankle Circles/Pumps: AROM;Both;10 reps Heel Slides: AAROM;Both;10 reps;AROM   Assessment/Plan    PT Assessment Patient needs continued PT services  PT Problem List Decreased strength;Decreased mobility;Decreased range of motion;Decreased activity tolerance;Decreased balance;Decreased knowledge of use of DME;Pain       PT Treatment Interventions DME instruction;Therapeutic exercise;Gait training;Stair training;Neuromuscular re-education;Functional mobility training;Therapeutic activities;Patient/family education    PT Goals (Current goals can be found in the Care Plan section)  Acute Rehab PT Goals PT Goal Formulation: Patient unable to participate in goal setting Time For Goal Achievement: 06/11/19 Potential to Achieve Goals: Fair    Frequency Min 2X/week   Barriers to discharge        Co-evaluation               AM-PAC PT "6 Clicks" Mobility  Outcome Measure Help needed turning from your back to your side while in a flat bed without using bedrails?: Total Help needed moving from lying on your back to sitting on the side of a flat bed without using bedrails?: Total Help needed moving to and from a bed to a chair (including a wheelchair)?: Total Help needed standing up from a chair using your arms (e.g., wheelchair or bedside chair)?: Total Help needed to walk in  hospital room?: Total Help needed climbing 3-5 steps with a railing? : Total 6 Click Score: 6    End of Session   Activity Tolerance: Patient limited by fatigue Patient left: in bed Nurse Communication: Mobility status PT Visit Diagnosis: Other abnormalities of gait and mobility (R26.89);Difficulty in walking, not elsewhere classified (R26.2);Muscle weakness (generalized) (M62.81)    Time: [id number] PT Time Calculation (min) (ACUTE ONLY): 21 min   Charges:   PT Evaluation $PT Eval Moderate Complexity: 1 Mod PT Treatments $Therapeutic Exercise: 8-22 mins        Lieutenant Diego PT, DPT 3:15 PM,05/28/19 [phone]

## 2019-05-28 NOTE — ED Notes (Signed)
Pt given ice water.

## 2019-05-28 NOTE — ED Notes (Signed)
Pt given meal tray. Daughter at bedside

## 2019-05-28 NOTE — Consult Note (Signed)
Pharmacy Antibiotic Note  [name] is a 68 y.o. male admitted on 05/27/2019 with aspiration pneumonia.  Pharmacy has been consulted for Unasyn dosing.  Plan: Unasyn 3g q24h (give after HD on HD days)  Height: 5\' 4"  (162.6 cm) Weight: 162 lb 0.6 oz (73.5 kg) IBW/kg (Calculated) : 59.2  Temp (24hrs), Avg:98.3 F (36.8 C), Min:97.7 F (36.5 C), Max:98.8 F (37.1 C)  Recent Labs  Lab 05/27/19 0918 05/27/19 1613  WBC 12.0* 8.9  CREATININE 11.24* 11.58*    Estimated Creatinine Clearance: 5.7 mL/min (A) (by C-G formula based on SCr of 11.58 mg/dL (H)).    Allergies  Allergen Reactions  . Ivp Dye [Iodinated Diagnostic Agents] Other (See Comments)    Pt denied  . Metrizamide Other (See Comments)    Contains iodine    Antimicrobials this admission: CTX/Azithromycin 12/10 x 1 Zosyn 12/10 >> 12/11  Dose adjustments this admission:   Microbiology results: 12/10 BCx: pending   12/10 COVID NEG  Thank you for allowing pharmacy to be a part of this patient's care.  Lu Duffel, PharmD, BCPS Clinical Pharmacist 05/28/2019 1:58 PM

## 2019-05-28 NOTE — Progress Notes (Signed)
Unable to obtain morning labs. Phlebotomy tried to perform venipuncture. Nursing tried as well with no success. Attending Md Dr. Reesa Chew notified via secure chat.

## 2019-05-28 NOTE — TOC Progression Note (Signed)
Transition of Care [facility]) - Progression Note    Patient Details  Name: Jiancarlo Laughman MRN: [id number] Date of Birth: [birth year]  Transition of Care [facility]) CM/SW [place], [place] Phone Number: 05/28/2019, 3:21 PM  Clinical Narrative:     Phoebe Perch was completed and sent out to different SNF facilities.  Expected Discharge Plan: [place] Barriers to Discharge: Continued Medical Work up  Expected Discharge Plan and Services Expected Discharge Plan: [place] In-house Referral: Clinical Social Work   Post Acute Care Choice: [place] Living arrangements for the past 2 months: [place] Determinants of Health (SDOH) Interventions    Readmission Risk Interventions Readmission Risk Prevention Plan 05/28/2019 05/28/2019 05/02/2019  Transportation Screening Complete Complete Complete  PCP or Specialist Appt within 3-5 Days - - Complete  [place] or Home Care Consult Complete - Complete  Social Work Consult for [place] Planning/Counseling Complete - Patient refused  Palliative Care Screening Not Applicable - Patient Refused  Medication Review Press photographer) Complete - Complete  Some recent data might be hidden

## 2019-05-29 ENCOUNTER — Other Ambulatory Visit: Payer: Self-pay

## 2019-05-29 LAB — COMPREHENSIVE METABOLIC PANEL
ALT: 10 U/L (ref 0–44)
AST: 15 U/L (ref 15–41)
Albumin: 2.9 g/dL — ABNORMAL LOW (ref 3.5–5.0)
Alkaline Phosphatase: 63 U/L (ref 38–126)
Anion gap: 12 (ref 5–15)
BUN: 38 mg/dL — ABNORMAL HIGH (ref 8–23)
CO2: 27 mmol/L (ref 22–32)
Calcium: 8.8 mg/dL — ABNORMAL LOW (ref 8.9–10.3)
Chloride: 98 mmol/L (ref 98–111)
Creatinine, Ser: 9.29 mg/dL — ABNORMAL HIGH (ref 0.61–1.24)
GFR calc Af Amer: 6 mL/min — ABNORMAL LOW (ref >60)
GFR calc non Af Amer: 5 mL/min — ABNORMAL LOW (ref >60)
Glucose, Bld: 88 mg/dL (ref 70–99)
Potassium: 4.2 mmol/L (ref 3.5–5.1)
Sodium: 137 mmol/L (ref 135–145)
Total Bilirubin: 0.7 mg/dL (ref 0.3–1.2)
Total Protein: 6.9 g/dL (ref 6.5–8.1)

## 2019-05-29 LAB — CBC
HCT: 29.8 % — ABNORMAL LOW (ref 39.0–52.0)
Hemoglobin: 9.2 g/dL — ABNORMAL LOW (ref 13.0–17.0)
MCH: 23.1 pg — ABNORMAL LOW (ref 26.0–34.0)
MCHC: 30.9 g/dL (ref 30.0–36.0)
MCV: 74.9 fL — ABNORMAL LOW (ref 80.0–100.0)
Platelets: 156 10*3/uL (ref 150–400)
RBC: 3.98 MIL/uL — ABNORMAL LOW (ref 4.22–5.81)
RDW: 16.1 % — ABNORMAL HIGH (ref 11.5–15.5)
WBC: 6.4 10*3/uL (ref 4.0–10.5)
nRBC: 0 % (ref 0.0–0.2)

## 2019-05-29 LAB — PHOSPHORUS: Phosphorus: 2.8 mg/dL (ref 2.5–4.6)

## 2019-05-29 LAB — LEVETIRACETAM LEVEL: Levetiracetam Lvl: 31.5 ug/mL (ref 10.0–40.0)

## 2019-05-29 MED ORDER — LOSARTAN POTASSIUM 50 MG PO TABS
50.0000 mg | ORAL_TABLET | Freq: Every day | ORAL | Status: DC
  Administered 2019-05-29 – 2019-05-31 (×3): 50 mg via ORAL
  Filled 2019-05-29 (×3): qty 1

## 2019-05-29 NOTE — Progress Notes (Addendum)
PROGRESS NOTE    [name]  [id number] DOB: Mar 15, 1951 DOA: 05/27/2019 PCP: Kirk Ruths, MD   Brief Narrative:  [name] is a 68 y.o. 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  Patient was recently discharged on 11/15 after an admission for right lower lobe pneumonia with sepsis physiology.  Due to patient's significant physical debility and recurrent encephalopathy extensive discussions were held with the family guarding placement in skilled nursing facility but they declined.  It was documented that patient will be high risk for admission with significant needs at home if patient's family continue to decline SNF.  Patient presented to ED from home with generalized weakness. Having mild leukocytosis, procalcitonin of 0.42, COVID-19 negative and chest x-ray with persistent left lower lobe infiltrate.  Subjective: Patient was seen in dialysis today.  He just open eyes when calling his name and does not follow any other commands.  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  Patient is primarily bedbound and require a lot of help for mobilization even transferring from bed to chair. Chest x-ray with persistent left lower lobe pneumonia.  There was some concern for aspiration. Concern for some obstruction.  And has a CT chest done in May 2020 which also shows left lower lobe  infiltrate/scarring. Patient to have mild leukocytosis and procalcitonin is mildly elevated although much improved from his prior numbers. Patient has a very poor baseline functional status.  Do not know how beneficial it is to involve pulmonary for a possible bronchoscopy. Per chart review patient has similar appearances on multiple occasions.  Talked with his nephrologist Dr. Candiss Norse who also told me that patient appears like this on multiple occasions for dialysis.  There are days when he is little more alert, otherwise mostly very sleepy and tired and does not communicate well. He was started on Zosyn which was discontinued next day and patient was started on Unasyn. -Continue Unasyn. -Palliative care consult to discuss further goals of care as of his poor functional status. -PT/OT evaluation. -Apparently family now wants him to go to SNF.  Patient is unable to feed himself.  Not taking much from nursing staff.  His prognosis seems poor.  ESRD (TTS).  Patient getting his routine dialysis today.  No morning labs as patient is hard stick and unable to obtain a sample after multiple attempts. -Continue with his scheduled dialysis.  Dementia/failure to thrive in adult/mild dysphagia. 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 Talked with his son Nicole Kindred on phone.  Patient has a a lot of decline in his functional status over the past few months. Complains of coughing with eating.  He requires multiple family  members to mobilize or help with feeding.  He does not communicate most of the time.  -Now family is open to look for a skilled nursing facility with hospice  care. -Palliative care consult was placed.  Diabetes mellitus 2 without current hyperglycemia -Patient has poor oral intake chronically therefore no longer requiring diabetes medications -Hemoglobin A1c November 9 was 5.8 which is more consistent with prediabetes. CBG remained between 99-150. -Continue to monitor.  Hypertension.  Blood pressure remained elevated. -Increase home dose of losartan to 50 mg daily.  Seizure disorder.  No witnessed seizure-like activity. Prior EEGs during previous admission shows mild to moderate encephalopathy without any seizure-like activity. -Keppra level still pending. -Continue Keppra.  History of prior CVA with left hemiplegia.  No new focal deficit.  Repeat CT head yesterday was without any acute intracranial abnormality.  Objective: Vitals:   05/29/19 1200 05/29/19 1215 05/29/19 1230 05/29/19 1245  BP: 119/74 118/67 124/67 128/70  Pulse: 71 72 73 75  Resp: 20 15 (!) 21 20  Temp:      TempSrc:      SpO2: 97% 99%    Weight:      Height:        Intake/Output Summary (Last 24 hours) at 05/29/2019 1300 Last data filed at 05/29/2019 1011 Gross per 24 hour  Intake 100 ml  Output --  Net 100 ml   Filed Weights   05/27/19 0707 05/27/19 1750  Weight: 73.5 kg 73.5 kg    Examination:  General exam: Chronically ill-appearing gentleman, open eyes on command, no other response, in no acute distress. Respiratory system: Clear to auscultation. Respiratory effort normal. Cardiovascular system: S1 & S2 heard, RRR. No JVD, murmurs, rubs, gallops or clicks.  Gastrointestinal system: Abdomen is nondistended, soft and nontender. No organomegaly or masses felt. Normal bowel sounds heard. Central nervous system: Unable to assess as patient was not following any command. Extremities: Trace lower extremity edema, pulses intact and symmetrical. Psychiatry: Patient appears tired and lethargic.  DVT prophylaxis: Heparin Code Status: DNR Family  Communication: No family at bedside. Disposition Plan: Pending improvement/family decision.  Consultants:   Nephrology  Palliative  Procedures:  Antimicrobials:  Unasyn.  Data Reviewed: I have personally reviewed following labs and imaging studies  CBC: Recent Labs  Lab 05/27/19 0918 05/27/19 1613 05/28/19 1000  WBC 12.0* 8.9 6.4  HGB 10.6* 9.9* 9.2*  HCT 33.6* 31.8* 29.8*  MCV 73.0* 74.1* 74.9*  PLT 175 171 [id number]   Basic Metabolic Panel: Recent Labs  Lab 05/27/19 0918 05/27/19 1613 05/28/19 1000  NA 136  --  137  K 5.0  --  4.2  CL 96*  --  98  CO2 28  --  27  GLUCOSE 117*  --  88  BUN 46*  --  38*  CREATININE 11.24* 11.58* 9.29*  CALCIUM 9.4  --  8.8*  MG 2.0 2.1  --   PHOS 1.9* 2.0*  --    GFR: Estimated Creatinine Clearance: 7.1 mL/min (A) (by C-G formula based on SCr of 9.29 mg/dL (H)). Liver Function Tests: Recent Labs  Lab 05/28/19 1000  AST 15  ALT 10  ALKPHOS 63  BILITOT 0.7  PROT 6.9  ALBUMIN 2.9*   No results for input(s): LIPASE, AMYLASE in the last 168 hours. No results for input(s): AMMONIA in the last 168 hours. Coagulation Profile: No results for input(s): INR, PROTIME in the last 168 hours. Cardiac Enzymes: No results for input(s): CKTOTAL, CKMB, CKMBINDEX, TROPONINI in the  last 168 hours. BNP (last 3 results) No results for input(s): PROBNP in the last 8760 hours. HbA1C: No results for input(s): HGBA1C in the last 72 hours. CBG: No results for input(s): GLUCAP in the last 168 hours. Lipid Profile: No results for input(s): CHOL, HDL, LDLCALC, TRIG, CHOLHDL, LDLDIRECT in the last 72 hours. Thyroid Function Tests: No results for input(s): TSH, T4TOTAL, FREET4, T3FREE, THYROIDAB in the last 72 hours. Anemia Panel: No results for input(s): VITAMINB12, FOLATE, FERRITIN, TIBC, IRON, RETICCTPCT in the last 72 hours. Sepsis Labs: Recent Labs  Lab 05/27/19 0918  PROCALCITON 0.42    Recent Results (from the past 240 hour(s))   SARS CORONAVIRUS 2 (TAT 6-24 HRS) Nasopharyngeal Nasopharyngeal Swab     Status: None   Collection Time: 05/27/19  9:54 AM   Specimen: Nasopharyngeal Swab  Result Value Ref Range Status   SARS Coronavirus 2 NEGATIVE NEGATIVE Final    Comment: (NOTE) SARS-CoV-2 target nucleic acids are NOT DETECTED. The SARS-CoV-2 RNA is generally detectable in upper and lower respiratory specimens during the acute phase of infection. Negative results do not preclude SARS-CoV-2 infection, do not rule out co-infections with other pathogens, and should not be used as the sole basis for treatment or other patient management decisions. Negative results must be combined with clinical observations, patient history, and epidemiological information. The expected result is Negative. Fact Sheet for Patients: SugarRoll.be Fact Sheet for Healthcare Providers: https://www.woods-mathews.com/ This test is not yet approved or cleared by the [place] FDA and  has been authorized for detection and/or diagnosis of SARS-CoV-2 by FDA under an Emergency Use Authorization (EUA). This EUA will remain  in effect (meaning this test can be used) for the duration of the COVID-19 declaration under Section 56 4(b)(1) of the Act, 21 U.S.C. section 360bbb-3(b)(1), unless the authorization is terminated or revoked sooner. Performed at [facility], [place] [address], [place], [place] [address]   Culture, blood (routine x 2)     Status: None (Preliminary result)   Collection Time: 05/27/19  4:13 PM   Specimen: BLOOD  Result Value Ref Range Status   Specimen Description BLOOD BLOOD RIGHT HAND  Final   Special Requests   Final    BOTTLES DRAWN AEROBIC AND ANAEROBIC Blood Culture adequate volume   Culture   Final    NO GROWTH 2 DAYS Performed at [facility], [address], [place], [place] [address]    Report Status PENDING  Incomplete  Culture, blood (routine x 2)      Status: None (Preliminary result)   Collection Time: 05/27/19  4:34 PM   Specimen: BLOOD  Result Value Ref Range Status   Specimen Description BLOOD RIGHT ANTECUBITAL  Final   Special Requests   Final    BOTTLES DRAWN AEROBIC AND ANAEROBIC Blood Culture adequate volume   Culture   Final    NO GROWTH 2 DAYS Performed at [facility], [address] [place], [place] [address]    Report Status PENDING  Incomplete     Radiology Studies: No results found.  Scheduled Meds: . allopurinol  100 mg Oral Daily  . aspirin EC  81 mg Oral Daily  . atorvastatin  80 mg Oral Daily  . calcium acetate  1,334 mg Oral TID WC  . Chlorhexidine Gluconate Cloth  6 each Topical Q0600  . clopidogrel  75 mg Oral Q breakfast  . epoetin (EPOGEN/PROCRIT) injection  4,000 Units Intravenous Q T,Th,Sa-HD  . heparin  5,000 Units  Subcutaneous Q8H  . levETIRAcetam  500 mg Oral Daily  . losartan  50 mg Oral Daily  . memantine  5 mg Oral Daily  . metoprolol succinate  12.5 mg Oral Once per day on Tue Thu Sat  . metoprolol succinate  25 mg Oral Once per day on Sun Mon Wed Fri  . mirtazapine  15 mg Oral QHS  . sodium chloride flush  3 mL Intravenous Q12H   Continuous Infusions: . ampicillin-sulbactam (UNASYN) IV Stopped (05/28/19 1702)     LOS: 2 days   Time spent: 35 minutes.  I personally reviewed his chart.  Lorella Nimrod, MD [facility] Pager [phone]  If 7PM-7AM, please contact night-coverage [email or web address] Password [facility] 05/29/2019, 1:00 PM   This record has been created using Systems analyst. Errors have been sought and corrected,but may not always be located. Such creation errors do not reflect on the standard of care.

## 2019-05-29 NOTE — Progress Notes (Signed)
Hd started  

## 2019-05-29 NOTE — Progress Notes (Signed)
[facility] [place], [place] 05/29/19  Subjective:   LOS: 2 12/11 0701 - 12/12 0700 In: 100 [IV Piggyback:100] Out: -  Patient presented to the emergency room for weakness.  Recent hospitalization in November for right lower lobe pneumonia.  Admitted for possible aspiration component.  . Minimally interactive.  Denies any acute complaints Seen during dialysis.  Tolerating well   HEMODIALYSIS FLOWSHEET:  Blood Flow Rate (mL/min): 350 mL/min Arterial Pressure (mmHg): -100 mmHg Venous Pressure (mmHg): 210 mmHg Transmembrane Pressure (mmHg): 60 mmHg Ultrafiltration Rate (mL/min): 340 mL/min Dialysate Flow Rate (mL/min): 600 ml/min Conductivity: Machine : 13.8 Conductivity: Machine : 13.8 Dialysis Fluid Bolus: Normal Saline Bolus Amount (mL): 250 mL    Objective:  Vital signs in last 24 hours:  Temp:  [98.1 F (36.7 C)-98.8 F (37.1 C)] 98.8 F (37.1 C) (12/12 1250) Pulse Rate:  [65-91] 73 (12/12 1343) Resp:  [14-23] 19 (12/12 1343) BP: (107-169)/(63-86) 128/70 (12/12 1245) SpO2:  [94 %-100 %] 99 % (12/12 1215)  Weight change:  Filed Weights   05/27/19 0707 05/27/19 1750  Weight: 73.5 kg 73.5 kg    Intake/Output:    Intake/Output Summary (Last 24 hours) at 05/29/2019 1420 Last data filed at 05/29/2019 1250 Gross per 24 hour  Intake 100 ml  Output 500 ml  Net -400 ml     Physical Exam: General: NAD  HEENT Anicteric, moist oral mucous membranes  Pulm/lungs  normal breathing effort on room air, clear to auscultation  CVS/Heart  no rub or gallop  Abdomen:   Soft, nontender  Extremities:  No peripheral edema  Neurologic:  Lethargic   Skin:  No acute rashes     Basic Metabolic Panel:  Recent Labs  Lab 05/27/19 0918 05/27/19 1613 05/28/19 1000  NA 136  --  137  K 5.0  --  4.2  CL 96*  --  98  CO2 28  --  27  GLUCOSE 117*  --  88  BUN 46*  --  38*  CREATININE 11.24* 11.58* 9.29*  CALCIUM 9.4  --  8.8*  MG 2.0 2.1  --   PHOS  1.9* 2.0*  --      CBC: Recent Labs  Lab 05/27/19 0918 05/27/19 1613 05/28/19 1000  WBC 12.0* 8.9 6.4  HGB 10.6* 9.9* 9.2*  HCT 33.6* 31.8* 29.8*  MCV 73.0* 74.1* 74.9*  PLT 175 171 156      Lab Results  Component Value Date   HEPBSAG NON REACTIVE 05/01/2019   HEPBSAB Reactive (A) 05/01/2019   HEPBIGM NON REACTIVE 05/01/2019      Microbiology:  Recent Results (from the past 240 hour(s))  SARS CORONAVIRUS 2 (TAT 6-24 HRS) Nasopharyngeal Nasopharyngeal Swab     Status: None   Collection Time: 05/27/19  9:54 AM   Specimen: Nasopharyngeal Swab  Result Value Ref Range Status   SARS Coronavirus 2 NEGATIVE NEGATIVE Final    Comment: (NOTE) SARS-CoV-2 target nucleic acids are NOT DETECTED. The SARS-CoV-2 RNA is generally detectable in upper and lower respiratory specimens during the acute phase of infection. Negative results do not preclude SARS-CoV-2 infection, do not rule out co-infections with other pathogens, and should not be used as the sole basis for treatment or other patient management decisions. Negative results must be combined with clinical observations, patient history, and epidemiological information. The expected result is Negative. Fact Sheet for Patients: SugarRoll.be Fact Sheet for Healthcare Providers: https://www.woods-mathews.com/ This test is not yet approved or cleared by the [place]  FDA and  has been authorized for detection and/or diagnosis of SARS-CoV-2 by FDA under an Emergency Use Authorization (EUA). This EUA will remain  in effect (meaning this test can be used) for the duration of the COVID-19 declaration under Section 56 4(b)(1) of the Act, 21 U.S.C. section 360bbb-3(b)(1), unless the authorization is terminated or revoked sooner. Performed at [facility], [place] [address] [place], [place] [address]   Culture, blood (routine x 2)     Status: None (Preliminary result)   Collection  Time: 05/27/19  4:13 PM   Specimen: BLOOD  Result Value Ref Range Status   Specimen Description BLOOD BLOOD RIGHT HAND  Final   Special Requests   Final    BOTTLES DRAWN AEROBIC AND ANAEROBIC Blood Culture adequate volume   Culture   Final    NO GROWTH 2 DAYS Performed at [facility], [address], [place], [place] [address]    Report Status PENDING  Incomplete  Culture, blood (routine x 2)     Status: None (Preliminary result)   Collection Time: 05/27/19  4:34 PM   Specimen: BLOOD  Result Value Ref Range Status   Specimen Description BLOOD RIGHT ANTECUBITAL  Final   Special Requests   Final    BOTTLES DRAWN AEROBIC AND ANAEROBIC Blood Culture adequate volume   Culture   Final    NO GROWTH 2 DAYS Performed at [facility], [address], [place], [place] [address]    Report Status PENDING  Incomplete    Coagulation Studies: No results for input(s): LABPROT, INR in the last 72 hours.  Urinalysis: No results for input(s): COLORURINE, LABSPEC, PHURINE, GLUCOSEU, HGBUR, BILIRUBINUR, KETONESUR, PROTEINUR, UROBILINOGEN, NITRITE, LEUKOCYTESUR in the last 72 hours.  Invalid input(s): APPERANCEUR    Imaging: No results found.   Medications:   . ampicillin-sulbactam (UNASYN) IV Stopped (05/28/19 1702)   . allopurinol  100 mg Oral Daily  . aspirin EC  81 mg Oral Daily  . atorvastatin  80 mg Oral Daily  . calcium acetate  1,334 mg Oral TID WC  . Chlorhexidine Gluconate Cloth  6 each Topical Q0600  . clopidogrel  75 mg Oral Q breakfast  . epoetin (EPOGEN/PROCRIT) injection  4,000 Units Intravenous Q T,Th,Sa-HD  . heparin  5,000 Units Subcutaneous Q8H  . levETIRAcetam  500 mg Oral Daily  . losartan  50 mg Oral Daily  . memantine  5 mg Oral Daily  . metoprolol succinate  12.5 mg Oral Once per day on Tue Thu Sat  . metoprolol succinate  25 mg Oral Once per day on Sun Mon Wed Fri  . mirtazapine  15 mg Oral QHS  . sodium chloride flush  3 mL Intravenous  Q12H   acetaminophen **OR** acetaminophen, ondansetron **OR** ondansetron (ZOFRAN) IV  Assessment/ Plan:  68 y.o. Asian ([place])  male with end stage renal disease on hemodialysis, history of renal transplant, hypertension, diabetes mellitus type II, peptic ulcer disease, gout, diastolic congestive heart failure.   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)   HTN (hypertension)   Anemia associated with chronic renal failure   Mixed hyperlipidemia   Seizure disorder (HCC)   Dementia (HCC)   FTT (failure to thrive) in adult   Dysphagia   History of CVA (cerebrovascular accident)  PRD N18.6 End stage renal disease Modality In-Center Hemodialysis Shift [facility] Start Date 03/15/2014  FDODE 05/22/1995 Metrics Height 149.9 cm / 59 inches Last Tx Weight 68.1 kgs / 150 lbs BMI 30.32 Access Primary Access AV Graft Site Arm (Left lower) Placement Date 02/16/12 Treatment Details Time 195 Minutes Target Weight 68.5 Kg [facility]/   Results for David, Hobbs (MRN [id number]) as of 05/27/2019 11:19  Ref. Range 05/01/2019 14:00  Hep B S Ab Latest Ref Range: NON REACTIVE  Reactive (A)    #. ESRD, TTS Patient seen during dialysis Tolerating well   #. Anemia of CKD  Lab Results  Component Value Date   HGB 9.2 (L) 05/28/2019   Low dose EPO with HD  #. SHPTH  No results found for: PTH Lab Results  Component Value Date   PHOS 2.0 (L) 05/27/2019   Monitor calcium and phos level during this admission   #. Diabetes type 2 with CKD Hgb A1c MFr Bld (%)  Date Value  04/26/2019 5.8 (H)   #Weakness Chest x-ray shows cardiomegaly, opacity at left lung base - possible pneumonia. IV antibiotics as per internal medicine team    LOS: [place] 12/12/20202:20 PM  [place], [place]

## 2019-05-29 NOTE — Progress Notes (Signed)
This note also relates to the following rows which could not be included: Pulse Rate - Cannot attach notes to unvalidated device data Resp - Cannot attach notes to unvalidated device data  Hd completed  

## 2019-05-30 MED ORDER — ORAL CARE MOUTH RINSE
15.0000 mL | Freq: Two times a day (BID) | OROMUCOSAL | Status: DC
  Administered 2019-05-30 – 2019-06-01 (×3): 15 mL via OROMUCOSAL

## 2019-05-30 NOTE — Progress Notes (Signed)
PROGRESS NOTE    [name]  [id number] DOB: 1951/05/16 DOA: 05/27/2019 PCP: Kirk Ruths, MD   Brief Narrative:  [name] is a 68 y.o. 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  Patient was recently discharged on 11/15 after an admission for right lower lobe pneumonia with sepsis physiology.  Due to patient's significant physical debility and recurrent encephalopathy extensive discussions were held with the family guarding placement in skilled nursing facility but they declined.  It was documented that patient will be high risk for admission with significant needs at home if patient's family continue to decline SNF.  Patient presented to ED from home with generalized weakness. Having mild leukocytosis, procalcitonin of 0.42, COVID-19 negative and chest x-ray with persistent left lower lobe infiltrate.  Subjective: She was little more interactive today answering questions appropriately he was able to tell me his name, date of birth and that he is in hospital.  He appears lethargic.  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  Patient is primarily bedbound and require a lot of help for mobilization even transferring from bed to chair. Chest x-ray with persistent left lower lobe pneumonia.  There was some concern for aspiration. Concern for some obstruction.  And has a CT chest  done in May 2020 which also shows left lower lobe infiltrate/scarring. Patient to have mild leukocytosis and procalcitonin is mildly elevated although much improved from his prior numbers. Patient has a very poor baseline functional status.  Do not know how beneficial it is to involve pulmonary for a possible bronchoscopy. Per chart review patient has similar appearances on multiple occasions.  Talked with his nephrologist Dr. Candiss Norse who also told me that patient appears like this on multiple occasions for dialysis.  There are days when he is little more alert, otherwise mostly very sleepy and tired and does not communicate well. He was started on Zosyn which was discontinued next day and patient was started on Unasyn. -Continue Unasyn. -Palliative care consult to discuss further goals of care as of his poor functional status. -PT/OT evaluation. -Apparently family now wants him to go to SNF - His prognosis seems poor.  ESRD (TTS).   No morning labs as patient is hard stick and unable to obtain a sample after multiple attempts. -Continue with his scheduled dialysis.  Dementia/failure to thrive in adult/mild dysphagia. 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 Talked with his son Nicole Kindred on phone.  Patient has a a lot of decline in his functional status over the past few months. Complains of coughing with eating.  He requires multiple family members to mobilize or help with feeding.  He does not communicate  most of the time.  -Now family is open to look for a skilled nursing facility with hospice care. -Palliative care consult was placed.  Diabetes mellitus 2 without current  hyperglycemia -Patient has poor oral intake chronically therefore no longer requiring diabetes medications -Hemoglobin A1c November 9 was 5.8 which is more consistent with prediabetes. CBG remained between 99-150. -Continue to monitor.  Hypertension.  Blood pressure remained elevated. -Increase home dose of losartan to 50 mg daily.  Seizure disorder.  No witnessed seizure-like activity. Prior EEGs during previous admission shows mild to moderate encephalopathy without any seizure-like activity. -Keppra level still pending. -Continue Keppra.  History of prior CVA with left hemiplegia.  No new focal deficit.  Repeat CT head was without any acute intracranial abnormality.  Objective: Vitals:   05/29/19 2354 05/30/19 0500 05/30/19 0759 05/30/19 1115  BP: (!) 175/75  (!) 183/83 (!) 182/67  Pulse: 71  76 71  Resp: 15  20 18   Temp: (!) 97.4 F (36.3 C)  97.7 F (36.5 C) 98 F (36.7 C)  TempSrc: Oral  Oral Oral  SpO2: 99%  100% 100%  Weight:  68.9 kg    Height:        Intake/Output Summary (Last 24 hours) at 05/30/2019 1353 Last data filed at 05/30/2019 1041 Gross per 24 hour  Intake 330.44 ml  Output 0 ml  Net 330.44 ml   Filed Weights   05/27/19 1750 05/29/19 1438 05/30/19 0500  Weight: 73.5 kg 68.6 kg 68.9 kg    Examination:  General exam: Chronically ill-appearing gentleman, following simple commands today, in no acute distress. Respiratory system: Clear to auscultation. Respiratory effort normal. Cardiovascular system: S1 & S2 heard, RRR. No JVD, murmurs, rubs, gallops or clicks.  Gastrointestinal system: Abdomen is nondistended, soft and nontender. No organomegaly or masses felt. Normal bowel sounds heard. Central nervous system: Unable to assess as patient was not following any command. Extremities: Trace lower extremity edema, pulses intact and symmetrical. Psychiatry: Patient appears tired and lethargic.  DVT prophylaxis: Heparin Code Status: DNR Family  Communication: No family at bedside. Disposition Plan: Pending improvement/family decision.  Consultants:   Nephrology  Palliative  Procedures:  Antimicrobials:  Unasyn.  Data Reviewed: I have personally reviewed following labs and imaging studies  CBC: Recent Labs  Lab 05/27/19 0918 05/27/19 1613 05/28/19 1000  WBC 12.0* 8.9 6.4  HGB 10.6* 9.9* 9.2*  HCT 33.6* 31.8* 29.8*  MCV 73.0* 74.1* 74.9*  PLT 175 171 [id number]   Basic Metabolic Panel: Recent Labs  Lab 05/27/19 0918 05/27/19 1613 05/28/19 1000 05/29/19 1000  NA 136  --  137  --   K 5.0  --  4.2  --   CL 96*  --  98  --   CO2 28  --  27  --   GLUCOSE 117*  --  88  --   BUN 46*  --  38*  --   CREATININE 11.24* 11.58* 9.29*  --   CALCIUM 9.4  --  8.8*  --   MG 2.0 2.1  --   --   PHOS 1.9* 2.0*  --  2.8   GFR: Estimated Creatinine Clearance: 6.5 mL/min (A) (by C-G formula based on SCr of 9.29 mg/dL (H)). Liver Function Tests: Recent Labs  Lab 05/28/19 1000  AST 15  ALT 10  ALKPHOS 63  BILITOT 0.7  PROT 6.9  ALBUMIN 2.9*   No results for input(s): LIPASE, AMYLASE in the last 168 hours. No results for  input(s): AMMONIA in the last 168 hours. Coagulation Profile: No results for input(s): INR, PROTIME in the last 168 hours. Cardiac Enzymes: No results for input(s): CKTOTAL, CKMB, CKMBINDEX, TROPONINI in the last 168 hours. BNP (last 3 results) No results for input(s): PROBNP in the last 8760 hours. HbA1C: No results for input(s): HGBA1C in the last 72 hours. CBG: No results for input(s): GLUCAP in the last 168 hours. Lipid Profile: No results for input(s): CHOL, HDL, LDLCALC, TRIG, CHOLHDL, LDLDIRECT in the last 72 hours. Thyroid Function Tests: No results for input(s): TSH, T4TOTAL, FREET4, T3FREE, THYROIDAB in the last 72 hours. Anemia Panel: No results for input(s): VITAMINB12, FOLATE, FERRITIN, TIBC, IRON, RETICCTPCT in the last 72 hours. Sepsis Labs: Recent Labs  Lab 05/27/19 0918    PROCALCITON 0.42    Recent Results (from the past 240 hour(s))  SARS CORONAVIRUS 2 (TAT 6-24 HRS) Nasopharyngeal Nasopharyngeal Swab     Status: None   Collection Time: 05/27/19  9:54 AM   Specimen: Nasopharyngeal Swab  Result Value Ref Range Status   SARS Coronavirus 2 NEGATIVE NEGATIVE Final    Comment: (NOTE) SARS-CoV-2 target nucleic acids are NOT DETECTED. The SARS-CoV-2 RNA is generally detectable in upper and lower respiratory specimens during the acute phase of infection. Negative results do not preclude SARS-CoV-2 infection, do not rule out co-infections with other pathogens, and should not be used as the sole basis for treatment or other patient management decisions. Negative results must be combined with clinical observations, patient history, and epidemiological information. The expected result is Negative. Fact Sheet for Patients: SugarRoll.be Fact Sheet for Healthcare Providers: https://www.woods-mathews.com/ This test is not yet approved or cleared by the [place] FDA and  has been authorized for detection and/or diagnosis of SARS-CoV-2 by FDA under an Emergency Use Authorization (EUA). This EUA will remain  in effect (meaning this test can be used) for the duration of the COVID-19 declaration under Section 56 4(b)(1) of the Act, 21 U.S.C. section 360bbb-3(b)(1), unless the authorization is terminated or revoked sooner. Performed at [facility], [place] [address], [place], [place] [address]   Culture, blood (routine x 2)     Status: None (Preliminary result)   Collection Time: 05/27/19  4:13 PM   Specimen: BLOOD  Result Value Ref Range Status   Specimen Description BLOOD BLOOD RIGHT HAND  Final   Special Requests   Final    BOTTLES DRAWN AEROBIC AND ANAEROBIC Blood Culture adequate volume   Culture   Final    NO GROWTH 3 DAYS Performed at [facility], [address], [place], [place] [address]     Report Status PENDING  Incomplete  Culture, blood (routine x 2)     Status: None (Preliminary result)   Collection Time: 05/27/19  4:34 PM   Specimen: BLOOD  Result Value Ref Range Status   Specimen Description BLOOD RIGHT ANTECUBITAL  Final   Special Requests   Final    BOTTLES DRAWN AEROBIC AND ANAEROBIC Blood Culture adequate volume   Culture   Final    NO GROWTH 3 DAYS Performed at [facility], [address], [place], [place] [address]    Report Status PENDING  Incomplete     Radiology Studies: No results found.  Scheduled Meds: . allopurinol  100 mg Oral Daily  . aspirin EC  81 mg Oral Daily  . atorvastatin  80 mg Oral Daily  . calcium acetate  1,334 mg Oral TID WC  . Chlorhexidine  Gluconate Cloth  6 each Topical [id number]  . clopidogrel  75 mg Oral Q breakfast  . epoetin (EPOGEN/PROCRIT) injection  4,000 Units Intravenous Q T,Th,Sa-HD  . heparin  5,000 Units Subcutaneous Q8H  . levETIRAcetam  500 mg Oral Daily  . losartan  50 mg Oral Daily  . memantine  5 mg Oral Daily  . metoprolol succinate  12.5 mg Oral Once per day on Tue Thu Sat  . metoprolol succinate  25 mg Oral Once per day on Sun Mon Wed Fri  . mirtazapine  15 mg Oral QHS  . sodium chloride flush  3 mL Intravenous Q12H   Continuous Infusions: . ampicillin-sulbactam (UNASYN) IV 3 g (05/29/19 1713)     LOS: 3 days   Time spent: 35 minutes.    Lorella Nimrod, MD [facility] Pager [phone]  If 7PM-7AM, please contact night-coverage [email or web address] Password [facility] 05/30/2019, 1:53 PM   This record has been created using Systems analyst. Errors have been sought and corrected,but may not always be located. Such creation errors do not reflect on the standard of care.

## 2019-05-30 NOTE — Progress Notes (Signed)
[facility], [place] 05/30/19  Subjective:   LOS: 3 12/12 0701 - 12/13 0700 In: 330.4 [P.O.:240; IV Piggyback:90.4] Out: 500  Patient presented to the emergency room for weakness.  Recent hospitalization in November for right lower lobe pneumonia.  Admitted for possible aspiration component.  . Patient is more interactive today.  Able to answer questions appropriately Oriented to self.  Was able to tell me his date of birth States that he was able to eat his breakfast   Objective:  Vital signs in last 24 hours:  Temp:  [97.4 F (36.3 C)-98.8 F (37.1 C)] 98 F (36.7 C) (12/13 1115) Pulse Rate:  [71-76] 71 (12/13 1115) Resp:  [14-21] 18 (12/13 1115) BP: (118-183)/(67-83) 182/67 (12/13 1115) SpO2:  [94 %-100 %] 100 % (12/13 1115) Weight:  [68.6 kg-68.9 kg] 68.9 kg (12/13 0500)  Weight change:  Filed Weights   05/27/19 1750 05/29/19 1438 05/30/19 0500  Weight: 73.5 kg 68.6 kg 68.9 kg    Intake/Output:    Intake/Output Summary (Last 24 hours) at 05/30/2019 1141 Last data filed at 05/30/2019 1041 Gross per 24 hour  Intake 330.44 ml  Output 500 ml  Net -169.56 ml     Physical Exam: General: NAD  HEENT Anicteric, moist oral mucous membranes  Pulm/lungs  normal breathing effort on room air, clear to auscultation  CVS/Heart  no rub or gallop  Abdomen:   Soft, nontender  Extremities:  No peripheral edema  Neurologic:  More interactive today.  Able to answer some questions.  Skin:  No acute rashes     Basic Metabolic Panel:  Recent Labs  Lab 05/27/19 0918 05/27/19 1613 05/28/19 1000 05/29/19 1000  NA 136  --  137  --   K 5.0  --  4.2  --   CL 96*  --  98  --   CO2 28  --  27  --   GLUCOSE 117*  --  88  --   BUN 46*  --  38*  --   CREATININE 11.24* 11.58* 9.29*  --   CALCIUM 9.4  --  8.8*  --   MG 2.0 2.1  --   --   PHOS 1.9* 2.0*  --  2.8     CBC: Recent Labs  Lab 05/27/19 0918 05/27/19 1613 05/28/19 1000  WBC 12.0*  8.9 6.4  HGB 10.6* 9.9* 9.2*  HCT 33.6* 31.8* 29.8*  MCV 73.0* 74.1* 74.9*  PLT 175 171 156      Lab Results  Component Value Date   HEPBSAG NON REACTIVE 05/01/2019   HEPBSAB Reactive (A) 05/01/2019   HEPBIGM NON REACTIVE 05/01/2019      Microbiology:  Recent Results (from the past 240 hour(s))  SARS CORONAVIRUS 2 (TAT 6-24 HRS) Nasopharyngeal Nasopharyngeal Swab     Status: None   Collection Time: 05/27/19  9:54 AM   Specimen: Nasopharyngeal Swab  Result Value Ref Range Status   SARS Coronavirus 2 NEGATIVE NEGATIVE Final    Comment: (NOTE) SARS-CoV-2 target nucleic acids are NOT DETECTED. The SARS-CoV-2 RNA is generally detectable in upper and lower respiratory specimens during the acute phase of infection. Negative results do not preclude SARS-CoV-2 infection, do not rule out co-infections with other pathogens, and should not be used as the sole basis for treatment or other patient management decisions. Negative results must be combined with clinical observations, patient history, and epidemiological information. The expected result is Negative. Fact Sheet for Patients: SugarRoll.be Fact Sheet for  Healthcare Providers: https://www.woods-mathews.com/ This test is not yet approved or cleared by the [place] and  has been authorized for detection and/or diagnosis of SARS-CoV-2 by FDA under an Emergency Use Authorization (EUA). This EUA will remain  in effect (meaning this test can be used) for the duration of the COVID-19 declaration under Section 56 4(b)(1) of the Act, 21 U.S.C. section 360bbb-3(b)(1), unless the authorization is terminated or revoked sooner. Performed at [facility], [place] [address], [place], [place] [address]   Culture, blood (routine x 2)     Status: None (Preliminary result)   Collection Time: 05/27/19  4:13 PM   Specimen: BLOOD  Result Value Ref Range Status   Specimen Description BLOOD  BLOOD RIGHT HAND  Final   Special Requests   Final    BOTTLES DRAWN AEROBIC AND ANAEROBIC Blood Culture adequate volume   Culture   Final    NO GROWTH 3 DAYS Performed at [facility], [address], [place], [place] [address]    Report Status PENDING  Incomplete  Culture, blood (routine x 2)     Status: None (Preliminary result)   Collection Time: 05/27/19  4:34 PM   Specimen: BLOOD  Result Value Ref Range Status   Specimen Description BLOOD RIGHT ANTECUBITAL  Final   Special Requests   Final    BOTTLES DRAWN AEROBIC AND ANAEROBIC Blood Culture adequate volume   Culture   Final    NO GROWTH 3 DAYS Performed at [facility], [address], [place], [place] [address]    Report Status PENDING  Incomplete    Coagulation Studies: No results for input(s): LABPROT, INR in the last 72 hours.  Urinalysis: No results for input(s): COLORURINE, LABSPEC, PHURINE, GLUCOSEU, HGBUR, BILIRUBINUR, KETONESUR, PROTEINUR, UROBILINOGEN, NITRITE, LEUKOCYTESUR in the last 72 hours.  Invalid input(s): APPERANCEUR    Imaging: No results found.   Medications:   . ampicillin-sulbactam (UNASYN) IV 3 g (05/29/19 1713)   . allopurinol  100 mg Oral Daily  . aspirin EC  81 mg Oral Daily  . atorvastatin  80 mg Oral Daily  . calcium acetate  1,334 mg Oral TID WC  . Chlorhexidine Gluconate Cloth  6 each Topical Q0600  . clopidogrel  75 mg Oral Q breakfast  . epoetin (EPOGEN/PROCRIT) injection  4,000 Units Intravenous Q T,Th,Sa-HD  . heparin  5,000 Units Subcutaneous Q8H  . levETIRAcetam  500 mg Oral Daily  . losartan  50 mg Oral Daily  . memantine  5 mg Oral Daily  . metoprolol succinate  12.5 mg Oral Once per day on Tue Thu Sat  . metoprolol succinate  25 mg Oral Once per day on Sun Mon Wed Fri  . mirtazapine  15 mg Oral QHS  . sodium chloride flush  3 mL Intravenous Q12H   acetaminophen **OR** acetaminophen, ondansetron **OR** ondansetron (ZOFRAN) IV  Assessment/ Plan:   68 y.o. Asian ([place])  male with end stage renal disease on hemodialysis, history of renal transplant, hypertension, diabetes mellitus type II, peptic ulcer disease, gout, diastolic congestive heart failure.   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)   HTN (hypertension)   Anemia associated with chronic renal failure   Mixed hyperlipidemia   Seizure disorder (HCC)   Dementia (HCC)   FTT (failure to thrive) in adult   Dysphagia   History of CVA (cerebrovascular accident)  PRD N18.6 End stage renal disease Modality In-Center Hemodialysis Shift [facility]-1 [facility] Start Date 03/15/2014 FDODE 05/22/1995 Metrics Height 149.9 cm / 59 inches Last Tx Weight 68.1 kgs / 150 lbs BMI 30.32 Access Primary Access AV Graft Site Arm (Left lower) Placement Date 02/16/12 Treatment Details Time 195 Minutes Target Weight 68.5 Kg [facility] [facility]/   Results for HONEST, STARE (MRN [id number]) as of 05/27/2019 11:19  Ref. Range 05/01/2019 14:00  Hep B S Ab Latest Ref Range: NON REACTIVE  Reactive (A)    #. ESRD, TTS Next hemodialysis will be planned for Saturday if still requiring hospitalization Otherwise may go back to his normal dialysis schedule as outpatient    #. Anemia of CKD  Lab Results  Component Value Date   HGB 9.2 (L) 05/28/2019   Low dose EPO with HD  #. SHPTH  No results found for: PTH Lab Results  Component Value Date   PHOS 2.8 05/29/2019   Monitor calcium and phos level during this admission   #. Diabetes type 2 with CKD Hgb A1c MFr Bld (%)  Date Value  04/26/2019 5.8 (H)   #Weakness Chest x-ray shows cardiomegaly, opacity at left lung base - possible pneumonia. IV antibiotics as per internal medicine team    LOS: [place] 12/13/202011:41 AM  [place], [place]

## 2019-05-30 NOTE — Plan of Care (Signed)

## 2019-05-31 DIAGNOSIS — Z515 Encounter for palliative care: Secondary | ICD-10-CM

## 2019-05-31 DIAGNOSIS — Z7189 Other specified counseling: Secondary | ICD-10-CM

## 2019-05-31 MED ORDER — RENA-VITE PO TABS
1.0000 | ORAL_TABLET | Freq: Every day | ORAL | Status: DC
  Administered 2019-05-31: 22:00:00 1 via ORAL
  Filled 2019-05-31 (×2): qty 1

## 2019-05-31 MED ORDER — HYDRALAZINE HCL 25 MG PO TABS
25.0000 mg | ORAL_TABLET | Freq: Three times a day (TID) | ORAL | Status: DC
  Administered 2019-05-31 – 2019-06-01 (×3): 25 mg via ORAL
  Filled 2019-05-31 (×3): qty 1

## 2019-05-31 MED ORDER — NEPRO/CARBSTEADY PO LIQD
237.0000 mL | Freq: Three times a day (TID) | ORAL | Status: DC
  Administered 2019-05-31 (×2): 237 mL via ORAL

## 2019-05-31 MED ORDER — LABETALOL HCL 5 MG/ML IV SOLN
10.0000 mg | Freq: Four times a day (QID) | INTRAVENOUS | Status: DC | PRN
  Administered 2019-05-31 – 2019-06-01 (×2): 10 mg via INTRAVENOUS
  Filled 2019-05-31 (×2): qty 4

## 2019-05-31 NOTE — Care Management Important Message (Signed)
Important Message  Patient Details  Name: Jarett Hammack MRN: [id number] Date of Birth: [birth year]   Medicare Important Message Given:  Yes  Reviewed verbally over phone with daughter, [name].  Aware of right.  Requested copy be sent to her via email: [email or web address].  Confirmed address and copy sent securely.  Also, upon request, a paper copy left in patient's room with other daughter, [name].     Dannette Barbara 05/31/2019, 3:30 PM

## 2019-05-31 NOTE — Plan of Care (Signed)

## 2019-05-31 NOTE — Progress Notes (Signed)
PT Cancellation Note  Patient Details Name: [name] MRN: [id number] DOB: Jul 30, 1950   Cancelled Treatment:    Reason Eval/Treat Not Completed: Other (comment)(Attempted to initiate PT treatment, pt able to intermittently open eyes and follow commands, but often returned to eyes closed, resting. PT to follow up as able.)   Lieutenant Diego PT, DPT 3:25 PM,05/31/19 [phone]

## 2019-05-31 NOTE — Progress Notes (Signed)
PROGRESS NOTE    [name]  [id number] DOB: 1951/02/26 DOA: 05/27/2019 PCP: Kirk Ruths, MD   Brief Narrative:  [name] is a 68 y.o. 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  Patient was recently discharged on 11/15 after an admission for right lower lobe pneumonia with sepsis physiology.  Due to patient's significant physical debility and recurrent encephalopathy extensive discussions were held with the family guarding placement in skilled nursing facility but they declined.  It was documented that patient will be high risk for admission with significant needs at home if patient's family continue to decline SNF.  Patient presented to ED from home with generalized weakness. Having mild leukocytosis, procalcitonin of 0.42, COVID-19 negative and chest x-ray with persistent left lower lobe infiltrate.  Subjective: Patient appears lethargic.  Just opening eyes and nodding his head.  Not communicating.  Denies any pain.  Does not want to eat anything.  Assessment & Plan:   Principal Problem:   Pneumonia Active Problems:   ESRD on dialysis ([place])   Type 1 diabetes mellitus with other diabetic kidney complication (HCC)   HTN (hypertension)   Anemia associated with chronic renal failure   Mixed hyperlipidemia   Seizure disorder (HCC)   Dementia (HCC)   FTT (failure to thrive) in adult   Dysphagia   History of CVA (cerebrovascular accident)  Persistent left lower lobe pneumonia.  Patient is primarily bedbound and require a lot of help for mobilization even transferring from bed to chair. Chest x-ray with persistent left lower lobe pneumonia.  There was some concern for aspiration. Concern for some obstruction.  And has a CT chest done in May 2020 which also  shows left lower lobe infiltrate/scarring. Patient to have mild leukocytosis and procalcitonin is mildly elevated although much improved from his prior numbers. Patient has a very poor baseline functional status.  Do not know how beneficial it is to involve pulmonary for a possible bronchoscopy. Per chart review patient has similar appearances on multiple occasions.  Talked with his nephrologist Dr. Candiss Norse who also told me that patient lethargic like this on multiple occasions for dialysis.  There are days when he is little more alert, otherwise mostly very sleepy and tired and does not communicate well. He was started on Zosyn which was discontinued next day and patient was started on Unasyn. -Continue Unasyn. -Palliative care consult to discuss further goals of care as of his poor functional status. -PT/OT evaluation. -Apparently family now wants him to go to SNF - His prognosis seems poor.  ESRD (TTS).   No morning labs as patient is hard stick and unable to obtain a sample after multiple attempts. -Continue with his scheduled dialysis.  Dementia/failure to thrive in adult/mild dysphagia. 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 Talked with his son Nicole Kindred on phone.  Patient has a a lot of decline in his functional status over the past few months. Complains of coughing with eating.  He requires multiple family members to mobilize or help with feeding.  He does not communicate most of the time.  -  Now family is open to look for a skilled nursing facility with hospice care. -Palliative care consult was placed.  Diabetes mellitus 2 without current hyperglycemia -Patient  has poor oral intake chronically therefore no longer requiring diabetes medications -Hemoglobin A1c November 9 was 5.8 which is more consistent with prediabetes. CBG remained between 99-150. -Continue to monitor.  Hypertension.  Blood pressure remained elevated. -Increase home dose of losartan to 50 mg daily.  Seizure disorder.  No witnessed seizure-like activity. Prior EEGs during previous admission shows mild to moderate encephalopathy without any seizure-like activity. -Keppra level still pending. -Continue Keppra.  History of prior CVA with left hemiplegia.  No new focal deficit.  Repeat CT head was without any acute intracranial abnormality.  Objective: Vitals:   05/31/19 0414 05/31/19 0449 05/31/19 0802 05/31/19 0946  BP: (!) 179/85 (!) 152/80 (!) 149/69 (!) 163/80  Pulse: 74 78 77   Resp: 16  17   Temp: (!) 97.5 F (36.4 C)  98.8 F (37.1 C)   TempSrc: Oral     SpO2: 98%  98%   Weight:      Height:        Intake/Output Summary (Last 24 hours) at 05/31/2019 1451 Last data filed at 05/30/2019 2251 Gross per 24 hour  Intake 123 ml  Output --  Net 123 ml   Filed Weights   05/27/19 1750 05/29/19 1438 05/30/19 0500  Weight: 73.5 kg 68.6 kg 68.9 kg    Examination:  General exam: Chronically ill-appearing gentleman, following simple commands today, in no acute distress. Respiratory system: Clear to auscultation. Respiratory effort normal. Cardiovascular system: S1 & S2 heard, RRR. No JVD, murmurs, rubs, gallops or clicks.  Gastrointestinal system: Abdomen is nondistended, soft and nontender. No organomegaly or masses felt. Normal bowel sounds heard. Central nervous system: Unable to assess as patient was not following any command. Extremities: Trace lower extremity edema, pulses intact and symmetrical. Psychiatry: Patient appears tired and lethargic.  DVT prophylaxis: Heparin Code Status: DNR Family Communication: Updated daughter on phone.  Discussed about his  poor baseline functional status.  She will discuss among other family members whether to take an aggressive approach which will be fruitless or make him comfortable. Disposition Plan: Pending improvement/family decision.  Consultants:   Nephrology  Palliative  Procedures:  Antimicrobials:  Unasyn.  Data Reviewed: I have personally reviewed following labs and imaging studies  CBC: Recent Labs  Lab 05/27/19 0918 05/27/19 1613 05/28/19 1000  WBC 12.0* 8.9 6.4  HGB 10.6* 9.9* 9.2*  HCT 33.6* 31.8* 29.8*  MCV 73.0* 74.1* 74.9*  PLT 175 171 [id number]   Basic Metabolic Panel: Recent Labs  Lab 05/27/19 0918 05/27/19 1613 05/28/19 1000 05/29/19 1000  NA 136  --  137  --   K 5.0  --  4.2  --   CL 96*  --  98  --   CO2 28  --  27  --   GLUCOSE 117*  --  88  --   BUN 46*  --  38*  --   CREATININE 11.24* 11.58* 9.29*  --   CALCIUM 9.4  --  8.8*  --   MG 2.0 2.1  --   --   PHOS 1.9* 2.0*  --  2.8   GFR: Estimated Creatinine Clearance: 6.5 mL/min (A) (by C-G formula based on SCr of 9.29 mg/dL (H)). Liver Function Tests: Recent Labs  Lab 05/28/19 1000  AST 15  ALT 10  ALKPHOS 63  BILITOT 0.7  PROT 6.9  ALBUMIN 2.9*   No results for input(s): LIPASE, AMYLASE in the last 168 hours. No results for input(s): AMMONIA in the last 168 hours. Coagulation Profile: No results for input(s): INR, PROTIME in the last 168 hours. Cardiac Enzymes: No results for input(s): CKTOTAL, CKMB, CKMBINDEX, TROPONINI in the last 168 hours. BNP (last 3 results) No results for input(s): PROBNP in the last 8760 hours. HbA1C: No results for input(s): HGBA1C in the last 72 hours. CBG: No results for input(s): GLUCAP in the last 168 hours. Lipid Profile: No results for input(s): CHOL, HDL, LDLCALC, TRIG, CHOLHDL, LDLDIRECT in the last 72 hours. Thyroid Function Tests: No results for input(s): TSH, T4TOTAL, FREET4, T3FREE, THYROIDAB in the last 72 hours. Anemia Panel: No results for input(s):  VITAMINB12, FOLATE, FERRITIN, TIBC, IRON, RETICCTPCT in the last 72 hours. Sepsis Labs: Recent Labs  Lab 05/27/19 0918  PROCALCITON 0.42    Recent Results (from the past 240 hour(s))  SARS CORONAVIRUS 2 (TAT 6-24 HRS) Nasopharyngeal Nasopharyngeal Swab     Status: None   Collection Time: 05/27/19  9:54 AM   Specimen: Nasopharyngeal Swab  Result Value Ref Range Status   SARS Coronavirus 2 NEGATIVE NEGATIVE Final    Comment: (NOTE) SARS-CoV-2 target nucleic acids are NOT DETECTED. The SARS-CoV-2 RNA is generally detectable in upper and lower respiratory specimens during the acute phase of infection. Negative results do not preclude SARS-CoV-2 infection, do not rule out co-infections with other pathogens, and should not be used as the sole basis for treatment or other patient management decisions. Negative results must be combined with clinical observations, patient history, and epidemiological information. The expected result is Negative. Fact Sheet for Patients: SugarRoll.be Fact Sheet for Healthcare Providers: https://www.woods-mathews.com/ This test is not yet approved or cleared by the [place] FDA and  has been authorized for detection and/or diagnosis of SARS-CoV-2 by FDA under an Emergency Use Authorization (EUA). This EUA will remain  in effect (meaning this test can be used) for the duration of the COVID-19 declaration under Section 56 4(b)(1) of the Act, 21 U.S.C. section 360bbb-3(b)(1), unless the authorization is terminated or revoked sooner. Performed at [facility], [place] [address], [place], [place] [address]   Culture, blood (routine x 2)     Status: None (Preliminary result)   Collection Time: 05/27/19  4:13 PM   Specimen: BLOOD  Result Value Ref Range Status   Specimen Description BLOOD BLOOD RIGHT HAND  Final   Special Requests   Final    BOTTLES DRAWN AEROBIC AND ANAEROBIC Blood Culture adequate volume    Culture   Final    NO GROWTH 4 DAYS Performed at [facility], [address], [place], [place] [address]    Report Status PENDING  Incomplete  Culture, blood (routine x 2)     Status: None (Preliminary result)   Collection Time: 05/27/19  4:34 PM   Specimen: BLOOD  Result Value Ref Range Status   Specimen Description BLOOD RIGHT ANTECUBITAL  Final   Special Requests   Final    BOTTLES DRAWN AEROBIC AND ANAEROBIC Blood Culture adequate volume   Culture   Final    NO GROWTH 4 DAYS Performed at [facility], [address], [place], [place] [address]    Report Status PENDING  Incomplete     Radiology Studies: No results found.  Scheduled Meds: . allopurinol  100 mg Oral Daily  . aspirin EC  81 mg Oral Daily  .  atorvastatin  80 mg Oral Daily  . calcium acetate  1,334 mg Oral TID WC  . Chlorhexidine Gluconate Cloth  6 each Topical Q0600  . clopidogrel  75 mg Oral Q breakfast  . epoetin (EPOGEN/PROCRIT) injection  4,000 Units Intravenous Q T,Th,Sa-HD  . heparin  5,000 Units Subcutaneous Q8H  . levETIRAcetam  500 mg Oral Daily  . losartan  50 mg Oral Daily  . mouth rinse  15 mL Mouth Rinse BID  . memantine  5 mg Oral Daily  . metoprolol succinate  12.5 mg Oral Once per day on Tue Thu Sat  . metoprolol succinate  25 mg Oral Once per day on Sun Mon Wed Fri  . mirtazapine  15 mg Oral QHS  . sodium chloride flush  3 mL Intravenous Q12H   Continuous Infusions: . ampicillin-sulbactam (UNASYN) IV 3 g (05/30/19 1636)     LOS: 4 days   Time spent: 35 minutes.    Lorella Nimrod, MD [facility] Pager [phone]  If 7PM-7AM, please contact night-coverage [email or web address] Password [facility] 05/31/2019, 2:51 PM   This record has been created using Systems analyst. Errors have been sought and corrected,but may not always be located. Such creation errors do not reflect on the standard of care.

## 2019-05-31 NOTE — Progress Notes (Signed)
Patient's BP 179/85, HR 74, received verbal order from provider Rufina Falco NP to administer lasix 40mg  one time.

## 2019-05-31 NOTE — Consult Note (Signed)
Pharmacy Antibiotic Note  [name] is a 68 y.o. male admitted on 05/27/2019 with aspiration pneumonia.  Pharmacy has been consulted for Unasyn dosing.  Plan: Day 5 Abx therapy. Day 4 of Unasyn. Continue Unasyn 3g q24h (give after HD on HD days)  Height: 5\' 4"  (162.6 cm) Weight: 151 lb 14.4 oz (68.9 kg) IBW/kg (Calculated) : 59.2  Temp (24hrs), Avg:98.2 F (36.8 C), Min:97.5 F (36.4 C), Max:98.8 F (37.1 C)  Recent Labs  Lab 05/27/19 0918 05/27/19 1613 05/28/19 1000  WBC 12.0* 8.9 6.4  CREATININE 11.24* 11.58* 9.29*    Estimated Creatinine Clearance: 6.5 mL/min (A) (by C-G formula based on SCr of 9.29 mg/dL (H)).    Allergies  Allergen Reactions  . Ivp Dye [Iodinated Diagnostic Agents] Other (See Comments)    Pt denied  . Metrizamide Other (See Comments)    Contains iodine    Antimicrobials this admission: CTX/Azithromycin 12/10 x 1 Zosyn 12/10 >> 12/11 Unasyn 12/11 >>    Microbiology results: 12/10 BCx: NG TD   12/10 COVID NEG  Thank you for allowing pharmacy to be a part of this patient's care.  Pernell Dupre, PharmD, BCPS Clinical Pharmacist 05/31/2019 8:10 AM

## 2019-05-31 NOTE — Progress Notes (Signed)
Initial Nutrition Assessment  DOCUMENTATION CODES:   Non-severe (moderate) malnutrition in context of chronic illness  INTERVENTION:  Provide Nepro Shake po TID, each supplement provides 425 kcal and 19 grams protein.  Provide Rena-vite QHS.   NUTRITION DIAGNOSIS:   Moderate Malnutrition related to chronic illness(dementia, ESRD on HD, CHF) as evidenced by mild fat depletion, mild muscle depletion.  GOAL:   Patient will meet greater than or equal to 90% of their needs  MONITOR:   PO intake, Supplement acceptance, Labs, Weight trends, I & O's  REASON FOR ASSESSMENT:   Malnutrition Screening Tool    ASSESSMENT:   68 year old male with PMHx of HTN, DM, GERD, gout, ESRD on HD, CHF, dementia, anemia of chronic disease admitted with left lower lobe PNA, FTT.   Met with patient and his daughter at bedside. They are known to this RD from an assessment two years ago. Patient was too lethargic today to provide any history through interpreter tablet. His daughter was trying to feed him lunch but he was pocketing the food and did not seem to want anything to eat. Daughter reports he has had poor PO intake since 1-2 days PTA. Prior to that he was still able to eat fairly well but his intake had declined overall from baseline. He had been drinking Nepro at home between meals. She would like him to start on Nepro here to help meet calorie/protein needs. Noted plan was for PMT consult to discuss goals of care.  Patient's UBW had been around 150 lbs. Daughter reports she is unsure of his recent weight trend but she does not believe he has lost any weight. She is unsure of what his recent post-dialysis dry weights have been as family cannot go into dialysis now due to pandemic.  Medications reviewed and include: allopurinol, Phoslo 1334 mg TID, Epogen 4000 units during HD, Keppra, Remeron 15 mg QHS, Unasyn.  Labs reviewed: BUN 38, Creatinine 9.29.  NUTRITION - FOCUSED PHYSICAL EXAM:    Most  Recent Value  Orbital Region  Mild depletion  Upper Arm Region  Moderate depletion  Thoracic and Lumbar Region  Unable to assess  Buccal Region  Mild depletion  Temple Region  Mild depletion  Clavicle Bone Region  Moderate depletion  Clavicle and Acromion Bone Region  Mild depletion  Scapular Bone Region  Unable to assess  Dorsal Hand  Mild depletion  Patellar Region  Unable to assess  Anterior Thigh Region  Unable to assess  Posterior Calf Region  Unable to assess  Edema (RD Assessment)  Mild  Hair  Reviewed  Eyes  Unable to assess  Mouth  Unable to assess  Skin  Reviewed  Nails  Reviewed     Diet Order:   Diet Order            DIET DYS 3 Room service appropriate? Yes; Fluid consistency: Thin  Diet effective now             EDUCATION NEEDS:   No education needs have been identified at this time  Skin:  Skin Assessment: Reviewed RN Assessment  Last BM:  Unknown  Height:   Ht Readings from Last 1 Encounters:  05/27/19 _0  (1.626 m)   Weight:   Wt Readings from Last 1 Encounters:  05/30/19 68.9 kg   Ideal Body Weight:  59.1 kg  BMI:  Body mass index is 26.07 kg/m.  Estimated Nutritional Needs:   Kcal:  1800-2000  Protein:  90-100 grams  Fluid:  UOP + 1 L  Jacklynn Barnacle, MS, RD, LDN Office: [phone] Pager: [phone] After Hours/Weekend Pager: [phone]

## 2019-05-31 NOTE — Consult Note (Signed)
Consultation Note Date: 05/31/2019   Patient Name: [name]  DOB: Mar 27, 1951  MRN: [id number]  Age / Sex: 68 y.o., male  PCP: Kirk Ruths, MD Referring Physician: Lorella Nimrod, MD  Reason for Consultation: Establishing goals of care  HPI/Patient Profile: 68 year old male with ESRD, chronic diastolic CHF, seizure disorder, CAD with prior CABG, prior CVAs with left hemiparesis, dementia, who came in for weakness.  He recently had a hospitalization on 11/15 for right lower lobe pneumonia with sepsis physiology.  Chest x-ray on admission showed left lung base opacity and he has a slightly elevated white count.  Clinical Assessment and Goals of Care: Patient is resting in bed with eyes closed. He does not open them during my visit. Daughter is at bedside. She states her father is married with 2 children.   She last remembers her father normal, walking, and fully functional mentally and physically 2 years ago. She cannot tell me the time line of decline, but states now he has good days and bad days. She notes that during this 2 years, he had a stroke and has had several hospital admissions. On good days, he can feed himself and help maneuver in bed and assist with transferring into wheelchair. on bad days, he sleeps and is not interactive. She states he no longer has meaningful conversations except one they had recently. He is sometimes incontinent, but mostly will let you know when he needs to go to the bathroom. His appetite has been poor for at least the past few weeks. She notes that it has been poor today.    We discussed his diagnoses, prognosis, GOC, EOL wishes disposition and options.  A detailed discussion was had today regarding advanced directives.  Concepts specific to code status, artifical feeding and hydration, IV antibiotics and rehospitalization were discussed.  The difference between an  aggressive medical intervention path and a comfort care path was discussed.  Values and goals of care important to patient and family were attempted to be elicited.  Discussed limitations of medical interventions to prolong quality of life in some situations and discussed the concept of human mortality.  She states thus far, the family has only wanted assistance with keeping him at home and transporting him to dialysis. She states to her knowledge the family has not considered a shift to comfort care; she states she understands his declining status and concerns for quality of life.  Plans for a family conversation tomorrow at 12:00.     SUMMARY OF RECOMMENDATIONS   Family conversation tomorrow at 12:00.    Prognosis:   Poor overall. < 2 weeks if shifted to comfort care.       Primary Diagnoses: Present on Admission: . Pneumonia . 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 HTN (hypertension) . Anemia associated with chronic renal failure . Mixed hyperlipidemia . Dementia ([place]) . FTT (failure to thrive) in adult   I have reviewed the medical record, interviewed the patient and family, and examined the patient. The following  aspects are pertinent.  Past Medical History:  Diagnosis Date  . Chronic diastolic congestive heart failure ([place])   . Chronic disease anemia   . Dementia ([place])   . ESRD (end stage renal disease) on dialysis ([place])    "[facility] [place]; [name]" (09/29/2014)  . GERD (gastroesophageal reflux disease)   . Gout   . High cholesterol   . History of blood transfusion    "related to anemia"  . History of stomach ulcers   . Hypertension   . Pneumonia   . Type II diabetes mellitus ([place])    Social History   Socioeconomic History  . Marital status: Married    Spouse name: Not on file  . Number of children: Not on file  . Years of education: Not on file  . Highest education level: Not on file  Occupational History  . Not on  file  Tobacco Use  . Smoking status: Former Smoker    Types: Cigarettes  . Smokeless tobacco: Never Used  . Tobacco comment: "quit smoking cigarettes in the 1980's"  Substance and Sexual Activity  . Alcohol use: No  . Drug use: No  . Sexual activity: Not Currently  Other Topics Concern  . Not on file  Social History Narrative  . Not on file   Social Determinants of Health   Financial Resource Strain:   . Difficulty of Paying Living Expenses: Not on file  Food Insecurity:   . Worried About Charity fundraiser in the Last Year: Not on file  . Ran Out of Food in the Last Year: Not on file  Transportation Needs:   . Lack of Transportation (Medical): Not on file  . Lack of Transportation (Non-Medical): Not on file  Physical Activity:   . Days of Exercise per Week: Not on file  . Minutes of Exercise per Session: Not on file  Stress:   . Feeling of Stress : Not on file  Social Connections:   . Frequency of Communication with Friends and Family: Not on file  . Frequency of Social Gatherings with Friends and Family: Not on file  . Attends Religious Services: Not on file  . Active Member of Clubs or Organizations: Not on file  . Attends Archivist Meetings: Not on file  . Marital Status: Not on file   Family History  Problem Relation Age of Onset  . Hypertension Mother   . Stroke Mother   . Hypertension Father   . Diabetes Mellitus II Father   . Asthma Father    Scheduled Meds: . allopurinol  100 mg Oral Daily  . aspirin EC  81 mg Oral Daily  . atorvastatin  80 mg Oral Daily  . calcium acetate  1,334 mg Oral TID WC  . Chlorhexidine Gluconate Cloth  6 each Topical Q0600  . clopidogrel  75 mg Oral Q breakfast  . epoetin (EPOGEN/PROCRIT) injection  4,000 Units Intravenous Q T,Th,Sa-HD  . heparin  5,000 Units Subcutaneous Q8H  . hydrALAZINE  25 mg Oral Q8H  . levETIRAcetam  500 mg Oral Daily  . losartan  50 mg Oral Daily  . mouth rinse  15 mL Mouth Rinse BID  .  memantine  5 mg Oral Daily  . metoprolol succinate  12.5 mg Oral Once per day on Tue Thu Sat  . metoprolol succinate  25 mg Oral Once per day on Sun Mon Wed Fri  . mirtazapine  15 mg Oral QHS  . sodium chloride flush  3  mL Intravenous Q12H   Continuous Infusions: . ampicillin-sulbactam (UNASYN) IV 3 g (05/30/19 1636)   PRN Meds:.acetaminophen **OR** acetaminophen, labetalol, ondansetron **OR** ondansetron (ZOFRAN) IV Medications Prior to Admission:  Prior to Admission medications   Medication Sig Start Date End Date Taking? Authorizing Provider  allopurinol (ZYLOPRIM) 100 MG tablet Take 100 mg by mouth daily. 09/23/14  Yes [provider]  aspirin 81 MG EC tablet Take 81 mg by mouth daily. 09/23/14  Yes [provider]  atorvastatin (LIPITOR) 80 MG tablet Take 80 mg by mouth daily. 09/18/18  Yes [provider]  calcium acetate (PHOSLO) 667 MG capsule Take 1,334 mg by mouth 3 (three) times daily with meals.   Yes [provider]  clopidogrel (PLAVIX) 75 MG tablet Take 1 tablet (75 mg total) by mouth daily with breakfast. 10/02/14  Yes [name], MD  levETIRAcetam (KEPPRA) 100 MG/ML solution Take 5 mLs (500 mg total) by mouth daily. 05/03/19  Yes Spongberg, Audie Pinto, MD  losartan (COZAAR) 25 MG tablet Take 1 tablet (25 mg total) by mouth daily. 09/18/16  Yes Demetrios Loll, MD  memantine [facility]) 5 MG tablet Take 5 mg by mouth daily.    Yes [provider]  metoprolol succinate (TOPROL-XL) 25 MG 24 hr tablet Take 12.5-25 mg by mouth See admin instructions. Take 25 mg by mouth on non-dialysis days and 12.5 mg on dialysis days (Tuesday, Thursday and Saturday) after dialysis.    Yes [provider]  mirtazapine (REMERON) 15 MG tablet Take 15 mg by mouth at bedtime.   Yes [provider]  nitroGLYCERIN (NITROSTAT) 0.4 MG SL tablet Place 1 tablet (0.4 mg total) under the tongue every 5 (five) minutes x 3 doses as needed for chest pain.  02/26/18  Yes Demetrios Loll, MD   Allergies  Allergen Reactions  . Ivp Dye [Iodinated Diagnostic Agents] Other (See Comments)    Pt denied  . Metrizamide Other (See Comments)    Contains iodine   Review of Systems  Unable to perform ROS   Physical Exam Neurological:     Mental Status: He is alert.     Vital Signs: BP (!) 163/80 (BP Location: Right Arm)   Pulse 77   Temp 98.8 F (37.1 C)   Resp 17   Ht 5\' 4"  (1.626 m)   Wt 68.9 kg   SpO2 98%   BMI 26.07 kg/m  Pain Scale: 0-10   Pain Score: 0-No pain   SpO2: SpO2: 98 % O2 Device:SpO2: 98 % O2 Flow Rate: .   IO: Intake/output summary:   Intake/Output Summary (Last 24 hours) at 05/31/2019 1525 Last data filed at 05/30/2019 2251 Gross per 24 hour  Intake 123 ml  Output --  Net 123 ml    LBM: Last BM Date: (unknown) Baseline Weight: Weight: 73.5 kg Most recent weight: Weight: 68.9 kg     Palliative Assessment/Data: 30% at best     Time In: 2:40 Time Out: 3:30 Time Total: 50 min Greater than 50%  of this time was spent counseling and coordinating care related to the above assessment and plan.  Signed by: Asencion Gowda, NP   Please contact Palliative Medicine Team phone at [phone] for questions and concerns.  For individual provider: See Shea Evans

## 2019-05-31 NOTE — Progress Notes (Signed)
OT Cancellation Note  Patient Details Name: [name] MRN: [id number] DOB: 08-21-1950   Cancelled Treatment:    Reason Eval/Treat Not Completed: Fatigue/lethargy limiting ability to participate  OT consult received and chart reviewed. Pt with minimal eye opening when therapist presents. OT does attempt to call translator to assist in communicating with pt and motivating to gather basic assessment-[name] (ID # [id number]) with [employer], but pt with minimal responsiveness. Pt's dtr present and attempting to gently encourage pt as well to no avail at this time. OT attempts to improve pt positioning including elevating L UE and more neutral cervical spine position (pt rests with neck in L lateral rotation and flexion). Will f/u for more formal OT evaluation as pt becomes more able to participate.   Gerrianne Scale, [place], OTR/L ascom [phone] 05/31/19, 2:31 PM

## 2019-05-31 NOTE — Progress Notes (Signed)
[facility], [place] 05/31/19  Subjective:  Patient resting comfortably in bed. Due for hemodialysis tomorrow. Overall remains quite weak.    Objective:  Vital signs in last 24 hours:  Temp:  [97.5 F (36.4 C)-98.8 F (37.1 C)] 98.8 F (37.1 C) (12/14 0802) Pulse Rate:  [72-78] 73 (12/14 1600) Resp:  [16-17] 17 (12/14 0802) BP: (149-184)/(69-85) 161/74 (12/14 1600) SpO2:  [98 %] 98 % (12/14 0802)  Weight change:  Filed Weights   05/27/19 1750 05/29/19 1438 05/30/19 0500  Weight: 73.5 kg 68.6 kg 68.9 kg    Intake/Output:    Intake/Output Summary (Last 24 hours) at 05/31/2019 1647 Last data filed at 05/30/2019 2251 Gross per 24 hour  Intake 123 ml  Output --  Net 123 ml     Physical Exam: General: NAD  HEENT Anicteric, moist oral mucous membranes  Pulm/lungs basilar rales, normal effort  CVS/Heart no rub or gallop  Abdomen:  Soft, nontender, bowel sounds present  Extremities: No peripheral edema  Neurologic: Awake, alert, resting comfortably  Skin: No acute rashes     Basic Metabolic Panel:  Recent Labs  Lab 05/27/19 0918 05/27/19 1613 05/28/19 1000 05/29/19 1000  NA 136  --  137  --   K 5.0  --  4.2  --   CL 96*  --  98  --   CO2 28  --  27  --   GLUCOSE 117*  --  88  --   BUN 46*  --  38*  --   CREATININE 11.24* 11.58* 9.29*  --   CALCIUM 9.4  --  8.8*  --   MG 2.0 2.1  --   --   PHOS 1.9* 2.0*  --  2.8     CBC: Recent Labs  Lab 05/27/19 0918 05/27/19 1613 05/28/19 1000  WBC 12.0* 8.9 6.4  HGB 10.6* 9.9* 9.2*  HCT 33.6* 31.8* 29.8*  MCV 73.0* 74.1* 74.9*  PLT 175 171 156      Lab Results  Component Value Date   HEPBSAG NON REACTIVE 05/01/2019   HEPBSAB Reactive (A) 05/01/2019   HEPBIGM NON REACTIVE 05/01/2019      Microbiology:  Recent Results (from the past 240 hour(s))  SARS CORONAVIRUS 2 (TAT 6-24 HRS) Nasopharyngeal Nasopharyngeal Swab     Status: None   Collection Time: 05/27/19  9:54 AM    Specimen: Nasopharyngeal Swab  Result Value Ref Range Status   SARS Coronavirus 2 NEGATIVE NEGATIVE Final    Comment: (NOTE) SARS-CoV-2 target nucleic acids are NOT DETECTED. The SARS-CoV-2 RNA is generally detectable in upper and lower respiratory specimens during the acute phase of infection. Negative results do not preclude SARS-CoV-2 infection, do not rule out co-infections with other pathogens, and should not be used as the sole basis for treatment or other patient management decisions. Negative results must be combined with clinical observations, patient history, and epidemiological information. The expected result is Negative. Fact Sheet for Patients: SugarRoll.be Fact Sheet for Healthcare Providers: https://www.woods-mathews.com/ This test is not yet approved or cleared by the [place] FDA and  has been authorized for detection and/or diagnosis of SARS-CoV-2 by FDA under an Emergency Use Authorization (EUA). This EUA will remain  in effect (meaning this test can be used) for the duration of the COVID-19 declaration under Section [place] 4(b)(1) of the Act, 21 U.S.C. section 360bbb-3(b)(1), unless the authorization is terminated or revoked sooner. Performed at [facility], [place],  [place] [address]   Culture, blood (routine x 2)     Status: None (Preliminary result)   Collection Time: 05/27/19  4:13 PM   Specimen: BLOOD  Result Value Ref Range Status   Specimen Description BLOOD BLOOD RIGHT HAND  Final   Special Requests   Final    BOTTLES DRAWN AEROBIC AND ANAEROBIC Blood Culture adequate volume   Culture   Final    NO GROWTH 4 DAYS Performed at [facility], [address] [place], [place] [address]    Report Status PENDING  Incomplete  Culture, blood (routine x 2)     Status: None (Preliminary result)   Collection Time: 05/27/19  4:34 PM   Specimen: BLOOD  Result Value Ref Range Status    Specimen Description BLOOD RIGHT ANTECUBITAL  Final   Special Requests   Final    BOTTLES DRAWN AEROBIC AND ANAEROBIC Blood Culture adequate volume   Culture   Final    NO GROWTH 4 DAYS Performed at [facility], [address] [place], [place] [address]    Report Status PENDING  Incomplete    Coagulation Studies: No results for input(s): LABPROT, INR in the last 72 hours.  Urinalysis: No results for input(s): COLORURINE, LABSPEC, PHURINE, GLUCOSEU, HGBUR, BILIRUBINUR, KETONESUR, PROTEINUR, UROBILINOGEN, NITRITE, LEUKOCYTESUR in the last 72 hours.  Invalid input(s): APPERANCEUR    Imaging: No results found.   Medications:   . ampicillin-sulbactam (UNASYN) IV 3 g (05/31/19 1623)   . allopurinol  100 mg Oral Daily  . aspirin EC  81 mg Oral Daily  . atorvastatin  80 mg Oral Daily  . calcium acetate  1,334 mg Oral TID WC  . Chlorhexidine Gluconate Cloth  6 each Topical Q0600  . clopidogrel  75 mg Oral Q breakfast  . epoetin (EPOGEN/PROCRIT) injection  4,000 Units Intravenous Q T,Th,Sa-HD  . feeding supplement (NEPRO CARB STEADY)  237 mL Oral TID BM  . heparin  5,000 Units Subcutaneous Q8H  . hydrALAZINE  25 mg Oral Q8H  . levETIRAcetam  500 mg Oral Daily  . losartan  50 mg Oral Daily  . mouth rinse  15 mL Mouth Rinse BID  . memantine  5 mg Oral Daily  . metoprolol succinate  12.5 mg Oral Once per day on Tue Thu Sat  . metoprolol succinate  25 mg Oral Once per day on Sun Mon Wed Fri  . mirtazapine  15 mg Oral QHS  . multivitamin  1 tablet Oral QHS  . sodium chloride flush  3 mL Intravenous Q12H   acetaminophen **OR** acetaminophen, labetalol, ondansetron **OR** ondansetron (ZOFRAN) IV  Assessment/ Plan:  68 y.o. Asian ([place])  Hobbs with end stage renal disease on hemodialysis, history of renal transplant, hypertension, diabetes mellitus type II, peptic ulcer disease, gout, diastolic congestive heart failure.   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 ([facility])   HTN (hypertension)   Anemia associated with chronic renal failure   Mixed hyperlipidemia   Seizure disorder ([facility])   Dementia ([facility])   FTT (failure to thrive) in adult   Dysphagia   History of CVA (cerebrovascular accident)  PRD N18.6 End stage renal disease Modality In-Center Hemodialysis Shift [facility]-1 [facility] Start Date 03/15/2014 FDODE 05/22/1995 Metrics Height 149.9 cm / 59 inches Last Tx Weight 68.1 kgs / 150 lbs BMI 30.32 Access Primary Access AV Graft Site Arm (Left lower) Placement  Date 02/16/12 Treatment Details Time 195 Minutes Target Weight 68.5 Kg [facility] [facility]/   Results for ELWIN, HOLLANDS (MRN [id number]) as of 05/27/2019 11:19  Ref. Range 05/01/2019 14:00  Hep B S Ab Latest Ref Range: NON REACTIVE  Reactive (A)    #. ESRD, TTS Patient due for hemodialysis tomorrow.  Orders to be prepared.    #. Anemia of CKD  Lab Results  Component Value Date   HGB 9.2 (L) 05/28/2019  Hemoglobin currently 9.2 and close to target.  Continue to monitor.   #. SHPTH  No results found for: PTH Lab Results  Component Value Date   PHOS 2.8 05/29/2019   Check serum phosphorus during dialysis treatment tomorrow.   #. Diabetes type 2 with CKD Hgb A1c MFr Bld (%)  Date Value  04/26/2019 5.8 (H)   #Weakness Chest x-ray shows cardiomegaly, opacity at left lung base - possible pneumonia. IV antibiotics as per internal medicine team    LOS: 4 Nakota Ackert 12/14/20204:47 PM  [facility] [place], [place]

## 2019-06-01 DIAGNOSIS — E440 Moderate protein-calorie malnutrition: Secondary | ICD-10-CM | POA: Insufficient documentation

## 2019-06-01 LAB — CULTURE, BLOOD (ROUTINE X 2)
Culture: NO GROWTH
Culture: NO GROWTH
Special Requests: ADEQUATE
Special Requests: ADEQUATE

## 2019-06-01 MED ORDER — LOSARTAN POTASSIUM 50 MG PO TABS: 100.0000 mg | ORAL_TABLET | Freq: Every day | ORAL | Status: DC

## 2019-06-01 MED ORDER — ACETAMINOPHEN 325 MG PO TABS: 650.0000 mg | ORAL_TABLET | Freq: Four times a day (QID) | ORAL | 2 refills | Status: AC | PRN

## 2019-06-01 MED ORDER — HYDRALAZINE HCL 25 MG PO TABS: 25.0000 mg | ORAL_TABLET | Freq: Three times a day (TID) | ORAL | 1 refills | Status: AC

## 2019-06-01 MED ORDER — LOSARTAN POTASSIUM 100 MG PO TABS: 100.0000 mg | ORAL_TABLET | Freq: Every day | ORAL | 0 refills | Status: AC

## 2019-06-01 NOTE — Progress Notes (Addendum)
Update 2:07PM: patient set to discharge home today with hospice services at home provided by [facility]. MD and RN notified.   Home hospice referral completed for [facility] with Venia Carbon. Will continue to update.   David Spittle, LCSW [facility]  [phone]

## 2019-06-01 NOTE — Progress Notes (Signed)
PT Cancellation Note  Patient Details Name: Macai Mulroy MRN: [id number] DOB: [birth year]   Cancelled Treatment:    Reason Eval/Treat Not Completed: Other (comment). Per discussion with OT, pt lethargic, unable to participate in therapy this date. Will re-attempt at another time.   Tahnee Cifuentes 06/01/2019, 12:59 PM  Greggory Stallion, PT, DPT [phone]

## 2019-06-01 NOTE — Progress Notes (Addendum)
Daily Progress Note   Patient Name: [name]       Date: 06/01/2019 DOB: 11-12-1950  Age: 68 y.o. MRN#: [id number] Attending Physician: Lorella Nimrod, MD Primary Care Physician: Kirk Ruths, MD Admit Date: 05/27/2019  Reason for Consultation/Follow-up: Establishing goals of care  Subjective: Patient is resting in bed. His eyes are closed. Daughter is at bedside. Patient's wife and son were on her camera phone. Patient's wife speaks [place] and states "no I do not want an interpretor, I would like to use my children if I need an interpretor". The family states his QOL over the past 6 months has been declining.   We discussed his diagnosis, prognosis, GOC, EOL wishes disposition and options.  A detailed discussion was had today regarding advanced directives.  Concepts specific to code status, artifical feeding and hydration, IV antibiotics and rehospitalization were discussed.  The difference between an aggressive medical intervention path and a comfort care path was discussed.  Values and goals of care important to patient and family were attempted to be elicited.  Discussed limitations of medical interventions to prolong quality of life in some situations and discussed the concept of human mortality.  The family states he would never want a feeding tube. Wife states he told her "if I'm not able to do anything, let me go, don't put tubes in me."   I completed a MOST form today after goals were discussed with patient's wife daughter, and son, and the signed original signed by daughter who was at bedside was placed in the chart. A photocopy was placed in the chart to be scanned into EMR. The patient outlined their wishes for the following treatment decisions:  Cardiopulmonary  Resuscitation: Do Not Attempt Resuscitation (DNR/No CPR)  Medical Interventions: Comfort Measures: Keep clean, warm, and dry. Use medication by any route, positioning, wound care, and other measures to relieve pain and suffering. Use oxygen, suction and manual treatment of airway obstruction as needed for comfort. Do not transfer to the hospital unless comfort needs cannot be met in current location.  Antibiotics: No antibiotics (use other measures to relieve symptoms)  IV Fluids: No IV fluids (provide other measures to ensure comfort)  Feeding Tube: No feeding tube   Length of Stay: 5  Current Medications: Scheduled Meds:  . allopurinol  100 mg Oral  Daily  . aspirin EC  81 mg Oral Daily  . atorvastatin  80 mg Oral Daily  . calcium acetate  1,334 mg Oral TID WC  . Chlorhexidine Gluconate Cloth  6 each Topical Q0600  . clopidogrel  75 mg Oral Q breakfast  . epoetin (EPOGEN/PROCRIT) injection  4,000 Units Intravenous Q T,Th,Sa-HD  . feeding supplement (NEPRO CARB STEADY)  237 mL Oral TID BM  . heparin  5,000 Units Subcutaneous Q8H  . hydrALAZINE  25 mg Oral Q8H  . levETIRAcetam  500 mg Oral Daily  . losartan  100 mg Oral Daily  . mouth rinse  15 mL Mouth Rinse BID  . memantine  5 mg Oral Daily  . metoprolol succinate  12.5 mg Oral Once per day on Tue Thu Sat  . metoprolol succinate  25 mg Oral Once per day on Sun Mon Wed Fri  . mirtazapine  15 mg Oral QHS  . multivitamin  1 tablet Oral QHS  . sodium chloride flush  3 mL Intravenous Q12H    Continuous Infusions: . ampicillin-sulbactam (UNASYN) IV 3 g (05/31/19 1623)    PRN Meds: acetaminophen **OR** acetaminophen, labetalol, ondansetron **OR** ondansetron (ZOFRAN) IV  Physical Exam Constitutional:      Comments: Opens eyes briefly.   Skin:    General: Skin is warm and dry.             Vital Signs: BP (!) 184/77   Pulse 83   Temp 99.1 F (37.3 C)   Resp 16   Ht '5\' 4"'$  (1.626 m)   Wt 69.8 kg   SpO2 98%   BMI 26.41  kg/m  SpO2: SpO2: 98 % O2 Device: O2 Device: Room Air O2 Flow Rate:    Intake/output summary:   Intake/Output Summary (Last 24 hours) at 06/01/2019 1333 Last data filed at 06/01/2019 0900 Gross per 24 hour  Intake 3 ml  Output --  Net 3 ml   LBM: Last BM Date: (umknown) Baseline Weight: Weight: 73.5 kg Most recent weight: Weight: 69.8 kg       Palliative Assessment/Data: 10%      Patient Active Problem List   Diagnosis Date Noted  . Malnutrition of moderate degree 06/01/2019  . Generalized weakness   . FTT (failure to thrive) in adult 05/27/2019  . Dysphagia 05/27/2019  . History of CVA (cerebrovascular accident) 05/27/2019  . Dementia ([place]) 05/01/2019  . Seizure ([place]) 05/01/2019  . Acute metabolic encephalopathy 56/81/2751  . Pneumonia 04/26/2019  . Community acquired pneumonia 11/13/2018  . CVA (cerebral vascular accident) ([place])   . Palliative care by specialist   . Seizure disorder ([place]) 02/19/2018  . Calculus of common duct   . Fitting and adjustment of gastrointestinal appliance and device   . Elevated LFTs 09/16/2016  . Calculus of bile duct without cholecystitis with obstruction   . Choledocholithiasis   . Diabetes ([place]) 09/04/2016  . HTN (hypertension) 09/04/2016  . GERD (gastroesophageal reflux disease) 09/04/2016  . Chronic diastolic CHF (congestive heart failure) ([place]) 09/04/2016  . Transaminitis 09/04/2016  . Sepsis ([place]) 09/04/2016  . CAP (community acquired pneumonia) 09/04/2016  . Encephalopathy acute   . HCAP (healthcare-associated pneumonia) 07/24/2016  . Bradycardia 06/25/2016  . Alteration in self-care ability 03/05/2016  . Hx of subdural hematoma 03/05/2016  . 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 NSTEMI (non-ST elevated myocardial infarction) ([place]) 02/26/2016  . H/O non-ST elevation myocardial infarction (NSTEMI) 01/10/2016  . S/P coronary artery stent placement 01/10/2016  .  Depression, major, single episode, complete remission ([place]) 07/22/2015   . Acute respiratory failure with hypoxia ([place]) 02/14/2015  . Syncope 11/05/2014  . Acute cystitis with hematuria 10/11/2014  . Acute subdural hematoma ([place]) 10/10/2014  . Anemia in chronic kidney disease (CKD) 10/07/2014  . ESRD on hemodialysis ([place]) 10/07/2014  . Mixed hyperlipidemia 10/07/2014  . 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 Contaminant given to patient   . Shortness of breath   . Viridans streptococci infection   . Polymicrobial bacterial infection   . Bacteremia   . 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 ESRD on dialysis ([place])   . 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 Acute coronary syndrome ([place]) 09/29/2014  . Anemia associated with chronic renal failure 03/25/2014  . Nausea & vomiting 03/25/2014  . Gout 03/17/2012  . H/O: upper GI bleed 03/17/2012  . Hyperlipidemia, unspecified 03/17/2012  . Kidney transplant status, cadaveric 07/16/2002    Palliative Care Assessment & Plan    Recommendations/Plan:  Home with hospice.   Patient made DNR prior to consult for palliative medicine.    Code Status:    Code Status Orders  (From admission, onward)         Start     Ordered   05/27/19 1114  Do not attempt resuscitation (DNR)  Continuous    Question Answer Comment  In the event of cardiac or respiratory ARREST Do not call a "code blue"   In the event of cardiac or respiratory ARREST Do not perform Intubation, CPR, defibrillation or ACLS   In the event of cardiac or respiratory ARREST Use medication by any route, position, wound care, and other measures to relive pain and suffering. May use oxygen, suction and manual treatment of airway obstruction as needed for comfort.   Comments discussed with family, confirmed by mother and son Nicole Kindred      05/27/19 1115        Code Status History    Date Active Date Inactive Code Status Order ID Comments User Context   05/01/2019 1509 05/02/2019  2224 DNR [id number]  Marcell Anger, MD Inpatient   04/26/2019 0325 05/01/2019 1509 Full Code [id number]  Mansy, Arvella Merles, MD ED   11/13/2018 1742 11/15/2018 1602 Full Code [id number]  Mayo, Pete Pelt, MD Inpatient   02/19/2018 1202 02/27/2018 1941 DNR [id number]  Gorden Harms, MD Inpatient   09/16/2016 0325 09/18/2016 1709 Full Code [id number]  Hugelmeyer, [place], DO Inpatient   09/04/2016 2336 09/14/2016 1909 Full Code [id number]  Lance Coon, MD Inpatient   07/24/2016 0219 07/29/2016 2015 Full Code [id number]  Hugelmeyer, Alexis, DO Inpatient   02/26/2016 0603 02/27/2016 1705 Full Code [id number]  Harrie Foreman, MD Inpatient   02/26/2016 0540 02/26/2016 0603 DNR [id number]  Harrie Foreman, MD Inpatient   02/14/2015 0816 02/17/2015 1347 DNR [id number]  Epifanio Lesches, MD ED   12/12/2014 2044 12/16/2014 1926 DNR [id number]  Loletha Grayer, MD ED   11/05/2014 2209 11/09/2014 2148 Full Code [id number]  Henreitta Leber, MD Inpatient   09/30/2014 1348 10/02/2014 1902 Full Code [id number]  [name], MD Inpatient   09/29/2014 2036 09/30/2014 1348 Full Code [id number]  [name], MD Inpatient   Advance Care Planning Activity    Advance Directive Documentation     Most Recent Value  Type of Advance Directive  Healthcare Power of Attorney  Pre-existing out of facility DNR order (yellow form or pink MOST form)  --  "  MOST" Form in Place?  --       Prognosis:   < 2 weeks Not eating and drinking. Stopping dialysis.  Discharge Planning:  Home with Hospice  Care plan was discussed with RN  Thank you for allowing the Palliative Medicine Team to assist in the care of this patient.   Time In: 12:30 Time Out: 1:57 Total Time 87 min Prolonged Time Billed  no      Greater than 50%  of this time was spent counseling and coordinating care related to the above assessment and plan.  Asencion Gowda, NP  Please contact Palliative Medicine Team phone at [phone] for questions and concerns.

## 2019-06-01 NOTE — Progress Notes (Signed)
[place], [place] 06/01/19  Subjective:  Patient seen at bedside today. He will be due for hemodialysis today. Resting comfortably at the moment.    Objective:  Vital signs in last 24 hours:  Temp:  [99.1 F (37.3 C)] 99.1 F (37.3 C) (12/15 0035) Pulse Rate:  [73-83] 83 (12/15 0443) Resp:  [16] 16 (12/15 0035) BP: (161-193)/(74-86) 184/77 (12/15 0443) SpO2:  [94 %-98 %] 98 % (12/15 0443) Weight:  [69.8 kg] 69.8 kg (12/15 0500)  Weight change:  Filed Weights   05/29/19 1438 05/30/19 0500 06/01/19 0500  Weight: 68.6 kg 68.9 kg 69.8 kg    Intake/Output:    Intake/Output Summary (Last 24 hours) at 06/01/2019 1152 Last data filed at 06/01/2019 0900 Gross per 24 hour  Intake 3 ml  Output --  Net 3 ml     Physical Exam: General: NAD  HEENT Anicteric, moist oral mucous membranes  Pulm/lungs clear bilateral, normal effort  CVS/Heart S1 and S2, no rubs  Abdomen:  Soft, nontender, bowel sounds present  Extremities: No peripheral edema  Neurologic: Awake, alert, resting comfortably  Skin: No acute rashes     Basic Metabolic Panel:  Recent Labs  Lab 05/27/19 0918 05/27/19 1613 05/28/19 1000 05/29/19 1000  NA 136  --  137  --   K 5.0  --  4.2  --   CL 96*  --  98  --   CO2 28  --  27  --   GLUCOSE 117*  --  88  --   BUN 46*  --  38*  --   CREATININE 11.24* 11.58* 9.29*  --   CALCIUM 9.4  --  8.8*  --   MG 2.0 2.1  --   --   PHOS 1.9* 2.0*  --  2.8     CBC: Recent Labs  Lab 05/27/19 0918 05/27/19 1613 05/28/19 1000  WBC 12.0* 8.9 6.4  HGB 10.6* 9.9* 9.2*  HCT 33.6* 31.8* 29.8*  MCV 73.0* 74.1* 74.9*  PLT 175 171 156      Lab Results  Component Value Date   HEPBSAG NON REACTIVE 05/01/2019   HEPBSAB Reactive (A) 05/01/2019   HEPBIGM NON REACTIVE 05/01/2019      Microbiology:  Recent Results (from the past 240 hour(s))  SARS CORONAVIRUS 2 (TAT 6-24 HRS) Nasopharyngeal Nasopharyngeal Swab     Status: None    Collection Time: 05/27/19  9:54 AM   Specimen: Nasopharyngeal Swab  Result Value Ref Range Status   SARS Coronavirus 2 NEGATIVE NEGATIVE Final    Comment: (NOTE) SARS-CoV-2 target nucleic acids are NOT DETECTED. The SARS-CoV-2 RNA is generally detectable in upper and lower respiratory specimens during the acute phase of infection. Negative results do not preclude SARS-CoV-2 infection, do not rule out co-infections with other pathogens, and should not be used as the sole basis for treatment or other patient management decisions. Negative results must be combined with clinical observations, patient history, and epidemiological information. The expected result is Negative. Fact Sheet for Patients: SugarRoll.be Fact Sheet for Healthcare Providers: https://www.woods-mathews.com/ This test is not yet approved or cleared by the [place] FDA and  has been authorized for detection and/or diagnosis of SARS-CoV-2 by FDA under an Emergency Use Authorization (EUA). This EUA will remain  in effect (meaning this test can be used) for the duration of the COVID-19 declaration under Section 56 4(b)(1) of the Act, 21 U.S.C. section 360bbb-3(b)(1), unless the authorization is terminated or revoked sooner.  Performed at [facility], [place] [address] [place], [place] [address]   Culture, blood (routine x 2)     Status: None   Collection Time: 05/27/19  4:13 PM   Specimen: BLOOD  Result Value Ref Range Status   Specimen Description BLOOD BLOOD RIGHT HAND  Final   Special Requests   Final    BOTTLES DRAWN AEROBIC AND ANAEROBIC Blood Culture adequate volume   Culture   Final    NO GROWTH 5 DAYS Performed at [facility], [address], [place], [place] [address]    Report Status 06/01/2019 FINAL  Final  Culture, blood (routine x 2)     Status: None   Collection Time: 05/27/19  4:34 PM   Specimen: BLOOD  Result Value Ref Range Status    Specimen Description BLOOD RIGHT ANTECUBITAL  Final   Special Requests   Final    BOTTLES DRAWN AEROBIC AND ANAEROBIC Blood Culture adequate volume   Culture   Final    NO GROWTH 5 DAYS Performed at [facility], [address], [place],  [address]    Report Status 06/01/2019 FINAL  Final    Coagulation Studies: No results for input(s): LABPROT, INR in the last 72 hours.  Urinalysis: No results for input(s): COLORURINE, LABSPEC, PHURINE, GLUCOSEU, HGBUR, BILIRUBINUR, KETONESUR, PROTEINUR, UROBILINOGEN, NITRITE, LEUKOCYTESUR in the last 72 hours.  Invalid input(s): APPERANCEUR    Imaging: No results found.   Medications:   . ampicillin-sulbactam (UNASYN) IV 3 g (05/31/19 1623)   . allopurinol  100 mg Oral Daily  . aspirin EC  81 mg Oral Daily  . atorvastatin  80 mg Oral Daily  . calcium acetate  1,334 mg Oral TID WC  . Chlorhexidine Gluconate Cloth  6 each Topical Q0600  . clopidogrel  75 mg Oral Q breakfast  . epoetin (EPOGEN/PROCRIT) injection  4,000 Units Intravenous Q T,Th,Sa-HD  . feeding supplement (NEPRO CARB STEADY)  237 mL Oral TID BM  . heparin  5,000 Units Subcutaneous Q8H  . hydrALAZINE  25 mg Oral Q8H  . levETIRAcetam  500 mg Oral Daily  . losartan  100 mg Oral Daily  . mouth rinse  15 mL Mouth Rinse BID  . memantine  5 mg Oral Daily  . metoprolol succinate  12.5 mg Oral Once per day on Tue Thu Sat  . metoprolol succinate  25 mg Oral Once per day on Sun Mon Wed Fri  . mirtazapine  15 mg Oral QHS  . multivitamin  1 tablet Oral QHS  . sodium chloride flush  3 mL Intravenous Q12H   acetaminophen **OR** acetaminophen, labetalol, ondansetron **OR** ondansetron (ZOFRAN) IV  Assessment/ Plan:  68 y.o. Asian ([place])  male with end stage renal disease on hemodialysis, history of renal transplant, hypertension, diabetes mellitus type II, peptic ulcer disease, gout, diastolic congestive heart failure.   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 (HCC)   HTN (hypertension)   Anemia associated with chronic renal failure   Mixed hyperlipidemia   Seizure disorder (HCC)   Dementia (HCC)   FTT (failure to thrive) in adult   Dysphagia   History of CVA (cerebrovascular accident)   Malnutrition of moderate degree  PRD N18.6 End stage renal disease Modality In-Center Hemodialysis Shift [facility]ThSa-1 [facility] Start Date 03/15/2014 FDODE 05/22/1995 Metrics Height 149.9 cm / 59 inches Last Tx Weight 68.1 kgs /  150 lbs BMI 30.32 Access Primary Access AV Graft Site Arm (Left lower) Placement Date 02/16/12 Treatment Details Time 195 Minutes Target Weight 68.5 Kg [facility] [facility]/   Results for XIAN, LIEBAU (MRN [id number]) as of 05/27/2019 11:19  Ref. Range 05/01/2019 14:00  Hep B S Ab Latest Ref Range: NON REACTIVE  Reactive (A)    #. ESRD, TTS Patient due for hemodialysis today.  Orders have been prepared.  Continue dialysis on TTS schedule.    #. Anemia of CKD  Lab Results  Component Value Date   HGB 9.2 (L) 05/28/2019  Continue Epogen 4000 IV with dialysis treatments.   #. SHPTH  No results found for: PTH Lab Results  Component Value Date   PHOS 2.8 05/29/2019   Check serum phosphorus during dialysis treatment today.  Maintain the patient on calcium acetate 2 tablets p.o. 3 times daily with meals.   #. Diabetes type 2 with CKD Hgb A1c MFr Bld (%)  Date Value  04/26/2019 5.8 (H)   #Weakness Chest x-ray shows cardiomegaly, opacity at left lung base - possible pneumonia. Patient currently on Unasyn.    LOS: 5 Yicel Shannon 12/15/202011:[address] [place], [place]

## 2019-06-01 NOTE — Plan of Care (Signed)

## 2019-06-01 NOTE — Discharge Summary (Signed)
Physician Discharge Summary  [name] [id number] DOB: 1951-05-22 DOA: 05/27/2019  PCP: Kirk Ruths, MD  Admit date: 05/27/2019 Discharge date: 06/01/2019  Admitted From: Home Disposition: Home with hospice.  Recommendations for Outpatient Follow-up:  1. 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 Patient was recently discharged on 11/15 after an admission for right lower lobe pneumonia with sepsis physiology. Due to patient's significant physical debility and recurrent encephalopathy extensive discussions were held with the family guarding placement in skilled nursing facility but they declined. It was documented that patient will be high risk for admission with significant needs at home if patient's family continue to decline SNF.  Patient presented to ED from home with generalized weakness. Having mild leukocytosis, procalcitonin of 0.42, COVID-19 negative and chest x-ray with persistent left lower lobe infiltrate.  Patient continued to remain very lethargic.  Just open eyes on calling his name and does not follow any other commands.  Because of persistent left lower lobe infiltrate there was some concern for obstruction/aspiration.  He was not a good candidate for further aggressive measures for definitive diagnosis or treatment due to his very poor baseline functional status.  Patient was bedbound at home requiring multiple family members to help with ADLs. He was initially treated with Zosyn which was converted  to Unasyn. He remained afebrile with no change in his functional status. Continue to get dialysis during hospitalization, dialysis will be stopped after today's meeting. Palliative care was consulted to discuss goals of care, with the help of family meeting they decided to take him back home with hospice care. Patient has very poor prognosis. He is being discharged home with hospice at family's request.  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 ([place])   HTN (hypertension)   Anemia associated with chronic renal failure   Mixed hyperlipidemia   Seizure disorder (HCC)   Dementia (HCC)   FTT (failure to thrive) in adult   Dysphagia   History of CVA (cerebrovascular accident)   Malnutrition of moderate degree  Discharge Instructions  Discharge Instructions    Diet - low sodium heart healthy   Complete by: As directed    Increase activity slowly   Complete by: As directed      Allergies as of 06/01/2019      Reactions   Ivp Dye [iodinated Diagnostic Agents] Other (See Comments)   Pt denied   Metrizamide Other (See Comments)   Contains iodine      Medication List    TAKE these medications   acetaminophen 325 MG tablet Commonly known as: TYLENOL Take 2 tablets (650 mg total) by mouth every 6 (six) hours as needed for mild pain (or Fever >/= 101).   allopurinol 100 MG tablet Commonly known as: ZYLOPRIM Take 100 mg by mouth daily.   aspirin 81 MG EC tablet Take 81 mg by mouth daily.   atorvastatin 80 MG tablet Commonly known as: LIPITOR Take 80 mg by mouth daily.   calcium acetate 667 MG capsule Commonly known  as: PHOSLO Take 1,334 mg by mouth 3 (three) times daily with meals.   clopidogrel 75 MG tablet Commonly known as: PLAVIX Take 1 tablet (75 mg total) by mouth daily with breakfast.   hydrALAZINE 25 MG tablet Commonly known as:  APRESOLINE Take 1 tablet (25 mg total) by mouth every 8 (eight) hours.   levETIRAcetam 100 MG/ML solution Commonly known as: KEPPRA Take 5 mLs (500 mg total) by mouth daily.   losartan 100 MG tablet Commonly known as: COZAAR Take 1 tablet (100 mg total) by mouth daily. What changed:   medication strength  how much to take   memantine 5 MG tablet Commonly known as: NAMENDA Take 5 mg by mouth daily.   metoprolol succinate 25 MG 24 hr tablet Commonly known as: TOPROL-XL Take 12.5-25 mg by mouth See admin instructions. Take 25 mg by mouth on non-dialysis days and 12.5 mg on dialysis days (Tuesday, Thursday and Saturday) after dialysis.   mirtazapine 15 MG tablet Commonly known as: REMERON Take 15 mg by mouth at bedtime.   nitroGLYCERIN 0.4 MG SL tablet Commonly known as: NITROSTAT Place 1 tablet (0.4 mg total) under the tongue every 5 (five) minutes x 3 doses as needed for chest pain.       Allergies  Allergen Reactions  . Ivp Dye [Iodinated Diagnostic Agents] Other (See Comments)    Pt denied  . Metrizamide Other (See Comments)    Contains iodine    Consultations:  Nephrology  Palliative care  Procedures/Studies: CT Head Wo Contrast  Result Date: 05/27/2019 CLINICAL DATA:  Weakness. EXAM: CT HEAD WITHOUT CONTRAST TECHNIQUE: Contiguous axial images were obtained from the base of the skull through the vertex without intravenous contrast. COMPARISON:  April 30, 2019. FINDINGS: Brain: Mild diffuse cortical atrophy is noted. Mild chronic ischemic white matter disease is noted. No mass effect or midline shift is noted. Ventricular size is within normal limits. There is no evidence of mass lesion, hemorrhage or acute infarction. Vascular: No hyperdense vessel or unexpected calcification. Skull: Normal. Negative for fracture or focal lesion. Sinuses/Orbits: No acute finding. Other: None. IMPRESSION: Mild diffuse cortical atrophy. Mild chronic ischemic white matter  disease. No acute intracranial abnormality seen. Electronically Signed   By: Marijo Conception M.D.   On: 05/27/2019 08:51   DG Chest Port 1 View  Result Date: 05/27/2019 CLINICAL DATA:  Weakness. EXAM: PORTABLE CHEST 1 VIEW COMPARISON:  Chest radiograph 04/30/2019 FINDINGS: Mild cardiomegaly.  Sequela of prior median sternotomy. Opacity at the left lung base may reflect atelectasis, pneumonia and/or effusion. The right lung is clear. No evidence of pneumothorax. No acute bony abnormality Overlying cardiac monitoring leads. Partially visualized vascular stent within the left axilla. IMPRESSION: Mild cardiomegaly. Opacity at the left lung base may reflect atelectasis, pneumonia and/or effusion. Electronically Signed   By: Kellie Simmering DO   On: 05/27/2019 07:39    Subjective: Patient remained very lethargic.  Not eating or drinking anything.  Does not follow any command.  Discharge Exam: Vitals:   06/01/19 0443 06/01/19 0443  BP: (!) 184/77 (!) 184/77  Pulse:  83  Resp:    Temp:    SpO2:  98%   Vitals:   06/01/19 0150 06/01/19 0443 06/01/19 0443 06/01/19 0500  BP: (!) 176/74 (!) 184/77 (!) 184/77   Pulse: 81  83   Resp:      Temp:      TempSrc:      SpO2: 94%  98%   Weight:  69.8 kg  Height:        General: Chronically ill-appearing gentleman, lethargic, not following any command, not in acute distress Cardiovascular: RRR, S1/S2 +, no rubs, no gallops Respiratory: CTA bilaterally, no wheezing, no rhonchi Abdominal: Soft, NT, ND, bowel sounds + Extremities: no edema, no cyanosis   The results of significant diagnostics from this hospitalization (including imaging, microbiology, ancillary and laboratory) are listed below for reference.    Microbiology: Recent Results (from the past 240 hour(s))  SARS CORONAVIRUS 2 (TAT 6-24 HRS) Nasopharyngeal Nasopharyngeal Swab     Status: None   Collection Time: 05/27/19  9:54 AM   Specimen: Nasopharyngeal Swab  Result Value Ref Range  Status   SARS Coronavirus 2 NEGATIVE NEGATIVE Final    Comment: (NOTE) SARS-CoV-2 target nucleic acids are NOT DETECTED. The SARS-CoV-2 RNA is generally detectable in upper and lower respiratory specimens during the acute phase of infection. Negative results do not preclude SARS-CoV-2 infection, do not rule out co-infections with other pathogens, and should not be used as the sole basis for treatment or other patient management decisions. Negative results must be combined with clinical observations, patient history, and epidemiological information. The expected result is Negative. Fact Sheet for Patients: SugarRoll.be Fact Sheet for Healthcare Providers: https://www.woods-mathews.com/ This test is not yet approved or cleared by the [place] FDA and  has been authorized for detection and/or diagnosis of SARS-CoV-2 by FDA under an Emergency Use Authorization (EUA). This EUA will remain  in effect (meaning this test can be used) for the duration of the COVID-19 declaration under Section 56 4(b)(1) of the Act, 21 U.S.C. section 360bbb-3(b)(1), unless the authorization is terminated or revoked sooner. Performed at [facility], [place] [address], [place], [place] [address]   Culture, blood (routine x 2)     Status: None   Collection Time: 05/27/19  4:13 PM   Specimen: BLOOD  Result Value Ref Range Status   Specimen Description BLOOD BLOOD RIGHT HAND  Final   Special Requests   Final    BOTTLES DRAWN AEROBIC AND ANAEROBIC Blood Culture adequate volume   Culture   Final    NO GROWTH 5 DAYS Performed at [facility], [address] [place], [place] [address]    Report Status 06/01/2019 FINAL  Final  Culture, blood (routine x 2)     Status: None   Collection Time: 05/27/19  4:34 PM   Specimen: BLOOD  Result Value Ref Range Status   Specimen Description BLOOD RIGHT ANTECUBITAL  Final   Special Requests   Final    BOTTLES  DRAWN AEROBIC AND ANAEROBIC Blood Culture adequate volume   Culture   Final    NO GROWTH 5 DAYS Performed at [facility], [address] [place], [place] [address]    Report Status 06/01/2019 FINAL  Final     Labs: BNP (last 3 results) Recent Labs    11/13/18 1054 05/27/19 1613  BNP 163.0* 123XX123*   Basic Metabolic Panel: Recent Labs  Lab 05/27/19 0918 05/27/19 1613 05/28/19 1000 05/29/19 1000  NA 136  --  137  --   K 5.0  --  4.2  --   CL 96*  --  98  --   CO2 28  --  27  --   GLUCOSE 117*  --  88  --   BUN 46*  --  38*  --   CREATININE 11.24* 11.58* 9.29*  --   CALCIUM 9.4  --  8.8*  --   MG 2.0 2.1  --   --   PHOS 1.9* 2.0*  --  2.8   Liver Function Tests: Recent Labs  Lab 05/28/19 1000  AST 15  ALT 10  ALKPHOS 63  BILITOT 0.7  PROT 6.9  ALBUMIN 2.9*   No results for input(s): LIPASE, AMYLASE in the last 168 hours. No results for input(s): AMMONIA in the last 168 hours. CBC: Recent Labs  Lab 05/27/19 0918 05/27/19 1613 05/28/19 1000  WBC 12.0* 8.9 6.4  HGB 10.6* 9.9* 9.2*  HCT 33.6* 31.8* 29.8*  MCV 73.0* 74.1* 74.9*  PLT 175 171 156   Cardiac Enzymes: No results for input(s): CKTOTAL, CKMB, CKMBINDEX, TROPONINI in the last 168 hours. BNP: Invalid input(s): POCBNP CBG: No results for input(s): GLUCAP in the last 168 hours. D-Dimer No results for input(s): DDIMER in the last 72 hours. Hgb A1c No results for input(s): HGBA1C in the last 72 hours. Lipid Profile No results for input(s): CHOL, HDL, LDLCALC, TRIG, CHOLHDL, LDLDIRECT in the last 72 hours. Thyroid function studies No results for input(s): TSH, T4TOTAL, T3FREE, THYROIDAB in the last 72 hours.  Invalid input(s): FREET3 Anemia work up No results for input(s): VITAMINB12, FOLATE, FERRITIN, TIBC, IRON, RETICCTPCT in the last 72 hours. Urinalysis    Component Value Date/Time   COLORURINE Straw 02/28/2014 1326   APPEARANCEUR Clear 02/28/2014 1326   LABSPEC 1.010  02/28/2014 1326   PHURINE 5.0 02/28/2014 1326   GLUCOSEU Negative 02/28/2014 1326   HGBUR 1+ 02/28/2014 1326   BILIRUBINUR Negative 02/28/2014 1326   KETONESUR Negative 02/28/2014 1326   PROTEINUR Negative 02/28/2014 1326   NITRITE Negative 02/28/2014 1326   LEUKOCYTESUR Negative 02/28/2014 1326   Sepsis Labs Invalid input(s): PROCALCITONIN,  WBC,  LACTICIDVEN Microbiology Recent Results (from the past 240 hour(s))  SARS CORONAVIRUS 2 (TAT 6-24 HRS) Nasopharyngeal Nasopharyngeal Swab     Status: None   Collection Time: 05/27/19  9:54 AM   Specimen: Nasopharyngeal Swab  Result Value Ref Range Status   SARS Coronavirus 2 NEGATIVE NEGATIVE Final    Comment: (NOTE) SARS-CoV-2 target nucleic acids are NOT DETECTED. The SARS-CoV-2 RNA is generally detectable in upper and lower respiratory specimens during the acute phase of infection. Negative results do not preclude SARS-CoV-2 infection, do not rule out co-infections with other pathogens, and should not be used as the sole basis for treatment or other patient management decisions. Negative results must be combined with clinical observations, patient history, and epidemiological information. The expected result is Negative. Fact Sheet for Patients: SugarRoll.be Fact Sheet for Healthcare Providers: https://www.woods-mathews.com/ This test is not yet approved or cleared by the [place] FDA and  has been authorized for detection and/or diagnosis of SARS-CoV-2 by FDA under an Emergency Use Authorization (EUA). This EUA will remain  in effect (meaning this test can be used) for the duration of the COVID-19 declaration under Section 56 4(b)(1) of the Act, 21 U.S.C. section 360bbb-3(b)(1), unless the authorization is terminated or revoked sooner. Performed at [facility], [place] [address] [place], [place] [address]   Culture, blood (routine x 2)     Status: None   Collection Time:  05/27/19  4:13 PM   Specimen: BLOOD  Result Value Ref Range Status   Specimen Description BLOOD BLOOD RIGHT HAND  Final   Special Requests   Final    BOTTLES DRAWN AEROBIC AND ANAEROBIC Blood Culture adequate volume   Culture   Final  NO GROWTH 5 DAYS Performed at [facility], [address] [place], [place] [address]    Report Status 06/01/2019 FINAL  Final  Culture, blood (routine x 2)     Status: None   Collection Time: 05/27/19  4:34 PM   Specimen: BLOOD  Result Value Ref Range Status   Specimen Description BLOOD RIGHT ANTECUBITAL  Final   Special Requests   Final    BOTTLES DRAWN AEROBIC AND ANAEROBIC Blood Culture adequate volume   Culture   Final    NO GROWTH 5 DAYS Performed at [facility], [address], [place], [place] [address]    Report Status 06/01/2019 FINAL  Final    Time coordinating discharge: Over 30 minutes  SIGNED:  Lorella Nimrod, MD  [facility] 06/01/2019, 1:55 PM Pager [phone]  If 7PM-7AM, please contact night-coverage [email or web address] Password [email or web address]  This record has been created using Systems analyst. Errors have been sought and corrected,but may not always be located. Such creation errors do not reflect on the standard of care.

## 2019-06-01 NOTE — Progress Notes (Signed)
Pt discharged home via ems  At this time with hospice care.  dtr at bedside sl d/cd. ccath  Removed,pt not having any  Urine. Dialysis pt.

## 2019-06-01 NOTE — Progress Notes (Signed)
New referral for [facility] hospice services at home received from [place]. Writer spoke in the room with patient's daughter [name] to initiate education regarding hospice services, philosophy and team approach to care with understanding voiced. Per [name] patient does not need any DME ion place prior to discharge, he already has a hospital bed Family wishes for patient to be discharged home today via EMS. Signed out of facility DNR and MOST form in place. Hospital care team updated. Patient information faxed to referral. Flo Shanks BSN, RN, [place] [phone]

## 2019-06-01 NOTE — Progress Notes (Signed)
OT Cancellation Note  Patient Details Name: [name] MRN: [id number] DOB: [birth year]   Cancelled Treatment:    Reason Eval/Treat Not Completed: Fatigue/lethargy limiting ability to participate  When OT presents for evaluation, pt barely opens eyes to attend and with very limited wakefulness, immediately falling asleep after being stimulated to wake up. Pt's daughter present in room and reports that pt and family meeting with palliative this date. Will withhold until  updated on pt and family goals of care.   Gerrianne Scale, [place], OTR/L ascom [phone] 06/01/19, 12:27 PM

## 2019-06-01 NOTE — Progress Notes (Signed)
HD Tx cancellation per HD coordinator Elvera Bicker.

## 2019-06-18 DEATH — deceased
